# Patient Record
Sex: Female | Born: 1950 | Race: Black or African American | Hispanic: No | Smoking: Current every day smoker
Health system: Southern US, Community
[De-identification: ages and names within clinical notes are randomized; demographics above are authoritative.]

## PROBLEM LIST (undated history)

## (undated) DIAGNOSIS — Z86010 Personal history of colonic polyps: Secondary | ICD-10-CM

## (undated) DIAGNOSIS — R7881 Bacteremia: Secondary | ICD-10-CM

## (undated) DIAGNOSIS — IMO0002 Reserved for concepts with insufficient information to code with codable children: Secondary | ICD-10-CM

## (undated) DIAGNOSIS — C801 Malignant (primary) neoplasm, unspecified: Secondary | ICD-10-CM

## (undated) DIAGNOSIS — I10 Essential (primary) hypertension: Secondary | ICD-10-CM

## (undated) DIAGNOSIS — F1911 Other psychoactive substance abuse, in remission: Secondary | ICD-10-CM

## (undated) DIAGNOSIS — K922 Gastrointestinal hemorrhage, unspecified: Secondary | ICD-10-CM

## (undated) DIAGNOSIS — M549 Dorsalgia, unspecified: Secondary | ICD-10-CM

## (undated) DIAGNOSIS — M1990 Unspecified osteoarthritis, unspecified site: Secondary | ICD-10-CM

## (undated) DIAGNOSIS — D62 Acute posthemorrhagic anemia: Secondary | ICD-10-CM

## (undated) DIAGNOSIS — B961 Klebsiella pneumoniae [K. pneumoniae] as the cause of diseases classified elsewhere: Secondary | ICD-10-CM

## (undated) DIAGNOSIS — K859 Acute pancreatitis without necrosis or infection, unspecified: Secondary | ICD-10-CM

## (undated) DIAGNOSIS — I81 Portal vein thrombosis: Secondary | ICD-10-CM

## (undated) DIAGNOSIS — F329 Major depressive disorder, single episode, unspecified: Secondary | ICD-10-CM

## (undated) DIAGNOSIS — R578 Other shock: Secondary | ICD-10-CM

## (undated) DIAGNOSIS — D696 Thrombocytopenia, unspecified: Secondary | ICD-10-CM

## (undated) DIAGNOSIS — M810 Age-related osteoporosis without current pathological fracture: Secondary | ICD-10-CM

## (undated) DIAGNOSIS — F32A Depression, unspecified: Secondary | ICD-10-CM

## (undated) DIAGNOSIS — IMO0001 Reserved for inherently not codable concepts without codable children: Secondary | ICD-10-CM

## (undated) DIAGNOSIS — J9601 Acute respiratory failure with hypoxia: Secondary | ICD-10-CM

## (undated) HISTORY — DX: Other psychoactive substance abuse, in remission: F19.11

## (undated) HISTORY — DX: Dorsalgia, unspecified: M54.9

## (undated) HISTORY — PX: LUMBAR EPIDURAL INJECTION: SHX1980

## (undated) HISTORY — DX: Depression, unspecified: F32.A

## (undated) HISTORY — DX: Essential (primary) hypertension: I10

## (undated) HISTORY — DX: Reserved for concepts with insufficient information to code with codable children: IMO0002

## (undated) HISTORY — DX: Reserved for inherently not codable concepts without codable children: IMO0001

## (undated) HISTORY — DX: Unspecified osteoarthritis, unspecified site: M19.90

## (undated) HISTORY — DX: Age-related osteoporosis without current pathological fracture: M81.0

## (undated) HISTORY — DX: Major depressive disorder, single episode, unspecified: F32.9

## (undated) HISTORY — DX: Personal history of colonic polyps: Z86.010

## (undated) HISTORY — PX: COLONOSCOPY: SHX174

---

## 1998-07-25 ENCOUNTER — Encounter: Payer: Self-pay | Admitting: Emergency Medicine

## 1998-07-25 ENCOUNTER — Emergency Department (HOSPITAL_COMMUNITY): Admission: EM | Admit: 1998-07-25 | Discharge: 1998-07-25 | Payer: Self-pay | Admitting: Emergency Medicine

## 1999-08-08 ENCOUNTER — Emergency Department (HOSPITAL_COMMUNITY): Admission: EM | Admit: 1999-08-08 | Discharge: 1999-08-08 | Payer: Self-pay | Admitting: Emergency Medicine

## 1999-08-17 ENCOUNTER — Emergency Department (HOSPITAL_COMMUNITY): Admission: EM | Admit: 1999-08-17 | Discharge: 1999-08-17 | Payer: Self-pay | Admitting: Emergency Medicine

## 1999-08-17 ENCOUNTER — Encounter: Payer: Self-pay | Admitting: Emergency Medicine

## 2001-11-24 ENCOUNTER — Emergency Department (HOSPITAL_COMMUNITY): Admission: EM | Admit: 2001-11-24 | Discharge: 2001-11-24 | Payer: Self-pay | Admitting: Emergency Medicine

## 2005-10-27 ENCOUNTER — Emergency Department (HOSPITAL_COMMUNITY): Admission: EM | Admit: 2005-10-27 | Discharge: 2005-10-28 | Payer: Self-pay | Admitting: Emergency Medicine

## 2005-11-24 ENCOUNTER — Emergency Department (HOSPITAL_COMMUNITY): Admission: EM | Admit: 2005-11-24 | Discharge: 2005-11-24 | Payer: Self-pay | Admitting: Emergency Medicine

## 2007-05-03 ENCOUNTER — Ambulatory Visit: Payer: Self-pay | Admitting: Internal Medicine

## 2007-05-05 ENCOUNTER — Ambulatory Visit: Payer: Self-pay | Admitting: Internal Medicine

## 2007-06-09 ENCOUNTER — Encounter (INDEPENDENT_AMBULATORY_CARE_PROVIDER_SITE_OTHER): Payer: Self-pay | Admitting: Nurse Practitioner

## 2007-06-09 ENCOUNTER — Ambulatory Visit: Payer: Self-pay | Admitting: Internal Medicine

## 2007-06-09 LAB — CONVERTED CEMR LAB
ALT: 33 units/L (ref 0–35)
Albumin: 4.1 g/dL (ref 3.5–5.2)
Basophils Absolute: 0 10*3/uL (ref 0.0–0.1)
Chloride: 106 meq/L (ref 96–112)
Creatinine, Ser: 0.83 mg/dL (ref 0.40–1.20)
Eosinophils Absolute: 0.1 10*3/uL (ref 0.0–0.7)
Eosinophils Relative: 2 % (ref 0–5)
Glucose, Bld: 96 mg/dL (ref 70–99)
Hemoglobin: 13.2 g/dL (ref 12.0–15.0)
LDL Cholesterol: 69 mg/dL (ref 0–99)
Lymphocytes Relative: 50 % — ABNORMAL HIGH (ref 12–46)
Lymphs Abs: 2 10*3/uL (ref 0.7–3.3)
MCV: 96.7 fL (ref 78.0–100.0)
Monocytes Relative: 7 % (ref 3–11)
Neutro Abs: 1.7 10*3/uL (ref 1.7–7.7)
Platelets: 202 10*3/uL (ref 150–400)
Potassium: 4.4 meq/L (ref 3.5–5.3)
Total CHOL/HDL Ratio: 2.5
Triglycerides: 135 mg/dL (ref <150)
WBC: 4.1 10*3/uL (ref 4.0–10.5)

## 2007-07-19 ENCOUNTER — Emergency Department (HOSPITAL_COMMUNITY): Admission: EM | Admit: 2007-07-19 | Discharge: 2007-07-19 | Payer: Self-pay | Admitting: Emergency Medicine

## 2007-07-22 ENCOUNTER — Ambulatory Visit: Payer: Self-pay | Admitting: *Deleted

## 2007-09-09 ENCOUNTER — Ambulatory Visit: Payer: Self-pay | Admitting: Family Medicine

## 2007-09-13 ENCOUNTER — Ambulatory Visit (HOSPITAL_COMMUNITY): Admission: RE | Admit: 2007-09-13 | Discharge: 2007-09-13 | Payer: Self-pay | Admitting: Family Medicine

## 2008-04-23 ENCOUNTER — Ambulatory Visit: Payer: Self-pay | Admitting: Internal Medicine

## 2008-04-23 ENCOUNTER — Encounter (INDEPENDENT_AMBULATORY_CARE_PROVIDER_SITE_OTHER): Payer: Self-pay | Admitting: Family Medicine

## 2008-04-23 LAB — CONVERTED CEMR LAB
ALT: 32 units/L (ref 0–35)
AST: 31 units/L (ref 0–37)
BUN: 16 mg/dL (ref 6–23)
CO2: 24 meq/L (ref 19–32)
Creatinine, Ser: 0.75 mg/dL (ref 0.40–1.20)
Glucose, Bld: 90 mg/dL (ref 70–99)
Sodium: 140 meq/L (ref 135–145)
Total Protein: 7.9 g/dL (ref 6.0–8.3)

## 2008-06-28 ENCOUNTER — Ambulatory Visit: Payer: Self-pay | Admitting: Internal Medicine

## 2008-08-20 ENCOUNTER — Ambulatory Visit: Payer: Self-pay | Admitting: Internal Medicine

## 2008-08-27 ENCOUNTER — Ambulatory Visit: Payer: Self-pay | Admitting: Internal Medicine

## 2008-09-07 ENCOUNTER — Encounter (INDEPENDENT_AMBULATORY_CARE_PROVIDER_SITE_OTHER): Payer: Self-pay | Admitting: Internal Medicine

## 2008-09-07 ENCOUNTER — Ambulatory Visit: Payer: Self-pay | Admitting: Internal Medicine

## 2008-09-26 ENCOUNTER — Ambulatory Visit (HOSPITAL_COMMUNITY): Admission: RE | Admit: 2008-09-26 | Discharge: 2008-09-26 | Payer: Self-pay | Admitting: Internal Medicine

## 2008-12-29 ENCOUNTER — Emergency Department (HOSPITAL_COMMUNITY): Admission: EM | Admit: 2008-12-29 | Discharge: 2008-12-29 | Payer: Self-pay | Admitting: Emergency Medicine

## 2009-01-29 ENCOUNTER — Emergency Department (HOSPITAL_COMMUNITY): Admission: EM | Admit: 2009-01-29 | Discharge: 2009-01-29 | Payer: Self-pay | Admitting: Emergency Medicine

## 2009-02-20 ENCOUNTER — Ambulatory Visit: Payer: Self-pay | Admitting: Internal Medicine

## 2009-03-05 ENCOUNTER — Encounter: Admission: RE | Admit: 2009-03-05 | Discharge: 2009-04-03 | Payer: Self-pay | Admitting: Internal Medicine

## 2009-03-15 ENCOUNTER — Ambulatory Visit: Payer: Self-pay | Admitting: Internal Medicine

## 2009-05-29 ENCOUNTER — Ambulatory Visit: Payer: Self-pay | Admitting: Internal Medicine

## 2009-05-30 ENCOUNTER — Encounter (INDEPENDENT_AMBULATORY_CARE_PROVIDER_SITE_OTHER): Payer: Self-pay | Admitting: *Deleted

## 2009-05-30 LAB — CONVERTED CEMR LAB
ALT: 20 units/L (ref 0–35)
Basophils Absolute: 0 10*3/uL (ref 0.0–0.1)
CO2: 20 meq/L (ref 19–32)
Creatinine, Ser: 0.66 mg/dL (ref 0.40–1.20)
Eosinophils Absolute: 0.1 10*3/uL (ref 0.0–0.7)
Eosinophils Relative: 2 % (ref 0–5)
GC Probe Amp, Genital: NEGATIVE
Glucose, Bld: 84 mg/dL (ref 70–99)
HCT: 41.8 % (ref 36.0–46.0)
Hemoglobin: 13.1 g/dL (ref 12.0–15.0)
Lymphocytes Relative: 57 % — ABNORMAL HIGH (ref 12–46)
MCHC: 31.3 g/dL (ref 30.0–36.0)
Monocytes Absolute: 0.4 10*3/uL (ref 0.1–1.0)
Neutro Abs: 1.3 10*3/uL — ABNORMAL LOW (ref 1.7–7.7)
Neutrophils Relative %: 32 % — ABNORMAL LOW (ref 43–77)
RBC: 4.43 M/uL (ref 3.87–5.11)
WBC: 4.1 10*3/uL (ref 4.0–10.5)

## 2009-06-12 ENCOUNTER — Ambulatory Visit: Payer: Self-pay | Admitting: Internal Medicine

## 2009-06-12 LAB — CONVERTED CEMR LAB
HDL: 49 mg/dL (ref >39)
LDL Cholesterol: 153 mg/dL — ABNORMAL HIGH (ref 0–99)
Triglycerides: 115 mg/dL (ref <150)

## 2009-07-15 ENCOUNTER — Ambulatory Visit: Payer: Self-pay | Admitting: Internal Medicine

## 2009-09-24 ENCOUNTER — Encounter (INDEPENDENT_AMBULATORY_CARE_PROVIDER_SITE_OTHER): Payer: Self-pay | Admitting: Internal Medicine

## 2009-09-24 ENCOUNTER — Ambulatory Visit: Payer: Self-pay | Admitting: Family Medicine

## 2009-09-24 LAB — CONVERTED CEMR LAB
Aldosterone, Serum: 4
Basophils Relative: 1 % (ref 0–1)
Eosinophils Relative: 3 % (ref 0–5)
Hemoglobin: 13.5 g/dL (ref 12.0–15.0)
Lymphocytes Relative: 49 % — ABNORMAL HIGH (ref 12–46)
Lymphs Abs: 1.7 10*3/uL (ref 0.7–4.0)
MCV: 92.1 fL (ref 78.0–100.0)
Monocytes Absolute: 0.5 10*3/uL (ref 0.1–1.0)
Neutro Abs: 1.2 10*3/uL — ABNORMAL LOW (ref 1.7–7.7)
Neutrophils Relative %: 33 % — ABNORMAL LOW (ref 43–77)
RBC: 4.56 M/uL (ref 3.87–5.11)
WBC: 3.5 10*3/uL — ABNORMAL LOW (ref 4.0–10.5)

## 2009-11-25 ENCOUNTER — Ambulatory Visit: Payer: Self-pay | Admitting: Internal Medicine

## 2010-01-13 ENCOUNTER — Ambulatory Visit: Payer: Self-pay | Admitting: Internal Medicine

## 2010-02-05 ENCOUNTER — Ambulatory Visit: Payer: Self-pay | Admitting: Internal Medicine

## 2010-04-04 ENCOUNTER — Ambulatory Visit (HOSPITAL_COMMUNITY): Admission: RE | Admit: 2010-04-04 | Discharge: 2010-04-04 | Payer: Self-pay | Admitting: Internal Medicine

## 2010-05-07 ENCOUNTER — Ambulatory Visit: Payer: Self-pay | Admitting: Internal Medicine

## 2010-08-06 ENCOUNTER — Ambulatory Visit: Payer: Self-pay | Admitting: Internal Medicine

## 2010-08-18 ENCOUNTER — Ambulatory Visit (HOSPITAL_COMMUNITY): Admission: RE | Admit: 2010-08-18 | Payer: Self-pay | Admitting: Internal Medicine

## 2010-11-10 ENCOUNTER — Emergency Department (HOSPITAL_COMMUNITY)
Admission: EM | Admit: 2010-11-10 | Discharge: 2010-11-10 | Disposition: A | Discharge status: Home / Self Care | Payer: Medicaid Other | Attending: Emergency Medicine | Admitting: Emergency Medicine

## 2010-11-10 DIAGNOSIS — F329 Major depressive disorder, single episode, unspecified: Secondary | ICD-10-CM | POA: Insufficient documentation

## 2010-11-10 DIAGNOSIS — Z79899 Other long term (current) drug therapy: Secondary | ICD-10-CM | POA: Insufficient documentation

## 2010-11-10 DIAGNOSIS — R300 Dysuria: Secondary | ICD-10-CM | POA: Insufficient documentation

## 2010-11-10 DIAGNOSIS — M169 Osteoarthritis of hip, unspecified: Secondary | ICD-10-CM | POA: Insufficient documentation

## 2010-11-10 DIAGNOSIS — F3289 Other specified depressive episodes: Secondary | ICD-10-CM | POA: Insufficient documentation

## 2010-11-10 DIAGNOSIS — S335XXA Sprain of ligaments of lumbar spine, initial encounter: Secondary | ICD-10-CM | POA: Insufficient documentation

## 2010-11-10 DIAGNOSIS — X58XXXA Exposure to other specified factors, initial encounter: Secondary | ICD-10-CM | POA: Insufficient documentation

## 2010-11-10 DIAGNOSIS — M1610 Unilateral primary osteoarthritis, unspecified hip: Secondary | ICD-10-CM | POA: Insufficient documentation

## 2010-11-10 DIAGNOSIS — Y929 Unspecified place or not applicable: Secondary | ICD-10-CM | POA: Insufficient documentation

## 2010-11-10 DIAGNOSIS — M810 Age-related osteoporosis without current pathological fracture: Secondary | ICD-10-CM | POA: Insufficient documentation

## 2010-11-10 LAB — URINALYSIS, ROUTINE W REFLEX MICROSCOPIC
Bilirubin Urine: NEGATIVE
Hgb urine dipstick: NEGATIVE
Nitrite: NEGATIVE
Specific Gravity, Urine: 1.021 (ref 1.005–1.030)
Urine Glucose, Fasting: NEGATIVE mg/dL
Urobilinogen, UA: 1 mg/dL (ref 0.0–1.0)
pH: 6 (ref 5.0–8.0)

## 2011-04-20 ENCOUNTER — Other Ambulatory Visit (HOSPITAL_COMMUNITY): Payer: Self-pay | Admitting: Family Medicine

## 2011-04-20 DIAGNOSIS — Z1231 Encounter for screening mammogram for malignant neoplasm of breast: Secondary | ICD-10-CM

## 2011-05-01 ENCOUNTER — Ambulatory Visit (HOSPITAL_COMMUNITY): Payer: Self-pay

## 2011-05-01 ENCOUNTER — Ambulatory Visit (HOSPITAL_COMMUNITY): Admission: RE | Admit: 2011-05-01 | Payer: Self-pay | Source: Ambulatory Visit

## 2011-05-07 ENCOUNTER — Ambulatory Visit: Payer: Medicaid Other

## 2011-05-08 ENCOUNTER — Other Ambulatory Visit (HOSPITAL_COMMUNITY): Payer: Self-pay

## 2011-05-08 ENCOUNTER — Ambulatory Visit (HOSPITAL_COMMUNITY): Payer: Medicaid Other

## 2011-05-14 ENCOUNTER — Ambulatory Visit (HOSPITAL_COMMUNITY): Payer: Medicaid Other

## 2011-05-14 ENCOUNTER — Ambulatory Visit (HOSPITAL_COMMUNITY): Admission: RE | Admit: 2011-05-14 | Payer: Self-pay | Source: Ambulatory Visit

## 2011-05-15 ENCOUNTER — Ambulatory Visit: Payer: Medicaid Other

## 2011-05-21 ENCOUNTER — Ambulatory Visit (HOSPITAL_COMMUNITY): Payer: Medicaid Other

## 2011-05-21 ENCOUNTER — Inpatient Hospital Stay (HOSPITAL_COMMUNITY): Admission: RE | Admit: 2011-05-21 | Payer: Self-pay | Source: Ambulatory Visit

## 2011-10-16 ENCOUNTER — Other Ambulatory Visit: Payer: Self-pay | Admitting: Family Medicine

## 2011-10-16 ENCOUNTER — Other Ambulatory Visit (HOSPITAL_COMMUNITY)
Admission: RE | Admit: 2011-10-16 | Discharge: 2011-10-16 | Disposition: A | Discharge status: Home / Self Care | Payer: Medicaid Other | Source: Ambulatory Visit | Attending: Family Medicine | Admitting: Family Medicine

## 2011-10-16 DIAGNOSIS — Z01419 Encounter for gynecological examination (general) (routine) without abnormal findings: Secondary | ICD-10-CM | POA: Insufficient documentation

## 2012-05-17 ENCOUNTER — Emergency Department (HOSPITAL_COMMUNITY)
Admission: EM | Admit: 2012-05-17 | Discharge: 2012-05-17 | Disposition: A | Discharge status: Home / Self Care | Payer: Medicaid Other | Attending: Emergency Medicine | Admitting: Emergency Medicine

## 2012-05-17 ENCOUNTER — Encounter (HOSPITAL_COMMUNITY): Payer: Self-pay | Admitting: Emergency Medicine

## 2012-05-17 ENCOUNTER — Emergency Department (HOSPITAL_COMMUNITY): Payer: Medicaid Other

## 2012-05-17 DIAGNOSIS — M47812 Spondylosis without myelopathy or radiculopathy, cervical region: Secondary | ICD-10-CM | POA: Insufficient documentation

## 2012-05-17 DIAGNOSIS — F17200 Nicotine dependence, unspecified, uncomplicated: Secondary | ICD-10-CM | POA: Insufficient documentation

## 2012-05-17 DIAGNOSIS — M792 Neuralgia and neuritis, unspecified: Secondary | ICD-10-CM

## 2012-05-17 MED ORDER — HYDROCODONE-ACETAMINOPHEN 5-325 MG PO TABS: 1.0000 | ORAL_TABLET | Freq: Four times a day (QID) | ORAL | Status: AC | PRN

## 2012-05-17 MED ORDER — HYDROCODONE-ACETAMINOPHEN 5-325 MG PO TABS
1.0000 | ORAL_TABLET | Freq: Once | ORAL | Status: AC
  Administered 2012-05-17: 1 via ORAL
  Filled 2012-05-17: qty 1

## 2012-05-17 NOTE — ED Provider Notes (Signed)
History     CSN: [id number]  Arrival date & time 05/17/12  1407   First MD Initiated Contact with Patient 05/17/12 1503      Chief Complaint  Patient presents with  . Torticollis  . lump on back     (Consider location/radiation/quality/duration/timing/severity/associated sxs/prior treatment) HPI Comments: Patient with no significant past medical history presents emergency department with a chief complaint of right neck pain.  Onset of symptoms began in mid June and symptoms have been gradually worsening.  Patient states that she assumed it would get better on her own and when it didn't she decided to get evaluated.  Patient is currently being treated with naproxen and Flexeril with only temporary minimal relief.  Pain is described as starting in the right side of her neck and radiating down her inner arm to her fingertips.  Pain is described as a tingling pins and needles sensation with a severity of a 6/10.  Patient denies any weakness of the extremity, loss of sensation, or inability to perform range of motion, fever, night sweats, chills, or injury.  No other complaints at this time.  The history is provided by the patient.    History reviewed. No pertinent past medical history.  History reviewed. No pertinent past surgical history.  No family history on file.  History  Substance Use Topics  . Smoking status: Current Everyday Smoker -- 0.5 packs/day for 40 years    Types: Cigarettes  . Smokeless tobacco: Never Used  . Alcohol Use: Yes     occasionally    OB History    Grav Para Term Preterm Abortions TAB SAB Ect Mult Living                  Review of Systems  Constitutional: Negative for fever, diaphoresis and activity change.  HENT: Negative for congestion and neck pain.   Respiratory: Negative for cough.   Genitourinary: Negative for dysuria.  Musculoskeletal: Positive for myalgias.  Skin: Negative for color change and wound.  Neurological: Negative for  headaches.  All other systems reviewed and are negative.    Allergies  Review of patient's allergies indicates no known allergies.  Home Medications   Current Outpatient Rx  Name Route Sig Dispense Refill  . CALCIUM CARBONATE-VITAMIN D 500-200 MG-UNIT PO TABS Oral Take 1 tablet by mouth 2 (two) times daily.    . CYCLOBENZAPRINE HCL 10 MG PO TABS Oral Take 20 mg by mouth daily.    Marland Kitchen NAPROXEN 500 MG PO TABS Oral Take 500 mg by mouth 2 (two) times daily with a meal.    . NAPROXEN SODIUM 220 MG PO TABS Oral Take 440 mg by mouth 2 (two) times daily as needed. For pain.    Marland Kitchen QUETIAPINE FUMARATE 25 MG PO TABS Oral Take 25-50 mg by mouth at bedtime as needed. For sleep.    . SERTRALINE HCL 100 MG PO TABS Oral Take 100 mg by mouth daily.      BP 136/79  Pulse 84  Temp 97.8 F (36.6 C) (Oral)  Resp 18  SpO2 99%  Physical Exam  Nursing note and vitals reviewed. Constitutional: She appears well-developed and well-nourished. No distress.  HENT:  Head: Normocephalic and atraumatic.  Eyes: Conjunctivae and EOM are normal.  Neck: Normal range of motion. Neck supple.         No spinous process ttp or bony step offs. Mild pain with ROM. No nuchal rigidity or cervical lymphadenopathy.    Cardiovascular:  Intact distal pulses, capillary refill < 3 seconds  Musculoskeletal:       All extremities with normal ROM. ttp over lateral and medial epicondyle. Strength intact  Neurological:       No sensory deficit. Strength 5/5 bilaterally (biceps, triceps, wrist, MCP, digits)   Skin: She is not diaphoretic.       Skin intact, no tenting    ED Course  Procedures (including critical care time)  Labs Reviewed - No data to display Dg Cervical Spine With Flex & Extend  05/17/2012  *RADIOLOGY REPORT*  Clinical Data: The cervical radiculopathy.  Pain extending into the right upper extremity.  CERVICAL SPINE COMPLETE WITH FLEXION AND EXTENSION VIEWS  Comparison: MRI of the cervical spine  09/13/2007.  Findings: The cervical spine is visualized from skull base through the cervicothoracic junction.  There is significant loss of disc height at C5-6 and C6-7.  The prevertebral soft tissues are within normal limits.  Flexion and extension images demonstrate no abnormal motion.  Uncovertebral disease leads to osseous foraminal narrowing bilaterally at C5-6 and C6-7.  The lung apices are clear.  No acute fracture or traumatic subluxation is evident.  IMPRESSION:  1.  Moderate spondylosis of the cervical spine is again noted be most evident at C5-6 and C6-7. 2.  No abnormal motion on flexion or extension. 3.  No acute abnormality.   Original Report Authenticated By: Jamesetta Orleans. MATTERN, M.D.      No diagnosis found.     MDM  Cervical spondylosis  Pt presents to the ER with neck pain that is radiating down arm. There has been no recent trauma. Imaging reviewed w out evidence of fracture or dislocation. Pt has been previously evaluated by their PCP and is currently taking Naproxen & Flexeril.  No evidence of cervical nerve root compression on physical exam. DC w conservative home therapies (ie heat), advise to cont meds per PCP, & addition of short narcotic script. Advised f-u w orthopedics if s/s persist.          Jaci Carrel, PA-C 05/17/12 1706

## 2012-05-17 NOTE — ED Notes (Signed)
Pt presents with right neck pain that radiates down right arm, sts tingling in right fingers; pain is not associated with movement, pulse present/strong in extremity; pt denies any type of injury or chest pain.  Pt also c/o a lump in the middle of her back to the right of the spine, denies any pain associated with the lump.

## 2012-05-18 NOTE — ED Provider Notes (Signed)
Medical screening examination/treatment/procedure(s) were performed by non-physician practitioner and as supervising physician I was immediately available for consultation/collaboration.  Chayanne Speir R. Beyla Loney, MD 05/18/12 0007 

## 2012-06-28 ENCOUNTER — Other Ambulatory Visit (HOSPITAL_COMMUNITY): Payer: Self-pay | Admitting: Internal Medicine

## 2012-06-28 DIAGNOSIS — Z1231 Encounter for screening mammogram for malignant neoplasm of breast: Secondary | ICD-10-CM

## 2012-07-21 ENCOUNTER — Ambulatory Visit (HOSPITAL_COMMUNITY)
Admission: RE | Admit: 2012-07-21 | Discharge: 2012-07-21 | Disposition: A | Discharge status: Home / Self Care | Payer: Medicaid Other | Source: Ambulatory Visit | Attending: Internal Medicine | Admitting: Internal Medicine

## 2012-07-21 DIAGNOSIS — Z1231 Encounter for screening mammogram for malignant neoplasm of breast: Secondary | ICD-10-CM | POA: Insufficient documentation

## 2013-03-03 ENCOUNTER — Other Ambulatory Visit (HOSPITAL_COMMUNITY): Payer: Self-pay | Admitting: Internal Medicine

## 2013-03-03 DIAGNOSIS — Q782 Osteopetrosis: Secondary | ICD-10-CM

## 2013-03-03 DIAGNOSIS — Z1231 Encounter for screening mammogram for malignant neoplasm of breast: Secondary | ICD-10-CM

## 2013-03-10 ENCOUNTER — Ambulatory Visit (HOSPITAL_COMMUNITY)
Admission: RE | Admit: 2013-03-10 | Discharge: 2013-03-10 | Disposition: A | Discharge status: Home / Self Care | Payer: Medicaid Other | Source: Ambulatory Visit | Attending: Internal Medicine | Admitting: Internal Medicine

## 2013-03-20 ENCOUNTER — Ambulatory Visit (HOSPITAL_COMMUNITY): Admission: RE | Admit: 2013-03-20 | Payer: Medicaid Other | Source: Ambulatory Visit

## 2013-03-23 ENCOUNTER — Ambulatory Visit (HOSPITAL_COMMUNITY)
Admission: RE | Admit: 2013-03-23 | Discharge: 2013-03-23 | Disposition: A | Discharge status: Home / Self Care | Payer: Medicaid Other | Source: Ambulatory Visit | Attending: Internal Medicine | Admitting: Internal Medicine

## 2013-03-23 DIAGNOSIS — Q782 Osteopetrosis: Secondary | ICD-10-CM

## 2013-03-23 DIAGNOSIS — Z780 Asymptomatic menopausal state: Secondary | ICD-10-CM | POA: Insufficient documentation

## 2013-03-23 DIAGNOSIS — Z13820 Encounter for screening for osteoporosis: Secondary | ICD-10-CM | POA: Insufficient documentation

## 2013-07-26 ENCOUNTER — Ambulatory Visit (HOSPITAL_COMMUNITY): Payer: Medicaid Other | Attending: Internal Medicine

## 2013-08-21 ENCOUNTER — Encounter: Payer: Self-pay | Admitting: Internal Medicine

## 2013-10-12 ENCOUNTER — Ambulatory Visit (AMBULATORY_SURGERY_CENTER): Payer: Self-pay | Admitting: *Deleted

## 2013-10-12 VITALS — Ht 63.0 in | Wt 149.4 lb

## 2013-10-12 DIAGNOSIS — Z1211 Encounter for screening for malignant neoplasm of colon: Secondary | ICD-10-CM

## 2013-10-12 MED ORDER — NA SULFATE-K SULFATE-MG SULF 17.5-3.13-1.6 GM/177ML PO SOLN: 1.0000 | Freq: Once | ORAL | Status: DC

## 2013-10-12 NOTE — Progress Notes (Signed)
Denies allergies to eggs or soy products. Denies complications with sedation or anesthesia. 

## 2013-10-26 ENCOUNTER — Ambulatory Visit (AMBULATORY_SURGERY_CENTER): Payer: Medicaid Other | Admitting: Internal Medicine

## 2013-10-26 ENCOUNTER — Encounter: Payer: Self-pay | Admitting: Internal Medicine

## 2013-10-26 VITALS — BP 161/76 | HR 53 | Temp 97.5°F | Resp 14 | Ht 63.0 in | Wt 149.0 lb

## 2013-10-26 DIAGNOSIS — Z860100 Personal history of colon polyps, unspecified: Secondary | ICD-10-CM | POA: Insufficient documentation

## 2013-10-26 DIAGNOSIS — D126 Benign neoplasm of colon, unspecified: Secondary | ICD-10-CM

## 2013-10-26 DIAGNOSIS — K5730 Diverticulosis of large intestine without perforation or abscess without bleeding: Secondary | ICD-10-CM

## 2013-10-26 DIAGNOSIS — Z1211 Encounter for screening for malignant neoplasm of colon: Secondary | ICD-10-CM

## 2013-10-26 HISTORY — DX: Personal history of colon polyps, unspecified: Z86.0100

## 2013-10-26 HISTORY — DX: Personal history of colonic polyps: Z86.010

## 2013-10-26 MED ORDER — SODIUM CHLORIDE 0.9 % IV SOLN: 500.0000 mL | INTRAVENOUS | Status: DC

## 2013-10-26 NOTE — Patient Instructions (Addendum)
I found and removed one tiny polyp that looks benign (not cancer). You also have a condition called diverticulosis - common and not usually a problem. Please read the handout provided.  I will let you know pathology results and when to have another routine colonoscopy by mail.  I appreciate the opportunity to care for you. Gatha Mayer, MD, [facility]  Discharge instructions given with verbal understanding. Handouts on polyps and diverticulosis. Resume previous medications. YOU HAD AN ENDOSCOPIC PROCEDURE TODAY AT [facility]: Refer to the procedure report that was given to you for any specific questions about what was found during the examination.  If the procedure report does not answer your questions, please call your gastroenterologist to clarify.  If you requested that your care partner not be given the details of your procedure findings, then the procedure report has been included in a sealed envelope for you to review at your convenience later.  YOU SHOULD EXPECT: Some feelings of bloating in the abdomen. Passage of more gas than usual.  Walking can help get rid of the air that was put into your GI tract during the procedure and reduce the bloating. If you had a lower endoscopy (such as a colonoscopy or flexible sigmoidoscopy) you may notice spotting of blood in your stool or on the toilet paper. If you underwent a bowel prep for your procedure, then you may not have a normal bowel movement for a few days.  DIET: Your first meal following the procedure should be a light meal and then it is ok to progress to your normal diet.  A half-sandwich or bowl of soup is an example of a good first meal.  Heavy or fried foods are harder to digest and may make you feel nauseous or bloated.  Likewise meals heavy in dairy and vegetables can cause extra gas to form and this can also increase the bloating.  Drink plenty of fluids but you should avoid alcoholic beverages for 24 hours.  ACTIVITY:  Your care partner should take you home directly after the procedure.  You should plan to take it easy, moving slowly for the rest of the day.  You can resume normal activity the day after the procedure however you should NOT DRIVE or use heavy machinery for 24 hours (because of the sedation medicines used during the test).    SYMPTOMS TO REPORT IMMEDIATELY: A gastroenterologist can be reached at any hour.  During normal business hours, 8:30 AM to 5:00 PM Monday through Friday, call [phone].  After hours and on weekends, please call the GI answering service at [phone] who will take a message and have the physician on call contact you.   Following lower endoscopy (colonoscopy or flexible sigmoidoscopy):  Excessive amounts of blood in the stool  Significant tenderness or worsening of abdominal pains  Swelling of the abdomen that is new, acute  Fever of 100F or higher FOLLOW UP: If any biopsies were taken you will be contacted by phone or by letter within the next 1-3 weeks.  Call your gastroenterologist if you have not heard about the biopsies in 3 weeks.  Our staff will call the home number listed on your records the next business day following your procedure to check on you and address any questions or concerns that you may have at that time regarding the information given to you following your procedure. This is a courtesy call and so if there is no answer at the home number and  we have not heard from you through the emergency physician on call, we will assume that you have returned to your regular daily activities without incident.  SIGNATURES/CONFIDENTIALITY: You and/or your care partner have signed paperwork which will be entered into your electronic medical record.  These signatures attest to the fact that that the information above on your After Visit Summary has been reviewed and is understood.  Full responsibility of the confidentiality of this discharge information lies with  you and/or your care-partner.

## 2013-10-26 NOTE — Progress Notes (Signed)
Report to pacu rn, vss, bbs=clear 

## 2013-10-26 NOTE — Progress Notes (Signed)
Called to room to assist during endoscopic procedure.  Patient ID and intended procedure confirmed with present staff. Received instructions for my participation in the procedure from the performing physician.  

## 2013-10-26 NOTE — Op Note (Signed)
[place]  [address] [place], [address]   COLONOSCOPY PROCEDURE REPORT  PATIENT: Melinda Conrad, Melinda Conrad.  MR#: [id number] BIRTHDATE: 10/19/1950 , 62  yrs. old GENDER: Female ENDOSCOPIST: Gatha Mayer, MD, [facility] PROCEDURE DATE:  10/26/2013 PROCEDURE:   Colonoscopy with snare polypectomy First Screening Colonoscopy - Avg.  risk and is 50 yrs.  old or older Yes.  Prior Negative Screening - Now for repeat screening. N/A  History of Adenoma - Now for follow-up colonoscopy & has been > or = to 3 yrs.  N/A  Polyps Removed Today? Yes. ASA CLASS:   Class II INDICATIONS:average risk screening and first colonoscopy. MEDICATIONS: propofol (Diprivan) 300mg  IV, MAC sedation, administered by CRNA, and These medications were titrated to patient response per physician's verbal order  DESCRIPTION OF PROCEDURE:   After the risks benefits and alternatives of the procedure were thoroughly explained, informed consent was obtained.  A digital rectal exam revealed no abnormalities of the rectum.   The LB [id number]  endoscope was introduced through the anus and advanced to the cecum, which was identified by both the appendix and ileocecal valve. No adverse events experienced.   The quality of the prep was Suprep adequate The instrument was then slowly withdrawn as the colon was fully examined.   COLON FINDINGS: A diminutive sessile polyp was found in the sigmoid colon.  A polypectomy was performed with a cold snare.  The resection was complete and the polyp tissue was completely retrieved.   There was mild scattered diverticulosis noted throughout the entire examined colon.   The colon mucosa was otherwise normal.   A right colon retroflexion was performed. Retroflexed views revealed no abnormalities. The time to cecum=3 minutes 45 seconds.  Withdrawal time=11 minutes 03 seconds.  The scope was withdrawn and the procedure completed. COMPLICATIONS: There were no  complications.  ENDOSCOPIC IMPRESSION: 1.   Diminutive sessile polyp was found in the sigmoid colon; polypectomy was performed with a cold snare 2.   There was mild diverticulosis noted throughout the entire examined colon 3.   The colon mucosa was otherwise normal - adequate prep - first colonoscopy  RECOMMENDATIONS: Timing of repeat colonoscopy will be determined by pathology findings.   eSigned:  Gatha Mayer, MD, [facility] 10/26/2013 10:30 AM   cc: The Patient

## 2013-10-27 ENCOUNTER — Telehealth: Payer: Self-pay | Admitting: *Deleted

## 2013-10-27 NOTE — Telephone Encounter (Signed)
  Follow up Call-  Call back number 10/26/2013  Post procedure Call Back phone  # [phone]  Permission to leave phone message Yes     Patient questions:  Do you have a fever, pain , or abdominal swelling? no Pain Score  0 *  Have you tolerated food without any problems? yes  Have you been able to return to your normal activities? yes  Do you have any questions about your discharge instructions: Diet   no Medications  no Follow up visit  no  Do you have questions or concerns about your Care? no  Actions: * If pain score is 4 or above: No action needed, pain <4.

## 2013-11-01 ENCOUNTER — Encounter: Payer: Self-pay | Admitting: Internal Medicine

## 2013-11-01 NOTE — Progress Notes (Signed)
Quick Note:  Diminutive adenoma - adequate prep - repeat colon 2020 ______

## 2014-03-04 ENCOUNTER — Emergency Department (HOSPITAL_COMMUNITY): Payer: Medicaid Other

## 2014-03-04 ENCOUNTER — Encounter (HOSPITAL_COMMUNITY): Payer: Self-pay | Admitting: Emergency Medicine

## 2014-03-04 ENCOUNTER — Inpatient Hospital Stay (HOSPITAL_COMMUNITY)
Admission: EM | Admit: 2014-03-04 | Discharge: 2014-03-07 | DRG: 439 | Disposition: A | Discharge status: Home / Self Care | Payer: Medicaid Other | Attending: Internal Medicine | Admitting: Internal Medicine

## 2014-03-04 ENCOUNTER — Inpatient Hospital Stay (HOSPITAL_COMMUNITY): Payer: Medicaid Other

## 2014-03-04 DIAGNOSIS — K8689 Other specified diseases of pancreas: Secondary | ICD-10-CM | POA: Diagnosis present

## 2014-03-04 DIAGNOSIS — K838 Other specified diseases of biliary tract: Secondary | ICD-10-CM

## 2014-03-04 DIAGNOSIS — F17200 Nicotine dependence, unspecified, uncomplicated: Secondary | ICD-10-CM | POA: Diagnosis present

## 2014-03-04 DIAGNOSIS — F3289 Other specified depressive episodes: Secondary | ICD-10-CM | POA: Diagnosis present

## 2014-03-04 DIAGNOSIS — F1021 Alcohol dependence, in remission: Secondary | ICD-10-CM

## 2014-03-04 DIAGNOSIS — Z86010 Personal history of colonic polyps: Secondary | ICD-10-CM

## 2014-03-04 DIAGNOSIS — F32A Depression, unspecified: Secondary | ICD-10-CM | POA: Diagnosis present

## 2014-03-04 DIAGNOSIS — R1011 Right upper quadrant pain: Secondary | ICD-10-CM

## 2014-03-04 DIAGNOSIS — K831 Obstruction of bile duct: Secondary | ICD-10-CM

## 2014-03-04 DIAGNOSIS — R17 Unspecified jaundice: Secondary | ICD-10-CM | POA: Diagnosis present

## 2014-03-04 DIAGNOSIS — K5730 Diverticulosis of large intestine without perforation or abscess without bleeding: Secondary | ICD-10-CM | POA: Diagnosis present

## 2014-03-04 DIAGNOSIS — F329 Major depressive disorder, single episode, unspecified: Secondary | ICD-10-CM | POA: Diagnosis present

## 2014-03-04 DIAGNOSIS — B1920 Unspecified viral hepatitis C without hepatic coma: Secondary | ICD-10-CM | POA: Diagnosis present

## 2014-03-04 DIAGNOSIS — I498 Other specified cardiac arrhythmias: Secondary | ICD-10-CM | POA: Diagnosis present

## 2014-03-04 DIAGNOSIS — E876 Hypokalemia: Secondary | ICD-10-CM | POA: Diagnosis present

## 2014-03-04 DIAGNOSIS — K869 Disease of pancreas, unspecified: Principal | ICD-10-CM | POA: Diagnosis present

## 2014-03-04 DIAGNOSIS — R030 Elevated blood-pressure reading, without diagnosis of hypertension: Secondary | ICD-10-CM | POA: Diagnosis present

## 2014-03-04 DIAGNOSIS — C259 Malignant neoplasm of pancreas, unspecified: Secondary | ICD-10-CM | POA: Insufficient documentation

## 2014-03-04 LAB — URINALYSIS, ROUTINE W REFLEX MICROSCOPIC
Glucose, UA: NEGATIVE mg/dL
HGB URINE DIPSTICK: NEGATIVE
Ketones, ur: NEGATIVE mg/dL
LEUKOCYTES UA: NEGATIVE
NITRITE: NEGATIVE
PROTEIN: NEGATIVE mg/dL
SPECIFIC GRAVITY, URINE: 1.005 (ref 1.005–1.030)
Urobilinogen, UA: 0.2 mg/dL (ref 0.0–1.0)
pH: 5.5 (ref 5.0–8.0)

## 2014-03-04 LAB — COMPREHENSIVE METABOLIC PANEL
ALBUMIN: 3.4 g/dL — AB (ref 3.5–5.2)
ALT: 93 U/L — ABNORMAL HIGH (ref 0–35)
AST: 131 U/L — AB (ref 0–37)
Alkaline Phosphatase: 569 U/L — ABNORMAL HIGH (ref 39–117)
BILIRUBIN TOTAL: 18.3 mg/dL — AB (ref 0.3–1.2)
BUN: 9 mg/dL (ref 6–23)
CO2: 22 mEq/L (ref 19–32)
CREATININE: 0.49 mg/dL — AB (ref 0.50–1.10)
Calcium: 9.9 mg/dL (ref 8.4–10.5)
Chloride: 101 mEq/L (ref 96–112)
GFR calc Af Amer: >90 mL/min (ref >90)
GFR calc non Af Amer: >90 mL/min (ref >90)
Glucose, Bld: 82 mg/dL (ref 70–99)
Potassium: 3.1 mEq/L — ABNORMAL LOW (ref 3.7–5.3)
Sodium: 139 mEq/L (ref 137–147)
TOTAL PROTEIN: 9.6 g/dL — AB (ref 6.0–8.3)

## 2014-03-04 LAB — CBC WITH DIFFERENTIAL/PLATELET
BASOS ABS: 0 10*3/uL (ref 0.0–0.1)
BASOS PCT: 0 % (ref 0–1)
EOS ABS: 0.1 10*3/uL (ref 0.0–0.7)
Eosinophils Relative: 1 % (ref 0–5)
HCT: 37.2 % (ref 36.0–46.0)
Hemoglobin: 13 g/dL (ref 12.0–15.0)
Lymphocytes Relative: 32 % (ref 12–46)
Lymphs Abs: 1.9 10*3/uL (ref 0.7–4.0)
MCH: 31 pg (ref 26.0–34.0)
MCHC: 34.9 g/dL (ref 30.0–36.0)
MCV: 88.8 fL (ref 78.0–100.0)
MONO ABS: 0.3 10*3/uL (ref 0.1–1.0)
MONOS PCT: 6 % (ref 3–12)
NEUTROS PCT: 61 % (ref 43–77)
Neutro Abs: 3.6 10*3/uL (ref 1.7–7.7)
Platelets: 202 10*3/uL (ref 150–400)
RBC: 4.19 MIL/uL (ref 3.87–5.11)
RDW: 17.5 % — ABNORMAL HIGH (ref 11.5–15.5)
WBC: 5.8 10*3/uL (ref 4.0–10.5)

## 2014-03-04 LAB — PROTIME-INR
INR: 1.11 (ref 0.00–1.49)
Prothrombin Time: 14.1 seconds (ref 11.6–15.2)

## 2014-03-04 LAB — I-STAT TROPONIN, ED: TROPONIN I, POC: 0 ng/mL (ref 0.00–0.08)

## 2014-03-04 LAB — LIPASE, BLOOD: LIPASE: 32 U/L (ref 11–59)

## 2014-03-04 MED ORDER — ONDANSETRON HCL 4 MG/2ML IJ SOLN: 4.0000 mg | Freq: Four times a day (QID) | INTRAMUSCULAR | Status: DC | PRN

## 2014-03-04 MED ORDER — IOHEXOL 300 MG/ML  SOLN
100.0000 mL | Freq: Once | INTRAMUSCULAR | Status: AC | PRN
  Administered 2014-03-04: 100 mL via INTRAVENOUS

## 2014-03-04 MED ORDER — SODIUM CHLORIDE 0.9 % IV SOLN
INTRAVENOUS | Status: AC
  Administered 2014-03-04: 21:00:00 via INTRAVENOUS

## 2014-03-04 MED ORDER — MORPHINE SULFATE 2 MG/ML IJ SOLN
1.0000 mg | INTRAMUSCULAR | Status: DC | PRN
  Administered 2014-03-04 – 2014-03-06 (×7): 1 mg via INTRAVENOUS
  Filled 2014-03-04 (×8): qty 1

## 2014-03-04 MED ORDER — POTASSIUM CHLORIDE IN NACL 20-0.9 MEQ/L-% IV SOLN
INTRAVENOUS | Status: AC
  Administered 2014-03-04: 22:00:00 via INTRAVENOUS
  Filled 2014-03-04 (×2): qty 1000

## 2014-03-04 MED ORDER — THIAMINE HCL 100 MG/ML IJ SOLN
100.0000 mg | Freq: Every day | INTRAMUSCULAR | Status: DC
  Administered 2014-03-04: 100 mg via INTRAVENOUS
  Filled 2014-03-04: qty 1

## 2014-03-04 MED ORDER — SODIUM CHLORIDE 0.9 % IV BOLUS (SEPSIS)
1000.0000 mL | Freq: Once | INTRAVENOUS | Status: AC
  Administered 2014-03-04: 1000 mL via INTRAVENOUS

## 2014-03-04 MED ORDER — IOHEXOL 300 MG/ML  SOLN
25.0000 mL | Freq: Once | INTRAMUSCULAR | Status: AC | PRN
  Administered 2014-03-04: 25 mL via ORAL

## 2014-03-04 MED ORDER — MORPHINE SULFATE 4 MG/ML IJ SOLN
6.0000 mg | Freq: Once | INTRAMUSCULAR | Status: AC
  Administered 2014-03-04: 6 mg via INTRAVENOUS
  Filled 2014-03-04: qty 2

## 2014-03-04 MED ORDER — ONDANSETRON HCL 4 MG PO TABS: 4.0000 mg | ORAL_TABLET | Freq: Four times a day (QID) | ORAL | Status: DC | PRN

## 2014-03-04 MED ORDER — ACETAMINOPHEN 650 MG RE SUPP: 650.0000 mg | Freq: Four times a day (QID) | RECTAL | Status: DC | PRN

## 2014-03-04 MED ORDER — ACETAMINOPHEN 325 MG PO TABS: 650.0000 mg | ORAL_TABLET | Freq: Four times a day (QID) | ORAL | Status: DC | PRN

## 2014-03-04 MED ORDER — ONDANSETRON HCL 4 MG/2ML IJ SOLN: 4.0000 mg | Freq: Three times a day (TID) | INTRAMUSCULAR | Status: DC | PRN

## 2014-03-04 MED ORDER — HYDRALAZINE HCL 20 MG/ML IJ SOLN
10.0000 mg | INTRAMUSCULAR | Status: DC | PRN
  Administered 2014-03-04 – 2014-03-05 (×2): 10 mg via INTRAVENOUS
  Filled 2014-03-04 (×2): qty 1

## 2014-03-04 NOTE — ED Provider Notes (Signed)
CSN: [id number]     Arrival date & time 03/04/14  1356 History   First MD Initiated Contact with Patient 03/04/14 1626     Chief Complaint  Patient presents with  . Abdominal Pain     (Consider location/radiation/quality/duration/timing/severity/associated sxs/prior Treatment) Patient is a 63 y.o. female presenting with abdominal pain.  Abdominal Pain Pain location:  RUQ Pain quality: aching, fullness and sharp   Pain radiates to:  Back Pain severity:  Mild Onset quality:  Gradual Duration:  3 weeks Progression:  Worsening Chronicity:  New Context: not alcohol use, not retching and not trauma   Relieved by:  None tried Worsened by:  Nothing tried Ineffective treatments:  NSAIDs Associated symptoms: no belching, no chest pain, no cough, no dysuria, no fever, no melena, no nausea and no shortness of breath     Past Medical History  Diagnosis Date  . Arthritis   . Depression   . H/O: substance abuse   . Back pain   . Personal history of colonic polyp - adenoma 10/26/2013    10/26/2013 diminutive sigmoid polyp removed     Past Surgical History  Procedure Laterality Date  . Lumbar epidural injection     Family History  Problem Relation Age of Onset  . Colon cancer Neg Hx   . Esophageal cancer Neg Hx   . Rectal cancer Neg Hx   . Stomach cancer Neg Hx   . CAD Mother   . Cancer Father    History  Substance Use Topics  . Smoking status: Current Every Day Smoker -- 0.25 packs/day for 40 years    Types: Cigarettes  . Smokeless tobacco: Never Used  . Alcohol Use: Yes     Comment: occasionally   OB History   Grav Para Term Preterm Abortions TAB SAB Ect Mult Living                 Review of Systems  Constitutional: Negative for fever and activity change.  HENT: Negative for congestion and facial swelling.   Eyes: Negative for discharge and redness.       Scleral icterus  Respiratory: Negative for cough and shortness of breath.   Cardiovascular: Negative for chest  pain and palpitations.  Gastrointestinal: Positive for abdominal pain (RUQ). Negative for nausea, melena and abdominal distention.  Endocrine: Negative for polydipsia and polyuria.  Genitourinary: Negative for dysuria and menstrual problem.  Musculoskeletal: Negative for back pain and joint swelling.  Skin: Negative for color change and wound.  Neurological: Negative for dizziness, light-headedness and headaches.      Allergies  Review of patient's allergies indicates no known allergies.  Home Medications   Prior to Admission medications   Medication Sig Start Date End Date Taking? Authorizing Provider  ibuprofen (ADVIL,MOTRIN) 800 MG tablet Take 1,600 mg by mouth daily.   Yes Historical Provider, MD  omeprazole (PRILOSEC) 20 MG capsule Take 40 mg by mouth daily.   Yes Historical Provider, MD   BP 178/89  Pulse 59  Temp(Src) 98.2 F (36.8 C) (Oral)  Resp 17  Ht 5\' 3"  (1.6 m)  Wt 145 lb (65.772 kg)  BMI 25.69 kg/m2  SpO2 96% Physical Exam  Nursing note and vitals reviewed. Constitutional: She is oriented to person, place, and time. She appears well-developed and well-nourished.  HENT:  Head: Normocephalic and atraumatic.  Eyes: Conjunctivae and EOM are normal. Right eye exhibits no discharge. Left eye exhibits no discharge.  Cardiovascular: Normal rate and regular rhythm.   Pulmonary/Chest:  Effort normal and breath sounds normal. No respiratory distress.  Abdominal: Soft. She exhibits no distension. There is tenderness (RUQ w/ hepatomegaly). There is no rebound and no guarding.  Musculoskeletal: Normal range of motion. She exhibits no edema and no tenderness.  Neurological: She is alert and oriented to person, place, and time.  Skin: Skin is warm and dry.    ED Course  Procedures (including critical care time) Labs Review Labs Reviewed  CBC WITH DIFFERENTIAL - Abnormal; Notable for the following:    RDW 17.5 (*)    All other components within normal limits   COMPREHENSIVE METABOLIC PANEL - Abnormal; Notable for the following:    Potassium 3.1 (*)    Creatinine, Ser 0.49 (*)    Total Protein 9.6 (*)    Albumin 3.4 (*)    AST 131 (*)    ALT 93 (*)    Alkaline Phosphatase 569 (*)    Total Bilirubin 18.3 (*)    All other components within normal limits  URINALYSIS, ROUTINE W REFLEX MICROSCOPIC - Abnormal; Notable for the following:    Color, Urine AMBER (*)    Bilirubin Urine LARGE (*)    All other components within normal limits  GLUCOSE, CAPILLARY - Abnormal; Notable for the following:    Glucose-Capillary 115 (*)    All other components within normal limits  LIPASE, BLOOD  PROTIME-INR  HEPATITIS PANEL, ACUTE  HEPATIC FUNCTION PANEL  LIPASE, BLOOD  PROTIME-INR  BASIC METABOLIC PANEL  CBC  I-STAT TROPOININ, ED    Imaging Review Dg Chest 2 View  03/04/2014   CLINICAL DATA:  Infiltrates.  EXAM: CHEST  2 VIEW  COMPARISON:  None.  FINDINGS: Cardiopericardial silhouette within normal limits. Mediastinal contours normal. Trachea midline. No airspace disease or effusion.  IMPRESSION: No acute cardiopulmonary disease.   Electronically Signed   By: Dereck Ligas M.D.   On: 03/04/2014 23:25   Ct Abdomen Pelvis W Contrast  03/04/2014   CLINICAL DATA:  Abdominal pain, jaundice.  EXAM: CT ABDOMEN AND PELVIS WITH CONTRAST  TECHNIQUE: Multidetector CT imaging of the abdomen and pelvis was performed using the standard protocol following bolus administration of intravenous contrast.  CONTRAST:  117mL OMNIPAQUE IOHEXOL 300 MG/ML  SOLN  COMPARISON:  None.  FINDINGS: Dependent atelectasis in the lung bases. Heart is borderline in size. No effusions.  There is intrahepatic and extrahepatic biliary ductal dilatation. Pancreatic ductal dilatation also noted in the pancreatic head and body. Pancreatic duct measures up to 11 mm. Common bile duct measures up to 14 mm. Ill-defined low-density area noted in the region of the pancreatic head turning for pancreatic  head mass measuring approximately 2.4 cm on image 33 of series 201.  No focal hepatic abnormality. Spleen, adrenals, gallbladder and kidneys are unremarkable. Uterus, and adnexa and urinary bladder are unremarkable. Small amount of free fluid in the pelvis. Stomach, large and small bowel grossly unremarkable.  Aorta and iliac vessels are non aneurysmal. No free air or adenopathy.  No acute bony abnormality. Degenerative disc and facet disease in the lower lumbar spine.  IMPRESSION: Intrahepatic and extrahepatic biliary ductal dilatation. Dilated pancreatic duct. Suggestion of ill-defined mass in the region of the pancreatic head, approximately 2.4 cm. Findings concerning for pancreatic cancer. Recommend further evaluation with MRI of the abdomen with and without contrast.  Small amount of free fluid in the pelvis.  These results were called by telephone at the time of interpretation on 03/04/2014 at 7:30 PM to Dr. Merrily Pew ,  who verbally acknowledged these results.   Electronically Signed   By: Rolm Baptise M.D.   On: 03/04/2014 19:32     EKG Interpretation None      MDM   Final diagnoses:  Pancreatic cancer  Jaundice  Bilirubinemia  Right upper quadrant abdominal pain  Dilated cbd, acquired    63 yo F w/ h/o scleral icterus and liver dysfunction here with worsening RUQ abdomianl pain over the last few weeks. Seen previously for same and referred to [facility] where she has appt 6/22. Pain bad so came here. No n/v and fevers. Increased BM's, yellowness increased in urine. Uses crack and alcohol intermittently, NO IV drug use. No known hepatitis. On exam with significant scleral icterus, not able to appreciate jaundice. Hepatomegaly and RUQ ttp, non surgical abdomen otherwise.  Labs already performed, added on hepatitis panel and INR. Will give pain meds and fluids.  BUS with evidence of ductal dilation and sludge in her gall bladder. CT added on to eval for cause of obstruction and fouhd to have  pancreatic head mass. GI and Oncology consulted and Gi recommended ERCP in AM, admit to medicine so was admitted. Pain controlled, tolerating PO.     Merrily Pew, MD 03/05/14 [phone]

## 2014-03-04 NOTE — H&P (Addendum)
[facility] History and Physical  Melinda AZPEITIA ZOX:[id number] DOB: October 10, 1950 DOA: 03/04/2014  Referring physician:  ER physician. PCP: Pcp Not In System Dr. Marlou Sa.  Chief Complaint: Jaundice and right upper quadrant pain.  HPI: Melinda Conrad is a 63 y.o. female with history of depression started experiencing yellow discoloration of her sclera with right upper quadrant pain 3 weeks ago. Her PCP had referred her to gastroenterologist and has appointment on June 22. Since patient's pain worsened patient came to the ER. Labs reveal jaundice with elevated LFTs concerning for obstructive type. CT abdomen and pelvis shows intrahepatic and extrahepatic ductal dilation with possible pancreatic mass. On-call gastroenterologist was consulted by the physician. They'll be seeing patient in consult for possible ERCP. Patient has been admitted for further management. Patient denies any fever chills. Has some nausea. Denies any vomiting. Denies any diarrhea. Denies any chest pain or shortness of breath. Patient has been having poor appetite weight loss over the last one month. Patient states she used to drink a lot of alcohol before many years ago but now she only drinks on weekend and last drink was 3 days ago.  Review of Systems: As presented in the history of presenting illness, rest negative.  Past Medical History  Diagnosis Date  . Arthritis   . Depression   . H/O: substance abuse   . Back pain   . Personal history of colonic polyp - adenoma 10/26/2013    10/26/2013 diminutive sigmoid polyp removed     Past Surgical History  Procedure Laterality Date  . Lumbar epidural injection     Social History:  reports that she has been smoking Cigarettes.  She has a 10 pack-year smoking history. She has never used smokeless tobacco. She reports that she drinks alcohol. She reports that she does not use illicit drugs. Where does patient live home. Can patient participate in ADLs? Yes.  No Known  Allergies  Family History:  Family History  Problem Relation Age of Onset  . Colon cancer Neg Hx   . Esophageal cancer Neg Hx   . Rectal cancer Neg Hx   . Stomach cancer Neg Hx   . CAD Mother   . Cancer Father       Prior to Admission medications   Medication Sig Start Date End Date Taking? Authorizing Provider  ibuprofen (ADVIL,MOTRIN) 800 MG tablet Take 1,600 mg by mouth daily.   Yes Historical Provider, MD  omeprazole (PRILOSEC) 20 MG capsule Take 40 mg by mouth daily.   Yes Historical Provider, MD    Physical Exam: Filed Vitals:   03/04/14 1846 03/04/14 1931 03/04/14 2000 03/04/14 2045  BP: 161/77 189/80 175/71   Pulse: 50 49 47 54  Temp:      TempSrc:      Resp: 22 18 12 16   SpO2: 99%  98% 99%     General:  Well-developed and moderately nourished.  Eyes: icterus present no pallor.  ENT: No discharge from the ears eyes nose mouth.  Neck: No mass felt.  Cardiovascular: S1-S2 heard.  Respiratory: No rhonchi crepitations.  Abdomen: Tenderness in the right upper quadrant.  Skin: No rash.  Musculoskeletal: No edema.  Psychiatric: Appears normal.  Neurologic: A oriented to time place and person. Moves all extremities.  Labs on Admission:  Basic Metabolic Panel:  Recent Labs Lab 03/04/14 1510  NA 139  K 3.1*  CL 101  CO2 22  GLUCOSE 82  BUN 9  CREATININE 0.49*  CALCIUM 9.9  Liver Function Tests:  Recent Labs Lab 03/04/14 1510  AST 131*  ALT 93*  ALKPHOS 569*  BILITOT 18.3*  PROT 9.6*  ALBUMIN 3.4*    Recent Labs Lab 03/04/14 1510  LIPASE 32   No results found for this basename: AMMONIA,  in the last 168 hours CBC:  Recent Labs Lab 03/04/14 1510  WBC 5.8  NEUTROABS 3.6  HGB 13.0  HCT 37.2  MCV 88.8  PLT 202   Cardiac Enzymes: No results found for this basename: CKTOTAL, CKMB, CKMBINDEX, TROPONINI,  in the last 168 hours  BNP (last 3 results) No results found for this basename: PROBNP,  in the last 8760 hours CBG: No  results found for this basename: GLUCAP,  in the last 168 hours  Radiological Exams on Admission: Ct Abdomen Pelvis W Contrast  03/04/2014   CLINICAL DATA:  Abdominal pain, jaundice.  EXAM: CT ABDOMEN AND PELVIS WITH CONTRAST  TECHNIQUE: Multidetector CT imaging of the abdomen and pelvis was performed using the standard protocol following bolus administration of intravenous contrast.  CONTRAST:  179mL OMNIPAQUE IOHEXOL 300 MG/ML  SOLN  COMPARISON:  None.  FINDINGS: Dependent atelectasis in the lung bases. Heart is borderline in size. No effusions.  There is intrahepatic and extrahepatic biliary ductal dilatation. Pancreatic ductal dilatation also noted in the pancreatic head and body. Pancreatic duct measures up to 11 mm. Common bile duct measures up to 14 mm. Ill-defined low-density area noted in the region of the pancreatic head turning for pancreatic head mass measuring approximately 2.4 cm on image 33 of series 201.  No focal hepatic abnormality. Spleen, adrenals, gallbladder and kidneys are unremarkable. Uterus, and adnexa and urinary bladder are unremarkable. Small amount of free fluid in the pelvis. Stomach, large and small bowel grossly unremarkable.  Aorta and iliac vessels are non aneurysmal. No free air or adenopathy.  No acute bony abnormality. Degenerative disc and facet disease in the lower lumbar spine.  IMPRESSION: Intrahepatic and extrahepatic biliary ductal dilatation. Dilated pancreatic duct. Suggestion of ill-defined mass in the region of the pancreatic head, approximately 2.4 cm. Findings concerning for pancreatic cancer. Recommend further evaluation with MRI of the abdomen with and without contrast.  Small amount of free fluid in the pelvis.  These results were called by telephone at the time of interpretation on 03/04/2014 at 7:30 PM to Dr. Merrily Pew , who verbally acknowledged these results.   Electronically Signed   By: Rolm Baptise M.D.   On: 03/04/2014 19:32    EKG: Independently  reviewed. Sinus bradycardia. Presently monitor shows heart rate of 62 beats per minute.  Assessment/Plan Principal Problem:   Jaundice Active Problems:   Pancreatic mass   Depression   1. Jaundice with pancreatic mass - pancreatic mass is concerning for pancreatic cancer. On-call gastroenterologist was already consulted and at this time we will keep patient n.p.o. in anticipation of possible ERCP. Follow LFTs and INR. When necessary pain relief medication. Check hepatitis panel. 2. Elevated blood pressure - keep patient on when necessary IV hydralazine for systolic blood pressure more than 160. Follow blood pressure trends. 3. History of alcoholism - patient states that she used to drink a lot of alcohol previously but has decreased substantially many years ago. Now a days patient only drinks on weekends. 4. Tobacco abuse - advised to quit smoking. Chest x-ray is pending. 5. History of depression - presently n.p.o. Patient usually takes Seroquel. 6. Sinus bradycardia - presently rate is around 62 beats per minute.  Code Status: Full code.  Family Communication: None.  Disposition Plan: Admit to inpatient.    Prestyn Stanco N. [facility] Pager [phone].  If 7PM-7AM, please contact night-coverage [email or web address] Password [email or web address] 03/04/2014, 8:50 PM

## 2014-03-04 NOTE — ED Notes (Signed)
Pt is here with complaints of pain to right upper abdominal area.  Pt has appointment to see someone about her liver and has jaundiced eyes.  Pt reports that she used to drink etoh twice a week.  No vomiting.

## 2014-03-04 NOTE — ED Notes (Signed)
Transporting patient to new room assignment. 

## 2014-03-04 NOTE — ED Notes (Signed)
Pt finished with contrast. RN aware

## 2014-03-04 NOTE — ED Notes (Signed)
Pt aware that urine sample is needed.  

## 2014-03-04 NOTE — Progress Notes (Signed)
Report taken from Maudie Mercury, [place] in ED.

## 2014-03-04 NOTE — ED Notes (Signed)
CRITICAL VALUE ALERT  Critical value received:  Bilirubin 18.3  Date of notification:  03-04-2014  Time of notification:  1611  Critical value read back:yes  Nurse who received alert:  MG Braulio Conte, RN  MD notified (1st page): Dr. Tawnya Crook  Time of first page:  1611  MD notified (2nd page):  Time of second page:  Responding MD: Dr. Tawnya Crook  Time MD responded:  [phone]

## 2014-03-04 NOTE — ED Notes (Signed)
Pt brought back to room with family in tow; pt getting undressed and into a gown at this time; Darrell, EMT present in room

## 2014-03-05 ENCOUNTER — Inpatient Hospital Stay (HOSPITAL_COMMUNITY): Payer: Medicaid Other

## 2014-03-05 ENCOUNTER — Encounter (HOSPITAL_COMMUNITY): Payer: Self-pay | Admitting: Internal Medicine

## 2014-03-05 DIAGNOSIS — C259 Malignant neoplasm of pancreas, unspecified: Secondary | ICD-10-CM

## 2014-03-05 DIAGNOSIS — K838 Other specified diseases of biliary tract: Secondary | ICD-10-CM

## 2014-03-05 LAB — BASIC METABOLIC PANEL
BUN: 7 mg/dL (ref 6–23)
CHLORIDE: 104 meq/L (ref 96–112)
CO2: 21 mEq/L (ref 19–32)
CREATININE: 0.49 mg/dL — AB (ref 0.50–1.10)
Calcium: 9.2 mg/dL (ref 8.4–10.5)
GFR calc non Af Amer: >90 mL/min (ref >90)
GLUCOSE: 84 mg/dL (ref 70–99)
Potassium: 3.9 mEq/L (ref 3.7–5.3)
Sodium: 140 mEq/L (ref 137–147)

## 2014-03-05 LAB — HEPATIC FUNCTION PANEL
ALT: 70 U/L — ABNORMAL HIGH (ref 0–35)
AST: 102 U/L — ABNORMAL HIGH (ref 0–37)
Albumin: 2.6 g/dL — ABNORMAL LOW (ref 3.5–5.2)
Alkaline Phosphatase: 469 U/L — ABNORMAL HIGH (ref 39–117)
BILIRUBIN DIRECT: 11.8 mg/dL — AB (ref 0.0–0.3)
Indirect Bilirubin: 4.4 mg/dL — ABNORMAL HIGH (ref 0.3–0.9)
Total Bilirubin: 16.2 mg/dL — ABNORMAL HIGH (ref 0.3–1.2)
Total Protein: 8 g/dL (ref 6.0–8.3)

## 2014-03-05 LAB — PROTIME-INR
INR: 1.16 (ref 0.00–1.49)
PROTHROMBIN TIME: 14.6 s (ref 11.6–15.2)

## 2014-03-05 LAB — GLUCOSE, CAPILLARY
Glucose-Capillary: 115 mg/dL — ABNORMAL HIGH (ref 70–99)
Glucose-Capillary: 74 mg/dL (ref 70–99)
Glucose-Capillary: 80 mg/dL (ref 70–99)

## 2014-03-05 LAB — HEPATITIS PANEL, ACUTE
HCV AB: REACTIVE — AB
HEP B S AG: NEGATIVE
Hep A IgM: NONREACTIVE
Hep B C IgM: NONREACTIVE

## 2014-03-05 LAB — CBC
HEMATOCRIT: 33.3 % — AB (ref 36.0–46.0)
Hemoglobin: 11.3 g/dL — ABNORMAL LOW (ref 12.0–15.0)
MCH: 30.5 pg (ref 26.0–34.0)
MCHC: 33.9 g/dL (ref 30.0–36.0)
MCV: 89.8 fL (ref 78.0–100.0)
Platelets: 170 10*3/uL (ref 150–400)
RBC: 3.71 MIL/uL — ABNORMAL LOW (ref 3.87–5.11)
RDW: 17.5 % — AB (ref 11.5–15.5)
WBC: 5.1 10*3/uL (ref 4.0–10.5)

## 2014-03-05 LAB — LIPASE, BLOOD: Lipase: 22 U/L (ref 11–59)

## 2014-03-05 MED ORDER — LORAZEPAM 2 MG/ML IJ SOLN: 1.0000 mg | Freq: Four times a day (QID) | INTRAMUSCULAR | Status: DC | PRN

## 2014-03-05 MED ORDER — PNEUMOCOCCAL VAC POLYVALENT 25 MCG/0.5ML IJ INJ
0.5000 mL | INJECTION | INTRAMUSCULAR | Status: DC
  Filled 2014-03-05: qty 0.5

## 2014-03-05 MED ORDER — SODIUM CHLORIDE 0.9 % IV SOLN
INTRAVENOUS | Status: DC
  Administered 2014-03-05: 20 mL/h via INTRAVENOUS

## 2014-03-05 MED ORDER — THIAMINE HCL 100 MG/ML IJ SOLN
100.0000 mg | Freq: Every day | INTRAMUSCULAR | Status: DC
  Filled 2014-03-05: qty 1

## 2014-03-05 MED ORDER — ENSURE COMPLETE PO LIQD
237.0000 mL | Freq: Two times a day (BID) | ORAL | Status: DC
  Administered 2014-03-05 – 2014-03-07 (×3): 237 mL via ORAL

## 2014-03-05 MED ORDER — VITAMIN B-1 100 MG PO TABS
100.0000 mg | ORAL_TABLET | Freq: Every day | ORAL | Status: DC
  Administered 2014-03-05: 100 mg via ORAL
  Filled 2014-03-05: qty 1

## 2014-03-05 MED ORDER — FOLIC ACID 1 MG PO TABS
1.0000 mg | ORAL_TABLET | Freq: Every day | ORAL | Status: DC
  Administered 2014-03-05: 1 mg via ORAL
  Filled 2014-03-05: qty 1

## 2014-03-05 MED ORDER — SODIUM CHLORIDE 0.9 % IV SOLN
3.0000 g | Freq: Once | INTRAVENOUS | Status: AC
  Administered 2014-03-05: 3 g via INTRAVENOUS
  Filled 2014-03-05: qty 3

## 2014-03-05 MED ORDER — GADOBENATE DIMEGLUMINE 529 MG/ML IV SOLN
15.0000 mL | Freq: Once | INTRAVENOUS | Status: AC
  Administered 2014-03-05: 14 mL via INTRAVENOUS

## 2014-03-05 MED ORDER — LORAZEPAM 1 MG PO TABS: 1.0000 mg | ORAL_TABLET | Freq: Four times a day (QID) | ORAL | Status: DC | PRN

## 2014-03-05 MED ORDER — ADULT MULTIVITAMIN W/MINERALS CH
1.0000 | ORAL_TABLET | Freq: Every day | ORAL | Status: DC
  Administered 2014-03-05 – 2014-03-07 (×2): 1 via ORAL
  Filled 2014-03-05 (×3): qty 1

## 2014-03-05 NOTE — Progress Notes (Signed)
PATIENT DETAILS Name: Melinda Conrad Age: 63 y.o. Sex: female Date of Birth: Aug 09, 1951 Admit Date: 03/04/2014 Admitting Physician Rise Patience, MD PCP:Pcp Not In System  Subjective: No major complaints  Assessment/Plan: Principal Problem:  Obstructive Jaundice - Secondary to pancreatic mass - Seen by gastroenterology, ERCP tomorrow.  Active Problems:   Pancreatic mass - As above.    Depression - Currently stable.  Tobacco abuse - Counseled extensively  History of EtOH use - Signs of withdrawal, monitor for now. If any signs, start Ativan per CIWA protocol.  Disposition: Remain inpatient  DVT Prophylaxis: SCD's  Code Status: Full code  Family Communication None  Procedures:  None  CONSULTS:  GI  Time spent 40 minutes-which includes 50% of the time with face-to-face with patient/ family and coordinating care related to the above assessment and plan.    MEDICATIONS: Scheduled Meds: . ampicillin-sulbactam (UNASYN) IVPB 3 g  3 g Intravenous Once  . feeding supplement (ENSURE COMPLETE)  237 mL Oral BID  . multivitamin with minerals  1 tablet Oral Daily  . pneumococcal 23 valent vaccine  0.5 mL Intramuscular Tomorrow-1000   Continuous Infusions: . sodium chloride 20 mL/hr (03/05/14 1332)  . 0.9 % NaCl with KCl 20 mEq / L 75 mL/hr at 03/04/14 2141   PRN Meds:.acetaminophen, acetaminophen, hydrALAZINE, LORazepam, LORazepam, morphine injection, ondansetron (ZOFRAN) IV, ondansetron  Antibiotics: Anti-infectives   Start     Dose/Rate Route Frequency Ordered Stop   03/05/14 1230  Ampicillin-Sulbactam (UNASYN) 3 g in sodium chloride 0.9 % 100 mL IVPB     3 g 100 mL/hr over 60 Minutes Intravenous  Once 03/05/14 1150         PHYSICAL EXAM: Vital signs in last 24 hours: Filed Vitals:   03/04/14 2048 03/04/14 2148 03/05/14 0541 03/05/14 1355  BP: 188/72 178/89 160/65 170/79  Pulse: 54 59 50 55  Temp:  98.2 F (36.8 C) 97.5 F (36.4  C) 97.4 F (36.3 C)  TempSrc:  Oral Oral Oral  Resp: 17 17 18 18   Height:  5\' 3"  (1.6 m)    Weight:  65.772 kg (145 lb)    SpO2:  96% 98% 97%    Weight change:  Filed Weights   03/04/14 2148  Weight: 65.772 kg (145 lb)   Body mass index is 25.69 kg/(m^2).   Gen Exam: Awake and alert with clear speech.  +scleral icterus Neck: Supple, No JVD.   Chest: B/L Clear.   CVS: S1 S2 Regular, no murmurs.  Abdomen: soft, BS +, non tender, non distended.  Extremities: no edema, lower extremities warm to touch. Neurologic: Non Focal.   Skin: No Rash.   Wounds: N/A.    Intake/Output from previous day:  Intake/Output Summary (Last 24 hours) at 03/05/14 1424 Last data filed at 03/05/14 0544  Gross per 24 hour  Intake   1000 ml  Output    600 ml  Net    400 ml     LAB RESULTS: CBC  Recent Labs Lab 03/04/14 1510 03/05/14 0619  WBC 5.8 5.1  HGB 13.0 11.3*  HCT 37.2 33.3*  PLT 202 170  MCV 88.8 89.8  MCH 31.0 30.5  MCHC 34.9 33.9  RDW 17.5* 17.5*  LYMPHSABS 1.9  --   MONOABS 0.3  --   EOSABS 0.1  --   BASOSABS 0.0  --     Chemistries   Recent Labs Lab 03/04/14 1510 03/05/14 0619  NA  139 140  K 3.1* 3.9  CL 101 104  CO2 22 21  GLUCOSE 82 84  BUN 9 7  CREATININE 0.49* 0.49*  CALCIUM 9.9 9.2    CBG:  Recent Labs Lab 03/05/14 0008 03/05/14 0618 03/05/14 1214  GLUCAP 115* 74 80    GFR Estimated Creatinine Clearance: 68.2 ml/min (by C-G formula based on Cr of 0.49).  Coagulation profile  Recent Labs Lab 03/04/14 2135 03/05/14 0619  INR 1.11 1.16    Cardiac Enzymes No results found for this basename: CK, CKMB, TROPONINI, MYOGLOBIN,  in the last 168 hours  No components found with this basename: POCBNP,  No results found for this basename: DDIMER,  in the last 72 hours No results found for this basename: HGBA1C,  in the last 72 hours No results found for this basename: CHOL, HDL, LDLCALC, TRIG, CHOLHDL, LDLDIRECT,  in the last 72 hours No  results found for this basename: TSH, T4TOTAL, FREET3, T3FREE, THYROIDAB,  in the last 72 hours No results found for this basename: VITAMINB12, FOLATE, FERRITIN, TIBC, IRON, RETICCTPCT,  in the last 72 hours  Recent Labs  03/04/14 1510 03/05/14 0619  LIPASE 32 22    Urine Studies No results found for this basename: UACOL, UAPR, USPG, UPH, UTP, UGL, UKET, UBIL, UHGB, UNIT, UROB, ULEU, UEPI, UWBC, URBC, UBAC, CAST, CRYS, UCOM, BILUA,  in the last 72 hours  MICROBIOLOGY: No results found for this or any previous visit (from the past 240 hour(s)).  RADIOLOGY STUDIES/RESULTS: Dg Chest 2 View  03/04/2014   CLINICAL DATA:  Infiltrates.  EXAM: CHEST  2 VIEW  COMPARISON:  None.  FINDINGS: Cardiopericardial silhouette within normal limits. Mediastinal contours normal. Trachea midline. No airspace disease or effusion.  IMPRESSION: No acute cardiopulmonary disease.   Electronically Signed   By: Dereck Ligas M.D.   On: 03/04/2014 23:25   Ct Abdomen Pelvis W Contrast  03/04/2014   CLINICAL DATA:  Abdominal pain, jaundice.  EXAM: CT ABDOMEN AND PELVIS WITH CONTRAST  TECHNIQUE: Multidetector CT imaging of the abdomen and pelvis was performed using the standard protocol following bolus administration of intravenous contrast.  CONTRAST:  129mL OMNIPAQUE IOHEXOL 300 MG/ML  SOLN  COMPARISON:  None.  FINDINGS: Dependent atelectasis in the lung bases. Heart is borderline in size. No effusions.  There is intrahepatic and extrahepatic biliary ductal dilatation. Pancreatic ductal dilatation also noted in the pancreatic head and body. Pancreatic duct measures up to 11 mm. Common bile duct measures up to 14 mm. Ill-defined low-density area noted in the region of the pancreatic head turning for pancreatic head mass measuring approximately 2.4 cm on image 33 of series 201.  No focal hepatic abnormality. Spleen, adrenals, gallbladder and kidneys are unremarkable. Uterus, and adnexa and urinary bladder are unremarkable.  Small amount of free fluid in the pelvis. Stomach, large and small bowel grossly unremarkable.  Aorta and iliac vessels are non aneurysmal. No free air or adenopathy.  No acute bony abnormality. Degenerative disc and facet disease in the lower lumbar spine.  IMPRESSION: Intrahepatic and extrahepatic biliary ductal dilatation. Dilated pancreatic duct. Suggestion of ill-defined mass in the region of the pancreatic head, approximately 2.4 cm. Findings concerning for pancreatic cancer. Recommend further evaluation with MRI of the abdomen with and without contrast.  Small amount of free fluid in the pelvis.  These results were called by telephone at the time of interpretation on 03/04/2014 at 7:30 PM to Dr. Merrily Pew , who verbally acknowledged these results.  Electronically Signed   By: Rolm Baptise M.D.   On: 03/04/2014 19:32   Mr 3d Recon At Scanner  03/05/2014   CLINICAL DATA:  Obstructive jaundice. Concern for pancreatic mass on recent CT.  EXAM: MRI ABDOMEN WITHOUT AND WITH CONTRAST (INCLUDING MRCP)  TECHNIQUE: Multiplanar multisequence MR imaging of the abdomen was performed both before and after the administration of intravenous contrast. Heavily T2-weighted images of the biliary and pancreatic ducts were obtained, and three-dimensional MRCP images were rendered by post processing.  CONTRAST:  38mL MULTIHANCE GADOBENATE DIMEGLUMINE 529 MG/ML IV SOLN  COMPARISON:  CT 03/04/2014  FINDINGS: Within the head of the pancreas there is a 17 mm x 22 mm lesion which has low signal intensity on the T1 weighted imaging (image 62, series 1500) in relation to the normal high signal in the adjacent normal pancreatic parenchyma. There is dilatation of the common bile duct and pancreatic duct proximal to this obstructing lesion (double duct sign). The degree of common bile duct dilatation measures up to 12 mm diameter. There is mild intrahepatic duct dilatation. The pancreatic duct is dilated up to 8 mm.  The pancreatic  lesion demonstrates mild peripheral enhancement on the more delayed in subtracted contrast series (image 58 series 1150).  The celiac trunk and its branches are not involved with the pancreatic head mass lesion. Likewise the superior mesenteric artery is not involved with the pancreatic lesion. The main portal vein does approximate the superior margin of the a pancreatic lesion over an approximately 16 mm junction (image 55, series 16).  There are no focal hepatic lesions. There are several periportal lymph nodes which are normal in size. For example 7 mm short axis node on image 47, series 1501.  The pancreatic body and tail are atrophic. The spleen, adrenal glands, kidneys are normal. The gallbladder is distended to 45 mm. The stomach and limited view of the small bowel colon are unremarkable. Lung bases are clear. No aggressive osseous lesion  IMPRESSION: 1. Mass lesion in the head of the pancreas measuring up to 2.2 cm which obstructs the pancreatic duct and common bile duct is most concerning for pancreatic adenocarcinoma. 2. Pancreatic mass lesion does not involve the celiac trunk or branches or the superior mesenteric artery. 3. Pancreatic mass does abut the undersurface of the proximal main portal vein. 4. No evidence of hepatic metastasis. 5. Intrahepatic biliary duct dilatation of the gallbladder consistent with biliary obstruction.   Electronically Signed   By: Suzy Bouchard M.D.   On: 03/05/2014 11:50   Mr Abd W/wo Cm/mrcp  03/05/2014   CLINICAL DATA:  Obstructive jaundice. Concern for pancreatic mass on recent CT.  EXAM: MRI ABDOMEN WITHOUT AND WITH CONTRAST (INCLUDING MRCP)  TECHNIQUE: Multiplanar multisequence MR imaging of the abdomen was performed both before and after the administration of intravenous contrast. Heavily T2-weighted images of the biliary and pancreatic ducts were obtained, and three-dimensional MRCP images were rendered by post processing.  CONTRAST:  80mL MULTIHANCE GADOBENATE  DIMEGLUMINE 529 MG/ML IV SOLN  COMPARISON:  CT 03/04/2014  FINDINGS: Within the head of the pancreas there is a 17 mm x 22 mm lesion which has low signal intensity on the T1 weighted imaging (image 62, series 1500) in relation to the normal high signal in the adjacent normal pancreatic parenchyma. There is dilatation of the common bile duct and pancreatic duct proximal to this obstructing lesion (double duct sign). The degree of common bile duct dilatation measures up to 12 mm diameter. There  is mild intrahepatic duct dilatation. The pancreatic duct is dilated up to 8 mm.  The pancreatic lesion demonstrates mild peripheral enhancement on the more delayed in subtracted contrast series (image 58 series 1150).  The celiac trunk and its branches are not involved with the pancreatic head mass lesion. Likewise the superior mesenteric artery is not involved with the pancreatic lesion. The main portal vein does approximate the superior margin of the a pancreatic lesion over an approximately 16 mm junction (image 55, series 16).  There are no focal hepatic lesions. There are several periportal lymph nodes which are normal in size. For example 7 mm short axis node on image 47, series 1501.  The pancreatic body and tail are atrophic. The spleen, adrenal glands, kidneys are normal. The gallbladder is distended to 45 mm. The stomach and limited view of the small bowel colon are unremarkable. Lung bases are clear. No aggressive osseous lesion  IMPRESSION: 1. Mass lesion in the head of the pancreas measuring up to 2.2 cm which obstructs the pancreatic duct and common bile duct is most concerning for pancreatic adenocarcinoma. 2. Pancreatic mass lesion does not involve the celiac trunk or branches or the superior mesenteric artery. 3. Pancreatic mass does abut the undersurface of the proximal main portal vein. 4. No evidence of hepatic metastasis. 5. Intrahepatic biliary duct dilatation of the gallbladder consistent with biliary  obstruction.   Electronically Signed   By: Suzy Bouchard M.D.   On: 03/05/2014 11:50    Oren Binet, MD  [facility] Pager:[phone]  If 7PM-7AM, please contact night-coverage [email or web address] Password [email or web address] 03/05/2014, 2:24 PM   LOS: 1 day   **Disclaimer: This note may have been dictated with voice recognition software. Similar sounding words can inadvertently be transcribed and this note may contain transcription errors which may not have been corrected upon publication of note.**

## 2014-03-05 NOTE — Consult Note (Signed)
[facility] Consult: 11:06 AM 03/05/2014  LOS: 1 day    Referring Provider:  Dr Hal Hope.   Primary Care Physician:  Kevan Ny.  Primary Gastroenterologist:  Dr. Carlean Purl    Reason for Consultation:  Jaundice and pancreatic mass.    HPI: Melinda Conrad is a 63 y.o. female.  Adenomatous sigmoid colon polyp on initial screening colonoscopy in 10/2003.     About 4 to 5 weeks ago went to see Dr Marlou Sa for right upper quadrant pain, colicky in pattern.  Relieved with Ibuprofen but pain persisted, worsened and came to ED last night.  No nausea.  Urine dark for at least 2 weeks.  Scleral icterus for 2 to 3 weeks.  Pale, acholic stools for 2 weeks.  Anorexia x 4 weeks but just 5# weight loss.  Feels a swelling along with the pain in RUQ.  No fever no chills.  No limb swelling.    Had been set to see hepatology at local [facility] 6/22.   Labs show alk phos 560, t bili 18, transaminases 3 to 4 x normal.  Coags normal.  CT shows intra/extra biliary ductal dilatation (14 mm CBD), ~ 2.4 mass at head of pancreas.   MRCP completed, not yet read.   Previous heavy beer drinker, cut back 8 years ago.  Current consumption is 40 oz beer q 3 weeks.  Did snort cocaine in past but no hx injection drug use.  No hx of hepatitis or liver problems.     Past Medical History  Diagnosis Date  . Arthritis   . Depression   . H/O: substance abuse   . Back pain   . Personal history of colonic polyp - adenoma 10/26/2013    10/26/2013 diminutive sigmoid polyp removed      Past Surgical History  Procedure Laterality Date  . Lumbar epidural injection    . Colonoscopy      Prior to Admission medications   Medication Sig Start Date End Date Taking? Authorizing Provider  ibuprofen (ADVIL,MOTRIN) 800 MG tablet Take 1,600 mg by mouth  daily.   Yes Historical Provider, MD  omeprazole (PRILOSEC) 20 MG capsule Take 40 mg by mouth daily.   Yes Historical Provider, MD    Scheduled Meds: . folic acid  1 mg Oral Daily  . multivitamin with minerals  1 tablet Oral Daily  . pneumococcal 23 valent vaccine  0.5 mL Intramuscular Tomorrow-1000  . thiamine  100 mg Oral Daily   Or  . thiamine  100 mg Intravenous Daily   Infusions: . 0.9 % NaCl with KCl 20 mEq / L 75 mL/hr at 03/04/14 2141   PRN Meds: acetaminophen, acetaminophen, hydrALAZINE, LORazepam, LORazepam, morphine injection, ondansetron (ZOFRAN) IV, ondansetron   Allergies as of 03/04/2014  . (No Known Allergies)    Family History  Problem Relation Age of Onset  . Colon cancer Neg Hx   . Esophageal cancer Neg Hx   . Rectal cancer Neg Hx   . Stomach cancer Neg Hx   . CAD Mother   .  Cancer Father   . Cirrhosis Sister     she is a heavy drinker.        History   Social History  . Marital Status: Legally Separated    Spouse Name: N/A    Number of Children: N/A  . Years of Education: As of spring 2015, working on [employer].    Occupational History  . Unemployed as of 02/2014.    Social History Main Topics  . Smoking status: Current Every Day Smoker -- 0.25 packs/day for 40 years    Types: Cigarettes  . Smokeless tobacco: Never Used  . Alcohol Use: Yes     Comment: occasionally  . Drug Use: No  . Sexual Activity: Yes   Other Topics Concern  . Not on file   Social History Narrative  . Lives alone.      REVIEW OF SYSTEMS: Constitutional:  Per HPI ENT:  No nose bleeds Pulm:  No cough or dyspnea CV:  No palpitations, no LE edema.  GU:  No hematuria, no frequency GI:  Per HPI Heme:  No hx anemia   Transfusions:  None ever. Neuro:  No headaches, no peripheral tingling or numbness Derm:  No rash or sores. Itchy underneath eyes but not on trunk or limbs.  Endocrine:  No sweats or chills.  No polyuria or dysuria Immunization:  Pneumovax given 6/16.    Travel:  None beyond local counties in last few months.    PHYSICAL EXAM: Vital signs in last 24 hours: Filed Vitals:   03/05/14 0541  BP: 160/65  Pulse: 50  Temp: 97.5 F (36.4 C)  Resp: 18   Wt Readings from Last 3 Encounters:  03/04/14 65.772 kg (145 lb)  10/26/13 67.586 kg (149 lb)  10/12/13 67.767 kg (149 lb 6.4 oz)   General: pleasant, alert, comfortable AAF  Head:  No asymmetry or swelling  Eyes:  + icterus.  Ears:  Not HOH  Nose:  No congestion, no sneezing Mouth:  Clear, moist. Neck:  No mass, no bruit, no TMG Lungs:  Clear bil Heart: RRR Abdomen:  Firmness in RUQ, mild to moderately tender but no guard or rebound.  Hypoactive BS.  No distention or protuberance.  Scattering of tiny, flat, hyperpigmented spots in LLQ to mid abdomen (none seen elsewhere on trunk or limbs): pt says these are present since childhood.  Rectal: deferred   Musc/Skeltl: no joint contracture or swelling Extremities:  No CCE  Neurologic:  Pleasant, no tremor.  No limb weakness.  Oriented x 3. Good historian Skin:  No sores, no rash Tattoos:  None seen Nodes:  No cervical adenopathy   Psych:  Pleasant, in good spirits. Relaxed.   Intake/Output from previous day: 06/14 0701 - 06/15 0700 In: 1000 [I.V.:1000] Out: 600 [Urine:600] Intake/Output this shift:    LAB RESULTS:  Recent Labs  03/04/14 1510 03/05/14 0619  WBC 5.8 5.1  HGB 13.0 11.3*  HCT 37.2 33.3*  PLT 202 170   BMET Lab Results  Component Value Date   NA 140 03/05/2014   NA 139 03/04/2014   NA 140 09/24/2009   K 3.9 03/05/2014   K 3.1* 03/04/2014   K 4.0 09/24/2009   CL 104 03/05/2014   CL 101 03/04/2014   CL 105 09/24/2009   CO2 21 03/05/2014   CO2 22 03/04/2014   CO2 24 09/24/2009   GLUCOSE 84 03/05/2014   GLUCOSE 82 03/04/2014   GLUCOSE 98 09/24/2009   BUN 7 03/05/2014  BUN 9 03/04/2014   BUN 18 09/24/2009   CREATININE 0.49* 03/05/2014   CREATININE 0.49* 03/04/2014   CREATININE 0.99 09/24/2009   CALCIUM 9.2 03/05/2014    CALCIUM 9.9 03/04/2014   CALCIUM 9.4 09/24/2009   LFT  Recent Labs  03/04/14 1510 03/05/14 0619  PROT 9.6* 8.0  ALBUMIN 3.4* 2.6*  AST 131* 102*  ALT 93* 70*  ALKPHOS 569* 469*  BILITOT 18.3* 16.2*  BILIDIR  --  11.8*  IBILI  --  4.4*   PT/INR Lab Results  Component Value Date   INR 1.16 03/05/2014       PT                          14.6   Hepatitis Panel  Recent Labs  03/04/14 1650  HEPBSAG NEGATIVE  HCVAB Reactive*  HEPAIGM NON REACTIVE  HEPBIGM NON REACTIVE   Lipase     Component Value Date/Time   LIPASE 22 03/05/2014 0619     RADIOLOGY STUDIES: Dg Chest 2 View 03/04/2014   CLINICAL DATA:  Infiltrates.  EXAM: CHEST  2 VIEW  COMPARISON:  None.  FINDINGS: Cardiopericardial silhouette within normal limits. Mediastinal contours normal. Trachea midline. No airspace disease or effusion.  IMPRESSION: No acute cardiopulmonary disease.   Electronically Signed   By: Dereck Ligas M.D.   On: 03/04/2014 23:25   Ct Abdomen Pelvis W Contrast 03/04/2014     COMPARISON:  None.  FINDINGS: Dependent atelectasis in the lung bases. Heart is borderline in size. No effusions.  There is intrahepatic and extrahepatic biliary ductal dilatation. Pancreatic ductal dilatation also noted in the pancreatic head and body. Pancreatic duct measures up to 11 mm. Common bile duct measures up to 14 mm. Ill-defined low-density area noted in the region of the pancreatic head turning for pancreatic head mass measuring approximately 2.4 cm on image 33 of series 201.  No focal hepatic abnormality. Spleen, adrenals, gallbladder and kidneys are unremarkable. Uterus, and adnexa and urinary bladder are unremarkable. Small amount of free fluid in the pelvis. Stomach, large and small bowel grossly unremarkable.  Aorta and iliac vessels are non aneurysmal. No free air or adenopathy.  No acute bony abnormality. Degenerative disc and facet disease in the lower lumbar spine.  IMPRESSION: Intrahepatic and extrahepatic  biliary ductal dilatation. Dilated pancreatic duct. Suggestion of ill-defined mass in the region of the pancreatic head, approximately 2.4 cm. Findings concerning for pancreatic cancer. Recommend further evaluation with MRI of the abdomen with and without contrast.  Small amount of free fluid in the pelvis.  These results were called by telephone at the time of interpretation on 03/04/2014 at 7:30 PM to Dr. Merrily Pew , who verbally acknowledged these results.   Electronically Signed   By: Rolm Baptise M.D.   On: 03/04/2014 19:32    ENDOSCOPIC STUDIES: 10/26/2013  Colonoscopy  Dr Carlean Purl First Screening Colonoscopy - Avg. risk  ENDOSCOPIC IMPRESSION:  1. Diminutive sessile polyp was found in the sigmoid colon;  polypectomy was performed with a cold snare  2. There was mild diverticulosis noted throughout the entire  examined colon  3. The colon mucosa was otherwise normal - adequate prep - first  Colonoscopy Pathology: Tubular adenoma, no HGD  IMPRESSION:   *  Jaundice, RUQ pain and pancreatic head mass.   *  Hep C Ab reactive.  Needs RNA study. .   No evidence cirrhosis on CT or by labs.(normal coags)  *  Tubular adenomatous colon polyp 10/2013.   Rescope due 10/2018    PLAN:     *  ERCP 6/16 at 1400.  *  Await MRCP which is completed.  *  HCV RNA quant and CA 19-9 ordered.  *  Eat today.  Npo again post midnight.    Azucena Freed  03/05/2014, 11:06 AM Pager: [phone]     [facility] Attending  I have also seen and assessed the patient and agree with the above note. Images viewed also. She has obstructive jaundice from pancreatic mass based upon available evidence. HCV a secondary issue MRI shows  IMPRESSION:  1. Mass lesion in the head of the pancreas measuring up to 2.2 cm  which obstructs the pancreatic duct and common bile duct is most  concerning for pancreatic adenocarcinoma.  2. Pancreatic mass lesion does not involve the celiac trunk or  branches or the superior  mesenteric artery.  3. Pancreatic mass does abut the undersurface of the proximal main  portal vein.  4. No evidence of hepatic metastasis.  5. Intrahepatic biliary duct dilatation of the gallbladder  consistent with biliary obstruction.  Abutting portal vein could limit surgical options  Will most likely need an EUS after ERCP/stent  The risks and benefits as well as alternatives of endoscopic procedure(s) have been discussed and reviewed. All questions answered. The patient agrees to proceed.  Gatha Mayer, MD, [facility] [phone] (pager) 03/05/2014 3:20 PM

## 2014-03-05 NOTE — Progress Notes (Signed)
INITIAL NUTRITION ASSESSMENT  DOCUMENTATION CODES Per approved criteria  -Not Applicable   INTERVENTION: Provide Ensure Complete BID Encourage PO intake as tolerated  NUTRITION DIAGNOSIS: Inadequate oral intake related to abdominal pain as evidenced by reported 6% weight loss in less than 4 weeks.  Goal: Pt to meet >/= 90% of their estimated nutrition needs   Monitor:  PO intake, weight trend, labs  Reason for Assessment: Malnutrition Screening Tool, score of3  63 y.o. female  Admitting Dx: Jaundice  ASSESSMENT: 64 y.o. female with history of depression started experiencing yellow discoloration of her sclera with right upper quadrant pain 3 weeks ago. Her PCP had referred her to gastroenterologist and has appointment on June 22. Since patient's pain worsened patient came to the ER. Labs reveal jaundice with elevated LFTs concerning for obstructive type. CT abdomen and pelvis shows intrahepatic and extrahepatic ductal dilation with possible pancreatic mass.   Pt reports that 3-4 weeks ago she was weighing 155 to 160 lbs but, she has lost weight due to abdominal pain with PO intake. Pt states abdominal pain has been with solid food, not with liquids. Pt states that all she ate was a [place] sandwich and some applesauce which is about the amount she has been eating daily for the past 3 weeks. Pt reports having a great appetite today, eating lunch at time of visit.  Encouraged pt to avoid high fat/fried foods to help minimize pain. Pt willing to drink Ensure Complete until PO intake improves.   Labs: elevated alk phos, AST, ALT, and bilirubin  Height: Ht Readings from Last 1 Encounters:  03/04/14 '5\' 3"'  (1.6 m)    Weight: Wt Readings from Last 1 Encounters:  03/04/14 145 lb (65.772 kg)    Ideal Body Weight: 115 lbs  % Ideal Body Weight: 126%  Wt Readings from Last 10 Encounters:  03/04/14 145 lb (65.772 kg)  10/26/13 149 lb (67.586 kg)  10/12/13 149 lb 6.4 oz (67.767 kg)     Usual Body Weight: 155 lbs  % Usual Body Weight: 93.5%  BMI:  Body mass index is 25.69 kg/(m^2).  Estimated Nutritional Needs: Kcal: 1650-1850 Protein: 90-105 grams Fluid: 1.6-1.8 L/day  Skin: WDL  Diet Order: General  EDUCATION NEEDS: -No education needs identified at this time   Intake/Output Summary (Last 24 hours) at 03/05/14 1331 Last data filed at 03/05/14 0544  Gross per 24 hour  Intake   1000 ml  Output    600 ml  Net    400 ml    Last BM: 6/14  Labs:   Recent Labs Lab 03/04/14 1510 03/05/14 0619  NA 139 140  K 3.1* 3.9  CL 101 104  CO2 22 21  BUN 9 7  CREATININE 0.49* 0.49*  CALCIUM 9.9 9.2  GLUCOSE 82 84    CBG (last 3)   Recent Labs  03/05/14 0008 03/05/14 0618 03/05/14 1214  GLUCAP 115* 74 80    Scheduled Meds: . ampicillin-sulbactam (UNASYN) IVPB 3 g  3 g Intravenous Once  . multivitamin with minerals  1 tablet Oral Daily  . pneumococcal 23 valent vaccine  0.5 mL Intramuscular Tomorrow-1000    Continuous Infusions: . sodium chloride    . 0.9 % NaCl with KCl 20 mEq / L 75 mL/hr at 03/04/14 2141    Past Medical History  Diagnosis Date  . Arthritis   . Depression   . H/O: substance abuse   . Back pain   . Personal history of colonic polyp -  adenoma 10/26/2013    10/26/2013 diminutive sigmoid polyp removed      Past Surgical History  Procedure Laterality Date  . Lumbar epidural injection    . Colonoscopy      Pryor Ochoa RD, LDN Inpatient Clinical Dietitian Pager: [phone] After Hours Pager: [phone]

## 2014-03-06 ENCOUNTER — Inpatient Hospital Stay (HOSPITAL_COMMUNITY): Payer: Medicaid Other | Admitting: Certified Registered Nurse Anesthetist

## 2014-03-06 ENCOUNTER — Inpatient Hospital Stay (HOSPITAL_COMMUNITY): Payer: Medicaid Other

## 2014-03-06 ENCOUNTER — Encounter (HOSPITAL_COMMUNITY): Payer: Self-pay | Admitting: Gastroenterology

## 2014-03-06 ENCOUNTER — Encounter (HOSPITAL_COMMUNITY): Payer: Medicaid Other | Admitting: Certified Registered Nurse Anesthetist

## 2014-03-06 ENCOUNTER — Encounter (HOSPITAL_COMMUNITY)
Admission: EM | Disposition: A | Discharge status: Home / Self Care | Payer: Self-pay | Source: Home / Self Care | Attending: Internal Medicine

## 2014-03-06 DIAGNOSIS — K831 Obstruction of bile duct: Secondary | ICD-10-CM | POA: Diagnosis present

## 2014-03-06 DIAGNOSIS — F17200 Nicotine dependence, unspecified, uncomplicated: Secondary | ICD-10-CM

## 2014-03-06 HISTORY — PX: ERCP: SHX5425

## 2014-03-06 LAB — CBC
HCT: 32.5 % — ABNORMAL LOW (ref 36.0–46.0)
Hemoglobin: 11.1 g/dL — ABNORMAL LOW (ref 12.0–15.0)
MCH: 30.3 pg (ref 26.0–34.0)
MCHC: 34.2 g/dL (ref 30.0–36.0)
MCV: 88.8 fL (ref 78.0–100.0)
Platelets: 165 10*3/uL (ref 150–400)
RBC: 3.66 MIL/uL — ABNORMAL LOW (ref 3.87–5.11)
RDW: 17.4 % — AB (ref 11.5–15.5)
WBC: 5.3 10*3/uL (ref 4.0–10.5)

## 2014-03-06 LAB — BASIC METABOLIC PANEL
BUN: 10 mg/dL (ref 6–23)
CO2: 24 mEq/L (ref 19–32)
Calcium: 9.4 mg/dL (ref 8.4–10.5)
Chloride: 103 mEq/L (ref 96–112)
Creatinine, Ser: 0.55 mg/dL (ref 0.50–1.10)
Glucose, Bld: 127 mg/dL — ABNORMAL HIGH (ref 70–99)
Potassium: 3.5 mEq/L — ABNORMAL LOW (ref 3.7–5.3)
SODIUM: 140 meq/L (ref 137–147)

## 2014-03-06 LAB — HEPATIC FUNCTION PANEL
ALBUMIN: 2.6 g/dL — AB (ref 3.5–5.2)
ALT: 68 U/L — ABNORMAL HIGH (ref 0–35)
AST: 93 U/L — AB (ref 0–37)
Alkaline Phosphatase: 460 U/L — ABNORMAL HIGH (ref 39–117)
Bilirubin, Direct: 11.1 mg/dL — ABNORMAL HIGH (ref 0.0–0.3)
Indirect Bilirubin: 5 mg/dL — ABNORMAL HIGH (ref 0.3–0.9)
Total Bilirubin: 15.8 mg/dL — ABNORMAL HIGH (ref 0.3–1.2)
Total Protein: 7.8 g/dL (ref 6.0–8.3)

## 2014-03-06 LAB — CANCER ANTIGEN 19-9: CA 19 9: 38.3 U/mL — AB (ref <35.0)

## 2014-03-06 LAB — HCV RNA QUANT
HCV QUANT LOG: 6.05 {Log} — AB (ref <1.18)
HCV Quantitative: 1117158 IU/mL — ABNORMAL HIGH (ref <15)

## 2014-03-06 SURGERY — ERCP, WITH INTERVENTION IF INDICATED
Anesthesia: General

## 2014-03-06 MED ORDER — FENTANYL CITRATE 0.05 MG/ML IJ SOLN: 25.0000 ug | INTRAMUSCULAR | Status: DC | PRN

## 2014-03-06 MED ORDER — LACTATED RINGERS IV SOLN
INTRAVENOUS | Status: DC
  Administered 2014-03-06: 1000 mL via INTRAVENOUS

## 2014-03-06 MED ORDER — OXYCODONE HCL 5 MG PO TABS: 5.0000 mg | ORAL_TABLET | Freq: Once | ORAL | Status: AC | PRN

## 2014-03-06 MED ORDER — HYDROMORPHONE HCL PF 1 MG/ML IJ SOLN
2.0000 mg | INTRAMUSCULAR | Status: DC | PRN
  Administered 2014-03-06 – 2014-03-07 (×2): 2 mg via INTRAVENOUS
  Filled 2014-03-06 (×2): qty 2

## 2014-03-06 MED ORDER — MEPERIDINE HCL 25 MG/ML IJ SOLN: 6.2500 mg | INTRAMUSCULAR | Status: DC | PRN

## 2014-03-06 MED ORDER — OXYCODONE HCL 5 MG/5ML PO SOLN: 5.0000 mg | Freq: Once | ORAL | Status: AC | PRN

## 2014-03-06 MED ORDER — SODIUM CHLORIDE 0.9 % IV SOLN
3.0000 g | INTRAVENOUS | Status: AC
  Administered 2014-03-06: 3 g via INTRAVENOUS
  Filled 2014-03-06: qty 3

## 2014-03-06 MED ORDER — MIDAZOLAM HCL 2 MG/2ML IJ SOLN: 0.5000 mg | Freq: Once | INTRAMUSCULAR | Status: DC | PRN

## 2014-03-06 MED ORDER — LACTATED RINGERS IV SOLN
INTRAVENOUS | Status: DC | PRN
  Administered 2014-03-06: 15:00:00 via INTRAVENOUS

## 2014-03-06 MED ORDER — PROPOFOL 10 MG/ML IV BOLUS
INTRAVENOUS | Status: DC | PRN
  Administered 2014-03-06: 200 mg via INTRAVENOUS

## 2014-03-06 MED ORDER — BISACODYL 5 MG PO TBEC
5.0000 mg | DELAYED_RELEASE_TABLET | Freq: Every day | ORAL | Status: DC | PRN
  Filled 2014-03-06: qty 1

## 2014-03-06 MED ORDER — SUCCINYLCHOLINE CHLORIDE 20 MG/ML IJ SOLN
INTRAMUSCULAR | Status: DC | PRN
  Administered 2014-03-06: 100 mg via INTRAVENOUS

## 2014-03-06 MED ORDER — KETOROLAC TROMETHAMINE 30 MG/ML IJ SOLN
30.0000 mg | Freq: Four times a day (QID) | INTRAMUSCULAR | Status: DC | PRN
Start: 2014-03-06 — End: 2014-03-07
  Administered 2014-03-06: 30 mg via INTRAVENOUS
  Filled 2014-03-06: qty 1

## 2014-03-06 MED ORDER — INDOMETHACIN 50 MG RE SUPP
100.0000 mg | Freq: Once | RECTAL | Status: AC
  Administered 2014-03-06: 100 mg via RECTAL
  Filled 2014-03-06: qty 2

## 2014-03-06 MED ORDER — PROMETHAZINE HCL 25 MG/ML IJ SOLN: 6.2500 mg | INTRAMUSCULAR | Status: DC | PRN

## 2014-03-06 MED ORDER — LIDOCAINE HCL (CARDIAC) 20 MG/ML IV SOLN
INTRAVENOUS | Status: DC | PRN
  Administered 2014-03-06: 30 mg via INTRAVENOUS

## 2014-03-06 MED ORDER — SODIUM CHLORIDE 0.9 % IV SOLN
INTRAVENOUS | Status: DC | PRN
  Administered 2014-03-06: 17:00:00

## 2014-03-06 MED ORDER — FENTANYL CITRATE 0.05 MG/ML IJ SOLN
INTRAMUSCULAR | Status: DC | PRN
  Administered 2014-03-06 (×5): 50 ug via INTRAVENOUS

## 2014-03-06 MED ORDER — MORPHINE SULFATE 2 MG/ML IJ SOLN: 2.0000 mg | INTRAMUSCULAR | Status: DC | PRN

## 2014-03-06 NOTE — Transfer of Care (Signed)
Immediate Anesthesia Transfer of Care Note  Patient: Melinda Conrad  Procedure(s) Performed: Procedure(s): ENDOSCOPIC RETROGRADE CHOLANGIOPANCREATOGRAPHY (ERCP) (N/A)  Patient Location: PACU  Anesthesia Type:General  Level of Consciousness: awake, oriented, patient cooperative and responds to stimulation  Airway & Oxygen Therapy: Patient Spontanous Breathing and Patient connected to nasal cannula oxygen  Post-op Assessment: Report given to PACU RN, Post -op Vital signs reviewed and stable and Patient moving all extremities X 4  Post vital signs: Reviewed and stable  Complications: No apparent anesthesia complications

## 2014-03-06 NOTE — Progress Notes (Signed)
Agree with Ms. Gribbin's assessment and plan. Carl E. Gessner, MD, FACG  

## 2014-03-06 NOTE — Progress Notes (Signed)
PATIENT DETAILS Name: PRESLEE REGAS Age: 63 y.o. Sex: female Date of Birth: 1951/05/20 Admit Date: 03/04/2014 Admitting Physician Rise Patience, MD PCP:Pcp Not In System  Subjective: No major complaints-except for headache this am  Assessment/Plan: Principal Problem:  Obstructive Jaundice - Secondary to pancreatic mass - Seen by gastroenterology, ERCP with stent scheduled today  Active Problems:   Pancreatic mass - As above. -may need EUS at some point    Depression - Currently stable.  Hep C antibody positive -await Viral load  Tobacco abuse - Counseled extensively  History of EtOH use - Signs of withdrawal, monitor for now. If any signs, start Ativan per CIWA protocol.  Disposition: Remain inpatient  DVT Prophylaxis: SCD's  Code Status: Full code  Family Communication None  Procedures:  None  CONSULTS:  GI  Time spent 40 minutes-which includes 50% of the time with face-to-face with patient/ family and coordinating care related to the above assessment and plan.  MEDICATIONS: Scheduled Meds: . feeding supplement (ENSURE COMPLETE)  237 mL Oral BID  . multivitamin with minerals  1 tablet Oral Daily  . pneumococcal 23 valent vaccine  0.5 mL Intramuscular Tomorrow-1000   Continuous Infusions: . sodium chloride 20 mL/hr (03/05/14 1332)   PRN Meds:.acetaminophen, acetaminophen, hydrALAZINE, ketorolac, LORazepam, LORazepam, morphine injection, ondansetron (ZOFRAN) IV, ondansetron  Antibiotics: Anti-infectives   Start     Dose/Rate Route Frequency Ordered Stop   03/05/14 1230  Ampicillin-Sulbactam (UNASYN) 3 g in sodium chloride 0.9 % 100 mL IVPB     3 g 100 mL/hr over 60 Minutes Intravenous  Once 03/05/14 1150 03/05/14 1425       PHYSICAL EXAM: Vital signs in last 24 hours: Filed Vitals:   03/05/14 0541 03/05/14 1355 03/05/14 2107 03/06/14 0514  BP: 160/65 170/79 183/81 159/79  Pulse: 50 55 46 48  Temp: 97.5 F (36.4 C)  97.4 F (36.3 C) 98.1 F (36.7 C) 98.1 F (36.7 C)  TempSrc: Oral Oral Oral Oral  Resp: 18 18 18 18   Height:      Weight:      SpO2: 98% 97% 97% 100%    Weight change:  Filed Weights   03/04/14 2148  Weight: 65.772 kg (145 lb)   Body mass index is 25.69 kg/(m^2).   Gen Exam: Awake and alert with clear speech.  +scleral icterus Neck: Supple, No JVD.   Chest: B/L Clear.   CVS: S1 S2 Regular, no murmurs.  Abdomen: soft, BS +, non tender, non distended.  Extremities: no edema, lower extremities warm to touch. Neurologic: Non Focal.   Skin: No Rash.   Wounds: N/A.    Intake/Output from previous day:  Intake/Output Summary (Last 24 hours) at 03/06/14 0859 Last data filed at 03/05/14 1812  Gross per 24 hour  Intake  93.33 ml  Output      0 ml  Net  93.33 ml     LAB RESULTS: CBC  Recent Labs Lab 03/04/14 1510 03/05/14 0619 03/06/14 0637  WBC 5.8 5.1 5.3  HGB 13.0 11.3* 11.1*  HCT 37.2 33.3* 32.5*  PLT 202 170 165  MCV 88.8 89.8 88.8  MCH 31.0 30.5 30.3  MCHC 34.9 33.9 34.2  RDW 17.5* 17.5* 17.4*  LYMPHSABS 1.9  --   --   MONOABS 0.3  --   --   EOSABS 0.1  --   --   BASOSABS 0.0  --   --     Chemistries  Recent Labs Lab 03/04/14 1510 03/05/14 0619 03/06/14 0637  NA 139 140 140  K 3.1* 3.9 3.5*  CL 101 104 103  CO2 22 21 24   GLUCOSE 82 84 127*  BUN 9 7 10   CREATININE 0.49* 0.49* 0.55  CALCIUM 9.9 9.2 9.4    CBG:  Recent Labs Lab 03/05/14 0008 03/05/14 0618 03/05/14 1214  GLUCAP 115* 74 80    GFR Estimated Creatinine Clearance: 68.2 ml/min (by C-G formula based on Cr of 0.55).  Coagulation profile  Recent Labs Lab 03/04/14 2135 03/05/14 0619  INR 1.11 1.16    Cardiac Enzymes No results found for this basename: CK, CKMB, TROPONINI, MYOGLOBIN,  in the last 168 hours  No components found with this basename: POCBNP,  No results found for this basename: DDIMER,  in the last 72 hours No results found for this basename: HGBA1C,   in the last 72 hours No results found for this basename: CHOL, HDL, LDLCALC, TRIG, CHOLHDL, LDLDIRECT,  in the last 72 hours No results found for this basename: TSH, T4TOTAL, FREET3, T3FREE, THYROIDAB,  in the last 72 hours No results found for this basename: VITAMINB12, FOLATE, FERRITIN, TIBC, IRON, RETICCTPCT,  in the last 72 hours  Recent Labs  03/04/14 1510 03/05/14 0619  LIPASE 32 22    Urine Studies No results found for this basename: UACOL, UAPR, USPG, UPH, UTP, UGL, UKET, UBIL, UHGB, UNIT, UROB, ULEU, UEPI, UWBC, URBC, UBAC, CAST, CRYS, UCOM, BILUA,  in the last 72 hours  MICROBIOLOGY: No results found for this or any previous visit (from the past 240 hour(s)).  RADIOLOGY STUDIES/RESULTS: Dg Chest 2 View  03/04/2014   CLINICAL DATA:  Infiltrates.  EXAM: CHEST  2 VIEW  COMPARISON:  None.  FINDINGS: Cardiopericardial silhouette within normal limits. Mediastinal contours normal. Trachea midline. No airspace disease or effusion.  IMPRESSION: No acute cardiopulmonary disease.   Electronically Signed   By: Dereck Ligas M.D.   On: 03/04/2014 23:25   Ct Abdomen Pelvis W Contrast  03/04/2014   CLINICAL DATA:  Abdominal pain, jaundice.  EXAM: CT ABDOMEN AND PELVIS WITH CONTRAST  TECHNIQUE: Multidetector CT imaging of the abdomen and pelvis was performed using the standard protocol following bolus administration of intravenous contrast.  CONTRAST:  148mL OMNIPAQUE IOHEXOL 300 MG/ML  SOLN  COMPARISON:  None.  FINDINGS: Dependent atelectasis in the lung bases. Heart is borderline in size. No effusions.  There is intrahepatic and extrahepatic biliary ductal dilatation. Pancreatic ductal dilatation also noted in the pancreatic head and body. Pancreatic duct measures up to 11 mm. Common bile duct measures up to 14 mm. Ill-defined low-density area noted in the region of the pancreatic head turning for pancreatic head mass measuring approximately 2.4 cm on image 33 of series 201.  No focal hepatic  abnormality. Spleen, adrenals, gallbladder and kidneys are unremarkable. Uterus, and adnexa and urinary bladder are unremarkable. Small amount of free fluid in the pelvis. Stomach, large and small bowel grossly unremarkable.  Aorta and iliac vessels are non aneurysmal. No free air or adenopathy.  No acute bony abnormality. Degenerative disc and facet disease in the lower lumbar spine.  IMPRESSION: Intrahepatic and extrahepatic biliary ductal dilatation. Dilated pancreatic duct. Suggestion of ill-defined mass in the region of the pancreatic head, approximately 2.4 cm. Findings concerning for pancreatic cancer. Recommend further evaluation with MRI of the abdomen with and without contrast.  Small amount of free fluid in the pelvis.  These results were called by telephone at the  time of interpretation on 03/04/2014 at 7:30 PM to Dr. Merrily Pew , who verbally acknowledged these results.   Electronically Signed   By: Rolm Baptise M.D.   On: 03/04/2014 19:32   Mr 3d Recon At Scanner  03/05/2014   CLINICAL DATA:  Obstructive jaundice. Concern for pancreatic mass on recent CT.  EXAM: MRI ABDOMEN WITHOUT AND WITH CONTRAST (INCLUDING MRCP)  TECHNIQUE: Multiplanar multisequence MR imaging of the abdomen was performed both before and after the administration of intravenous contrast. Heavily T2-weighted images of the biliary and pancreatic ducts were obtained, and three-dimensional MRCP images were rendered by post processing.  CONTRAST:  65mL MULTIHANCE GADOBENATE DIMEGLUMINE 529 MG/ML IV SOLN  COMPARISON:  CT 03/04/2014  FINDINGS: Within the head of the pancreas there is a 17 mm x 22 mm lesion which has low signal intensity on the T1 weighted imaging (image 62, series 1500) in relation to the normal high signal in the adjacent normal pancreatic parenchyma. There is dilatation of the common bile duct and pancreatic duct proximal to this obstructing lesion (double duct sign). The degree of common bile duct dilatation  measures up to 12 mm diameter. There is mild intrahepatic duct dilatation. The pancreatic duct is dilated up to 8 mm.  The pancreatic lesion demonstrates mild peripheral enhancement on the more delayed in subtracted contrast series (image 58 series 1150).  The celiac trunk and its branches are not involved with the pancreatic head mass lesion. Likewise the superior mesenteric artery is not involved with the pancreatic lesion. The main portal vein does approximate the superior margin of the a pancreatic lesion over an approximately 16 mm junction (image 55, series 16).  There are no focal hepatic lesions. There are several periportal lymph nodes which are normal in size. For example 7 mm short axis node on image 47, series 1501.  The pancreatic body and tail are atrophic. The spleen, adrenal glands, kidneys are normal. The gallbladder is distended to 45 mm. The stomach and limited view of the small bowel colon are unremarkable. Lung bases are clear. No aggressive osseous lesion  IMPRESSION: 1. Mass lesion in the head of the pancreas measuring up to 2.2 cm which obstructs the pancreatic duct and common bile duct is most concerning for pancreatic adenocarcinoma. 2. Pancreatic mass lesion does not involve the celiac trunk or branches or the superior mesenteric artery. 3. Pancreatic mass does abut the undersurface of the proximal main portal vein. 4. No evidence of hepatic metastasis. 5. Intrahepatic biliary duct dilatation of the gallbladder consistent with biliary obstruction.   Electronically Signed   By: Suzy Bouchard M.D.   On: 03/05/2014 11:50   Mr Abd W/wo Cm/mrcp  03/05/2014   CLINICAL DATA:  Obstructive jaundice. Concern for pancreatic mass on recent CT.  EXAM: MRI ABDOMEN WITHOUT AND WITH CONTRAST (INCLUDING MRCP)  TECHNIQUE: Multiplanar multisequence MR imaging of the abdomen was performed both before and after the administration of intravenous contrast. Heavily T2-weighted images of the biliary and  pancreatic ducts were obtained, and three-dimensional MRCP images were rendered by post processing.  CONTRAST:  95mL MULTIHANCE GADOBENATE DIMEGLUMINE 529 MG/ML IV SOLN  COMPARISON:  CT 03/04/2014  FINDINGS: Within the head of the pancreas there is a 17 mm x 22 mm lesion which has low signal intensity on the T1 weighted imaging (image 62, series 1500) in relation to the normal high signal in the adjacent normal pancreatic parenchyma. There is dilatation of the common bile duct and pancreatic duct proximal to  this obstructing lesion (double duct sign). The degree of common bile duct dilatation measures up to 12 mm diameter. There is mild intrahepatic duct dilatation. The pancreatic duct is dilated up to 8 mm.  The pancreatic lesion demonstrates mild peripheral enhancement on the more delayed in subtracted contrast series (image 58 series 1150).  The celiac trunk and its branches are not involved with the pancreatic head mass lesion. Likewise the superior mesenteric artery is not involved with the pancreatic lesion. The main portal vein does approximate the superior margin of the a pancreatic lesion over an approximately 16 mm junction (image 55, series 16).  There are no focal hepatic lesions. There are several periportal lymph nodes which are normal in size. For example 7 mm short axis node on image 47, series 1501.  The pancreatic body and tail are atrophic. The spleen, adrenal glands, kidneys are normal. The gallbladder is distended to 45 mm. The stomach and limited view of the small bowel colon are unremarkable. Lung bases are clear. No aggressive osseous lesion  IMPRESSION: 1. Mass lesion in the head of the pancreas measuring up to 2.2 cm which obstructs the pancreatic duct and common bile duct is most concerning for pancreatic adenocarcinoma. 2. Pancreatic mass lesion does not involve the celiac trunk or branches or the superior mesenteric artery. 3. Pancreatic mass does abut the undersurface of the proximal  main portal vein. 4. No evidence of hepatic metastasis. 5. Intrahepatic biliary duct dilatation of the gallbladder consistent with biliary obstruction.   Electronically Signed   By: Suzy Bouchard M.D.   On: 03/05/2014 11:50    Oren Binet, MD  [facility] Pager:[phone]  If 7PM-7AM, please contact night-coverage [email or web address] Password [email or web address] 03/06/2014, 8:59 AM   LOS: 2 days   **Disclaimer: This note may have been dictated with voice recognition software. Similar sounding words can inadvertently be transcribed and this note may contain transcription errors which may not have been corrected upon publication of note.**

## 2014-03-06 NOTE — Progress Notes (Signed)
BP elevated - has been pre-op also but having pain - treating with fentanyl Will change to Dilaudid on floor Continue assessing

## 2014-03-06 NOTE — Anesthesia Preprocedure Evaluation (Addendum)
Anesthesia Evaluation  Patient identified by MRN, date of birth, ID band Patient awake    Reviewed: Allergy & Precautions, H&P , NPO status , Patient's Chart, lab work & pertinent test results  History of Anesthesia Complications Negative for: history of anesthetic complications  Airway Mallampati: I TM Distance: >3 FB Neck ROM: Full    Dental  (+) Chipped, Missing, Dental Advisory Given, Poor Dentition   Pulmonary Current Smoker,  breath sounds clear to auscultation        Cardiovascular - anginanegative cardio ROS  Rhythm:Regular Rate:Normal     Neuro/Psych negative neurological ROS     GI/Hepatic GERD-  Poorly Controlled,(+)     substance abuse  alcohol use, Elevated LFTs   Endo/Other  negative endocrine ROS  Renal/GU negative Renal ROS     Musculoskeletal   Abdominal   Peds  Hematology  (+) Blood dyscrasia (Hb 11.1), anemia ,   Anesthesia Other Findings   Reproductive/Obstetrics                         Anesthesia Physical Anesthesia Plan  ASA: III  Anesthesia Plan: General   Post-op Pain Management:    Induction: Intravenous and Rapid sequence  Airway Management Planned: Oral ETT  Additional Equipment:   Intra-op Plan:   Post-operative Plan: Extubation in OR  Informed Consent: I have reviewed the patients History and Physical, chart, labs and discussed the procedure including the risks, benefits and alternatives for the proposed anesthesia with the patient or authorized representative who has indicated his/her understanding and acceptance.   Dental advisory given  Plan Discussed with: CRNA and Surgeon  Anesthesia Plan Comments: (Plan routine monitors, GETA)        Anesthesia Quick Evaluation

## 2014-03-06 NOTE — Progress Notes (Signed)
Daily Rounding Note  03/06/2014, 8:45 AM  LOS: 2 days   SUBJECTIVE:       Pain in upper right abdomen still an issue.  Incomplete relief with 1 mg Morphine q 2 hours. paion preventing effective sleep.  No nausea, tolerated solids.  NPO for ERCP today.  No BM since arrival.   OBJECTIVE:         Vital signs in last 24 hours:    Temp:  [97.4 F (36.3 C)-98.1 F (36.7 C)] 98.1 F (36.7 C) (06/16 0514) Pulse Rate:  [46-55] 48 (06/16 0514) Resp:  [18] 18 (06/16 0514) BP: (159-183)/(79-81) 159/79 mmHg (06/16 0514) SpO2:  [97 %-100 %] 100 % (06/16 0514) Last BM Date: 03/04/14 General: pleasant, comfortable, looks well Heart: no exam Chest: unlabored quiet respirations Abdomen: not examined  Extremities: no edema.  Neuro/Psych:  Pleasant, relaxed.  Does not appear depressed  Intake/Output from previous day: 06/15 0701 - 06/16 0700 In: 93.3 [I.V.:93.3] Out: -   Intake/Output this shift:    Lab Results:  Recent Labs  03/04/14 1510 03/05/14 0619 03/06/14 0637  WBC 5.8 5.1 5.3  HGB 13.0 11.3* 11.1*  HCT 37.2 33.3* 32.5*  PLT 202 170 165   BMET  Recent Labs  03/04/14 1510 03/05/14 0619 03/06/14 0637  NA 139 140 140  K 3.1* 3.9 3.5*  CL 101 104 103  CO2 22 21 24   GLUCOSE 82 84 127*  BUN 9 7 10   CREATININE 0.49* 0.49* 0.55  CALCIUM 9.9 9.2 9.4   LFT  Recent Labs  03/04/14 1510 03/05/14 0619 03/06/14 0637  PROT 9.6* 8.0 7.8  ALBUMIN 3.4* 2.6* 2.6*  AST 131* 102* 93*  ALT 93* 70* 68*  ALKPHOS 569* 469* 460*  BILITOT 18.3* 16.2* 15.8*  BILIDIR  --  11.8* PENDING  IBILI  --  4.4* 5.0*   PT/INR  Recent Labs  03/04/14 2135 03/05/14 0619  LABPROT 14.1 14.6  INR 1.11 1.16   Hepatitis Panel  Recent Labs  03/04/14 1650  HEPBSAG NEGATIVE  HCVAB Reactive*  HEPAIGM NON REACTIVE  HEPBIGM NON REACTIVE    Studies/Results: Ct Abdomen Pelvis W Contrast 03/04/2014   FINDINGS: Dependent  atelectasis in the lung bases. Heart is borderline in size. No effusions.  There is intrahepatic and extrahepatic biliary ductal dilatation. Pancreatic ductal dilatation also noted in the pancreatic head and body. Pancreatic duct measures up to 11 mm. Common bile duct measures up to 14 mm. Ill-defined low-density area noted in the region of the pancreatic head turning for pancreatic head mass measuring approximately 2.4 cm on image 33 of series 201.  No focal hepatic abnormality. Spleen, adrenals, gallbladder and kidneys are unremarkable. Uterus, and adnexa and urinary bladder are unremarkable. Small amount of free fluid in the pelvis. Stomach, large and small bowel grossly unremarkable.  Aorta and iliac vessels are non aneurysmal. No free air or adenopathy.  No acute bony abnormality. Degenerative disc and facet disease in the lower lumbar spine.  IMPRESSION: Intrahepatic and extrahepatic biliary ductal dilatation. Dilated pancreatic duct. Suggestion of ill-defined mass in the region of the pancreatic head, approximately 2.4 cm. Findings concerning for pancreatic cancer. Recommend further evaluation with MRI of the abdomen with and without contrast.  Small amount of free fluid in the pelvis.  These results were called by telephone at the time of interpretation on 03/04/2014 at 7:30 PM to Dr. Merrily Pew , who verbally acknowledged these results.  Electronically Signed   By: Rolm Baptise M.D.   On: 03/04/2014 19:32   Mr 3d Recon At Scanner Mr Abd W/wo Cm/mrcp 03/05/2014 FINDINGS: Within the head of the pancreas there is a 17 mm x 22 mm lesion which has low signal intensity on the T1 weighted imaging (image 62, series 1500) in relation to the normal high signal in the adjacent normal pancreatic parenchyma. There is dilatation of the common bile duct and pancreatic duct proximal to this obstructing lesion (double duct sign). The degree of common bile duct dilatation measures up to 12 mm diameter. There is mild  intrahepatic duct dilatation. The pancreatic duct is dilated up to 8 mm.  The pancreatic lesion demonstrates mild peripheral enhancement on the more delayed in subtracted contrast series (image 58 series 1150).  The celiac trunk and its branches are not involved with the pancreatic head mass lesion. Likewise the superior mesenteric artery is not involved with the pancreatic lesion. The main portal vein does approximate the superior margin of the a pancreatic lesion over an approximately 16 mm junction (image 55, series 16).  There are no focal hepatic lesions. There are several periportal lymph nodes which are normal in size. For example 7 mm short axis node on image 47, series 1501.  The pancreatic body and tail are atrophic. The spleen, adrenal glands, kidneys are normal. The gallbladder is distended to 45 mm. The stomach and limited view of the small bowel colon are unremarkable. Lung bases are clear. No aggressive osseous lesion  IMPRESSION: 1. Mass lesion in the head of the pancreas measuring up to 2.2 cm which obstructs the pancreatic duct and common bile duct is most concerning for pancreatic adenocarcinoma. 2. Pancreatic mass lesion does not involve the celiac trunk or branches or the superior mesenteric artery. 3. Pancreatic mass does abut the undersurface of the proximal main portal vein. 4. No evidence of hepatic metastasis. 5. Intrahepatic biliary duct dilatation of the gallbladder consistent with biliary obstruction.   Electronically Signed   By: Suzy Bouchard M.D.   On: 03/05/2014 11:50    ASSESMENT:   * Jaundice, RUQ pain and pancreatic head mass concerning for adenoca.  CA19-9: 38.3.  Labs improved but remain significantly elevated.  * Hep C Ab reactive. Needs RNA study.  HCV RNA quant in process.  No evidence cirrhosis on CT or by labs, normal coags.   *  Hypokalemia, mild.  * Tubular adenomatous colon polyp 10/2013. Rescope due 10/2018    PLAN   *  ERCP today.  Likely stent  placement, brush the duct for cytology.  *  Po potassium per hospitalist.  *  Upped dose of Morphine, added prn po Dulcolax.  *  Reordered pre-op Unasyn as on meds review see that the "pre-op" Unasyn was given yest afternoon.     Azucena Freed  03/06/2014, 8:45 AM Pager: [phone]

## 2014-03-06 NOTE — Op Note (Signed)
[facility] [place], [id number]   ERCP PROCEDURE REPORT  PATIENT: Melinda Conrad, Melinda Conrad.  MR# :[id number] BIRTHDATE: [birth year]  GENDER: Female ENDOSCOPIST: Gatha Mayer, MD, [facility] PROCEDURE DATE:  03/06/2014 PROCEDURE:   ERCP with stent placement ASA CLASS:   Class III INDICATIONS:tumor of the head of pancreas.   bile duct stricture. MEDICATIONS: See Anesthesia Report.   IV Unasyn given TOPICAL ANESTHETIC: none  DESCRIPTION OF PROCEDURE:   After the risks benefits and alternatives of the procedure were thoroughly explained, informed consent was obtained.  The Pentax Ercp Scope [id number]  endoscope was introduced through the mouth  and advanced to the second portion of the duodenum .  1) Some food debris but otherwise normal stomach and duodenum. Esophagus not seen well. 2) Normal papilla 3) Deep cannulation of bile duct with wire and then contrast injection showed a few cm distal CBD stricture with proximal dilation.  Gallbladder did not filll. 4) Cytology brushings x 2 taken of distal CBD stricture 5) 10 Fr 7 cm plastic biliary stent placed with successful drainage of dark bile 6) No pancreatogram by intent The scope was then completely withdrawn from the patient and the procedure terminated.     COMPLICATIONS: .  There were no complications.  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

## 2014-03-06 NOTE — Anesthesia Postprocedure Evaluation (Signed)
  Anesthesia Post-op Note  Patient: Melinda Conrad  Procedure(s) Performed: Procedure(s): ENDOSCOPIC RETROGRADE CHOLANGIOPANCREATOGRAPHY (ERCP) (N/A)  Patient Location: PACU  Anesthesia Type:MAC  Level of Consciousness: awake  Airway and Oxygen Therapy: Patient Spontanous Breathing  Post-op Pain: mild  Post-op Assessment: Post-op Vital signs reviewed  Post-op Vital Signs: Reviewed  Last Vitals:  Filed Vitals:   03/06/14 1732  BP: 197/89  Pulse: 74  Temp:   Resp: 24    Complications: No apparent anesthesia complications

## 2014-03-07 ENCOUNTER — Encounter (HOSPITAL_COMMUNITY): Payer: Self-pay | Admitting: Internal Medicine

## 2014-03-07 LAB — COMPREHENSIVE METABOLIC PANEL
ALT: 77 U/L — AB (ref 0–35)
AST: 116 U/L — AB (ref 0–37)
Albumin: 2.7 g/dL — ABNORMAL LOW (ref 3.5–5.2)
Alkaline Phosphatase: 484 U/L — ABNORMAL HIGH (ref 39–117)
BUN: 11 mg/dL (ref 6–23)
CALCIUM: 9.3 mg/dL (ref 8.4–10.5)
CO2: 28 mEq/L (ref 19–32)
CREATININE: 0.54 mg/dL (ref 0.50–1.10)
Chloride: 102 mEq/L (ref 96–112)
GFR calc Af Amer: >90 mL/min (ref >90)
GFR calc non Af Amer: >90 mL/min (ref >90)
Glucose, Bld: 132 mg/dL — ABNORMAL HIGH (ref 70–99)
Potassium: 3.3 mEq/L — ABNORMAL LOW (ref 3.7–5.3)
SODIUM: 142 meq/L (ref 137–147)
TOTAL PROTEIN: 7.8 g/dL (ref 6.0–8.3)
Total Bilirubin: 11.7 mg/dL — ABNORMAL HIGH (ref 0.3–1.2)

## 2014-03-07 LAB — CBC
HCT: 35 % — ABNORMAL LOW (ref 36.0–46.0)
Hemoglobin: 11.6 g/dL — ABNORMAL LOW (ref 12.0–15.0)
MCH: 30.9 pg (ref 26.0–34.0)
MCHC: 33.1 g/dL (ref 30.0–36.0)
MCV: 93.3 fL (ref 78.0–100.0)
PLATELETS: 181 10*3/uL (ref 150–400)
RBC: 3.75 MIL/uL — ABNORMAL LOW (ref 3.87–5.11)
RDW: 17.2 % — ABNORMAL HIGH (ref 11.5–15.5)
WBC: 5 10*3/uL (ref 4.0–10.5)

## 2014-03-07 LAB — LIPASE, BLOOD: LIPASE: 984 U/L — AB (ref 11–59)

## 2014-03-07 LAB — AMYLASE: Amylase: 617 U/L — ABNORMAL HIGH (ref 0–105)

## 2014-03-07 MED ORDER — OXYCODONE HCL 5 MG PO TABS
5.0000 mg | ORAL_TABLET | ORAL | Status: DC | PRN
  Administered 2014-03-07: 5 mg via ORAL
  Filled 2014-03-07: qty 1

## 2014-03-07 MED ORDER — ENSURE COMPLETE PO LIQD: 237.0000 mL | Freq: Two times a day (BID) | ORAL | Status: DC

## 2014-03-07 MED ORDER — ONDANSETRON HCL 4 MG PO TABS: 4.0000 mg | ORAL_TABLET | Freq: Four times a day (QID) | ORAL | Status: DC | PRN

## 2014-03-07 MED ORDER — OXYCODONE HCL 5 MG PO TABS: 5.0000 mg | ORAL_TABLET | ORAL | Status: DC | PRN

## 2014-03-07 NOTE — Care Management Note (Signed)
    Page 1 of 1   03/07/2014     3:49:58 PM CARE MANAGEMENT NOTE 03/07/2014  Patient:  Melinda Conrad, Melinda Conrad   Account Number:  [id number]  Date Initiated:  03/07/2014  Documentation initiated by:  Tomi Bamberger  Subjective/Objective Assessment:   dx obsturctive jaundice, ? pancreatic ca  admit- lives with boyfriend.     Action/Plan:   will have outpt procedure next week   Anticipated DC Date:  03/07/2014   Anticipated DC Plan:  [place]  CM consult      Choice offered to / List presented to:             Status of service:  Completed, signed off Medicare Important Message given?   (If response is "NO", the following Medicare IM given date fields will be blank) Date Medicare IM given:   Date Additional Medicare IM given:    Discharge Disposition:  HOME/SELF CARE  Per UR Regulation:  Reviewed for med. necessity/level of care/duration of stay  If discussed at [place] of Stay Meetings, dates discussed:    Comments:

## 2014-03-07 NOTE — Progress Notes (Signed)
Daily Rounding Note  03/07/2014, 9:21 AM  LOS: 3 days   SUBJECTIVE:       Some pain in upper abdomen post ERCP, this is better today and better overall.  No nausea.  Still just on clears. Tells me her 61 year old mom is on 3rd floor of [facility] with heart problems, Janett would like to be able to go visit her.   OBJECTIVE:         Vital signs in last 24 hours:    Temp:  [97.6 F (36.4 C)-98.8 F (37.1 C)] 97.6 F (36.4 C) (06/17 0430) Pulse Rate:  [47-78] 60 (06/17 0430) Resp:  [12-25] 20 (06/17 0430) BP: (119-244)/(63-101) 154/76 mmHg (06/17 0430) SpO2:  [98 %-100 %] 99 % (06/17 0430) Last BM Date: 03/04/14 General: pleasant, + scleral icterus   Heart: RRR.  No MRG Chest: clear bil   No labored breathing Abdomen: soft, mild RUQ/epigastric tenderness  Extremities: no CCE Neuro/Psych:  Pleasant, alert, no gross deficits.   Lab Results:  Recent Labs  03/05/14 0619 03/06/14 0637 03/07/14 0730  WBC 5.1 5.3 5.0  HGB 11.3* 11.1* 11.6*  HCT 33.3* 32.5* 35.0*  PLT 170 165 181   LFT  Recent Labs  03/04/14 1510 03/05/14 0619 03/06/14 0637 03/07/14 0730  PROT 9.6* 8.0 7.8 7.8  ALBUMIN 3.4* 2.6* 2.6* 2.7*  AST 131* 102* 93* 116*  ALT 93* 70* 68* 77*  ALKPHOS 569* 469* 460* 484*  BILITOT 18.3* 16.2* 15.8* 11.7*  BILIDIR  --  11.8* 11.1*  --   IBILI  --  4.4* 5.0*  --      Ref. Range 03/05/2014 10:59  HCV Quantitative Latest Range: <15 IU/mL [id number] (H)  HCV Quantitative Log Latest Range: <1.18 log 10 6.05 (H)    Studies/Results: Dg Ercp 03/06/2014  FINDINGS: Three spot intraoperative fluoroscopic images of the right upper quadrant during ERCP are provided for review.  Initial image demonstrates an ERCP probe overlying the right upper abdominal quadrant.  Subsequent image demonstrates selective cannulation opacification of the common bile duct which appears markedly dilated centrally. There is no definitive  opacification of the distal aspect of the common bile duct or the pancreatic duct.  There is minimal opacification of the central aspect of the intrahepatic biliary system which appears mildly dilated.  Subsequent image demonstrates placement of a plastic internal biliary stent overlying the common bile duct with and overlying the descending duodenum and subsequent contrast decompression of the biliary system.  IMPRESSION: ERCP with biliary stent placement as above.  These images were submitted for radiologic interpretation only. Please see the procedural report for the amount of contrast and the fluoroscopy time utilized.   Electronically Signed   By: Sandi Mariscal M.D.   On: 03/06/2014 16:45     ASSESMENT:   *  Pancreatic mass with associated obstructive jaundice.  Elevated CA 19-9 - mild not diagnostic ERCP with cytology brushings (pending), plastic stent placement to distal CBD stricture.   T bili, alk phos improved.   *  Elevated lipase/amylase  post ERCP, new post ERCP.   *  New dx of HCV.  Significantly elevated viral count.  Given likely pancreatic malignancy, the appt to establish with hepatology clinic was cancelled.    PLAN   *  EUS will be arranged for next week at [facility] with Dr Ardis Hughs.  Pt aware that we will be contacting her with arrangements and advised she will need  a driver and to be NPO post midnite for this.   contact phone for pt is home phone [phone].   *  Home today once she is able to tolerate regular diet and pain is manageable with oral narcotics. Oxycodone just ordered.  Would give her adequate supply of this at discharge.    Azucena Freed  03/07/2014, 9:21 AM Pager: [phone]  [facility] Attending  I have also seen and assessed the patient and agree with the above note.  She is non-tender on my exam so do not think elevated enzymes indicate pancreatitis - expect rise in enzymes with ERCOP and stent  Gatha Mayer, MD, [facility] [phone] (pager) 03/07/2014 11:14 AM

## 2014-03-07 NOTE — Progress Notes (Signed)
NURSING PROGRESS NOTE  Melinda Conrad [id number] Discharge Data: 03/07/2014 3:23 PM Attending Provider: No att. providers found PCP:Pcp Not In [place] to be D/C'd Home per MD order.  Discussed with the patient the After Visit Summary and all questions fully answered. All IV's discontinued with no bleeding noted. All belongings returned to patient for patient to take home.   Last Vital Signs:  Blood pressure 154/76, pulse 60, temperature 97.6 F (36.4 C), temperature source Oral, resp. rate 20, height 5\' 3"  (1.6 m), weight 65.772 kg (145 lb), SpO2 99.00%.  Discharge Medication List   Medication List    STOP taking these medications       ibuprofen 800 MG tablet  Commonly known as:  ADVIL,MOTRIN      TAKE these medications       feeding supplement (ENSURE COMPLETE) Liqd  Take 237 mLs by mouth 2 (two) times daily.     omeprazole 20 MG capsule  Commonly known as:  PRILOSEC  Take 40 mg by mouth daily.     ondansetron 4 MG tablet  Commonly known as:  ZOFRAN  Take 1 tablet (4 mg total) by mouth every 6 (six) hours as needed for nausea.     oxyCODONE 5 MG immediate release tablet  Commonly known as:  Oxy IR/ROXICODONE  Take 1 tablet (5 mg total) by mouth every 4 (four) hours as needed for severe pain.         Wallie Renshaw, RN

## 2014-03-07 NOTE — Discharge Instructions (Signed)
Please continue taking Ensure Complete each day, as well as Oxycodone as needed for pain and Zofran as needed for nausea.  [facility] will arranged an Endoscopic Ultrasound for next week at [facility] with Dr Ardis Hughs. They will be contacting you with arrangements. Please be advised that you will need a driver and to not have anything to eat or drink past midnite for this procedure.

## 2014-03-07 NOTE — Discharge Summary (Signed)
Physician Discharge Summary  JALENA VANDERLINDEN GBT:[id number] DOB: 12/30/1950 DOA: 03/04/2014  PCP: Pcp Not In System  Admit date: 03/04/2014 Discharge date: 03/07/2014  Time spent: 60 minutes  Recommendations for Outpatient Follow-up:  1. Continue taking Ensure Complete, Zofran as needed for nausea, and Oxycodone as needed for pain.  2. Review cytology brushings from ERCP when results available. 3. EUS to be arranged by [facility] for next week at [facility] with Dr. Ardis Hughs.   Discharge Diagnoses:  Principal Problem:   Jaundice Active Problems:   Pancreatic mass   Depression   Common bile duct stricture   Discharge Condition: Stable  Diet recommendation: General, heart healthy  Filed Weights   03/04/14 2148  Weight: 65.772 kg (145 lb)    History of present illness:  Melinda Conrad is a 63 y.o. female with history of depression started experiencing jaundiced sclera and RUQ pain 3 weeks ago. Her PCP had referred her to GI and had appointment on June 22. Pt's pain continued to worsen so she presented to the ER. Labs revealed jaundice and elevated LFTs concerning for obstructive type. CT abd  & pelvis show intrahepatic and extrahepatic ductal dilation with possible pancreatic mass. On-call gastroenterologist was consulted by the physician and pt was admitted for further management and ERCP. Associated symptoms included nausea, decreased appetite, and weight loss over the last month. Patient denied fever, chills, CP, SOB, vomiting, and diarrhea.   Patient also stated she used to drink a lot of alcohol before many years ago but now she only drinks on weekend and last drink was 3 days ago.  Hospital Course:   Principal Problem:  Obstructive Jaundice  - Secondary to pancreatic mass  - Seen by gastroenterology, ERCP with stent placed on 6/16.Further work up including EUS will be arranged by GI as outpatient  Active Problems:  Pancreatic mass  - As above.  -GI arranging EUS  for next week, pt will be contacted by [facility] for further instructions  ?Acute pancreatitis -Likely due to ERCP yesterday, lipase elevated at 984. Minimal pain, tolerating diet so far -Given oxycodone prn pain  Depression  - Currently stable.   Hep C antibody positive  -Viral load significantly elevated - new diagnosis of Hepatitis C -Given likely pancreatic malignancy, will defer further work up and treatment to the outpatient setting. Viral load elevated at 117158  Tobacco abuse  - Counseled extensively   History of EtOH use  - Signs of withdrawal, monitored during stay  Consultations:  GI  Discharge Exam: Filed Vitals:   03/07/14 0430  BP: 154/76  Pulse: 60  Temp: 97.6 F (36.4 C)  Resp: 20   General: Well developed, well nourished, NAD, appears stated age  HEENT: PERR, EOMI, Anicteic Sclera, MMM  Neck: Supple, no JVD, no masses  Cardiovascular: RRR, S1 S2, no m/g/r  Respiratory: CTAB with equal chest rise  Abdomen: Soft, nontender, nondistended, +BS  Extremities: warm dry, without edema  Neuro: AAOx3  Skin: Without rashes or exudates  Psych: Normal affect and demeanor with intact judgement and insight    Discharge Instructions   Call MD for:  persistant nausea and vomiting    Complete by:  As directed      Call MD for:  severe uncontrolled pain    Complete by:  As directed      Call MD for:  temperature >100.4    Complete by:  As directed      Diet - low sodium heart healthy  Complete by:  As directed      Increase activity slowly    Complete by:  As directed             Medication List    STOP taking these medications       ibuprofen 800 MG tablet  Commonly known as:  ADVIL,MOTRIN      TAKE these medications       feeding supplement (ENSURE COMPLETE) Liqd  Take 237 mLs by mouth 2 (two) times daily.     omeprazole 20 MG capsule  Commonly known as:  PRILOSEC  Take 40 mg by mouth daily.     ondansetron 4 MG tablet   Commonly known as:  ZOFRAN  Take 1 tablet (4 mg total) by mouth every 6 (six) hours as needed for nausea.     oxyCODONE 5 MG immediate release tablet  Commonly known as:  Oxy IR/ROXICODONE  Take 1 tablet (5 mg total) by mouth every 4 (four) hours as needed for severe pain.       No Known Allergies    The results of significant diagnostics from this hospitalization (including imaging, microbiology, ancillary and laboratory) are listed below for reference.    Significant Diagnostic Studies: Dg Chest 2 View  03/04/2014   CLINICAL DATA:  Infiltrates.  EXAM: CHEST  2 VIEW  COMPARISON:  None.  FINDINGS: Cardiopericardial silhouette within normal limits. Mediastinal contours normal. Trachea midline. No airspace disease or effusion.  IMPRESSION: No acute cardiopulmonary disease.   Electronically Signed   By: Dereck Ligas M.D.   On: 03/04/2014 23:25   Ct Abdomen Pelvis W Contrast  03/04/2014   CLINICAL DATA:  Abdominal pain, jaundice.  EXAM: CT ABDOMEN AND PELVIS WITH CONTRAST  TECHNIQUE: Multidetector CT imaging of the abdomen and pelvis was performed using the standard protocol following bolus administration of intravenous contrast.  CONTRAST:  189mL OMNIPAQUE IOHEXOL 300 MG/ML  SOLN  COMPARISON:  None.  FINDINGS: Dependent atelectasis in the lung bases. Heart is borderline in size. No effusions.  There is intrahepatic and extrahepatic biliary ductal dilatation. Pancreatic ductal dilatation also noted in the pancreatic head and body. Pancreatic duct measures up to 11 mm. Common bile duct measures up to 14 mm. Ill-defined low-density area noted in the region of the pancreatic head turning for pancreatic head mass measuring approximately 2.4 cm on image 33 of series 201.  No focal hepatic abnormality. Spleen, adrenals, gallbladder and kidneys are unremarkable. Uterus, and adnexa and urinary bladder are unremarkable. Small amount of free fluid in the pelvis. Stomach, large and small bowel grossly  unremarkable.  Aorta and iliac vessels are non aneurysmal. No free air or adenopathy.  No acute bony abnormality. Degenerative disc and facet disease in the lower lumbar spine.  IMPRESSION: Intrahepatic and extrahepatic biliary ductal dilatation. Dilated pancreatic duct. Suggestion of ill-defined mass in the region of the pancreatic head, approximately 2.4 cm. Findings concerning for pancreatic cancer. Recommend further evaluation with MRI of the abdomen with and without contrast.  Small amount of free fluid in the pelvis.  These results were called by telephone at the time of interpretation on 03/04/2014 at 7:30 PM to Dr. Merrily Pew , who verbally acknowledged these results.   Electronically Signed   By: Rolm Baptise M.D.   On: 03/04/2014 19:32   Mr 3d Recon At Scanner  03/05/2014   CLINICAL DATA:  Obstructive jaundice. Concern for pancreatic mass on recent CT.  EXAM: MRI ABDOMEN WITHOUT AND WITH CONTRAST (INCLUDING  MRCP)  TECHNIQUE: Multiplanar multisequence MR imaging of the abdomen was performed both before and after the administration of intravenous contrast. Heavily T2-weighted images of the biliary and pancreatic ducts were obtained, and three-dimensional MRCP images were rendered by post processing.  CONTRAST:  53mL MULTIHANCE GADOBENATE DIMEGLUMINE 529 MG/ML IV SOLN  COMPARISON:  CT 03/04/2014  FINDINGS: Within the head of the pancreas there is a 17 mm x 22 mm lesion which has low signal intensity on the T1 weighted imaging (image 62, series 1500) in relation to the normal high signal in the adjacent normal pancreatic parenchyma. There is dilatation of the common bile duct and pancreatic duct proximal to this obstructing lesion (double duct sign). The degree of common bile duct dilatation measures up to 12 mm diameter. There is mild intrahepatic duct dilatation. The pancreatic duct is dilated up to 8 mm.  The pancreatic lesion demonstrates mild peripheral enhancement on the more delayed in subtracted  contrast series (image 58 series 1150).  The celiac trunk and its branches are not involved with the pancreatic head mass lesion. Likewise the superior mesenteric artery is not involved with the pancreatic lesion. The main portal vein does approximate the superior margin of the a pancreatic lesion over an approximately 16 mm junction (image 55, series 16).  There are no focal hepatic lesions. There are several periportal lymph nodes which are normal in size. For example 7 mm short axis node on image 47, series 1501.  The pancreatic body and tail are atrophic. The spleen, adrenal glands, kidneys are normal. The gallbladder is distended to 45 mm. The stomach and limited view of the small bowel colon are unremarkable. Lung bases are clear. No aggressive osseous lesion  IMPRESSION: 1. Mass lesion in the head of the pancreas measuring up to 2.2 cm which obstructs the pancreatic duct and common bile duct is most concerning for pancreatic adenocarcinoma. 2. Pancreatic mass lesion does not involve the celiac trunk or branches or the superior mesenteric artery. 3. Pancreatic mass does abut the undersurface of the proximal main portal vein. 4. No evidence of hepatic metastasis. 5. Intrahepatic biliary duct dilatation of the gallbladder consistent with biliary obstruction.   Electronically Signed   By: Suzy Bouchard M.D.   On: 03/05/2014 11:50   Dg Ercp  03/06/2014   CLINICAL DATA:  Jaundice, pancreatitis mass, dilated bile duct  EXAM: ERCP  TECHNIQUE: Multiple spot images obtained with the fluoroscopic device and submitted for interpretation post-procedure.  COMPARISON:  MRCP - 03/05/2014; CT the abdomen pelvis - 03/04/2014  FINDINGS: Three spot intraoperative fluoroscopic images of the right upper quadrant during ERCP are provided for review.  Initial image demonstrates an ERCP probe overlying the right upper abdominal quadrant.  Subsequent image demonstrates selective cannulation opacification of the common bile duct  which appears markedly dilated centrally. There is no definitive opacification of the distal aspect of the common bile duct or the pancreatic duct.  There is minimal opacification of the central aspect of the intrahepatic biliary system which appears mildly dilated.  Subsequent image demonstrates placement of a plastic internal biliary stent overlying the common bile duct with and overlying the descending duodenum and subsequent contrast decompression of the biliary system.  IMPRESSION: ERCP with biliary stent placement as above.  These images were submitted for radiologic interpretation only. Please see the procedural report for the amount of contrast and the fluoroscopy time utilized.   Electronically Signed   By: Sandi Mariscal M.D.   On: 03/06/2014 16:45  Mr Abd W/wo Cm/mrcp  03/05/2014   CLINICAL DATA:  Obstructive jaundice. Concern for pancreatic mass on recent CT.  EXAM: MRI ABDOMEN WITHOUT AND WITH CONTRAST (INCLUDING MRCP)  TECHNIQUE: Multiplanar multisequence MR imaging of the abdomen was performed both before and after the administration of intravenous contrast. Heavily T2-weighted images of the biliary and pancreatic ducts were obtained, and three-dimensional MRCP images were rendered by post processing.  CONTRAST:  32mL MULTIHANCE GADOBENATE DIMEGLUMINE 529 MG/ML IV SOLN  COMPARISON:  CT 03/04/2014  FINDINGS: Within the head of the pancreas there is a 17 mm x 22 mm lesion which has low signal intensity on the T1 weighted imaging (image 62, series 1500) in relation to the normal high signal in the adjacent normal pancreatic parenchyma. There is dilatation of the common bile duct and pancreatic duct proximal to this obstructing lesion (double duct sign). The degree of common bile duct dilatation measures up to 12 mm diameter. There is mild intrahepatic duct dilatation. The pancreatic duct is dilated up to 8 mm.  The pancreatic lesion demonstrates mild peripheral enhancement on the more delayed in  subtracted contrast series (image 58 series 1150).  The celiac trunk and its branches are not involved with the pancreatic head mass lesion. Likewise the superior mesenteric artery is not involved with the pancreatic lesion. The main portal vein does approximate the superior margin of the a pancreatic lesion over an approximately 16 mm junction (image 55, series 16).  There are no focal hepatic lesions. There are several periportal lymph nodes which are normal in size. For example 7 mm short axis node on image 47, series 1501.  The pancreatic body and tail are atrophic. The spleen, adrenal glands, kidneys are normal. The gallbladder is distended to 45 mm. The stomach and limited view of the small bowel colon are unremarkable. Lung bases are clear. No aggressive osseous lesion  IMPRESSION: 1. Mass lesion in the head of the pancreas measuring up to 2.2 cm which obstructs the pancreatic duct and common bile duct is most concerning for pancreatic adenocarcinoma. 2. Pancreatic mass lesion does not involve the celiac trunk or branches or the superior mesenteric artery. 3. Pancreatic mass does abut the undersurface of the proximal main portal vein. 4. No evidence of hepatic metastasis. 5. Intrahepatic biliary duct dilatation of the gallbladder consistent with biliary obstruction.   Electronically Signed   By: Suzy Bouchard M.D.   On: 03/05/2014 11:50    Labs: Basic Metabolic Panel:  Recent Labs Lab 03/04/14 1510 03/05/14 0619 03/06/14 0637 03/07/14 0730  NA 139 140 140 142  K 3.1* 3.9 3.5* 3.3*  CL 101 104 103 102  CO2 22 21 24 28   GLUCOSE 82 84 127* 132*  BUN 9 7 10 11   CREATININE 0.49* 0.49* 0.55 0.54  CALCIUM 9.9 9.2 9.4 9.3   Liver Function Tests:  Recent Labs Lab 03/04/14 1510 03/05/14 0619 03/06/14 0637 03/07/14 0730  AST 131* 102* 93* 116*  ALT 93* 70* 68* 77*  ALKPHOS 569* 469* 460* 484*  BILITOT 18.3* 16.2* 15.8* 11.7*  PROT 9.6* 8.0 7.8 7.8  ALBUMIN 3.4* 2.6* 2.6* 2.7*     Recent Labs Lab 03/04/14 1510 03/05/14 0619 03/07/14 0730  LIPASE 32 22 984*  AMYLASE  --   --  617*   CBC:  Recent Labs Lab 03/04/14 1510 03/05/14 0619 03/06/14 0637 03/07/14 0730  WBC 5.8 5.1 5.3 5.0  NEUTROABS 3.6  --   --   --   HGB 13.0  11.3* 11.1* 11.6*  HCT 37.2 33.3* 32.5* 35.0*  MCV 88.8 89.8 88.8 93.3  PLT 202 170 165 181   CBG:  Recent Labs Lab 03/05/14 0008 03/05/14 0618 03/05/14 1214  GLUCAP 115* 74 80     Signed:  Tammi Klippel, PA-S  [facility] 03/07/2014, 10:56 AM

## 2014-03-08 NOTE — ED Provider Notes (Signed)
Medical screening examination/treatment/procedure(s) were conducted as a shared visit with non-physician practitioner(s) or resident and myself. I personally evaluated the patient during the encounter and agree with the findings and plan unless otherwise indicated.  I have personally reviewed any xrays and/ or EKG's with the provider and I agree with interpretation.  EMERGENCY DEPARTMENT BILIARY ULTRASOUND INTERPRETATION  "Study: Limited Abdominal Ultrasound of the gallbladder and common bile duct."  INDICATIONS: Abdominal pain, Nausea and Back pain  Indication: Multiple views of the gallbladder and common bile duct were obtained in real-time with a Multi-frequency probe."  PERFORMED BY: Myself  IMAGES ARCHIVED?: Yes  FINDINGS: Gallbladder wall normal in thickness, Sonographic Murphy's sign absent and Common bile duct enlarged  LIMITATIONS: Bowel Gas  INTERPRETATION: sludge, CBD dilation    patient with worsening right upper quadrant abdominal pain worsening jaundice. Patient has history of alcohol abuse however has never had jaundice that she has a past 2-3 weeks. Bedside ultrasound showed sludge and dilated common bile duct. No fevers however worsening pain gradually. On exam patient has right upper quadrant abdominal pain, jaundice, mild general weakness, dry mucous membranes, scleral icterus. Patient admitted for further workup of pancreatic mass.  CT performed to look for reason for obstruction presentation.  Mariea Clonts, MD 03/08/14 [phone]

## 2014-03-09 ENCOUNTER — Telehealth: Payer: Self-pay | Admitting: Internal Medicine

## 2014-03-09 DIAGNOSIS — R933 Abnormal findings on diagnostic imaging of other parts of digestive tract: Secondary | ICD-10-CM

## 2014-03-09 NOTE — Telephone Encounter (Signed)
Yes - we cancelled the hepatitis clinic Dr. Ardis Hughs anticipates scheduling an EUS - likely this Thursday at [facility] He is off today and I expect he will handle this on Monday  Please let her know that the cytology unfortunately confirms cancer as I told her i suspected - does not mean we cannot treat her and hopefully cure it but still gathering information - which EUS is part of process.  Let me know if she needs me to call her and I will try to do that by [facility]

## 2014-03-09 NOTE — Telephone Encounter (Signed)
Spoke with Melinda Conrad and she is aware. Melinda Conrad did state she would like for Dr. Carlean Purl to call her Monday. Dr. Carlean Purl notified.

## 2014-03-09 NOTE — Progress Notes (Signed)
Quick Note:  Cytology + Dr. Ardis Hughs indicated he would do an EUS of panccreatic mass - does not need FNA needs to stage - ______

## 2014-03-09 NOTE — Telephone Encounter (Signed)
Pt states she is confused as to what appts she is supposed to have. Thinks she may have missed due to her mother passing away. She thinks she does not have to be seen by the hepatology clinic and it looks like from ERCP reports she is to have an EUS. Is this correct? Please advise.

## 2014-03-12 NOTE — Telephone Encounter (Signed)
I have called and left messages and cel and home # that I would call again

## 2014-03-12 NOTE — Telephone Encounter (Signed)
We spoke Her mother died and will be buried this 25-Dec-2022 so cannot do EUS Thurs  Patient constipated - rec regular MiraLax or Prune Juice and prn bisacodyl (dulcolax)

## 2014-03-12 NOTE — Progress Notes (Signed)
Quick Note:  See phone note Mother died so will not be able to do EUS Thurs this week - funeral that day  ______

## 2014-03-12 NOTE — Progress Notes (Signed)
Quick Note:  Called cell and home and left message that I would call back ______

## 2014-03-13 ENCOUNTER — Other Ambulatory Visit: Payer: Self-pay

## 2014-03-13 DIAGNOSIS — C259 Malignant neoplasm of pancreas, unspecified: Secondary | ICD-10-CM

## 2014-03-13 NOTE — Telephone Encounter (Signed)
That's great, thanks  Previous Messages     ----- Message -----  From: Barron Alvine, CMA  Sent: 03/13/2014 10:05 AM  To: [name], MD     ----- Message -----  From: Carola Frost, RN  Sent: 03/13/2014 9:48 AM  To: Barron Alvine, CMA   I can get her in to see Sherrill on 03/26/14. Is that okay?  Thanks,  Barnett Applebaum

## 2014-03-13 NOTE — Telephone Encounter (Signed)
Glendell Docker, Thanks for the message.  Melinda Conrad, See above, she cannot make appt this Thursday.  Can we get her on for next Thursday (july 2) instead for MAC, radial EUS, dx: pancreatic cancer staging. In meantime, lets refer her to medical oncology Julieanne Manson), Surgical oncology Stark Klein).  For newly diagnosed pancreatic adenocarcinoma.  Barnett Applebaum, Can you put her on for GI cancer conference, next week?

## 2014-03-13 NOTE — Telephone Encounter (Signed)
Pt needs instructions for EUS 03/15/14

## 2014-03-13 NOTE — Telephone Encounter (Signed)
Agree 

## 2014-03-13 NOTE — Telephone Encounter (Signed)
Melinda Conrad, She needs upper EUS, radial only for newly diagnosed pancreatic adenocarcinoma. This coming Thursday, MAC+. Thanks

## 2014-03-13 NOTE — Telephone Encounter (Signed)
EUS scheduled, pt instructed and medications reviewed.  Patient instructions mailed to home.  Patient to call with any questions or concerns.  

## 2014-03-13 NOTE — Telephone Encounter (Signed)
Left message on machine to call back  

## 2014-03-14 ENCOUNTER — Telehealth: Payer: Self-pay | Admitting: *Deleted

## 2014-03-14 NOTE — Telephone Encounter (Signed)
Spoke with patient by phone and conformed appointment with Dr. Benay Spice for 03/26/14.  Contact names, numbers, and directions were provided.

## 2014-03-14 NOTE — Telephone Encounter (Signed)
Attempted to reach patient without success.  Will continue to contact patient with scheduled appointments.

## 2014-03-18 ENCOUNTER — Emergency Department (HOSPITAL_COMMUNITY): Payer: Medicaid Other

## 2014-03-18 ENCOUNTER — Encounter (HOSPITAL_COMMUNITY): Payer: Self-pay | Admitting: Emergency Medicine

## 2014-03-18 ENCOUNTER — Emergency Department (HOSPITAL_COMMUNITY)
Admission: EM | Admit: 2014-03-18 | Discharge: 2014-03-18 | Disposition: A | Discharge status: Home / Self Care | Payer: Medicaid Other | Attending: Emergency Medicine | Admitting: Emergency Medicine

## 2014-03-18 DIAGNOSIS — Z8739 Personal history of other diseases of the musculoskeletal system and connective tissue: Secondary | ICD-10-CM | POA: Insufficient documentation

## 2014-03-18 DIAGNOSIS — R1013 Epigastric pain: Secondary | ICD-10-CM

## 2014-03-18 DIAGNOSIS — Z8659 Personal history of other mental and behavioral disorders: Secondary | ICD-10-CM | POA: Insufficient documentation

## 2014-03-18 DIAGNOSIS — Z9889 Other specified postprocedural states: Secondary | ICD-10-CM | POA: Insufficient documentation

## 2014-03-18 DIAGNOSIS — Z8509 Personal history of malignant neoplasm of other digestive organs: Secondary | ICD-10-CM | POA: Insufficient documentation

## 2014-03-18 DIAGNOSIS — F17200 Nicotine dependence, unspecified, uncomplicated: Secondary | ICD-10-CM | POA: Insufficient documentation

## 2014-03-18 DIAGNOSIS — Z860100 Personal history of colon polyps, unspecified: Secondary | ICD-10-CM | POA: Insufficient documentation

## 2014-03-18 DIAGNOSIS — Z79899 Other long term (current) drug therapy: Secondary | ICD-10-CM | POA: Insufficient documentation

## 2014-03-18 DIAGNOSIS — E876 Hypokalemia: Secondary | ICD-10-CM | POA: Insufficient documentation

## 2014-03-18 DIAGNOSIS — K8689 Other specified diseases of pancreas: Secondary | ICD-10-CM | POA: Insufficient documentation

## 2014-03-18 DIAGNOSIS — R17 Unspecified jaundice: Secondary | ICD-10-CM | POA: Insufficient documentation

## 2014-03-18 DIAGNOSIS — R112 Nausea with vomiting, unspecified: Secondary | ICD-10-CM

## 2014-03-18 HISTORY — DX: Acute pancreatitis without necrosis or infection, unspecified: K85.90

## 2014-03-18 LAB — CBC WITH DIFFERENTIAL/PLATELET
BASOS ABS: 0 10*3/uL (ref 0.0–0.1)
Basophils Relative: 1 % (ref 0–1)
EOS PCT: 3 % (ref 0–5)
Eosinophils Absolute: 0.2 10*3/uL (ref 0.0–0.7)
HCT: 38.1 % (ref 36.0–46.0)
Hemoglobin: 12.8 g/dL (ref 12.0–15.0)
LYMPHS PCT: 54 % — AB (ref 12–46)
Lymphs Abs: 3.4 10*3/uL (ref 0.7–4.0)
MCH: 31 pg (ref 26.0–34.0)
MCHC: 33.6 g/dL (ref 30.0–36.0)
MCV: 92.3 fL (ref 78.0–100.0)
Monocytes Absolute: 0.4 10*3/uL (ref 0.1–1.0)
Monocytes Relative: 7 % (ref 3–12)
Neutro Abs: 2.1 10*3/uL (ref 1.7–7.7)
Neutrophils Relative %: 35 % — ABNORMAL LOW (ref 43–77)
PLATELETS: 282 10*3/uL (ref 150–400)
RBC: 4.13 MIL/uL (ref 3.87–5.11)
RDW: 14.8 % (ref 11.5–15.5)
WBC: 6.2 10*3/uL (ref 4.0–10.5)

## 2014-03-18 LAB — URINALYSIS, ROUTINE W REFLEX MICROSCOPIC
BILIRUBIN URINE: NEGATIVE
GLUCOSE, UA: NEGATIVE mg/dL
HGB URINE DIPSTICK: NEGATIVE
Ketones, ur: NEGATIVE mg/dL
Leukocytes, UA: NEGATIVE
Nitrite: NEGATIVE
PROTEIN: NEGATIVE mg/dL
Specific Gravity, Urine: 1.012 (ref 1.005–1.030)
Urobilinogen, UA: 2 mg/dL — ABNORMAL HIGH (ref 0.0–1.0)
pH: 6 (ref 5.0–8.0)

## 2014-03-18 LAB — COMPREHENSIVE METABOLIC PANEL
ALT: 55 U/L — AB (ref 0–35)
AST: 86 U/L — AB (ref 0–37)
Albumin: 3.1 g/dL — ABNORMAL LOW (ref 3.5–5.2)
Alkaline Phosphatase: 408 U/L — ABNORMAL HIGH (ref 39–117)
BUN: 12 mg/dL (ref 6–23)
CALCIUM: 9.4 mg/dL (ref 8.4–10.5)
CO2: 24 meq/L (ref 19–32)
Chloride: 101 mEq/L (ref 96–112)
Creatinine, Ser: 0.54 mg/dL (ref 0.50–1.10)
GLUCOSE: 101 mg/dL — AB (ref 70–99)
POTASSIUM: 3.2 meq/L — AB (ref 3.7–5.3)
SODIUM: 141 meq/L (ref 137–147)
Total Bilirubin: 4.5 mg/dL — ABNORMAL HIGH (ref 0.3–1.2)
Total Protein: 9.7 g/dL — ABNORMAL HIGH (ref 6.0–8.3)

## 2014-03-18 LAB — LIPASE, BLOOD: Lipase: 33 U/L (ref 11–59)

## 2014-03-18 MED ORDER — ONDANSETRON 4 MG PO TBDP
4.0000 mg | ORAL_TABLET | Freq: Once | ORAL | Status: AC
  Administered 2014-03-18: 4 mg via ORAL
  Filled 2014-03-18: qty 1

## 2014-03-18 MED ORDER — HYDROCODONE-ACETAMINOPHEN 5-325 MG PO TABS: 2.0000 | ORAL_TABLET | ORAL | Status: DC | PRN

## 2014-03-18 MED ORDER — ONDANSETRON HCL 4 MG/2ML IJ SOLN
4.0000 mg | INTRAMUSCULAR | Status: AC
  Administered 2014-03-18: 4 mg via INTRAVENOUS
  Filled 2014-03-18: qty 2

## 2014-03-18 MED ORDER — HYDROMORPHONE HCL PF 1 MG/ML IJ SOLN
1.0000 mg | Freq: Once | INTRAMUSCULAR | Status: AC
  Administered 2014-03-18: 1 mg via INTRAVENOUS
  Filled 2014-03-18: qty 1

## 2014-03-18 MED ORDER — METOCLOPRAMIDE HCL 10 MG PO TABS: 10.0000 mg | ORAL_TABLET | Freq: Four times a day (QID) | ORAL | Status: DC

## 2014-03-18 MED ORDER — SODIUM CHLORIDE 0.9 % IV BOLUS (SEPSIS)
1000.0000 mL | Freq: Once | INTRAVENOUS | Status: AC
  Administered 2014-03-18: 1000 mL via INTRAVENOUS

## 2014-03-18 MED ORDER — MORPHINE SULFATE 4 MG/ML IJ SOLN
4.0000 mg | Freq: Once | INTRAMUSCULAR | Status: AC
  Administered 2014-03-18: 4 mg via INTRAVENOUS
  Filled 2014-03-18: qty 1

## 2014-03-18 MED ORDER — HYDROCODONE-ACETAMINOPHEN 5-325 MG PO TABS
2.0000 | ORAL_TABLET | Freq: Once | ORAL | Status: AC
  Administered 2014-03-18: 2 via ORAL
  Filled 2014-03-18: qty 2

## 2014-03-18 MED ORDER — POTASSIUM CHLORIDE CRYS ER 20 MEQ PO TBCR
40.0000 meq | EXTENDED_RELEASE_TABLET | Freq: Once | ORAL | Status: AC
  Administered 2014-03-18: 40 meq via ORAL
  Filled 2014-03-18: qty 2

## 2014-03-18 NOTE — Discharge Instructions (Signed)
If you were given medicines take as directed.  If you are on coumadin or contraceptives realize their levels and effectiveness is altered by many different medicines.  If you have any reaction (rash, tongues swelling, other) to the medicines stop taking and see a physician.   Please follow up as directed and return to the ER or see a physician for new or worsening symptoms.  Thank you. Filed Vitals:   03/18/14 0108 03/18/14 0227 03/18/14 0404 03/18/14 0511  BP: 185/110 190/89 179/102 162/71  Pulse: 67 75 67 63  Temp: 98.2 F (36.8 C)  97.5 F (36.4 C) 98.1 F (36.7 C)  TempSrc: Oral  Oral Oral  Resp: 16 19 15 15   Height: 5\' 3"  (1.6 m)     SpO2: 99% 97% 95% 95%

## 2014-03-18 NOTE — ED Notes (Signed)
Bed: WA04 Expected date:  Expected time:  Means of arrival:  Comments: EMS/62 yo with N/V after drinking

## 2014-03-18 NOTE — ED Provider Notes (Signed)
CSN: [id number]     Arrival date & time 03/18/14  0057 History   First MD Initiated Contact with Patient 03/18/14 0129     Chief Complaint  Patient presents with  . Nausea    Patient has pancreatitis. Patient has had stress with mom dying. Patient has had alcohol and only ate a bologna sandwich. She has severe nausea and vomiting. She is complaining of abdominal paine  . Emesis  . Abdominal Pain     (Consider location/radiation/quality/duration/timing/severity/associated sxs/prior Treatment) HPI Comments: 63 year old female with recent diagnosis of pancreatic cancer, alcohol abuse history, smoker presents with recurrent central epigastric discomfort since this evening after drinking alcohol. Patient has been stressed after morning her mother's death. Patient was recently admitted and had stent placed to relieve obstruction from pancreatic mass. Patient has GI followup outpatient. Patient denies fevers or chills however has had nausea and vomiting. No bleeding in the stools. Abdominal pain radiates to the back and is similar to previous presentation.  Patient is a 63 y.o. female presenting with vomiting and abdominal pain. The history is provided by the patient.  Emesis Associated symptoms: abdominal pain   Associated symptoms: no chills and no headaches   Abdominal Pain Associated symptoms: nausea and vomiting   Associated symptoms: no chest pain, no chills, no dysuria, no fever and no shortness of breath     Past Medical History  Diagnosis Date  . Arthritis   . Depression   . H/O: substance abuse   . Back pain   . Personal history of colonic polyp - adenoma 10/26/2013    10/26/2013 diminutive sigmoid polyp removed    . Pancreatitis, acute    Past Surgical History  Procedure Laterality Date  . Lumbar epidural injection    . Colonoscopy    . Ercp N/A 03/06/2014    Procedure: ENDOSCOPIC RETROGRADE CHOLANGIOPANCREATOGRAPHY (ERCP);  Surgeon: Gatha Mayer, MD;  Location: [facility] ENDOSCOPY;   Service: Endoscopy;  Laterality: N/A;   Family History  Problem Relation Age of Onset  . Colon cancer Neg Hx   . Esophageal cancer Neg Hx   . Rectal cancer Neg Hx   . Stomach cancer Neg Hx   . CAD Mother   . Cancer Father   . Cirrhosis Sister     she is a heavy drinker.    History  Substance Use Topics  . Smoking status: Current Every Day Smoker -- 0.25 packs/day for 40 years    Types: Cigarettes  . Smokeless tobacco: Current User  . Alcohol Use: 2.4 oz/week    2 Cans of beer, 2 Shots of liquor per week     Comment: occasionally   OB History   Grav Para Term Preterm Abortions TAB SAB Ect Mult Living                 Review of Systems  Constitutional: Negative for fever and chills.  HENT: Negative for congestion.   Eyes: Negative for visual disturbance.  Respiratory: Negative for shortness of breath.   Cardiovascular: Negative for chest pain.  Gastrointestinal: Positive for nausea, vomiting and abdominal pain.  Genitourinary: Negative for dysuria and flank pain.  Musculoskeletal: Negative for back pain, neck pain and neck stiffness.  Skin: Negative for rash.  Neurological: Negative for light-headedness and headaches.      Allergies  Review of patient's allergies indicates no known allergies.  Home Medications   Prior to Admission medications   Medication Sig Start Date End Date Taking? Authorizing Provider  feeding  supplement, ENSURE COMPLETE, (ENSURE COMPLETE) LIQD Take 237 mLs by mouth 2 (two) times daily. 03/07/14   Shanker Kristeen Mans, MD  omeprazole (PRILOSEC) 20 MG capsule Take 40 mg by mouth daily.    Historical Provider, MD  ondansetron (ZOFRAN) 4 MG tablet Take 1 tablet (4 mg total) by mouth every 6 (six) hours as needed for nausea. 03/07/14   Shanker Kristeen Mans, MD  oxyCODONE (OXY IR/ROXICODONE) 5 MG immediate release tablet Take 1 tablet (5 mg total) by mouth every 4 (four) hours as needed for severe pain. 03/07/14   Shanker Kristeen Mans, MD   BP 190/89  Pulse  75  Temp(Src) 98.2 F (36.8 C) (Oral)  Resp 19  Ht 5\' 3"  (1.6 m)  SpO2 97% Physical Exam  Nursing note and vitals reviewed. Constitutional: She is oriented to person, place, and time. She appears well-developed and well-nourished.  HENT:  Head: Normocephalic and atraumatic.  Dry mm  Eyes: Conjunctivae are normal. Right eye exhibits no discharge. Left eye exhibits no discharge. Scleral icterus is present.  Neck: Normal range of motion. Neck supple. No tracheal deviation present.  Cardiovascular: Normal rate and regular rhythm.   Pulmonary/Chest: Effort normal and breath sounds normal.  Abdominal: Soft. She exhibits no distension. There is tenderness (central and epig). There is no guarding.  Musculoskeletal: She exhibits no edema.  Neurological: She is alert and oriented to person, place, and time.  Skin: Skin is warm. No rash noted.  Psychiatric: She has a normal mood and affect.    ED Course  Procedures (including critical care time) Labs Review Labs Reviewed  CBC WITH DIFFERENTIAL - Abnormal; Notable for the following:    Neutrophils Relative % 35 (*)    Lymphocytes Relative 54 (*)    All other components within normal limits  COMPREHENSIVE METABOLIC PANEL - Abnormal; Notable for the following:    Potassium 3.2 (*)    Glucose, Bld 101 (*)    Total Protein 9.7 (*)    Albumin 3.1 (*)    AST 86 (*)    ALT 55 (*)    Alkaline Phosphatase 408 (*)    Total Bilirubin 4.5 (*)    All other components within normal limits  URINALYSIS, ROUTINE W REFLEX MICROSCOPIC - Abnormal; Notable for the following:    Urobilinogen, UA 2.0 (*)    All other components within normal limits  LIPASE, BLOOD    Imaging Review US Abdomen Complete  03/18/2014   CLINICAL DATA:  Nausea and vomiting. Pancreatitis. Upper abdominal pain.  EXAM: ULTRASOUND ABDOMEN COMPLETE  COMPARISON:  MRI 03/05/2014  FINDINGS: Gallbladder:  Minimal sludge layering within the gallbladder. No cholelithiasis. No wall  thickening or focal tenderness.  Common bile duct:  Diameter: 10 mm maximal diameter. A plastic stent is seen within the lumen of the common bile duct the level of the porta hepatis. The stent is not seen below the pancreatic mass, but does appear in good position on preceding radiography. There is intrahepatic biliary ductal dilatation which is stable or mildly decreased from prior.  Liver:  No focal lesion identified. Within normal limits in parenchymal echogenicity.  IVC:  No abnormality visualized.  Pancreas:  Pancreatic head mass as recently characterize by MRI, measuring nearly 2 cm. There is chronic ductal obstruction.  Spleen:  Size and appearance within normal limits.  Right Kidney:  Length: 11 cm. Echogenicity within normal limits. No mass or hydronephrosis visualized.  Left Kidney:  Length: 11 cm. Echogenicity within normal limits. No mass  or hydronephrosis visualized.  Abdominal aorta:  No aneurysm visualized.  Other findings:  None.  IMPRESSION: 1. No acute sonographic abnormality. 2. Stable pancreatic and ductal dilation secondary to a pancreatic head mass. Status post biliary stenting.   Electronically Signed   By: Jorje Guild M.D.   On: 03/18/2014 04:35   Dg Abd 2 Views  03/18/2014   CLINICAL DATA:  Nausea and vomiting after drinking, history of pancreatitis and biliary stent.  EXAM: ABDOMEN - 2 VIEW  COMPARISON:  CT of the abdomen and pelvis March 04, 2014  FINDINGS: The bowel gas pattern is normal. There is no evidence of free air. No radio-opaque calculi or other significant radiographic abnormality is seen.  IMPRESSION: Moderate amount of retained large bowel stool without obstruction.   Electronically Signed   By: Elon Alas   On: 03/18/2014 03:28     EKG Interpretation None      MDM   Final diagnoses:  Pancreatic mass  Epigastric pain  Nausea and vomiting, vomiting of unspecified type  HypoKalemia   Patient with recurrent similar symptoms to previous admission for  which I also saw the patient. Labwork abnormal however has improved since including bilirubin and LFTs. Potassium mild low plan for oral potassium. Pain medicines, IV fluids and nausea medicines given. Patient is not abdominal but a few days however his on narcotics and is passing gas. X-ray to be done to look for signs of acute obstruction in x-ray reviewed showing moderate amount of stool without instruction.  [place] abdo results stable.  Patient's pain and vomiting improved in ER and oral medicines given. Patient has close followup with gastroenterology. Symptoms likely combination of constipation and pancreatic mass. Reasons to return given.  Miralax for home prn. Results and differential diagnosis were discussed with the patient/parent/guardian. Close follow up outpatient was discussed, comfortable with the plan.   Medications  HYDROcodone-acetaminophen (NORCO/VICODIN) 5-325 MG per tablet 2 tablet (not administered)  ondansetron (ZOFRAN) injection 4 mg (4 mg Intravenous Given 03/18/14 0129)  sodium chloride 0.9 % bolus 1,000 mL (0 mLs Intravenous Stopped 03/18/14 0206)  morphine 4 MG/ML injection 4 mg (4 mg Intravenous Given 03/18/14 0129)  sodium chloride 0.9 % bolus 1,000 mL (0 mLs Intravenous Stopped 03/18/14 0306)  HYDROmorphone (DILAUDID) injection 1 mg (1 mg Intravenous Given 03/18/14 0227)  potassium chloride SA (K-DUR,KLOR-CON) CR tablet 40 mEq (40 mEq Oral Given 03/18/14 0403)  ondansetron (ZOFRAN-ODT) disintegrating tablet 4 mg (4 mg Oral Given 03/18/14 0522)    Filed Vitals:   03/18/14 0108 03/18/14 0227 03/18/14 0404 03/18/14 0511  BP: 185/110 190/89 179/102 162/71  Pulse: 67 75 67 63  Temp: 98.2 F (36.8 C)  97.5 F (36.4 C) 98.1 F (36.7 C)  TempSrc: Oral  Oral Oral  Resp: 16 19 15 15   Height: 5\' 3"  (1.6 m)     SpO2: 99% 97% 95% 95%        Mariea Clonts, MD 03/18/14 [phone]

## 2014-03-18 NOTE — ED Notes (Signed)
Patient has pancreatitis. Patient has had stress with mom dying. Patient has had alcohol and only ate a bologna sandwich. She has severe nausea and vomiting. She is complaining of pain. bp- 170/84, Pulse-72 and regular, RR-18.

## 2014-03-18 NOTE — ED Notes (Signed)
Dr. Michela Pitcher patient can have a few ice chips. Patient was given a few ice chips and was put on a bed pan.

## 2014-03-19 ENCOUNTER — Telehealth: Payer: Self-pay | Admitting: *Deleted

## 2014-03-19 NOTE — Telephone Encounter (Signed)
Left VM that she has misplaced her appointment paperwork. When is she supposed to have her ultrasound?  According to EPIC EUS on 03/22/14 at 0830. Sees Dr. Benay Spice on 03/26/14 at 1:30/2:00 Attempted to call her back with our appointment and phone # to contact Dr. Carlean Purl ([phone])-no answer or machine.

## 2014-03-21 ENCOUNTER — Encounter (HOSPITAL_COMMUNITY): Payer: Self-pay | Admitting: *Deleted

## 2014-03-21 ENCOUNTER — Encounter (HOSPITAL_COMMUNITY): Payer: Self-pay | Admitting: Pharmacy Technician

## 2014-03-22 ENCOUNTER — Ambulatory Visit (HOSPITAL_COMMUNITY): Admission: RE | Admit: 2014-03-22 | Payer: Medicaid Other | Source: Ambulatory Visit | Admitting: Gastroenterology

## 2014-03-22 ENCOUNTER — Other Ambulatory Visit: Payer: Self-pay

## 2014-03-22 ENCOUNTER — Telehealth: Payer: Self-pay | Admitting: Gastroenterology

## 2014-03-22 DIAGNOSIS — C259 Malignant neoplasm of pancreas, unspecified: Secondary | ICD-10-CM

## 2014-03-22 SURGERY — UPPER ENDOSCOPIC ULTRASOUND (EUS) LINEAR
Anesthesia: Monitor Anesthesia Care

## 2014-03-22 MED ORDER — PROPOFOL 10 MG/ML IV BOLUS
INTRAVENOUS | Status: AC
  Filled 2014-03-22: qty 20

## 2014-03-22 NOTE — Telephone Encounter (Signed)
The pt has been notified and instructions mailed to her home she will call with any further questions or concerns

## 2014-03-22 NOTE — Telephone Encounter (Signed)
I will call her - ? Related to grief over mother's death?

## 2014-03-22 NOTE — Telephone Encounter (Signed)
She did not show for her appt this morning.  I reached her on her cell after endo tried calling her cell and home without answer.  She said she has had terrible diarrhea yesterday, overnight and this morning after drinking a lot of prune juice "all day yesterday" for her constipation.  I let her know it was still safe to proceed, that diarrhea is expected after so much prune juice but she does not plan on coming for the appt, wants to reschedule.   This is the second time we'll be rescheduling the appt.  Patty, Can you get in touch, reschedule for July 16th EUS, radial only.  ++MAC, 60 min.   Ed Blalock

## 2014-03-22 NOTE — Telephone Encounter (Signed)
Rescheduled for 04/05/14 930 am Left message on machine to call back

## 2014-03-26 ENCOUNTER — Ambulatory Visit: Payer: Medicaid Other | Admitting: Oncology

## 2014-03-26 ENCOUNTER — Ambulatory Visit: Payer: Medicaid Other

## 2014-03-26 ENCOUNTER — Telehealth: Payer: Self-pay | Admitting: *Deleted

## 2014-03-26 NOTE — Telephone Encounter (Signed)
Spoke with patient by phone after she was a "no show".  She said she missed her appointment because she took the wrong bus to get [place] [facility].  She requested and was re-scheduled to 04/12/14. She confirmed appointment.  This RN confirmed that she has contact names, numbers, and directions to [facility].  Patient also said she is aware of EUS scheduled with Dr. Ardis Hughs on 04/05/14.  Referral made to SW to follow.

## 2014-03-27 ENCOUNTER — Telehealth: Payer: Self-pay | Admitting: Gastroenterology

## 2014-03-27 ENCOUNTER — Encounter: Payer: Self-pay | Admitting: Internal Medicine

## 2014-03-27 ENCOUNTER — Encounter (HOSPITAL_COMMUNITY): Payer: Self-pay | Admitting: *Deleted

## 2014-03-27 ENCOUNTER — Encounter: Payer: Self-pay | Admitting: *Deleted

## 2014-03-27 NOTE — Telephone Encounter (Signed)
The pt is a pt of Dr Carlean Purl, Dr Ardis Hughs only preformed the EUS this message will be sent to Barb Merino RN

## 2014-03-27 NOTE — Progress Notes (Signed)
[place] Work  Clinical Social Work was referred by patient navigator for assessment of psychosocial needs due to possible grief and loss issues, as well as, not coming to appointments.  Clinical Social Worker attempted to contact patient at home to offer support and assess for needs.  CSW had to leave a message for pt, but is also reaching out to her via mail with contact info, resources for grief and loss, etc. Based on address pt appears to live at [facility], which is a senior living building through the [employer]. They do have some limited social work services on site, are on the bus line and have [place] that visit.   CSW plans to meet with pt at her July 23 appointment and will await return contact.  Loren Racer, LCSW Clinical Social Worker [facility] Wednesday, Thursday and Friday Phone: [phone] Fax: [phone]

## 2014-03-27 NOTE — Telephone Encounter (Signed)
Error

## 2014-03-27 NOTE — Telephone Encounter (Signed)
Dr. Carlean Purl are you willing to refill oxycodone?

## 2014-03-28 ENCOUNTER — Telehealth (INDEPENDENT_AMBULATORY_CARE_PROVIDER_SITE_OTHER): Payer: Self-pay

## 2014-03-28 MED ORDER — OXYCODONE HCL 5 MG PO TABS: 5.0000 mg | ORAL_TABLET | ORAL | Status: DC | PRN

## 2014-03-28 NOTE — Telephone Encounter (Signed)
LMOV pt's appt w/ Dr. Barry Dienes moved up from 04/23/14 to 04/13/14 at 11:30 a.m.

## 2014-03-28 NOTE — Telephone Encounter (Signed)
Patient notified to come pick up rx for pain medications.  She is reminded to keep appt for EUS

## 2014-03-28 NOTE — Telephone Encounter (Signed)
Refill # 60 no refills same sig  Please remind her it is extremely important to make the 7/16 EUS appt and all other appts

## 2014-03-29 ENCOUNTER — Telehealth: Payer: Self-pay

## 2014-03-29 NOTE — Telephone Encounter (Signed)
Patient having hard time reaching Dr.Jacobs. Called and left message for patient with Dr.jacobs phone number @ [phone] per Merceda Elks, RN

## 2014-04-03 ENCOUNTER — Telehealth: Payer: Self-pay | Admitting: *Deleted

## 2014-04-03 ENCOUNTER — Telehealth: Payer: Self-pay | Admitting: Internal Medicine

## 2014-04-03 NOTE — Telephone Encounter (Signed)
All questions answered about procedure on Thursday at [facility].  She will call back for additional questions or concerns

## 2014-04-03 NOTE — Telephone Encounter (Signed)
Message from pt requesting return call. Has questions about medications and appts. Left message for her to call back.

## 2014-04-03 NOTE — Telephone Encounter (Signed)
Pt returned call, 7/23 appt confirmed. Pt asking which medications can she take prior to visit? Informed her she does not need to hold meds for [place] appts. She may have this confused with Dr. Eugenia Pancoast office. Pt stated she will call to clarify instructions.

## 2014-04-05 ENCOUNTER — Ambulatory Visit (HOSPITAL_COMMUNITY)
Admission: RE | Admit: 2014-04-05 | Discharge: 2014-04-05 | Disposition: A | Discharge status: Home / Self Care | Payer: Medicaid Other | Source: Ambulatory Visit | Attending: Gastroenterology | Admitting: Gastroenterology

## 2014-04-05 ENCOUNTER — Ambulatory Visit (HOSPITAL_COMMUNITY): Payer: Medicaid Other | Admitting: Anesthesiology

## 2014-04-05 ENCOUNTER — Encounter (HOSPITAL_COMMUNITY): Payer: Self-pay | Admitting: *Deleted

## 2014-04-05 ENCOUNTER — Encounter (HOSPITAL_COMMUNITY): Payer: Medicaid Other | Admitting: Anesthesiology

## 2014-04-05 ENCOUNTER — Encounter (HOSPITAL_COMMUNITY)
Admission: RE | Disposition: A | Discharge status: Home / Self Care | Payer: Medicaid Other | Source: Ambulatory Visit | Attending: Gastroenterology

## 2014-04-05 DIAGNOSIS — C259 Malignant neoplasm of pancreas, unspecified: Secondary | ICD-10-CM

## 2014-04-05 DIAGNOSIS — F17200 Nicotine dependence, unspecified, uncomplicated: Secondary | ICD-10-CM | POA: Diagnosis not present

## 2014-04-05 DIAGNOSIS — C250 Malignant neoplasm of head of pancreas: Secondary | ICD-10-CM | POA: Diagnosis not present

## 2014-04-05 DIAGNOSIS — K831 Obstruction of bile duct: Secondary | ICD-10-CM | POA: Diagnosis not present

## 2014-04-05 HISTORY — PX: EUS: SHX5427

## 2014-04-05 SURGERY — UPPER ENDOSCOPIC ULTRASOUND (EUS) RADIAL
Anesthesia: Monitor Anesthesia Care

## 2014-04-05 MED ORDER — PROPOFOL INFUSION 10 MG/ML OPTIME
INTRAVENOUS | Status: DC | PRN
  Administered 2014-04-05: 120 ug/kg/min via INTRAVENOUS

## 2014-04-05 MED ORDER — HYDRALAZINE HCL 20 MG/ML IJ SOLN
INTRAMUSCULAR | Status: AC
  Filled 2014-04-05: qty 1

## 2014-04-05 MED ORDER — ESMOLOL HCL 10 MG/ML IV SOLN
INTRAVENOUS | Status: DC | PRN
  Administered 2014-04-05: 20 mg via INTRAVENOUS
  Administered 2014-04-05: 10 mg via INTRAVENOUS

## 2014-04-05 MED ORDER — MIDAZOLAM HCL 5 MG/5ML IJ SOLN
INTRAMUSCULAR | Status: DC | PRN
  Administered 2014-04-05 (×2): 1 mg via INTRAVENOUS

## 2014-04-05 MED ORDER — SODIUM CHLORIDE 0.9 % IV SOLN: INTRAVENOUS | Status: DC

## 2014-04-05 MED ORDER — KETAMINE HCL 10 MG/ML IJ SOLN
INTRAMUSCULAR | Status: DC | PRN
  Administered 2014-04-05 (×2): 10 mg via INTRAVENOUS

## 2014-04-05 MED ORDER — LIDOCAINE HCL (CARDIAC) 20 MG/ML IV SOLN
INTRAVENOUS | Status: DC | PRN
  Administered 2014-04-05: 50 mg via INTRAVENOUS

## 2014-04-05 MED ORDER — LACTATED RINGERS IV SOLN
INTRAVENOUS | Status: DC
  Administered 2014-04-05: 1000 mL via INTRAVENOUS

## 2014-04-05 MED ORDER — ESMOLOL HCL 10 MG/ML IV SOLN
INTRAVENOUS | Status: AC
  Filled 2014-04-05: qty 10

## 2014-04-05 MED ORDER — LIDOCAINE HCL (CARDIAC) 20 MG/ML IV SOLN
INTRAVENOUS | Status: AC
  Filled 2014-04-05: qty 5

## 2014-04-05 MED ORDER — MIDAZOLAM HCL 2 MG/2ML IJ SOLN
INTRAMUSCULAR | Status: AC
  Filled 2014-04-05: qty 2

## 2014-04-05 MED ORDER — HYDRALAZINE HCL 20 MG/ML IJ SOLN
5.0000 mg | Freq: Once | INTRAMUSCULAR | Status: AC
  Administered 2014-04-05: 5 mg via INTRAVENOUS

## 2014-04-05 MED ORDER — BUTAMBEN-TETRACAINE-BENZOCAINE 2-2-14 % EX AERO
INHALATION_SPRAY | CUTANEOUS | Status: DC | PRN
  Administered 2014-04-05: 1 via TOPICAL

## 2014-04-05 MED ORDER — HYDRALAZINE HCL 20 MG/ML IJ SOLN
INTRAMUSCULAR | Status: DC | PRN
  Administered 2014-04-05: 5 mg via INTRAVENOUS

## 2014-04-05 MED ORDER — PROPOFOL 10 MG/ML IV BOLUS
INTRAVENOUS | Status: AC
  Filled 2014-04-05: qty 20

## 2014-04-05 NOTE — Transfer of Care (Signed)
Immediate Anesthesia Transfer of Care Note  Patient: Melinda Conrad  Procedure(s) Performed: Procedure(s): UPPER ENDOSCOPIC ULTRASOUND (EUS) RADIAL (N/A)  Patient Location: PACU and Endoscopy Unit  Anesthesia Type:MAC  Level of Consciousness: oriented and patient cooperative  Airway & Oxygen Therapy: Patient Spontanous Breathing and Patient connected to nasal cannula oxygen  Post-op Assessment: Report given to PACU RN, Post -op Vital signs reviewed and stable and Patient moving all extremities  Post vital signs: Reviewed and stable  Complications: No apparent anesthesia complications

## 2014-04-05 NOTE — Anesthesia Postprocedure Evaluation (Signed)
  Anesthesia Post-op Note  Patient: Melinda Conrad  Procedure(s) Performed: Procedure(s) (LRB): UPPER ENDOSCOPIC ULTRASOUND (EUS) RADIAL (N/A)  Patient Location: PACU  Anesthesia Type: MAC  Level of Consciousness: awake and alert   Airway and Oxygen Therapy: Patient Spontanous Breathing  Post-op Pain: mild  Post-op Assessment: Post-op Vital signs reviewed, Patient's Cardiovascular Status Stable, Respiratory Function Stable, Patent Airway and No signs of Nausea or vomiting  Last Vitals:  Filed Vitals:   04/05/14 1027  BP: 173/83  Pulse: 59  Temp: 36.7 C  Resp: 19    Post-op Vital Signs: stable   Complications: No apparent anesthesia complications

## 2014-04-05 NOTE — Op Note (Signed)
[facility] [place], [id number]   ENDOSCOPIC ULTRASOUND PROCEDURE REPORT  PATIENT: Melinda Conrad, Melinda Conrad.  MR#: [id number] BIRTHDATE: 09-Mar-1951  GENDER: Female ENDOSCOPIST: [name], MD REFERRED BY:  Gatha Mayer, M.D, [facility] PROCEDURE DATE:  04/05/2014 PROCEDURE:   Upper EUS ASA CLASS:      Class III INDICATIONS:   pancreatic adenocarcinoma: s/p ERCP/brushing Dr. Carlean Purl 3 weeks ago; brushing + for adenocarcinoma; CT ? portal vein involvement. MEDICATIONS: MAC sedation, administered by CRNA  DESCRIPTION OF PROCEDURE:   After the risks benefits and alternatives of the procedure were  explained, informed consent was obtained. The patient was then placed in the left, lateral, decubitus postion and IV sedation was administered. Throughout the procedure, the patients blood pressure, pulse and oxygen saturations were monitored continuously.  Under direct visualization, the Pentax EUS Radial [id number]  endoscope was introduced through the mouth  and advanced to the second portion of the duodenum .  Water was used as necessary to provide an acoustic interface.  Upon completion of the imaging, water was removed and the patient was sent to the recovery room in satisfactory condition.   Endoscopic findings: 1. Normal UGI tract.  Ampullary region not visualized.  EUS findings: 1. 2.5cm mass in head of pancreas causing pancreatic and biliary duct obstruction.  The previously placed biliary stent is in good position. There was clear loss of normal tissue interface between the mass and the portal vein (near the confluence with SMV) along 1.5cm, very suggestive of invasion.  The mass does not involve celiac trunk or SMA. 2. No peripancreatic adenopathy. 3. Main pancreatic duct is obstructed, dilated in body and tail to 91mm. 4. Limited views of liver, spleen were all normal.  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  She is already set to meet Drs. Barry Dienes and Sherrill next week. She will likely benefit from neoadjuvant chemo/XRT approach.   _______________________________ eSigned:  [name], MD 04/05/2014 10:26 AM

## 2014-04-05 NOTE — Anesthesia Preprocedure Evaluation (Signed)
Anesthesia Evaluation  Patient identified by MRN, date of birth, ID band Patient awake    Reviewed: Allergy & Precautions, H&P , NPO status , Patient's Chart, lab work & pertinent test results  Airway Mallampati: II TM Distance: >3 FB Neck ROM: Full    Dental  (+) Missing, Poor Dentition, Chipped, Dental Advisory Given   Pulmonary Current Smoker,  breath sounds clear to auscultation  Pulmonary exam normal       Cardiovascular negative cardio ROS  Rhythm:Regular Rate:Normal     Neuro/Psych negative neurological ROS  negative psych ROS   GI/Hepatic negative GI ROS, (+)     substance abuse  alcohol use,   Endo/Other  negative endocrine ROS  Renal/GU negative Renal ROS  negative genitourinary   Musculoskeletal negative musculoskeletal ROS (+)   Abdominal   Peds negative pediatric ROS (+)  Hematology negative hematology ROS (+)   Anesthesia Other Findings   Reproductive/Obstetrics negative OB ROS                           Anesthesia Physical Anesthesia Plan  ASA: III  Anesthesia Plan: MAC   Post-op Pain Management:    Induction: Intravenous  Airway Management Planned: Nasal Cannula  Additional Equipment:   Intra-op Plan:   Post-operative Plan:   Informed Consent: I have reviewed the patients History and Physical, chart, labs and discussed the procedure including the risks, benefits and alternatives for the proposed anesthesia with the patient or authorized representative who has indicated his/her understanding and acceptance.   Dental advisory given  Plan Discussed with: CRNA and Surgeon  Anesthesia Plan Comments:         Anesthesia Quick Evaluation

## 2014-04-05 NOTE — Interval H&P Note (Signed)
History and Physical Interval Note:  04/05/2014 9:41 AM  Melinda Conrad  has presented today for surgery, with the diagnosis of Malignant neoplasm of pancreas, part unspecified [157.9]  The various methods of treatment have been discussed with the patient and family. After consideration of risks, benefits and other options for treatment, the patient has consented to  Procedure(s): UPPER ENDOSCOPIC ULTRASOUND (EUS) RADIAL (N/A) as a surgical intervention .  The patient's history has been reviewed, patient examined, no change in status, stable for surgery.  I have reviewed the patient's chart and labs.  Questions were answered to the patient's satisfaction.     [name]

## 2014-04-05 NOTE — Discharge Instructions (Signed)

## 2014-04-05 NOTE — H&P (View-Only) (Signed)
Daily Rounding Note  03/07/2014, 9:21 AM  LOS: 3 days   SUBJECTIVE:       Some pain in upper abdomen post ERCP, this is better today and better overall.  No nausea.  Still just on clears. Tells me her 61 year old mom is on 3rd floor of [facility] with heart problems, Kambrie would like to be able to go visit her.   OBJECTIVE:         Vital signs in last 24 hours:    Temp:  [97.6 F (36.4 C)-98.8 F (37.1 C)] 97.6 F (36.4 C) (06/17 0430) Pulse Rate:  [47-78] 60 (06/17 0430) Resp:  [12-25] 20 (06/17 0430) BP: (119-244)/(63-101) 154/76 mmHg (06/17 0430) SpO2:  [98 %-100 %] 99 % (06/17 0430) Last BM Date: 03/04/14 General: pleasant, + scleral icterus   Heart: RRR.  No MRG Chest: clear bil   No labored breathing Abdomen: soft, mild RUQ/epigastric tenderness  Extremities: no CCE Neuro/Psych:  Pleasant, alert, no gross deficits.   Lab Results:  Recent Labs  03/05/14 0619 03/06/14 0637 03/07/14 0730  WBC 5.1 5.3 5.0  HGB 11.3* 11.1* 11.6*  HCT 33.3* 32.5* 35.0*  PLT 170 165 181   LFT  Recent Labs  03/04/14 1510 03/05/14 0619 03/06/14 0637 03/07/14 0730  PROT 9.6* 8.0 7.8 7.8  ALBUMIN 3.4* 2.6* 2.6* 2.7*  AST 131* 102* 93* 116*  ALT 93* 70* 68* 77*  ALKPHOS 569* 469* 460* 484*  BILITOT 18.3* 16.2* 15.8* 11.7*  BILIDIR  --  11.8* 11.1*  --   IBILI  --  4.4* 5.0*  --      Ref. Range 03/05/2014 10:59  HCV Quantitative Latest Range: <15 IU/mL [id number] (H)  HCV Quantitative Log Latest Range: <1.18 log 10 6.05 (H)    Studies/Results: Dg Ercp 03/06/2014  FINDINGS: Three spot intraoperative fluoroscopic images of the right upper quadrant during ERCP are provided for review.  Initial image demonstrates an ERCP probe overlying the right upper abdominal quadrant.  Subsequent image demonstrates selective cannulation opacification of the common bile duct which appears markedly dilated centrally. There is no definitive  opacification of the distal aspect of the common bile duct or the pancreatic duct.  There is minimal opacification of the central aspect of the intrahepatic biliary system which appears mildly dilated.  Subsequent image demonstrates placement of a plastic internal biliary stent overlying the common bile duct with and overlying the descending duodenum and subsequent contrast decompression of the biliary system.  IMPRESSION: ERCP with biliary stent placement as above.  These images were submitted for radiologic interpretation only. Please see the procedural report for the amount of contrast and the fluoroscopy time utilized.   Electronically Signed   By: Sandi Mariscal M.D.   On: 03/06/2014 16:45     ASSESMENT:   *  Pancreatic mass with associated obstructive jaundice.  Elevated CA 19-9 - mild not diagnostic ERCP with cytology brushings (pending), plastic stent placement to distal CBD stricture.   T bili, alk phos improved.   *  Elevated lipase/amylase  post ERCP, new post ERCP.   *  New dx of HCV.  Significantly elevated viral count.  Given likely pancreatic malignancy, the appt to establish with hepatology clinic was cancelled.    PLAN   *  EUS will be arranged for next week at [facility] with Dr Ardis Hughs.  Pt aware that we will be contacting her with arrangements and advised she will need  a driver and to be NPO post midnite for this.   contact phone for pt is home phone [phone].   *  Home today once she is able to tolerate regular diet and pain is manageable with oral narcotics. Oxycodone just ordered.  Would give her adequate supply of this at discharge.    Azucena Freed  03/07/2014, 9:21 AM Pager: [phone]  [facility] Attending  I have also seen and assessed the patient and agree with the above note.  She is non-tender on my exam so do not think elevated enzymes indicate pancreatitis - expect rise in enzymes with ERCOP and stent  Gatha Mayer, MD, [facility] [phone] (pager) 03/07/2014 11:14 AM

## 2014-04-06 ENCOUNTER — Telehealth: Payer: Self-pay | Admitting: Gastroenterology

## 2014-04-06 ENCOUNTER — Encounter (HOSPITAL_COMMUNITY): Payer: Self-pay | Admitting: Gastroenterology

## 2014-04-06 MED ORDER — ONDANSETRON HCL 4 MG PO TABS: 4.0000 mg | ORAL_TABLET | Freq: Four times a day (QID) | ORAL | Status: DC | PRN

## 2014-04-06 NOTE — Telephone Encounter (Signed)
Patient only needs a refill on her zofran, just got a refill on roxicodone last week.  I will send in the rx for zofran.  She reports she does not need refill on metoclopramide or hydrocodone- she was reading from her discharge summary list.

## 2014-04-09 ENCOUNTER — Telehealth: Payer: Self-pay | Admitting: Internal Medicine

## 2014-04-09 MED ORDER — ONDANSETRON HCL 4 MG PO TABS: 4.0000 mg | ORAL_TABLET | Freq: Four times a day (QID) | ORAL | Status: DC | PRN

## 2014-04-09 NOTE — Telephone Encounter (Signed)
I have explained to the patient she will need to discuss that with Dr. Benay Spice at her appt with him on Thursday.  Her financial counselor appt is to discuss insurance/payment for her possible chemo and radiation.  She will discuss all this with Dr. Benay Spice on Thursday.

## 2014-04-12 ENCOUNTER — Ambulatory Visit: Payer: Medicaid Other

## 2014-04-12 ENCOUNTER — Encounter: Payer: Self-pay | Admitting: Oncology

## 2014-04-12 ENCOUNTER — Ambulatory Visit (HOSPITAL_BASED_OUTPATIENT_CLINIC_OR_DEPARTMENT_OTHER): Payer: Medicaid Other | Admitting: Oncology

## 2014-04-12 ENCOUNTER — Other Ambulatory Visit: Payer: Self-pay | Admitting: *Deleted

## 2014-04-12 ENCOUNTER — Telehealth: Payer: Self-pay | Admitting: Oncology

## 2014-04-12 VITALS — BP 158/72 | HR 51 | Temp 98.2°F | Resp 18 | Ht 63.0 in | Wt 135.0 lb

## 2014-04-12 DIAGNOSIS — G893 Neoplasm related pain (acute) (chronic): Secondary | ICD-10-CM

## 2014-04-12 DIAGNOSIS — F329 Major depressive disorder, single episode, unspecified: Secondary | ICD-10-CM

## 2014-04-12 DIAGNOSIS — K5900 Constipation, unspecified: Secondary | ICD-10-CM

## 2014-04-12 DIAGNOSIS — F3289 Other specified depressive episodes: Secondary | ICD-10-CM

## 2014-04-12 DIAGNOSIS — C250 Malignant neoplasm of head of pancreas: Secondary | ICD-10-CM

## 2014-04-12 DIAGNOSIS — R634 Abnormal weight loss: Secondary | ICD-10-CM

## 2014-04-12 DIAGNOSIS — F17200 Nicotine dependence, unspecified, uncomplicated: Secondary | ICD-10-CM

## 2014-04-12 DIAGNOSIS — F1010 Alcohol abuse, uncomplicated: Secondary | ICD-10-CM

## 2014-04-12 MED ORDER — OXYCODONE HCL 5 MG PO TABS: 5.0000 mg | ORAL_TABLET | ORAL | Status: DC | PRN

## 2014-04-12 NOTE — Progress Notes (Signed)
[facility] Patient Consult   Referring MD: [name], Md [address], [place] [address]   Melinda Conrad 63 y.o.  10/24/1950    Reason for Referral: Pancreas cancer   HPI: She reports a 2 to three-week history of abdominal pain prior to developing jaundice. [facility] and was noted to have a markedly elevated bilirubin. A CT of the abdomen and pelvis on 03/04/2014 revealed intrahepatic and extrahepatic biliary ductal dilatation. Pancreatic ductal dilatation was noted in the pancreatic head and body. An ill-defined low-density area was noted in the region of the pancreatic head measuring 2.4 cm, no focal hepatic abnormality.  Dr. Carlean Purl was consult at and she was taken to an ERCP procedure on 03/06/2014 a distal common bile duct stricture was noted. Cytology brushings were taken from the common bile duct stricture and a plastic stent was placed. The cytology from 2  bile duct brushing specimens confirmed malignant cells consistent with adenocarcinoma.  An MRI of the abdomen 03/05/2014 confirmed a pancreas head mass the celiac trunk and branches are not involved with the mass. The superior mesenteric artery is uninvolved. The main portal vein approximates the superior margin of the mass. No focal hepatic lesions. Normal size. Portal lymph nodes. The gallbladder was distended.  She was referred to Dr. Ardis Hughs and an endoscopic ultrasound on 04/05/2014 confirmed a 2.5 cm mass in the head of the pancreas with a biliary stent in good position. Clear loss of normal tissue interface between the mass and a portal vein along 1.5 cm suggestive of invasion. The mass does not involve the celiac trunk or superior mesenteric artery. No peripancreatic adenopathy. The mass was staged as a uT3N0 lesion.  Melinda Conrad reports resolution of jaundice. She continues to have abdominal pain, relieved with oxycodone.  Past Medical History    Diagnosis Date  . Arthritis   . Depression   . H/O: substance abuse   . Back pain-chronic, she reports receiving "injections "   . Personal history of colonic polyp - adenoma 10/26/2013    10/26/2013 diminutive sigmoid polyp removed    . Pancreatitis, acute     .   G3 P3  Past Surgical History  Procedure Laterality Date  . Lumbar epidural injection    . Colonoscopy    . Ercp N/A 03/06/2014    Procedure: ENDOSCOPIC RETROGRADE CHOLANGIOPANCREATOGRAPHY (ERCP);  Surgeon: Gatha Mayer, MD;  Location: [facility] ENDOSCOPY;  Service: Endoscopy;  Laterality: N/A;  . Eus N/A 04/05/2014    Procedure: UPPER ENDOSCOPIC ULTRASOUND (EUS) RADIAL;  Surgeon: [name], MD;  Location: [facility] ENDOSCOPY;  Service: Endoscopy;  Laterality: N/A;    Medications: Reviewed  Allergies: No Known Allergies  Family history: One sister and 2 brothers. Her father had "cancer ". A maternal aunt had lung cancer. No other family history of cancer.  Social History:   She lives alone in [place]. She is currently working on her GED at [employer]. He is in a vocational rehabilitation program. She currently smokes 4-60 respiratory. She drinks 2  40 ounce beers on the weekend.  History  Alcohol Use  . 2.4 oz/week  . 2 Cans of beer, 2 Shots of liquor per week    Comment: occasionally. Quit  , none in 3weeks"only drank before  to help with pain before ERD visit"    History  Smoking status  . Current Every Day Smoker -- 0.25 packs/day for 40 years  .  Types: Cigarettes  Smokeless tobacco  . Current User      ROS:   Positives include: Anorexia, temp and weight loss, yellow colored stool prior to placement of a biliary stent, headache with an elevated blood pressure following the EUS procedure, abdominal pain, back pain, constipation  A complete ROS was otherwise negative.  Physical Exam:  Blood pressure 158/72, pulse 51, temperature 98.2 F (36.8 C), temperature source Oral, resp. rate 18, height 5\' 3"  (1.6 m),  weight 135 lb (61.236 kg), SpO2 100.00%.  HEENT: Multiple missing teeth, oropharynx without visible mass, neck without mass Lungs: Clear bilaterally Cardiac: Regular rate and rhythm Abdomen: No hepatosplenomegaly, no apparent ascites, no mass, tender in the right upper  Vascular: No leg edema Lymph nodes: No cervical, supraclavicular, or inguinal nodes. "Shotty "bilateral axillary nodes Neurologic: Alert and oriented, the motor exam appears intact in the upper and lower extremities Skin: No rash Musculoskeletal: Tender at the mid spine and left mid posterior chest   LAB:  CBC  Lab Results  Component Value Date   WBC 6.2 03/18/2014   HGB 12.8 03/18/2014   HCT 38.1 03/18/2014   MCV 92.3 03/18/2014   PLT 282 03/18/2014   NEUTROABS 2.1 03/18/2014     CMP      Component Value Date/Time   NA 141 03/18/2014 0124   K 3.2* 03/18/2014 0124   CL 101 03/18/2014 0124   CO2 24 03/18/2014 0124   GLUCOSE 101* 03/18/2014 0124   BUN 12 03/18/2014 0124   CREATININE 0.54 03/18/2014 0124   CALCIUM 9.4 03/18/2014 0124   PROT 9.7* 03/18/2014 0124   ALBUMIN 3.1* 03/18/2014 0124   AST 86* 03/18/2014 0124   ALT 55* 03/18/2014 0124   ALKPHOS 408* 03/18/2014 0124   BILITOT 4.5* 03/18/2014 0124   GFRNONAA >90 03/18/2014 0124   GFRAA >90 03/18/2014 0124   CA 19-9 on 03/05/2014-38.3  Imaging:  As per history of present illness, I reviewed the abdomen CT from 03/04/2014 with Melinda Conrad and her daughter   Assessment/Plan:   1. 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 Pain secondary to #1  3.  constipation-likely secondary to narcotic analgesic  4.   anorexia/weight loss  5.  tobacco and alcohol use  6.  history of a colon polyp in February 2015  7.  depression   Disposition:   Melinda Conrad has been diagnosed with adenocarcinoma the pancreas. I discussed the prognosis and treatment options with Melinda Conrad and her daughter. She appears to have "borderline "resectable disease based on the staging evaluation to date. She is scheduled to see Dr. Barry Dienes 04/13/2014. Her case will be presented at the GI tumor conference 04/18/2014. We will plan neoadjuvant therapy if Dr. Barry Dienes is in agreement.  I think it would be difficult for Melinda Conrad to tolerate FOLFIRINOX. I will recommend treatment with Xeloda and radiation. We made a radiation oncology referral today.  We reviewed the potential toxicities associated with Xeloda including a chance for nausea mucositis, diarrhea, and hematologic toxicity. We discussed the rash, hyperpigmentation, and hand/foot syndrome associated with Xeloda. She will attend a chemotherapy teaching class. She agrees to proceed.  I anticipate beginning concurrent  Xeloda and radiation during the week of 04/30/2014 if Dr. Sondra Come is in agreement. Melinda Conrad will return for an office visit 05/11/2014.  We gave her a prescription for oxycodone. She will continue Docusate and begin MiraLAX for constipation.  Approximately 50 minutes were spent with patient today. The majority of time was used for counseling and coordination of care.  [place], [place] 04/12/2014, 5:05 PM

## 2014-04-12 NOTE — Progress Notes (Signed)
Checked in new patient with no financial issues prior to seeing the dr. She has appt card,

## 2014-04-12 NOTE — Telephone Encounter (Signed)
[facility] PT APPT SCHEDULE FOR JULY/AUG. THEN ADDED NUTR APPT FOR SAME DAY AS LB/BS. S/W PT SHE IS AWARE. S/w Suanne Marker re appt w/Dr. Lisbeth Renshaw - per Suanne Marker Dr. Lisbeth Renshaw not available - [facility] also called and they are working on getting the patient in w/Dr. Sondra Come. Per Suanne Marker radonc will call pt w/appt - pt aware.

## 2014-04-12 NOTE — Progress Notes (Signed)
Met with Melinda Conrad and family. Explained role of nurse navigator. Educational information provided on pancreas cancer  Referral made to dietician for diet education. [place] resources provided to patient, including SW service information and she was seen today discuss transportation.  Contact names and phone numbers were provided for entire [facility] team.  Teach back method was used.    Will continue to follow as needed.

## 2014-04-13 ENCOUNTER — Encounter: Payer: Self-pay | Admitting: *Deleted

## 2014-04-13 ENCOUNTER — Encounter (INDEPENDENT_AMBULATORY_CARE_PROVIDER_SITE_OTHER): Payer: Self-pay | Admitting: General Surgery

## 2014-04-13 ENCOUNTER — Ambulatory Visit (INDEPENDENT_AMBULATORY_CARE_PROVIDER_SITE_OTHER): Payer: Medicaid Other | Admitting: General Surgery

## 2014-04-13 VITALS — BP 182/80 | HR 60 | Resp 16 | Ht 63.0 in | Wt 133.8 lb

## 2014-04-13 DIAGNOSIS — C250 Malignant neoplasm of head of pancreas: Secondary | ICD-10-CM

## 2014-04-13 NOTE — Patient Instructions (Signed)
WHIPPLE (PANCREATICODUODENECTOMY) THE OPERATION: The goal of the operation is to remove masses in the head of the pancreas, the distal bile duct, or the duodenum.  The structures that are removed include the head of the pancreas, the duodenum, the gallbladder, and a small portion of your stomach.  The jejunum, or middle part of the small intestine, is then used to reconnect the bile duct, the pancreas, and the stomach to the intestinal tract.  The reason so much needs to be removed is that the blood supply and lymphatic channels and nodes are closely interconnected.  The operation takes between 4 and 8 hours depending on the amount of scar tissue you have present from prior surgeries, or from the mass.    WHAT HAPPENS IN THE OPERATING ROOM: I will start the operation with a diagnostic laparoscopy.  This means we will make small incisions to see if there is visible cancer outside of the pancreas.  If there is visible spread of cancer, I will stop the operation because research has shown that survival for pancreatic cancer is worse if you undergo a big operation without being able to remove all the cancer.  If no other cancer is seen, I will make a bigger incision on your abdomen and begin mobilizing the stomach, duodenum and gallbladder to see if the tumor is able to be removed.  Sometimes the tumor is too adherent to the arteries and veins supplying the liver and small intestine that it is unsafe or not possible to remove the tumor.  If this is the case, the procedure would be stopped and the tumor would be left in place.  Whatever the operative findings, I will discuss the case with your family after we are done in the operating room.  I will talk to you in the next few days when you are more awake.  I will see you in the hospital every weekday that I am not out of town.  My partners help see patients on the weekends and if I am out of town.    WHAT HAPPENS AFTER SURGERY: After surgery, you will go  first to the recovery room, then to the ICU for careful monitoring.  It is important that I am able to know your vital signs and urine output to see how you are doing.  Depending on many factors, you may be in the ICU overnight, or for several days.  You will have many tubes and lines in place which is standard for this operation.  You will have several IVs, including possibly a central line in your chest or neck.  You will likely have an arterial line in your wrist.  You will have a tube in your nose for 4-5 days in order to suction out the stomach and liver secretions to help the surgical connections heal.  YOU WILL NOT BE ABLE TO EAT FOR AT LEAST 4-5 DAYS AFTER SURGERY.  You will have a catheter in your bladder.  On your abdomen, you will have several surgical drains and possibly a pain pump with numbing medicine.  You will have compression stockings on your legs to decrease the risk of blood clots.    Sometimes anesthesia makes a decision that you may need to be left on the breathing machine for a period after surgery.  This may be based on your overall health status, or events during surgery.    We will address your pain in several ways.  We will use an IV pain pump  called a "PCA," or Patient Controlled Analgesia.  This allows you to press a button and immediately receive a dose of pain medication without waiting for a nurse.  We also use IV Tylenol and sometimes IV Toradol which is similar to ibuprofen.  You may also have a pump with numbing medicine delivered directly to your incision.  I use doses and medications that work for the majority of people, but you may need an adjustment to the dose or type of medicine if your pain is not adequately controlled.  Your throat may be sore, in which case you may need a throat spray or lozenges.    We will ask you to get out of bed the day after surgery in order to maximize your chances of not having complications.  Your risk of pneumonia and blood clots is lower  with walking and sitting in the chair.  We will also ask you to perform breathing exercises.  We will also ask to you walk in your room and in the halls for the above reasons, but also in order for you to keep up your strength.    EATING: We will usually start you on clear liquids in around 5 days if your bowel function seems to have returned.  We advance your diet slowly to make sure you are tolerating each step.  All patients do not have a normal appetite when they go home and usually have to take 2-4 cans of nutritional supplement per day while this is improving.  Most patients also find that their taste buds do not seem the same right after surgery, and this can continue into the time of possible post operative chemotherapy and radiation.  Some patients develop diabetes and will need assistance from a primary care doctor for medication.    OTHER TREATMENT? I will present your case when the pathology is available at our GI Multidisciplinary conference which occurs every 1-2 weeks.  Medical and Radiation Oncologists are present, as well as the pathologist and radiologist in addition to myself.  If you have a larger tumor or if you have positive lymph nodes, we may have the oncologist stop by to see you while you are in the hospital.  Most firm post op treatment plans occur, however, once you are an outpatient because we will need to see how you are recovering from surgery.  GOING HOME! Usually you are able to go home in 8-14 days, depending on whether or not complications happen and what is going on with your overall health status.   If you have more health problems or if you have limited help at home, the therapists and nurses may recommend a temporary rehab or nursing facility to help you get back on your feet before you go home.  These decisions would be made while you are in the hospital with the assistance of a social worker or case manager.    Please bring all insurance/disability forms to our  office for the staff to fill out   POSSIBLE COMPLICATIONS This is a very extensive operation and includes complications listed below: Bleeding Infection and possible wound complications such as hernia Damage to adjacent structures Leak of surgical connections, the worst of which is the connection between the intestine and the pancreas (20%) Possible need for other procedures, such as abscess drains in radiology or endoscopy.   Possible prolonged hospital stay Possible development of diabetes or worsening of current diabetes. (5-20%) Possible diarrhea from lack of pancreatic enzymes.   Prolonged  fatigue/weakness/appetite MOST PATIENTS' ENERGY LEVEL IS NOT BACK TO NORMAL FOR AT LEAST 4-6 MONTHS.  OLDER PATIENTS MAY FEEL WEAK FOR LONGER PERIODS OF TIME.   Difficulty with eating or post operative nausea (around 30%) Possible early recurrence of cancer Possible complications of your medical problems such as heart disease or arrhythmias. Death (less than 2%)  All possible complications are not listed, just the most common.    FURTHER INFORMATION? Please ask questions if you find something that we did not discuss in the office and would like more information.  If you would like another appointment if you have many questions or if your family members would like to come as well, please contact the office.     IF YOU ARE TAKING ASPIRIN, PLAVIX, COUMADIN, OR OTHER BLOOD THINNERS, LET us KNOW IMMEDIATELY SO WE CAN CONTACT YOUR PRESCRIBING HEALTH CARE PROVIDER TO HOLD THE MEDICATION FOR 5-7 DAYS BEFORE SURGERY

## 2014-04-13 NOTE — Progress Notes (Signed)
GI Location of Tumor / Histology: pancreatic adenocarcinoma  Melinda Conrad presented per Dr. Gearldine Shown note "2 to three-week history of abdominal pain prior to developing jaundice. [facility] and was noted to have a markedly elevated bilirubin. A CT of the abdomen and pelvis on 03/04/2014 revealed intrahepatic and extrahepatic biliary ductal dilatation. Pancreatic ductal dilatation was noted in the pancreatic head and body. An ill-defined low-density area was noted in the region of the pancreatic head measuring 2.4 cm, no focal hepatic abnormality."  Biopsies revealed:  03/06/14 Diagnosis BILE DUCT BRUSHING (SPECIMEN 1 OF 2, COLLECTED ON 03/06/14): MALIGNANT CELLS PRESENT, CONSISTENT WITH ADENOCARCINOMA. SEE COMMENT.  03/06/14 Diagnosis BILE DUCT BRUSHING #2 (SPECIMEN 2 OF 2, COLLECTED ON 03/06/14): MALIGNANT CELLS PRESENT, CONSISTENT WITH ADENOCARCINOMA.  Past/Anticipated interventions by surgeon, if any: 03/06/14 - Procedure: ENDOSCOPIC RETROGRADE CHOLANGIOPANCREATOGRAPHY (ERCP);  Surgeon: Gatha Mayer, MD;  Location: [facility] ENDOSCOPY;  Service: Endoscopy;  Laterality: N/A; 04/05/14 - Procedure: UPPER ENDOSCOPIC ULTRASOUND (EUS) RADIAL;  Surgeon: [name], MD;  Location: [facility] ENDOSCOPY;  Service: Endoscopy;  Laterality: N/A;  Past/Anticipated interventions by medical oncology, if any: treatment with Xeloda and radiation.  Weight changes, if any: has lost 12 lbs since June 2015  Bowel/Bladder complaints, if any: constipation - takes miralax - has to strain, last bm was today and was small.  Nausea / Vomiting, if any: no  Pain issues, if any:  Yes - has had pain in her upper abdomen and mid back - rating at a 7/10.  She is taking oxycodone 5 mg q 4 hours and motrin PRN  SAFETY ISSUES:  Prior radiation? no  Pacemaker/ICD? no  Possible current pregnancy? no  Is the patient on methotrexate? no  Current Complaints / other details:  Patient saw Dr.  Barry Dienes on 7/24 and the plan is for a CT and potential surgery after chemoradiation is completed.  Patient is here with her daughter. She is a current smoker (.25 pack per day) and quit drinking when she had jaundice in June.

## 2014-04-13 NOTE — Assessment & Plan Note (Signed)
Pt to start neoadjuvant chemoradiation at the end of July and continue for 5 weeks.  I will see her shortly after radiation completion and we will set up CT and potential surgery.  I discussed the surgery with the patient including diagrams of anatomy.  I discussed the potential for diagnostic laparoscopy.  In the case of pancreatic cancer, if spread of the disease is found, we will abort the procedure and not proceed with resection.  The rationale for this was discussed with the patient.  There has not been data to support resection of Stage IV disease in terms of survival benefit.    We discussed possible complications including: Potential of aborting procedure if tumor is invading the superior mesenteric or hepatic arteries Bleeding Infection and possible wound complications such as hernia Damage to adjacent structures Leak of anastamoses, primarily pancreatic Possible need for other procedures Possible prolonged hospital stay Possible development of diabetes or worsening of current diabetes.  Possible pancreatic exocrine insufficiency Prolonged fatigue/weakness/appetite Difficulty with eating or post operative nausea Possible early recurrence of cancer   The patient understands and wishes to proceed.  The patient has been advised to turn in disability paperwork to our office.

## 2014-04-13 NOTE — Progress Notes (Signed)
[place] Work  Clinical Social Work is following for transportation concerns and support due to new cancer diagnosis.  Clinical Social Worker met with patient and her daughter at [facility] prior to appointment on 04/12/14 to offer support and assess for needs.  Pt does lives at [facility] on [address]. She lives alone, but has support from her daughter who brought her here today. Pt often uses the bus and medicaid transportation to get to medical appointments. We briefly discussed [facility] services and pt is open to applying for this resource at a later appointment with CSW.  Pt did receive information that CSW had mailed re. services here and resources to assist.   Pt's mother passed away about one month ago and this is still a concern and a source of stress. Pt's mother lived in the same apartment building with her, down the hall from her, so this is a constant reminder of her passing. Pt is aware of free grief counseling through hospice. Pt open to meeting with CSW for additional supportive counseling as well. CSW phoned pt today to set up additional meeting at future appointment. CSW awaits return call and will also attempt to connect at first chemo if pt does not respond prior.   Clinical Social Work interventions: Archivist to pt and daughter  Loren Racer, [place] Social Worker [facility] for [place] Wednesday, Thursday and Friday Phone: [phone] Fax: [phone]

## 2014-04-13 NOTE — Progress Notes (Signed)
Chief Complaint  Patient presents with  . Other    Eval pancreatic adenocarcinoma    HISTORY: Pt is a 63 yo F who is referred by Dr. Ardis Hughs for consultation for new diagnosis of pancreatic cancer.  She presented with abdominal pain and jaundice in June of 2015.  She had CT with possible mass and double duct sign.  She has lost around 10 pounds.  Her mother also died this summer and she has had problems with alcohol abuse.  MRI/EUS confirmed mass and demonstrated invasion of portal vein.  She is feeling quite a bit better since she got stented.  She has seen Drs. Sherrill and Kinard and is starting chemoradiation in around 1 week.    Past Medical History  Diagnosis Date  . Arthritis   . Depression   . H/O: substance abuse   . Back pain   . Personal history of colonic polyp - adenoma 10/26/2013    10/26/2013 diminutive sigmoid polyp removed    . Pancreatitis, acute   . Hypertension   . Osteoporosis     Past Surgical History  Procedure Laterality Date  . Lumbar epidural injection    . Colonoscopy    . Ercp N/A 03/06/2014    Procedure: ENDOSCOPIC RETROGRADE CHOLANGIOPANCREATOGRAPHY (ERCP);  Surgeon: Gatha Mayer, MD;  Location[facility] ENDOSCOPY;  Service: Endoscopy;  Laterality: N/A;  . Eus N/A 04/05/2014    Procedure: UPPER ENDOSCOPIC ULTRASOUND (EUS) RADIAL;  Surgeon: [name], MD;  Location: [facility] ENDOSCOPY;  Service: Endoscopy;  Laterality: N/A;    Current Outpatient Prescriptions  Medication Sig Dispense Refill  . ibuprofen (ADVIL,MOTRIN) 800 MG tablet Take 800 mg by mouth 2 (two) times daily as needed for mild pain or moderate pain.      Marland Kitchen ondansetron (ZOFRAN) 4 MG tablet Take 1 tablet (4 mg total) by mouth every 6 (six) hours as needed for nausea.  30 tablet  0  . oxyCODONE (OXY IR/ROXICODONE) 5 MG immediate release tablet Take 1 tablet (5 mg total) by mouth every 4 (four) hours as needed for severe pain.  60 tablet  0  . polyethylene glycol (MIRALAX / GLYCOLAX) packet Take 17 g  by mouth daily.      . sertraline (ZOLOFT) 100 MG tablet Take 100 mg by mouth every morning.       . metoCLOPramide (REGLAN) 10 MG tablet Take 1 tablet (10 mg total) by mouth every 6 (six) hours.  10 tablet  0  . omeprazole (PRILOSEC) 20 MG capsule Take 40 mg by mouth daily.       No current facility-administered medications for this visit.     No Known Allergies   Family History  Problem Relation Age of Onset  . Colon cancer Neg Hx   . Esophageal cancer Neg Hx   . Rectal cancer Neg Hx   . Stomach cancer Neg Hx   . CAD Mother   . Cancer Father   . Cirrhosis Sister     she is a heavy drinker.      History   Social History  . Marital Status: Legally Separated    Spouse Name: N/A    Number of Children: N/A  . Years of Education: N/A   Social History Main Topics  . Smoking status: Current Every Day Smoker -- 0.25 packs/day for 40 years    Types: Cigarettes  . Smokeless tobacco: Current User  . Alcohol Use: No     Comment: occasionally. Quit  , none  in 3weeks"only drank before  to help with pain before ERD visit"  . Drug Use: No  . Sexual Activity: Yes   Other Topics Concern  . None   Social History Narrative  . None     REVIEW OF SYSTEMS - PERTINENT POSITIVES ONLY: 12 point review of systems negative other than HPI and PMH except for constipation, headaches, joint pain.    EXAM: Filed Vitals:   04/13/14 1142  BP: 182/80  Pulse: 60  Resp: 16    Wt Readings from Last 3 Encounters:  04/13/14 133 lb 12.8 oz (60.691 kg)  04/12/14 135 lb (61.236 kg)  04/05/14 145 lb (65.772 kg)     Gen:  No acute distress.  Well nourished and well groomed.  Several teeth missing in front.   Neurological: Alert and oriented to person, place, and time. Coordination normal.  Head: Normocephalic and atraumatic.  Eyes: Conjunctivae are normal. Pupils are equal, round, and reactive to light. No scleral icterus.  Neck: Normal range of motion. Neck supple. No tracheal deviation  or thyromegaly present.  Cardiovascular: Normal rate, regular rhythm, normal heart sounds and intact distal pulses.  Exam reveals no gallop and no friction rub.  No murmur heard. Respiratory: Effort normal.  No respiratory distress. No chest wall tenderness. Breath sounds normal.  No wheezes, rales or rhonchi.  GI: Soft. Bowel sounds are normal. The abdomen is soft and nontender.  There is no rebound and no guarding. no abdominal scars. Musculoskeletal: Normal range of motion. Extremities are nontender.  Lymphadenopathy: No cervical, preauricular, postauricular or axillary adenopathy is present Skin: Skin is warm and dry. No rash noted. No diaphoresis. No erythema. No pallor. No clubbing, cyanosis, or edema.   Psychiatric: Normal mood and affect. Behavior is normal. Judgment and thought content normal.    LABORATORY RESULTS: Available labs are reviewed   Recent Results (from the past 2160 hour(s))  CBC WITH DIFFERENTIAL     Status: Abnormal   Collection Time    03/04/14  3:10 PM      Result Value Ref Range   WBC 5.8  4.0 - 10.5 K/uL   RBC 4.19  3.87 - 5.11 MIL/uL   Hemoglobin 13.0  12.0 - 15.0 g/dL   HCT 37.2  36.0 - 46.0 %   MCV 88.8  78.0 - 100.0 fL   MCH 31.0  26.0 - 34.0 pg   MCHC 34.9  30.0 - 36.0 g/dL   RDW 17.5 (*) 11.5 - 15.5 %   Platelets 202  150 - 400 K/uL   Neutrophils Relative % 61  43 - 77 %   Neutro Abs 3.6  1.7 - 7.7 K/uL   Lymphocytes Relative 32  12 - 46 %   Lymphs Abs 1.9  0.7 - 4.0 K/uL   Monocytes Relative 6  3 - 12 %   Monocytes Absolute 0.3  0.1 - 1.0 K/uL   Eosinophils Relative 1  0 - 5 %   Eosinophils Absolute 0.1  0.0 - 0.7 K/uL   Basophils Relative 0  0 - 1 %   Basophils Absolute 0.0  0.0 - 0.1 K/uL  COMPREHENSIVE METABOLIC PANEL     Status: Abnormal   Collection Time    03/04/14  3:10 PM      Result Value Ref Range   Sodium 139  137 - 147 mEq/L   Potassium 3.1 (*) 3.7 - 5.3 mEq/L   Chloride 101  96 - 112 mEq/L   CO2 22  19 - 32 mEq/L   Glucose,  Bld 82  70 - 99 mg/dL   BUN 9  6 - 23 mg/dL   Creatinine, Ser 0.49 (*) 0.50 - 1.10 mg/dL   Comment: ICTERUS AT THIS LEVEL MAY AFFECT RESULT   Calcium 9.9  8.4 - 10.5 mg/dL   Total Protein 9.6 (*) 6.0 - 8.3 g/dL   Comment: ICTERUS AT THIS LEVEL MAY AFFECT RESULT   Albumin 3.4 (*) 3.5 - 5.2 g/dL   AST 131 (*) 0 - 37 U/L   ALT 93 (*) 0 - 35 U/L   Alkaline Phosphatase 569 (*) 39 - 117 U/L   Total Bilirubin 18.3 (*) 0.3 - 1.2 mg/dL   Comment: CRITICAL RESULT CALLED TO, READ BACK BY AND VERIFIED WITH:     MARCELLA HAIRSTON,RN AT 7628 03/04/14 BY ZBEECH.   GFR calc non Af Amer >90  >90 mL/min   GFR calc Af Amer >90  >90 mL/min   Comment: (NOTE)     The eGFR has been calculated using the CKD EPI equation.     This calculation has not been validated in all clinical situations.     eGFR's persistently <90 mL/min signify possible Chronic Kidney     Disease.  LIPASE, BLOOD     Status: None   Collection Time    03/04/14  3:10 PM      Result Value Ref Range   Lipase 32  11 - 59 U/L  I-STAT TROPOININ, ED     Status: None   Collection Time    03/04/14  3:20 PM      Result Value Ref Range   Troponin i, poc 0.00  0.00 - 0.08 ng/mL   Comment 3            Comment: Due to the release kinetics of cTnI,     a negative result within the first hours     of the onset of symptoms does not rule out     myocardial infarction with certainty.     If myocardial infarction is still suspected,     repeat the test at appropriate intervals.  HEPATITIS PANEL, ACUTE     Status: Abnormal   Collection Time    03/04/14  4:50 PM      Result Value Ref Range   Hepatitis B Surface Ag NEGATIVE  NEGATIVE   HCV Ab Reactive (*) NEGATIVE   Comment: (NOTE)                                                                               This test is for screening purposes only.  Reactive results should be     confirmed by an alternative method.  Suggest HCV Qualitative, PCR,     test code 83130.  Specimens will be stable  for reflex testing up to 3     days after collection.   Hep A IgM NON REACTIVE  NON REACTIVE   Hep B C IgM NON REACTIVE  NON REACTIVE   Comment: (NOTE)     High levels of Hepatitis B Core IgM antibody are detectable     during the acute stage of Hepatitis B. This antibody  is used     to differentiate current from past HBV infection.     Performed at [facility]     Status: None   Collection Time    03/04/14  9:35 PM      Result Value Ref Range   Prothrombin Time 14.1  11.6 - 15.2 seconds   INR 1.11  0.00 - 1.49  URINALYSIS, ROUTINE W REFLEX MICROSCOPIC     Status: Abnormal   Collection Time    03/04/14  9:55 PM      Result Value Ref Range   Color, Urine AMBER (*) YELLOW   Comment: BIOCHEMICALS MAY BE AFFECTED BY COLOR   APPearance CLEAR  CLEAR   Specific Gravity, Urine 1.005  1.005 - 1.030   pH 5.5  5.0 - 8.0   Glucose, UA NEGATIVE  NEGATIVE mg/dL   Hgb urine dipstick NEGATIVE  NEGATIVE   Bilirubin Urine LARGE (*) NEGATIVE   Ketones, ur NEGATIVE  NEGATIVE mg/dL   Protein, ur NEGATIVE  NEGATIVE mg/dL   Urobilinogen, UA 0.2  0.0 - 1.0 mg/dL   Nitrite NEGATIVE  NEGATIVE   Leukocytes, UA NEGATIVE  NEGATIVE   Comment: MICROSCOPIC NOT DONE ON URINES WITH NEGATIVE PROTEIN, BLOOD, LEUKOCYTES, NITRITE, OR GLUCOSE <1000 mg/dL.  GLUCOSE, CAPILLARY     Status: Abnormal   Collection Time    03/05/14 12:08 AM      Result Value Ref Range   Glucose-Capillary 115 (*) 70 - 99 mg/dL   Comment 1 Notify RN     Comment 2 Documented in Chart    GLUCOSE, CAPILLARY     Status: None   Collection Time    03/05/14  6:18 AM      Result Value Ref Range   Glucose-Capillary 74  70 - 99 mg/dL  HEPATIC FUNCTION PANEL     Status: Abnormal   Collection Time    03/05/14  6:19 AM      Result Value Ref Range   Total Protein 8.0  6.0 - 8.3 g/dL   Albumin 2.6 (*) 3.5 - 5.2 g/dL   AST 102 (*) 0 - 37 U/L   ALT 70 (*) 0 - 35 U/L   Alkaline Phosphatase 469 (*) 39 - 117 U/L   Total  Bilirubin 16.2 (*) 0.3 - 1.2 mg/dL   Comment: ICTERIC SPECIMEN   Bilirubin, Direct 11.8 (*) 0.0 - 0.3 mg/dL   Comment: RESULTS CONFIRMED BY MANUAL DILUTION   Indirect Bilirubin 4.4 (*) 0.3 - 0.9 mg/dL  LIPASE, BLOOD     Status: None   Collection Time    03/05/14  6:19 AM      Result Value Ref Range   Lipase 22  11 - 59 U/L  PROTIME-INR     Status: None   Collection Time    03/05/14  6:19 AM      Result Value Ref Range   Prothrombin Time 14.6  11.6 - 15.2 seconds   INR 1.16  0.00 - 4.70  BASIC METABOLIC PANEL     Status: Abnormal   Collection Time    03/05/14  6:19 AM      Result Value Ref Range   Sodium 140  137 - 147 mEq/L   Potassium 3.9  3.7 - 5.3 mEq/L   Chloride 104  96 - 112 mEq/L   CO2 21  19 - 32 mEq/L   Glucose, Bld 84  70 - 99 mg/dL   BUN 7  6 -  23 mg/dL   Creatinine, Ser 0.49 (*) 0.50 - 1.10 mg/dL   Comment: ICTERUS AT THIS LEVEL MAY AFFECT RESULT   Calcium 9.2  8.4 - 10.5 mg/dL   GFR calc non Af Amer >90  >90 mL/min   GFR calc Af Amer >90  >90 mL/min   Comment: (NOTE)     The eGFR has been calculated using the CKD EPI equation.     This calculation has not been validated in all clinical situations.     eGFR's persistently <90 mL/min signify possible Chronic Kidney     Disease.  CBC     Status: Abnormal   Collection Time    03/05/14  6:19 AM      Result Value Ref Range   WBC 5.1  4.0 - 10.5 K/uL   RBC 3.71 (*) 3.87 - 5.11 MIL/uL   Hemoglobin 11.3 (*) 12.0 - 15.0 g/dL   HCT 33.3 (*) 36.0 - 46.0 %   MCV 89.8  78.0 - 100.0 fL   MCH 30.5  26.0 - 34.0 pg   MCHC 33.9  30.0 - 36.0 g/dL   RDW 17.5 (*) 11.5 - 15.5 %   Platelets 170  150 - 400 K/uL  HCV RNA QUANT     Status: Abnormal   Collection Time    03/05/14 10:59 AM      Result Value Ref Range   HCV Quantitative [id number] (*) <15 IU/mL   HCV Quantitative Log 6.05 (*) <1.18 log 10   Comment: (NOTE)     This test utilizes the [place] FDA approved Roche HCV Test Kit by RT-PCR.     Performed at [facility]     Status: Abnormal   Collection Time    03/05/14 10:59 AM      Result Value Ref Range   CA 19-9 38.3 (*) <35.0 U/mL   Comment: Performed at [facility], [facility]     Status: None   Collection Time    03/05/14 12:14 PM      Result Value Ref Range   Glucose-Capillary 80  70 - 99 mg/dL  CBC     Status: Abnormal   Collection Time    03/06/14  6:37 AM      Result Value Ref Range   WBC 5.3  4.0 - 10.5 K/uL   RBC 3.66 (*) 3.87 - 5.11 MIL/uL   Hemoglobin 11.1 (*) 12.0 - 15.0 g/dL   HCT 32.5 (*) 36.0 - 46.0 %   MCV 88.8  78.0 - 100.0 fL   MCH 30.3  26.0 - 34.0 pg   MCHC 34.2  30.0 - 36.0 g/dL   RDW 17.4 (*) 11.5 - 15.5 %   Platelets 165  150 - 400 K/uL  BASIC METABOLIC PANEL     Status: Abnormal   Collection Time    03/06/14  6:37 AM      Result Value Ref Range   Sodium 140  137 - 147 mEq/L   Potassium 3.5 (*) 3.7 - 5.3 mEq/L   Chloride 103  96 - 112 mEq/L   CO2 24  19 - 32 mEq/L   Glucose, Bld 127 (*) 70 - 99 mg/dL   BUN 10  6 - 23 mg/dL   Creatinine, Ser 0.55  0.50 - 1.10 mg/dL   Comment: ICTERUS AT THIS LEVEL MAY AFFECT RESULT   Calcium 9.4  8.4 - 10.5 mg/dL   GFR calc non Af Amer >  90  >90 mL/min   GFR calc Af Amer >90  >90 mL/min   Comment: (NOTE)     The eGFR has been calculated using the CKD EPI equation.     This calculation has not been validated in all clinical situations.     eGFR's persistently <90 mL/min signify possible Chronic Kidney     Disease.  HEPATIC FUNCTION PANEL     Status: Abnormal   Collection Time    03/06/14  6:37 AM      Result Value Ref Range   Total Protein 7.8  6.0 - 8.3 g/dL   Albumin 2.6 (*) 3.5 - 5.2 g/dL   AST 93 (*) 0 - 37 U/L   ALT 68 (*) 0 - 35 U/L   Alkaline Phosphatase 460 (*) 39 - 117 U/L   Total Bilirubin 15.8 (*) 0.3 - 1.2 mg/dL   Bilirubin, Direct 11.1 (*) 0.0 - 0.3 mg/dL   Comment: RESULTS CONFIRMED BY MANUAL DILUTION   Indirect Bilirubin 5.0 (*) 0.3 - 0.9 mg/dL  CBC      Status: Abnormal   Collection Time    03/07/14  7:30 AM      Result Value Ref Range   WBC 5.0  4.0 - 10.5 K/uL   RBC 3.75 (*) 3.87 - 5.11 MIL/uL   Hemoglobin 11.6 (*) 12.0 - 15.0 g/dL   HCT 35.0 (*) 36.0 - 46.0 %   MCV 93.3  78.0 - 100.0 fL   MCH 30.9  26.0 - 34.0 pg   MCHC 33.1  30.0 - 36.0 g/dL   RDW 17.2 (*) 11.5 - 15.5 %   Platelets 181  150 - 400 K/uL  COMPREHENSIVE METABOLIC PANEL     Status: Abnormal   Collection Time    03/07/14  7:30 AM      Result Value Ref Range   Sodium 142  137 - 147 mEq/L   Potassium 3.3 (*) 3.7 - 5.3 mEq/L   Chloride 102  96 - 112 mEq/L   CO2 28  19 - 32 mEq/L   Glucose, Bld 132 (*) 70 - 99 mg/dL   BUN 11  6 - 23 mg/dL   Creatinine, Ser 0.54  0.50 - 1.10 mg/dL   Calcium 9.3  8.4 - 10.5 mg/dL   Total Protein 7.8  6.0 - 8.3 g/dL   Albumin 2.7 (*) 3.5 - 5.2 g/dL   AST 116 (*) 0 - 37 U/L   ALT 77 (*) 0 - 35 U/L   Alkaline Phosphatase 484 (*) 39 - 117 U/L   Total Bilirubin 11.7 (*) 0.3 - 1.2 mg/dL   Comment: REPEATED TO VERIFY   GFR calc non Af Amer >90  >90 mL/min   GFR calc Af Amer >90  >90 mL/min   Comment: (NOTE)     The eGFR has been calculated using the CKD EPI equation.     This calculation has not been validated in all clinical situations.     eGFR's persistently <90 mL/min signify possible Chronic Kidney     Disease.  AMYLASE     Status: Abnormal   Collection Time    03/07/14  7:30 AM      Result Value Ref Range   Amylase 617 (*) 0 - 105 U/L  LIPASE, BLOOD     Status: Abnormal   Collection Time    03/07/14  7:30 AM      Result Value Ref Range   Lipase 984 (*) 11 - 59 U/L  CBC WITH DIFFERENTIAL     Status: Abnormal   Collection Time    03/18/14  1:24 AM      Result Value Ref Range   WBC 6.2  4.0 - 10.5 K/uL   RBC 4.13  3.87 - 5.11 MIL/uL   Hemoglobin 12.8  12.0 - 15.0 g/dL   HCT 38.1  36.0 - 46.0 %   MCV 92.3  78.0 - 100.0 fL   MCH 31.0  26.0 - 34.0 pg   MCHC 33.6  30.0 - 36.0 g/dL   RDW 14.8  11.5 - 15.5 %   Platelets  282  150 - 400 K/uL   Neutrophils Relative % 35 (*) 43 - 77 %   Neutro Abs 2.1  1.7 - 7.7 K/uL   Lymphocytes Relative 54 (*) 12 - 46 %   Lymphs Abs 3.4  0.7 - 4.0 K/uL   Monocytes Relative 7  3 - 12 %   Monocytes Absolute 0.4  0.1 - 1.0 K/uL   Eosinophils Relative 3  0 - 5 %   Eosinophils Absolute 0.2  0.0 - 0.7 K/uL   Basophils Relative 1  0 - 1 %   Basophils Absolute 0.0  0.0 - 0.1 K/uL  COMPREHENSIVE METABOLIC PANEL     Status: Abnormal   Collection Time    03/18/14  1:24 AM      Result Value Ref Range   Sodium 141  137 - 147 mEq/L   Potassium 3.2 (*) 3.7 - 5.3 mEq/L   Chloride 101  96 - 112 mEq/L   CO2 24  19 - 32 mEq/L   Glucose, Bld 101 (*) 70 - 99 mg/dL   BUN 12  6 - 23 mg/dL   Creatinine, Ser 0.54  0.50 - 1.10 mg/dL   Calcium 9.4  8.4 - 10.5 mg/dL   Total Protein 9.7 (*) 6.0 - 8.3 g/dL   Albumin 3.1 (*) 3.5 - 5.2 g/dL   AST 86 (*) 0 - 37 U/L   ALT 55 (*) 0 - 35 U/L   Alkaline Phosphatase 408 (*) 39 - 117 U/L   Total Bilirubin 4.5 (*) 0.3 - 1.2 mg/dL   GFR calc non Af Amer >90  >90 mL/min   GFR calc Af Amer >90  >90 mL/min   Comment: (NOTE)     The eGFR has been calculated using the CKD EPI equation.     This calculation has not been validated in all clinical situations.     eGFR's persistently <90 mL/min signify possible Chronic Kidney     Disease.  LIPASE, BLOOD     Status: None   Collection Time    03/18/14  1:24 AM      Result Value Ref Range   Lipase 33  11 - 59 U/L  URINALYSIS, ROUTINE W REFLEX MICROSCOPIC     Status: Abnormal   Collection Time    03/18/14  4:15 AM      Result Value Ref Range   Color, Urine YELLOW  YELLOW   APPearance CLEAR  CLEAR   Specific Gravity, Urine 1.012  1.005 - 1.030   pH 6.0  5.0 - 8.0   Glucose, UA NEGATIVE  NEGATIVE mg/dL   Hgb urine dipstick NEGATIVE  NEGATIVE   Bilirubin Urine NEGATIVE  NEGATIVE   Ketones, ur NEGATIVE  NEGATIVE mg/dL   Protein, ur NEGATIVE  NEGATIVE mg/dL   Urobilinogen, UA 2.0 (*) 0.0 - 1.0 mg/dL    Nitrite NEGATIVE  NEGATIVE  Leukocytes, UA NEGATIVE  NEGATIVE   Comment: MICROSCOPIC NOT DONE ON URINES WITH NEGATIVE PROTEIN, BLOOD, LEUKOCYTES, NITRITE, OR GLUCOSE <1000 mg/dL.     RADIOLOGY RESULTS: See E-Chart or I-Site for most recent results.  Images and reports are reviewed.  US Abdomen Complete  03/18/2014   CLINICAL DATA:  Nausea and vomiting. Pancreatitis. Upper abdominal pain.  EXAM: ULTRASOUND ABDOMEN COMPLETE  COMPARISON:  MRI 03/05/2014  FINDINGS: Gallbladder:  Minimal sludge layering within the gallbladder. No cholelithiasis. No wall thickening or focal tenderness.  Common bile duct:  Diameter: 10 mm maximal diameter. A plastic stent is seen within the lumen of the common bile duct the level of the porta hepatis. The stent is not seen below the pancreatic mass, but does appear in good position on preceding radiography. There is intrahepatic biliary ductal dilatation which is stable or mildly decreased from prior.  Liver:  No focal lesion identified. Within normal limits in parenchymal echogenicity.  IVC:  No abnormality visualized.  Pancreas:  Pancreatic head mass as recently characterize by MRI, measuring nearly 2 cm. There is chronic ductal obstruction.  Spleen:  Size and appearance within normal limits.  Right Kidney:  Length: 11 cm. Echogenicity within normal limits. No mass or hydronephrosis visualized.  Left Kidney:  Length: 11 cm. Echogenicity within normal limits. No mass or hydronephrosis visualized.  Abdominal aorta:  No aneurysm visualized.  Other findings:  None.  IMPRESSION: 1. No acute sonographic abnormality. 2. Stable pancreatic and ductal dilation secondary to a pancreatic head mass. Status post biliary stenting.   Electronically Signed   By: Jorje Guild M.D.   On: 03/18/2014 04:35   Dg Abd 2 Views  03/18/2014   CLINICAL DATA:  Nausea and vomiting after drinking, history of pancreatitis and biliary stent.  EXAM: ABDOMEN - 2 VIEW  COMPARISON:  CT of the abdomen and  pelvis March 04, 2014  FINDINGS: The bowel gas pattern is normal. There is no evidence of free air. No radio-opaque calculi or other significant radiographic abnormality is seen.  IMPRESSION: Moderate amount of retained large bowel stool without obstruction.   Electronically Signed   By: Elon Alas   On: 03/18/2014 03:28      ASSESSMENT AND PLAN: Pancreatic cancer Pt to start neoadjuvant chemoradiation at the end of July and continue for 5 weeks.  I will see her shortly after radiation completion and we will set up CT and potential surgery.  I discussed the surgery with the patient including diagrams of anatomy.  I discussed the potential for diagnostic laparoscopy.  In the case of pancreatic cancer, if spread of the disease is found, we will abort the procedure and not proceed with resection.  The rationale for this was discussed with the patient.  There has not been data to support resection of Stage IV disease in terms of survival benefit.    We discussed possible complications including: Potential of aborting procedure if tumor is invading the superior mesenteric or hepatic arteries Bleeding Infection and possible wound complications such as hernia Damage to adjacent structures Leak of anastamoses, primarily pancreatic Possible need for other procedures Possible prolonged hospital stay Possible development of diabetes or worsening of current diabetes.  Possible pancreatic exocrine insufficiency Prolonged fatigue/weakness/appetite Difficulty with eating or post operative nausea Possible early recurrence of cancer   The patient understands and wishes to proceed.  The patient has been advised to turn in disability paperwork to our office.         Ebelin Dillehay L  Barry Dienes MD Surgical Oncology, [facility] and [facility]      Visit Diagnoses: 1. Malignant neoplasm of head of pancreas     Primary Care Physician: Kevan Ny, MD [place] [place] [facility]

## 2014-04-16 ENCOUNTER — Ambulatory Visit
Admission: RE | Admit: 2014-04-16 | Discharge: 2014-04-16 | Disposition: A | Discharge status: Home / Self Care | Payer: Medicaid Other | Source: Ambulatory Visit | Attending: Radiation Oncology | Admitting: Radiation Oncology

## 2014-04-16 ENCOUNTER — Encounter: Payer: Self-pay | Admitting: Radiation Oncology

## 2014-04-16 VITALS — BP 144/91 | HR 67 | Temp 98.4°F | Ht 63.0 in | Wt 133.0 lb

## 2014-04-16 DIAGNOSIS — Z860100 Personal history of colon polyps, unspecified: Secondary | ICD-10-CM | POA: Insufficient documentation

## 2014-04-16 DIAGNOSIS — Z510 Encounter for antineoplastic radiation therapy: Secondary | ICD-10-CM | POA: Insufficient documentation

## 2014-04-16 DIAGNOSIS — M549 Dorsalgia, unspecified: Secondary | ICD-10-CM | POA: Insufficient documentation

## 2014-04-16 DIAGNOSIS — C250 Malignant neoplasm of head of pancreas: Secondary | ICD-10-CM | POA: Insufficient documentation

## 2014-04-16 DIAGNOSIS — R109 Unspecified abdominal pain: Secondary | ICD-10-CM | POA: Insufficient documentation

## 2014-04-16 DIAGNOSIS — I10 Essential (primary) hypertension: Secondary | ICD-10-CM | POA: Diagnosis not present

## 2014-04-16 DIAGNOSIS — Z79899 Other long term (current) drug therapy: Secondary | ICD-10-CM | POA: Diagnosis not present

## 2014-04-16 NOTE — Progress Notes (Signed)
Please see the Nurse Progress Note in the MD Initial Consult Encounter for this patient. 

## 2014-04-16 NOTE — Progress Notes (Signed)
Radiation Oncology         [phone] ________________________________  Initial outpatient Consultation  Name: Melinda Conrad MRN: [id number]  Date: 04/16/2014  DOB: Aug 20, 1951  NG:EXBM, ERIC, MD  Rogers Blocker, MD   REFERRING PHYSICIAN: Rogers Blocker, MD  DIAGNOSIS: Pancreatic cancer   Primary site: Pancreas   Staging method: AJCC 7th Edition   Clinical: Stage IIA (T3, N0, M0)    Summary: Stage IIA (T3, N0, M0)  HISTORY OF PRESENT ILLNESS::Melinda Conrad is a 63 y.o. female who is seen out of the courtesy of Dr. Julieanne Manson for an opinion concerning radiation therapy as part of management of patient's recently diagnosed pancreatic cancer. Patient presented recently with abdominal pain and subsequent jaundice. She was found to have an elevated bilirubin. A CT scan of the abdomen and pelvis in mid June revealed intrahepatic and extrahepatic biliary ductal dilation. A low density lesion was noted in the region of the pancreatic head measuring approximately 2.4 cm. Patient underwent ERCP and the distal common bile duct stricture was noted. Brushings from this area confirmed malignant cells consistent with adenocarcinoma. A plastic stent was placed. The MRI of the abdomen confirmed a mass within the pancreatic head measuring 1.7 x 2.2 cm.. The mass did a but the undersurface of the portal vein but did not obviously involve the celiac trunk or branches or superior mesenteric artery. She was referred to Dr. Ardis Hughs and an endoscopic ultrasound on 04/05/2014 confirmed a 2.5 cm mass in the head of the pancreas with a biliary stent in good position. Clear loss of normal tissue interface between the mass and a portal vein along 1.5 cm suggestive of invasion. The mass does not involve the celiac trunk or superior mesenteric artery. No peripancreatic adenopathy. The mass was staged as a uT3N0 lesion.  The patient was seen by Gen. surgery and felt to  be a good candidate for neoadjuvant chemoradiation  with consideration for potential surgery.   PREVIOUS RADIATION THERAPY: No  PAST MEDICAL HISTORY:  has a past medical history of Arthritis; Depression; H/O: substance abuse; Back pain; Personal history of colonic polyp - adenoma (10/26/2013); Pancreatitis, acute; Hypertension; and Osteoporosis.    PAST SURGICAL HISTORY: Past Surgical History  Procedure Laterality Date  . Lumbar epidural injection    . Colonoscopy    . Ercp N/A 03/06/2014    Procedure: ENDOSCOPIC RETROGRADE CHOLANGIOPANCREATOGRAPHY (ERCP);  Surgeon: Gatha Mayer, MD;  Location: [facility] ENDOSCOPY;  Service: Endoscopy;  Laterality: N/A;  . Eus N/A 04/05/2014    Procedure: UPPER ENDOSCOPIC ULTRASOUND (EUS) RADIAL;  Surgeon: [name], MD;  Location: [facility] ENDOSCOPY;  Service: Endoscopy;  Laterality: N/A;    FAMILY HISTORY: family history includes CAD in her mother; Cancer in her father; Cirrhosis in her sister. There is no history of Colon cancer, Esophageal cancer, Rectal cancer, or Stomach cancer.  SOCIAL HISTORY:  reports that she has been smoking Cigarettes.  She has a 10 pack-year smoking history. She uses smokeless tobacco. She reports that she does not drink alcohol or use illicit drugs.  ALLERGIES: Review of patient's allergies indicates no known allergies.  MEDICATIONS:  Current Outpatient Prescriptions  Medication Sig Dispense Refill  . amLODipine (NORVASC) 5 MG tablet Take 5 mg by mouth daily.      Marland Kitchen CALCIUM-VITAMIN D PO Take 1 capsule by mouth daily.      Marland Kitchen ibuprofen (ADVIL,MOTRIN) 800 MG tablet Take 800 mg by mouth 2 (two) times daily as needed for mild  pain or moderate pain.      Marland Kitchen oxyCODONE (OXY IR/ROXICODONE) 5 MG immediate release tablet Take 1 tablet (5 mg total) by mouth every 4 (four) hours as needed for severe pain.  60 tablet  0  . polyethylene glycol (MIRALAX / GLYCOLAX) packet Take 17 g by mouth daily.      . metoCLOPramide (REGLAN) 10 MG tablet Take 1 tablet (10 mg total) by mouth every 6 (six) hours.   10 tablet  0  . omeprazole (PRILOSEC) 20 MG capsule Take 40 mg by mouth daily.      . ondansetron (ZOFRAN) 4 MG tablet Take 1 tablet (4 mg total) by mouth every 6 (six) hours as needed for nausea.  30 tablet  0  . sertraline (ZOLOFT) 100 MG tablet Take 100 mg by mouth every morning.        No current facility-administered medications for this encounter.    REVIEW OF SYSTEMS:  A 15 point review of systems is documented in the electronic medical record. This was obtained by the nursing staff. However, I reviewed this with the patient to discuss relevant findings and make appropriate changes.  She has had resolution of her jaundice since stent placement but now has abdominal pain and mid back pain which began over the past weekend.  The patient has had difficulty sleeping in light of this issue.  She does have Percocet for pain control. Her appetite has been off. She denies any headaches or visual problems.   PHYSICAL EXAM:  height is 5\' 3"  (1.6 m) and weight is 133 lb (60.328 kg). Her oral temperature is 98.4 F (36.9 C). Her blood pressure is 144/91 and her pulse is 67.   BP 144/91  Pulse 67  Temp(Src) 98.4 F (36.9 C) (Oral)  Ht 5\' 3"  (1.6 m)  Wt 133 lb (60.328 kg)  BMI 23.57 kg/m2  General Appearance:    Alert, cooperative, no distress, appears stated age, accompanied by her daughter on evaluation today   Head:    Normocephalic, without obvious abnormality, atraumatic  Eyes:    PERRL, conjunctiva/corneas clear, EOM's intact,     Ears:    Normal TM's and external ear canals, both ears  Nose:   Nares normal, septum midline, mucosa normal, no drainage    or sinus tenderness  Throat:   Lips, mucosa, and tongue normal; teeth- poor dentition with several teeth missing,  gums normal  Neck:   Supple, symmetrical, trachea midline, no adenopathy;    thyroid:  no enlargement/tenderness/nodules; no carotid   bruit or JVD  Back:     Symmetric, no curvature, ROM normal, no CVA tenderness  Lungs:      Clear to auscultation bilaterally, respirations unlabored  Chest Wall:    No tenderness or deformity   Heart:    Regular rate and rhythm, S1 and S2 normal, no murmur, rub   or gallop     Abdomen:     Soft, non-tender, bowel sounds active all four quadrants,    no masses, no organomegaly, mild epigastric pain. No palpable mass no rebound or guarding         Extremities:   Extremities normal, atraumatic, no cyanosis or edema  Pulses:   2+ and symmetric all extremities  Skin:   Skin color, texture, turgor normal, no rashes or lesions  Lymph nodes:   Cervical, supraclavicular, and axillary nodes normal  Neurologic:    normal strength, sensation and reflexes    throughout  ECOG = 1   1 - Symptomatic but completely ambulatory (Restricted in physically strenuous activity but ambulatory and able to carry out work of a light or sedentary nature. For example, light housework, office work)   LABORATORY DATA:  Lab Results  Component Value Date   WBC 6.2 03/18/2014   HGB 12.8 03/18/2014   HCT 38.1 03/18/2014   MCV 92.3 03/18/2014   PLT 282 03/18/2014   NEUTROABS 2.1 03/18/2014   Lab Results  Component Value Date   NA 141 03/18/2014   K 3.2* 03/18/2014   CL 101 03/18/2014   CO2 24 03/18/2014   GLUCOSE 101* 03/18/2014   CREATININE 0.54 03/18/2014   CALCIUM 9.4 03/18/2014      RADIOGRAPHY: US Abdomen Complete  03/18/2014   CLINICAL DATA:  Nausea and vomiting. Pancreatitis. Upper abdominal pain.  EXAM: ULTRASOUND ABDOMEN COMPLETE  COMPARISON:  MRI 03/05/2014  FINDINGS: Gallbladder:  Minimal sludge layering within the gallbladder. No cholelithiasis. No wall thickening or focal tenderness.  Common bile duct:  Diameter: 10 mm maximal diameter. A plastic stent is seen within the lumen of the common bile duct the level of the porta hepatis. The stent is not seen below the pancreatic mass, but does appear in good position on preceding radiography. There is intrahepatic biliary ductal dilatation which  is stable or mildly decreased from prior.  Liver:  No focal lesion identified. Within normal limits in parenchymal echogenicity.  IVC:  No abnormality visualized.  Pancreas:  Pancreatic head mass as recently characterize by MRI, measuring nearly 2 cm. There is chronic ductal obstruction.  Spleen:  Size and appearance within normal limits.  Right Kidney:  Length: 11 cm. Echogenicity within normal limits. No mass or hydronephrosis visualized.  Left Kidney:  Length: 11 cm. Echogenicity within normal limits. No mass or hydronephrosis visualized.  Abdominal aorta:  No aneurysm visualized.  Other findings:  None.  IMPRESSION: 1. No acute sonographic abnormality. 2. Stable pancreatic and ductal dilation secondary to a pancreatic head mass. Status post biliary stenting.   Electronically Signed   By: Jorje Guild M.D.   On: 03/18/2014 04:35   Dg Abd 2 Views  03/18/2014   CLINICAL DATA:  Nausea and vomiting after drinking, history of pancreatitis and biliary stent.  EXAM: ABDOMEN - 2 VIEW  COMPARISON:  CT of the abdomen and pelvis March 04, 2014  FINDINGS: The bowel gas pattern is normal. There is no evidence of free air. No radio-opaque calculi or other significant radiographic abnormality is seen.  IMPRESSION: Moderate amount of retained large bowel stool without obstruction.   Electronically Signed   By: Elon Alas   On: 03/18/2014 03:28      IMPRESSION: Pancreatic cancer -adenocarcinoma   Primary site: Pancreas   Staging method: AJCC 7th Edition   Clinical: Stage IIA (T3, N0, M0)    Summary: Stage IIA (T3, N0, M0)  On ultrasound the patient may have portal vein invasion. Patient would be a good candidate for neoadjuvant treatment consisting of radiosensitizing chemotherapy, (Xeloda) and radiation therapy directed at the upper abdominal area. I discussed the treatment course side effects and potential toxicities of radiation therapy in this situation with the patient and her daughter. She appears to  understand and wishes to proceed with planned course of treatment.   PLAN: Simulation and planning later this week with treatments to begin the week of August 3. Anticipate a preoperative dose of 50.4 gray  I spent 60 minutes minutes face to face with the  patient and more than 50% of that time was spent in counseling and/or coordination of care.   ------------------------------------------------  Blair Promise, PhD, MD

## 2014-04-17 ENCOUNTER — Telehealth: Payer: Self-pay | Admitting: *Deleted

## 2014-04-17 NOTE — Telephone Encounter (Signed)
CALLED PATIENT TO ANSWER QUESTION, SPOKE WITH PATIENT.

## 2014-04-18 ENCOUNTER — Ambulatory Visit: Admission: RE | Admit: 2014-04-18 | Payer: Medicaid Other | Source: Ambulatory Visit

## 2014-04-18 ENCOUNTER — Ambulatory Visit: Payer: Medicaid Other | Admitting: Radiation Oncology

## 2014-04-18 ENCOUNTER — Encounter: Payer: Self-pay | Admitting: Oncology

## 2014-04-18 ENCOUNTER — Encounter: Payer: Self-pay | Admitting: *Deleted

## 2014-04-18 ENCOUNTER — Other Ambulatory Visit: Payer: Self-pay

## 2014-04-18 MED ORDER — CAPECITABINE 500 MG PO TABS: ORAL_TABLET | ORAL | Status: DC

## 2014-04-18 NOTE — Progress Notes (Signed)
Sent xeloda prescription to [facility]

## 2014-04-18 NOTE — Progress Notes (Signed)
[place] Psychosocial Distress Screening Clinical Social Work  Clinical Social Work was referred by distress screening protocol.  The patient scored a 7 on the Psychosocial Distress Thermometer which indicates moderate distress. Clinical Social Worker contacted patient at home to assess for distress and other psychosocial needs.  Patient stated she was doing "ok", and her level of distress had decreased after getting more information.  CSW and pt also discussed transportation.  Patient has contact Medicaid and they are mailing her "paperwork" regarding Medicaid transportation.  CSW and pt also discussed SCAT and the application process.  CSW Polo Riley will follow up with patient at her appointment on 8/28 to complete application.  Patient stated her daughter would be providing transportation to her appointment tomorrow.  CSW encouraged pt to call with any questions or concerns.          Clinical Social Worker follow up needed: Yes.    If yes, follow up plan: CSW will meet with patient and discuss SCAT application at next appointment        [place] 04/16/2014  Screening Type Initial Screening  Mark the number that describes how much distress you have been experiencing in the past week 7  Practical problem type Work/school;Transportation  Emotional problem type Adjusting to illness;Boredom;Adjusting to appearance changes  Spiritual/Religous concerns type Relating to God  Information Concerns Type Lack of info about diagnosis;Lack of info about treatment;Lack of info about complementary therapy choices;Lack of info about maintaining fitness  Physical Problem type Pain;Sleep/insomnia;Loss of appetitie;Constipation/diarrhea  Physician notified of physical symptoms Yes    Johnnye Lana, MSW, [place] [phone]

## 2014-04-19 ENCOUNTER — Other Ambulatory Visit (HOSPITAL_BASED_OUTPATIENT_CLINIC_OR_DEPARTMENT_OTHER): Payer: Medicaid Other

## 2014-04-19 ENCOUNTER — Encounter: Payer: Self-pay | Admitting: *Deleted

## 2014-04-19 ENCOUNTER — Ambulatory Visit
Admission: RE | Admit: 2014-04-19 | Discharge: 2014-04-19 | Disposition: A | Discharge status: Home / Self Care | Payer: Medicaid Other | Source: Ambulatory Visit | Attending: Radiation Oncology | Admitting: Radiation Oncology

## 2014-04-19 ENCOUNTER — Other Ambulatory Visit: Payer: Medicaid Other

## 2014-04-19 VITALS — BP 180/83 | HR 51 | Temp 97.6°F | Ht 63.0 in | Wt 130.6 lb

## 2014-04-19 DIAGNOSIS — C250 Malignant neoplasm of head of pancreas: Secondary | ICD-10-CM

## 2014-04-19 DIAGNOSIS — Z510 Encounter for antineoplastic radiation therapy: Secondary | ICD-10-CM | POA: Diagnosis not present

## 2014-04-19 LAB — COMPREHENSIVE METABOLIC PANEL (CC13)
ALK PHOS: 143 U/L (ref 40–150)
ALT: 26 U/L (ref 0–55)
AST: 30 U/L (ref 5–34)
Albumin: 3.6 g/dL (ref 3.5–5.0)
Anion Gap: 6 mEq/L (ref 3–11)
BILIRUBIN TOTAL: 1.04 mg/dL (ref 0.20–1.20)
BUN: 20.2 mg/dL (ref 7.0–26.0)
CO2: 28 meq/L (ref 22–29)
Calcium: 9.7 mg/dL (ref 8.4–10.4)
Chloride: 105 mEq/L (ref 98–109)
Creatinine: 0.8 mg/dL (ref 0.6–1.1)
GLUCOSE: 175 mg/dL — AB (ref 70–140)
Potassium: 3.7 mEq/L (ref 3.5–5.1)
SODIUM: 140 meq/L (ref 136–145)
Total Protein: 9.4 g/dL — ABNORMAL HIGH (ref 6.4–8.3)

## 2014-04-19 LAB — CBC WITH DIFFERENTIAL/PLATELET
BASO%: 0.6 % (ref 0.0–2.0)
Basophils Absolute: 0 10*3/uL (ref 0.0–0.1)
EOS%: 3.9 % (ref 0.0–7.0)
Eosinophils Absolute: 0.2 10*3/uL (ref 0.0–0.5)
HEMATOCRIT: 44.4 % (ref 34.8–46.6)
HGB: 14.2 g/dL (ref 11.6–15.9)
LYMPH%: 50.2 % — ABNORMAL HIGH (ref 14.0–49.7)
MCH: 30.5 pg (ref 25.1–34.0)
MCHC: 32 g/dL (ref 31.5–36.0)
MCV: 95.1 fL (ref 79.5–101.0)
MONO#: 0.4 10*3/uL (ref 0.1–0.9)
MONO%: 7.8 % (ref 0.0–14.0)
NEUT#: 1.8 10*3/uL (ref 1.5–6.5)
NEUT%: 37.5 % — AB (ref 38.4–76.8)
PLATELETS: 201 10*3/uL (ref 145–400)
RBC: 4.67 10*6/uL (ref 3.70–5.45)
RDW: 13.1 % (ref 11.2–14.5)
WBC: 4.7 10*3/uL (ref 3.9–10.3)
lymph#: 2.4 10*3/uL (ref 0.9–3.3)

## 2014-04-19 MED ORDER — SODIUM CHLORIDE 0.9 % IJ SOLN
10.0000 mL | Freq: Once | INTRAMUSCULAR | Status: AC
  Administered 2014-04-19: 10 mL via INTRAVENOUS

## 2014-04-19 NOTE — Progress Notes (Signed)
IV removed intact from patient's right forearm by Aldona Bar, RN.

## 2014-04-19 NOTE — Progress Notes (Signed)
Melinda Conrad here for IV start for CT SIM.  She denies being diabetic and does not take metformin.  She reports that she does not have any allergies.  She does not remember having IV contrast dye before.  Attempted IV in patient's left forearm.  Removed IV intact.  Pressure and bandaid applied.  #22 gauge IV inserted in patient's right forearm by Aldona Bar, RN.  Blood return noted.  Patient escorted to CT SIM by Anderson Malta, RT.

## 2014-04-20 ENCOUNTER — Encounter: Payer: Self-pay | Admitting: Specialist

## 2014-04-20 NOTE — Progress Notes (Signed)
Contacted patient per referral from social work. Patient told me her primary need right now is for transportation. She also wishes to do her Advanced Directive paperwork when she comes back in on August 10th. Will refer to social work for transportation. Offered support. Will continue to follow.  Epifania Gore, PhD, [place]

## 2014-04-20 NOTE — Progress Notes (Signed)
  Radiation Oncology         [phone] ________________________________  Name: Melinda Conrad MRN: [id number]  Date: 04/19/2014  DOB: [birth year]  SIMULATION AND TREATMENT PLANNING NOTE  DIAGNOSIS:  Pancreatic cancer   Primary site: Pancreas   Staging method: AJCC 7th Edition   Clinical: Stage IIA (T3, N0, M0) signed by Ladell Pier, MD on 04/12/2014  5:49 PM   Summary: Stage IIA (T3, N0, M0)   NARRATIVE:  The patient was brought to the [facility].  Identity was confirmed.  All relevant records and images related to the planned course of therapy were reviewed.  The patient freely provided informed written consent to proceed with treatment after reviewing the details related to the planned course of therapy. The consent form was witnessed and verified by the simulation staff.  Then, the patient was set-up in a stable reproducible  supine position for radiation therapy.  CT images were obtained.  Surface markings were placed.  The CT images were loaded into the planning software.  Then the target and avoidance structures were contoured.  Treatment planning then occurred.  The radiation prescription was entered and confirmed.  Then, I designed and supervised the construction of a total of 1 medically necessary complex treatment devices.  I have requested : Intensity Modulated Radiotherapy (IMRT) is medically necessary for this case for the following reason:  Small bowel sparing, liver and kidney sparing.  I have ordered:Nutrition Consult  PLAN:  The patient will receive 45 Gy in 25 fractions with a simultaneous integrated boost to the pancreatic head of 50 gray.  ________________________________   Special treatment procedure note  The patient will be receiving radiosensitizing chemotherapy throughout her upper abdominal radiation treatment. Given the increased potential for toxicities as well as the necessity for close monitoring of the patient and bloodwork, this  constitutes a special treatment procedure -----------------------------------  Blair Promise, PhD, MD

## 2014-04-21 DIAGNOSIS — I10 Essential (primary) hypertension: Secondary | ICD-10-CM | POA: Diagnosis not present

## 2014-04-21 DIAGNOSIS — Z79899 Other long term (current) drug therapy: Secondary | ICD-10-CM | POA: Diagnosis not present

## 2014-04-21 DIAGNOSIS — Z510 Encounter for antineoplastic radiation therapy: Secondary | ICD-10-CM | POA: Diagnosis present

## 2014-04-21 DIAGNOSIS — Z86010 Personal history of colonic polyps: Secondary | ICD-10-CM | POA: Diagnosis not present

## 2014-04-21 DIAGNOSIS — R109 Unspecified abdominal pain: Secondary | ICD-10-CM | POA: Diagnosis not present

## 2014-04-21 DIAGNOSIS — M549 Dorsalgia, unspecified: Secondary | ICD-10-CM | POA: Diagnosis not present

## 2014-04-21 DIAGNOSIS — C250 Malignant neoplasm of head of pancreas: Secondary | ICD-10-CM | POA: Diagnosis not present

## 2014-04-23 ENCOUNTER — Ambulatory Visit (INDEPENDENT_AMBULATORY_CARE_PROVIDER_SITE_OTHER): Payer: Medicaid Other | Admitting: General Surgery

## 2014-04-25 DIAGNOSIS — Z510 Encounter for antineoplastic radiation therapy: Secondary | ICD-10-CM | POA: Diagnosis not present

## 2014-04-27 ENCOUNTER — Ambulatory Visit: Payer: Medicaid Other | Admitting: Radiation Oncology

## 2014-04-27 DIAGNOSIS — Z510 Encounter for antineoplastic radiation therapy: Secondary | ICD-10-CM | POA: Diagnosis not present

## 2014-04-30 ENCOUNTER — Ambulatory Visit
Admission: RE | Admit: 2014-04-30 | Discharge: 2014-04-30 | Disposition: A | Discharge status: Home / Self Care | Payer: Medicaid Other | Source: Ambulatory Visit | Attending: Radiation Oncology | Admitting: Radiation Oncology

## 2014-04-30 DIAGNOSIS — Z510 Encounter for antineoplastic radiation therapy: Secondary | ICD-10-CM | POA: Diagnosis not present

## 2014-05-01 ENCOUNTER — Encounter: Payer: Self-pay | Admitting: Radiation Oncology

## 2014-05-01 ENCOUNTER — Ambulatory Visit
Admission: RE | Admit: 2014-05-01 | Discharge: 2014-05-01 | Disposition: A | Discharge status: Home / Self Care | Payer: Medicaid Other | Source: Ambulatory Visit | Attending: Radiation Oncology | Admitting: Radiation Oncology

## 2014-05-01 VITALS — BP 135/82 | HR 69 | Temp 98.8°F | Resp 16 | Wt 131.2 lb

## 2014-05-01 DIAGNOSIS — Z510 Encounter for antineoplastic radiation therapy: Secondary | ICD-10-CM | POA: Diagnosis not present

## 2014-05-01 DIAGNOSIS — C250 Malignant neoplasm of head of pancreas: Secondary | ICD-10-CM

## 2014-05-01 MED ORDER — OXYCODONE HCL 5 MG PO TABS: 5.0000 mg | ORAL_TABLET | ORAL | Status: DC | PRN

## 2014-05-01 MED ORDER — RADIAPLEXRX EX GEL
Freq: Once | CUTANEOUS | Status: AC
  Administered 2014-05-01: 10:00:00 via TOPICAL

## 2014-05-01 NOTE — Progress Notes (Addendum)
Weekly rad txs, 2/25 pancreas, patient education done, radiation therapy and you book given, Santiago Glad Hess,RN business card and radiaplex gel , with instructions of using cream daily after rad txs, pat stated smild discomfort in abdomen, had a lot of pain last night, needs refill on OXY IR 5mg  stated, no c/o nausea, also discusses  Nausea,diarrhea, fatigue,skin irritation, increase protein in diet, drinks ensure daily also between meals, stay hydrated with fluids, takes mira lax prn for constipation, sees MD and staff RN weekly and prn, mild fatigue, takes Xeloda BID 10:02 AM

## 2014-05-01 NOTE — Progress Notes (Signed)
  Radiation Oncology         [phone] ________________________________  Name: Melinda Conrad MRN: [id number]  Date: 05/01/2014  DOB: 1951/02/03  Weekly Radiation Therapy Management  Pancreatic cancer   Primary site: Pancreas   Staging method: AJCC 7th Edition   Clinical: Stage IIA (T3, N0, M0)    Summary: Stage IIA (T3, N0, M0)   Current Dose: 4 Gy     Planned Dose:  50 Gy  Narrative . . . . . . . . The patient presents for routine under treatment assessment.                                   The patient is without complaint except for pain in the upper abdominal area. I did refill the patient's oxycodone today. The patient is taking Xeloda as a radiation sensitizer                                 Set-up films were reviewed.                                 The chart was checked. Physical Findings. . .  weight is 131 lb 3.2 oz (59.512 kg). Her oral temperature is 98.8 F (37.1 C). Her blood pressure is 135/82 and her pulse is 69. Her respiration is 16. . The lungs are clear. The heart has a regular rhythm and rate. The abdomen is soft and nontender with normal bowel sounds. Impression . . . . . . . The patient is tolerating radiation. Plan . . . . . . . . . . . . Continue treatment as planned.  ________________________________   Blair Promise, PhD, MD

## 2014-05-02 ENCOUNTER — Encounter: Payer: Self-pay | Admitting: *Deleted

## 2014-05-02 ENCOUNTER — Ambulatory Visit
Admission: RE | Admit: 2014-05-02 | Discharge: 2014-05-02 | Disposition: A | Discharge status: Home / Self Care | Payer: Medicaid Other | Source: Ambulatory Visit | Attending: Radiation Oncology | Admitting: Radiation Oncology

## 2014-05-02 DIAGNOSIS — Z510 Encounter for antineoplastic radiation therapy: Secondary | ICD-10-CM | POA: Diagnosis not present

## 2014-05-02 NOTE — Progress Notes (Signed)
[place] Work  Holiday representative met with patient and patients daughter in office at [facility] to discuss transportation needs.  CSW and patient reviewed and completed SCAT application.  CSW faxed completed application.  SCAT will contact patient if approved.    Johnnye Lana, MSW, LCSW, OSW-C Clinical Social Worker [facility] [phone]

## 2014-05-03 ENCOUNTER — Ambulatory Visit: Payer: Medicaid Other

## 2014-05-03 ENCOUNTER — Encounter (HOSPITAL_COMMUNITY): Payer: Self-pay | Admitting: Emergency Medicine

## 2014-05-03 ENCOUNTER — Telehealth: Payer: Self-pay | Admitting: *Deleted

## 2014-05-03 ENCOUNTER — Emergency Department (HOSPITAL_COMMUNITY)
Admission: EM | Admit: 2014-05-03 | Discharge: 2014-05-03 | Disposition: A | Discharge status: Home / Self Care | Payer: Medicaid Other | Attending: Emergency Medicine | Admitting: Emergency Medicine

## 2014-05-03 DIAGNOSIS — Z8739 Personal history of other diseases of the musculoskeletal system and connective tissue: Secondary | ICD-10-CM | POA: Diagnosis not present

## 2014-05-03 DIAGNOSIS — Z791 Long term (current) use of non-steroidal anti-inflammatories (NSAID): Secondary | ICD-10-CM | POA: Diagnosis not present

## 2014-05-03 DIAGNOSIS — F17200 Nicotine dependence, unspecified, uncomplicated: Secondary | ICD-10-CM | POA: Diagnosis not present

## 2014-05-03 DIAGNOSIS — Z8507 Personal history of malignant neoplasm of pancreas: Secondary | ICD-10-CM

## 2014-05-03 DIAGNOSIS — Z9889 Other specified postprocedural states: Secondary | ICD-10-CM | POA: Insufficient documentation

## 2014-05-03 DIAGNOSIS — Z79899 Other long term (current) drug therapy: Secondary | ICD-10-CM | POA: Diagnosis not present

## 2014-05-03 DIAGNOSIS — K5900 Constipation, unspecified: Secondary | ICD-10-CM | POA: Diagnosis not present

## 2014-05-03 DIAGNOSIS — R1013 Epigastric pain: Secondary | ICD-10-CM | POA: Insufficient documentation

## 2014-05-03 DIAGNOSIS — Z860100 Personal history of colon polyps, unspecified: Secondary | ICD-10-CM | POA: Insufficient documentation

## 2014-05-03 DIAGNOSIS — F3289 Other specified depressive episodes: Secondary | ICD-10-CM | POA: Insufficient documentation

## 2014-05-03 DIAGNOSIS — I10 Essential (primary) hypertension: Secondary | ICD-10-CM | POA: Insufficient documentation

## 2014-05-03 DIAGNOSIS — F329 Major depressive disorder, single episode, unspecified: Secondary | ICD-10-CM | POA: Diagnosis not present

## 2014-05-03 DIAGNOSIS — M129 Arthropathy, unspecified: Secondary | ICD-10-CM | POA: Diagnosis not present

## 2014-05-03 LAB — CBC WITH DIFFERENTIAL/PLATELET
BASOS ABS: 0 10*3/uL (ref 0.0–0.1)
Basophils Relative: 0 % (ref 0–1)
Eosinophils Absolute: 0.1 10*3/uL (ref 0.0–0.7)
Eosinophils Relative: 2 % (ref 0–5)
HCT: 39.3 % (ref 36.0–46.0)
Hemoglobin: 13.1 g/dL (ref 12.0–15.0)
Lymphocytes Relative: 31 % (ref 12–46)
Lymphs Abs: 1.2 10*3/uL (ref 0.7–4.0)
MCH: 30.3 pg (ref 26.0–34.0)
MCHC: 33.3 g/dL (ref 30.0–36.0)
MCV: 90.8 fL (ref 78.0–100.0)
Monocytes Absolute: 0.4 10*3/uL (ref 0.1–1.0)
Monocytes Relative: 9 % (ref 3–12)
Neutro Abs: 2.3 10*3/uL (ref 1.7–7.7)
Neutrophils Relative %: 58 % (ref 43–77)
PLATELETS: 161 10*3/uL (ref 150–400)
RBC: 4.33 MIL/uL (ref 3.87–5.11)
RDW: 12.3 % (ref 11.5–15.5)
WBC: 3.9 10*3/uL — AB (ref 4.0–10.5)

## 2014-05-03 LAB — COMPREHENSIVE METABOLIC PANEL
ALT: 28 U/L (ref 0–35)
AST: 34 U/L (ref 0–37)
Albumin: 3 g/dL — ABNORMAL LOW (ref 3.5–5.2)
Alkaline Phosphatase: 90 U/L (ref 39–117)
Anion gap: 13 (ref 5–15)
BILIRUBIN TOTAL: 0.5 mg/dL (ref 0.3–1.2)
BUN: 16 mg/dL (ref 6–23)
CHLORIDE: 100 meq/L (ref 96–112)
CO2: 20 mEq/L (ref 19–32)
CREATININE: 0.63 mg/dL (ref 0.50–1.10)
Calcium: 8.6 mg/dL (ref 8.4–10.5)
GFR calc Af Amer: >90 mL/min (ref >90)
GFR calc non Af Amer: >90 mL/min (ref >90)
Glucose, Bld: 120 mg/dL — ABNORMAL HIGH (ref 70–99)
Potassium: 3.8 mEq/L (ref 3.7–5.3)
SODIUM: 133 meq/L — AB (ref 137–147)
Total Protein: 8.1 g/dL (ref 6.0–8.3)

## 2014-05-03 LAB — I-STAT CG4 LACTIC ACID, ED: Lactic Acid, Venous: 1.83 mmol/L (ref 0.5–2.2)

## 2014-05-03 MED ORDER — SODIUM CHLORIDE 0.9 % IV BOLUS (SEPSIS)
1000.0000 mL | Freq: Once | INTRAVENOUS | Status: AC
  Administered 2014-05-03: 1000 mL via INTRAVENOUS

## 2014-05-03 MED ORDER — OXYCODONE HCL ER 10 MG PO T12A
10.0000 mg | EXTENDED_RELEASE_TABLET | Freq: Two times a day (BID) | ORAL | Status: DC
  Filled 2014-05-03: qty 1

## 2014-05-03 NOTE — Telephone Encounter (Signed)
Patient called  Spoke with Virgel Manifold said she took some medication not feeling good,  but it made her worse feeling, she is  cancelling todays treatment she is in the ED now, will inform Dr.Kinard's nurse, Dr.Kinard and Linac 1 8:07 AM

## 2014-05-03 NOTE — ED Notes (Signed)
Pt states at discharge had brown bag of medications with her upon arrival to ED. Brought by EMS. Several employees [employer] NT, Charge Tammy C Scientist, research (life sciences) for medications without success. There was no documentation for medications received or given to Pharmacy.  Pt indicated she would talk to her doctor about medications and EMS. Per Tammy's conversation pt was happy with our efforts and that we would continue to look for medications. Pt did have blue duffle bag with her. This Probation officer did request pt search this duffle bag for medications. Pt states "she just knew they were not in there". Pt did not search bag. Pt also asked for pain medication before discharge. This Probation officer did attempt to give medication however was unable to give due to schedule medication. It would have been given to early. This was communicated to the pt by this Probation officer and  Charge Tammy C. RN. Pt verbally understood and agreed. Pt discharged without complaint.

## 2014-05-03 NOTE — ED Notes (Signed)
PER CHARGE TAMMY CAMPBELL RN- BROWN BAG OF MEDICATION FOUND. PT CONTACTED. ARRANGEMENTS MADE FOR PICK UP.

## 2014-05-03 NOTE — ED Notes (Signed)
Pt states does not see Dr. Marlou Sa however see [facility]. [facility]

## 2014-05-03 NOTE — ED Provider Notes (Signed)
CSN: [id number]     Arrival date & time 05/03/14  0223 History   First MD Initiated Contact with Patient 05/03/14 0239     Chief Complaint  Patient presents with  . Abdominal Pain  . Hypotension     (Consider location/radiation/quality/duration/timing/severity/associated sxs/prior Treatment) HPI Comments: Pt with hx of pancreatic CA, comes in with cc of epigastric pain. Pt reports that she was sleeping, but suddenly had severe epigastric abd pain. The pain is sharp, and non radiating. There was no associated nausea, emesis. PT reports being constipated. No BM x 1 week - however, she is passing flatus.  EMS reported that patient's BP was 70/50 when they checked her BP. Pt was aox3, and saline bolus was started. Pt arrives with a BP of 127 SBP.  Patient is a 63 y.o. female presenting with abdominal pain. The history is provided by the patient.  Abdominal Pain Associated symptoms: constipation   Associated symptoms: no chest pain, no dysuria, no nausea, no shortness of breath and no vomiting     Past Medical History  Diagnosis Date  . Arthritis   . Depression   . H/O: substance abuse   . Back pain   . Personal history of colonic polyp - adenoma 10/26/2013    10/26/2013 diminutive sigmoid polyp removed    . Pancreatitis, acute   . Hypertension   . Osteoporosis    Past Surgical History  Procedure Laterality Date  . Lumbar epidural injection    . Colonoscopy    . Ercp N/A 03/06/2014    Procedure: ENDOSCOPIC RETROGRADE CHOLANGIOPANCREATOGRAPHY (ERCP);  Surgeon: Gatha Mayer, MD;  Location: [facility] ENDOSCOPY;  Service: Endoscopy;  Laterality: N/A;  . Eus N/A 04/05/2014    Procedure: UPPER ENDOSCOPIC ULTRASOUND (EUS) RADIAL;  Surgeon: [name], MD;  Location: [facility] ENDOSCOPY;  Service: Endoscopy;  Laterality: N/A;   Family History  Problem Relation Age of Onset  . Colon cancer Neg Hx   . Esophageal cancer Neg Hx   . Rectal cancer Neg Hx   . Stomach cancer Neg Hx   . CAD Mother   .  Cancer Father   . Cirrhosis Sister     she is a heavy drinker.    History  Substance Use Topics  . Smoking status: Current Every Day Smoker -- 0.25 packs/day for 40 years    Types: Cigarettes  . Smokeless tobacco: Current User  . Alcohol Use: No     Comment: occasionally. Quit  , none in [place] drank before  to help with pain before ERD visit"   OB History   Grav Para Term Preterm Abortions TAB SAB Ect Mult Living                 Review of Systems  Constitutional: Positive for activity change.  Respiratory: Negative for shortness of breath.   Cardiovascular: Negative for chest pain.  Gastrointestinal: Positive for abdominal pain and constipation. Negative for nausea and vomiting.  Genitourinary: Negative for dysuria.  Musculoskeletal: Negative for neck pain.  Neurological: Negative for headaches.      Allergies  Review of patient's allergies indicates no known allergies.  Home Medications   Prior to Admission medications   Medication Sig Start Date End Date Taking? Authorizing Provider  amLODipine (NORVASC) 5 MG tablet Take 5 mg by mouth daily.   Yes Historical Provider, MD  CALCIUM-VITAMIN D PO Take 1 capsule by mouth daily.   Yes Historical Provider, MD  capecitabine (XELODA) 500 MG tablet Hand  written rx, sent to managed care  Take 3 tablets (1500 mg) in the AM, Take 2 tablets (1000 mg) in the PM, Total dose 2500 mg daily. Take on Radiation days only 04/18/14  Yes Ladell Pier, MD  hyaluronate sodium (RADIAPLEXRX) GEL Apply 1 application topically 2 (two) times daily. 05/01/14  Yes Blair Promise, MD  ibuprofen (ADVIL,MOTRIN) 800 MG tablet Take 800 mg by mouth 2 (two) times daily as needed for mild pain or moderate pain.   Yes Historical Provider, MD  metoCLOPramide (REGLAN) 10 MG tablet Take 1 tablet (10 mg total) by mouth every 6 (six) hours. 03/18/14  Yes Mariea Clonts, MD  omeprazole (PRILOSEC) 20 MG capsule Take 40 mg by mouth daily.   Yes Historical  Provider, MD  ondansetron (ZOFRAN) 4 MG tablet Take 1 tablet (4 mg total) by mouth every 6 (six) hours as needed for nausea. 04/09/14  Yes Gatha Mayer, MD  oxyCODONE (OXY IR/ROXICODONE) 5 MG immediate release tablet Take 1 tablet (5 mg total) by mouth every 4 (four) hours as needed for severe pain. 05/01/14  Yes Blair Promise, MD  polyethylene glycol [facility] / GLYCOLAX) packet Take 17 g by mouth daily.   Yes Historical Provider, MD  sertraline (ZOLOFT) 100 MG tablet Take 100 mg by mouth every morning.    Yes Historical Provider, MD   BP 148/86  Pulse 58  Temp(Src) 97.9 F (36.6 C) (Oral)  Resp 19  SpO2 99% Physical Exam  Nursing note and vitals reviewed. Constitutional: She is oriented to person, place, and time. She appears well-developed and well-nourished.  HENT:  Head: Normocephalic and atraumatic.  Eyes: EOM are normal. Pupils are equal, round, and reactive to light.  Neck: Neck supple.  Cardiovascular: Normal rate, regular rhythm and normal heart sounds.   No murmur heard. Pulmonary/Chest: Effort normal. No respiratory distress.  Abdominal: Soft. She exhibits mass. She exhibits no distension. There is tenderness. There is no rebound and no guarding.  Pt has palpable epigastric mass, with tenderness in the epigastrium only. The abd is soft, hypoactive Bowel sounds.  Neurological: She is alert and oriented to person, place, and time.  Skin: Skin is warm and dry.    ED Course  Procedures (including critical care time) Labs Review Labs Reviewed  CBC WITH DIFFERENTIAL - Abnormal; Notable for the following:    WBC 3.9 (*)    All other components within normal limits  COMPREHENSIVE METABOLIC PANEL - Abnormal; Notable for the following:    Sodium 133 (*)    Glucose, Bld 120 (*)    Albumin 3.0 (*)    All other components within normal limits  I-STAT CG4 LACTIC ACID, ED    Imaging Review No results found.   EKG Interpretation None      MDM   Final diagnoses:   Epigastric pain  H/O pancreatic cancer    DDx includes: Pancreatitis Hepatobiliary pathology including cholecystitis Gastritis/PUD SBO ACS syndrome  Pt comes in with cc of abd pain. Hx of pancreatic CA, not on any chemo/radiation currently. Pt is a lot comfortable during my evaluation. States pain is now moderate. Pt was hypotensive per EMS, but is normotensive currently. Pt has no peritoneal signs on my exam. She is passing flatus - it appears that she has constipation due to her opioid use, and i have asked her to use stool softener.  Pt observed overnight in the ER. Her labs are normal, including her lactic acid, pain remained mild-moderate, and  she never needed any serious pain meds, and BP remained normal as well.  Pt has an appt with oncology team now, will d.c.   Varney Biles, MD 05/03/14 [phone]

## 2014-05-03 NOTE — Discharge Instructions (Signed)
We saw you in the ER for the low BP and abdominal pain. All the results in the ER are normal. We are not sure what is causing your symptoms. The workup in the ER is not complete, and is limited to screening for life threatening and emergent conditions only, so please see a primary care doctor for further evaluation.  Please return to the ER if your symptoms worsen; you have increased pain, fevers, chills, inability to keep any medications down, confusion. Otherwise see the outpatient doctor as requested.   Abdominal Pain, Women Abdominal (stomach, pelvic, or belly) pain can be caused by many things. It is important to tell your doctor:  The location of the pain.  Does it come and go or is it present all the time?  Are there things that start the pain (eating certain foods, exercise)?  Are there other symptoms associated with the pain (fever, nausea, vomiting, diarrhea)? All of this is helpful to know when trying to find the cause of the pain. CAUSES   Stomach: virus or bacteria infection, or ulcer.  Intestine: appendicitis (inflamed appendix), regional ileitis (Crohn's disease), ulcerative colitis (inflamed colon), irritable bowel syndrome, diverticulitis (inflamed diverticulum of the colon), or cancer of the stomach or intestine.  Gallbladder disease or stones in the gallbladder.  Kidney disease, kidney stones, or infection.  Pancreas infection or cancer.  Fibromyalgia (pain disorder).  Diseases of the female organs:  Uterus: fibroid (non-cancerous) tumors or infection.  Fallopian tubes: infection or tubal pregnancy.  Ovary: cysts or tumors.  Pelvic adhesions (scar tissue).  Endometriosis (uterus lining tissue growing in the pelvis and on the pelvic organs).  Pelvic congestion syndrome (female organs filling up with blood just before the menstrual period).  Pain with the menstrual period.  Pain with ovulation (producing an egg).  Pain with an IUD (intrauterine  device, birth control) in the uterus.  Cancer of the female organs.  Functional pain (pain not caused by a disease, may improve without treatment).  Psychological pain.  Depression. DIAGNOSIS  Your doctor will decide the seriousness of your pain by doing an examination.  Blood tests.  X-rays.  Ultrasound.  CT scan (computed tomography, special type of X-ray).  MRI (magnetic resonance imaging).  Cultures, for infection.  Barium enema (dye inserted in the large intestine, to better view it with X-rays).  Colonoscopy (looking in intestine with a lighted tube).  Laparoscopy (minor surgery, looking in abdomen with a lighted tube).  Major abdominal exploratory surgery (looking in abdomen with a large incision). TREATMENT  The treatment will depend on the cause of the pain.   Many cases can be observed and treated at home.  Over-the-counter medicines recommended by your caregiver.  Prescription medicine.  Antibiotics, for infection.  Birth control pills, for painful periods or for ovulation pain.  Hormone treatment, for endometriosis.  Nerve blocking injections.  Physical therapy.  Antidepressants.  Counseling with a psychologist or psychiatrist.  Minor or major surgery. HOME CARE INSTRUCTIONS   Do not take laxatives, unless directed by your caregiver.  Take over-the-counter pain medicine only if ordered by your caregiver. Do not take aspirin because it can cause an upset stomach or bleeding.  Try a clear liquid diet (broth or water) as ordered by your caregiver. Slowly move to a bland diet, as tolerated, if the pain is related to the stomach or intestine.  Have a thermometer and take your temperature several times a day, and record it.  Bed rest and sleep, if it helps  the pain.  Avoid sexual intercourse, if it causes pain.  Avoid stressful situations.  Keep your follow-up appointments and tests, as your caregiver orders.  If the pain does not go away  with medicine or surgery, you may try:  Acupuncture.  Relaxation exercises (yoga, meditation).  Group therapy.  Counseling. SEEK MEDICAL CARE IF:   You notice certain foods cause stomach pain.  Your home care treatment is not helping your pain.  You need stronger pain medicine.  You want your IUD removed.  You feel faint or lightheaded.  You develop nausea and vomiting.  You develop a rash.  You are having side effects or an allergy to your medicine. SEEK IMMEDIATE MEDICAL CARE IF:   Your pain does not go away or gets worse.  You have a fever.  Your pain is felt only in portions of the abdomen. The right side could possibly be appendicitis. The left lower portion of the abdomen could be colitis or diverticulitis.  You are passing blood in your stools (bright red or black tarry stools, with or without vomiting).  You have blood in your urine.  You develop chills, with or without a fever.  You pass out. MAKE SURE YOU:   Understand these instructions.  Will watch your condition.  Will get help right away if you are not doing well or get worse. Document Released: 07/05/2007 Document Revised: 01/22/2014 Document Reviewed: 07/25/2009 [facility] Patient Information 2015 [place], [place]. This information is not intended to replace advice given to you by your health care provider. Make sure you discuss any questions you have with your health care provider.

## 2014-05-03 NOTE — ED Notes (Signed)
Brought in by EMS from home with c/o abdominal pain.  Per PTAR, pt reported that she woke up with severe epigastric pain without nausea or vomiting or diarrhea.  Pt has "pancreatic cancer", pt reported that she took oxycodone prior to going to sleep but did not take another one with this sudden pain.  Pt also c/o "constipation"--- reports that she has had her last BM a week ago.  Pt's BP was 70/50 by EMS--- pt was given NS 500 ml en route to ED.

## 2014-05-03 NOTE — Telephone Encounter (Signed)
Notified Lin[place] 1

## 2014-05-04 ENCOUNTER — Ambulatory Visit
Admission: RE | Admit: 2014-05-04 | Discharge: 2014-05-04 | Disposition: A | Discharge status: Home / Self Care | Payer: Medicaid Other | Source: Ambulatory Visit | Attending: Radiation Oncology | Admitting: Radiation Oncology

## 2014-05-04 ENCOUNTER — Encounter: Payer: Self-pay | Admitting: *Deleted

## 2014-05-04 DIAGNOSIS — Z510 Encounter for antineoplastic radiation therapy: Secondary | ICD-10-CM | POA: Diagnosis not present

## 2014-05-04 NOTE — Progress Notes (Signed)
[facility] Social Work  Clinical Social Work was referred by pt and daughter  to review and complete healthcare advance directives.  Clinical Social Worker met with patient and daughter, Joseph Art in [place] office.  The patient designated her daughter, Loran Senters as their primary healthcare agent and Leighton Parody as their secondary agent.  Patient also completed healthcare living will.    Clinical Social Worker notarized documents and made copies for patient/family. Clinical Social Worker will send documents to medical records to be scanned into patient's chart. Clinical Social Worker encouraged patient/family to contact with any additional questions or concerns.  Loren Racer, LCSW Clinical Social Worker [facility] [place] for [place] Wednesday, Thursday and Friday Phone: [phone] Fax: [phone]

## 2014-05-07 ENCOUNTER — Ambulatory Visit
Admission: RE | Admit: 2014-05-07 | Discharge: 2014-05-07 | Disposition: A | Discharge status: Home / Self Care | Payer: Medicaid Other | Source: Ambulatory Visit | Attending: Radiation Oncology | Admitting: Radiation Oncology

## 2014-05-07 DIAGNOSIS — Z510 Encounter for antineoplastic radiation therapy: Secondary | ICD-10-CM | POA: Diagnosis not present

## 2014-05-08 ENCOUNTER — Ambulatory Visit
Admission: RE | Admit: 2014-05-08 | Discharge: 2014-05-08 | Disposition: A | Discharge status: Home / Self Care | Payer: Medicaid Other | Source: Ambulatory Visit | Attending: Radiation Oncology | Admitting: Radiation Oncology

## 2014-05-08 ENCOUNTER — Encounter: Payer: Self-pay | Admitting: Radiation Oncology

## 2014-05-08 VITALS — BP 185/83 | HR 54 | Temp 98.6°F | Resp 16 | Ht 63.0 in | Wt 133.4 lb

## 2014-05-08 DIAGNOSIS — Z510 Encounter for antineoplastic radiation therapy: Secondary | ICD-10-CM | POA: Diagnosis not present

## 2014-05-08 DIAGNOSIS — C250 Malignant neoplasm of head of pancreas: Secondary | ICD-10-CM

## 2014-05-08 NOTE — Progress Notes (Addendum)
Melinda Conrad has compelted 6 fractions to her pancreas.  She denies pain now.  She is taking oxycodone 2 tablets three times a day.  She reports constipation and is taking miralax without results.  Her last bowel movement was two days ago.  She denies nausea.  She is taking xeloda.  Her weight is up 2 lbs this week.  Her skin is intact and she is using radiaplex gel on her abdomen.  Her bp is elevated at 185/83.

## 2014-05-08 NOTE — Progress Notes (Signed)
  Radiation Oncology         [phone] ________________________________  Name: Melinda Conrad MRN: [id number]  Date: 05/08/2014  DOB: [birth year]  Weekly Radiation Therapy Management  Current Dose: 12 Gy     Planned Dose:  50 Gy  Narrative . . . . . . . . The patient presents for routine under treatment assessment.                                   The patient is without complaint except for problems with constipation. Patient will try taking MiraLax twice daily to alleviate this issue.  She denies any nausea. She continues to take radiosensitizing chemotherapy.                                 Set-up films were reviewed.                                 The chart was checked. Physical Findings. . .  height is 5\' 3"  (1.6 m) and weight is 133 lb 6.4 oz (60.51 kg). Her oral temperature is 98.6 F (37 C). Her blood pressure is 185/83 and her pulse is 54. Her respiration is 16. . The lungs are clear. The heart has a regular rhythm and rate. The abdomen is soft and nontender with normal bowel sounds. Impression . . . . . . . The patient is tolerating radiation. Plan . . . . . . . . . . . . Continue treatment as planned.  ________________________________   Blair Promise, PhD, MD

## 2014-05-09 ENCOUNTER — Ambulatory Visit
Admission: RE | Admit: 2014-05-09 | Discharge: 2014-05-09 | Disposition: A | Discharge status: Home / Self Care | Payer: Medicaid Other | Source: Ambulatory Visit | Attending: Radiation Oncology | Admitting: Radiation Oncology

## 2014-05-09 DIAGNOSIS — Z510 Encounter for antineoplastic radiation therapy: Secondary | ICD-10-CM | POA: Diagnosis not present

## 2014-05-10 ENCOUNTER — Ambulatory Visit
Admission: RE | Admit: 2014-05-10 | Discharge: 2014-05-10 | Disposition: A | Discharge status: Home / Self Care | Payer: Medicaid Other | Source: Ambulatory Visit | Attending: Radiation Oncology | Admitting: Radiation Oncology

## 2014-05-10 DIAGNOSIS — Z510 Encounter for antineoplastic radiation therapy: Secondary | ICD-10-CM | POA: Diagnosis not present

## 2014-05-11 ENCOUNTER — Telehealth: Payer: Self-pay | Admitting: Oncology

## 2014-05-11 ENCOUNTER — Encounter: Payer: Self-pay | Admitting: Nutrition

## 2014-05-11 ENCOUNTER — Other Ambulatory Visit (HOSPITAL_BASED_OUTPATIENT_CLINIC_OR_DEPARTMENT_OTHER): Payer: Medicaid Other

## 2014-05-11 ENCOUNTER — Other Ambulatory Visit: Payer: Self-pay | Admitting: *Deleted

## 2014-05-11 ENCOUNTER — Ambulatory Visit
Admission: RE | Admit: 2014-05-11 | Discharge: 2014-05-11 | Disposition: A | Discharge status: Home / Self Care | Payer: Medicaid Other | Source: Ambulatory Visit | Attending: Radiation Oncology | Admitting: Radiation Oncology

## 2014-05-11 ENCOUNTER — Ambulatory Visit (HOSPITAL_BASED_OUTPATIENT_CLINIC_OR_DEPARTMENT_OTHER): Payer: Medicaid Other | Admitting: Oncology

## 2014-05-11 ENCOUNTER — Ambulatory Visit: Payer: Medicaid Other | Admitting: Nutrition

## 2014-05-11 VITALS — BP 166/72 | HR 53 | Temp 98.2°F | Resp 17 | Ht 63.0 in | Wt 134.9 lb

## 2014-05-11 DIAGNOSIS — C259 Malignant neoplasm of pancreas, unspecified: Secondary | ICD-10-CM

## 2014-05-11 DIAGNOSIS — C250 Malignant neoplasm of head of pancreas: Secondary | ICD-10-CM

## 2014-05-11 DIAGNOSIS — G893 Neoplasm related pain (acute) (chronic): Secondary | ICD-10-CM

## 2014-05-11 DIAGNOSIS — Z87891 Personal history of nicotine dependence: Secondary | ICD-10-CM

## 2014-05-11 DIAGNOSIS — Z510 Encounter for antineoplastic radiation therapy: Secondary | ICD-10-CM | POA: Diagnosis not present

## 2014-05-11 LAB — CBC WITH DIFFERENTIAL/PLATELET
BASO%: 0.4 % (ref 0.0–2.0)
BASOS ABS: 0 10*3/uL (ref 0.0–0.1)
EOS%: 4.3 % (ref 0.0–7.0)
Eosinophils Absolute: 0.1 10*3/uL (ref 0.0–0.5)
HEMATOCRIT: 41.1 % (ref 34.8–46.6)
HEMOGLOBIN: 13.7 g/dL (ref 11.6–15.9)
LYMPH#: 0.8 10*3/uL — AB (ref 0.9–3.3)
LYMPH%: 27.4 % (ref 14.0–49.7)
MCH: 30.9 pg (ref 25.1–34.0)
MCHC: 33.3 g/dL (ref 31.5–36.0)
MCV: 92.6 fL (ref 79.5–101.0)
MONO#: 0.4 10*3/uL (ref 0.1–0.9)
MONO%: 13.9 % (ref 0.0–14.0)
NEUT#: 1.5 10*3/uL (ref 1.5–6.5)
NEUT%: 54 % (ref 38.4–76.8)
Platelets: 179 10*3/uL (ref 145–400)
RBC: 4.44 10*6/uL (ref 3.70–5.45)
RDW: 12.7 % (ref 11.2–14.5)
WBC: 2.8 10*3/uL — AB (ref 3.9–10.3)

## 2014-05-11 NOTE — Progress Notes (Signed)
Patient did not show up for nutrition appointment. 

## 2014-05-11 NOTE — Progress Notes (Signed)
63 year old female diagnosed with cancer of the pancreas.  She is a patient of Dr. Benay Spice.  Past medical history includes arthritis, depression, substance abuse, acute pancreatitis, hypertension, and osteoporosis.  Medications include Reglan, Prilosec, Zofran, MiraLax, and Zoloft.  Labs were reviewed.  Height: 63 inches. Weight: 134.9 pounds  On August 21. Usual body weight: 149 pounds January 2015. BMI: 23.9.  Patient arrived late to nutrition appointment.  Patient's only complaint today is constipation.  She has been taking MiraLax, however, it has not been effective.  M.D. has added sorbitol.  She reports improved appetite and consuming ensure once or twice a day.  Weight has increased slightly however, the patient is still below Usual body weight.  Nutrition diagnosis: Unintended weight loss related to diagnosis of pancreas cancer and associated treatments as evidenced by 9% weight loss over 8 months.  Intervention: Educated patient on the importance of small, frequent meals with high-calorie, high-protein foods.  Provided fact sheets for patient. Recommended patient continue oral nutrition supplements twice a day. Provided samples, and coupons. Educated patient on strategies for improving constipation, and provided fact sheets. Teach back method used.  Questions were answered.  Contact information given.  Monitoring, evaluation, goals: Patient will tolerate adequate calories and protein to minimize further weight loss.  Next visit: To be scheduled.    **Disclaimer: This note was dictated with voice recognition software. Similar sounding words can inadvertently be transcribed and this note may contain transcription errors which may not have been corrected upon publication of note.**

## 2014-05-11 NOTE — Progress Notes (Signed)
  [facility] OFFICE PROGRESS NOTE   Diagnosis: Pancreas cancer  INTERVAL HISTORY:   She began concurrent radiation and capecitabine on 04/30/2014. She denies mouth sores, hand/foot pain, and diarrhea. She continues to have abdominal and back pain relieved with oxycodone. Her chief complaint is constipation. She has a bowel movement approximately once weekly despite taking MiraLAX twice daily and a stool softener.  Objective:  Vital signs in last 24 hours:  Blood pressure 166/72, pulse 53, temperature 98.2 F (36.8 C), temperature source Oral, resp. rate 17, height 5\' 3"  (1.6 m), weight 134 lb 14.4 oz (61.19 kg), SpO2 100.00%.    HEENT: No thrush or ulcer Resp: Lungs clear bilaterally Cardio: Regular rate and rhythm GI: Tender in the mid and right upper abdomen. Fullness in the mid upper abdomen. Vascular: No leg edema  Skin: Mild hyperpigmentation of the hands and feet. No erythema or skin breakdown.    Lab Results:  Lab Results  Component Value Date   WBC 2.8* 05/11/2014   HGB 13.7 05/11/2014   HCT 41.1 05/11/2014   MCV 92.6 05/11/2014   PLT 179 05/11/2014   NEUTROABS 1.5 05/11/2014     Medications: I have reviewed the patient's current medications.  Assessment/Plan: 1. 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 Pain secondary to #1 3. constipation-likely secondary to narcotic analgesics 4. anorexia/weight loss  5. tobacco and alcohol use  6. history of a colon polyp in February 2015  7. depression     Disposition:  She appears to be  tolerating the chemotherapy and radiation well. The constipation is likely secondary to narcotics. She will continue MiraLAX and we will add sorbitol. We will check a CBC when she returns next week. Ms. Laughery will return for an office and lab visit 05/24/2014.  Betsy Coder, MD  05/11/2014  9:59 AM

## 2014-05-11 NOTE — Telephone Encounter (Signed)
no vm on either phone #.....mailed pt appt sched and avs for pt for Aug adn Sept

## 2014-05-14 ENCOUNTER — Ambulatory Visit
Admission: RE | Admit: 2014-05-14 | Discharge: 2014-05-14 | Disposition: A | Discharge status: Home / Self Care | Payer: Medicaid Other | Source: Ambulatory Visit | Attending: Radiation Oncology | Admitting: Radiation Oncology

## 2014-05-14 ENCOUNTER — Telehealth: Payer: Self-pay | Admitting: Oncology

## 2014-05-14 DIAGNOSIS — Z510 Encounter for antineoplastic radiation therapy: Secondary | ICD-10-CM | POA: Diagnosis not present

## 2014-05-14 NOTE — Telephone Encounter (Signed)
Melinda Conrad left a message stating that she has papers that need to be filled out.  Called Raela back and advised her to bring them along tomorrow and that she can drop them off at the Radiation Oncology waiting room desk or bring them to her under treat visit with Dr. Sondra Come.  Tamelia verbalized agreement.

## 2014-05-15 ENCOUNTER — Ambulatory Visit
Admission: RE | Admit: 2014-05-15 | Discharge: 2014-05-15 | Disposition: A | Discharge status: Home / Self Care | Payer: Medicaid Other | Source: Ambulatory Visit | Attending: Radiation Oncology | Admitting: Radiation Oncology

## 2014-05-15 ENCOUNTER — Encounter: Payer: Self-pay | Admitting: Radiation Oncology

## 2014-05-15 VITALS — BP 131/84 | HR 66 | Temp 97.8°F | Ht 63.0 in | Wt 133.1 lb

## 2014-05-15 DIAGNOSIS — Z510 Encounter for antineoplastic radiation therapy: Secondary | ICD-10-CM | POA: Diagnosis not present

## 2014-05-15 DIAGNOSIS — C259 Malignant neoplasm of pancreas, unspecified: Secondary | ICD-10-CM

## 2014-05-15 NOTE — Progress Notes (Signed)
  Radiation Oncology         [phone] ________________________________  Name: Melinda Conrad MRN: [id number]  Date: 05/15/2014  DOB: 04/11/1951  Weekly Radiation Therapy Management  Pancreatic cancer   Primary site: Pancreas   Staging method: AJCC 7th Edition   Clinical: Stage IIA (T3, N0, M0)   Summary: Stage IIA (T3, N0, M0)   Current Dose: 22 Gy     Planned Dose:  50 Gy  Narrative . . . . . . . . The patient presents for routine under treatment assessment.                                   The patient is without complaint. She is feeling much better this week. She denies any problems with constipation. She is taking in 3 cans of Ensure daily she continues to take Xeloda.                                 Set-up films were reviewed.                                 The chart was checked. Physical Findings. . .  height is 5\' 3"  (1.6 m) and weight is 133 lb 1.6 oz (60.374 kg). Her oral temperature is 97.8 F (36.6 C). Her blood pressure is 131/84 and her pulse is 66. . The lungs are clear. The heart has a regular rhythm and rate. The abdomen is soft and nontender with normal bowel sounds. Impression . . . . . . . The patient is tolerating radiation. Plan . . . . . . . . . . . . Continue treatment as planned.  ________________________________   Blair Promise, PhD, MD

## 2014-05-15 NOTE — Progress Notes (Signed)
Melinda Conrad has completed 11 fractions to her pancreas.  She denies pain.  She has been taking oxycodone twice a day for left sided abdominal pain.  She denies nausea.  She reports her constipation is better.  She reports being able to eat solid foods.  She has lost 1 lb from last week.  She is taking Xeloda.  She denies fatigue.  Her skin is intact on her abdomen.

## 2014-05-16 ENCOUNTER — Ambulatory Visit
Admission: RE | Admit: 2014-05-16 | Discharge: 2014-05-16 | Disposition: A | Discharge status: Home / Self Care | Payer: Medicaid Other | Source: Ambulatory Visit | Attending: Radiation Oncology | Admitting: Radiation Oncology

## 2014-05-16 DIAGNOSIS — Z510 Encounter for antineoplastic radiation therapy: Secondary | ICD-10-CM | POA: Diagnosis not present

## 2014-05-17 ENCOUNTER — Ambulatory Visit: Admission: RE | Admit: 2014-05-17 | Payer: Medicaid Other | Source: Ambulatory Visit

## 2014-05-17 ENCOUNTER — Telehealth: Payer: Self-pay | Admitting: Oncology

## 2014-05-17 ENCOUNTER — Other Ambulatory Visit: Payer: Self-pay | Admitting: Radiation Oncology

## 2014-05-17 MED ORDER — OXYCODONE HCL 5 MG PO TABS: 5.0000 mg | ORAL_TABLET | ORAL | Status: DC | PRN

## 2014-05-17 NOTE — Telephone Encounter (Signed)
Melinda Conrad called and said she was not coming to radiation today because she is waiting for a medication delivery for her oxycodone.  She said she has taken her Xeloda this morning.  She is also feeling sick to her stomach.  She said she was going to take her nausea medication and would come to treatment tomorrow.

## 2014-05-18 ENCOUNTER — Ambulatory Visit
Admission: RE | Admit: 2014-05-18 | Discharge: 2014-05-18 | Disposition: A | Discharge status: Home / Self Care | Payer: Medicaid Other | Source: Ambulatory Visit | Attending: Radiation Oncology | Admitting: Radiation Oncology

## 2014-05-18 ENCOUNTER — Other Ambulatory Visit: Payer: Medicaid Other

## 2014-05-18 DIAGNOSIS — Z510 Encounter for antineoplastic radiation therapy: Secondary | ICD-10-CM | POA: Diagnosis not present

## 2014-05-21 ENCOUNTER — Ambulatory Visit
Admission: RE | Admit: 2014-05-21 | Discharge: 2014-05-21 | Disposition: A | Discharge status: Home / Self Care | Payer: Medicaid Other | Source: Ambulatory Visit | Attending: Radiation Oncology | Admitting: Radiation Oncology

## 2014-05-21 DIAGNOSIS — Z510 Encounter for antineoplastic radiation therapy: Secondary | ICD-10-CM | POA: Diagnosis not present

## 2014-05-22 ENCOUNTER — Telehealth: Payer: Self-pay | Admitting: Nutrition

## 2014-05-22 ENCOUNTER — Ambulatory Visit
Admission: RE | Admit: 2014-05-22 | Discharge: 2014-05-22 | Disposition: A | Discharge status: Home / Self Care | Payer: Medicaid Other | Source: Ambulatory Visit | Attending: Radiation Oncology | Admitting: Radiation Oncology

## 2014-05-22 ENCOUNTER — Telehealth: Payer: Self-pay | Admitting: Oncology

## 2014-05-22 ENCOUNTER — Encounter: Payer: Self-pay | Admitting: Radiation Oncology

## 2014-05-22 VITALS — BP 162/88 | HR 68 | Temp 98.2°F | Resp 16 | Ht 63.0 in | Wt 131.5 lb

## 2014-05-22 DIAGNOSIS — Z510 Encounter for antineoplastic radiation therapy: Secondary | ICD-10-CM | POA: Diagnosis not present

## 2014-05-22 DIAGNOSIS — C250 Malignant neoplasm of head of pancreas: Secondary | ICD-10-CM

## 2014-05-22 NOTE — Progress Notes (Signed)
  Radiation Oncology         [phone] ________________________________  Name: Melinda Conrad MRN: [id number]  Date: 05/22/2014  DOB: 1951/05/22  Weekly Radiation Therapy Management  Pancreatic cancer   Primary site: Pancreas   Staging method: AJCC 7th Edition   Clinical: Stage IIA (T3, N0, M0)    Summary: Stage IIA (T3, N0, M0)   Current Dose: 30 Gy     Planned Dose:  50 Gy  Narrative . . . . . . . . The patient presents for routine under treatment assessment.                                   The patient is without complaint  continued problems with constipation. She will be starting the medication for this issue. She denies any nausea. She has some intermittent abdominal pain likely related to her problems with constipation                                 Set-up films were reviewed.                                 The chart was checked. Physical Findings. . .  height is 5\' 3"  (1.6 m) and weight is 131 lb 8 oz (59.648 kg). Her oral temperature is 98.2 F (36.8 C). Her blood pressure is 162/88 and her pulse is 68. Her respiration is 16. . The lungs are clear. The heart has regular rhythm and rate. The abdomen is soft and nontender with normal bowel sounds. Impression . . . . . . . The patient is tolerating radiation. Plan . . . . . . . . . . . . Continue treatment as planned.  ________________________________   Blair Promise, PhD, MD

## 2014-05-22 NOTE — Telephone Encounter (Signed)
Received message from nursing.  Patient requesting samples of ensure.  Attempted to call patient to set up followup appointment and make arrangements for samples of oral nutrition supplements.  However, patient did not answer phone, and phone didn't roll into a voicemail.  Will ask nursing to have patient contact me.

## 2014-05-22 NOTE — Progress Notes (Signed)
Melinda Conrad has completed 15 fractions to her pancreas.  She reports pain at a 4/10 in her left lower abdomen.  She is taking 2 tablets of oxycodone twice a day.  She reports that her appetite is good.  She has lost 2 lbs since last week.  She reports constipation and had blood in her stool yesterday.  She is taking miralax and will start taking sorbitol and psyllium today.  She is taking Xeloda.  She denies nausea and fatigue.  Her skin is intact on her abdomen.

## 2014-05-23 ENCOUNTER — Telehealth: Payer: Self-pay | Admitting: Oncology

## 2014-05-23 ENCOUNTER — Other Ambulatory Visit: Payer: Self-pay | Admitting: *Deleted

## 2014-05-23 ENCOUNTER — Ambulatory Visit
Admission: RE | Admit: 2014-05-23 | Discharge: 2014-05-23 | Disposition: A | Discharge status: Home / Self Care | Payer: Medicaid Other | Source: Ambulatory Visit | Attending: Radiation Oncology | Admitting: Radiation Oncology

## 2014-05-23 ENCOUNTER — Encounter: Payer: Self-pay | Admitting: Nutrition

## 2014-05-23 DIAGNOSIS — C259 Malignant neoplasm of pancreas, unspecified: Secondary | ICD-10-CM

## 2014-05-23 DIAGNOSIS — Z510 Encounter for antineoplastic radiation therapy: Secondary | ICD-10-CM | POA: Diagnosis not present

## 2014-05-23 DIAGNOSIS — Z86010 Personal history of colonic polyps: Secondary | ICD-10-CM

## 2014-05-23 MED ORDER — SORBITOL 70 % PO SOLN: 15.0000 mL | Freq: Two times a day (BID) | ORAL | Status: DC

## 2014-05-23 MED ORDER — PSYLLIUM 0.52 G PO CAPS: 0.5200 g | ORAL_CAPSULE | Freq: Every day | ORAL | Status: DC

## 2014-05-23 NOTE — Progress Notes (Signed)
Provided one complimentary case of Ensure Plus 

## 2014-05-23 NOTE — Telephone Encounter (Signed)
Called Myndi to have her call Ernestene Kiel, Dietician.  Chaye said she had just talked to her.  She also stated that she needed a prescription for sorbitol.  Per epic, Dr. Benay Spice had written the prescription.  Transferred her to Dr. Gearldine Shown nurse's voice mail.

## 2014-05-24 ENCOUNTER — Telehealth: Payer: Self-pay | Admitting: Nurse Practitioner

## 2014-05-24 ENCOUNTER — Ambulatory Visit
Admission: RE | Admit: 2014-05-24 | Discharge: 2014-05-24 | Disposition: A | Discharge status: Home / Self Care | Payer: Medicaid Other | Source: Ambulatory Visit | Attending: Radiation Oncology | Admitting: Radiation Oncology

## 2014-05-24 ENCOUNTER — Other Ambulatory Visit (HOSPITAL_BASED_OUTPATIENT_CLINIC_OR_DEPARTMENT_OTHER): Payer: Medicaid Other

## 2014-05-24 ENCOUNTER — Ambulatory Visit (HOSPITAL_BASED_OUTPATIENT_CLINIC_OR_DEPARTMENT_OTHER): Payer: Medicaid Other | Admitting: Nurse Practitioner

## 2014-05-24 VITALS — BP 163/77 | HR 56 | Temp 98.2°F | Resp 18 | Ht 63.0 in | Wt 130.1 lb

## 2014-05-24 DIAGNOSIS — C250 Malignant neoplasm of head of pancreas: Secondary | ICD-10-CM

## 2014-05-24 DIAGNOSIS — Z510 Encounter for antineoplastic radiation therapy: Secondary | ICD-10-CM | POA: Diagnosis not present

## 2014-05-24 DIAGNOSIS — F329 Major depressive disorder, single episode, unspecified: Secondary | ICD-10-CM

## 2014-05-24 DIAGNOSIS — G893 Neoplasm related pain (acute) (chronic): Secondary | ICD-10-CM

## 2014-05-24 DIAGNOSIS — C259 Malignant neoplasm of pancreas, unspecified: Secondary | ICD-10-CM

## 2014-05-24 DIAGNOSIS — K5900 Constipation, unspecified: Secondary | ICD-10-CM

## 2014-05-24 DIAGNOSIS — F3289 Other specified depressive episodes: Secondary | ICD-10-CM

## 2014-05-24 LAB — CBC WITH DIFFERENTIAL/PLATELET
BASO%: 0.5 % (ref 0.0–2.0)
Basophils Absolute: 0 10*3/uL (ref 0.0–0.1)
EOS%: 2.9 % (ref 0.0–7.0)
Eosinophils Absolute: 0.1 10*3/uL (ref 0.0–0.5)
HEMATOCRIT: 42.1 % (ref 34.8–46.6)
HEMOGLOBIN: 13.7 g/dL (ref 11.6–15.9)
LYMPH%: 14.8 % (ref 14.0–49.7)
MCH: 30.9 pg (ref 25.1–34.0)
MCHC: 32.6 g/dL (ref 31.5–36.0)
MCV: 94.7 fL (ref 79.5–101.0)
MONO#: 0.5 10*3/uL (ref 0.1–0.9)
MONO%: 15.6 % — ABNORMAL HIGH (ref 0.0–14.0)
NEUT#: 2.2 10*3/uL (ref 1.5–6.5)
NEUT%: 66.2 % (ref 38.4–76.8)
Platelets: 169 10*3/uL (ref 145–400)
RBC: 4.45 10*6/uL (ref 3.70–5.45)
RDW: 12.9 % (ref 11.2–14.5)
WBC: 3.4 10*3/uL — ABNORMAL LOW (ref 3.9–10.3)
lymph#: 0.5 10*3/uL — ABNORMAL LOW (ref 0.9–3.3)

## 2014-05-24 LAB — COMPREHENSIVE METABOLIC PANEL (CC13)
ALT: 26 U/L (ref 0–55)
AST: 28 U/L (ref 5–34)
Albumin: 3.7 g/dL (ref 3.5–5.0)
Alkaline Phosphatase: 93 U/L (ref 40–150)
Anion Gap: 8 mEq/L (ref 3–11)
BUN: 14.3 mg/dL (ref 7.0–26.0)
CO2: 24 mEq/L (ref 22–29)
CREATININE: 0.9 mg/dL (ref 0.6–1.1)
Calcium: 9.4 mg/dL (ref 8.4–10.4)
Chloride: 108 mEq/L (ref 98–109)
Glucose: 80 mg/dl (ref 70–140)
Potassium: 3.6 mEq/L (ref 3.5–5.1)
Sodium: 140 mEq/L (ref 136–145)
Total Bilirubin: 0.35 mg/dL (ref 0.20–1.20)
Total Protein: 9.1 g/dL — ABNORMAL HIGH (ref 6.4–8.3)

## 2014-05-24 NOTE — Progress Notes (Signed)
  [facility] OFFICE PROGRESS NOTE   Diagnosis:  Pancreas cancer.  INTERVAL HISTORY:   Melinda Conrad returns as scheduled. She continues radiation and Xeloda. She denies nausea/vomiting. No mouth sores. No diarrhea. No hand or foot pain or redness. Abdominal pain is better. She is taking oxycodone twice a day with good relief. She continues to have problems with constipation. She is taking a stool softener and MiraLAX. She has not started sorbitol.  Objective:  Vital signs in last 24 hours:  Blood pressure 163/77, pulse 56, temperature 98.2 F (36.8 C), temperature source Oral, resp. rate 18, height 5\' 3"  (1.6 m), weight 130 lb 1.6 oz (59.013 kg), SpO2 100.00%.    HEENT: No thrush or ulcers. Resp: Lungs clear bilaterally. Cardio: Regular rate and rhythm. GI: Abdomen soft and nontender. No hepatomegaly. Vascular: No leg edema.  Skin: Palms without erythema.    Lab Results:  Lab Results  Component Value Date   WBC 3.4* 05/24/2014   HGB 13.7 05/24/2014   HCT 42.1 05/24/2014   MCV 94.7 05/24/2014   PLT 169 05/24/2014   NEUTROABS 2.2 05/24/2014    Imaging:  No results found.  Medications: I have reviewed the patient's current medications.  Assessment/Plan: 1. Adenocarcinoma of the head of the pancreas, clinical stage IIa(T3 N0), status post an ERCP brush of the common bile duct stricture 03/06/2014 confirming adenocarcinoma EUS 04/05/2012 consistent with a uT3N0 lesion, ultrasound evidence for invasion of the portal vein  Staging MRI of the abdomen 03/05/2014 confirmed a pancreas mass abutting the undersurface of the proximal main portal vein  Initiation of concurrent capecitabine and radiation on 04/30/2014 2. Pain secondary to #1 3. constipation-likely secondary to narcotic analgesics  4. anorexia/weight loss  5. tobacco and alcohol use  6. history of a colon polyp in February 2015  7. depression     Disposition: [name] appears stable. She continues  radiation and Xeloda. She will begin sorbitol for the constipation. If not effective she will try magnesium citrate.  We scheduled a return visit in approximately 2 weeks. She will contact the office in the interim with any problems.    Ned Card ANP/GNP-BC   05/24/2014  11:51 AM

## 2014-05-24 NOTE — Telephone Encounter (Signed)
, °

## 2014-05-25 ENCOUNTER — Ambulatory Visit: Payer: Medicaid Other

## 2014-05-29 ENCOUNTER — Encounter (INDEPENDENT_AMBULATORY_CARE_PROVIDER_SITE_OTHER): Payer: Medicaid Other | Admitting: General Surgery

## 2014-05-29 ENCOUNTER — Ambulatory Visit
Admission: RE | Admit: 2014-05-29 | Discharge: 2014-05-29 | Disposition: A | Discharge status: Home / Self Care | Payer: Medicaid Other | Source: Ambulatory Visit | Attending: Radiation Oncology | Admitting: Radiation Oncology

## 2014-05-29 DIAGNOSIS — Z510 Encounter for antineoplastic radiation therapy: Secondary | ICD-10-CM | POA: Diagnosis not present

## 2014-05-30 ENCOUNTER — Ambulatory Visit: Payer: Medicaid Other | Admitting: Radiation Oncology

## 2014-05-30 ENCOUNTER — Telehealth: Payer: Self-pay | Admitting: Oncology

## 2014-05-30 ENCOUNTER — Ambulatory Visit: Payer: Medicaid Other

## 2014-05-30 NOTE — Telephone Encounter (Signed)
Melinda Conrad left a message saying that she would not be coming in for treatment today due to not feeling well.  Called her back and she said she was hurting and did not have any energy to come in today.  She said she had just taken her pain medication and it had not had enough time to work yet.  She said she is almost out of her pain medication and would like a refill.  She has 3 tablets left.  Advised her that she will need to pick up the script and she said she would be OK picking it up tomorrow when she is here for treatment.

## 2014-05-31 ENCOUNTER — Ambulatory Visit
Admission: RE | Admit: 2014-05-31 | Discharge: 2014-05-31 | Disposition: A | Discharge status: Home / Self Care | Payer: Medicaid Other | Source: Ambulatory Visit | Attending: Radiation Oncology | Admitting: Radiation Oncology

## 2014-05-31 ENCOUNTER — Encounter: Payer: Self-pay | Admitting: Radiation Oncology

## 2014-05-31 VITALS — BP 113/65 | HR 67 | Temp 98.4°F | Resp 16 | Ht 63.0 in | Wt 128.0 lb

## 2014-05-31 DIAGNOSIS — C259 Malignant neoplasm of pancreas, unspecified: Secondary | ICD-10-CM

## 2014-05-31 DIAGNOSIS — Z510 Encounter for antineoplastic radiation therapy: Secondary | ICD-10-CM | POA: Diagnosis not present

## 2014-05-31 MED ORDER — OXYCODONE HCL 5 MG PO TABS: 5.0000 mg | ORAL_TABLET | ORAL | Status: DC | PRN

## 2014-05-31 NOTE — Progress Notes (Signed)
  Radiation Oncology         [phone] ________________________________  Name: Melinda Conrad MRN: [id number]  Date: 05/31/2014  DOB: 1951-06-21  Weekly Radiation Therapy Management  Pancreatic cancer   Primary site: Pancreas   Staging method: AJCC 7th Edition   Clinical: Stage IIA (T3, N0, M0)    Summary: Stage IIA (T3, N0, M0)   Current Dose: 38 Gy     Planned Dose:  50 Gy  Narrative . . . . . . . . The patient presents for routine under treatment assessment.                                   The patient is without complaint, except for some low back pain. She has run out of her pain medication and I refilled this today. She did have one episode of nausea and emesis yesterday.                                 Set-up films were reviewed.                                 The chart was checked. Physical Findings. . .  height is 5\' 3"  (1.6 m) and weight is 128 lb (58.06 kg). Her oral temperature is 98.4 F (36.9 C). Her blood pressure is 113/65 and her pulse is 67. Her respiration is 16. . The lungs are clear. The heart has a regular rhythm and rate. The abdomen is soft and nontender with normal bowel sounds. Impression . . . . . . . The patient is tolerating radiation. Plan . . . . . . . . . . . . Continue treatment as planned. She continues on Xeloda as a radiation sensitizer.  ________________________________   Blair Promise, PhD, MD

## 2014-05-31 NOTE — Progress Notes (Signed)
Melinda Conrad has completed 19 fractions to her pancreas.  She reports pain 6/10 today in her lower back.  She reports yesterday the pain was radiating from her lower back around to her abdomen.  She also reports she gets injections in her back with her last one in November/December of last year.  She is taking 2 ibuprofen tablets and 2 oxycodone tablets per day.  She needs a refill on oxycodone.  She reports that she vomiting yesterday and took her nausea medication.  She is taking Xeloda.  She has lost 2 lbs since last week.  She is drinking 3 ensures per day.  She reports fatigue.  The skin on her abdomen is intact.

## 2014-06-01 ENCOUNTER — Ambulatory Visit
Admission: RE | Admit: 2014-06-01 | Discharge: 2014-06-01 | Disposition: A | Discharge status: Home / Self Care | Payer: Medicaid Other | Source: Ambulatory Visit | Attending: Radiation Oncology | Admitting: Radiation Oncology

## 2014-06-01 DIAGNOSIS — Z510 Encounter for antineoplastic radiation therapy: Secondary | ICD-10-CM | POA: Diagnosis not present

## 2014-06-04 ENCOUNTER — Ambulatory Visit: Payer: Medicaid Other

## 2014-06-04 ENCOUNTER — Ambulatory Visit
Admission: RE | Admit: 2014-06-04 | Discharge: 2014-06-04 | Disposition: A | Discharge status: Home / Self Care | Payer: Medicaid Other | Source: Ambulatory Visit | Attending: Radiation Oncology | Admitting: Radiation Oncology

## 2014-06-04 DIAGNOSIS — Z510 Encounter for antineoplastic radiation therapy: Secondary | ICD-10-CM | POA: Diagnosis not present

## 2014-06-05 ENCOUNTER — Encounter: Payer: Self-pay | Admitting: Radiation Oncology

## 2014-06-05 ENCOUNTER — Ambulatory Visit
Admission: RE | Admit: 2014-06-05 | Discharge: 2014-06-05 | Disposition: A | Discharge status: Home / Self Care | Payer: Medicaid Other | Source: Ambulatory Visit | Attending: Radiation Oncology | Admitting: Radiation Oncology

## 2014-06-05 ENCOUNTER — Ambulatory Visit: Payer: Medicaid Other

## 2014-06-05 VITALS — BP 193/81 | Temp 98.6°F | Ht 63.0 in | Wt 130.1 lb

## 2014-06-05 DIAGNOSIS — Z510 Encounter for antineoplastic radiation therapy: Secondary | ICD-10-CM | POA: Diagnosis not present

## 2014-06-05 DIAGNOSIS — C259 Malignant neoplasm of pancreas, unspecified: Secondary | ICD-10-CM

## 2014-06-05 NOTE — Progress Notes (Signed)
Melinda Conrad has received 22 fractions to her abdomen for pancreatic cancer.  She c/o level 3-4 pain presently, but she reports relief when she stakes her pain oxycodone.  Denies any nausea and vomiting and also denies constipation which has been a chronic issue in the past.  Has not experienced any diarrhea while taking Xeloda.  BP elevated today since she did not take her BP medication.

## 2014-06-05 NOTE — Progress Notes (Signed)
  Radiation Oncology         [phone] ________________________________  Name: Melinda Conrad MRN: [id number]  Date: 06/05/2014  DOB: Feb 17, 1951  Weekly Radiation Therapy Management   Current Dose: 44 Gy     Planned Dose:  50 Gy  Narrative . . . . . . . . The patient presents for routine under treatment assessment.                                   The patient is without complaint except for continued pain in the upper abdominal area. This is controlled well with oxycodone. Patient denies any nausea or bowel problems at this time.                                 Set-up films were reviewed.                                 The chart was checked. Physical Findings. . .  height is 5\' 3"  (1.6 m) and weight is 130 lb 1.6 oz (59.013 kg). Her temperature is 98.6 F (37 C). Her blood pressure is 193/81. . The lungs are clear. The heart has regular rhythm and rate. The abdomen is soft and nontender with normal bowel sounds. Impression . . . . . . . The patient is tolerating radiation. Plan . . . . . . . . . . . . Continue treatment as planned.  ________________________________   Blair Promise, PhD, MD

## 2014-06-06 ENCOUNTER — Ambulatory Visit: Payer: Medicaid Other

## 2014-06-06 ENCOUNTER — Ambulatory Visit
Admission: RE | Admit: 2014-06-06 | Discharge: 2014-06-06 | Disposition: A | Discharge status: Home / Self Care | Payer: Medicaid Other | Source: Ambulatory Visit | Attending: Radiation Oncology | Admitting: Radiation Oncology

## 2014-06-06 DIAGNOSIS — Z510 Encounter for antineoplastic radiation therapy: Secondary | ICD-10-CM | POA: Diagnosis not present

## 2014-06-07 ENCOUNTER — Ambulatory Visit: Payer: Medicaid Other

## 2014-06-07 ENCOUNTER — Encounter (INDEPENDENT_AMBULATORY_CARE_PROVIDER_SITE_OTHER): Payer: Self-pay | Admitting: General Surgery

## 2014-06-07 ENCOUNTER — Ambulatory Visit
Admission: RE | Admit: 2014-06-07 | Discharge: 2014-06-07 | Disposition: A | Discharge status: Home / Self Care | Payer: Medicaid Other | Source: Ambulatory Visit | Attending: Radiation Oncology | Admitting: Radiation Oncology

## 2014-06-07 DIAGNOSIS — Z510 Encounter for antineoplastic radiation therapy: Secondary | ICD-10-CM | POA: Diagnosis not present

## 2014-06-08 ENCOUNTER — Other Ambulatory Visit (HOSPITAL_BASED_OUTPATIENT_CLINIC_OR_DEPARTMENT_OTHER): Payer: Medicaid Other

## 2014-06-08 ENCOUNTER — Ambulatory Visit: Payer: Medicaid Other

## 2014-06-08 ENCOUNTER — Telehealth: Payer: Self-pay | Admitting: Oncology

## 2014-06-08 ENCOUNTER — Ambulatory Visit (HOSPITAL_BASED_OUTPATIENT_CLINIC_OR_DEPARTMENT_OTHER): Payer: Medicaid Other | Admitting: Nurse Practitioner

## 2014-06-08 ENCOUNTER — Ambulatory Visit
Admission: RE | Admit: 2014-06-08 | Discharge: 2014-06-08 | Disposition: A | Discharge status: Home / Self Care | Payer: Medicaid Other | Source: Ambulatory Visit | Attending: Radiation Oncology | Admitting: Radiation Oncology

## 2014-06-08 VITALS — BP 169/78 | HR 52 | Temp 98.4°F | Resp 19 | Ht 63.0 in | Wt 126.7 lb

## 2014-06-08 DIAGNOSIS — C250 Malignant neoplasm of head of pancreas: Secondary | ICD-10-CM

## 2014-06-08 DIAGNOSIS — F1010 Alcohol abuse, uncomplicated: Secondary | ICD-10-CM

## 2014-06-08 DIAGNOSIS — F3289 Other specified depressive episodes: Secondary | ICD-10-CM

## 2014-06-08 DIAGNOSIS — G893 Neoplasm related pain (acute) (chronic): Secondary | ICD-10-CM

## 2014-06-08 DIAGNOSIS — E876 Hypokalemia: Secondary | ICD-10-CM

## 2014-06-08 DIAGNOSIS — Z510 Encounter for antineoplastic radiation therapy: Secondary | ICD-10-CM | POA: Diagnosis not present

## 2014-06-08 DIAGNOSIS — C259 Malignant neoplasm of pancreas, unspecified: Secondary | ICD-10-CM

## 2014-06-08 DIAGNOSIS — K5900 Constipation, unspecified: Secondary | ICD-10-CM

## 2014-06-08 DIAGNOSIS — F329 Major depressive disorder, single episode, unspecified: Secondary | ICD-10-CM

## 2014-06-08 DIAGNOSIS — R634 Abnormal weight loss: Secondary | ICD-10-CM

## 2014-06-08 DIAGNOSIS — F17200 Nicotine dependence, unspecified, uncomplicated: Secondary | ICD-10-CM

## 2014-06-08 LAB — CBC WITH DIFFERENTIAL/PLATELET
BASO%: 0.5 % (ref 0.0–2.0)
BASOS ABS: 0 10*3/uL (ref 0.0–0.1)
EOS ABS: 0.1 10*3/uL (ref 0.0–0.5)
EOS%: 2.3 % (ref 0.0–7.0)
HCT: 41.4 % (ref 34.8–46.6)
HEMOGLOBIN: 13.6 g/dL (ref 11.6–15.9)
LYMPH%: 10.9 % — ABNORMAL LOW (ref 14.0–49.7)
MCH: 30.8 pg (ref 25.1–34.0)
MCHC: 32.8 g/dL (ref 31.5–36.0)
MCV: 94 fL (ref 79.5–101.0)
MONO#: 0.5 10*3/uL (ref 0.1–0.9)
MONO%: 14.5 % — AB (ref 0.0–14.0)
NEUT%: 71.8 % (ref 38.4–76.8)
NEUTROS ABS: 2.4 10*3/uL (ref 1.5–6.5)
Platelets: 171 10*3/uL (ref 145–400)
RBC: 4.41 10*6/uL (ref 3.70–5.45)
RDW: 14.7 % — AB (ref 11.2–14.5)
WBC: 3.3 10*3/uL — ABNORMAL LOW (ref 3.9–10.3)
lymph#: 0.4 10*3/uL — ABNORMAL LOW (ref 0.9–3.3)

## 2014-06-08 LAB — COMPREHENSIVE METABOLIC PANEL (CC13)
ALBUMIN: 3.4 g/dL — AB (ref 3.5–5.0)
ALT: 15 U/L (ref 0–55)
ANION GAP: 11 meq/L (ref 3–11)
AST: 24 U/L (ref 5–34)
Alkaline Phosphatase: 89 U/L (ref 40–150)
BUN: 14.1 mg/dL (ref 7.0–26.0)
CALCIUM: 9.3 mg/dL (ref 8.4–10.4)
CO2: 22 meq/L (ref 22–29)
Chloride: 106 mEq/L (ref 98–109)
Creatinine: 0.8 mg/dL (ref 0.6–1.1)
GLUCOSE: 175 mg/dL — AB (ref 70–140)
Sodium: 138 mEq/L (ref 136–145)
Total Bilirubin: 0.47 mg/dL (ref 0.20–1.20)
Total Protein: 8.7 g/dL — ABNORMAL HIGH (ref 6.4–8.3)

## 2014-06-08 MED ORDER — POTASSIUM CHLORIDE CRYS ER 20 MEQ PO TBCR: 20.0000 meq | EXTENDED_RELEASE_TABLET | Freq: Two times a day (BID) | ORAL | Status: DC

## 2014-06-08 MED ORDER — OXYCODONE HCL ER 10 MG PO T12A: 10.0000 mg | EXTENDED_RELEASE_TABLET | Freq: Two times a day (BID) | ORAL | Status: DC

## 2014-06-08 NOTE — Progress Notes (Signed)
  [facility] OFFICE PROGRESS NOTE   Diagnosis:  Pancreas cancer  INTERVAL HISTORY:   Melinda Conrad returns as scheduled. She completed her final radiation treatment today. She has one dose of Xeloda remaining. She denies nausea/vomiting. No mouth sores. No diarrhea. No hand or foot pain or redness. She notes no significant change in the abdominal pain. She continues oxycodone twice a day but notes that the effect of the pain medication only lasts about 3 hours.  Objective:  Vital signs in last 24 hours:  Blood pressure 169/78, pulse 52, temperature 98.4 F (36.9 C), temperature source Oral, resp. rate 19, height 5\' 3"  (1.6 m), weight 126 lb 11.2 oz (57.471 kg).    HEENT: No thrush or ulcers. Resp: Lungs clear bilaterally. Cardio: Regular rate and rhythm. GI: Abdomen is soft with mild tenderness at the right upper quadrant medially with associated fullness. No discrete mass. No hepatomegaly. Vascular: No leg edema.  Skin: Palms with hyperpigmentation. No erythema or skin breakdown.    Lab Results:  Lab Results  Component Value Date   WBC 3.3* 06/08/2014   HGB 13.6 06/08/2014   HCT 41.4 06/08/2014   MCV 94.0 06/08/2014   PLT 171 06/08/2014   NEUTROABS 2.4 06/08/2014    Imaging:  No results found.  Medications: I have reviewed the patient's current medications.  Assessment/Plan: 1. Adenocarcinoma of the head of the pancreas, clinical stage IIa(T3 N0), status post an ERCP brush of the common bile duct stricture 03/06/2014 confirming adenocarcinoma EUS 04/05/2012 consistent with a uT3N0 lesion, ultrasound evidence for invasion of the portal vein  Staging MRI of the abdomen 03/05/2014 confirmed a pancreas mass abutting the undersurface of the proximal main portal vein  Initiation of concurrent capecitabine and radiation on 04/30/2014; completed 06/08/2014. 2. Pain secondary to #1 3. constipation-likely secondary to narcotic analgesics  4. anorexia/weight loss   5. tobacco and alcohol use  6. history of a colon polyp in February 2015  7. depression    Disposition: Melinda Conrad appears stable. She has completed the course of radiation and will take the final dose of Xeloda this afternoon. We made a referral to Dr. Barry Dienes for reevaluation for possible surgery following completion of neoadjuvant therapy.  She continues to have abdominal pain. She will begin OxyContin 10 mg every 12 hours and continue oxycodone as needed.  She has hypokalemia on labs today. She will take K-dur 20 mEq twice daily for 3 days and then once daily. She will return for a followup basic metabolic panel in one week.  We will see her back in 2 weeks to reevaluate pain control. She will contact the office in the interim with any problems.  Plan reviewed with Dr. Benay Spice.   Ned Card ANP/GNP-BC   06/08/2014  11:41 AM

## 2014-06-08 NOTE — Telephone Encounter (Signed)
gv and printed appt sched and avs for pt for Sept adn OCT...Marland KitchenMarland KitchenSent msg to TM and [facility] to fax record to [phone]...Marland KitchenMarland Kitchentheir office will contact me and pt with appt once records recieved.Marland KitchenMarland KitchenMarland Kitchen

## 2014-06-11 ENCOUNTER — Telehealth: Payer: Self-pay | Admitting: Oncology

## 2014-06-11 NOTE — Telephone Encounter (Signed)
Faxed pt medical records to Dr. Byerly °

## 2014-06-12 ENCOUNTER — Telehealth: Payer: Self-pay | Admitting: *Deleted

## 2014-06-12 ENCOUNTER — Telehealth: Payer: Self-pay | Admitting: Oncology

## 2014-06-12 ENCOUNTER — Other Ambulatory Visit: Payer: Self-pay | Admitting: *Deleted

## 2014-06-12 DIAGNOSIS — C259 Malignant neoplasm of pancreas, unspecified: Secondary | ICD-10-CM

## 2014-06-12 MED ORDER — POTASSIUM CHLORIDE CRYS ER 20 MEQ PO TBCR: EXTENDED_RELEASE_TABLET | ORAL | Status: DC

## 2014-06-12 MED ORDER — OXYCODONE HCL ER 10 MG PO T12A: 10.0000 mg | EXTENDED_RELEASE_TABLET | Freq: Two times a day (BID) | ORAL | Status: DC

## 2014-06-12 NOTE — Telephone Encounter (Signed)
Ronny Bacon called for clarification of potassium chloride order.  Noted 06-08-2014  Office note which reads due to hypokalemia patient is to take potassium 20 meq bid x 3 days and then take once daily.

## 2014-06-12 NOTE — Telephone Encounter (Signed)
Danyle called requesting a refill on oxyCODONE (OXY IR/ROXICODONE) 5 MG immediate release tablet.  She last had them filled on 05/31/14 and has 4-5 tablets left.  Ned Card, NP gave her a prescription for OxyCODONE (OXYCONTIN) 10 mg T12A 12 hr tablet that she has not filled yet.  Everest said she was told she could continue taking the 5 mg tablets in between for breakthrough pain. She would like to pick the script up on Friday.

## 2014-06-12 NOTE — Telephone Encounter (Signed)
Notified pt that pain re-fill request is ready for pick-up at her convenience.  Pt verbalized understanding and expressed appreciation for call back.

## 2014-06-13 ENCOUNTER — Other Ambulatory Visit: Payer: Self-pay | Admitting: *Deleted

## 2014-06-13 ENCOUNTER — Other Ambulatory Visit: Payer: Self-pay | Admitting: Radiation Oncology

## 2014-06-13 ENCOUNTER — Telehealth: Payer: Self-pay | Admitting: Oncology

## 2014-06-13 MED ORDER — OXYCODONE HCL 5 MG PO TABS: 5.0000 mg | ORAL_TABLET | ORAL | Status: DC | PRN

## 2014-06-13 NOTE — Telephone Encounter (Signed)
Called Ha[place] and let her know the prescription for oxycodone is ready in the radiation nursing area.  She verbalized agreement.

## 2014-06-14 ENCOUNTER — Encounter: Payer: Self-pay | Admitting: *Deleted

## 2014-06-14 ENCOUNTER — Encounter: Payer: Self-pay | Admitting: Oncology

## 2014-06-14 ENCOUNTER — Telehealth: Payer: Self-pay | Admitting: *Deleted

## 2014-06-14 NOTE — Progress Notes (Signed)
RECEIVED A FAX FROM [facility] CONCERNING A PRIOR AUTHORIZATION FOR OXYCONTIN. THIS REQUEST WAS PLACED IN THE MANAGED CARE BIN. 

## 2014-06-14 NOTE — Progress Notes (Signed)
Faxed oxycontin pa form to [place] [employer] °

## 2014-06-14 NOTE — Telephone Encounter (Signed)
Patient left VM that MD needs to talk to her insurance company about pain medication. Called back and made her aware that the PA form has been completed and sent back to [place] their approval. She can follow up with the pharmacy.

## 2014-06-15 ENCOUNTER — Telehealth: Payer: Self-pay | Admitting: Oncology

## 2014-06-15 ENCOUNTER — Other Ambulatory Visit: Payer: Medicaid Other

## 2014-06-15 ENCOUNTER — Ambulatory Visit: Payer: Medicaid Other

## 2014-06-15 NOTE — Telephone Encounter (Signed)
RECEIVED NOTE FROM KIM IN LAB RE PT CX LAB TODAY AND WANTING TO R/S TO 10/2 W/FU. RETURNED CALL AND S/W PT RE R/S 9/25 LAB TO 10/2 W/FU. PT HAS NEW D/T FOR LB/BS 10/2 @ 8:30AM.

## 2014-06-22 ENCOUNTER — Telehealth: Payer: Self-pay | Admitting: Oncology

## 2014-06-22 ENCOUNTER — Other Ambulatory Visit: Payer: Self-pay | Admitting: *Deleted

## 2014-06-22 ENCOUNTER — Ambulatory Visit (HOSPITAL_BASED_OUTPATIENT_CLINIC_OR_DEPARTMENT_OTHER): Payer: Medicaid Other | Admitting: Oncology

## 2014-06-22 ENCOUNTER — Other Ambulatory Visit (HOSPITAL_BASED_OUTPATIENT_CLINIC_OR_DEPARTMENT_OTHER): Payer: Medicaid Other

## 2014-06-22 VITALS — BP 114/89 | HR 84 | Temp 98.5°F | Resp 18 | Ht 63.0 in | Wt 125.3 lb

## 2014-06-22 DIAGNOSIS — C259 Malignant neoplasm of pancreas, unspecified: Secondary | ICD-10-CM

## 2014-06-22 DIAGNOSIS — G893 Neoplasm related pain (acute) (chronic): Secondary | ICD-10-CM

## 2014-06-22 DIAGNOSIS — C250 Malignant neoplasm of head of pancreas: Secondary | ICD-10-CM

## 2014-06-22 DIAGNOSIS — F1099 Alcohol use, unspecified with unspecified alcohol-induced disorder: Secondary | ICD-10-CM

## 2014-06-22 DIAGNOSIS — Z720 Tobacco use: Secondary | ICD-10-CM

## 2014-06-22 DIAGNOSIS — F329 Major depressive disorder, single episode, unspecified: Secondary | ICD-10-CM

## 2014-06-22 DIAGNOSIS — K5900 Constipation, unspecified: Secondary | ICD-10-CM

## 2014-06-22 LAB — CBC WITH DIFFERENTIAL/PLATELET
BASO%: 0.7 % (ref 0.0–2.0)
Basophils Absolute: 0 10*3/uL (ref 0.0–0.1)
EOS%: 2.9 % (ref 0.0–7.0)
Eosinophils Absolute: 0.1 10*3/uL (ref 0.0–0.5)
HCT: 40.1 % (ref 34.8–46.6)
HEMOGLOBIN: 13.7 g/dL (ref 11.6–15.9)
LYMPH%: 11 % — ABNORMAL LOW (ref 14.0–49.7)
MCH: 31.4 pg (ref 25.1–34.0)
MCHC: 34.2 g/dL (ref 31.5–36.0)
MCV: 91.8 fL (ref 79.5–101.0)
MONO#: 0.4 10*3/uL (ref 0.1–0.9)
MONO%: 9.8 % (ref 0.0–14.0)
NEUT#: 3.2 10*3/uL (ref 1.5–6.5)
NEUT%: 75.6 % (ref 38.4–76.8)
Platelets: 168 10*3/uL (ref 145–400)
RBC: 4.37 10*6/uL (ref 3.70–5.45)
RDW: 15.2 % — ABNORMAL HIGH (ref 11.2–14.5)
WBC: 4.2 10*3/uL (ref 3.9–10.3)
lymph#: 0.5 10*3/uL — ABNORMAL LOW (ref 0.9–3.3)
nRBC: 0 % (ref 0–0)

## 2014-06-22 LAB — BASIC METABOLIC PANEL (CC13)
Anion Gap: 9 mEq/L (ref 3–11)
BUN: 10.7 mg/dL (ref 7.0–26.0)
CO2: 22 mEq/L (ref 22–29)
Calcium: 9.6 mg/dL (ref 8.4–10.4)
Chloride: 110 mEq/L — ABNORMAL HIGH (ref 98–109)
Creatinine: 0.9 mg/dL (ref 0.6–1.1)
Glucose: 79 mg/dl (ref 70–140)
POTASSIUM: 4.1 meq/L (ref 3.5–5.1)
Sodium: 141 mEq/L (ref 136–145)

## 2014-06-22 MED ORDER — OXYCODONE HCL 5 MG PO TABS: 5.0000 mg | ORAL_TABLET | ORAL | Status: DC | PRN

## 2014-06-22 NOTE — Progress Notes (Signed)
  [facility] OFFICE PROGRESS NOTE   Diagnosis: Pancreas  INTERVAL HISTORY:   Melinda Conrad returns as scheduled. She continues to have abdomen and back pain. She is taking approximately 4 oxycodone tablets per day for relief of pain. She has not filled the OxyContin prescription. She has not seen Dr. Barry Dienes. She reports the constipation has improved with MiraLAX and sorbitol.  Objective:  Vital signs in last 24 hours:  Blood pressure 114/89, pulse 84, temperature 98.5 F (36.9 C), temperature source Oral, resp. rate 18, height 5\' 3"  (1.6 m), weight 125 lb 4.8 oz (56.836 kg), SpO2 99.00%.    HEENT: Neck without mass Lymphatics:  No cervical or supraclavicular nodes Resp: Lungs clear bilaterally Cardio: Regular rate and rhythm GI: No hepatosplenomegaly, no mass. Vascular: No leg edema   Lab Results:  Lab Results  Component Value Date   WBC 4.2 06/22/2014   HGB 13.7 06/22/2014   HCT 40.1 06/22/2014   MCV 91.8 06/22/2014   PLT 168 06/22/2014   NEUTROABS 3.2 06/22/2014   Potassium 4.1, creatinine 0.9    Medications: I have reviewed the patient's current medications.  Assessment/Plan: 1. Adenocarcinoma of the head of the pancreas, clinical stage IIa(T3 N0), status post an ERCP brush of the common bile duct stricture 03/06/2014 confirming adenocarcinoma EUS 04/05/2012 consistent with a uT3N0 lesion, ultrasound evidence for invasion of the portal vein  Staging MRI of the abdomen 03/05/2014 confirmed a pancreas mass abutting the undersurface of the proximal main portal vein  Initiation of concurrent capecitabine and radiation on 04/30/2014; completed 06/08/2014. 2. Pain secondary to #1 3. constipation-likely secondary to narcotic analgesics, improved  4. anorexia/weight loss  5. tobacco and alcohol use  6. history of a colon polyp in February 2015  7. depression   Disposition:  She appears stable. She will begin OxyContin today. She will continue oxycodone as  needed for breakthrough pain.  We will make another referral to Dr. Barry Dienes for discussion of surgery.  Melinda Conrad will return for an office visit 07/26/2014.  Betsy Coder, MD  06/22/2014  10:21 AM

## 2014-06-22 NOTE — Telephone Encounter (Signed)
gv adn printed appt sched and avs fo rpt for OCT and NOV...gv and printed appt sched and avs for pt for NOV and OCT...Marland KitchenMarland KitchenPt sched to see Dr. Barry Dienes on 10.12 @ 2:45pm

## 2014-06-22 NOTE — Telephone Encounter (Signed)
gv and printed appt sched and avs for pt for NOV and OCT...Marland KitchenMarland KitchenPt sched to see Dr. Barry Dienes on 10.12 @ 2:45pm

## 2014-06-30 ENCOUNTER — Encounter: Payer: Self-pay | Admitting: Radiation Oncology

## 2014-06-30 NOTE — Progress Notes (Signed)
  Radiation Oncology         [phone] ________________________________  Name: [name] MRN: [id number]  Date: 06/30/2014  DOB: [birth year]  End of Treatment Note  Diagnosis:   Pancreatic cancer   Primary site: Pancreas   Staging method: AJCC 7th Edition   Clinical: Stage IIA (T3, N0, M0)    Summary: Stage IIA (T3, N0, M0)   Indication for treatment:  Definitive treatment, possibly pre-op       Radiation treatment dates:   August 10 through September 18  Site/dose:   Pancreas-50 gray in 25 fractions; local regional area 45 gray in 25 fractions (simultaneous integrated boost to the pancreatic head)  Beams/energy:   Intensity modulated patient therapy, volumetric ARC treatments, 6 MV photons  Narrative: The patient tolerated radiation treatment relatively well.   She did have some nausea with her treatments. She continues to have pain in the upper, area throughout much of her treatment, but manageable with pain medication  Plan: The patient has completed radiation treatment. The patient will return to radiation oncology clinic for routine followup in one month. I advised them to call or return sooner if they have any questions or concerns related to their recovery or treatment.  -----------------------------------  Blair Promise, PhD, MD

## 2014-07-02 ENCOUNTER — Other Ambulatory Visit (INDEPENDENT_AMBULATORY_CARE_PROVIDER_SITE_OTHER): Payer: Self-pay | Admitting: General Surgery

## 2014-07-02 ENCOUNTER — Encounter: Payer: Medicaid Other | Admitting: Nutrition

## 2014-07-02 ENCOUNTER — Other Ambulatory Visit (INDEPENDENT_AMBULATORY_CARE_PROVIDER_SITE_OTHER): Payer: Self-pay

## 2014-07-02 DIAGNOSIS — C250 Malignant neoplasm of head of pancreas: Secondary | ICD-10-CM

## 2014-07-03 ENCOUNTER — Telehealth: Payer: Self-pay | Admitting: *Deleted

## 2014-07-03 ENCOUNTER — Telehealth: Payer: Self-pay | Admitting: Oncology

## 2014-07-03 ENCOUNTER — Other Ambulatory Visit: Payer: Self-pay | Admitting: *Deleted

## 2014-07-03 DIAGNOSIS — C259 Malignant neoplasm of pancreas, unspecified: Secondary | ICD-10-CM

## 2014-07-03 MED ORDER — OXYCODONE HCL 5 MG PO TABS: 5.0000 mg | ORAL_TABLET | ORAL | Status: DC | PRN

## 2014-07-03 MED ORDER — OXYCODONE HCL ER 10 MG PO T12A: 10.0000 mg | EXTENDED_RELEASE_TABLET | Freq: Two times a day (BID) | ORAL | Status: DC

## 2014-07-03 NOTE — Telephone Encounter (Signed)
Melinda Conrad called back to check on her pain medications.  Advised her that I have not heard back yet.  Melinda Conrad said she has called the cab company and they do not have her medications.

## 2014-07-03 NOTE — Telephone Encounter (Signed)
Call received from [place] with radiation.  Patient called them.  "I need refill on oxycontin and oxycodone.  Lost both bottles in cab yesterday on my way to a doctor's appointment."  This nurse instructed Santiago Glad to have patient contact cab company and this nurse will notify providers.

## 2014-07-03 NOTE — Telephone Encounter (Signed)
Melinda Conrad called and said she left all of her medications in a cab yesterday when she went to se Dr. Barry Dienes.  She is requesting new prescriptions for her pain medications, oxy IR 5 mg and oxycontin 10 mg.   She said all of her other medications are being mailed to her.  Advised her that Dr. Benay Spice had written the last prescriptions for her pain medications so I will contact his nurse and get back to her.  Laquandra verbalized agreement.

## 2014-07-03 NOTE — Telephone Encounter (Signed)
Spoke with pt and she reports: "I left my meds in the cab on my way to Dr. Marlowe Aschoff office yesterday; can I get a re-fill of my pain meds?"  Per Dr. Benay Spice; notified pt that pain script re-fill will be ready for pick-up tomorrow.   Pt verbalized understanding and expressed appreciation for call back.

## 2014-07-06 ENCOUNTER — Other Ambulatory Visit: Payer: Medicaid Other

## 2014-07-11 ENCOUNTER — Other Ambulatory Visit (INDEPENDENT_AMBULATORY_CARE_PROVIDER_SITE_OTHER): Payer: Self-pay | Admitting: *Deleted

## 2014-07-11 DIAGNOSIS — C250 Malignant neoplasm of head of pancreas: Secondary | ICD-10-CM

## 2014-07-12 ENCOUNTER — Other Ambulatory Visit: Payer: Medicaid Other

## 2014-07-12 ENCOUNTER — Inpatient Hospital Stay: Admission: RE | Admit: 2014-07-12 | Payer: Medicaid Other | Source: Ambulatory Visit

## 2014-07-13 ENCOUNTER — Emergency Department (HOSPITAL_COMMUNITY): Payer: Medicaid Other

## 2014-07-13 ENCOUNTER — Encounter (HOSPITAL_COMMUNITY): Payer: Self-pay | Admitting: Emergency Medicine

## 2014-07-13 ENCOUNTER — Other Ambulatory Visit (INDEPENDENT_AMBULATORY_CARE_PROVIDER_SITE_OTHER): Payer: Self-pay | Admitting: General Surgery

## 2014-07-13 ENCOUNTER — Telehealth: Payer: Self-pay | Admitting: Nutrition

## 2014-07-13 ENCOUNTER — Emergency Department (HOSPITAL_COMMUNITY)
Admission: EM | Admit: 2014-07-13 | Discharge: 2014-07-13 | Disposition: A | Discharge status: Home / Self Care | Payer: Medicaid Other | Attending: Emergency Medicine | Admitting: Emergency Medicine

## 2014-07-13 DIAGNOSIS — K5900 Constipation, unspecified: Secondary | ICD-10-CM | POA: Diagnosis not present

## 2014-07-13 DIAGNOSIS — Z720 Tobacco use: Secondary | ICD-10-CM | POA: Insufficient documentation

## 2014-07-13 DIAGNOSIS — M810 Age-related osteoporosis without current pathological fracture: Secondary | ICD-10-CM | POA: Diagnosis not present

## 2014-07-13 DIAGNOSIS — F329 Major depressive disorder, single episode, unspecified: Secondary | ICD-10-CM | POA: Diagnosis not present

## 2014-07-13 DIAGNOSIS — C259 Malignant neoplasm of pancreas, unspecified: Secondary | ICD-10-CM | POA: Insufficient documentation

## 2014-07-13 DIAGNOSIS — R112 Nausea with vomiting, unspecified: Secondary | ICD-10-CM | POA: Diagnosis not present

## 2014-07-13 DIAGNOSIS — M1990 Unspecified osteoarthritis, unspecified site: Secondary | ICD-10-CM | POA: Insufficient documentation

## 2014-07-13 DIAGNOSIS — C250 Malignant neoplasm of head of pancreas: Secondary | ICD-10-CM

## 2014-07-13 DIAGNOSIS — Z86010 Personal history of colonic polyps: Secondary | ICD-10-CM | POA: Diagnosis not present

## 2014-07-13 DIAGNOSIS — R1084 Generalized abdominal pain: Secondary | ICD-10-CM | POA: Insufficient documentation

## 2014-07-13 DIAGNOSIS — R109 Unspecified abdominal pain: Secondary | ICD-10-CM

## 2014-07-13 DIAGNOSIS — Z79899 Other long term (current) drug therapy: Secondary | ICD-10-CM | POA: Diagnosis not present

## 2014-07-13 DIAGNOSIS — I10 Essential (primary) hypertension: Secondary | ICD-10-CM | POA: Insufficient documentation

## 2014-07-13 DIAGNOSIS — E860 Dehydration: Secondary | ICD-10-CM | POA: Diagnosis not present

## 2014-07-13 LAB — CBC WITH DIFFERENTIAL/PLATELET
BASOS PCT: 1 % (ref 0–1)
Basophils Absolute: 0 10*3/uL (ref 0.0–0.1)
EOS ABS: 0 10*3/uL (ref 0.0–0.7)
Eosinophils Relative: 1 % (ref 0–5)
HCT: 43.1 % (ref 36.0–46.0)
HEMOGLOBIN: 14.6 g/dL (ref 12.0–15.0)
LYMPHS ABS: 0.7 10*3/uL (ref 0.7–4.0)
Lymphocytes Relative: 19 % (ref 12–46)
MCH: 31.7 pg (ref 26.0–34.0)
MCHC: 33.9 g/dL (ref 30.0–36.0)
MCV: 93.5 fL (ref 78.0–100.0)
MONO ABS: 0.4 10*3/uL (ref 0.1–1.0)
Monocytes Relative: 11 % (ref 3–12)
Neutro Abs: 2.7 10*3/uL (ref 1.7–7.7)
Neutrophils Relative %: 68 % (ref 43–77)
Platelets: 218 10*3/uL (ref 150–400)
RBC: 4.61 MIL/uL (ref 3.87–5.11)
RDW: 14.3 % (ref 11.5–15.5)
WBC: 3.9 10*3/uL — ABNORMAL LOW (ref 4.0–10.5)

## 2014-07-13 LAB — COMPREHENSIVE METABOLIC PANEL
ALBUMIN: 3.6 g/dL (ref 3.5–5.2)
ALT: 23 U/L (ref 0–35)
ANION GAP: 13 (ref 5–15)
AST: 33 U/L (ref 0–37)
Alkaline Phosphatase: 130 U/L — ABNORMAL HIGH (ref 39–117)
BILIRUBIN TOTAL: 0.5 mg/dL (ref 0.3–1.2)
BUN: 19 mg/dL (ref 6–23)
CO2: 25 mEq/L (ref 19–32)
CREATININE: 0.68 mg/dL (ref 0.50–1.10)
Calcium: 10 mg/dL (ref 8.4–10.5)
Chloride: 97 mEq/L (ref 96–112)
GFR calc non Af Amer: >90 mL/min (ref >90)
GLUCOSE: 132 mg/dL — AB (ref 70–99)
Potassium: 3.5 mEq/L — ABNORMAL LOW (ref 3.7–5.3)
Sodium: 135 mEq/L — ABNORMAL LOW (ref 137–147)
Total Protein: 9.7 g/dL — ABNORMAL HIGH (ref 6.0–8.3)

## 2014-07-13 LAB — LIPASE, BLOOD: LIPASE: 7 U/L — AB (ref 11–59)

## 2014-07-13 MED ORDER — MORPHINE SULFATE 4 MG/ML IJ SOLN
4.0000 mg | Freq: Once | INTRAMUSCULAR | Status: AC
  Administered 2014-07-13: 4 mg via INTRAVENOUS
  Filled 2014-07-13: qty 1

## 2014-07-13 MED ORDER — SODIUM CHLORIDE 0.9 % IV SOLN
1000.0000 mL | INTRAVENOUS | Status: DC
  Administered 2014-07-13: 1000 mL via INTRAVENOUS

## 2014-07-13 MED ORDER — MORPHINE SULFATE 4 MG/ML IJ SOLN: 4.0000 mg | Freq: Once | INTRAMUSCULAR | Status: DC

## 2014-07-13 MED ORDER — IOHEXOL 300 MG/ML  SOLN
100.0000 mL | Freq: Once | INTRAMUSCULAR | Status: AC | PRN
  Administered 2014-07-13: 100 mL via INTRAVENOUS

## 2014-07-13 MED ORDER — OXYCODONE HCL 5 MG PO TABS: ORAL_TABLET | ORAL | Status: DC

## 2014-07-13 MED ORDER — SODIUM CHLORIDE 0.9 % IV SOLN
1000.0000 mL | Freq: Once | INTRAVENOUS | Status: AC
Start: 2014-07-13 — End: 2014-07-13
  Administered 2014-07-13: 1000 mL via INTRAVENOUS

## 2014-07-13 MED ORDER — ONDANSETRON HCL 4 MG/2ML IJ SOLN
4.0000 mg | Freq: Once | INTRAMUSCULAR | Status: AC
  Administered 2014-07-13: 4 mg via INTRAVENOUS
  Filled 2014-07-13: qty 2

## 2014-07-13 NOTE — Discharge Instructions (Signed)
Drink plenty of fluids. You can increase your breakthrough pain meds from 5 mg to 10 mg. Use your zofran ODT for nausea or vomiting. Call Dr Marlowe Aschoff and Dr Gearldine Shown office on Monday to let them know about your ED visit. You can return to the ED if you feel worse again.  Take your miralax every day to prevent constipation.

## 2014-07-13 NOTE — ED Notes (Addendum)
Pt reports generalized abdominal pain for the past several days. Hx of pancreatic cancer, last chemo pill a month ago. Emesis started this am, has thrown up 3x today. No diarrhea or dysuria. Pt feels weak

## 2014-07-13 NOTE — ED Notes (Signed)
MD at bedside. 

## 2014-07-13 NOTE — ED Provider Notes (Signed)
CSN: [id number]     Arrival date & time 07/13/14  1227 History   First MD Initiated Contact with Patient 07/13/14 1457     Chief Complaint  Patient presents with  . Abdominal Pain  . Emesis     (Consider location/radiation/quality/duration/timing/severity/associated sxs/prior Treatment) HPI Patient reports she was diagnosed with pancreatic cancer in June. She has finished her radiation and oral chemotherapy treatments. She is scheduled to have surgery on November 17. She states all week she has been having worsening diffuse abdominal pain that radiates into her back. She states this is the pain she's had before but it is worse now. She states the pain radiates into her back. She has had nausea with one to 2 episodes of vomiting per day however she's vomited 3 times today. She denies diarrhea. She states she does have constipation in her bowel movements are hard and like balls. Her last BM was yesterday. She states she feels like her abdomen is bloated. She states her pain comes and goes and can last for many hours. The pain is described as sharp. She states eating makes the pain worse. She states laying on her side may help the pain a little bit. She did not take any of her pain medications yet.   PCP Dr Mont Dutton Surgeon Dr Barry Dienes Oncology Dr Benay Spice Radiation Oncologist Dr Sondra Come  Past Medical History  Diagnosis Date  . Arthritis   . Depression   . H/O: substance abuse   . Back pain   . Personal history of colonic polyp - adenoma 10/26/2013    10/26/2013 diminutive sigmoid polyp removed    . Pancreatitis, acute   . Hypertension   . Osteoporosis    Past Surgical History  Procedure Laterality Date  . Lumbar epidural injection    . Colonoscopy    . Ercp N/A 03/06/2014    Procedure: ENDOSCOPIC RETROGRADE CHOLANGIOPANCREATOGRAPHY (ERCP);  Surgeon: Gatha Mayer, MD;  Location: [facility] ENDOSCOPY;  Service: Endoscopy;  Laterality: N/A;  . Eus N/A 04/05/2014    Procedure: UPPER ENDOSCOPIC  ULTRASOUND (EUS) RADIAL;  Surgeon: [name], MD;  Location: [facility] ENDOSCOPY;  Service: Endoscopy;  Laterality: N/A;   Family History  Problem Relation Age of Onset  . Colon cancer Neg Hx   . Esophageal cancer Neg Hx   . Rectal cancer Neg Hx   . Stomach cancer Neg Hx   . CAD Mother   . Cancer Father   . Cirrhosis Sister     she is a heavy drinker.    History  Substance Use Topics  . Smoking status: Current Every Day Smoker -- 0.25 packs/day for 40 years    Types: Cigarettes  . Smokeless tobacco: Current User  . Alcohol Use: No     Comment: occasionally. 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     Allergies  Review of patient's allergies indicates no known allergies.  Home Medications   Prior to Admission medications   Medication Sig Start  Date End Date Taking? Authorizing Provider  amLODipine (NORVASC) 5 MG tablet Take 5 mg by mouth daily.   Yes Historical Provider, MD  CALCIUM-VITAMIN D PO Take 1 capsule by mouth daily.   Yes Historical Provider, MD  hyaluronate sodium (RADIAPLEXRX) GEL Apply 1 application topically 2 (two) times daily. 05/01/14  Yes Blair Promise, MD  ibuprofen (ADVIL,MOTRIN) 800 MG tablet Take 800 mg by mouth 2 (two) times daily as needed for mild pain or moderate pain.   Yes Historical Provider, MD  metoCLOPramide (REGLAN) 10 MG tablet Take 1 tablet (10 mg total) by mouth every 6 (six) hours. 03/18/14  Yes Mariea Clonts, MD  omeprazole (PRILOSEC) 20 MG capsule Take 40 mg by mouth daily.   Yes Historical Provider, MD  ondansetron (ZOFRAN-ODT) 4 MG disintegrating tablet Take 4 mg by mouth every  8 (eight) hours as needed for nausea or vomiting.   Yes Historical Provider, MD  oxyCODONE (OXY IR/ROXICODONE) 5 MG immediate release tablet Take 1 tablet (5 mg total) by mouth every 4 (four) hours as needed for severe pain. 07/03/14  Yes Ladell Pier, MD  OxyCODONE (OXYCONTIN) 10 mg T12A 12 hr tablet Take 1 tablet (10 mg total) by mouth every 12 (twelve) hours. 07/03/14  Yes Ladell Pier, MD  polyethylene glycol [facility] / GLYCOLAX) packet Take 17 g by mouth daily.   Yes Historical Provider, MD  psyllium (REGULOID) 0.52 G capsule Take 1 capsule (0.52 g total) by mouth daily. 05/23/14  Yes Ladell Pier, MD  sertraline (ZOLOFT) 100 MG tablet Take 100 mg by mouth every morning.    Yes Historical Provider, MD  sorbitol 70 % solution Take 15 mLs by mouth 2 (two) times daily. Increase to 30 ml twice a day for constipation 05/23/14   Ladell Pier, MD   BP 129/77  Pulse 64  Temp(Src) 98.1 F (36.7 C) (Oral)  Resp 20  SpO2 98%  Vital signs normal   Physical Exam  Nursing note and vitals reviewed. Constitutional: She is oriented to person, place, and time. She appears well-developed and well-nourished.  Non-toxic appearance. She does not appear ill. No distress.  HENT:  Head: Normocephalic and atraumatic.  Right Ear: External ear normal.  Left Ear: External ear normal.  Nose: Nose normal. No mucosal edema or rhinorrhea.  Mouth/Throat: Oropharynx is clear and moist and mucous membranes are normal. No dental abscesses or uvula swelling.  Eyes: Conjunctivae and EOM are normal. Pupils are equal, round, and reactive to light.  Neck: Normal range of motion and full passive range of motion without pain. Neck supple.  Cardiovascular: Normal rate, regular rhythm and normal heart sounds.  Exam reveals no gallop and no friction rub.   No murmur heard. Pulmonary/Chest: Effort normal and breath sounds normal. No respiratory distress. She has no wheezes. She has no rhonchi. She has no rales. She  exhibits no tenderness and no crepitus.  Abdominal: Soft. Normal appearance and bowel sounds are normal. She exhibits no distension. There is generalized tenderness. There is no rigidity, no rebound and no guarding.  Musculoskeletal: Normal range of motion. She exhibits no edema and no tenderness.  Moves all extremities well.   Neurological: She is alert and oriented to person, place, and time. She has normal strength. No cranial nerve deficit.  Skin: Skin is warm, dry and intact. No rash noted. No erythema. No pallor.  Psychiatric: She has a normal mood and affect. Her speech is normal and behavior is normal.  Her mood appears not anxious.    ED Course  Procedures (including critical care time)  Medications  0.9 %  sodium chloride infusion (0 mLs Intravenous Stopped 07/13/14 1954)    Followed by  0.9 %  sodium chloride infusion (1,000 mLs Intravenous New Bag/Given 07/13/14 1705)  morphine 4 MG/ML injection 4 mg (not administered)  morphine 4 MG/ML injection 4 mg (not administered)  morphine 4 MG/ML injection 4 mg (4 mg Intravenous Given 07/13/14 1609)  ondansetron (ZOFRAN) injection 4 mg (4 mg Intravenous Given 07/13/14 1609)  iohexol (OMNIPAQUE) 300 MG/ML solution 100 mL (100 mLs Intravenous Contrast Given 07/13/14 1922)  morphine 4 MG/ML injection 4 mg (4 mg Intravenous Given 07/13/14 1954)    20:16 Dr Marlou Starks, states patient may not be a surgical candidate now with the changes on her CT scan, suggests having Dr Barry Dienes review the scan next week. Also suggests talking to oncology.   20:28 Dr Alen Blew, states there wouldn't be anything done over the weekend specifically related to her cancer, however if she feels better with IV fluids and pain meds she could be discharged and if not can be  admitted for intractable pain.   20:55 Pt is smiling, feeling better. Given her test results. Pt states she feels like she can go home, we discussed she can increase her breakthrough pain meds from 5 mg to  10 mg.  She is going to f/u with Dr Barry Dienes and Dr Benay Spice this week.   Labs Review Results for orders placed during the hospital encounter of 07/13/14  CBC WITH DIFFERENTIAL      Result Value Ref Range   WBC 3.9 (*) 4.0 - 10.5 K/uL   RBC 4.61  3.87 - 5.11 MIL/uL   Hemoglobin 14.6  12.0 - 15.0 g/dL   HCT 43.1  36.0 - 46.0 %   MCV 93.5  78.0 - 100.0 fL   MCH 31.7  26.0 - 34.0 pg   MCHC 33.9  30.0 - 36.0 g/dL   RDW 14.3  11.5 - 15.5 %   Platelets 218  150 - 400 K/uL   Neutrophils Relative % 68  43 - 77 %   Neutro Abs 2.7  1.7 - 7.7 K/uL   Lymphocytes Relative 19  12 - 46 %   Lymphs Abs 0.7  0.7 - 4.0 K/uL   Monocytes Relative 11  3 - 12 %   Monocytes Absolute 0.4  0.1 - 1.0 K/uL   Eosinophils Relative 1  0 - 5 %   Eosinophils Absolute 0.0  0.0 - 0.7 K/uL   Basophils Relative 1  0 - 1 %   Basophils Absolute 0.0  0.0 - 0.1 K/uL  COMPREHENSIVE METABOLIC PANEL      Result Value Ref Range   Sodium 135 (*) 137 - 147 mEq/L   Potassium 3.5 (*) 3.7 - 5.3 mEq/L   Chloride 97  96 - 112 mEq/L   CO2 25  19 - 32 mEq/L   Glucose, Bld 132 (*) 70 - 99 mg/dL   BUN 19  6 - 23 mg/dL   Creatinine, Ser 0.68  0.50 - 1.10 mg/dL   Calcium 10.0  8.4 - 10.5 mg/dL   Total Protein 9.7 (*) 6.0 - 8.3 g/dL   Albumin 3.6  3.5 - 5.2 g/dL   AST 33  0 - 37 U/L   ALT 23  0 - 35 U/L   Alkaline Phosphatase 130 (*) 39 - 117 U/L   Total Bilirubin 0.5  0.3 - 1.2 mg/dL   GFR calc non Af Amer >90  >90 mL/min   GFR calc Af Amer >90  >90 mL/min   Anion gap 13  5 - 15  LIPASE, BLOOD      Result Value Ref Range   Lipase 7 (*) 11 - 59 U/L   Laboratory interpretation all normal except mild hyponatremia, hypokalemia    Imaging Review Ct Abdomen Pelvis W Contrast  07/13/2014   CLINICAL DATA:  Right upper quadrant pain x1 week, nausea/ vomiting/constipation, history of pancreatic cancer with chemotherapy complete in September 2015  EXAM: CT ABDOMEN AND PELVIS WITH CONTRAST  TECHNIQUE: Multidetector CT imaging of the  abdomen and pelvis was performed using the standard protocol following bolus administration of intravenous contrast.  CONTRAST:  172mL OMNIPAQUE IOHEXOL 300 MG/ML  SOLN  COMPARISON:  MRI abdomen dated 03/05/2014. CT abdomen pelvis dated 03/04/2014.  FINDINGS: Lower chest: 5 mm left lower lobe pulmonary nodule (series 6/image 1).  Hepatobiliary: Liver is within normal limits. No suspicious/enhancing hepatic lesions.  Gallbladder is notable for mild pericholecystic fluid the otherwise within normal limits. No intrahepatic ductal dilatation. Mild pneumobilia with indwelling biliary stent.  Pancreas: Pancreas is notable for an ill-defined 2.2 x 3.3 cm mass in the pancreatic head/uncinate process (series 2/ image 23). Associated dilatation of the main pancreatic duct.  Surrounding ill-defined soft tissue may reflect nodal spread in the porta hepatis (series 2/ image 24), although some of this appearance may reflect radiation changes. Additional 7 mm short axis gastrohepatic ligament node (series 2/image 14).  Soft tissue infiltration extends to the celiac axis (series 2/image 19) and tumor likely abuts/involves the SMV and undersurface of the portal vein (series 2/image 22).  Spleen: Within normal limits.  Adrenals/Urinary Tract: Adrenal glands are unremarkable.  Kidneys are within normal limits.  No hydronephrosis.  Bladder is within normal limits.  Stomach/Bowel: Stomach is unremarkable.  No evidence of bowel obstruction.  Normal appendix.  Moderate colonic stool burden.  Vascular/Lymphatic: No evidence of abdominal aortic aneurysm.  Nodes in the porta hepatic/gastrohepatic ligament, as above. No suspicious retroperitoneal/ pelvic lymphadenopathy.  Reproductive: Retroverted uterus.  No adnexal masses.  Other: No abdominopelvic ascites.  No gross peritoneal disease.  Musculoskeletal: Mild degenerative changes of the lower lumbar spine, most prominent at L5-S1.  IMPRESSION: Ill-defined 3.3 cm mass in the pancreatic  head/uncinate process, corresponding to known pancreatic adenocarcinoma.  Suspected vascular involvement and adjacent nodal disease, as above. Indwelling biliary stent.  5 mm left lower lobe pulmonary nodule, metastasis not excluded.   Electronically Signed   By: Julian Hy M.D.   On: 07/13/2014 19:58   Dg Abd Acute W/chest  07/13/2014   CLINICAL DATA:  Abdominal pain and history of pancreatic carcinoma.  EXAM: ACUTE ABDOMEN SERIES (ABDOMEN 2 VIEW & CHEST 1 VIEW)  COMPARISON:  Abdominal films on 03/18/2014.  FINDINGS: There is no evidence of pulmonary edema, consolidation, pneumothorax, nodule or pleural fluid. The heart size is normal.  Common bile duct endoscopic stent may have migrated slightly distally. Positioning is difficult to accurately determine by x-ray. There is no evidence of bowel obstruction. Stable moderate fecal material throughout the colon without evidence of fecal impaction. No free air identified. No small bowel dilatation. Stable degenerative spondylosis in the lower lumbar spine.  IMPRESSION: There may be some migration of a common bile duct stent.   Electronically Signed   By: Aletta Edouard M.D.   On: 07/13/2014 16:48     EKG Interpretation None  MDM   Final diagnoses:  Abdominal pain  Pancreatic cancer  Constipation  Nausea and vomiting, vomiting of unspecified type  Dehydration     New Prescriptions   OXYCODONE (ROXICODONE) 5 MG IMMEDIATE RELEASE TABLET    Take 1 or 2 po Q 6hrs for pain    Plan discharge  Rolland Porter, MD, Alanson Aly, MD 07/13/14 2103

## 2014-07-13 NOTE — Telephone Encounter (Signed)
Contacted patient to schedule nutrition followup. Patient reports she doesn't feel well and is on her way to the hospital. Patient reports her pancreas surgery is scheduled for November 17. Told patient I would contact her after she was feeling better to schedule nutrition followup and education regarding Impact [employer] nutrition study.

## 2014-07-13 NOTE — ED Notes (Signed)
Patient takes zofran at home and has not had any emesis since then.

## 2014-07-13 NOTE — ED Notes (Signed)
Patient transported to X-ray 

## 2014-07-16 ENCOUNTER — Telehealth: Payer: Self-pay | Admitting: Oncology

## 2014-07-16 ENCOUNTER — Telehealth: Payer: Self-pay | Admitting: Nurse Practitioner

## 2014-07-16 ENCOUNTER — Telehealth: Payer: Self-pay | Admitting: *Deleted

## 2014-07-16 NOTE — Telephone Encounter (Signed)
Per staff msg Nut/BN scheduled on 11/05 after MD visit..... Melinda Conrad

## 2014-07-16 NOTE — Telephone Encounter (Signed)
Carrera left a message stating that she went to the ER on Friday.  She wanted to let Dr. Benay Spice know that her mass had grown 1 cm on the scan she had done.  She asked for a call back at her daughter's house and left a number: [phone].  Left a message for Dr. Gearldine Shown nurse.

## 2014-07-16 NOTE — Telephone Encounter (Signed)
Wanted MD aware that she was in the ER over weekend and had CT scan done-was told her cancer is getting larger. Nausea is under control and her pain is controlled with Oxycontin 10 mg bid and taking oxyir 5 mg: 5-6 tablets/day.  Asking for MD to review her scan. Does she need to be seen sooner than 07/26/14?

## 2014-07-17 ENCOUNTER — Telehealth: Payer: Self-pay | Admitting: *Deleted

## 2014-07-17 NOTE — Telephone Encounter (Signed)
Called patient to let her know that Dr. Barry Dienes needs her to go forward with scans tomorrow as scheduled.

## 2014-07-17 NOTE — Telephone Encounter (Signed)
Message copied by Tania Ade on Tue Jul 17, 2014 12:22 PM ------      Message from: Stark Klein      Created: Mon Jul 16, 2014  2:46 PM      Regarding: RE: CT scan       I would like her to get the chest with iv contrast, and the ct abdomen w and without contrast, pancreatic protocol.      tx      FB      ----- Message -----         From: Tania Ade, RN         Sent: 07/16/2014   2:43 PM           To: Stark Klein, MD      Subject: CT scan                                                  She is scheduled for CT C/A/P on 10/28.      Was in ED on 10/23 pm with CT A/P.      Do you want her to go ahead with the chest?            Thanks,      Merceda Elks, RN [place]       ------

## 2014-07-18 ENCOUNTER — Ambulatory Visit
Admission: RE | Admit: 2014-07-18 | Discharge: 2014-07-18 | Disposition: A | Discharge status: Home / Self Care | Payer: Medicaid Other | Source: Ambulatory Visit | Attending: General Surgery | Admitting: General Surgery

## 2014-07-18 DIAGNOSIS — C250 Malignant neoplasm of head of pancreas: Secondary | ICD-10-CM

## 2014-07-18 MED ORDER — IOHEXOL 300 MG/ML  SOLN
100.0000 mL | Freq: Once | INTRAMUSCULAR | Status: AC | PRN
  Administered 2014-07-18: 100 mL via INTRAVENOUS

## 2014-07-25 ENCOUNTER — Encounter: Payer: Self-pay | Admitting: Oncology

## 2014-07-26 ENCOUNTER — Encounter: Payer: Self-pay | Admitting: Radiation Oncology

## 2014-07-26 ENCOUNTER — Ambulatory Visit: Payer: Medicaid Other | Admitting: Nurse Practitioner

## 2014-07-26 ENCOUNTER — Ambulatory Visit
Admission: RE | Admit: 2014-07-26 | Discharge: 2014-07-26 | Disposition: A | Discharge status: Home / Self Care | Payer: Medicaid Other | Source: Ambulatory Visit | Attending: Radiation Oncology | Admitting: Radiation Oncology

## 2014-07-26 ENCOUNTER — Ambulatory Visit: Payer: Medicaid Other | Admitting: Nutrition

## 2014-07-26 ENCOUNTER — Other Ambulatory Visit: Payer: Self-pay

## 2014-07-26 ENCOUNTER — Telehealth: Payer: Self-pay | Admitting: Nurse Practitioner

## 2014-07-26 ENCOUNTER — Encounter: Payer: Medicaid Other | Admitting: Nutrition

## 2014-07-26 VITALS — BP 183/91 | HR 65 | Temp 97.8°F | Resp 20 | Ht 63.0 in | Wt 122.1 lb

## 2014-07-26 DIAGNOSIS — C259 Malignant neoplasm of pancreas, unspecified: Secondary | ICD-10-CM

## 2014-07-26 MED ORDER — OXYCODONE HCL ER 10 MG PO T12A: 10.0000 mg | EXTENDED_RELEASE_TABLET | Freq: Two times a day (BID) | ORAL | Status: DC

## 2014-07-26 MED ORDER — OXYCODONE HCL 5 MG PO TABS: ORAL_TABLET | ORAL | Status: DC

## 2014-07-26 NOTE — Telephone Encounter (Signed)
Home phone no vm, lft msg on cell phone, ML/Nut r/s from 11/05 to 11/12 and that I would mail out an updated copy to pt..... KJ

## 2014-07-26 NOTE — Progress Notes (Addendum)
Melinda Conrad here for follow up after treatment for pancreatic cancer.  She reports pain in her abdomen and middle back at a 7/10.  She is taking OxyContin 10 mg twice a day and oxycodone 5 mg, 2 tablets q 6 hours as needed in between.  She needs refills on both pain medications.  She denies nausea.  She has lost 3 lbs since 06/22/14.  She reports that her constipation has resolved and had 3 bowel movements yesterday.  She reports fatigue.  Her skin is intact in the treatment area.  She is scheduled for surgery on 11/17.

## 2014-07-26 NOTE — Progress Notes (Signed)
Nutrition followup completed with patient and daughter.  Patient complains of being in increased pain.  She has pain medication.   She reports it is difficult to eat.  Weight has decreased to 122 pounds.  Patient is scheduled for Whipple surgery on November 17.  Patient is interested and agreeable to consuming Impact [employer] for Nutrition trial.    Nutrition diagnosis: Unintended weight loss continues.  Intervention: Patient educated to continue high-calorie, high-protein foods to minimize further weight loss.   Patient educated on impact AR nutrition supplements. Patient was educated on supplement usage. Patient was given one complementary case of impact AR.   She was also given brochure with calendar.   Educated to consume one sample 3 times a day 5 days prior to surgery.  Consent form signed. All questions were answered.  Teach back method used.  Patient has my contact information.  Monitoring, evaluation, goals: Patient will tolerate oral nutrition supplements prior to surgery.  Next visit: Thursday, November 12.  **Disclaimer: This note was dictated with voice recognition software. Similar sounding words can inadvertently be transcribed and this note may contain transcription errors which may not have been corrected upon publication of note.**

## 2014-07-26 NOTE — Progress Notes (Signed)
Radiation Oncology         [phone] ________________________________  Name: Melinda Conrad MRN: [id number]  Date: 07/26/2014  DOB: 08-24-1951  Follow-Up Visit Note  CC: DEAN, ERIC, MD  Ladell Pier, MD    ICD-9-CM ICD-10-CM   1. Malignant neoplasm of pancreas, unspecified location of malignancy 157.9 C25.9 OxyCODONE (OXYCONTIN) 10 mg T12A 12 hr tablet    Diagnosis:   Pancreatic cancer   Staging form: Pancreas, AJCC 7th Edition     Clinical: Stage IIA (T3, N0, M0) - Signed by Ladell Pier, MD on 04/12/2014   Interval Since Last Radiation:  7  weeks  Narrative:  The patient returns today for routine follow-up.  She continues to have pain in the upper abdomen and back area. Patient's problems with constipation have resolved as of yesterday. Her appetite is improving.  Energy level is improving                            ALLERGIES:  has No Known Allergies.  Meds: Current Outpatient Prescriptions  Medication Sig Dispense Refill  . amLODipine (NORVASC) 5 MG tablet Take 5 mg by mouth daily.    Marland Kitchen CALCIUM-VITAMIN D PO Take 1 capsule by mouth daily.    . hyaluronate sodium (RADIAPLEXRX) GEL Apply 1 application topically 2 (two) times daily.    Marland Kitchen ibuprofen (ADVIL,MOTRIN) 800 MG tablet Take 800 mg by mouth 2 (two) times daily as needed for mild pain or moderate pain.    Marland Kitchen metoCLOPramide (REGLAN) 10 MG tablet Take 1 tablet (10 mg total) by mouth every 6 (six) hours. 10 tablet 0  . omeprazole (PRILOSEC) 20 MG capsule Take 40 mg by mouth daily.    . ondansetron (ZOFRAN-ODT) 4 MG disintegrating tablet Take 4 mg by mouth every 8 (eight) hours as needed for nausea or vomiting.    Marland Kitchen oxyCODONE (OXY IR/ROXICODONE) 5 MG immediate release tablet Take 1 tablet (5 mg total) by mouth every 4 (four) hours as needed for severe pain. 60 tablet 0  . OxyCODONE (OXYCONTIN) 10 mg T12A 12 hr tablet Take 1 tablet (10 mg total) by mouth every 12 (twelve) hours. 42 tablet 0  . polyethylene glycol  (MIRALAX / GLYCOLAX) packet Take 17 g by mouth daily.    . psyllium (REGULOID) 0.52 G capsule Take 1 capsule (0.52 g total) by mouth daily. 90 capsule 3  . sertraline (ZOLOFT) 100 MG tablet Take 100 mg by mouth every morning.     . sorbitol 70 % solution Take 15 mLs by mouth 2 (two) times daily. Increase to 30 ml twice a day for constipation 473 mL 1  . oxyCODONE (ROXICODONE) 5 MG immediate release tablet Take 1 or 2 po Q 6hrs for pain 30 tablet 0   No current facility-administered medications for this encounter.    Physical Findings: The patient is in no acute distress. Patient is alert and oriented.  height is 5\' 3"  (1.6 m) and weight is 122 lb 1.6 oz (55.384 kg). Her oral temperature is 97.8 F (36.6 C). Her blood pressure is 183/91 and her pulse is 65. Her respiration is 20. .  The lungs are clear. The heart has a regular rhythm and rate. The abdomen is soft and nontender with normal bowel sounds.  Lab Findings: Lab Results  Component Value Date   WBC 3.9* 07/13/2014   HGB 14.6 07/13/2014   HCT 43.1 07/13/2014   MCV 93.5 07/13/2014  PLT 218 07/13/2014    Radiographic Findings: Ct Abdomen Pelvis W Wo Contrast  07/18/2014   CLINICAL DATA:  Status post chemotherapy and radiation therapy for pancreatic carcinoma. Occasional cough. Smoker. Mid abdominal and back pain. Constipation and 50 lb weight loss.  EXAM: CT CHEST WITH CONTRAST  CT ABDOMEN AND PELVIS WITH AND WITHOUT CONTRAST  TECHNIQUE: Multidetector CT imaging of the chest was performed during intravenous contrast administration. Multidetector CT imaging of the abdomen and pelvis was performed following the standard protocol before and during bolus administration of intravenous contrast.  CONTRAST:  157mL OMNIPAQUE IOHEXOL 300 MG/ML  SOLN  COMPARISON:  07/13/2014 abdominal pelvic CT. Chest radiograph 07/13/2014. No prior chest CT.  FINDINGS: CT CHEST FINDINGS  Lungs/Pleura: Mild centrilobular and moderate paraseptal emphysema.   Similar 5 mm left lower lobe lung nodule on image 45 of series 12. No pleural fluid.  Heart/Mediastinum: No supraclavicular adenopathy. Aortic and branch vessel atherosclerosis. Heart size upper normal. LAD coronary artery atherosclerosis on image 36 series 10. No central pulmonary embolism, on this non-dedicated study. No mediastinal or hilar adenopathy.  CT ABDOMEN AND PELVIS FINDINGS  Hepatobiliary: No evidence of liver metastasis. Patent hepatic and intrahepatic portal veins. Mild intrahepatic biliary ductal dilatation. Gallbladder hype enhancement, similar nonspecific. Common duct stent originates in the porta hepatis (below the mild intrahepatic ductal dilatation) and terminates in the third portion of the duodenum.  Pancreatic atrophy and upstream ductal dilatation again identified. Pancreatic head/ uncinate process mass is again identified. Somewhat in ill-defined. Measures on the order of 2.7 x 2.1 cm on image 133 of series 8. Likely similar to on the recent prior exam, but better visualized today.  Soft tissue density extends adjacent to the right side of the celiac origin on image 48 of series 5. This could represent direct extension from the adjacent lesion.  Soft tissue density in the porta hepatis adjacent the right side of the celiac origin is favored to be radiation induced. Tumor is identified posterior to the superior mesenteric vein at approximately the 7 o'clock position on image 133 of series 8. No vascular narrowing or well-defined encasement. Splenic vein is normal. Portal vein traverses the porta hepatis within the area of soft tissue infiltration detailed below. No well-defined mass or adenopathy in this area.  Pancreas: Normal, without mass or pancreatic ductal dilatation.  Spleen: Normal  Urinary tract: Normal adrenal glands. Normal kidneys, without hydronephrosis. Normal urinary bladder.  Stomach/Bowel: Normal stomach and proximal duodenum. Colonic stool burden suggests constipation.  Scattered colonic diverticula. Normal caliber of small bowel loops.  Vascular/Lymphatic: Please see discussion above of celiac and superior mesenteric artery. Soft tissue density in the porta hepatis including on image 123 of series 8. Favored to be radiation induced. No well-defined peripancreatic adenopathy. No pelvic adenopathy.  Reproductive: Atrophic uterus.  No adnexal mass.  Other: No significant free fluid. No evidence of omental or peritoneal disease.  Musculoskeletal: Extensive lumbar facet arthropathy. Degenerate disc disease at the lumbosacral junction. Grade 1 L4-5 anterolisthesis with a disc bulge at this level.  IMPRESSION: CT CHEST IMPRESSION  1. Redemonstration of a left lower lobe pulmonary nodule, nonspecific. 2. Otherwise, no evidence of metastatic disease within the chest. 3. Centrilobular and paraseptal emphysema.  CT ABDOMEN AND PELVIS IMPRESSION  1. Ill-defined mass within the pancreatic head and uncinate process. Felt to be similar in size to on the prior exam and back to 03/04/2014. 2. Tumor positioned immediately posterior to the superior mesenteric vein, without well-defined encasement. Similarly, minimal soft  tissue density is seen adjacent to the superior mesenteric artery. No definitive surgical exclusion criteria identified. 3. Common duct stent in place, originating in the porta hepatis. Mild intrahepatic ductal dilatation more cephalad. 4. Amorphous soft tissue density in the porta hepatis which is favored to be radiation induced. No well-defined adenopathy. 5.  Possible constipation.   Electronically Signed   By: Abigail Miyamoto M.D.   On: 07/18/2014 18:57   Ct Chest W Contrast  07/18/2014   CLINICAL DATA:  Status post chemotherapy and radiation therapy for pancreatic carcinoma. Occasional cough. Smoker. Mid abdominal and back pain. Constipation and 50 lb weight loss.  EXAM: CT CHEST WITH CONTRAST  CT ABDOMEN AND PELVIS WITH AND WITHOUT CONTRAST  TECHNIQUE: Multidetector CT  imaging of the chest was performed during intravenous contrast administration. Multidetector CT imaging of the abdomen and pelvis was performed following the standard protocol before and during bolus administration of intravenous contrast.  CONTRAST:  117mL OMNIPAQUE IOHEXOL 300 MG/ML  SOLN  COMPARISON:  07/13/2014 abdominal pelvic CT. Chest radiograph 07/13/2014. No prior chest CT.  FINDINGS: CT CHEST FINDINGS  Lungs/Pleura: Mild centrilobular and moderate paraseptal emphysema.  Similar 5 mm left lower lobe lung nodule on image 45 of series 12. No pleural fluid.  Heart/Mediastinum: No supraclavicular adenopathy. Aortic and branch vessel atherosclerosis. Heart size upper normal. LAD coronary artery atherosclerosis on image 36 series 10. No central pulmonary embolism, on this non-dedicated study. No mediastinal or hilar adenopathy.  CT ABDOMEN AND PELVIS FINDINGS  Hepatobiliary: No evidence of liver metastasis. Patent hepatic and intrahepatic portal veins. Mild intrahepatic biliary ductal dilatation. Gallbladder hype enhancement, similar nonspecific. Common duct stent originates in the porta hepatis (below the mild intrahepatic ductal dilatation) and terminates in the third portion of the duodenum.  Pancreatic atrophy and upstream ductal dilatation again identified. Pancreatic head/ uncinate process mass is again identified. Somewhat in ill-defined. Measures on the order of 2.7 x 2.1 cm on image 133 of series 8. Likely similar to on the recent prior exam, but better visualized today.  Soft tissue density extends adjacent to the right side of the celiac origin on image 48 of series 5. This could represent direct extension from the adjacent lesion.  Soft tissue density in the porta hepatis adjacent the right side of the celiac origin is favored to be radiation induced. Tumor is identified posterior to the superior mesenteric vein at approximately the 7 o'clock position on image 133 of series 8. No vascular narrowing or  well-defined encasement. Splenic vein is normal. Portal vein traverses the porta hepatis within the area of soft tissue infiltration detailed below. No well-defined mass or adenopathy in this area.  Pancreas: Normal, without mass or pancreatic ductal dilatation.  Spleen: Normal  Urinary tract: Normal adrenal glands. Normal kidneys, without hydronephrosis. Normal urinary bladder.  Stomach/Bowel: Normal stomach and proximal duodenum. Colonic stool burden suggests constipation. Scattered colonic diverticula. Normal caliber of small bowel loops.  Vascular/Lymphatic: Please see discussion above of celiac and superior mesenteric artery. Soft tissue density in the porta hepatis including on image 123 of series 8. Favored to be radiation induced. No well-defined peripancreatic adenopathy. No pelvic adenopathy.  Reproductive: Atrophic uterus.  No adnexal mass.  Other: No significant free fluid. No evidence of omental or peritoneal disease.  Musculoskeletal: Extensive lumbar facet arthropathy. Degenerate disc disease at the lumbosacral junction. Grade 1 L4-5 anterolisthesis with a disc bulge at this level.  IMPRESSION: CT CHEST IMPRESSION  1. Redemonstration of a left lower lobe pulmonary nodule,  nonspecific. 2. Otherwise, no evidence of metastatic disease within the chest. 3. Centrilobular and paraseptal emphysema.  CT ABDOMEN AND PELVIS IMPRESSION  1. Ill-defined mass within the pancreatic head and uncinate process. Felt to be similar in size to on the prior exam and back to 03/04/2014. 2. Tumor positioned immediately posterior to the superior mesenteric vein, without well-defined encasement. Similarly, minimal soft tissue density is seen adjacent to the superior mesenteric artery. No definitive surgical exclusion criteria identified. 3. Common duct stent in place, originating in the porta hepatis. Mild intrahepatic ductal dilatation more cephalad. 4. Amorphous soft tissue density in the porta hepatis which is favored to be  radiation induced. No well-defined adenopathy. 5.  Possible constipation.   Electronically Signed   By: Abigail Miyamoto M.D.   On: 07/18/2014 18:57   Ct Abdomen Pelvis W Contrast  07/13/2014   CLINICAL DATA:  Right upper quadrant pain x1 week, nausea/ vomiting/constipation, history of pancreatic cancer with chemotherapy complete in September 2015  EXAM: CT ABDOMEN AND PELVIS WITH CONTRAST  TECHNIQUE: Multidetector CT imaging of the abdomen and pelvis was performed using the standard protocol following bolus administration of intravenous contrast.  CONTRAST:  181mL OMNIPAQUE IOHEXOL 300 MG/ML  SOLN  COMPARISON:  MRI abdomen dated 03/05/2014. CT abdomen pelvis dated 03/04/2014.  FINDINGS: Lower chest: 5 mm left lower lobe pulmonary nodule (series 6/image 1).  Hepatobiliary: Liver is within normal limits. No suspicious/enhancing hepatic lesions.  Gallbladder is notable for mild pericholecystic fluid the otherwise within normal limits. No intrahepatic ductal dilatation. Mild pneumobilia with indwelling biliary stent.  Pancreas: Pancreas is notable for an ill-defined 2.2 x 3.3 cm mass in the pancreatic head/uncinate process (series 2/ image 23). Associated dilatation of the main pancreatic duct.  Surrounding ill-defined soft tissue may reflect nodal spread in the porta hepatis (series 2/ image 24), although some of this appearance may reflect radiation changes. Additional 7 mm short axis gastrohepatic ligament node (series 2/image 14).  Soft tissue infiltration extends to the celiac axis (series 2/image 19) and tumor likely abuts/involves the SMV and undersurface of the portal vein (series 2/image 22).  Spleen: Within normal limits.  Adrenals/Urinary Tract: Adrenal glands are unremarkable.  Kidneys are within normal limits.  No hydronephrosis.  Bladder is within normal limits.  Stomach/Bowel: Stomach is unremarkable.  No evidence of bowel obstruction.  Normal appendix.  Moderate colonic stool burden.   Vascular/Lymphatic: No evidence of abdominal aortic aneurysm.  Nodes in the porta hepatic/gastrohepatic ligament, as above. No suspicious retroperitoneal/ pelvic lymphadenopathy.  Reproductive: Retroverted uterus.  No adnexal masses.  Other: No abdominopelvic ascites.  No gross peritoneal disease.  Musculoskeletal: Mild degenerative changes of the lower lumbar spine, most prominent at L5-S1.  IMPRESSION: Ill-defined 3.3 cm mass in the pancreatic head/uncinate process, corresponding to known pancreatic adenocarcinoma.  Suspected vascular involvement and adjacent nodal disease, as above. Indwelling biliary stent.  5 mm left lower lobe pulmonary nodule, metastasis not excluded.   Electronically Signed   By: Julian Hy M.D.   On: 07/13/2014 19:58   Dg Abd Acute W/chest  07/13/2014   CLINICAL DATA:  Abdominal pain and history of pancreatic carcinoma.  EXAM: ACUTE ABDOMEN SERIES (ABDOMEN 2 VIEW & CHEST 1 VIEW)  COMPARISON:  Abdominal films on 03/18/2014.  FINDINGS: There is no evidence of pulmonary edema, consolidation, pneumothorax, nodule or pleural fluid. The heart size is normal.  Common bile duct endoscopic stent may have migrated slightly distally. Positioning is difficult to accurately determine by x-ray. There is no  evidence of bowel obstruction. Stable moderate fecal material throughout the colon without evidence of fecal impaction. No free air identified. No small bowel dilatation. Stable degenerative spondylosis in the lower lumbar spine.  IMPRESSION: There may be some migration of a common bile duct stent.   Electronically Signed   By: Aletta Edouard M.D.   On: 07/13/2014 16:48    Impression:  The patient is recovering from the effects of radiation.    Plan:  The patient is scheduled for surgery on November 17. Today I refilled the patient's oxycodone and OxyContin prescriptions.   ____________________________________ Blair Promise, MD

## 2014-07-29 ENCOUNTER — Other Ambulatory Visit: Payer: Self-pay | Admitting: Oncology

## 2014-07-30 ENCOUNTER — Encounter (HOSPITAL_COMMUNITY): Payer: Self-pay | Admitting: Emergency Medicine

## 2014-07-30 ENCOUNTER — Emergency Department (HOSPITAL_COMMUNITY)
Admission: EM | Admit: 2014-07-30 | Discharge: 2014-07-31 | Disposition: A | Discharge status: Home / Self Care | Payer: Medicaid Other | Attending: Emergency Medicine | Admitting: Emergency Medicine

## 2014-07-30 DIAGNOSIS — Z86010 Personal history of colonic polyps: Secondary | ICD-10-CM | POA: Diagnosis not present

## 2014-07-30 DIAGNOSIS — R112 Nausea with vomiting, unspecified: Secondary | ICD-10-CM | POA: Insufficient documentation

## 2014-07-30 DIAGNOSIS — Z8719 Personal history of other diseases of the digestive system: Secondary | ICD-10-CM | POA: Diagnosis not present

## 2014-07-30 DIAGNOSIS — R140 Abdominal distension (gaseous): Secondary | ICD-10-CM | POA: Diagnosis not present

## 2014-07-30 DIAGNOSIS — M1990 Unspecified osteoarthritis, unspecified site: Secondary | ICD-10-CM | POA: Insufficient documentation

## 2014-07-30 DIAGNOSIS — R1012 Left upper quadrant pain: Secondary | ICD-10-CM | POA: Insufficient documentation

## 2014-07-30 DIAGNOSIS — I10 Essential (primary) hypertension: Secondary | ICD-10-CM | POA: Insufficient documentation

## 2014-07-30 DIAGNOSIS — Z79899 Other long term (current) drug therapy: Secondary | ICD-10-CM | POA: Insufficient documentation

## 2014-07-30 DIAGNOSIS — C259 Malignant neoplasm of pancreas, unspecified: Secondary | ICD-10-CM | POA: Diagnosis not present

## 2014-07-30 DIAGNOSIS — R1013 Epigastric pain: Secondary | ICD-10-CM | POA: Diagnosis not present

## 2014-07-30 DIAGNOSIS — R52 Pain, unspecified: Secondary | ICD-10-CM

## 2014-07-30 DIAGNOSIS — F329 Major depressive disorder, single episode, unspecified: Secondary | ICD-10-CM | POA: Insufficient documentation

## 2014-07-30 DIAGNOSIS — Z720 Tobacco use: Secondary | ICD-10-CM | POA: Diagnosis not present

## 2014-07-30 DIAGNOSIS — M810 Age-related osteoporosis without current pathological fracture: Secondary | ICD-10-CM | POA: Insufficient documentation

## 2014-07-30 DIAGNOSIS — R1011 Right upper quadrant pain: Secondary | ICD-10-CM | POA: Diagnosis not present

## 2014-07-30 LAB — CBC WITH DIFFERENTIAL/PLATELET
BASOS PCT: 0 % (ref 0–1)
Basophils Absolute: 0 10*3/uL (ref 0.0–0.1)
Eosinophils Absolute: 0.1 10*3/uL (ref 0.0–0.7)
Eosinophils Relative: 3 % (ref 0–5)
HEMATOCRIT: 37.1 % (ref 36.0–46.0)
Hemoglobin: 12.5 g/dL (ref 12.0–15.0)
Lymphocytes Relative: 26 % (ref 12–46)
Lymphs Abs: 0.9 10*3/uL (ref 0.7–4.0)
MCH: 30.7 pg (ref 26.0–34.0)
MCHC: 33.7 g/dL (ref 30.0–36.0)
MCV: 91.2 fL (ref 78.0–100.0)
MONO ABS: 0.3 10*3/uL (ref 0.1–1.0)
MONOS PCT: 10 % (ref 3–12)
NEUTROS ABS: 2 10*3/uL (ref 1.7–7.7)
Neutrophils Relative %: 61 % (ref 43–77)
Platelets: 238 10*3/uL (ref 150–400)
RBC: 4.07 MIL/uL (ref 3.87–5.11)
RDW: 13.8 % (ref 11.5–15.5)
WBC: 3.4 10*3/uL — ABNORMAL LOW (ref 4.0–10.5)

## 2014-07-30 LAB — COMPREHENSIVE METABOLIC PANEL
ALK PHOS: 110 U/L (ref 39–117)
ALT: 15 U/L (ref 0–35)
ANION GAP: 14 (ref 5–15)
AST: 25 U/L (ref 0–37)
Albumin: 3.3 g/dL — ABNORMAL LOW (ref 3.5–5.2)
BUN: 18 mg/dL (ref 6–23)
CO2: 26 meq/L (ref 19–32)
CREATININE: 0.66 mg/dL (ref 0.50–1.10)
Calcium: 9.5 mg/dL (ref 8.4–10.5)
Chloride: 100 mEq/L (ref 96–112)
Glucose, Bld: 123 mg/dL — ABNORMAL HIGH (ref 70–99)
Potassium: 3.3 mEq/L — ABNORMAL LOW (ref 3.7–5.3)
Sodium: 140 mEq/L (ref 137–147)
Total Bilirubin: 0.3 mg/dL (ref 0.3–1.2)
Total Protein: 8.9 g/dL — ABNORMAL HIGH (ref 6.0–8.3)

## 2014-07-30 LAB — URINALYSIS, ROUTINE W REFLEX MICROSCOPIC
BILIRUBIN URINE: NEGATIVE
Glucose, UA: NEGATIVE mg/dL
Hgb urine dipstick: NEGATIVE
Ketones, ur: NEGATIVE mg/dL
Leukocytes, UA: NEGATIVE
NITRITE: NEGATIVE
PROTEIN: NEGATIVE mg/dL
SPECIFIC GRAVITY, URINE: 1.022 (ref 1.005–1.030)
Urobilinogen, UA: 1 mg/dL (ref 0.0–1.0)
pH: 6 (ref 5.0–8.0)

## 2014-07-30 LAB — LIPASE, BLOOD: LIPASE: 8 U/L — AB (ref 11–59)

## 2014-07-30 MED ORDER — ONDANSETRON HCL 4 MG/2ML IJ SOLN
4.0000 mg | Freq: Once | INTRAMUSCULAR | Status: AC
  Administered 2014-07-30: 4 mg via INTRAVENOUS
  Filled 2014-07-30: qty 2

## 2014-07-30 MED ORDER — HYDROMORPHONE HCL 1 MG/ML IJ SOLN
1.0000 mg | Freq: Once | INTRAMUSCULAR | Status: AC
  Administered 2014-07-30: 1 mg via INTRAVENOUS
  Filled 2014-07-30: qty 1

## 2014-07-30 MED ORDER — SODIUM CHLORIDE 0.9 % IV SOLN
Freq: Once | INTRAVENOUS | Status: AC
  Administered 2014-07-30: via INTRAVENOUS

## 2014-07-30 NOTE — ED Notes (Signed)
Pt with hx pancreatic cancer and has been having pain since yesterday with nvd. Pain is to R side of abdomen and into back.

## 2014-07-30 NOTE — ED Provider Notes (Signed)
CSN: [id number]     Arrival date & time 07/30/14  2241 History   First MD Initiated Contact with Patient 07/30/14 2313     Chief Complaint  Patient presents with  . Abdominal Pain     (Consider location/radiation/quality/duration/timing/severity/associated sxs/prior Treatment) HPI Comments: Patient with HX pancreatitic cancer due for Whipple procedure on Nov 17 has ben taking OxyContin and Oxycodone for breakthrough pain with no relief.  Today has had several episodes of vomiting.   Patient is a 63 y.o. female presenting with abdominal pain. The history is provided by the patient.  Abdominal Pain Pain location:  RUQ, LUQ and epigastric Pain quality: aching and fullness   Pain radiates to:  Back Pain severity:  Severe Onset quality:  Gradual Timing:  Constant Progression:  Worsening Chronicity:  Chronic Context: retching   Context: not medication withdrawal   Relieved by:  Nothing Worsened by:  Palpation and deep breathing Ineffective treatments:  Not moving (narcotic) Associated symptoms: nausea   Associated symptoms: no anorexia, no chest pain, no constipation, no dysuria and no shortness of breath   Nausea:    Severity:  Moderate   Onset quality:  Gradual   Timing:  Constant   Progression:  Worsening   Past Medical History  Diagnosis Date  . Arthritis   . Depression   . H/O: substance abuse   . Back pain   . Personal history of colonic polyp - adenoma 10/26/2013    10/26/2013 diminutive sigmoid polyp removed    . Pancreatitis, acute   . Hypertension   . Osteoporosis   . Radiation 04/30/14-06/08/14    pancreas 50 gray   Past Surgical History  Procedure Laterality Date  . Lumbar epidural injection    . Colonoscopy    . Ercp N/A 03/06/2014    Procedure: ENDOSCOPIC RETROGRADE CHOLANGIOPANCREATOGRAPHY (ERCP);  Surgeon: Gatha Mayer, MD;  Location: [facility] ENDOSCOPY;  Service: Endoscopy;  Laterality: N/A;  . Eus N/A 04/05/2014    Procedure: UPPER ENDOSCOPIC ULTRASOUND (EUS)  RADIAL;  Surgeon: [name], MD;  Location: [facility] ENDOSCOPY;  Service: Endoscopy;  Laterality: N/A;   Family History  Problem Relation Age of Onset  . Colon cancer Neg Hx   . Esophageal cancer Neg Hx   . Rectal cancer Neg Hx   . Stomach cancer Neg Hx   . CAD Mother   . Cancer Father   . Cirrhosis Sister     she is a heavy drinker.    History  Substance Use Topics  . Smoking status: Current Every Day Smoker -- 0.25 packs/day for 40 years    Types: Cigarettes  . Smokeless tobacco: Current User  . Alcohol Use: No     Comment: occasionally. Quit  , none in 3weeks"only drank before  to help with pain before ERD visit"   OB History    No data available     Review of Systems  Respiratory: Negative for shortness of breath.   Cardiovascular: Negative for chest pain.  Gastrointestinal: Positive for nausea, abdominal pain and abdominal distention. Negative for constipation and anorexia.  Genitourinary: Negative for dysuria.  Neurological: Negative for dizziness.      Allergies  Review of patient's allergies indicates no known allergies.  Home Medications   Prior to Admission medications   Medication Sig Start Date End Date Taking? Authorizing Provider  amLODipine (NORVASC) 5 MG tablet Take 5 mg by mouth daily.   Yes Historical Provider, MD  CALCIUM-VITAMIN D PO Take 1 capsule by  mouth daily.   Yes Historical Provider, MD  hyaluronate sodium (RADIAPLEXRX) GEL Apply 1 application topically 2 (two) times daily. 05/01/14  Yes Blair Promise, MD  ibuprofen (ADVIL,MOTRIN) 800 MG tablet Take 800 mg by mouth 2 (two) times daily as needed for mild pain or moderate pain.   Yes Historical Provider, MD  metoCLOPramide (REGLAN) 10 MG tablet Take 1 tablet (10 mg total) by mouth every 6 (six) hours. 03/18/14  Yes Mariea Clonts, MD  omeprazole (PRILOSEC) 20 MG capsule Take 40 mg by mouth daily.   Yes Historical Provider, MD  ondansetron (ZOFRAN-ODT) 4 MG disintegrating tablet Take 4 mg by  mouth every 8 (eight) hours as needed for nausea or vomiting.   Yes Historical Provider, MD  OxyCODONE (OXYCONTIN) 10 mg T12A 12 hr tablet Take 1 tablet (10 mg total) by mouth every 12 (twelve) hours. 07/26/14  Yes Blair Promise, MD  polyethylene glycol [facility] / GLYCOLAX) packet Take 17 g by mouth daily.   Yes Historical Provider, MD  psyllium (REGULOID) 0.52 G capsule Take 1 capsule (0.52 g total) by mouth daily. 05/23/14  Yes Ladell Pier, MD  sertraline (ZOLOFT) 100 MG tablet Take 100 mg by mouth every morning.    Yes Historical Provider, MD  oxyCODONE (OXY IR/ROXICODONE) 5 MG immediate release tablet Take 1 tablet (5 mg total) by mouth every 4 (four) hours as needed for severe pain. 07/03/14   Ladell Pier, MD  oxyCODONE (ROXICODONE) 5 MG immediate release tablet Take 1 or 2 po Q 4  for pain 07/31/14   Garald Balding, NP  sorbitol 70 % solution Take 15 mLs by mouth 2 (two) times daily. Increase to 30 ml twice a day for constipation 05/23/14   Ladell Pier, MD   BP 133/74 mmHg  Pulse 51  Temp(Src) 98.2 F (36.8 C) (Oral)  Resp 13  Ht 5\' 3"  (1.6 m)  Wt 121 lb (54.885 kg)  BMI 21.44 kg/m2  SpO2 100% Physical Exam  Constitutional: She appears well-developed and well-nourished.  HENT:  Head: Normocephalic.  Eyes: Pupils are equal, round, and reactive to light.  Neck: Normal range of motion.  Cardiovascular: Normal rate.   Pulmonary/Chest: Effort normal.  Abdominal: She exhibits distension. There is tenderness in the right upper quadrant, epigastric area and left upper quadrant. There is no rebound and no guarding.  Musculoskeletal: Normal range of motion.  Neurological: She is alert.  Skin: Skin is warm. No rash noted. No pallor.  Psychiatric: She has a normal mood and affect.  Nursing note and vitals reviewed.   ED Course  Procedures (including critical care time) Labs Review Labs Reviewed  CBC WITH DIFFERENTIAL - Abnormal; Notable for the following:    WBC 3.4 (*)     All other components within normal limits  COMPREHENSIVE METABOLIC PANEL - Abnormal; Notable for the following:    Potassium 3.3 (*)    Glucose, Bld 123 (*)    Total Protein 8.9 (*)    Albumin 3.3 (*)    All other components within normal limits  LIPASE, BLOOD - Abnormal; Notable for the following:    Lipase 8 (*)    All other components within normal limits  URINALYSIS, ROUTINE W REFLEX MICROSCOPIC - Abnormal; Notable for the following:    APPearance HAZY (*)    All other components within normal limits    Imaging Review Dg Abd Acute W/chest  07/31/2014   CLINICAL DATA:  Abdominal pain.  History  of pancreatic cancer.  EXAM: ACUTE ABDOMEN SERIES (ABDOMEN 2 VIEW & CHEST 1 VIEW)  COMPARISON:  CT chest, abdomen and pelvis 07/18/2014. Chest in two views abdomen 07/13/2014.  FINDINGS: Single view of the chest demonstrates clear lungs and normal heart size. No pneumothorax or pleural effusion.  Two views of the abdomen show no free intraperitoneal air. The bowel gas pattern is nonobstructive. No abnormal abdominal calcification is seen. Common bile duct stent is noted.  IMPRESSION: No acute finding chest or abdomen.   Electronically Signed   By: Inge Rise M.D.   On: 07/31/2014 01:52     EKG Interpretation None     I recommend taking Oxycodone on regular basis to not allow the pain to get away from her   Discussed lab finding and xray  With patient and daughter MDM   Final diagnoses:  Pain       Garald Balding, NP 07/31/14 [id number]  Veryl Speak, MD 07/31/14 [phone]

## 2014-07-31 ENCOUNTER — Emergency Department (HOSPITAL_COMMUNITY): Payer: Medicaid Other

## 2014-07-31 MED ORDER — OXYCODONE HCL 5 MG PO TABS: ORAL_TABLET | ORAL | Status: DC

## 2014-07-31 MED ORDER — HYDROMORPHONE HCL 1 MG/ML IJ SOLN
1.0000 mg | INTRAMUSCULAR | Status: AC
  Administered 2014-07-31 (×2): 1 mg via INTRAVENOUS
  Filled 2014-07-31 (×2): qty 1

## 2014-07-31 NOTE — Discharge Instructions (Signed)
Abdominal Pain Many things can cause belly (abdominal) pain. Most times, the belly pain is not dangerous. Many cases of belly pain can be watched and treated at home. HOME CARE   Do not take medicines that help you go poop (laxatives) unless told to by your doctor.  Only take medicine as told by your doctor.  Eat or drink as told by your doctor. Your doctor will tell you if you should be on a special diet. GET HELP IF:  You do not know what is causing your belly pain.  You have belly pain while you are sick to your stomach (nauseous) or have runny poop (diarrhea).  You have pain while you pee or poop.  Your belly pain wakes you up at night.  You have belly pain that gets worse or better when you eat.  You have belly pain that gets worse when you eat fatty foods.  You have a fever. GET HELP RIGHT AWAY IF:   The pain does not go away within 2 hours.  You keep throwing up (vomiting).  The pain changes and is only in the right or left part of the belly.  You have bloody or tarry looking poop. MAKE SURE YOU:   Understand these instructions.  Will watch your condition.  Will get help right away if you are not doing well or get worse. Document Released: 02/24/2008 Document Revised: 09/12/2013 Document Reviewed: 05/17/2013 [facility] Patient Information 2015 [place], [place]. This information is not intended to replace advice given to you by your health care provider. Make sure you discuss any questions you have with your health care provider. Please make sure to take her OxyContin on a regular basis.  I would recommend that you take your oxycodone 5 mg tablets more frequently on a regular basis rather than try to wait for 6 hours and get the pain gets away from you.  I would call your primary care physician and oncologist today reporting today's visit.

## 2014-08-01 ENCOUNTER — Telehealth (INDEPENDENT_AMBULATORY_CARE_PROVIDER_SITE_OTHER): Payer: Self-pay

## 2014-08-01 NOTE — Telephone Encounter (Signed)
[facility] called and stated that they have been trying to get in contact with pt for sx and they have not contacted them at this time. Pt is scheduled for sx on 11/17 Melinda Conrad wanted to make Dr Barry Dienes aware. Informed her that I would send Dr Barry Dienes a message. She verbalized understanding

## 2014-08-02 ENCOUNTER — Ambulatory Visit: Payer: Medicaid Other

## 2014-08-02 ENCOUNTER — Telehealth: Payer: Self-pay | Admitting: Nurse Practitioner

## 2014-08-02 ENCOUNTER — Ambulatory Visit (HOSPITAL_BASED_OUTPATIENT_CLINIC_OR_DEPARTMENT_OTHER): Payer: Medicaid Other | Admitting: Nurse Practitioner

## 2014-08-02 ENCOUNTER — Ambulatory Visit: Payer: Medicaid Other | Admitting: Nutrition

## 2014-08-02 VITALS — BP 170/87 | HR 85 | Temp 99.5°F | Resp 18 | Ht 63.0 in | Wt 121.7 lb

## 2014-08-02 DIAGNOSIS — C259 Malignant neoplasm of pancreas, unspecified: Secondary | ICD-10-CM

## 2014-08-02 DIAGNOSIS — C250 Malignant neoplasm of head of pancreas: Secondary | ICD-10-CM

## 2014-08-02 DIAGNOSIS — G893 Neoplasm related pain (acute) (chronic): Secondary | ICD-10-CM

## 2014-08-02 NOTE — Progress Notes (Signed)
  [facility] OFFICE PROGRESS NOTE   Diagnosis:  Pancreas cancer  INTERVAL HISTORY:   Ms. Ciano returns as scheduled. She continues to have abdominal and back pain. She takes OxyContin 10 mg every 12 hours with oxycodone as needed for breakthrough pain. She estimates taking 6 oxycodone tablets a day. She has intermittent nausea. Bowels are moving.  Objective:  Vital signs in last 24 hours:  Blood pressure 170/87, pulse 85, temperature 99.5 F (37.5 C), temperature source Oral, resp. rate 18, height 5\' 3"  (1.6 m), weight 121 lb 11.2 oz (55.203 kg), SpO2 99 %.    HEENT: no thrush or ulcers. Resp: lungs clear bilaterally. Cardio: regular rate and rhythm. GI: no organomegaly. No mass. Vascular: no leg edema.    Lab Results:  Lab Results  Component Value Date   WBC 3.4* 07/30/2014   HGB 12.5 07/30/2014   HCT 37.1 07/30/2014   MCV 91.2 07/30/2014   PLT 238 07/30/2014   NEUTROABS 2.0 07/30/2014    Imaging:  No results found.  Medications: I have reviewed the patient's current medications.  Assessment/Plan: 1. 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 2. Pain secondary to #1 3. constipation-likely secondary to narcotic analgesics, improved  4. anorexia/weight loss  5. tobacco and alcohol use  6. history of a colon polyp in February 2015  7. depression   Disposition: Ms. Gilles appears stable. We will obtain a followup CA 19-9 today. She is scheduled for surgery 08/07/2014. She will return for a followup  visit in approximately 4 weeks.  Plan reviewed with Dr. Benay Spice.    Ned Card ANP/GNP-BC   08/02/2014  11:15 AM

## 2014-08-02 NOTE — Telephone Encounter (Signed)
Gave avs & cal for Dec. °

## 2014-08-02 NOTE — Progress Notes (Signed)
Brief followup completed with patient.  Patient has begun to consume Impacted AR prior to Whipple surgery on November 17.   Patient reports it tastes good.    She is drinking them without mixing into a recipe.   Patient has good understanding of schedule for consumption.  **Disclaimer: This note was dictated with voice recognition software. Similar sounding words can inadvertently be transcribed and this note may contain transcription errors which may not have been corrected upon publication of note.**

## 2014-08-03 LAB — CANCER ANTIGEN 19-9: CA 19-9: 23.6 U/mL (ref <35.0)

## 2014-08-03 NOTE — Progress Notes (Signed)
PST NOTE-- I have made multiple attempts to contact patient to set up pre op-mailbox is full, or I left messages x 4 for return call from 07/25/14-08/03/14.  Daughters number also cannot leave voicemail message

## 2014-08-06 ENCOUNTER — Inpatient Hospital Stay (HOSPITAL_COMMUNITY)
Admission: EM | Admit: 2014-08-06 | Discharge: 2014-08-21 | DRG: 405 | Disposition: A | Discharge status: Home / Self Care | Payer: Medicaid Other | Attending: General Surgery | Admitting: General Surgery

## 2014-08-06 ENCOUNTER — Encounter (HOSPITAL_COMMUNITY): Payer: Self-pay | Admitting: Emergency Medicine

## 2014-08-06 ENCOUNTER — Encounter (HOSPITAL_COMMUNITY): Payer: Self-pay | Admitting: Anesthesiology

## 2014-08-06 DIAGNOSIS — E43 Unspecified severe protein-calorie malnutrition: Secondary | ICD-10-CM | POA: Insufficient documentation

## 2014-08-06 DIAGNOSIS — F329 Major depressive disorder, single episode, unspecified: Secondary | ICD-10-CM | POA: Diagnosis present

## 2014-08-06 DIAGNOSIS — Z6820 Body mass index (BMI) 20.0-20.9, adult: Secondary | ICD-10-CM

## 2014-08-06 DIAGNOSIS — D62 Acute posthemorrhagic anemia: Secondary | ICD-10-CM | POA: Diagnosis present

## 2014-08-06 DIAGNOSIS — E861 Hypovolemia: Secondary | ICD-10-CM | POA: Diagnosis present

## 2014-08-06 DIAGNOSIS — D649 Anemia, unspecified: Secondary | ICD-10-CM | POA: Diagnosis present

## 2014-08-06 DIAGNOSIS — I10 Essential (primary) hypertension: Secondary | ICD-10-CM | POA: Diagnosis present

## 2014-08-06 DIAGNOSIS — M1990 Unspecified osteoarthritis, unspecified site: Secondary | ICD-10-CM | POA: Diagnosis present

## 2014-08-06 DIAGNOSIS — E46 Unspecified protein-calorie malnutrition: Secondary | ICD-10-CM | POA: Diagnosis present

## 2014-08-06 DIAGNOSIS — D709 Neutropenia, unspecified: Secondary | ICD-10-CM | POA: Diagnosis present

## 2014-08-06 DIAGNOSIS — E119 Type 2 diabetes mellitus without complications: Secondary | ICD-10-CM | POA: Diagnosis present

## 2014-08-06 DIAGNOSIS — R509 Fever, unspecified: Secondary | ICD-10-CM

## 2014-08-06 DIAGNOSIS — E876 Hypokalemia: Secondary | ICD-10-CM | POA: Diagnosis not present

## 2014-08-06 DIAGNOSIS — C259 Malignant neoplasm of pancreas, unspecified: Secondary | ICD-10-CM

## 2014-08-06 DIAGNOSIS — C250 Malignant neoplasm of head of pancreas: Principal | ICD-10-CM | POA: Diagnosis present

## 2014-08-06 DIAGNOSIS — M549 Dorsalgia, unspecified: Secondary | ICD-10-CM | POA: Diagnosis present

## 2014-08-06 DIAGNOSIS — F17210 Nicotine dependence, cigarettes, uncomplicated: Secondary | ICD-10-CM | POA: Diagnosis present

## 2014-08-06 DIAGNOSIS — J189 Pneumonia, unspecified organism: Secondary | ICD-10-CM | POA: Diagnosis present

## 2014-08-06 DIAGNOSIS — K660 Peritoneal adhesions (postprocedural) (postinfection): Secondary | ICD-10-CM | POA: Diagnosis present

## 2014-08-06 DIAGNOSIS — M810 Age-related osteoporosis without current pathological fracture: Secondary | ICD-10-CM | POA: Diagnosis present

## 2014-08-06 DIAGNOSIS — Z86010 Personal history of colonic polyps: Secondary | ICD-10-CM

## 2014-08-06 DIAGNOSIS — K5900 Constipation, unspecified: Secondary | ICD-10-CM | POA: Diagnosis present

## 2014-08-06 HISTORY — DX: Malignant (primary) neoplasm, unspecified: C80.1

## 2014-08-06 LAB — PROTIME-INR
INR: 0.96 (ref 0.00–1.49)
Prothrombin Time: 12.9 seconds (ref 11.6–15.2)

## 2014-08-06 LAB — COMPREHENSIVE METABOLIC PANEL
ALK PHOS: 91 U/L (ref 39–117)
ALT: 15 U/L (ref 0–35)
AST: 24 U/L (ref 0–37)
Albumin: 2.9 g/dL — ABNORMAL LOW (ref 3.5–5.2)
Anion gap: 12 (ref 5–15)
BUN: 10 mg/dL (ref 6–23)
CALCIUM: 8.7 mg/dL (ref 8.4–10.5)
CO2: 23 mEq/L (ref 19–32)
Chloride: 106 mEq/L (ref 96–112)
Creatinine, Ser: 0.48 mg/dL — ABNORMAL LOW (ref 0.50–1.10)
GFR calc Af Amer: >90 mL/min (ref >90)
GFR calc non Af Amer: >90 mL/min (ref >90)
Glucose, Bld: 113 mg/dL — ABNORMAL HIGH (ref 70–99)
POTASSIUM: 3.1 meq/L — AB (ref 3.7–5.3)
SODIUM: 141 meq/L (ref 137–147)
TOTAL PROTEIN: 8.2 g/dL (ref 6.0–8.3)
Total Bilirubin: 0.3 mg/dL (ref 0.3–1.2)

## 2014-08-06 LAB — CBC WITH DIFFERENTIAL/PLATELET
Basophils Absolute: 0 10*3/uL (ref 0.0–0.1)
Basophils Relative: 0 % (ref 0–1)
Eosinophils Absolute: 0.1 10*3/uL (ref 0.0–0.7)
Eosinophils Relative: 2 % (ref 0–5)
HCT: 34.6 % — ABNORMAL LOW (ref 36.0–46.0)
Hemoglobin: 11.7 g/dL — ABNORMAL LOW (ref 12.0–15.0)
Lymphocytes Relative: 18 % (ref 12–46)
Lymphs Abs: 0.7 10*3/uL (ref 0.7–4.0)
MCH: 31.3 pg (ref 26.0–34.0)
MCHC: 33.8 g/dL (ref 30.0–36.0)
MCV: 92.5 fL (ref 78.0–100.0)
Monocytes Absolute: 0.5 10*3/uL (ref 0.1–1.0)
Monocytes Relative: 13 % — ABNORMAL HIGH (ref 3–12)
Neutro Abs: 2.3 10*3/uL (ref 1.7–7.7)
Neutrophils Relative %: 67 % (ref 43–77)
Platelets: 211 10*3/uL (ref 150–400)
RBC: 3.74 MIL/uL — ABNORMAL LOW (ref 3.87–5.11)
RDW: 13.4 % (ref 11.5–15.5)
WBC: 3.5 10*3/uL — ABNORMAL LOW (ref 4.0–10.5)

## 2014-08-06 LAB — URINALYSIS, ROUTINE W REFLEX MICROSCOPIC
Bilirubin Urine: NEGATIVE
Glucose, UA: NEGATIVE mg/dL
Hgb urine dipstick: NEGATIVE
Ketones, ur: NEGATIVE mg/dL
Leukocytes, UA: NEGATIVE
Nitrite: NEGATIVE
Protein, ur: NEGATIVE mg/dL
Specific Gravity, Urine: 1.012 (ref 1.005–1.030)
Urobilinogen, UA: 1 mg/dL (ref 0.0–1.0)
pH: 7 (ref 5.0–8.0)

## 2014-08-06 LAB — ABO/RH: ABO/RH(D): O POS

## 2014-08-06 LAB — PREPARE RBC (CROSSMATCH)

## 2014-08-06 LAB — LIPASE, BLOOD: Lipase: 7 U/L — ABNORMAL LOW (ref 11–59)

## 2014-08-06 MED ORDER — FLEET ENEMA 7-19 GM/118ML RE ENEM
1.0000 | ENEMA | Freq: Once | RECTAL | Status: AC
  Administered 2014-08-06: 1 via RECTAL
  Filled 2014-08-06: qty 1

## 2014-08-06 MED ORDER — ONDANSETRON HCL 4 MG/2ML IJ SOLN: 4.0000 mg | Freq: Four times a day (QID) | INTRAMUSCULAR | Status: DC | PRN

## 2014-08-06 MED ORDER — CETYLPYRIDINIUM CHLORIDE 0.05 % MT LIQD
7.0000 mL | Freq: Two times a day (BID) | OROMUCOSAL | Status: DC
  Administered 2014-08-07 – 2014-08-14 (×11): 7 mL via OROMUCOSAL

## 2014-08-06 MED ORDER — HYDROMORPHONE HCL 1 MG/ML IJ SOLN
1.0000 mg | INTRAMUSCULAR | Status: DC | PRN
  Administered 2014-08-06: 1 mg via INTRAVENOUS
  Administered 2014-08-07 (×2): 2 mg via INTRAVENOUS
  Administered 2014-08-08: 1 mg via INTRAVENOUS
  Administered 2014-08-08: 2 mg via INTRAVENOUS
  Administered 2014-08-08 – 2014-08-09 (×2): 1 mg via INTRAVENOUS
  Administered 2014-08-09 (×2): 2 mg via INTRAVENOUS
  Administered 2014-08-09: 3 mg via INTRAVENOUS
  Administered 2014-08-10 – 2014-08-16 (×3): 1 mg via INTRAVENOUS
  Administered 2014-08-17: 2 mg via INTRAVENOUS
  Administered 2014-08-17 – 2014-08-18 (×2): 1 mg via INTRAVENOUS
  Filled 2014-08-06: qty 1
  Filled 2014-08-06 (×2): qty 2
  Filled 2014-08-06: qty 1
  Filled 2014-08-06: qty 2
  Filled 2014-08-06: qty 3
  Filled 2014-08-06: qty 1
  Filled 2014-08-06 (×2): qty 2
  Filled 2014-08-06 (×3): qty 1
  Filled 2014-08-06 (×2): qty 2
  Filled 2014-08-06: qty 1
  Filled 2014-08-06: qty 2

## 2014-08-06 MED ORDER — DIPHENHYDRAMINE HCL 12.5 MG/5ML PO ELIX: 12.5000 mg | ORAL_SOLUTION | Freq: Four times a day (QID) | ORAL | Status: DC | PRN

## 2014-08-06 MED ORDER — CEFOXITIN SODIUM 2 G IV SOLR
2.0000 g | INTRAVENOUS | Status: AC
  Filled 2014-08-06 (×2): qty 2

## 2014-08-06 MED ORDER — CHLORHEXIDINE GLUCONATE 0.12 % MT SOLN
15.0000 mL | Freq: Two times a day (BID) | OROMUCOSAL | Status: DC
  Administered 2014-08-06: 15 mL via OROMUCOSAL
  Filled 2014-08-06 (×4): qty 15

## 2014-08-06 MED ORDER — PANTOPRAZOLE SODIUM 40 MG IV SOLR
40.0000 mg | Freq: Every day | INTRAVENOUS | Status: DC
  Administered 2014-08-06 – 2014-08-20 (×15): 40 mg via INTRAVENOUS
  Filled 2014-08-06 (×17): qty 40

## 2014-08-06 MED ORDER — KCL IN DEXTROSE-NACL 20-5-0.45 MEQ/L-%-% IV SOLN
INTRAVENOUS | Status: DC
  Administered 2014-08-06 – 2014-08-08 (×3): via INTRAVENOUS
  Filled 2014-08-06 (×4): qty 1000

## 2014-08-06 MED ORDER — DIPHENHYDRAMINE HCL 50 MG/ML IJ SOLN: 12.5000 mg | Freq: Four times a day (QID) | INTRAMUSCULAR | Status: DC | PRN

## 2014-08-06 MED ORDER — SODIUM CHLORIDE 0.9 % IV BOLUS (SEPSIS)
1000.0000 mL | Freq: Once | INTRAVENOUS | Status: AC
  Administered 2014-08-06: 1000 mL via INTRAVENOUS

## 2014-08-06 MED ORDER — HYDROMORPHONE HCL 1 MG/ML IJ SOLN
1.0000 mg | Freq: Once | INTRAMUSCULAR | Status: AC
  Administered 2014-08-06: 1 mg via INTRAVENOUS
  Filled 2014-08-06: qty 1

## 2014-08-06 MED ORDER — POTASSIUM CHLORIDE 10 MEQ/100ML IV SOLN
10.0000 meq | INTRAVENOUS | Status: AC
  Administered 2014-08-06 – 2014-08-07 (×2): 10 meq via INTRAVENOUS
  Filled 2014-08-06 (×2): qty 100

## 2014-08-06 NOTE — Progress Notes (Signed)
  CARE MANAGEMENT ED NOTE 08/06/2014  Patient:  Melinda Conrad, Melinda Conrad   Account Number:  [id number]  Date Initiated:  08/06/2014  Documentation initiated by:  Livia Snellen  Subjective/Objective Assessment:   Patient presents to Ed with wosrening abdominal and back pain, N/V     Subjective/Objective Assessment Detail:   Patient with pancreatic cancer     Action/Plan:   Action/Plan Detail:   Anticipated DC Date:       Status Recommendation to Physician:   Result of Recommendation:    Other ED Services  Consult Working [place]  Other  Medication Assistance  CM consult    Choice offered to / List presented to:            Status of service:  Completed, signed off  ED Comments:   ED Comments Detail:  [facility] consulted for medication needs.  Patient listed as having Mediciad insurnace.  Patient reports her medications are three dollars.  "I can afford it, I just don't know when I'm going to have the money or not.  I get my check at the begining of the month."  Patient reports she receives a disability check on the first of the month.  Patient confrims her pcp is Dr. Kevan Ny.  Patient has not seen Dr. Marlou Sa in approximately five months.  Patient also is seen at [facility] by Dr.Sherrill per patient. Patient reports she lives alone in her apartment but has been satying with her daughter recently.  No further EDCM needs at this time.

## 2014-08-06 NOTE — Anesthesia Preprocedure Evaluation (Signed)
Anesthesia Evaluation  Patient identified by MRN, date of birth, ID band Patient awake    Reviewed: Allergy & Precautions, H&P , NPO status , Patient's Chart, lab work & pertinent test results  Airway Mallampati: II  TM Distance: >3 FB Neck ROM: Full    Dental no notable dental hx.    Pulmonary neg pulmonary ROS, Current Smoker,  breath sounds clear to auscultation  Pulmonary exam normal       Cardiovascular hypertension, Pt. on medications Rhythm:Regular Rate:Normal     Neuro/Psych PSYCHIATRIC DISORDERS Depression negative neurological ROS     GI/Hepatic negative GI ROS, Neg liver ROS,   Endo/Other  negative endocrine ROS  Renal/GU negative Renal ROS  negative genitourinary   Musculoskeletal  (+) Arthritis -,   Abdominal   Peds negative pediatric ROS (+)  Hematology negative hematology ROS (+)   Anesthesia Other Findings   Reproductive/Obstetrics negative OB ROS                             Anesthesia Physical Anesthesia Plan  ASA: II  Anesthesia Plan: General   Post-op Pain Management:    Induction: Intravenous  Airway Management Planned: Oral ETT  Additional Equipment: Arterial line  Intra-op Plan:   Post-operative Plan: Extubation in OR  Informed Consent: I have reviewed the patients History and Physical, chart, labs and discussed the procedure including the risks, benefits and alternatives for the proposed anesthesia with the patient or authorized representative who has indicated his/her understanding and acceptance.   Dental advisory given  Plan Discussed with: CRNA  Anesthesia Plan Comments:         Anesthesia Quick Evaluation

## 2014-08-06 NOTE — ED Notes (Signed)
Pt states that she has pancreatic cancer and is due for a whipple procedure tomorrow but is having abdominal and back pain that has worsened today. Alert and oriented.

## 2014-08-06 NOTE — Progress Notes (Signed)
Have made two more attempts to contact Melinda Conrad- one 11/13, i today as well.  Daughters phone number was tried also.  Notified Melinda Conrad at Dr Chuck Hint of no response to notify surgeon

## 2014-08-06 NOTE — H&P (Signed)
Melinda Conrad is an 63 y.o. female.   Chief Complaint: belly pain  HPI: 63 yo AAF with pancreatic cancer scheduled for Whipple procedure in the am by Dr Barry Dienes comes in to ED c/o worsening abd and back pain and nausea/vomiting. Pain has gotten worse and started vomiting earlier today. Has chronic nausea. Pt unaware that she had preop appts - forgot. Been staying with her daughter.   History:  The patient is a 63 year old female who presents with pancreatic cancer. Previous history: [The patient is being seen for follow up for ( T3 and N0 ) ductal adenocarcinoma of the head of the pancreas. Disease involvement includes adjacent structures (portal vein). Associated conditions include diabetes mellitus. Initial presentation was 4 month(s) ago for weight loss, fatigue, jaundice, abdominal pain and back pain. Treatment has included neoadjuvant chemotherapy and radiation therapy. Symptoms include abdominal pain and back pain, while symptoms do not include diarrhea or bloating. She has completed neoadjuvant chemoradiation. She needs restaging studies. I am concerned about her level of pain. She clinically has made a dramatic improvement since I saw her in June. She is well groomed. She is more energetic. Her pain is better controlled. ] The patient went to the ED for pain on october 23. her CT at that time showed potential celiac and SMA involvement. However, she subsequently underwent pancreatic protocol CT which was much more promising. She is doing fairly well again.   Past Medical History  Diagnosis Date  . Arthritis   . Depression   . H/O: substance abuse   . Back pain   . Personal history of colonic polyp - adenoma 10/26/2013    10/26/2013 diminutive sigmoid polyp removed    . Pancreatitis, acute   . Hypertension   . Osteoporosis   . Radiation 04/30/14-06/08/14    pancreas 50 gray  . Cancer     pancreatic cancer    Past Surgical History  Procedure Laterality Date  . Lumbar epidural  injection    . Colonoscopy    . Ercp N/A 03/06/2014    Procedure: ENDOSCOPIC RETROGRADE CHOLANGIOPANCREATOGRAPHY (ERCP);  Surgeon: Gatha Mayer, MD;  Location: [facility] ENDOSCOPY;  Service: Endoscopy;  Laterality: N/A;  . Eus N/A 04/05/2014    Procedure: UPPER ENDOSCOPIC ULTRASOUND (EUS) RADIAL;  Surgeon: [name], MD;  Location: [facility] ENDOSCOPY;  Service: Endoscopy;  Laterality: N/A;    Family History  Problem Relation Age of Onset  . Colon cancer Neg Hx   . Esophageal cancer Neg Hx   . Rectal cancer Neg Hx   . Stomach cancer Neg Hx   . CAD Mother   . Cancer Father   . Cirrhosis Sister     she is a heavy drinker.    Social History:  reports that she has been smoking Cigarettes.  She has a 10 pack-year smoking history. She uses smokeless tobacco. She reports that she does not drink alcohol or use illicit drugs.  Allergies: No Known Allergies   (Not in a hospital admission)  Results for orders placed or performed during the hospital encounter of 08/06/14 (from the past 48 hour(s))  Urinalysis, Routine w reflex microscopic     Status: None   Collection Time: 08/06/14  6:45 PM  Result Value Ref Range   Color, Urine YELLOW YELLOW   APPearance CLEAR CLEAR   Specific Gravity, Urine 1.012 1.005 - 1.030   pH 7.0 5.0 - 8.0   Glucose, UA NEGATIVE NEGATIVE mg/dL   Hgb urine dipstick  NEGATIVE NEGATIVE   Bilirubin Urine NEGATIVE NEGATIVE   Ketones, ur NEGATIVE NEGATIVE mg/dL   Protein, ur NEGATIVE NEGATIVE mg/dL   Urobilinogen, UA 1.0 0.0 - 1.0 mg/dL   Nitrite NEGATIVE NEGATIVE   Leukocytes, UA NEGATIVE NEGATIVE    Comment: MICROSCOPIC NOT DONE ON URINES WITH NEGATIVE PROTEIN, BLOOD, LEUKOCYTES, NITRITE, OR GLUCOSE <1000 mg/dL.  Comprehensive metabolic panel     Status: Abnormal   Collection Time: 08/06/14  7:29 PM  Result Value Ref Range   Sodium 141 137 - 147 mEq/L   Potassium 3.1 (L) 3.7 - 5.3 mEq/L   Chloride 106 96 - 112 mEq/L   CO2 23 19 - 32 mEq/L   Glucose, Bld 113 (H) 70 -  99 mg/dL   BUN 10 6 - 23 mg/dL   Creatinine, Ser 7.99 (L) 0.50 - 1.10 mg/dL   Calcium 8.7 8.4 - 87.2 mg/dL   Total Protein 8.2 6.0 - 8.3 g/dL   Albumin 2.9 (L) 3.5 - 5.2 g/dL   AST 24 0 - 37 U/L   ALT 15 0 - 35 U/L   Alkaline Phosphatase 91 39 - 117 U/L   Total Bilirubin 0.3 0.3 - 1.2 mg/dL   GFR calc non Af Amer >90 >90 mL/min   GFR calc Af Amer >90 >90 mL/min    Comment: (NOTE) The eGFR has been calculated using the CKD EPI equation. This calculation has not been validated in all clinical situations. eGFR's persistently <90 mL/min signify possible Chronic Kidney Disease.    Anion gap 12 5 - 15  Lipase, blood     Status: Abnormal   Collection Time: 08/06/14  7:29 PM  Result Value Ref Range   Lipase 7 (L) 11 - 59 U/L  CBC with Differential     Status: Abnormal   Collection Time: 08/06/14  7:29 PM  Result Value Ref Range   WBC 3.5 (L) 4.0 - 10.5 K/uL   RBC 3.74 (L) 3.87 - 5.11 MIL/uL   Hemoglobin 11.7 (L) 12.0 - 15.0 g/dL   HCT 15.8 (L) 72.7 - 61.8 %   MCV 92.5 78.0 - 100.0 fL   MCH 31.3 26.0 - 34.0 pg   MCHC 33.8 30.0 - 36.0 g/dL   RDW 48.5 92.7 - 63.9 %   Platelets 211 150 - 400 K/uL   Neutrophils Relative % 67 43 - 77 %   Neutro Abs 2.3 1.7 - 7.7 K/uL   Lymphocytes Relative 18 12 - 46 %   Lymphs Abs 0.7 0.7 - 4.0 K/uL   Monocytes Relative 13 (H) 3 - 12 %   Monocytes Absolute 0.5 0.1 - 1.0 K/uL   Eosinophils Relative 2 0 - 5 %   Eosinophils Absolute 0.1 0.0 - 0.7 K/uL   Basophils Relative 0 0 - 1 %   Basophils Absolute 0.0 0.0 - 0.1 K/uL   No results found.  Review of Systems  Constitutional: Negative for weight loss.  HENT: Negative for nosebleeds.   Eyes: Negative for blurred vision.  Respiratory: Negative for shortness of breath.   Cardiovascular: Negative for chest pain, palpitations, orthopnea and PND.       Denies DOE  Gastrointestinal: Positive for nausea, vomiting and abdominal pain.  Genitourinary: Negative for dysuria and hematuria.   Musculoskeletal: Positive for back pain.  Skin: Negative for itching and rash.  Neurological: Negative for dizziness, focal weakness, seizures, loss of consciousness and headaches.       Denies TIAs, amaurosis fugax  Endo/Heme/Allergies: Does not  bruise/bleed easily.  Psychiatric/Behavioral: The patient is not nervous/anxious.     Blood pressure 168/86, pulse 63, temperature 98.1 F (36.7 C), temperature source Oral, resp. rate 14, SpO2 100 %. Physical Exam  Vitals reviewed. Constitutional: She is oriented to person, place, and time. She appears well-developed and well-nourished. No distress.  Older than stated age  HENT:  Head: Normocephalic and atraumatic.  Right Ear: External ear normal.  Left Ear: External ear normal.  Eyes: Conjunctivae are normal. No scleral icterus.  Neck: Normal range of motion. Neck supple. No tracheal deviation present. No thyromegaly present.  Cardiovascular: Normal rate and normal heart sounds.   Respiratory: Effort normal and breath sounds normal. No stridor. No respiratory distress. She has no wheezes.  GI: Soft. She exhibits no distension. Tenderness: very mild TTP. There is no rebound and no guarding.  Musculoskeletal: She exhibits no edema or tenderness.  Lymphadenopathy:    She has no cervical adenopathy.  Neurological: She is alert and oriented to person, place, and time. She exhibits normal muscle tone.  Skin: Skin is warm and dry. No rash noted. She is not diaphoretic. No erythema. No pallor.  Psychiatric: She has a normal mood and affect. Her behavior is normal. Judgment and thought content normal.     Assessment/Plan Pancreatic cancer Abdominal/back pain secondary to above HTN Hypokalemia Protein calorie malnutrition Neutropenia - stable Mild anemia   Will finish preop labs in ED - blood screen, coags Admit for pain control, potassium replacement, IVF Plan for OR in AM with Dr Michaelene Song. Redmond Pulling, MD, FACS General, Bariatric, &  Minimally Invasive Surgery [facility], [place]   [facility] M 08/06/2014, 9:14 PM

## 2014-08-06 NOTE — ED Provider Notes (Signed)
CSN: [id number]     Arrival date & time 08/06/14  1735 History   First MD Initiated Contact with Patient 08/06/14 1820     Chief Complaint  Patient presents with  . Abdominal Pain  . Pancreatic Cancer     (Consider location/radiation/quality/duration/timing/severity/associated sxs/prior Treatment) Patient is a 63 y.o. female presenting with abdominal pain.  Abdominal Pain Pain location:  Epigastric Pain quality: sharp   Pain radiates to:  Does not radiate Pain severity:  Moderate Onset quality:  Gradual Duration:  3 days Timing:  Constant Progression:  Worsening Chronicity:  Recurrent Context comment:  Pancreatic cancer scheduled for surgery in am Relieved by:  Nothing Worsened by:  Movement, vomiting and eating Associated symptoms: nausea and vomiting   Associated symptoms: no anorexia, no chest pain, no chills, no constipation, no cough, no diarrhea, no dysuria, no fever, no shortness of breath, no sore throat, no vaginal bleeding and no vaginal discharge     Past Medical History  Diagnosis Date  . Arthritis   . Depression   . H/O: substance abuse   . Back pain   . Personal history of colonic polyp - adenoma 10/26/2013    10/26/2013 diminutive sigmoid polyp removed    . Pancreatitis, acute   . Hypertension   . Osteoporosis   . Radiation 04/30/14-06/08/14    pancreas 50 gray  . Cancer     pancreatic cancer   Past Surgical History  Procedure Laterality Date  . Lumbar epidural injection    . Colonoscopy    . Ercp N/A 03/06/2014    Procedure: ENDOSCOPIC RETROGRADE CHOLANGIOPANCREATOGRAPHY (ERCP);  Surgeon: Gatha Mayer, MD;  Location: [facility] ENDOSCOPY;  Service: Endoscopy;  Laterality: N/A;  . Eus N/A 04/05/2014    Procedure: UPPER ENDOSCOPIC ULTRASOUND (EUS) RADIAL;  Surgeon: [name], MD;  Location: [facility] ENDOSCOPY;  Service: Endoscopy;  Laterality: N/A;   Family History  Problem Relation Age of Onset  . Colon cancer Neg Hx   . Esophageal cancer Neg Hx   . Rectal  cancer Neg Hx   . Stomach cancer Neg Hx   . CAD Mother   . Cancer Father   . Cirrhosis Sister     she is a heavy drinker.    History  Substance Use Topics  . Smoking status: Current Every Day Smoker -- 0.25 packs/day for 40 years    Types: Cigarettes  . Smokeless tobacco: Current User  . Alcohol Use: No     Comment: occasionally. Quit  , none in [place] drank before  to help with pain before ERD visit"   OB History    No data available     Review of Systems  Constitutional: Negative for fever and chills.  HENT: Negative for congestion, rhinorrhea and sore throat.   Eyes: Negative for photophobia and visual disturbance.  Respiratory: Negative for cough and shortness of breath.   Cardiovascular: Negative for chest pain and leg swelling.  Gastrointestinal: Positive for nausea, vomiting and abdominal pain. Negative for diarrhea, constipation and anorexia.  Endocrine: Negative for polyphagia and polyuria.  Genitourinary: Negative for dysuria, flank pain, vaginal bleeding, vaginal discharge and enuresis.  Musculoskeletal: Negative for back pain and gait problem.  Skin: Negative for color change and rash.  Neurological: Negative for dizziness, syncope, light-headedness and numbness.  Hematological: Negative for adenopathy. Does not bruise/bleed easily.  All other systems reviewed and are negative.     Allergies  Review of patient's allergies indicates no known allergies.  Home Medications  Prior to Admission medications   Medication Sig Start Date End Date Taking? Authorizing Provider  amLODipine (NORVASC) 5 MG tablet Take 5 mg by mouth daily.   Yes Historical Provider, MD  CALCIUM-VITAMIN D PO Take 1 capsule by mouth daily.   Yes Historical Provider, MD  ENSURE (ENSURE) Take 237 mLs by mouth 3 (three) times daily between meals.   Yes Historical Provider, MD  ibuprofen (ADVIL,MOTRIN) 800 MG tablet Take 800 mg by mouth 2 (two) times daily as needed for mild pain or  moderate pain (pain).    Yes Historical Provider, MD  metoCLOPramide (REGLAN) 10 MG tablet Take 1 tablet (10 mg total) by mouth every 6 (six) hours. 03/18/14  Yes Mariea Clonts, MD  omeprazole (PRILOSEC) 20 MG capsule Take 40 mg by mouth daily.   Yes Historical Provider, MD  ondansetron (ZOFRAN-ODT) 4 MG disintegrating tablet Take 4 mg by mouth every 8 (eight) hours as needed for nausea or vomiting.   Yes Historical Provider, MD  oxyCODONE (OXY IR/ROXICODONE) 5 MG immediate release tablet Take 1 tablet (5 mg total) by mouth every 4 (four) hours as needed for severe pain. 07/03/14  Yes Ladell Pier, MD  OxyCODONE (OXYCONTIN) 10 mg T12A 12 hr tablet Take 1 tablet (10 mg total) by mouth every 12 (twelve) hours. 07/26/14  Yes Blair Promise, MD  polyethylene glycol [facility] / GLYCOLAX) packet Take 17 g by mouth daily.   Yes Historical Provider, MD  psyllium (REGULOID) 0.52 G capsule Take 1 capsule (0.52 g total) by mouth daily. 05/23/14  Yes Ladell Pier, MD  sertraline (ZOLOFT) 100 MG tablet Take 100 mg by mouth every morning.    Yes Historical Provider, MD  hyaluronate sodium (RADIAPLEXRX) GEL Apply 1 application topically 2 (two) times daily. 05/01/14   Blair Promise, MD  oxyCODONE (ROXICODONE) 5 MG immediate release tablet Take 1 or 2 po Q 4  for pain Patient not taking: Reported on 08/06/2014 07/31/14   Garald Balding, NP  sorbitol 70 % solution Take 15 mLs by mouth 2 (two) times daily. Increase to 30 ml twice a day for constipation 05/23/14   Ladell Pier, MD   BP 169/79 mmHg  Pulse 71  Temp(Src) 98.4 F (36.9 C) (Oral)  Resp 18  Ht 5\' 3"  (1.6 m)  Wt 118 lb 6.4 oz (53.706 kg)  BMI 20.98 kg/m2  SpO2 100% Physical Exam  Constitutional: She is oriented to person, place, and time. She appears well-developed and well-nourished.  HENT:  Head: Normocephalic and atraumatic.  Right Ear: External ear normal.  Left Ear: External ear normal.  Eyes: Conjunctivae and EOM are normal. Pupils are  equal, round, and reactive to light.  Neck: Normal range of motion. Neck supple.  Cardiovascular: Normal rate, regular rhythm, normal heart sounds and intact distal pulses.   Pulmonary/Chest: Effort normal and breath sounds normal.  Abdominal: Soft. Bowel sounds are normal. There is tenderness in the epigastric area.  Musculoskeletal: Normal range of motion.  Neurological: She is alert and oriented to person, place, and time.  Skin: Skin is warm and dry.  Vitals reviewed.   ED Course  Procedures (including critical care time) Labs Review Labs Reviewed  COMPREHENSIVE METABOLIC PANEL - Abnormal; Notable for the following:    Potassium 3.1 (*)    Glucose, Bld 113 (*)    Creatinine, Ser 0.48 (*)    Albumin 2.9 (*)    All other components within normal limits  LIPASE, BLOOD -  Abnormal; Notable for the following:    Lipase 7 (*)    All other components within normal limits  CBC WITH DIFFERENTIAL - Abnormal; Notable for the following:    WBC 3.5 (*)    RBC 3.74 (*)    Hemoglobin 11.7 (*)    HCT 34.6 (*)    Monocytes Relative 13 (*)    All other components within normal limits  CBC WITH DIFFERENTIAL - Abnormal; Notable for the following:    WBC 3.8 (*)    All other components within normal limits  COMPREHENSIVE METABOLIC PANEL - Abnormal; Notable for the following:    Creatinine, Ser 0.47 (*)    Total Protein 8.9 (*)    Albumin 3.2 (*)    All other components within normal limits  SURGICAL PCR SCREEN  URINALYSIS, ROUTINE W REFLEX MICROSCOPIC  PROTIME-INR  PREPARE RBC (CROSSMATCH)  TYPE AND SCREEN  ABO/RH  SURGICAL PATHOLOGY    Imaging Review No results found.   EKG Interpretation None      MDM   Final diagnoses:  Malignant neoplasm of pancreas, unspecified location of malignancy    63 y.o. 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  Pt is scheduled for pancreatectomy  tomorrow, but states she cannot make it.  On arrival vitals and physical exam as above.    Labs obtained and unremarkable.  Consulted surgery for pain control and pt admitted.   1. Malignant neoplasm of pancreas, unspecified location of malignancy         Debby Freiberg, MD 08/07/14 1218

## 2014-08-07 ENCOUNTER — Other Ambulatory Visit: Payer: Self-pay

## 2014-08-07 ENCOUNTER — Inpatient Hospital Stay (HOSPITAL_COMMUNITY): Admission: RE | Admit: 2014-08-07 | Payer: Medicaid Other | Source: Ambulatory Visit | Admitting: General Surgery

## 2014-08-07 ENCOUNTER — Observation Stay (HOSPITAL_COMMUNITY): Payer: Medicaid Other | Admitting: Anesthesiology

## 2014-08-07 ENCOUNTER — Encounter (HOSPITAL_COMMUNITY): Payer: Self-pay | Admitting: Certified Registered Nurse Anesthetist

## 2014-08-07 ENCOUNTER — Encounter (HOSPITAL_COMMUNITY)
Admission: EM | Disposition: A | Discharge status: Home / Self Care | Payer: Self-pay | Source: Home / Self Care | Attending: General Surgery

## 2014-08-07 DIAGNOSIS — F329 Major depressive disorder, single episode, unspecified: Secondary | ICD-10-CM | POA: Diagnosis present

## 2014-08-07 DIAGNOSIS — D709 Neutropenia, unspecified: Secondary | ICD-10-CM | POA: Diagnosis present

## 2014-08-07 DIAGNOSIS — K5900 Constipation, unspecified: Secondary | ICD-10-CM | POA: Diagnosis present

## 2014-08-07 DIAGNOSIS — E46 Unspecified protein-calorie malnutrition: Secondary | ICD-10-CM | POA: Diagnosis present

## 2014-08-07 DIAGNOSIS — D62 Acute posthemorrhagic anemia: Secondary | ICD-10-CM | POA: Diagnosis present

## 2014-08-07 DIAGNOSIS — Z86010 Personal history of colonic polyps: Secondary | ICD-10-CM | POA: Diagnosis not present

## 2014-08-07 DIAGNOSIS — M810 Age-related osteoporosis without current pathological fracture: Secondary | ICD-10-CM | POA: Diagnosis present

## 2014-08-07 DIAGNOSIS — M1990 Unspecified osteoarthritis, unspecified site: Secondary | ICD-10-CM | POA: Diagnosis present

## 2014-08-07 DIAGNOSIS — K660 Peritoneal adhesions (postprocedural) (postinfection): Secondary | ICD-10-CM | POA: Diagnosis present

## 2014-08-07 DIAGNOSIS — M549 Dorsalgia, unspecified: Secondary | ICD-10-CM | POA: Diagnosis present

## 2014-08-07 DIAGNOSIS — D649 Anemia, unspecified: Secondary | ICD-10-CM | POA: Diagnosis present

## 2014-08-07 DIAGNOSIS — E876 Hypokalemia: Secondary | ICD-10-CM | POA: Diagnosis not present

## 2014-08-07 DIAGNOSIS — E119 Type 2 diabetes mellitus without complications: Secondary | ICD-10-CM | POA: Diagnosis present

## 2014-08-07 DIAGNOSIS — C250 Malignant neoplasm of head of pancreas: Secondary | ICD-10-CM | POA: Diagnosis present

## 2014-08-07 DIAGNOSIS — J189 Pneumonia, unspecified organism: Secondary | ICD-10-CM | POA: Diagnosis present

## 2014-08-07 DIAGNOSIS — Z6820 Body mass index (BMI) 20.0-20.9, adult: Secondary | ICD-10-CM | POA: Diagnosis not present

## 2014-08-07 DIAGNOSIS — E43 Unspecified severe protein-calorie malnutrition: Secondary | ICD-10-CM | POA: Diagnosis present

## 2014-08-07 DIAGNOSIS — E861 Hypovolemia: Secondary | ICD-10-CM | POA: Diagnosis present

## 2014-08-07 DIAGNOSIS — I10 Essential (primary) hypertension: Secondary | ICD-10-CM | POA: Diagnosis present

## 2014-08-07 DIAGNOSIS — F17210 Nicotine dependence, cigarettes, uncomplicated: Secondary | ICD-10-CM | POA: Diagnosis present

## 2014-08-07 HISTORY — PX: WHIPPLE PROCEDURE: SHX2667

## 2014-08-07 LAB — COMPREHENSIVE METABOLIC PANEL
ALT: 18 U/L (ref 0–35)
AST: 29 U/L (ref 0–37)
Albumin: 3.2 g/dL — ABNORMAL LOW (ref 3.5–5.2)
Alkaline Phosphatase: 102 U/L (ref 39–117)
Anion gap: 10 (ref 5–15)
BUN: 14 mg/dL (ref 6–23)
CALCIUM: 9.6 mg/dL (ref 8.4–10.5)
CO2: 28 meq/L (ref 19–32)
CREATININE: 0.47 mg/dL — AB (ref 0.50–1.10)
Chloride: 103 mEq/L (ref 96–112)
Glucose, Bld: 90 mg/dL (ref 70–99)
Potassium: 3.8 mEq/L (ref 3.7–5.3)
SODIUM: 141 meq/L (ref 137–147)
TOTAL PROTEIN: 8.9 g/dL — AB (ref 6.0–8.3)
Total Bilirubin: 0.4 mg/dL (ref 0.3–1.2)

## 2014-08-07 LAB — CBC WITH DIFFERENTIAL/PLATELET
BASOS ABS: 0 10*3/uL (ref 0.0–0.1)
Basophils Relative: 1 % (ref 0–1)
EOS ABS: 0.1 10*3/uL (ref 0.0–0.7)
Eosinophils Relative: 3 % (ref 0–5)
HEMATOCRIT: 37.8 % (ref 36.0–46.0)
Hemoglobin: 12.4 g/dL (ref 12.0–15.0)
Lymphocytes Relative: 22 % (ref 12–46)
Lymphs Abs: 0.8 10*3/uL (ref 0.7–4.0)
MCH: 31.3 pg (ref 26.0–34.0)
MCHC: 32.8 g/dL (ref 30.0–36.0)
MCV: 95.5 fL (ref 78.0–100.0)
MONO ABS: 0.4 10*3/uL (ref 0.1–1.0)
Monocytes Relative: 11 % (ref 3–12)
Neutro Abs: 2.5 10*3/uL (ref 1.7–7.7)
Neutrophils Relative %: 63 % (ref 43–77)
PLATELETS: 245 10*3/uL (ref 150–400)
RBC: 3.96 MIL/uL (ref 3.87–5.11)
RDW: 13.7 % (ref 11.5–15.5)
WBC: 3.8 10*3/uL — ABNORMAL LOW (ref 4.0–10.5)

## 2014-08-07 LAB — GLUCOSE, CAPILLARY
GLUCOSE-CAPILLARY: 121 mg/dL — AB (ref 70–99)
GLUCOSE-CAPILLARY: 156 mg/dL — AB (ref 70–99)
Glucose-Capillary: 181 mg/dL — ABNORMAL HIGH (ref 70–99)
Glucose-Capillary: 141 mg/dL — ABNORMAL HIGH (ref 70–99)

## 2014-08-07 LAB — HEMOGLOBIN A1C
Hgb A1c MFr Bld: 6.1 % — ABNORMAL HIGH (ref <5.7)
MEAN PLASMA GLUCOSE: 128 mg/dL — AB (ref <117)

## 2014-08-07 LAB — SURGICAL PCR SCREEN
MRSA, PCR: NEGATIVE
Staphylococcus aureus: NEGATIVE

## 2014-08-07 SURGERY — WHIPPLE PROCEDURE
Anesthesia: General | Site: Abdomen

## 2014-08-07 MED ORDER — LIDOCAINE HCL (PF) 1 % IJ SOLN
INTRAMUSCULAR | Status: DC | PRN
  Administered 2014-08-07: 3 mL via SUBCUTANEOUS

## 2014-08-07 MED ORDER — ENOXAPARIN SODIUM 40 MG/0.4ML ~~LOC~~ SOLN
40.0000 mg | Freq: Every day | SUBCUTANEOUS | Status: DC
  Administered 2014-08-08 – 2014-08-18 (×11): 40 mg via SUBCUTANEOUS
  Filled 2014-08-07 (×12): qty 0.4

## 2014-08-07 MED ORDER — ACETAMINOPHEN 10 MG/ML IV SOLN
1000.0000 mg | Freq: Four times a day (QID) | INTRAVENOUS | Status: AC
  Administered 2014-08-07 – 2014-08-08 (×3): 1000 mg via INTRAVENOUS
  Filled 2014-08-07 (×7): qty 100

## 2014-08-07 MED ORDER — CETYLPYRIDINIUM CHLORIDE 0.05 % MT LIQD: 7.0000 mL | Freq: Two times a day (BID) | OROMUCOSAL | Status: DC

## 2014-08-07 MED ORDER — HYDROMORPHONE HCL 1 MG/ML IJ SOLN
INTRAMUSCULAR | Status: DC | PRN
  Administered 2014-08-07 (×2): 0.5 mg via INTRAVENOUS
  Administered 2014-08-07 (×2): 1 mg via INTRAVENOUS
  Administered 2014-08-07 (×2): 0.5 mg via INTRAVENOUS
  Administered 2014-08-07 (×2): 1 mg via INTRAVENOUS

## 2014-08-07 MED ORDER — SUCCINYLCHOLINE CHLORIDE 20 MG/ML IJ SOLN
INTRAMUSCULAR | Status: DC | PRN
  Administered 2014-08-07: 100 mg via INTRAVENOUS

## 2014-08-07 MED ORDER — BUPIVACAINE ON-Q PAIN PUMP (FOR ORDER SET NO CHG)
INJECTION | Status: AC
Start: 2014-08-07 — End: 2014-08-10
  Filled 2014-08-07: qty 1

## 2014-08-07 MED ORDER — PROMETHAZINE HCL 25 MG/ML IJ SOLN
6.2500 mg | INTRAMUSCULAR | Status: DC | PRN
  Administered 2014-08-20: 12.5 mg via INTRAVENOUS
  Filled 2014-08-07: qty 1

## 2014-08-07 MED ORDER — ONDANSETRON HCL 4 MG/2ML IJ SOLN: 4.0000 mg | Freq: Four times a day (QID) | INTRAMUSCULAR | Status: DC | PRN

## 2014-08-07 MED ORDER — LIDOCAINE HCL (CARDIAC) 20 MG/ML IV SOLN
INTRAVENOUS | Status: DC | PRN
  Administered 2014-08-07: 100 mg via INTRAVENOUS

## 2014-08-07 MED ORDER — HYDROMORPHONE HCL 1 MG/ML IJ SOLN
INTRAMUSCULAR | Status: AC
  Administered 2014-08-07: 0.5 mg via INTRAVENOUS
  Filled 2014-08-07: qty 1

## 2014-08-07 MED ORDER — NEOSTIGMINE METHYLSULFATE 10 MG/10ML IV SOLN
INTRAVENOUS | Status: DC | PRN
  Administered 2014-08-07: 5 mg via INTRAVENOUS

## 2014-08-07 MED ORDER — THROMBIN 5000 UNITS EX SOLR
Freq: Once | CUTANEOUS | Status: DC
  Filled 2014-08-07: qty 5000

## 2014-08-07 MED ORDER — LACTATED RINGERS IV SOLN
INTRAVENOUS | Status: DC
  Administered 2014-08-07: 1000 mL via INTRAVENOUS

## 2014-08-07 MED ORDER — KCL IN DEXTROSE-NACL 20-5-0.45 MEQ/L-%-% IV SOLN
INTRAVENOUS | Status: AC
  Filled 2014-08-07: qty 1000

## 2014-08-07 MED ORDER — TISSEEL VH 10 ML EX KIT
PACK | CUTANEOUS | Status: DC | PRN
  Administered 2014-08-07: 10 mL

## 2014-08-07 MED ORDER — LACTATED RINGERS IV SOLN
INTRAVENOUS | Status: DC | PRN
  Administered 2014-08-07 (×3): via INTRAVENOUS

## 2014-08-07 MED ORDER — CHLORHEXIDINE GLUCONATE 4 % EX LIQD
Freq: Once | CUTANEOUS | Status: AC
  Administered 2014-08-07: 02:00:00 via TOPICAL
  Filled 2014-08-07: qty 15

## 2014-08-07 MED ORDER — CEFAZOLIN SODIUM-DEXTROSE 2-3 GM-% IV SOLR
INTRAVENOUS | Status: DC | PRN
  Administered 2014-08-07 (×2): 2 g via INTRAVENOUS

## 2014-08-07 MED ORDER — INSULIN ASPART 100 UNIT/ML ~~LOC~~ SOLN
0.0000 [IU] | SUBCUTANEOUS | Status: DC
  Administered 2014-08-07: 2 [IU] via SUBCUTANEOUS
  Administered 2014-08-07: 1 [IU] via SUBCUTANEOUS
  Administered 2014-08-08: 4 [IU] via SUBCUTANEOUS
  Administered 2014-08-08 (×2): 1 [IU] via SUBCUTANEOUS

## 2014-08-07 MED ORDER — HYDROMORPHONE 0.3 MG/ML IV SOLN
INTRAVENOUS | Status: DC
  Administered 2014-08-07: 0.3 mg via INTRAVENOUS
  Administered 2014-08-07: 0.9 mg via INTRAVENOUS
  Administered 2014-08-08: 0.6 mg via INTRAVENOUS
  Administered 2014-08-08: 2.7 mg via INTRAVENOUS
  Administered 2014-08-08: 0.9 mg via INTRAVENOUS
  Administered 2014-08-08: 0.3 mg via INTRAVENOUS

## 2014-08-07 MED ORDER — ONDANSETRON HCL 4 MG PO TABS
4.0000 mg | ORAL_TABLET | Freq: Four times a day (QID) | ORAL | Status: DC | PRN
  Administered 2014-08-21: 4 mg via ORAL
  Filled 2014-08-07: qty 1

## 2014-08-07 MED ORDER — SODIUM CHLORIDE 0.9 % IJ SOLN: 9.0000 mL | INTRAMUSCULAR | Status: DC | PRN

## 2014-08-07 MED ORDER — PHENYLEPHRINE HCL 10 MG/ML IJ SOLN
10.0000 mg | INTRAVENOUS | Status: DC | PRN
  Administered 2014-08-07: 50 ug/min via INTRAVENOUS

## 2014-08-07 MED ORDER — MIDAZOLAM HCL 5 MG/5ML IJ SOLN
INTRAMUSCULAR | Status: DC | PRN
  Administered 2014-08-07: 2 mg via INTRAVENOUS

## 2014-08-07 MED ORDER — HYDROMORPHONE HCL 1 MG/ML IJ SOLN
INTRAMUSCULAR | Status: AC
  Filled 2014-08-07: qty 1

## 2014-08-07 MED ORDER — ROCURONIUM BROMIDE 100 MG/10ML IV SOLN
INTRAVENOUS | Status: DC | PRN
  Administered 2014-08-07 (×3): 10 mg via INTRAVENOUS
  Administered 2014-08-07: 40 mg via INTRAVENOUS
  Administered 2014-08-07: 20 mg via INTRAVENOUS
  Administered 2014-08-07 (×2): 10 mg via INTRAVENOUS

## 2014-08-07 MED ORDER — CEFAZOLIN SODIUM 1-5 GM-% IV SOLN
1.0000 g | Freq: Four times a day (QID) | INTRAVENOUS | Status: AC
  Administered 2014-08-07 – 2014-08-08 (×2): 1 g via INTRAVENOUS
  Filled 2014-08-07 (×3): qty 50

## 2014-08-07 MED ORDER — ONDANSETRON HCL 4 MG/2ML IJ SOLN
INTRAMUSCULAR | Status: DC | PRN
  Administered 2014-08-07: 4 mg via INTRAVENOUS

## 2014-08-07 MED ORDER — SODIUM CHLORIDE 0.9 % IV SOLN: INTRAVENOUS | Status: DC | PRN

## 2014-08-07 MED ORDER — BUPIVACAINE 0.25 % ON-Q PUMP DUAL CATH 300 ML
300.0000 mL | INJECTION | Status: DC
  Administered 2014-08-07: 300 mL
  Filled 2014-08-07: qty 300

## 2014-08-07 MED ORDER — DEXAMETHASONE SODIUM PHOSPHATE 10 MG/ML IJ SOLN
INTRAMUSCULAR | Status: DC | PRN
  Administered 2014-08-07: 10 mg via INTRAVENOUS

## 2014-08-07 MED ORDER — BUPIVACAINE-EPINEPHRINE 0.25% -1:200000 IJ SOLN
INTRAMUSCULAR | Status: DC | PRN
  Administered 2014-08-07: 3 mL

## 2014-08-07 MED ORDER — FENTANYL CITRATE 0.05 MG/ML IJ SOLN
INTRAMUSCULAR | Status: DC | PRN
  Administered 2014-08-07 (×5): 50 ug via INTRAVENOUS

## 2014-08-07 MED ORDER — DEXTROSE 5 % IV SOLN: 2.0000 g | INTRAVENOUS | Status: DC

## 2014-08-07 MED ORDER — DEXTROSE 5 % IV SOLN
INTRAVENOUS | Status: AC
  Filled 2014-08-07: qty 2

## 2014-08-07 MED ORDER — GLYCOPYRROLATE 0.2 MG/ML IJ SOLN
INTRAMUSCULAR | Status: DC | PRN
  Administered 2014-08-07: 0.6 mg via INTRAVENOUS

## 2014-08-07 MED ORDER — HYDROMORPHONE HCL 1 MG/ML IJ SOLN
0.2500 mg | INTRAMUSCULAR | Status: DC | PRN
  Administered 2014-08-07 (×4): 0.5 mg via INTRAVENOUS

## 2014-08-07 MED ORDER — LACTATED RINGERS IV SOLN
INTRAVENOUS | Status: DC | PRN
Start: 2014-08-07 — End: 2014-08-07
  Administered 2014-08-07 (×3): via INTRAVENOUS

## 2014-08-07 MED ORDER — DIPHENHYDRAMINE HCL 12.5 MG/5ML PO ELIX: 12.5000 mg | ORAL_SOLUTION | Freq: Four times a day (QID) | ORAL | Status: DC | PRN

## 2014-08-07 MED ORDER — HYDROMORPHONE 0.3 MG/ML IV SOLN
INTRAVENOUS | Status: AC
  Administered 2014-08-07: 0.3 mg via INTRAVENOUS
  Filled 2014-08-07: qty 25

## 2014-08-07 MED ORDER — PROPOFOL 10 MG/ML IV BOLUS
INTRAVENOUS | Status: DC | PRN
  Administered 2014-08-07: 110 mg via INTRAVENOUS

## 2014-08-07 MED ORDER — NALOXONE HCL 0.4 MG/ML IJ SOLN: 0.4000 mg | INTRAMUSCULAR | Status: DC | PRN

## 2014-08-07 MED ORDER — DIPHENHYDRAMINE HCL 50 MG/ML IJ SOLN: 12.5000 mg | Freq: Four times a day (QID) | INTRAMUSCULAR | Status: DC | PRN

## 2014-08-07 SURGICAL SUPPLY — 107 items
BLADE EXTENDED COATED 6.5IN (ELECTRODE) ×3 IMPLANT
BLADE HEX COATED 2.75 (ELECTRODE) ×3 IMPLANT
BLADE SURG SZ10 CARB STEEL (BLADE) ×3 IMPLANT
BOOT SUTURE VASCULAR YLW (MISCELLANEOUS) ×2
CABLE HIGH FREQUENCY MONO STRZ (ELECTRODE) IMPLANT
CANISTER SUCTION 2500CC (MISCELLANEOUS) ×3 IMPLANT
CATH FOLEY 2WAY SLVR  5CC 16FR (CATHETERS) ×2
CATH FOLEY 2WAY SLVR 5CC 16FR (CATHETERS) ×1 IMPLANT
CATH KIT ON Q 7.5IN SLV (PAIN MANAGEMENT) ×6 IMPLANT
CATH ROBINSON RED A/P 12FR (CATHETERS) IMPLANT
CATH ROBINSON RED A/P 14FR (CATHETERS) IMPLANT
CATH ROBINSON RED A/P 16FR (CATHETERS) IMPLANT
CATH ROBINSON RED A/P 18FR (CATHETERS) IMPLANT
CATH ROBINSON RED A/P 20FR (CATHETERS) IMPLANT
CHLORAPREP W/TINT 26ML (MISCELLANEOUS) ×3 IMPLANT
CLAMP SUTURE YELLOW 5 PAIRS (MISCELLANEOUS) ×1 IMPLANT
CLIP LIGATING HEM O LOK PURPLE (MISCELLANEOUS) ×9 IMPLANT
CLIP LIGATING HEMO O LOK GREEN (MISCELLANEOUS) ×6 IMPLANT
CLIP LIGATING HEMOLOK MED (MISCELLANEOUS) ×3 IMPLANT
CLIP TI LARGE 6 (CLIP) ×3 IMPLANT
CLIP TI MEDIUM 6 (CLIP) ×6 IMPLANT
CUTTER FLEX LINEAR 45M (STAPLE) IMPLANT
DECANTER SPIKE VIAL GLASS SM (MISCELLANEOUS) ×3 IMPLANT
DISSECTOR ROUND CHERRY 3/8 STR (MISCELLANEOUS) IMPLANT
DRAIN CHANNEL 19F RND (DRAIN) ×6 IMPLANT
DRAPE C-ARM 42X120 X-RAY (DRAPES) IMPLANT
DRAPE CAMERA CLOSED 9X96 (DRAPES) IMPLANT
DRAPE UNIVERSAL PACK (DRAPES) ×3 IMPLANT
DRAPE UTILITY XL STRL (DRAPES) IMPLANT
DRAPE WARM FLUID 44X44 (DRAPE) ×9 IMPLANT
DRESSING TELFA ISLAND 4X8 (GAUZE/BANDAGES/DRESSINGS) ×3 IMPLANT
DRSG PAD ABDOMINAL 8X10 ST (GAUZE/BANDAGES/DRESSINGS) IMPLANT
DRSG TELFA 4X10 ISLAND STR (GAUZE/BANDAGES/DRESSINGS) IMPLANT
DRSG TELFA PLUS 4X6 ADH ISLAND (GAUZE/BANDAGES/DRESSINGS) IMPLANT
ENDOLOOP SUT PDS II  0 18 (SUTURE)
ENDOLOOP SUT PDS II 0 18 (SUTURE) IMPLANT
EVACUATOR SILICONE 100CC (DRAIN) ×6 IMPLANT
GAUZE SPONGE 4X4 12PLY STRL (GAUZE/BANDAGES/DRESSINGS) IMPLANT
GLOVE BIO SURGEON STRL SZ 6 (GLOVE) ×12 IMPLANT
GLOVE INDICATOR 6.5 STRL GRN (GLOVE) ×12 IMPLANT
GOWN STRL REUS W/TWL 2XL LVL3 (GOWN DISPOSABLE) ×9 IMPLANT
GOWN STRL REUS W/TWL XL LVL3 (GOWN DISPOSABLE) ×18 IMPLANT
HEMOSTAT SURGICEL 4X8 (HEMOSTASIS) IMPLANT
KIT BASIN OR (CUSTOM PROCEDURE TRAY) ×6 IMPLANT
KIT MARKER MARGIN INK (KITS) ×3 IMPLANT
LOOP MINI RED (MISCELLANEOUS) IMPLANT
LOOP VESSEL MAXI BLUE (MISCELLANEOUS) IMPLANT
NEEDLE BIOPSY 14GX4.5 SOFT TIS (NEEDLE) IMPLANT
NEEDLE HYPO 22GX1.5 SAFETY (NEEDLE) ×6 IMPLANT
PACK GENERAL/GYN (CUSTOM PROCEDURE TRAY) ×3 IMPLANT
PLUG CATH AND CAP STER (CATHETERS) IMPLANT
RELOAD PROXIMATE 75MM BLUE (ENDOMECHANICALS) ×9 IMPLANT
SEPRAFILM PROCEDURAL PACK 3X5 (MISCELLANEOUS) IMPLANT
SHEARS FOC LG CVD HARMONIC 17C (MISCELLANEOUS) ×6 IMPLANT
SLEEVE SURGEON STRL (DRAPES) ×3 IMPLANT
SLEEVE XCEL OPT CAN 5 100 (ENDOMECHANICALS) ×3 IMPLANT
SPONGE DRAIN TRACH 4X4 STRL 2S (GAUZE/BANDAGES/DRESSINGS) IMPLANT
SPONGE LAP 18X18 X RAY DECT (DISPOSABLE) ×12 IMPLANT
STAPLER PROXIMATE 75MM BLUE (STAPLE) ×3 IMPLANT
STAPLER VISISTAT 35W (STAPLE) IMPLANT
SUCTION POOLE TIP (SUCTIONS) ×3 IMPLANT
SUT 5.0 PDS RB-1 (SUTURE) ×6
SUT CHROMIC 3 0 SH 27 (SUTURE) IMPLANT
SUT CHROMIC 4 0 RB 1X27 (SUTURE) IMPLANT
SUT ETHILON 1 TP 1 60 (SUTURE) IMPLANT
SUT ETHILON 2 0 PS N (SUTURE) ×6 IMPLANT
SUT MNCRL AB 4-0 PS2 18 (SUTURE) ×6 IMPLANT
SUT PDS AB 1 TP1 54 (SUTURE) ×6 IMPLANT
SUT PDS AB 1 TP1 96 (SUTURE) ×6 IMPLANT
SUT PDS AB 3-0 SH 27 (SUTURE) ×12 IMPLANT
SUT PDS AB 4-0 RB1 27 (SUTURE) ×39 IMPLANT
SUT PDS PLUS AB 5-0 RB-1 (SUTURE) ×3 IMPLANT
SUT PROLENE 3 0 SH 48 (SUTURE) IMPLANT
SUT PROLENE 3 0 SH1 36 (SUTURE) ×6 IMPLANT
SUT PROLENE 4 0 RB 1 (SUTURE) ×8
SUT PROLENE 4-0 RB1 .5 CRCL 36 (SUTURE) ×4 IMPLANT
SUT PROLENE 5 0 CC 1 (SUTURE) IMPLANT
SUT SILK 0 FSL (SUTURE) ×3 IMPLANT
SUT SILK 2 0 (SUTURE) ×2
SUT SILK 2 0 SH CR/8 (SUTURE) ×3 IMPLANT
SUT SILK 2-0 18XBRD TIE 12 (SUTURE) ×1 IMPLANT
SUT SILK 3 0 (SUTURE)
SUT SILK 3 0 SH CR/8 (SUTURE) IMPLANT
SUT SILK 3-0 18XBRD TIE 12 (SUTURE) IMPLANT
SUT VIC AB 2-0 SH 27 (SUTURE) ×2
SUT VIC AB 2-0 SH 27X BRD (SUTURE) ×1 IMPLANT
SUT VIC AB 3-0 SH 18 (SUTURE) IMPLANT
SUT VIC AB 4-0 SH 18 (SUTURE) IMPLANT
SUT VICRYL 2 0 18  UND BR (SUTURE) ×2
SUT VICRYL 2 0 18 UND BR (SUTURE) ×1 IMPLANT
SUT VICRYL 3 0 BR 18  UND (SUTURE) ×2
SUT VICRYL 3 0 BR 18 UND (SUTURE) ×1 IMPLANT
SYR 20CC LL (SYRINGE) IMPLANT
SYR CONTROL 10ML LL (SYRINGE) IMPLANT
SYS LAPSCP GELPORT 120MM (MISCELLANEOUS)
SYSTEM LAPSCP GELPORT 120MM (MISCELLANEOUS) IMPLANT
TAG SUTURE CLAMP YLW 5PR (MISCELLANEOUS) ×1
TAPE UMBILICAL COTTON 1/8X30 (MISCELLANEOUS) ×3 IMPLANT
TOWEL OR 17X26 10 PK STRL BLUE (TOWEL DISPOSABLE) ×6 IMPLANT
TOWEL OR NON WOVEN STRL DISP B (DISPOSABLE) ×6 IMPLANT
TRAY FOLEY CATH 14FRSI W/METER (CATHETERS) ×3 IMPLANT
TROCAR XCEL NON-BLD 5MMX100MML (ENDOMECHANICALS) ×3 IMPLANT
TUBE FEEDING 5FR 36IN KANGAROO (TUBING) IMPLANT
TUBE FEEDING 8FR 16IN STR KANG (MISCELLANEOUS) IMPLANT
TUBING INSUFFLATION 10FT LAP (TUBING) IMPLANT
TUNNELER SHEATH ON-Q 16GX12 DP (PAIN MANAGEMENT) ×3 IMPLANT
YANKAUER SUCT BULB TIP NO VENT (SUCTIONS) ×3 IMPLANT

## 2014-08-07 NOTE — Plan of Care (Signed)
Problem: Consults Goal: General Surgical Patient Education (See Patient Education module for education specifics) Outcome: Progressing  Problem: Phase I Progression Outcomes Goal: OOB as tolerated unless otherwise ordered Outcome: Completed/Met Date Met:  08/07/14

## 2014-08-07 NOTE — Plan of Care (Signed)
Problem: Phase I Progression Outcomes Goal: Tubes/drains patent Outcome: Progressing Goal: Vital signs/hemodynamically stable Outcome: Progressing

## 2014-08-07 NOTE — Progress Notes (Signed)
Patient still in [place] given to Carroll Sage RN.- Sandie Ano RN

## 2014-08-07 NOTE — Op Note (Signed)
PREOPERATIVE DIAGNOSIS: Stage IIa (T3N0) adenocarcinoma of the pancreatic head, s/p neoadjuvant chemoradiation  POSTOPERATIVE DIAGNOSIS: Same.   PROCEDURES PERFORMED:  Diagnostic laparoscopy  Classic pancreaticoduodenectomy   Placement of pancreatic stent    SURGEON: Stark Klein, MD   ASSISTANT: Jackolyn Confer, MD   ANESTHESIA: General and epidural   FINDINGS: 3 cm pancreatic head mass. Firm pancreatic tissue. 12 mm common bile duct. 3 mm pancreatic duct  SPECIMENS:  1. Pancreaticoduodenectomy with gallbladder:  2. Hepatic artery nodes   ESTIMATED BLOOD LOSS: 350 mL.   COMPLICATIONS: None known.   PROCEDURE:   Pt was identified in the holding area and taken to  the operating room, and placed supine on the operating room  table. General anesthesia was induced. The patient's abdomen was  prepped and draped in a sterile fashion, after a Foley catheter was  placed. A time-out was performed according to the surgical safety check  list. When all was correct we continued.   The patient was placed in reverse trendelenburg position and rotated to the right.  The left subcostal margin was anesthetized with local anesthesia.  A 5 mm optiview trocar was placed under direct visualization.  The abdomen was insufflated with carbon dioxide.  The abdomen was examined.  A second port was placed in the upper midline to be able to better visualize the right liver.  No evidence of metastatic disease was seen.    A midline incision was made from the xiphoid to just below the umbilicus. The subcutaneous tissues were divided with the Bovie cautery. The peritoneum was entered in the center of the abdomen. Digital retraction was then used to elevate the preperitoneal fat, and this was taken with the cautery as well. Care was taken to protect the underlying viscera. Adhesions were taken down from the abdominal wall.     The Bookwalter self-retaining retractor was placed  for visualization. The mass  was mobile.   The porta was identified. The  duodenum was kocherized extensively with blunt dissection and with cautery. The gallbladder was taken off the liver with a combination of blunt dissection and cautery. The cystic duct was clipped with the Hemaclips. The cystic duct was divided, suture ligated, and the gallbladder was passed off.   The porta had significant inflammatory change around it.  The common bile duct was skeletonized near the duodenum. The retraction pulled the common duct at the duodenum near the tumor and a small leak of bile was seen in the area to be removed.   A vessel loop was passed around the duct. The gastroduodenal artery, as well as the common hepatic artery were skeletonized. The proper hepatic artery was traced out to make sure that flow was going to both sides of the liver when the GDA was clamped. The GDA was test clamped with the bulldog, with good flow to the liver and  no signs of ischemia. This was divided with 2-0 silk ties and then clipped. The proper hepatic artery was reflected upward, and the anterior portal vein was exposed.  A Kelly clamp was passed underneath the pancreas at the superior mesenteric vein, and this passed  easily with no signs of tumor involvement.   Attention was then directed to the stomach, and the omentum was taken  off of the stomach at the border of the antrum and the body. The  gastrohepatic ligament was taken down with the harmonic, and care was  taken to make sure there was not a replaced left hepatic artery in  this  location. The stomach was divided with the GIA-75 stapler. The border  of the stomach was oversewn with a 3-0 running PDS suture.   Attention was then directed to the small bowel. Around 10 cm past the  ligament of Treitz it was located, and this was divided with the 75-GIA.  The distal portion of the jejunum was also oversewn with a 3-0 PDS  suture. The fourth portion of the duodenum was skeletonized with the   harmonic scalpel, taking down all of the mesenteric vessels. The  ligament of Treitz was taken down. The IMV was preserved.  The duodenum was then passed underneath the portal vein.   At this point the Claiborne Billings was replaced and the pancreas was divided with the cautery.  The Bovie was used to coagulate the small bleeders at the border of the pancreas.  The Overholt in combination with the harmonic, metal clips and locking Weck clips  were then used to take the uncinate process off of the portal vein and  the superior mesenteric artery. The tumor was stuck to the portal vein, but this was inflammatory, as careful and slow dissection was able to separate it off.  Care was taken not to incorporate the  superior mesenteric artery in the dissection. The specimen was then marked and passed off the table for frozen section margin.   The jejunum was then passed underneath  the SMV, in order to get appropriate lie for the pancreatic and biliary  anastomoses. The more distal portion of the jejunum was pulled up over  the colon, and two 3-0 silks were placed through the posterior border  of the stomach for the gastrojejunostomy. The stomach and the small  bowel were opened, and a GIA-75 was used to create an end-to-end  anastomosis. The open areas of the staple line were examined to ensure  that there was hemostasis. The defect was then closed with a single  layer of running Connell suture of 3-0 PDS. Prior to a complete  closure, the NG tube was passed toward the afferent limb.   The appropriate location for the choledochojejunostomy was identified, and  the small bowel was opened approximately 12 mm. The anastamosis was created with approximately thirteen 4-0 interrupted PDS sutures.   The 2 corner sutures were placed first  and then the posterior layer was done in an interrupted fashion tying on  the inside. The superior layer was then closed with interrupted sutures as  well.   At this point the  frozens returned back as all negative. The pancreatic  anastomosis was then created by opening the muscular layer of the jejunum the length  of the pancreatic parenchyma. The pancreas was firm, and the duct was 3 mm. A pediatric feeding tube was used as a pancreatic stent. The posterior layer was formed first with 2-0 silk sutures in interrupted fashion.  Five 5-0 PDS sutures were used to create the pancreatic duct to mucosa anastamosis.  The anterior layer was then oversewn with 2-0 silks to dunk the pancreatic parenchyma.   The areas were then irrigated and then those anastomoses were covered  with Tisseal. This was allowed to dry. The omentum was placed in the RUQ and wrapped around the anastamosis The abdomen was then irrigated  again and all the laparotomy sponges were removed. A lap count was  performed, which was correct. Two 19-Blake drains were placed, with the  medial-most drain placed behind the choledochojejunostomy. The lateral Blake drain was placed just anterior  and slightly superior to the  pancreaticojejunostomy. The OnQ tunnelers were placed along either side of the fascial incision in the preperitoneal space.  The fascia was then closed with #1 looped running PDS sutures. The skin was irrigated and then closed with  staples. The wounds were cleaned, dried and dressed with a sterile  dressing.   The patient tolerated the procedure well and was extubated and taken to  PACU in stable condition. Needle and sponge counts were correct x2.

## 2014-08-07 NOTE — Anesthesia Postprocedure Evaluation (Signed)
  Anesthesia Post-op Note  Patient: Melinda Conrad  Procedure(s) Performed: Procedure(s) (LRB):  DIAGNOSTIC LAPAROSCOPY, WHIPPLE PROCEDURE (N/A)  Patient Location: PACU  Anesthesia Type: General  Level of Consciousness: awake and alert   Airway and Oxygen Therapy: Patient Spontanous Breathing  Post-op Pain: mild  Post-op Assessment: Post-op Vital signs reviewed, Patient's Cardiovascular Status Stable, Respiratory Function Stable, Patent Airway and No signs of Nausea or vomiting  Last Vitals:  Filed Vitals:   08/07/14 1600  BP:   Pulse:   Temp: 36.8 C  Resp:     Post-op Vital Signs: stable   Complications: No apparent anesthesia complications

## 2014-08-07 NOTE — Progress Notes (Signed)
eLink Physician-Brief Progress Note Patient Name: [name] DOB: 1950-12-08 MRN: [id number]   Date of Service  08/07/2014  HPI/Events of Note  HPI: 63 yo AAF with pancreatic cancer scheduled for Whipple procedure in the am by Dr Barry Dienes comes in to ED c/o worsening abd and back pain and nausea/vomiting. Pain has gotten worse and started vomiting earlier today. Has chronic nausea. Pt unaware that she had preop appts - forgot. Been staying with her daughter. Now s\p Whipple procedure  PROCEDURES PERFORMED:   Diagnostic laparoscopy  Classic pancreaticoduodenectomy    Placement of pancreatic stent    SURGEON: Stark Klein, MD   ASSISTANT: Jackolyn Confer, MD   ANESTHESIA: General and epidural   FINDINGS: 3 cm pancreatic head mass. Firm pancreatic tissue. 12 mm common bile duct. 3 mm pancreatic duct  SPECIMENS:   1. Pancreaticoduodenectomy with gallbladder:   2. Hepatic artery nodes  History from chart review.   eICU Interventions  63 yo with PMHx of pancreatic cancer, now s\p whipple  1. Pancreatic ca - s\p whipple - monitor I\Os - monitor tube output - follow up labs - pain control per surgery     Intervention Category Evaluation Type: New Patient Evaluation  Cozetta Seif 08/07/2014, 3:56 PM

## 2014-08-07 NOTE — Interval H&P Note (Signed)
History and Physical Interval Note:  08/07/2014 8:00 AM  Melinda Conrad  has presented today for surgery, with the diagnosis of pancreatic cancer  The various methods of treatment have been discussed with the patient and family. After consideration of risks, benefits and other options for treatment, the patient has consented to  Procedure(s):  DIAGNOSTIC LAPAROSCOPY, POSIBLE WHIPPLE PROCEDURE (N/A) as a surgical intervention .  The patient's history has been reviewed, patient examined, no change in status, stable for surgery.  I have reviewed the patient's chart and labs.  Questions were answered to the patient's satisfaction.     Melinda Conrad

## 2014-08-07 NOTE — Transfer of Care (Signed)
Immediate Anesthesia Transfer of Care Note  Patient: Melinda Conrad  Procedure(s) Performed: Procedure(s) (LRB):  DIAGNOSTIC LAPAROSCOPY, WHIPPLE PROCEDURE (N/A)  Patient Location: PACU  Anesthesia Type: General  Level of Consciousness: sedated, patient cooperative and responds to stimulation  Airway & Oxygen Therapy: Patient Spontanous Breathing and Patient connected to face mask oxgen  Post-op Assessment: Report given to PACU RN and Post -op Vital signs reviewed and stable  Post vital signs: Reviewed and stable  Complications: No apparent anesthesia complications

## 2014-08-08 DIAGNOSIS — E43 Unspecified severe protein-calorie malnutrition: Secondary | ICD-10-CM | POA: Insufficient documentation

## 2014-08-08 LAB — COMPREHENSIVE METABOLIC PANEL
ALBUMIN: 1.8 g/dL — AB (ref 3.5–5.2)
ALK PHOS: 64 U/L (ref 39–117)
ALT: 105 U/L — ABNORMAL HIGH (ref 0–35)
AST: 260 U/L — ABNORMAL HIGH (ref 0–37)
Anion gap: 11 (ref 5–15)
BILIRUBIN TOTAL: 0.5 mg/dL (ref 0.3–1.2)
BUN: 27 mg/dL — AB (ref 6–23)
CO2: 21 mEq/L (ref 19–32)
CREATININE: 0.91 mg/dL (ref 0.50–1.10)
Calcium: 8.3 mg/dL — ABNORMAL LOW (ref 8.4–10.5)
Chloride: 104 mEq/L (ref 96–112)
GFR calc non Af Amer: 67 mL/min — ABNORMAL LOW (ref >90)
GFR, EST AFRICAN AMERICAN: 77 mL/min — AB (ref >90)
GLUCOSE: 124 mg/dL — AB (ref 70–99)
POTASSIUM: 4.7 meq/L (ref 3.7–5.3)
Sodium: 136 mEq/L — ABNORMAL LOW (ref 137–147)
TOTAL PROTEIN: 5.6 g/dL — AB (ref 6.0–8.3)

## 2014-08-08 LAB — PROTIME-INR
INR: 1.13 (ref 0.00–1.49)
PROTHROMBIN TIME: 14.7 s (ref 11.6–15.2)

## 2014-08-08 LAB — CBC
HCT: 28 % — ABNORMAL LOW (ref 36.0–46.0)
HEMOGLOBIN: 9.5 g/dL — AB (ref 12.0–15.0)
MCH: 31.7 pg (ref 26.0–34.0)
MCHC: 33.9 g/dL (ref 30.0–36.0)
MCV: 93.3 fL (ref 78.0–100.0)
Platelets: 203 10*3/uL (ref 150–400)
RBC: 3 MIL/uL — ABNORMAL LOW (ref 3.87–5.11)
RDW: 13.7 % (ref 11.5–15.5)
WBC: 9.4 10*3/uL (ref 4.0–10.5)

## 2014-08-08 LAB — GLUCOSE, CAPILLARY
GLUCOSE-CAPILLARY: 108 mg/dL — AB (ref 70–99)
Glucose-Capillary: 133 mg/dL — ABNORMAL HIGH (ref 70–99)
Glucose-Capillary: 127 mg/dL — ABNORMAL HIGH (ref 70–99)
Glucose-Capillary: 109 mg/dL — ABNORMAL HIGH (ref 70–99)
Glucose-Capillary: 92 mg/dL (ref 70–99)

## 2014-08-08 LAB — MAGNESIUM: Magnesium: 1.4 mg/dL — ABNORMAL LOW (ref 1.5–2.5)

## 2014-08-08 LAB — PHOSPHORUS: Phosphorus: 4.4 mg/dL (ref 2.3–4.6)

## 2014-08-08 MED ORDER — NALOXONE HCL 0.4 MG/ML IJ SOLN: 0.4000 mg | INTRAMUSCULAR | Status: DC | PRN

## 2014-08-08 MED ORDER — DIPHENHYDRAMINE HCL 12.5 MG/5ML PO ELIX: 12.5000 mg | ORAL_SOLUTION | Freq: Four times a day (QID) | ORAL | Status: DC | PRN

## 2014-08-08 MED ORDER — ONDANSETRON HCL 4 MG/2ML IJ SOLN: 4.0000 mg | Freq: Four times a day (QID) | INTRAMUSCULAR | Status: DC | PRN

## 2014-08-08 MED ORDER — SODIUM CHLORIDE 0.9 % IJ SOLN: 9.0000 mL | INTRAMUSCULAR | Status: DC | PRN

## 2014-08-08 MED ORDER — ALBUMIN HUMAN 5 % IV SOLN
25.0000 g | Freq: Once | INTRAVENOUS | Status: AC
  Administered 2014-08-08: 25 g via INTRAVENOUS
  Filled 2014-08-08: qty 500

## 2014-08-08 MED ORDER — MAGNESIUM SULFATE 4 GM/100ML IV SOLN
4.0000 g | Freq: Once | INTRAVENOUS | Status: AC
Start: 2014-08-08 — End: 2014-08-08
  Administered 2014-08-08: 4 g via INTRAVENOUS
  Filled 2014-08-08: qty 100

## 2014-08-08 MED ORDER — HYDROMORPHONE 0.3 MG/ML IV SOLN
INTRAVENOUS | Status: DC
  Administered 2014-08-08: 0.5 mg via INTRAVENOUS
  Administered 2014-08-08 – 2014-08-09 (×2): 1 mg via INTRAVENOUS
  Administered 2014-08-09 (×2): 0.5 mg via INTRAVENOUS
  Administered 2014-08-09: 0.499 mg via INTRAVENOUS
  Administered 2014-08-09: 22:00:00 via INTRAVENOUS
  Administered 2014-08-09: 1.99 mg via INTRAVENOUS
  Administered 2014-08-10: 1.49 mg via INTRAVENOUS
  Administered 2014-08-10: 3.49 mg via INTRAVENOUS
  Administered 2014-08-10: 1.5 mg via INTRAVENOUS
  Administered 2014-08-10: 13:00:00 via INTRAVENOUS
  Administered 2014-08-10: 2.99 mg via INTRAVENOUS
  Administered 2014-08-11: 1.69 mg via INTRAVENOUS
  Administered 2014-08-11: 1.5 mg via INTRAVENOUS
  Administered 2014-08-11: 22:00:00 via INTRAVENOUS
  Administered 2014-08-11: 2 mg via INTRAVENOUS
  Administered 2014-08-11: 0.499 mg via INTRAVENOUS
  Administered 2014-08-11: 06:00:00 via INTRAVENOUS
  Administered 2014-08-11: 1.99 mg via INTRAVENOUS
  Administered 2014-08-12: 1.5 mg via INTRAVENOUS
  Administered 2014-08-12: 1.8 mg via INTRAVENOUS
  Administered 2014-08-12: 2.49 mg via INTRAVENOUS
  Administered 2014-08-12: 2.56 mg via INTRAVENOUS
  Administered 2014-08-13: 2.99 mg via INTRAVENOUS
  Administered 2014-08-13: 18:00:00 via INTRAVENOUS
  Administered 2014-08-13: 2.99 mg via INTRAVENOUS
  Administered 2014-08-13: 1.7 mL via INTRAVENOUS
  Administered 2014-08-13: 2.99 mg via INTRAVENOUS
  Administered 2014-08-13: 2.49 mg via INTRAVENOUS
  Administered 2014-08-14: 2.99 mg via INTRAVENOUS
  Administered 2014-08-14: 06:00:00 via INTRAVENOUS
  Administered 2014-08-14: 4.99 mg via INTRAVENOUS
  Filled 2014-08-08 (×9): qty 25

## 2014-08-08 MED ORDER — KCL IN DEXTROSE-NACL 10-5-0.45 MEQ/L-%-% IV SOLN
INTRAVENOUS | Status: DC
  Administered 2014-08-08 – 2014-08-09 (×4): via INTRAVENOUS
  Administered 2014-08-10: 100 mL via INTRAVENOUS
  Administered 2014-08-10 – 2014-08-11 (×3): via INTRAVENOUS
  Filled 2014-08-08 (×13): qty 1000

## 2014-08-08 MED ORDER — DIPHENHYDRAMINE HCL 50 MG/ML IJ SOLN: 12.5000 mg | Freq: Four times a day (QID) | INTRAMUSCULAR | Status: DC | PRN

## 2014-08-08 NOTE — Progress Notes (Addendum)
INITIAL NUTRITION ASSESSMENT  DOCUMENTATION CODES Per approved criteria  -Severe malnutrition in the context of chronic illness  Pt meets criteria for severe MALNUTRITION in the context of chronic illness as evidenced by 20% body weight loss in < one year, PO intake < 75% for > one month, severe muscle wasting and subcutaneous fat loss.   INTERVENTION: -Per IMPACT Nutritin Study Guidelines  -Recommend IMPACT SUPPLEMENT po TID for five days post-op as tolerated, each supplement provides 340 kcal and 9 grams of protein  -May utilize IMPACT supplement for enteral nutrition as warranted -RD to educate on post-op diet when diet advanced, will provide nutrition education handouts and review recommendations -RD to continue to monitor   NUTRITION DIAGNOSIS: Inadequate oral intake related to nausea/vomiting/abd pain as evidenced by PO intake < 75%, 30 lb weight loss in < one year.   Goal: Pt to meet >/= 90% of their estimated nutrition needs    Monitor:  Diet order, total protein/energy intake, labs, weights, GI profile  Reason for Assessment: MST  63 y.o. female  Admitting Dx: <principal problem not specified>  ASSESSMENT: 63 yo AAF with pancreatic cancer scheduled for Whipple procedure in the am by Dr Barry Dienes comes in to ED c/o worsening abd and back pain and nausea/vomiting  -Pt being followed by outpatient oncology RD since 04/2014. Was started on Impact IR nutrition study, whereas she was instructed to consume Impact nutritional supplement TID for 5 days prior to Whipple procedure. Pt reported being able to consume 9 out of 15 supplements, equally approximately 2 supplements daily. Diet recall indicated pt continued with decreased appetite, but was able to tolerate baked chicken, fish, and turnip greens. Tried to increase caloric intake by consuming higher fat foods (mac and cheese); however this resulted in nausea and increased abd pain -Endorsed ongoing weight loss. Usual body weight  around 149 lb in 09/2013 (20% body weight loss, severe for time frame). Oncology RD provided pt with complimentary case of Ensure Plus, as well as educated on high kcal/protein foods/snacks and ways to incorporate into diet -Pt currently NPO s/p Whipple. Has NGT placed with 75 ml output. Per Impact nutrition study guidelines, pt will require Impact supplement TID for 5 days post op as tolerated. Will contact pharmacy to special order supplement. Discussed with RN -Pt would also benefit from diet education regarding appropriate post op Whipple recommendations -Pt with severe wasting in temporal, clavicle, occipital regions -   Height: Ht Readings from Last 1 Encounters:  08/07/14 5\' 3"  (1.6 m)    Weight: Wt Readings from Last 1 Encounters:  08/07/14 118 lb 6.2 oz (53.7 kg)    Ideal Body Weight: 115 lb  % Ideal Body Weight: 103%  Wt Readings from Last 10 Encounters:  08/07/14 118 lb 6.2 oz (53.7 kg)  08/02/14 121 lb 11.2 oz (55.203 kg)  07/30/14 121 lb (54.885 kg)  07/26/14 122 lb 1.6 oz (55.384 kg)  06/22/14 125 lb 4.8 oz (56.836 kg)  06/08/14 126 lb 11.2 oz (57.471 kg)  06/05/14 130 lb 1.6 oz (59.013 kg)  05/31/14 128 lb (58.06 kg)  05/24/14 130 lb 1.6 oz (59.013 kg)  05/22/14 131 lb 8 oz (59.648 kg)    Usual Body Weight: 149 lb  % Usual Body Weight: 79%  BMI:  Body mass index is 20.98 kg/(m^2).  Estimated Nutritional Needs: Kcal: 1650-1850 Protein: 85-100 gram Fluid: >/= 1700 ml daily  Skin: surgical incisions on abd  Diet Order: Diet NPO time specified  EDUCATION NEEDS: -  Education not appropriate at this time   Intake/Output Summary (Last 24 hours) at 08/08/14 1046 Last data filed at 08/08/14 0900  Gross per 24 hour  Intake   5335 ml  Output   1215 ml  Net   4120 ml    Last BM: 11/17   Labs:   Recent Labs Lab 08/06/14 1929 08/07/14 0427 08/08/14 0840  NA 141 141 136*  K 3.1* 3.8 4.7  CL 106 103 104  CO2 23 28 21   BUN 10 14 27*  CREATININE  0.48* 0.47* 0.91  CALCIUM 8.7 9.6 8.3*  MG  --   --  1.4*  PHOS  --   --  4.4  GLUCOSE 113* 90 124*    CBG (last 3)   Recent Labs  08/07/14 2332 08/08/14 0339 08/08/14 0802  GLUCAP 121* 133* 127*    Scheduled Meds: . acetaminophen  1,000 mg Intravenous 4 times per day  . antiseptic oral rinse  7 mL Mouth Rinse q12n4p  .  ceFAZolin (ANCEF) IV  1 g Intravenous Q6H  . enoxaparin (LOVENOX) injection  40 mg Subcutaneous Daily  . HYDROmorphone PCA 0.3 mg/mL   Intravenous 6 times per day  . insulin aspart  0-9 Units Subcutaneous 6 times per day  . pantoprazole (PROTONIX) IV  40 mg Intravenous QHS    Continuous Infusions: . bupivacaine ON-Q pain pump    . dextrose 5 % and 0.45 % NaCl with KCl 20 mEq/L 100 mL/hr at 08/08/14 7209    Past Medical History  Diagnosis Date  . Arthritis   . Depression   . H/O: substance abuse   . Back pain   . Personal history of colonic polyp - adenoma 10/26/2013    10/26/2013 diminutive sigmoid polyp removed    . Pancreatitis, acute   . Hypertension   . Osteoporosis   . Radiation 04/30/14-06/08/14    pancreas 50 gray  . Cancer     pancreatic cancer    Past Surgical History  Procedure Laterality Date  . Lumbar epidural injection    . Colonoscopy    . Ercp N/A 03/06/2014    Procedure: ENDOSCOPIC RETROGRADE CHOLANGIOPANCREATOGRAPHY (ERCP);  Surgeon: Gatha Mayer, MD;  Location[facility] ENDOSCOPY;  Service: Endoscopy;  Laterality: N/A;  . Eus N/A 04/05/2014    Procedure: UPPER ENDOSCOPIC ULTRASOUND (EUS) RADIAL;  Surgeon: [name], MD;  Location: [facility] ENDOSCOPY;  Service: Endoscopy;  Laterality: N/A;    Atlee Abide MS RD LDN Clinical Dietitian [phone]

## 2014-08-08 NOTE — Progress Notes (Signed)
1 Day Post-Op  Subjective: Required multiple doses of breakthrough dilaudid last night.  Adequate but marginal urine output overnight.    Objective: Vital signs in last 24 hours: Temp:  [98.2 F (36.8 C)-99.1 F (37.3 C)] 98.8 F (37.1 C) (11/18 0800) Pulse Rate:  [87-111] 100 (11/18 1100) Resp:  [8-26] 20 (11/18 1100) BP: (87-125)/(54-84) 121/68 mmHg (11/18 1100) SpO2:  [97 %-100 %] 99 % (11/18 1100) Arterial Line BP: (69-134)/(56-81) 117/69 mmHg (11/18 1100) Weight:  [118 lb 6.2 oz (53.7 kg)] 118 lb 6.2 oz (53.7 kg) (11/17 2000) Last BM Date: 08/07/14  Intake/Output from previous day: 11/17 0701 - 11/18 0700 In: 8035 [I.V.:7735; IV Piggyback:300] Out: 1590 [Urine:730; Emesis/NG output:75; Drains:335; Blood:450] Intake/Output this shift: Total I/O In: 500 [I.V.:500] Out: -   General appearance: cooperative and mild distress Resp: breathing comfortably Cardio: regular rate and rhythm GI: soft, approp tender, mildly distended, drains serosanguinous Extremities: extremities normal, atraumatic, no cyanosis or edema  Lab Results:   Recent Labs  08/07/14 0427 08/08/14 0840  WBC 3.8* 9.4  HGB 12.4 9.5*  HCT 37.8 28.0*  PLT 245 203   BMET  Recent Labs  08/07/14 0427 08/08/14 0840  NA 141 136*  K 3.8 4.7  CL 103 104  CO2 28 21  GLUCOSE 90 124*  BUN 14 27*  CREATININE 0.47* 0.91  CALCIUM 9.6 8.3*   PT/INR  Recent Labs  08/08/14 0620 08/08/14 0840  LABPROT QUESTIONABLE RESULTS, RECOMMEND RECOLLECT TO VERIFY 14.7  INR QUESTIONABLE RESULTS, RECOMMEND RECOLLECT TO VERIFY 1.13   ABG No results for input(s): PHART, HCO3 in the last 72 hours.  Invalid input(s): PCO2, PO2  Studies/Results: No results found.  Anti-infectives: Anti-infectives    Start     Dose/Rate Route Frequency Ordered Stop   08/07/14 1800  ceFAZolin (ANCEF) IVPB 1 g/50 mL premix     1 g100 mL/hr over 30 Minutes Intravenous Every 6 hours 08/07/14 1649 08/08/14 1159   08/07/14 0700   cefOXitin (MEFOXIN) 2 g in dextrose 5 % 50 mL IVPB     2 g100 mL/hr over 30 Minutes Intravenous On call 08/06/14 2233 08/08/14 0700   08/07/14 0106  cefOXitin (MEFOXIN) 2 g in dextrose 5 % 50 mL IVPB  Status:  Discontinued     2 g100 mL/hr over 30 Minutes Intravenous On call to O.R. 08/07/14 0107 08/07/14 0115      Assessment/Plan: s/p Procedure(s):  DIAGNOSTIC LAPAROSCOPY, WHIPPLE PROCEDURE (N/A) Continue foley due to strict I&O, patient critically ill, patient in ICU and urinary output monitoring  Increase PCA Replete magnesium for hypomagnesemia Albumin for hypovolemia Acute blood loss anemia - recheck in AM Await pathology. Change to stepdown status.    LOS: 2 days    [facility] 08/08/2014

## 2014-08-09 ENCOUNTER — Encounter (HOSPITAL_COMMUNITY): Payer: Self-pay | Admitting: General Surgery

## 2014-08-09 LAB — GLUCOSE, CAPILLARY
GLUCOSE-CAPILLARY: 105 mg/dL — AB (ref 70–99)
GLUCOSE-CAPILLARY: 119 mg/dL — AB (ref 70–99)
Glucose-Capillary: 98 mg/dL (ref 70–99)
Glucose-Capillary: 101 mg/dL — ABNORMAL HIGH (ref 70–99)
Glucose-Capillary: 98 mg/dL (ref 70–99)
Glucose-Capillary: 92 mg/dL (ref 70–99)
Glucose-Capillary: 105 mg/dL — ABNORMAL HIGH (ref 70–99)

## 2014-08-09 LAB — COMPREHENSIVE METABOLIC PANEL
ALBUMIN: 2.1 g/dL — AB (ref 3.5–5.2)
ALT: 174 U/L — ABNORMAL HIGH (ref 0–35)
ANION GAP: 8 (ref 5–15)
AST: 340 U/L — ABNORMAL HIGH (ref 0–37)
Alkaline Phosphatase: 56 U/L (ref 39–117)
BILIRUBIN TOTAL: 0.5 mg/dL (ref 0.3–1.2)
BUN: 15 mg/dL (ref 6–23)
CALCIUM: 8.2 mg/dL — AB (ref 8.4–10.5)
CO2: 23 mEq/L (ref 19–32)
CREATININE: 0.51 mg/dL (ref 0.50–1.10)
Chloride: 102 mEq/L (ref 96–112)
GFR calc Af Amer: >90 mL/min (ref >90)
Glucose, Bld: 102 mg/dL — ABNORMAL HIGH (ref 70–99)
Potassium: 4 mEq/L (ref 3.7–5.3)
Sodium: 133 mEq/L — ABNORMAL LOW (ref 137–147)
Total Protein: 5.7 g/dL — ABNORMAL LOW (ref 6.0–8.3)

## 2014-08-09 LAB — CBC
HEMATOCRIT: 23.7 % — AB (ref 36.0–46.0)
Hemoglobin: 8 g/dL — ABNORMAL LOW (ref 12.0–15.0)
MCH: 31.5 pg (ref 26.0–34.0)
MCHC: 33.8 g/dL (ref 30.0–36.0)
MCV: 93.3 fL (ref 78.0–100.0)
Platelets: 165 10*3/uL (ref 150–400)
RBC: 2.54 MIL/uL — ABNORMAL LOW (ref 3.87–5.11)
RDW: 13.7 % (ref 11.5–15.5)
WBC: 8.2 10*3/uL (ref 4.0–10.5)

## 2014-08-09 MED ORDER — VITAMINS A & D EX OINT
TOPICAL_OINTMENT | CUTANEOUS | Status: AC
  Administered 2014-08-09: 5
  Filled 2014-08-09: qty 5

## 2014-08-09 MED ORDER — IMPACT PO LIQD
237.0000 mL | Freq: Three times a day (TID) | ORAL | Status: DC
  Administered 2014-08-10 – 2014-08-13 (×10): 237 mL via ORAL
  Filled 2014-08-09 (×22): qty 237

## 2014-08-09 NOTE — Progress Notes (Signed)
NG tube d/c per MD orders    Patient tolerated it well

## 2014-08-09 NOTE — Plan of Care (Signed)
Problem: Consults Goal: General Surgical Patient Education (See Patient Education module for education specifics)  Outcome: Progressing     

## 2014-08-09 NOTE — Progress Notes (Signed)
Patient ID: Melinda Conrad, female   DOB: 1951-02-14, 63 y.o.   MRN: [id number] 2 Days Post-Op   Subjective: Pain improved.  No n/v.    Objective: Vital signs in last 24 hours: Temp:  [98.2 F (36.8 C)-99.7 F (37.6 C)] 99.3 F (37.4 C) (11/19 1028) Pulse Rate:  [98-112] 102 (11/19 1307) Resp:  [15-35] 22 (11/19 1307) BP: (116-166)/(57-83) 127/75 mmHg (11/19 1307) SpO2:  [96 %-100 %] 98 % (11/19 1307) Arterial Line BP: (120-165)/(66-88) 147/87 mmHg (11/19 0800) Last BM Date: 08/06/14  Intake/Output from previous day: 11/18 0701 - 11/19 0700 In: 3210 [I.V.:2510; IV Piggyback:700] Out: 1810 [Urine:1570; Drains:240] Intake/Output this shift: Total I/O In: 120 [P.O.:120] Out: 290 [Urine:200; Drains:90]  General appearance: cooperative and mild distress Resp: breathing comfortably Cardio: regular rate and rhythm GI: soft, approp tender, mildly distended, drains serosanguinous Extremities: extremities normal, atraumatic, no cyanosis or edema  Lab Results:   Recent Labs  08/08/14 0840 08/09/14 0425  WBC 9.4 8.2  HGB 9.5* 8.0*  HCT 28.0* 23.7*  PLT 203 165   BMET  Recent Labs  08/08/14 0840 08/09/14 0425  NA 136* 133*  K 4.7 4.0  CL 104 102  CO2 21 23  GLUCOSE 124* 102*  BUN 27* 15  CREATININE 0.91 0.51  CALCIUM 8.3* 8.2*   PT/INR  Recent Labs  08/08/14 0620 08/08/14 0840  LABPROT QUESTIONABLE RESULTS, RECOMMEND RECOLLECT TO VERIFY 14.7  INR QUESTIONABLE RESULTS, RECOMMEND RECOLLECT TO VERIFY 1.13   ABG No results for input(s): PHART, HCO3 in the last 72 hours.  Invalid input(s): PCO2, PO2  Studies/Results: No results found.  Anti-infectives: Anti-infectives    Start     Dose/Rate Route Frequency Ordered Stop   08/07/14 1800  ceFAZolin (ANCEF) IVPB 1 g/50 mL premix     1 g100 mL/hr over 30 Minutes Intravenous Every 6 hours 08/07/14 1649 08/08/14 1159   08/07/14 0700  cefOXitin (MEFOXIN) 2 g in dextrose 5 % 50 mL IVPB     2 g100 mL/hr over 30  Minutes Intravenous On call 08/06/14 2233 08/08/14 0700   08/07/14 0106  cefOXitin (MEFOXIN) 2 g in dextrose 5 % 50 mL IVPB  Status:  Discontinued     2 g100 mL/hr over 30 Minutes Intravenous On call to O.R. 08/07/14 0107 08/07/14 0115      Assessment/Plan: s/p Procedure(s):  DIAGNOSTIC LAPAROSCOPY, WHIPPLE PROCEDURE (N/A) D/c foley PCA Transfer to floor Recheck magnesium tomorrow.   Acute blood loss anemia - recheck in AM Await pathology. Add diltiazem for HTN. Add zoloft for depression.       LOS: 3 days    [facility] 08/09/2014

## 2014-08-10 ENCOUNTER — Inpatient Hospital Stay (HOSPITAL_COMMUNITY): Payer: Medicaid Other

## 2014-08-10 LAB — TYPE AND SCREEN
ABO/RH(D): O POS
ANTIBODY SCREEN: NEGATIVE
Unit division: 0
Unit division: 0
Unit division: 0
Unit division: 0

## 2014-08-10 LAB — URINALYSIS, ROUTINE W REFLEX MICROSCOPIC
BILIRUBIN URINE: NEGATIVE
Glucose, UA: NEGATIVE mg/dL
HGB URINE DIPSTICK: NEGATIVE
KETONES UR: NEGATIVE mg/dL
Leukocytes, UA: NEGATIVE
Nitrite: NEGATIVE
Protein, ur: NEGATIVE mg/dL
Specific Gravity, Urine: 1.013 (ref 1.005–1.030)
UROBILINOGEN UA: 4 mg/dL — AB (ref 0.0–1.0)
pH: 7 (ref 5.0–8.0)

## 2014-08-10 LAB — COMPREHENSIVE METABOLIC PANEL
ALK PHOS: 73 U/L (ref 39–117)
ALT: 113 U/L — ABNORMAL HIGH (ref 0–35)
AST: 145 U/L — AB (ref 0–37)
Albumin: 2.1 g/dL — ABNORMAL LOW (ref 3.5–5.2)
Anion gap: 14 (ref 5–15)
BUN: 9 mg/dL (ref 6–23)
CO2: 20 meq/L (ref 19–32)
Calcium: 8.6 mg/dL (ref 8.4–10.5)
Chloride: 99 mEq/L (ref 96–112)
Creatinine, Ser: 0.54 mg/dL (ref 0.50–1.10)
GLUCOSE: 88 mg/dL (ref 70–99)
Potassium: 4.7 mEq/L (ref 3.7–5.3)
Sodium: 133 mEq/L — ABNORMAL LOW (ref 137–147)
Total Bilirubin: 0.6 mg/dL (ref 0.3–1.2)
Total Protein: 6.6 g/dL (ref 6.0–8.3)

## 2014-08-10 LAB — GLUCOSE, CAPILLARY
GLUCOSE-CAPILLARY: 86 mg/dL (ref 70–99)
GLUCOSE-CAPILLARY: 109 mg/dL — AB (ref 70–99)
GLUCOSE-CAPILLARY: 93 mg/dL (ref 70–99)
GLUCOSE-CAPILLARY: 165 mg/dL — AB (ref 70–99)
GLUCOSE-CAPILLARY: 200 mg/dL — AB (ref 70–99)
Glucose-Capillary: 101 mg/dL — ABNORMAL HIGH (ref 70–99)

## 2014-08-10 LAB — CBC
HCT: 25.9 % — ABNORMAL LOW (ref 36.0–46.0)
HEMOGLOBIN: 8.6 g/dL — AB (ref 12.0–15.0)
MCH: 32.1 pg (ref 26.0–34.0)
MCHC: 33.2 g/dL (ref 30.0–36.0)
MCV: 96.6 fL (ref 78.0–100.0)
Platelets: 160 10*3/uL (ref 150–400)
RBC: 2.68 MIL/uL — AB (ref 3.87–5.11)
RDW: 13.5 % (ref 11.5–15.5)
WBC: 7.2 10*3/uL (ref 4.0–10.5)

## 2014-08-10 MED ORDER — INSULIN ASPART 100 UNIT/ML ~~LOC~~ SOLN
0.0000 [IU] | Freq: Three times a day (TID) | SUBCUTANEOUS | Status: DC
  Administered 2014-08-10 – 2014-08-11 (×2): 2 [IU] via SUBCUTANEOUS
  Administered 2014-08-12 – 2014-08-19 (×4): 1 [IU] via SUBCUTANEOUS

## 2014-08-10 MED ORDER — PIPERACILLIN-TAZOBACTAM 3.375 G IVPB
3.3750 g | Freq: Three times a day (TID) | INTRAVENOUS | Status: DC
  Administered 2014-08-10 – 2014-08-15 (×14): 3.375 g via INTRAVENOUS
  Filled 2014-08-10 (×14): qty 50

## 2014-08-10 MED ORDER — ACETAMINOPHEN 160 MG/5ML PO SOLN
650.0000 mg | Freq: Four times a day (QID) | ORAL | Status: DC | PRN
  Administered 2014-08-10 – 2014-08-16 (×8): 650 mg via ORAL
  Filled 2014-08-10 (×8): qty 20.3

## 2014-08-10 MED ORDER — BUPIVACAINE 0.25 % ON-Q PUMP DUAL CATH 300 ML
300.0000 mL | INJECTION | Status: DC
Start: 2014-08-10 — End: 2014-08-21
  Filled 2014-08-10: qty 300

## 2014-08-10 NOTE — Progress Notes (Signed)
General Surgery [facility]  Discussed with nurse and reviewed instructions from Dr. Barry Dienes.  CXR suggests pneumonia post op.  Will start on Zosyn IV.  Earnstine Regal, MD, [facility] Office: [phone]

## 2014-08-10 NOTE — Progress Notes (Signed)
NUTRITION FOLLOW UP  Intervention:   -Recommend IMPACT ADVANCED RECOVERY TID FOR 5 DAYS POST OP, each supplement provides 340 kcal, 9 gram protein -Diet advancement per MD -RD to continue to monitor supplement tolerance and education needs  Nutrition Dx:   Inadequate oral intake related to nausea/vomiting/abd pain as evidenced by PO intake < 75%, 30 lb weight loss in < one year; ongoing  Goal:   Pt to meet >/= 90% of their estimated nutrition needs; progressing  Monitor:   Diet order, supplement tolerance, total protein/energy intake, GI profile, labs, weights  Assessment:   11/18: -Pt being followed by outpatient oncology RD since 04/2014. Was started on Impact IR nutrition study, whereas she was instructed to consume Impact nutritional supplement TID for 5 days prior to Whipple procedure. Pt reported being able to consume 9 out of 15 supplements, equally approximately 2 supplements daily. Diet recall indicated pt continued with decreased appetite, but was able to tolerate baked chicken, fish, and turnip greens. Tried to increase caloric intake by consuming higher fat foods (mac and cheese); however this resulted in nausea and increased abd pain -Endorsed ongoing weight loss. Usual body weight around 149 lb in 09/2013 (20% body weight loss, severe for time frame). Oncology RD provided pt with complimentary case of Ensure Plus, as well as educated on high kcal/protein foods/snacks and ways to incorporate into diet -Pt currently NPO s/p Whipple. Has NGT placed with 75 ml output. Per Impact nutrition study guidelines, pt will require Impact supplement TID for 5 days post op as tolerated. Will contact pharmacy to special order supplement. Discussed with RN -Pt would also benefit from diet education regarding appropriate post op Whipple recommendations -Pt with severe wasting in temporal, clavicle, occipital regions  11/20: -NGT removed on 11/19 -Diet advanced to clear liquid on 11/19, and full  liquids on 11/20 -[place] for Impact supplement, two cases were ordered to be sufficient for study's post op supplement needs.  -Pt transferred to [facility]. Discussed study with pt's new RN and NT, and informed them of pt's supplement needs. Pt had not yet received supplement during time of RD follow up, but RN verbalized understanding and was in the process of providing pt with 8AM scheduled Impact shake -Discussed diet order with pt. Consuming <50% of meals, and endorsed mild nausea. Tolerating chicken broth, juice, jello, and tea -Pt aware of Impact supplement regimen, and preferred supplement regimen timing (8am, 12pm, 4pm) relayed to pharmacy and staff.  Height: Ht Readings from Last 1 Encounters:  08/07/14 5\' 3"  (1.6 m)    Weight Status:   Wt Readings from Last 1 Encounters:  08/07/14 118 lb 6.2 oz (53.7 kg)    Re-estimated needs:  Kcal: 1650-1850 Protein: 85-100 gram Fluid: >/= 1700 ml daily  Skin: surgical incisions on abd  Diet Order: Diet full liquid   Intake/Output Summary (Last 24 hours) at 08/10/14 1313 Last data filed at 08/10/14 1000  Gross per 24 hour  Intake   2900 ml  Output   2705 ml  Net    195 ml    Last BM: 11/16   Labs:   Recent Labs Lab 08/08/14 0840 08/09/14 0425 08/10/14 0515  NA 136* 133* 133*  K 4.7 4.0 4.7  CL 104 102 99  CO2 21 23 20   BUN 27* 15 9  CREATININE 0.91 0.51 0.54  CALCIUM 8.3* 8.2* 8.6  MG 1.4*  --   --   PHOS 4.4  --   --  GLUCOSE 124* 102* 88    CBG (last 3)   Recent Labs  08/09/14 2328 08/10/14 0342 08/10/14 0742  GLUCAP 105* 101* 86    Scheduled Meds: . *STUDY* feeding supplement (IMPACT AR)  237 mL Oral TID BM  . antiseptic oral rinse  7 mL Mouth Rinse q12n4p  . enoxaparin (LOVENOX) injection  40 mg Subcutaneous Daily  . HYDROmorphone PCA 0.3 mg/mL   Intravenous 6 times per day  . insulin aspart  0-9 Units Subcutaneous TID AC & HS  . pantoprazole (PROTONIX) IV  40 mg Intravenous QHS     Continuous Infusions: . bupivacaine 0.25 % ON-Q pump DUAL CATH 300 mL    . bupivacaine ON-Q pain pump    . dextrose 5 % and 0.45 % NaCl with KCl 10 mEq/L 100 mL/hr at 08/10/14 [place] LDN Clinical Dietitian [phone]

## 2014-08-10 NOTE — Progress Notes (Signed)
Dr. Harlow Asa aware via phone pt spiked temp to 103. Tachy in 120's. No distress or increased complaints of pain. Recent UA results WNL. See new orders received.

## 2014-08-10 NOTE — Progress Notes (Signed)
Patient ID: Melinda Conrad, female   DOB: August 03, 1951, 63 y.o.   MRN: [id number] 3 Days Post-Op   Subjective: Doing OK.  Had a temp last night.     Objective: Vital signs in last 24 hours: Temp:  [98 F (36.7 C)-101.5 F (38.6 C)] 98 F (36.7 C) (11/20 0513) Pulse Rate:  [97-113] 97 (11/20 0513) Resp:  [15-32] 24 (11/20 0746) BP: (115-143)/(60-83) 115/60 mmHg (11/20 0513) SpO2:  [95 %-100 %] 100 % (11/20 0746) Last BM Date: 08/06/14  Intake/Output from previous day: 11/19 0701 - 11/20 0700 In: 1220 [P.O.:120; I.V.:1100] Out: 2500 [Urine:2275; Drains:225] Intake/Output this shift:    General appearance: cooperative and mild distress Resp: breathing comfortably Cardio: regular rate and rhythm GI: soft, approp tender, mildly distended, drains serosanguinous Extremities: extremities normal, atraumatic, no cyanosis or edema  Lab Results:   Recent Labs  08/09/14 0425 08/10/14 0515  WBC 8.2 7.2  HGB 8.0* 8.6*  HCT 23.7* 25.9*  PLT 165 160   BMET  Recent Labs  08/09/14 0425 08/10/14 0515  NA 133* 133*  K 4.0 4.7  CL 102 99  CO2 23 20  GLUCOSE 102* 88  BUN 15 9  CREATININE 0.51 0.54  CALCIUM 8.2* 8.6   PT/INR  Recent Labs  08/08/14 0620 08/08/14 0840  LABPROT QUESTIONABLE RESULTS, RECOMMEND RECOLLECT TO VERIFY 14.7  INR QUESTIONABLE RESULTS, RECOMMEND RECOLLECT TO VERIFY 1.13   ABG No results for input(s): PHART, HCO3 in the last 72 hours.  Invalid input(s): PCO2, PO2  Studies/Results: No results found.  Anti-infectives: Anti-infectives    Start     Dose/Rate Route Frequency Ordered Stop   08/07/14 1800  ceFAZolin (ANCEF) IVPB 1 g/50 mL premix     1 g100 mL/hr over 30 Minutes Intravenous Every 6 hours 08/07/14 1649 08/08/14 1159   08/07/14 0700  cefOXitin (MEFOXIN) 2 g in dextrose 5 % 50 mL IVPB     2 g100 mL/hr over 30 Minutes Intravenous On call 08/06/14 2233 08/08/14 0700   08/07/14 0106  cefOXitin (MEFOXIN) 2 g in dextrose 5 % 50 mL IVPB   Status:  Discontinued     2 g100 mL/hr over 30 Minutes Intravenous On call to O.R. 08/07/14 0107 08/07/14 0115      Assessment/Plan: s/p Procedure(s):  DIAGNOSTIC LAPAROSCOPY, WHIPPLE PROCEDURE (N/A) PCA Refill OnQ Recheck magnesium tomorrow.   Acute blood loss anemia -stable Await pathology. Advance diet.   Diltiazem for HTN. Zoloft for depression.       LOS: 4 days    [facility] 08/10/2014

## 2014-08-10 NOTE — Plan of Care (Signed)
Problem: Phase I Progression Outcomes Goal: Pain controlled with appropriate interventions Outcome: Completed/Met Date Met:  08/10/14 Goal: Incision/dressings dry and intact Outcome: Completed/Met Date Met:  08/10/14 Goal: Sutures/staples intact Outcome: Completed/Met Date Met:  08/10/14 Goal: Tubes/drains patent Outcome: Completed/Met Date Met:  08/10/14 Goal: Vital signs/hemodynamically stable Outcome: Completed/Met Date Met:  08/10/14  Problem: Phase II Progression Outcomes Goal: Progressing with IS, TCDB Outcome: Completed/Met Date Met:  08/10/14

## 2014-08-11 LAB — GLUCOSE, CAPILLARY
GLUCOSE-CAPILLARY: 119 mg/dL — AB (ref 70–99)
Glucose-Capillary: 98 mg/dL (ref 70–99)
Glucose-Capillary: 153 mg/dL — ABNORMAL HIGH (ref 70–99)
Glucose-Capillary: 86 mg/dL (ref 70–99)

## 2014-08-11 LAB — COMPREHENSIVE METABOLIC PANEL
ALT: 62 U/L — ABNORMAL HIGH (ref 0–35)
AST: 53 U/L — AB (ref 0–37)
Albumin: 1.8 g/dL — ABNORMAL LOW (ref 3.5–5.2)
Alkaline Phosphatase: 63 U/L (ref 39–117)
Anion gap: 10 (ref 5–15)
BUN: 11 mg/dL (ref 6–23)
CALCIUM: 8.4 mg/dL (ref 8.4–10.5)
CO2: 24 mEq/L (ref 19–32)
CREATININE: 0.5 mg/dL (ref 0.50–1.10)
Chloride: 104 mEq/L (ref 96–112)
GFR calc non Af Amer: >90 mL/min (ref >90)
Glucose, Bld: 80 mg/dL (ref 70–99)
Potassium: 3.7 mEq/L (ref 3.7–5.3)
Sodium: 138 mEq/L (ref 137–147)
Total Bilirubin: 0.5 mg/dL (ref 0.3–1.2)
Total Protein: 6 g/dL (ref 6.0–8.3)

## 2014-08-11 LAB — CBC
HCT: 22.9 % — ABNORMAL LOW (ref 36.0–46.0)
Hemoglobin: 7.6 g/dL — ABNORMAL LOW (ref 12.0–15.0)
MCH: 31.7 pg (ref 26.0–34.0)
MCHC: 33.2 g/dL (ref 30.0–36.0)
MCV: 95.4 fL (ref 78.0–100.0)
PLATELETS: 169 10*3/uL (ref 150–400)
RBC: 2.4 MIL/uL — ABNORMAL LOW (ref 3.87–5.11)
RDW: 13.1 % (ref 11.5–15.5)
WBC: 6.9 10*3/uL (ref 4.0–10.5)

## 2014-08-11 LAB — MAGNESIUM: MAGNESIUM: 1.9 mg/dL (ref 1.5–2.5)

## 2014-08-11 NOTE — Plan of Care (Signed)
Problem: Phase II Progression Outcomes Goal: Vital signs stable Outcome: Completed/Met Date Met:  08/11/14 Goal: Dressings dry/intact Outcome: Completed/Met Date Met:  08/11/14

## 2014-08-11 NOTE — Progress Notes (Signed)
Patient ID: Melinda Conrad, female   DOB: 1950-10-08, 63 y.o.   MRN: [id number]  General Surgery - [facility] - Progress Note  POD# 5  Subjective: Patient more comfortable, fever resolved.  Taking limited po diet.  Denies nausea.  Productive cough.  Objective: Vital signs in last 24 hours: Temp:  [98 F (36.7 C)-103 F (39.4 C)] 98.7 F (37.1 C) (11/21 0552) Pulse Rate:  [82-126] 83 (11/21 0552) Resp:  [16-29] 16 (11/21 0841) BP: (117-146)/(65-77) 122/68 mmHg (11/21 0552) SpO2:  [95 %-100 %] 100 % (11/21 0841) Last BM Date: 08/06/14  Intake/Output from previous day: 11/20 0701 - 11/21 0700 In: 4040 [P.O.:440; I.V.:3600] Out: 2380 [Urine:2200; Drains:180]  Exam: HEENT - clear, not icteric Neck - soft Chest - rhonchi bilaterally Cor - RRR, no murmur Abd - moderate distension; rare BS present; drains with serosanguinous; dressing removed - midline wound intact with small drainage Ext - no significant edema Neuro - grossly intact, no focal deficits  Lab Results:   Recent Labs  08/10/14 0515 08/11/14 0555  WBC 7.2 6.9  HGB 8.6* 7.6*  HCT 25.9* 22.9*  PLT 160 169     Recent Labs  08/10/14 0515 08/11/14 0555  NA 133* 138  K 4.7 3.7  CL 99 104  CO2 20 24  GLUCOSE 88 80  BUN 9 11  CREATININE 0.54 0.50  CALCIUM 8.6 8.4    Studies/Results: Dg Chest Port 1 View  08/10/2014   CLINICAL DATA:  Fever and tachycardia. History of pancreatic cancer.  EXAM: PORTABLE CHEST - 1 VIEW  COMPARISON:  07/31/2014 and 03/04/2014  FINDINGS: Lungs are somewhat hypoinflated with new moderate airspace opacification over the right base and minimally within the left base. There is blunting of the right costophrenic angle likely an associated small to moderate right effusion. Cardiomediastinal silhouette and remainder of the exam is unchanged.  IMPRESSION: Evidence of a new smaller moderate right-sided pleural effusion. Airspace process over the right mid to lower  lung and medial left base likely infection.   Electronically Signed   By: Marin Olp M.D.   On: 08/10/2014 19:37    Assessment / Plan: 1.  Status post Whipple procedure  Continue present diet - encouraged patient to go slowly with oral intake  OOB, ambulate  PCA for pain control, On-Q pumps in place 2.  RLL pneumonia  Zosyn started last night  Blood cultures sent with fever [place], MD, [facility] Office: [phone]  08/11/2014

## 2014-08-12 LAB — CBC
HCT: 22 % — ABNORMAL LOW (ref 36.0–46.0)
Hemoglobin: 7.4 g/dL — ABNORMAL LOW (ref 12.0–15.0)
MCH: 31.2 pg (ref 26.0–34.0)
MCHC: 33.6 g/dL (ref 30.0–36.0)
MCV: 92.8 fL (ref 78.0–100.0)
Platelets: 207 10*3/uL (ref 150–400)
RBC: 2.37 MIL/uL — ABNORMAL LOW (ref 3.87–5.11)
RDW: 13 % (ref 11.5–15.5)
WBC: 7.9 10*3/uL (ref 4.0–10.5)

## 2014-08-12 LAB — COMPREHENSIVE METABOLIC PANEL
ALBUMIN: 1.6 g/dL — AB (ref 3.5–5.2)
ALT: 41 U/L — AB (ref 0–35)
AST: 29 U/L (ref 0–37)
Alkaline Phosphatase: 75 U/L (ref 39–117)
Anion gap: 12 (ref 5–15)
BUN: 9 mg/dL (ref 6–23)
CALCIUM: 8.2 mg/dL — AB (ref 8.4–10.5)
CO2: 22 meq/L (ref 19–32)
CREATININE: 0.5 mg/dL (ref 0.50–1.10)
Chloride: 104 mEq/L (ref 96–112)
GFR calc Af Amer: >90 mL/min (ref >90)
GFR calc non Af Amer: >90 mL/min (ref >90)
Glucose, Bld: 102 mg/dL — ABNORMAL HIGH (ref 70–99)
Potassium: 3 mEq/L — ABNORMAL LOW (ref 3.7–5.3)
SODIUM: 138 meq/L (ref 137–147)
Total Bilirubin: 0.4 mg/dL (ref 0.3–1.2)
Total Protein: 5.8 g/dL — ABNORMAL LOW (ref 6.0–8.3)

## 2014-08-12 LAB — GLUCOSE, CAPILLARY
GLUCOSE-CAPILLARY: 105 mg/dL — AB (ref 70–99)
Glucose-Capillary: 122 mg/dL — ABNORMAL HIGH (ref 70–99)
Glucose-Capillary: 122 mg/dL — ABNORMAL HIGH (ref 70–99)
Glucose-Capillary: 95 mg/dL (ref 70–99)

## 2014-08-12 MED ORDER — POTASSIUM CHLORIDE 10 MEQ/100ML IV SOLN
10.0000 meq | INTRAVENOUS | Status: AC
  Administered 2014-08-12 (×3): 10 meq via INTRAVENOUS
  Filled 2014-08-12 (×3): qty 100

## 2014-08-12 MED ORDER — KCL IN DEXTROSE-NACL 40-5-0.45 MEQ/L-%-% IV SOLN
INTRAVENOUS | Status: DC
  Administered 2014-08-12 – 2014-08-15 (×5): via INTRAVENOUS
  Administered 2014-08-17: 30 mL/h via INTRAVENOUS
  Filled 2014-08-12 (×8): qty 1000

## 2014-08-12 NOTE — Progress Notes (Signed)
Patient ID: TAUNI SANKS, female   DOB: 06/17/1951, 63 y.o.   MRN: [id number]  General Surgery - [facility] - Progress Note  POD# 6  Subjective: Patient in bed this AM. No complaints.  Family at bedside.  No BM's yet, passing flatus.  Denies nausea.  Ambulated twice yesterday.  Objective: Vital signs in last 24 hours: Temp:  [98.1 F (36.7 C)-102.7 F (39.3 C)] 99.2 F (37.3 C) (11/22 0559) Pulse Rate:  [92-107] 98 (11/22 0559) Resp:  [14-24] 24 (11/22 0735) BP: (147-159)/(72-82) 152/78 mmHg (11/22 0559) SpO2:  [97 %-100 %] 100 % (11/22 0735) Last BM Date: 08/06/14  Intake/Output from previous day: 11/21 0701 - 11/22 0700 In: 3240 [P.O.:840; I.V.:2400] Out: 2120 [Urine:1775; Drains:345]  Exam: HEENT - clear, not icteric Neck - soft Chest - coarse rhonchi bilaterally Cor - RRR, no murmur Abd - mild distension; BS present; JP drains with large thin clear serous fluid; midline wound with small tan drainage from mid-portion; On-Q in place Ext - no significant edema Neuro - grossly intact, no focal deficits  Lab Results:   Recent Labs  08/11/14 0555 08/12/14 0526  WBC 6.9 7.9  HGB 7.6* 7.4*  HCT 22.9* 22.0*  PLT 169 207     Recent Labs  08/11/14 0555 08/12/14 0526  NA 138 138  K 3.7 3.0*  CL 104 104  CO2 24 22  GLUCOSE 80 102*  BUN 11 9  CREATININE 0.50 0.50  CALCIUM 8.4 8.2*    Studies/Results: Dg Chest Port 1 View  08/10/2014   CLINICAL DATA:  Fever and tachycardia. History of pancreatic cancer.  EXAM: PORTABLE CHEST - 1 VIEW  COMPARISON:  07/31/2014 and 03/04/2014  FINDINGS: Lungs are somewhat hypoinflated with new moderate airspace opacification over the right base and minimally within the left base. There is blunting of the right costophrenic angle likely an associated small to moderate right effusion. Cardiomediastinal silhouette and remainder of the exam is unchanged.  IMPRESSION: Evidence of a new smaller moderate right-sided  pleural effusion. Airspace process over the right mid to lower lung and medial left base likely infection.   Electronically Signed   By: Marin Olp M.D.   On: 08/10/2014 19:37    Assessment / Plan: 1.  Status post Whipple procedure  Continue present diet - limited intake  Monitor drains and midline abdominal wound  Check labs in AM 11/23 2.  Pneumonia  RLL pneumonia on CXR  IV Zosyn  IS use, ambulation 3.  Hypokalemia  Replete with runs of KCL today  Check level in AM 11/23  Earnstine Regal, MD, [facility] Office: [phone]  08/12/2014

## 2014-08-12 NOTE — Plan of Care (Signed)
Problem: Phase II Progression Outcomes Goal: Pain controlled Outcome: Completed/Met Date Met:  08/12/14 Goal: Progress activity as tolerated unless otherwise ordered Outcome: Completed/Met Date Met:  08/12/14 Goal: Surgical site without signs of infection Outcome: Completed/Met Date Met:  08/12/14 Goal: Return of bowel function (flatus, BM) IF ABDOMINAL SURGERY:  Outcome: Completed/Met Date Met:  08/12/14

## 2014-08-13 LAB — COMPREHENSIVE METABOLIC PANEL
ALBUMIN: 1.6 g/dL — AB (ref 3.5–5.2)
ALT: 31 U/L (ref 0–35)
AST: 25 U/L (ref 0–37)
Alkaline Phosphatase: 77 U/L (ref 39–117)
Anion gap: 12 (ref 5–15)
BUN: 9 mg/dL (ref 6–23)
CALCIUM: 8.1 mg/dL — AB (ref 8.4–10.5)
CO2: 20 mEq/L (ref 19–32)
Chloride: 100 mEq/L (ref 96–112)
Creatinine, Ser: 0.51 mg/dL (ref 0.50–1.10)
GFR calc Af Amer: >90 mL/min (ref >90)
Glucose, Bld: 90 mg/dL (ref 70–99)
Potassium: 4 mEq/L (ref 3.7–5.3)
SODIUM: 132 meq/L — AB (ref 137–147)
Total Bilirubin: 0.4 mg/dL (ref 0.3–1.2)
Total Protein: 5.7 g/dL — ABNORMAL LOW (ref 6.0–8.3)

## 2014-08-13 LAB — CBC
HCT: 20.9 % — ABNORMAL LOW (ref 36.0–46.0)
Hemoglobin: 7.1 g/dL — ABNORMAL LOW (ref 12.0–15.0)
MCH: 31.6 pg (ref 26.0–34.0)
MCHC: 34 g/dL (ref 30.0–36.0)
MCV: 92.9 fL (ref 78.0–100.0)
PLATELETS: 234 10*3/uL (ref 150–400)
RBC: 2.25 MIL/uL — ABNORMAL LOW (ref 3.87–5.11)
RDW: 13 % (ref 11.5–15.5)
WBC: 12.4 10*3/uL — ABNORMAL HIGH (ref 4.0–10.5)

## 2014-08-13 LAB — GLUCOSE, CAPILLARY
GLUCOSE-CAPILLARY: 123 mg/dL — AB (ref 70–99)
GLUCOSE-CAPILLARY: 89 mg/dL (ref 70–99)
GLUCOSE-CAPILLARY: 80 mg/dL (ref 70–99)
Glucose-Capillary: 99 mg/dL (ref 70–99)

## 2014-08-13 MED ORDER — IMPACT PO LIQD
237.0000 mL | Freq: Three times a day (TID) | ORAL | Status: AC
  Administered 2014-08-13 – 2014-08-14 (×3): 237 mL via ORAL
  Filled 2014-08-13 (×5): qty 237

## 2014-08-13 NOTE — Progress Notes (Signed)
NUTRITION FOLLOW UP  Intervention:   -continue IMPACT ADVANCED RECOVERY TID FOR 5 DAYS POST OP, each supplement provides 340 kcal, 9 gram protein -encourage Po intake -RD to continue to monitor supplement tolerance and education needs  Nutrition Dx:   Inadequate oral intake related to nausea/vomiting/abd pain as evidenced by PO intake < 75%, 30 lb weight loss in < one year; ongoing  Goal:   Pt to meet >/= 90% of their estimated nutrition needs; progressing  Monitor:   PO intake, supplement tolerance, total protein/energy intake, GI profile, labs, weights  Assessment:   11/18: -Pt being followed by outpatient oncology RD since 04/2014. Was started on Impact IR nutrition study, whereas she was instructed to consume Impact nutritional supplement TID for 5 days prior to Whipple procedure. Pt reported being able to consume 9 out of 15 supplements, equally approximately 2 supplements daily. Diet recall indicated pt continued with decreased appetite, but was able to tolerate baked chicken, fish, and turnip greens. Tried to increase caloric intake by consuming higher fat foods (mac and cheese); however this resulted in nausea and increased abd pain -Endorsed ongoing weight loss. Usual body weight around 149 lb in 09/2013 (20% body weight loss, severe for time frame). Oncology RD provided pt with complimentary case of Ensure Plus, as well as educated on high kcal/protein foods/snacks and ways to incorporate into diet -Pt currently NPO s/p Whipple. Has NGT placed with 75 ml output. Per Impact nutrition study guidelines, pt will require Impact supplement TID for 5 days post op as tolerated. Will contact pharmacy to special order supplement. Discussed with RN -Pt would also benefit from diet education regarding appropriate post op Whipple recommendations -Pt with severe wasting in temporal, clavicle, occipital regions  11/20: -NGT removed on 11/19 -Diet advanced to clear liquid on 11/19, and full liquids  on 11/20 -[place] for Impact supplement, two cases were ordered to be sufficient for study's post op supplement needs.  -Pt transferred to [facility]. Discussed study with pt's new RN and NT, and informed them of pt's supplement needs. Pt had not yet received supplement during time of RD follow up, but RN verbalized understanding and was in the process of providing pt with 8AM scheduled Impact shake -Discussed diet order with pt. Consuming <50% of meals, and endorsed mild nausea. Tolerating chicken broth, juice, jello, and tea -Pt aware of Impact supplement regimen, and preferred supplement regimen timing (8am, 12pm, 4pm) relayed to pharmacy and staff.  11/23 -Diet advanced to heart healthy diet today. -PO intake variable on 11/22, 25-100% -Pt has been receiving Impact AR supplements TID at 0800, 1200, and 1400. Pt has only been able to drink 2/day. [place] notified. -Per pt and RN, it takes the pt all morning to drink the first supplement. Pt is too full to eat her meal trays. -RN requests if pt can receive 3rd supplement at night. -RD to adjust scheduling of supplement distribution to 0800, 1400, 2000. -Pt declined diet education today, would like the person who cooks for her to participate in the education, possible tomorrow.   Height: Ht Readings from Last 1 Encounters:  08/07/14 5\' 3"  (1.6 m)    Weight Status:   Wt Readings from Last 1 Encounters:  08/07/14 118 lb 6.2 oz (53.7 kg)    Re-estimated needs:  Kcal: 1650-1850 Protein: 85-100 gram Fluid: >/= 1700 ml daily  Skin: surgical incisions on abd  Diet Order: Diet Heart   Intake/Output Summary (Last 24 hours) at 08/13/14 1256  Last data filed at 08/13/14 0956  Gross per 24 hour  Intake   2015 ml  Output   2585 ml  Net   -570 ml    Last BM: 11/16   Labs:   Recent Labs Lab 08/08/14 0840  08/11/14 0555 08/12/14 0526 08/13/14 0415  NA 136*  < > 138 138 132*  K 4.7  < > 3.7 3.0* 4.0  CL 104  <  > 104 104 100  CO2 21  < > 24 22 20   BUN 27*  < > 11 9 9   CREATININE 0.91  < > 0.50 0.50 0.51  CALCIUM 8.3*  < > 8.4 8.2* 8.1*  MG 1.4*  --  1.9  --   --   PHOS 4.4  --   --   --   --   GLUCOSE 124*  < > 80 102* 90  < > = values in this interval not displayed.  CBG (last 3)   Recent Labs  08/12/14 2137 08/13/14 0844 08/13/14 1231  GLUCAP 95 99 123*    Scheduled Meds: . *STUDY* feeding supplement (IMPACT AR)  237 mL Oral TID BM  . antiseptic oral rinse  7 mL Mouth Rinse q12n4p  . enoxaparin (LOVENOX) injection  40 mg Subcutaneous Daily  . HYDROmorphone PCA 0.3 mg/mL   Intravenous 6 times per day  . insulin aspart  0-9 Units Subcutaneous TID AC & HS  . pantoprazole (PROTONIX) IV  40 mg Intravenous QHS  . piperacillin-tazobactam (ZOSYN)  IV  3.375 g Intravenous 3 times per day    Continuous Infusions: . bupivacaine 0.25 % ON-Q pump DUAL CATH 300 mL    . dextrose 5 % and 0.45 % NaCl with KCl 40 mEq/L 75 mL/hr at 08/13/14 [place], MS, RD, LDN Pager: [phone] After Hours Pager: [phone]

## 2014-08-13 NOTE — Care Management Note (Signed)
    Page 1 of 2   08/21/2014     11:41:04 AM CARE MANAGEMENT NOTE 08/21/2014  Patient:  Melinda Conrad, Melinda Conrad   Account Number:  [id number]  Date Initiated:  08/13/2014  Documentation initiated by:  Sunday Spillers  Subjective/Objective Assessment:   63 yo female admitted s/p whipple. PTA lived at home alone. Plans to d/c home with daughter.     Action/Plan:   Home when stable   Anticipated DC Date:  08/16/2014   Anticipated DC Plan:  [place]  CM consult      [facility] Choice  HOME HEALTH   Choice offered to / List presented to:  C-1 Patient        [place] arranged  HH-1 RN  [place].   Status of service:  Completed, signed off Medicare Important Message given?   (If response is "NO", the following Medicare IM given date fields will be blank) Date Medicare IM given:   Medicare IM given by:   Date Additional Medicare IM given:   Additional Medicare IM given by:    Discharge Disposition:  [place]  Per UR Regulation:  Reviewed for med. necessity/level of care/duration of stay  If discussed at [place] of Stay Meetings, dates discussed:    Comments:  08-15-14 Sunday Spillers RN CM 1500 recieved a call back from [place]', she chose [facility] for [facility] services. Contacted [facility] to follow for d/c needs. Awaiting final orders.  1300 Spoke with patient again, she does not have a choice for HH yet. Asked if I would contact her daughter Loran Senters at [phone] or Ms. Edison Pace at [phone] who is a caregiver. Called [facility]enee' x2 and left VM, called Ms. Edison Pace but # not working. Will continue to await orders and return call from daughter.  08-13-14 [place] [phone] Spoke with patient at bedside regarding d/c plans. Patient states she plans to d/c to her daughters home. States daughter will assist with her care. Unsure about [facility] agency for wound care, provided list for choice. She  wants to discuss with daughter prior to making a decision. Will f/u in am.

## 2014-08-13 NOTE — Progress Notes (Signed)
Patient ID: Melinda Conrad, female   DOB: [birth year], 63 y.o.   MRN: [id number] Patient ID: Melinda Conrad, female   DOB: [birth year], 63 y.o.   MRN: [id number]  General Surgery - [facility] - Progress Note  POD# 6  Subjective: Patient continues to have flatus.  No bm yet.  No nausea/vomiting.  Tolerating liquids.  Dx pneumonia over the weekend.    Objective: Vital signs in last 24 hours: Temp:  [98.8 F (37.1 C)-100.2 F (37.9 C)] 98.8 F (37.1 C) (11/23 0610) Pulse Rate:  [94-103] 103 (11/23 0610) Resp:  [22-31] 29 (11/23 0610) BP: (130-146)/(67-76) 130/67 mmHg (11/23 0610) SpO2:  [98 %-100 %] 100 % (11/23 0610) Last BM Date: 08/06/14  Intake/Output from previous day: 11/22 0701 - 11/23 0700 In: 1972.5 [P.O.:360; I.V.:1512.5; IV Piggyback:100] Out: 3055 [Urine:2600; Drains:455]  Exam: HEENT - clear, not icteric Neck - soft Chest - breathing comfortably Abd - mild distension; BS present; JP drains with large thin clear serous fluid; midline wound with murky drainage.  Central portion opened and packed.  OnQ removed.    Ext - no significant edema Neuro - grossly intact, no focal deficits  Lab Results:   Recent Labs  08/12/14 0526 08/13/14 0415  WBC 7.9 12.4*  HGB 7.4* 7.1*  HCT 22.0* 20.9*  PLT 207 234     Recent Labs  08/12/14 0526 08/13/14 0415  NA 138 132*  K 3.0* 4.0  CL 104 100  CO2 22 20  GLUCOSE 102* 90  BUN 9 9  CREATININE 0.50 0.51  CALCIUM 8.2* 8.1*    Studies/Results: No results found.  Assessment / Plan: 1.  Status post Whipple procedure  Keep drains.  Opened wound.  Wound care.  Advance diet.    2.  Pneumonia  RLL pneumonia on CXR  IV Zosyn  IS use, ambulation 3.  Hypokalemia    Improved.    [facility] Office: [phone]  08/13/2014

## 2014-08-13 NOTE — Plan of Care (Signed)
Problem: Phase I Progression Outcomes Goal: Voiding-avoid urinary catheter unless indicated Outcome: Progressing     

## 2014-08-14 LAB — GLUCOSE, CAPILLARY
Glucose-Capillary: 100 mg/dL — ABNORMAL HIGH (ref 70–99)
Glucose-Capillary: 105 mg/dL — ABNORMAL HIGH (ref 70–99)
Glucose-Capillary: 99 mg/dL (ref 70–99)
Glucose-Capillary: 92 mg/dL (ref 70–99)

## 2014-08-14 LAB — CREATININE, SERUM
Creatinine, Ser: 0.5 mg/dL (ref 0.50–1.10)
GFR calc Af Amer: >90 mL/min (ref >90)
GFR calc non Af Amer: >90 mL/min (ref >90)

## 2014-08-14 MED ORDER — BISACODYL 10 MG RE SUPP
10.0000 mg | Freq: Every day | RECTAL | Status: DC
  Administered 2014-08-14 – 2014-08-20 (×6): 10 mg via RECTAL
  Filled 2014-08-14 (×6): qty 1

## 2014-08-14 MED ORDER — OXYCODONE HCL 5 MG PO TABS
5.0000 mg | ORAL_TABLET | ORAL | Status: DC | PRN
  Administered 2014-08-14 – 2014-08-16 (×7): 10 mg via ORAL
  Administered 2014-08-16: 5 mg via ORAL
  Administered 2014-08-16 – 2014-08-17 (×4): 10 mg via ORAL
  Filled 2014-08-14 (×2): qty 2
  Filled 2014-08-14: qty 1
  Filled 2014-08-14 (×11): qty 2
  Filled 2014-08-14: qty 1

## 2014-08-14 MED ORDER — OXYCODONE HCL ER 15 MG PO T12A
15.0000 mg | EXTENDED_RELEASE_TABLET | Freq: Two times a day (BID) | ORAL | Status: DC
  Administered 2014-08-14 – 2014-08-16 (×6): 15 mg via ORAL
  Filled 2014-08-14 (×6): qty 1

## 2014-08-14 NOTE — Plan of Care (Signed)
Problem: Phase II Progression Outcomes Goal: Tolerating diet Outcome: Completed/Met Date Met:  08/14/14

## 2014-08-14 NOTE — Plan of Care (Signed)
Problem: Phase II Progression Outcomes Goal: Foley discontinued Outcome: Completed/Met Date Met:  08/14/14

## 2014-08-14 NOTE — Progress Notes (Signed)
Patient ID: Melinda Conrad, female   DOB: 1951-01-03, 63 y.o.   MRN: [id number]  General Surgery - [facility] - Progress Note   Subjective: Patient doing well other than pain.  She has had a small BM and is having flatus.    Objective: Vital signs in last 24 hours: Temp:  [98.2 F (36.8 C)-99.9 F (37.7 C)] 99.4 F (37.4 C) (11/24 0950) Pulse Rate:  [78-92] 78 (11/24 0950) Resp:  [14-22] 19 (11/24 0950) BP: (122-138)/(64-77) 138/77 mmHg (11/24 0950) SpO2:  [95 %-100 %] 96 % (11/24 0950) FiO2 (%):  [28 %] 28 % (11/24 0435) Last BM Date: 08/06/14  Intake/Output from previous day: 11/23 0701 - 11/24 0700 In: 2520 [P.O.:720; I.V.:1800] Out: 3230 [Urine:2900; Drains:330]  Exam: HEENT - clear, not icteric Chest - breathing comfortably Abd - mild distension; BS present; JP drains with large thin clear serous fluid  Ext - no significant edema Neuro - grossly intact, no focal deficits  Lab Results:   Recent Labs  08/12/14 0526 08/13/14 0415  WBC 7.9 12.4*  HGB 7.4* 7.1*  HCT 22.0* 20.9*  PLT 207 234     Recent Labs  08/12/14 0526 08/13/14 0415 08/14/14 0436  NA 138 132*  --   K 3.0* 4.0  --   CL 104 100  --   CO2 22 20  --   GLUCOSE 102* 90  --   BUN 9 9  --   CREATININE 0.50 0.51 0.50  CALCIUM 8.2* 8.1*  --     Studies/Results: No results found.  Assessment / Plan: 1.  Status post Whipple procedure  Advance diet.  Suppository for constipation.   Go back on oxycontin and po oxycodone for pain.  D/c PCA.    2.  Pneumonia  RLL pneumonia on CXR  IV Zosyn  IS use, ambulation  3.  Hypokalemia    Improved.    [facility] Office: [phone]  08/14/2014

## 2014-08-15 LAB — BASIC METABOLIC PANEL
Anion gap: 11 (ref 5–15)
BUN: 9 mg/dL (ref 6–23)
CO2: 22 mEq/L (ref 19–32)
Calcium: 8.5 mg/dL (ref 8.4–10.5)
Chloride: 102 mEq/L (ref 96–112)
Creatinine, Ser: 0.54 mg/dL (ref 0.50–1.10)
Glucose, Bld: 69 mg/dL — ABNORMAL LOW (ref 70–99)
Potassium: 3.9 mEq/L (ref 3.7–5.3)
Sodium: 135 mEq/L — ABNORMAL LOW (ref 137–147)

## 2014-08-15 LAB — CBC
HEMATOCRIT: 21.3 % — AB (ref 36.0–46.0)
Hemoglobin: 7.2 g/dL — ABNORMAL LOW (ref 12.0–15.0)
MCH: 31.2 pg (ref 26.0–34.0)
MCHC: 33.8 g/dL (ref 30.0–36.0)
MCV: 92.2 fL (ref 78.0–100.0)
Platelets: 294 10*3/uL (ref 150–400)
RBC: 2.31 MIL/uL — ABNORMAL LOW (ref 3.87–5.11)
RDW: 13.2 % (ref 11.5–15.5)
WBC: 9.2 10*3/uL (ref 4.0–10.5)

## 2014-08-15 LAB — GLUCOSE, CAPILLARY
GLUCOSE-CAPILLARY: 95 mg/dL (ref 70–99)
Glucose-Capillary: 72 mg/dL (ref 70–99)
Glucose-Capillary: 75 mg/dL (ref 70–99)
Glucose-Capillary: 91 mg/dL (ref 70–99)

## 2014-08-15 MED ORDER — IMPACT PO LIQD
237.0000 mL | Freq: Three times a day (TID) | ORAL | Status: AC
  Administered 2014-08-15 (×2): 237 mL via ORAL
  Filled 2014-08-15 (×6): qty 237

## 2014-08-15 MED ORDER — LEVOFLOXACIN 750 MG PO TABS
750.0000 mg | ORAL_TABLET | Freq: Every day | ORAL | Status: DC
  Administered 2014-08-15 – 2014-08-18 (×4): 750 mg via ORAL
  Filled 2014-08-15 (×5): qty 1

## 2014-08-15 NOTE — Progress Notes (Signed)
Patient ID: Melinda Conrad, female   DOB: Mar 30, 1951, 63 y.o.   MRN: [id number]  General Surgery - [facility] - Progress Note   Subjective: Pain improved with oxycontin and prn oxycodone.  Complains of having chills.  Tolerating diet.   Objective: Vital signs in last 24 hours: Temp:  [98.2 F (36.8 C)-99.4 F (37.4 C)] 98.7 F (37.1 C) (11/25 0505) Pulse Rate:  [67-80] 79 (11/25 0505) Resp:  [17-20] 17 (11/25 0505) BP: (115-138)/(44-77) 115/44 mmHg (11/25 0505) SpO2:  [95 %-100 %] 95 % (11/25 0505) Last BM Date: 08/14/14  Intake/Output from previous day: 11/24 0701 - 11/25 0700 In: 1622.5 [P.O.:840; I.V.:682.5; IV Piggyback:100] Out: 1786 [Urine:1600; Drains:185; Stool:1]  Exam: HEENT - clear, not icteric Chest - breathing comfortably Abd - mild distension; BS present; JP drains with thin clear serous fluid  Ext - no significant edema Neuro - grossly intact, no focal deficits  Lab Results:   Recent Labs  08/13/14 0415 08/15/14 0420  WBC 12.4* 9.2  HGB 7.1* 7.2*  HCT 20.9* 21.3*  PLT 234 294     Recent Labs  08/13/14 0415 08/14/14 0436 08/15/14 0420  NA 132*  --  135*  K 4.0  --  3.9  CL 100  --  102  CO2 20  --  22  GLUCOSE 90  --  69*  BUN 9  --  9  CREATININE 0.51 0.50 0.54  CALCIUM 8.1*  --  8.5    Studies/Results: No results found.  Assessment / Plan: 1.  Status post Whipple procedure  Diet as tolerated.   oxycontin/oxycodone   2.  Pneumonia  RLL pneumonia on CXR  Transition to oral meds.    IS use, ambulation  3.  Hypokalemia  Improved.   4.  Severe protein calorie malnutrition.   Impact study supplements. Check prealbumin. This would be primary determining factor for discharge.    [facility] Office: [phone]  08/15/2014

## 2014-08-15 NOTE — Progress Notes (Signed)
NUTRITION FOLLOW UP  Intervention:   -continue IMPACT ADVANCED RECOVERY TID until case is empty/discharge, each supplement provides 340 kcal, 9 gram protein -encourage PO intake -Provided "Whipple surgery nutrition therapy" handout to patient. -RD to continue to monitor supplement tolerance and education needs  Nutrition Dx:   Inadequate oral intake related to nausea/vomiting/abd pain as evidenced by PO intake < 75%, 30 lb weight loss in < one year; ongoing  Goal:   Pt to meet >/= 90% of their estimated nutrition needs; progressing  Monitor:   PO intake, supplement tolerance, total protein/energy intake, GI profile, labs, weights  Assessment:   11/18: -Pt being followed by outpatient oncology RD since 04/2014. Was started on Impact IR nutrition study, whereas she was instructed to consume Impact nutritional supplement TID for 5 days prior to Whipple procedure. Pt reported being able to consume 9 out of 15 supplements, equally approximately 2 supplements daily. Diet recall indicated pt continued with decreased appetite, but was able to tolerate baked chicken, fish, and turnip greens. Tried to increase caloric intake by consuming higher fat foods (mac and cheese); however this resulted in nausea and increased abd pain -Endorsed ongoing weight loss. Usual body weight around 149 lb in 09/2013 (20% body weight loss, severe for time frame). Oncology RD provided pt with complimentary case of Ensure Plus, as well as educated on high kcal/protein foods/snacks and ways to incorporate into diet -Pt currently NPO s/p Whipple. Has NGT placed with 75 ml output. Per Impact nutrition study guidelines, pt will require Impact supplement TID for 5 days post op as tolerated. Will contact pharmacy to special order supplement. Discussed with RN -Pt would also benefit from diet education regarding appropriate post op Whipple recommendations -Pt with severe wasting in temporal, clavicle, occipital  regions  11/20: -NGT removed on 11/19 -Diet advanced to clear liquid on 11/19, and full liquids on 11/20 -[place] for Impact supplement, two cases were ordered to be sufficient for study's post op supplement needs.  -Pt transferred to [facility]. Discussed study with pt's new RN and NT, and informed them of pt's supplement needs. Pt had not yet received supplement during time of RD follow up, but RN verbalized understanding and was in the process of providing pt with 8AM scheduled Impact shake -Discussed diet order with pt. Consuming <50% of meals, and endorsed mild nausea. Tolerating chicken broth, juice, jello, and tea -Pt aware of Impact supplement regimen, and preferred supplement regimen timing (8am, 12pm, 4pm) relayed to pharmacy and staff.  11/23 -Diet advanced to heart healthy diet today. -PO intake variable on 11/22, 25-100% -Pt has been receiving Impact AR supplements TID at 0800, 1200, and 1400. Pt has only been able to drink 2/day. [place] notified. -Per pt and RN, it takes the pt all morning to drink the first supplement. Pt is too full to eat her meal trays. -RN requests if pt can receive 3rd supplement at night. -RD to adjust scheduling of supplement distribution to 0800, 1400, 2000. -Pt declined diet education today, would like the person who cooks for her to participate in the education, possible tomorrow.  11/25  -Pt was visited by [place], Recommended Impact AR order be reinitiated until she consumes her case of the supplement and/or pt is discharged. -PO intake: 30-50% Per RN, pt did not have dinner last night. Pt is eating very little. Unsure if pt has had any breakfast. -Calorie Count has been ordered by RN.  -Discharge is to be based on  prealbumin level per MD note -Pt reports low appetite -Pt was very lethargic during visit. Pt requested RD leave diet education materials for her to read regarding Whipple surgery nutrition.  Height: Ht  Readings from Last 1 Encounters:  08/07/14 5\' 3"  (1.6 m)    Weight Status:   Wt Readings from Last 1 Encounters:  08/07/14 118 lb 6.2 oz (53.7 kg)    Re-estimated needs:  Kcal: 1650-1850 Protein: 85-100 gram Fluid: >/= 1700 ml daily  Skin: surgical incisions on abd  Diet Order: Diet Heart   Intake/Output Summary (Last 24 hours) at 08/15/14 1100 Last data filed at 08/15/14 0925  Gross per 24 hour  Intake   1250 ml  Output   1510 ml  Net   -260 ml    Last BM: 11/24   Labs:   Recent Labs Lab 08/11/14 0555 08/12/14 0526 08/13/14 0415 08/14/14 0436 08/15/14 0420  NA 138 138 132*  --  135*  K 3.7 3.0* 4.0  --  3.9  CL 104 104 100  --  102  CO2 24 22 20   --  22  BUN 11 9 9   --  9  CREATININE 0.50 0.50 0.51 0.50 0.54  CALCIUM 8.4 8.2* 8.1*  --  8.5  MG 1.9  --   --   --   --   GLUCOSE 80 102* 90  --  69*    CBG (last 3)   Recent Labs  08/14/14 1741 08/14/14 2132 08/15/14 0741  GLUCAP 99 92 72    Scheduled Meds: . bisacodyl  10 mg Rectal Daily  . enoxaparin (LOVENOX) injection  40 mg Subcutaneous Daily  . insulin aspart  0-9 Units Subcutaneous TID AC & HS  . levofloxacin  750 mg Oral Daily  . OxyCODONE  15 mg Oral Q12H  . pantoprazole (PROTONIX) IV  40 mg Intravenous QHS    Continuous Infusions: . bupivacaine 0.25 % ON-Q pump DUAL CATH 300 mL    . dextrose 5 % and 0.45 % NaCl with KCl 40 mEq/L 30 mL/hr at 08/14/14 [place], MS, RD, LDN Pager: [phone] After Hours Pager: [phone]

## 2014-08-15 NOTE — Progress Notes (Signed)
Nutrition Note  48 hour Calorie Count ordered for pt on 11/25, RN notified, started at lunch time. At this time, insufficient data collected d/t start time of calorie count.  RD to complete Calorie Count on 11/27, if pt is not discharged. Per case management, pt is expected to be discharged tomorrow 11/26.   If nutrition issues arise, please re-consult RD.   Clayton Bibles, MS, RD, LDN Pager: [phone] After Hours Pager: [phone]

## 2014-08-15 NOTE — Progress Notes (Signed)
Pt JP drains removed this morning without complications. Gauze covering sites with tape. Pt is slight pain. Pain medication given. Report given to [facility].

## 2014-08-16 LAB — GLUCOSE, CAPILLARY
GLUCOSE-CAPILLARY: 88 mg/dL (ref 70–99)
Glucose-Capillary: 88 mg/dL (ref 70–99)
Glucose-Capillary: 98 mg/dL (ref 70–99)
Glucose-Capillary: 71 mg/dL (ref 70–99)

## 2014-08-16 LAB — COMPREHENSIVE METABOLIC PANEL
ALT: 17 U/L (ref 0–35)
ANION GAP: 10 (ref 5–15)
AST: 28 U/L (ref 0–37)
Albumin: 1.5 g/dL — ABNORMAL LOW (ref 3.5–5.2)
Alkaline Phosphatase: 104 U/L (ref 39–117)
BUN: 8 mg/dL (ref 6–23)
CALCIUM: 8.3 mg/dL — AB (ref 8.4–10.5)
CHLORIDE: 103 meq/L (ref 96–112)
CO2: 21 meq/L (ref 19–32)
CREATININE: 0.51 mg/dL (ref 0.50–1.10)
GFR calc non Af Amer: >90 mL/min (ref >90)
GLUCOSE: 83 mg/dL (ref 70–99)
Potassium: 3.9 mEq/L (ref 3.7–5.3)
Sodium: 134 mEq/L — ABNORMAL LOW (ref 137–147)
Total Bilirubin: 0.3 mg/dL (ref 0.3–1.2)
Total Protein: 6 g/dL (ref 6.0–8.3)

## 2014-08-16 LAB — CBC
HEMATOCRIT: 21.1 % — AB (ref 36.0–46.0)
HEMOGLOBIN: 7 g/dL — AB (ref 12.0–15.0)
MCH: 31.1 pg (ref 26.0–34.0)
MCHC: 33.2 g/dL (ref 30.0–36.0)
MCV: 93.8 fL (ref 78.0–100.0)
Platelets: 310 10*3/uL (ref 150–400)
RBC: 2.25 MIL/uL — AB (ref 3.87–5.11)
RDW: 13.3 % (ref 11.5–15.5)
WBC: 10.5 10*3/uL (ref 4.0–10.5)

## 2014-08-16 LAB — PREALBUMIN: PREALBUMIN: 3.8 mg/dL — AB (ref 17.0–34.0)

## 2014-08-16 NOTE — Progress Notes (Signed)
Patient ID: Melinda Conrad, female   DOB: 1951-01-26, 63 y.o.   MRN: [id number]  General Surgery - [facility] - Progress Note   Subjective: Pain tolerable with oxycontin and prn oxycodone.Tolerating diet in small amounts.  Drinking supplements between meals.  Urinating well per pt.     Objective: Vital signs in last 24 hours: Temp:  [98.4 F (36.9 C)-98.7 F (37.1 C)] 98.7 F (37.1 C) (11/26 0559) Pulse Rate:  [78-93] 93 (11/26 0559) Resp:  [16-18] 16 (11/26 0559) BP: (97-115)/(58-73) 115/73 mmHg (11/26 0559) SpO2:  [97 %-100 %] 100 % (11/26 0559) Last BM Date: 08/14/14  Intake/Output from previous day: 11/25 0701 - 11/26 0700 In: 1060 [P.O.:340; I.V.:720] Out: 600 [Urine:550; Drains:50]  Exam: HEENT - clear, not icteric Chest - breathing comfortably Abd - mild distension Ext - no significant edema Neuro - grossly intact, no focal deficits  Lab Results:   Recent Labs  08/15/14 0420 08/16/14 0459  WBC 9.2 10.5  HGB 7.2* 7.0*  HCT 21.3* 21.1*  PLT 294 310     Recent Labs  08/15/14 0420 08/16/14 0459  NA 135* 134*  K 3.9 3.9  CL 102 103  CO2 22 21  GLUCOSE 69* 83  BUN 9 8  CREATININE 0.54 0.51  CALCIUM 8.5 8.3*    Studies/Results: No results found.  Assessment / Plan: 1.  Status post Whipple procedure  Diet as tolerated.   oxycontin/oxycodone   2.  Pneumonia  RLL pneumonia on CXR  Cont Levaquin until Sat for 7 day course    IS use, ambulation  3.  Hypokalemia  Improved.  Stable  4.  Severe protein calorie malnutrition.   Impact study supplements.  Diet as tolerated  5.  Cont PT and ambulation.  Hopefully d/c this weekend.     [facility] Office: [phone]  08/16/2014

## 2014-08-16 NOTE — Progress Notes (Signed)
On call MD text paged patient with positive blood cultures from 11-20.  Patient currently on Levaquin and afebrile.

## 2014-08-16 NOTE — Plan of Care (Signed)
Problem: Discharge Progression Outcomes Goal: Pain controlled with appropriate interventions Outcome: Completed/Met Date Met:  08/16/14

## 2014-08-16 NOTE — Evaluation (Signed)
Physical Therapy Evaluation Patient Details Name: KESTREL MIS MRN: [id number] DOB: 12-Jun-1951 Today's Date: 08/16/2014   History of Present Illness  63 yo female s/p whipple.   Clinical Impression  On eval, pt was supervision level for mobility-able to ambulate ~500 feet using RW. Tolerated activity well. Do not anticipate any follow up PT needs at discharge. Encouraged pt to ambulate again later with nursing/family supervision.     Follow Up Recommendations No PT follow up;Supervision - Intermittent    Equipment Recommendations  Rolling walker with 5" wheels (possibly-will continue to assess)    Recommendations for Other Services       Precautions / Restrictions Precautions Precautions: None Precaution Comments: abdominal surgery Restrictions Weight Bearing Restrictions: No      Mobility  Bed Mobility Overal bed mobility: Needs Assistance Bed Mobility: Rolling;Sidelying to Sit;Sit to Supine Rolling: Supervision Sidelying to sit: HOB elevated;Supervision   Sit to supine: Supervision      Transfers Overall transfer level: Needs assistance Equipment used: Rolling walker (2 wheeled) Transfers: Sit to/from Stand Sit to Stand: Supervision            Ambulation/Gait Ambulation/Gait assistance: Supervision Ambulation Distance (Feet): 500 Feet Assistive device: Rolling walker (2 wheeled) Gait Pattern/deviations: Step-through pattern        Stairs            Wheelchair Mobility    Modified Rankin (Stroke Patients Only)       Balance                                             Pertinent Vitals/Pain Pain Assessment: No/denies pain    Home Living Family/patient expects to be discharged to:: Private residence Living Arrangements: Children   Type of Home: Apartment (on 12th floor) Home Access: Elevator     Home Layout: One level Home Equipment: None      Prior Function Level of Independence: Independent                Hand Dominance        Extremity/Trunk Assessment   Upper Extremity Assessment: Overall WFL for tasks assessed           Lower Extremity Assessment: Generalized weakness      Cervical / Trunk Assessment: Normal  Communication   Communication: No difficulties  Cognition Arousal/Alertness: Awake/alert Behavior During Therapy: WFL for tasks assessed/performed Overall Cognitive Status: Within Functional Limits for tasks assessed                      General Comments      Exercises        Assessment/Plan    PT Assessment Patient needs continued PT services  PT Diagnosis Difficulty walking;Generalized weakness;Acute pain   PT Problem List Decreased strength;Decreased activity tolerance;Decreased balance;Decreased mobility;Decreased knowledge of use of DME;Pain  PT Treatment Interventions DME instruction;Gait training;Functional mobility training;Therapeutic activities;Patient/family education;Balance training;Therapeutic exercise   PT Goals (Current goals can be found in the Care Plan section) Acute Rehab PT Goals Time For Goal Achievement: 08/30/14 Potential to Achieve Goals: Good    Frequency Min 3X/week   Barriers to discharge        Co-evaluation               End of Session   Activity Tolerance: Patient tolerated treatment well Patient left: in bed;with call bell/phone within  reach           Time: 1009-1019 PT Time Calculation (min) (ACUTE ONLY): 10 min   Charges:   PT Evaluation $Initial PT Evaluation Tier I: 1 Procedure PT Treatments $Gait Training: 8-22 mins   PT G Codes:          Weston Anna, MPT Pager: [phone]

## 2014-08-16 NOTE — Plan of Care (Signed)
Problem: Discharge Progression Outcomes Goal: Discharge plan in place and appropriate Outcome: Completed/Met Date Met:  08/16/14     

## 2014-08-16 NOTE — Plan of Care (Signed)
Problem: Phase I Progression Outcomes Goal: Initial discharge plan identified Outcome: Completed/Met Date Met:  08/16/14 Goal: Voiding-avoid urinary catheter unless indicated Outcome: Completed/Met Date Met:  08/16/14 Goal: Other Phase I Outcomes/Goals Outcome: Completed/Met Date Met:  08/16/14  Problem: Phase II Progression Outcomes Goal: Sutures/staples intact Outcome: Completed/Met Date Met:  08/16/14 Removed staples placed steri strips Goal: Discharge plan established Outcome: Completed/Met Date Met:  08/16/14 Goal: Other Phase II Outcomes/Goals Outcome: Completed/Met Date Met:  08/16/14

## 2014-08-16 NOTE — Plan of Care (Signed)
Problem: Discharge Progression Outcomes Goal: Tolerating diet Outcome: Completed/Met Date Met:  08/16/14

## 2014-08-17 LAB — GLUCOSE, CAPILLARY
GLUCOSE-CAPILLARY: 89 mg/dL (ref 70–99)
GLUCOSE-CAPILLARY: 83 mg/dL (ref 70–99)
GLUCOSE-CAPILLARY: 112 mg/dL — AB (ref 70–99)
Glucose-Capillary: 99 mg/dL (ref 70–99)
Glucose-Capillary: 70 mg/dL (ref 70–99)

## 2014-08-17 MED ORDER — POLYETHYLENE GLYCOL 3350 17 G PO PACK
17.0000 g | PACK | Freq: Two times a day (BID) | ORAL | Status: DC
  Administered 2014-08-17 – 2014-08-20 (×5): 17 g via ORAL
  Filled 2014-08-17 (×10): qty 1

## 2014-08-17 MED ORDER — ENSURE COMPLETE PO LIQD
237.0000 mL | Freq: Two times a day (BID) | ORAL | Status: DC
  Administered 2014-08-18 – 2014-08-21 (×5): 237 mL via ORAL

## 2014-08-17 MED ORDER — OXYCODONE HCL ER 20 MG PO T12A
20.0000 mg | EXTENDED_RELEASE_TABLET | Freq: Two times a day (BID) | ORAL | Status: DC
  Administered 2014-08-17 – 2014-08-21 (×9): 20 mg via ORAL
  Filled 2014-08-17 (×9): qty 1

## 2014-08-17 MED ORDER — IMPACT PO LIQD
237.0000 mL | ORAL | Status: AC
Start: 2014-08-17 — End: 2014-08-17
  Administered 2014-08-17: 237 mL via ORAL
  Filled 2014-08-17: qty 237

## 2014-08-17 NOTE — Plan of Care (Signed)
Problem: Phase III Progression Outcomes Goal: Demonstrates TCDB, IS independently Outcome: Progressing

## 2014-08-17 NOTE — Plan of Care (Signed)
Problem: Phase III Progression Outcomes Goal: Voiding independently Outcome: Completed/Met Date Met:  08/17/14 Goal: Nasogastric tube discontinued Outcome: Not Applicable Date Met:  43/53/91 Goal: Demonstrates TCDB, IS independently Outcome: Completed/Met Date Met:  08/17/14

## 2014-08-17 NOTE — Progress Notes (Signed)
Patient ID: Melinda Conrad, female   DOB: 1950/09/26, 63 y.o.   MRN: [id number]  General Surgery - [facility] - Progress Note   Subjective: Pt still requiring breakthrough dilaudid for pain.Tolerating diet in small amounts.  Seems to be eating better.  Drinking supplements between meals.    Objective: Vital signs in last 24 hours: Temp:  [98.5 F (36.9 C)-98.8 F (37.1 C)] 98.8 F (37.1 C) (11/27 0524) Pulse Rate:  [72-79] 72 (11/27 0524) Resp:  [16-18] 16 (11/27 0524) BP: (107-136)/(59-66) 136/59 mmHg (11/27 0524) SpO2:  [99 %-100 %] 99 % (11/27 0524) Last BM Date: 08/16/14  Intake/Output from previous day: 11/26 0701 - 11/27 0700 In: 1400 [P.O.:680; I.V.:720] Out: 700 [Urine:700]  Exam: HEENT - clear, not icteric Chest - breathing comfortably Abd - mild distension Ext - no significant edema Neuro - grossly intact, no focal deficits  Lab Results:   Recent Labs  08/15/14 0420 08/16/14 0459  WBC 9.2 10.5  HGB 7.2* 7.0*  HCT 21.3* 21.1*  PLT 294 310     Recent Labs  08/15/14 0420 08/16/14 0459  NA 135* 134*  K 3.9 3.9  CL 102 103  CO2 22 21  GLUCOSE 69* 83  BUN 9 8  CREATININE 0.54 0.51  CALCIUM 8.5 8.3*    Studies/Results: No results found.  Assessment / Plan: 1.  Status post Whipple procedure  Diet as tolerated.   Oxycontin/oxycodone: will increase oxycontin to 20mg  bid to help with her pain control  2.  Pneumonia  RLL pneumonia on CXR  Cont Levaquin until Sat for 7 day course    IS use, ambulation  3.  Hypokalemia  Improved.  Stable  4.  Severe protein calorie malnutrition.   Impact study supplements.  Diet as tolerated  5. Acute blood loss anemia: Hgb stable, no tachycardia or other signs of symptomatic anemia  5.  Cont PT and ambulation.  Hopefully d/c this weekend.     [facility] Office: [phone]  08/17/2014

## 2014-08-17 NOTE — Progress Notes (Addendum)
NUTRITION FOLLOW UP  Intervention:   -continue IMPACT ADVANCED RECOVERY TID until case is empty/discharge, each supplement provides 340 kcal, 9 gram protein. Patient to receive last supplement today. -Ensure Plus BID -encourage PO intake -No calorie count information collected by nursing staff. Discontinue calorie count.   Nutrition Dx:   Inadequate oral intake related to nausea/vomiting/abd pain as evidenced by PO intake < 75%, 30 lb weight loss in < one year; ongoing  Goal:   Pt to meet >/= 90% of their estimated nutrition needs; progressing  Monitor:   PO intake, supplement tolerance, total protein/energy intake, GI profile, labs, weights  Assessment:   11/18: -Pt being followed by outpatient oncology RD since 04/2014. Was started on Impact IR nutrition study, whereas she was instructed to consume Impact nutritional supplement TID for 5 days prior to Whipple procedure. Pt reported being able to consume 9 out of 15 supplements, equally approximately 2 supplements daily. Diet recall indicated pt continued with decreased appetite, but was able to tolerate baked chicken, fish, and turnip greens. Tried to increase caloric intake by consuming higher fat foods (mac and cheese); however this resulted in nausea and increased abd pain -Endorsed ongoing weight loss. Usual body weight around 149 lb in 09/2013 (20% body weight loss, severe for time frame). Oncology RD provided pt with complimentary case of Ensure Plus, as well as educated on high kcal/protein foods/snacks and ways to incorporate into diet -Pt currently NPO s/p Whipple. Has NGT placed with 75 ml output. Per Impact nutrition study guidelines, pt will require Impact supplement TID for 5 days post op as tolerated. Will contact pharmacy to special order supplement. Discussed with RN -Pt would also benefit from diet education regarding appropriate post op Whipple recommendations -Pt with severe wasting in temporal, clavicle, occipital  regions  11/20: -NGT removed on 11/19 -Diet advanced to clear liquid on 11/19, and full liquids on 11/20 -[place] for Impact supplement, two cases were ordered to be sufficient for study's post op supplement needs.  -Pt transferred to [facility]. Discussed study with pt's new RN and NT, and informed them of pt's supplement needs. Pt had not yet received supplement during time of RD follow up, but RN verbalized understanding and was in the process of providing pt with 8AM scheduled Impact shake -Discussed diet order with pt. Consuming <50% of meals, and endorsed mild nausea. Tolerating chicken broth, juice, jello, and tea -Pt aware of Impact supplement regimen, and preferred supplement regimen timing (8am, 12pm, 4pm) relayed to pharmacy and staff.  11/23 -Diet advanced to heart healthy diet today. -PO intake variable on 11/22, 25-100% -Pt has been receiving Impact AR supplements TID at 0800, 1200, and 1400. Pt has only been able to drink 2/day. [place] notified. -Per pt and RN, it takes the pt all morning to drink the first supplement. Pt is too full to eat her meal trays. -RN requests if pt can receive 3rd supplement at night. -RD to adjust scheduling of supplement distribution to 0800, 1400, 2000. -Pt declined diet education today, would like the person who cooks for her to participate in the education, possible tomorrow.  11/25  -Pt was visited by [employer], Recommended Impact AR order be reinitiated until she consumes her case of the supplement and/or pt is discharged. -PO intake: 30-50% Per RN, pt did not have dinner last night. Pt is eating very little. Unsure if pt has had any breakfast. -Calorie Count has been ordered by RN.  -Discharge is to  be based on prealbumin level per MD note -Pt reports low appetite -Pt was very lethargic during visit. Pt requested RD leave diet education materials for her to read regarding Whipple surgery nutrition.  11/27 -Pt states  she has not had her Impact in 2 days. -Pt was delivered breakfast at time of visit; states "this will be my breakfast and lunch."   -Pt feels like her intake is improving but acknowledges she is having trouble making significant gains because "it's been so long and my stomach is so small."  Also states her pain control has been difficult and preventing her from eating.  -Pt requests Impact.  RD to discuss with RN. Order in computer has expired.  Will verify Impact case is still available in Pharmacy.  -Note plans for possible d/c this weekend.  Patient states she has some Impact at home for discharge.  -RD to discuss care plan with PharmD as well as [place] RD  Height: Ht Readings from Last 1 Encounters:  08/07/14 5\' 3"  (1.6 m)    Weight Status:   Wt Readings from Last 1 Encounters:  08/07/14 118 lb 6.2 oz (53.7 kg)    Re-estimated needs:  Kcal: 1650-1850 Protein: 85-100 gram Fluid: >/= 1700 ml daily  Skin: surgical incisions on abd  Diet Order: Diet Heart   Intake/Output Summary (Last 24 hours) at 08/17/14 1012 Last data filed at 08/17/14 0600  Gross per 24 hour  Intake   1160 ml  Output    300 ml  Net    860 ml    Last BM: 11/26   Labs:   Recent Labs Lab 08/11/14 0555  08/13/14 0415 08/14/14 0436 08/15/14 0420 08/16/14 0459  NA 138  < > 132*  --  135* 134*  K 3.7  < > 4.0  --  3.9 3.9  CL 104  < > 100  --  102 103  CO2 24  < > 20  --  22 21  BUN 11  < > 9  --  9 8  CREATININE 0.50  < > 0.51 0.50 0.54 0.51  CALCIUM 8.4  < > 8.1*  --  8.5 8.3*  MG 1.9  --   --   --   --   --   GLUCOSE 80  < > 90  --  69* 83  < > = values in this interval not displayed.  CBG (last 3)   Recent Labs  08/16/14 1647 08/16/14 2113 08/17/14 0748  GLUCAP 98 71 99    Scheduled Meds: . bisacodyl  10 mg Rectal Daily  . enoxaparin (LOVENOX) injection  40 mg Subcutaneous Daily  . insulin aspart  0-9 Units Subcutaneous TID AC & HS  . levofloxacin  750 mg Oral Daily  .  OxyCODONE  20 mg Oral Q12H  . pantoprazole (PROTONIX) IV  40 mg Intravenous QHS  . polyethylene glycol  17 g Oral BID    Continuous Infusions: . bupivacaine 0.25 % ON-Q pump DUAL CATH 300 mL    . dextrose 5 % and 0.45 % NaCl with KCl 40 mEq/L 30 mL/hr (08/17/14 0730)   Brynda Greathouse, MS RD LDN Clinical Inpatient Dietitian Weekend/After hours pager: [phone]

## 2014-08-17 NOTE — Plan of Care (Signed)
Problem: Phase III Progression Outcomes Goal: Pain controlled on oral analgesia Outcome: Progressing     

## 2014-08-18 LAB — GLUCOSE, CAPILLARY
GLUCOSE-CAPILLARY: 83 mg/dL (ref 70–99)
GLUCOSE-CAPILLARY: 69 mg/dL — AB (ref 70–99)
GLUCOSE-CAPILLARY: 93 mg/dL (ref 70–99)
Glucose-Capillary: 80 mg/dL (ref 70–99)
Glucose-Capillary: 78 mg/dL (ref 70–99)

## 2014-08-18 LAB — CULTURE, BLOOD (SINGLE)

## 2014-08-18 MED ORDER — OXYCODONE HCL 5 MG PO TABS
10.0000 mg | ORAL_TABLET | ORAL | Status: DC | PRN
  Administered 2014-08-18: 10 mg via ORAL
  Administered 2014-08-19 – 2014-08-20 (×4): 15 mg via ORAL
  Filled 2014-08-18: qty 3
  Filled 2014-08-18: qty 2
  Filled 2014-08-18 (×3): qty 3

## 2014-08-18 NOTE — Progress Notes (Signed)
Patient ID: Melinda Conrad, female   DOB: [birth year], 63 y.o.   MRN: [id number]  General Surgery - [facility] - Progress Note   Subjective: Pt still requiring some breakthrough dilaudid for pain but states pain control better.Tolerating diet.  Seems to be eating better.  Drinking supplements between meals.    Objective: Vital signs in last 24 hours: Temp:  [98.7 F (37.1 C)-99.1 F (37.3 C)] 98.7 F (37.1 C) (11/28 0531) Pulse Rate:  [69-79] 69 (11/28 0531) Resp:  [16] 16 (11/28 0531) BP: (111-112)/(56-65) 112/57 mmHg (11/28 0531) SpO2:  [97 %-100 %] 99 % (11/28 0531) Last BM Date: 08/17/14  Intake/Output from previous day: 11/27 0701 - 11/28 0700 In: 1440 [P.O.:840; I.V.:600] Out: 2075 [Urine:2075]  Exam: HEENT - clear, not icteric Chest - breathing comfortably Abd - mild distension Ext - no significant edema Neuro - grossly intact, no focal deficits  Lab Results:   Recent Labs  08/16/14 0459  WBC 10.5  HGB 7.0*  HCT 21.1*  PLT 310     Recent Labs  08/16/14 0459  NA 134*  K 3.9  CL 103  CO2 21  GLUCOSE 83  BUN 8  CREATININE 0.51  CALCIUM 8.3*    Studies/Results: No results found.  Assessment / Plan: 1.  Status post Whipple procedure  Diet as tolerated.   Oxycontin/oxycodone: will increase oxycodone to 10-15mg  q4h to help with her pain control and d/c IV dilaudid.  If she tolerates this today, I believe she will be ready for d/c tom  2.  Pneumonia  RLL pneumonia on CXR  Pt completes course today    IS use, ambulation  3.  Hypokalemia  Improved.  Stable  4.  Severe protein calorie malnutrition.   Impact study supplements.  Diet as tolerated  5. Acute blood loss anemia: Hgb stable, no tachycardia or other signs of symptomatic anemia  5.  Cont PT and ambulation.  Hopefully d/c tom.     [facility] Office: [phone]  08/18/2014

## 2014-08-18 NOTE — Plan of Care (Signed)
Problem: Phase III Progression Outcomes Goal: Activity at appropriate level-compared to baseline (UP IN CHAIR FOR HEMODIALYSIS)  Outcome: Progressing     

## 2014-08-18 NOTE — Plan of Care (Signed)
Problem: Phase III Progression Outcomes Goal: IV changed to normal saline lock Outcome: Completed/Met Date Met:  08/18/14

## 2014-08-19 LAB — GLUCOSE, CAPILLARY
GLUCOSE-CAPILLARY: 89 mg/dL (ref 70–99)
Glucose-Capillary: 90 mg/dL (ref 70–99)
Glucose-Capillary: 78 mg/dL (ref 70–99)
Glucose-Capillary: 121 mg/dL — ABNORMAL HIGH (ref 70–99)
Glucose-Capillary: 60 mg/dL — ABNORMAL LOW (ref 70–99)
Glucose-Capillary: 78 mg/dL (ref 70–99)

## 2014-08-19 MED ORDER — ENOXAPARIN SODIUM 40 MG/0.4ML ~~LOC~~ SOLN
40.0000 mg | SUBCUTANEOUS | Status: DC
Start: 2014-08-19 — End: 2014-08-21
  Administered 2014-08-19: 40 mg via SUBCUTANEOUS
  Filled 2014-08-19 (×3): qty 0.4

## 2014-08-19 MED ORDER — BISACODYL 10 MG RE SUPP
10.0000 mg | Freq: Once | RECTAL | Status: AC
  Administered 2014-08-19: 10 mg via RECTAL
  Filled 2014-08-19: qty 1

## 2014-08-19 MED ORDER — AMLODIPINE BESYLATE 5 MG PO TABS
5.0000 mg | ORAL_TABLET | Freq: Every day | ORAL | Status: DC
  Administered 2014-08-21: 5 mg via ORAL
  Filled 2014-08-19 (×3): qty 1

## 2014-08-19 MED ORDER — SERTRALINE HCL 100 MG PO TABS
100.0000 mg | ORAL_TABLET | Freq: Every morning | ORAL | Status: DC
  Administered 2014-08-20 – 2014-08-21 (×2): 100 mg via ORAL
  Filled 2014-08-19 (×3): qty 1

## 2014-08-19 MED ORDER — IBUPROFEN 400 MG PO TABS
400.0000 mg | ORAL_TABLET | Freq: Three times a day (TID) | ORAL | Status: DC
  Administered 2014-08-19 – 2014-08-21 (×7): 400 mg via ORAL
  Filled 2014-08-19 (×9): qty 1

## 2014-08-19 MED ORDER — ACETAMINOPHEN 160 MG/5ML PO SOLN
650.0000 mg | Freq: Four times a day (QID) | ORAL | Status: DC
  Administered 2014-08-19 – 2014-08-21 (×8): 650 mg via ORAL
  Filled 2014-08-19 (×9): qty 20.3

## 2014-08-19 NOTE — Plan of Care (Signed)
Problem: Phase III Progression Outcomes Goal: Pain controlled on oral analgesia Outcome: Completed/Met Date Met:  08/19/14 Goal: Activity at appropriate level-compared to baseline (UP IN CHAIR FOR HEMODIALYSIS)  Outcome: Completed/Met Date Met:  08/19/14 Goal: Discharge plan remains appropriate-arrangements made Outcome: Adequate for Discharge Goal: Other Phase III Outcomes/Goals Outcome: Completed/Met Date Met:  08/19/14  Problem: Discharge Progression Outcomes Goal: Hemodynamically stable Outcome: Completed/Met Date Met:  08/19/14

## 2014-08-19 NOTE — Plan of Care (Signed)
Problem: Phase III Progression Outcomes Goal: Discharge plan remains appropriate-arrangements made Outcome: Completed/Met Date Met:  08/19/14  Problem: Discharge Progression Outcomes Goal: Tubes and drains discontinued if indicated Outcome: Completed/Met Date Met:  08/19/14

## 2014-08-19 NOTE — Progress Notes (Signed)
Patient ID: Melinda Conrad, female   DOB: May 13, 1951, 63 y.o.   MRN: [id number]  General Surgery - [facility] - Progress Note   Subjective: Pt tolerated pain on all PO meds yesterday but does no feel well.  She does not want to go home.  She states that she is in too much pain.  She is tolerating diet.  Seems to be eating ok.  Drinking supplements between meals.  Hasn't had a BM in a couple days.   Objective: Vital signs in last 24 hours: Temp:  [98.4 F (36.9 C)-99.4 F (37.4 C)] 98.9 F (37.2 C) (11/29 0615) Pulse Rate:  [74-85] 78 (11/29 0615) Resp:  [16-20] 20 (11/29 0615) BP: (106-119)/(62-66) 108/66 mmHg (11/29 0615) SpO2:  [100 %] 100 % (11/29 0615) Last BM Date: 08/17/14  Intake/Output from previous day: 11/28 0701 - 11/29 0700 In: 1200 [P.O.:1200] Out: 1350 [Urine:1350]  Exam: HEENT - clear, not icteric Chest - breathing comfortably Abd - no distension, soft Wound packed, looks good Ext - no significant edema Neuro - grossly intact, no focal deficits  Lab Results:  No results for input(s): WBC, HGB, HCT, PLT in the last 72 hours.  No results for input(s): NA, K, CL, CO2, GLUCOSE, BUN, CREATININE, CALCIUM in the last 72 hours.  Studies/Results: No results found.  Assessment / Plan: 1.  Status post Whipple procedure  Diet as tolerated.   Oxycontin/oxycodone: will cont oxycodone 10-15mg  q4h to help with her pain control and add some scheduled tylenol and ibuprofen.  Hopefully she will be ready for d/c tom  2.  Pneumonia  RLL pneumonia on CXR  Pt completed course of antibiotics  IS use, ambulation  3.  Hypokalemia  Improved.  Stable  4.  Severe protein calorie malnutrition.   Impact study supplements.  Diet as tolerated  5. Acute blood loss anemia: Hgb stable, no tachycardia or other signs of symptomatic anemia  5.  Cont PT and ambulation.  Hopefully d/c tom.     [facility] Office:  [phone]  08/19/2014

## 2014-08-20 LAB — BASIC METABOLIC PANEL
Anion gap: 13 (ref 5–15)
BUN: 12 mg/dL (ref 6–23)
CHLORIDE: 96 meq/L (ref 96–112)
CO2: 22 meq/L (ref 19–32)
Calcium: 8.9 mg/dL (ref 8.4–10.5)
Creatinine, Ser: 0.65 mg/dL (ref 0.50–1.10)
GFR calc Af Amer: >90 mL/min (ref >90)
GLUCOSE: 94 mg/dL (ref 70–99)
POTASSIUM: 3.6 meq/L — AB (ref 3.7–5.3)
SODIUM: 131 meq/L — AB (ref 137–147)

## 2014-08-20 LAB — GLUCOSE, CAPILLARY
GLUCOSE-CAPILLARY: 78 mg/dL (ref 70–99)
GLUCOSE-CAPILLARY: 93 mg/dL (ref 70–99)
Glucose-Capillary: 84 mg/dL (ref 70–99)

## 2014-08-20 LAB — CBC
HEMATOCRIT: 24.1 % — AB (ref 36.0–46.0)
HEMOGLOBIN: 7.8 g/dL — AB (ref 12.0–15.0)
MCH: 30.2 pg (ref 26.0–34.0)
MCHC: 32.4 g/dL (ref 30.0–36.0)
MCV: 93.4 fL (ref 78.0–100.0)
Platelets: 503 10*3/uL — ABNORMAL HIGH (ref 150–400)
RBC: 2.58 MIL/uL — AB (ref 3.87–5.11)
RDW: 13.7 % (ref 11.5–15.5)
WBC: 7 10*3/uL (ref 4.0–10.5)

## 2014-08-20 MED ORDER — OXYCODONE HCL 5 MG PO TABS
15.0000 mg | ORAL_TABLET | ORAL | Status: DC | PRN
  Administered 2014-08-20: 15 mg via ORAL
  Administered 2014-08-20 – 2014-08-21 (×2): 20 mg via ORAL
  Filled 2014-08-20: qty 3
  Filled 2014-08-20: qty 4
  Filled 2014-08-20: qty 3

## 2014-08-20 MED ORDER — ALUM & MAG HYDROXIDE-SIMETH 200-200-20 MG/5ML PO SUSP
30.0000 mL | Freq: Four times a day (QID) | ORAL | Status: DC | PRN
  Administered 2014-08-20: 30 mL via ORAL
  Filled 2014-08-20: qty 30

## 2014-08-20 MED ORDER — LACTATED RINGERS IV BOLUS (SEPSIS)
1000.0000 mL | Freq: Once | INTRAVENOUS | Status: AC
  Administered 2014-08-20: 1000 mL via INTRAVENOUS

## 2014-08-20 NOTE — Progress Notes (Signed)
Patient ID: Melinda Conrad, female   DOB: [birth year], 63 y.o.   MRN: [id number]  General Surgery - [facility] - Progress Note   Subjective: Pt is still having a lot of pain.  She denies nausea, but still is taking pain meds all the times that she can.  She also had an episode of hypotension this am.    Objective: Vital signs in last 24 hours: Temp:  [97.7 F (36.5 C)-98.2 F (36.8 C)] 98.2 F (36.8 C) (11/30 1400) Pulse Rate:  [58-70] 70 (11/30 1400) Resp:  [16-18] 18 (11/30 1400) BP: (84-90)/(50-55) 90/55 mmHg (11/30 1400) SpO2:  [97 %-100 %] 97 % (11/30 1400) Last BM Date: 08/17/14  Intake/Output from previous day: 11/29 0701 - 11/30 0700 In: 720 [P.O.:720] Out: 250 [Urine:250]  Exam: HEENT - clear, not icteric Chest - breathing comfortably Abd - no distension, soft Wound packed, looks good Ext - no significant edema Neuro - grossly intact, no focal deficits  Lab Results:   Recent Labs  08/20/14 0515  WBC 7.0  HGB 7.8*  HCT 24.1*  PLT 503*     Recent Labs  08/20/14 0515  NA 131*  K 3.6*  CL 96  CO2 22  GLUCOSE 94  BUN 12  CREATININE 0.65  CALCIUM 8.9    Studies/Results: No results found.  Assessment / Plan: 1.  Status post Whipple procedure  Diet as tolerated.   Oxycontin/oxycodone: will cont oxycodone.  Increased IR to 15-20 q4 hours on top of the oxycontin.   2.  Pneumonia  RLL pneumonia on CXR  Pt completed course of antibiotics  IS use, ambulation  3.  Hypokalemia  Improved.  Stable  4.  Severe protein calorie malnutrition.   Impact study supplements.  Diet as tolerated  5. Acute blood loss anemia: Hgb stable, no tachycardia or other signs of symptomatic anemia  5.  Cont PT and ambulation.  Hopefully d/c tom.     [facility] Office: [phone]  08/20/2014

## 2014-08-20 NOTE — Progress Notes (Signed)
Physical Therapy Treatment Patient Details Name: Melinda Conrad MRN: [id number] DOB: Jun 05, 1951 Today's Date: 08/20/2014    History of Present Illness 63 yo female s/p whipple.     PT Comments    Pt was able to complete stair training this tx and tolerate 600 feet of gait training; 400 feet without an AD.  Pt was supervision to min guard A with all mobility.    Follow Up Recommendations  No PT follow up  going to daughters home   Equipment Recommendations  None recommended by PT    Recommendations for Other Services       Precautions / Restrictions Precautions Precautions: None Restrictions Weight Bearing Restrictions: No    Mobility  Bed Mobility Overal bed mobility: Needs Assistance Bed Mobility: Supine to Sit     Supine to sit: Supervision     General bed mobility comments: VCs for forward scoot  Transfers Overall transfer level: Modified independent Equipment used: Rolling walker (2 wheeled) Transfers: Sit to/from Stand Sit to Stand: Modified independent (Device/Increase time)         General transfer comment: pt slightly impulsive; stands without cueing; BP 95/50 in supine, 84/56 at EOB; 94/54 standing, pt asymptomatic  Ambulation/Gait Ambulation/Gait assistance: Min guard Ambulation Distance (Feet): 600 Feet Assistive device: Rolling walker (2 wheeled);None Gait Pattern/deviations: Step-through pattern     General Gait Details: 200 feet with RW, then 400 feet without AD; no complaints of dizziness or nausea   Stairs Stairs: Yes Stairs assistance: Min guard Stair Management: One rail Left;Alternating pattern;Step to pattern;Forwards Number of Stairs: 8 General stair comments: step through for ascent, step to for descent; VCs for use of handrail for safety  Wheelchair Mobility    Modified Rankin (Stroke Patients Only)       Balance                                    Cognition Arousal/Alertness: Awake/alert Behavior  During Therapy: WFL for tasks assessed/performed Overall Cognitive Status: Within Functional Limits for tasks assessed                      Exercises      General Comments        Pertinent Vitals/Pain Pain Assessment: 0-10 Pain Score: 6  Pain Location: abdomen Pain Intervention(s): Monitored during session;Repositioned;Premedicated before session    Home Living                      Prior Function            PT Goals (current goals can now be found in the care plan section) Progress towards PT goals: Progressing toward goals    Frequency  Min 3X/week    PT Plan Current plan remains appropriate    Co-evaluation             End of Session Equipment Utilized During Treatment: Gait belt Activity Tolerance: Patient tolerated treatment well Patient left: in chair;with call bell/phone within reach     Time: 1022-1047 PT Time Calculation (min) (ACUTE ONLY): 25 min  Charges:                       G Codes:      Miller,Derrick, SPTA 08/20/2014, 12:54 PM   Reviewed above  Rica Koyanagi  PTA [facility]  Acute  Rehab Pager  [phone]    

## 2014-08-21 LAB — GLUCOSE, CAPILLARY
GLUCOSE-CAPILLARY: 72 mg/dL (ref 70–99)
Glucose-Capillary: 100 mg/dL — ABNORMAL HIGH (ref 70–99)
Glucose-Capillary: 59 mg/dL — ABNORMAL LOW (ref 70–99)
Glucose-Capillary: 62 mg/dL — ABNORMAL LOW (ref 70–99)
Glucose-Capillary: 81 mg/dL (ref 70–99)

## 2014-08-21 MED ORDER — OXYCODONE HCL ER 20 MG PO T12A: 20.0000 mg | EXTENDED_RELEASE_TABLET | Freq: Two times a day (BID) | ORAL | Status: DC

## 2014-08-21 MED ORDER — ALUM & MAG HYDROXIDE-SIMETH 200-200-20 MG/5ML PO SUSP: 30.0000 mL | Freq: Four times a day (QID) | ORAL | Status: DC | PRN

## 2014-08-21 MED ORDER — OXYCODONE HCL 5 MG PO TABS: 15.0000 mg | ORAL_TABLET | ORAL | Status: DC | PRN

## 2014-08-21 MED ORDER — BISACODYL 10 MG RE SUPP: 10.0000 mg | RECTAL | Status: DC | PRN

## 2014-08-21 NOTE — Discharge Summary (Signed)
Reviewed discharge instructions with patient and daughter.  Demonstrated and instructed daughter/pt in dressing change and sent supplies home with them.  Emphasized to pt & daughter importance of adequate nutrition and risks of hypoglycemia.  Advised daughter of pt's poor po intake and low blood sugar.  Daughter said she is diabetic and monitors her own blood sugar.  Michela Pitcher pt will be staying with her for next 2 weeks and she will monitor and encourage pt to [place].  Answered all questions and pt/daughter verbalized understanding.  Pt d/c into care of daughter.

## 2014-08-21 NOTE — Discharge Summary (Signed)
Physician Discharge Summary  Patient ID: Melinda Conrad MRN: [id number] DOB/AGE: 61-Sep-1952 61 y.o.  Admit date: 08/06/2014 Discharge date: 08/21/2014  Admission Diagnoses: Adenocarcinoma of head of pancreas Severe protein calorie malnutrition. Pain  Discharge Diagnoses:  Principal Problem:   Adenocarcinoma of head of pancreas Active Problems:   Pancreas cancer   Protein-calorie malnutrition, severe Pneumonia, resolved.  Hypokalemia, resolved ABL anemia, stable  Discharged Condition: stable  Hospital Course:  Pt was admitted to the ICU following a whipple.  She had significant pre op pain and we had to do custom dose PCA dilaudid to control pain.  She was transferred to the floor on POD 2, her NGT was removed, and she was started on clears.  She was slow to advance her diet.  She also developed fevers and was found to have a pneumonia.  She slowly improved on antibiotics.  She took Impact supplements while in house as well.  She had both her drains removed.  She did develop a small portion of her wound that needed to be opened at the lower portion.  She received packing in this location.  She was ambulatory with assistance.  She required oxycontin and oxycodone for pain control (this was true pre op as well).  She continued to slowly improve.  She is discharged in stable condition.    Consults: None  Significant Diagnostic Studies: labs: see epic for full description.  Normal WBCs prior to d/c.  HCT 24 prior to d/c.    Treatments: surgery: see above.    Discharge Exam: Blood pressure 127/73, pulse 77, temperature 98.4 F (36.9 C), temperature source Oral, resp. rate 16, height 5\' 3"  (1.6 m), weight 118 lb 6.2 oz (53.7 kg), SpO2 99 %. General appearance: alert, cooperative and no distress Resp: breathing comfortabl7y GI: soft, wound base clean  Disposition: 01-Home or Self Care  Discharge Instructions    Call MD for:  difficulty breathing, headache or visual disturbances     Complete by:  As directed      Call MD for:  persistant nausea and vomiting    Complete by:  As directed      Call MD for:  redness, tenderness, or signs of infection (pain, swelling, redness, odor or green/yellow discharge around incision site)    Complete by:  As directed      Call MD for:  severe uncontrolled pain    Complete by:  As directed      Call MD for:  temperature >100.4    Complete by:  As directed      Change dressing (specify)    Complete by:  As directed   Dressing change: one time per day using wet to dry NS for abdominal wound.     Diet - low sodium heart healthy    Complete by:  As directed      Increase activity slowly    Complete by:  As directed             Medication List    STOP taking these medications        ondansetron 4 MG disintegrating tablet  Commonly known as:  ZOFRAN-ODT      TAKE these medications        alum & mag hydroxide-simeth 200-200-20 MG/5ML suspension  Commonly known as:  MAALOX/MYLANTA  Take 30 mLs by mouth every 6 (six) hours as needed for indigestion or heartburn.     amLODipine 5 MG tablet  Commonly known as:  NORVASC  Take 5 mg by mouth daily.     bisacodyl 10 MG suppository  Commonly known as:  DULCOLAX  Place 1 suppository (10 mg total) rectally every three (3) days as needed (no BM).     CALCIUM-VITAMIN D PO  Take 1 capsule by mouth daily.     ENSURE  Take 237 mLs by mouth 3 (three) times daily between meals.     hyaluronate sodium Gel  Apply 1 application topically 2 (two) times daily.     ibuprofen 800 MG tablet  Commonly known as:  ADVIL,MOTRIN  Take 800 mg by mouth 2 (two) times daily as needed for mild pain or moderate pain (pain).     metoCLOPramide 10 MG tablet  Commonly known as:  REGLAN  Take 1 tablet (10 mg total) by mouth every 6 (six) hours.     omeprazole 20 MG capsule  Commonly known as:  PRILOSEC  Take 40 mg by mouth daily.     oxyCODONE 5 MG immediate release tablet  Commonly known as:   ROXICODONE  Take 1 or 2 po Q 4  for pain     oxyCODONE 5 MG immediate release tablet  Commonly known as:  Oxy IR/ROXICODONE  Take 3-4 tablets (15-20 mg total) by mouth every 4 (four) hours as needed for severe pain.     OxyCODONE 20 mg T12a 12 hr tablet  Commonly known as:  OXYCONTIN  Take 1 tablet (20 mg total) by mouth every 12 (twelve) hours.     polyethylene glycol packet  Commonly known as:  MIRALAX / GLYCOLAX  Take 17 g by mouth daily.     psyllium 0.52 G capsule  Commonly known as:  REGULOID  Take 1 capsule (0.52 g total) by mouth daily.     sertraline 100 MG tablet  Commonly known as:  ZOLOFT  Take 100 mg by mouth every morning.     sorbitol 70 % solution  Take 15 mLs by mouth 2 (two) times daily. Increase to 30 ml twice a day for constipation         Signed: Yuta Cipollone 08/21/2014, 7:56 AM

## 2014-08-21 NOTE — Plan of Care (Signed)
Problem: Consults Goal: General Surgical Patient Education (See Patient Education module for education specifics)  Outcome: Completed/Met Date Met:  08/21/14  Problem: Discharge Progression Outcomes Goal: Barriers To Progression Addressed/Resolved Outcome: Completed/Met Date Met:  79/98/72 Goal: Complications resolved/controlled Outcome: Completed/Met Date Met:  08/21/14 Goal: Activity appropriate for discharge plan Outcome: Completed/Met Date Met:  08/21/14 Goal: Staples/sutures removed Outcome: Completed/Met Date Met:  08/21/14 Goal: Steri-Strips applied Outcome: Completed/Met Date Met:  08/21/14 Goal: Other Discharge Outcomes/Goals Outcome: Completed/Met Date Met:  08/21/14

## 2014-08-21 NOTE — Discharge Instructions (Signed)
[facility]      [facility] °[phone] ° °ABDOMINAL SURGERY: POST OP INSTRUCTIONS ° °Always review your discharge instruction sheet given to you by the facility where your surgery was performed. ° °IF YOU HAVE DISABILITY OR FAMILY LEAVE FORMS, YOU MUST BRING THEM TO THE OFFICE FOR PROCESSING.  PLEASE DO NOT GIVE THEM TO YOUR DOCTOR. ° °1. A prescription for pain medication may be given to you upon discharge.  Take your pain medication as prescribed, if needed.  If narcotic pain medicine is not needed, then you may take acetaminophen (Tylenol) or ibuprofen (Advil) as needed. °2. Take your usually prescribed medications unless otherwise directed. °3. If you need a refill on your pain medication, please contact your pharmacy. They will contact our office to request authorization.  Prescriptions will not be filled after 5pm or on week-ends. °4. You should follow a light diet the first few days after arrival home, such as soup and crackers, pudding, etc.unless your doctor has advised otherwise. A high-fiber, low fat diet can be resumed as tolerated.   Be sure to include lots of fluids daily. Most patients will experience some swelling and bruising on the chest and neck area.  Ice packs will help.  Swelling and bruising can take several days to resolve °5. Most patients will experience some swelling and bruising in the area of the incision. Ice pack will help. Swelling and bruising can take several days to resolve..  °6. It is common to experience some constipation if taking pain medication after surgery.  Increasing fluid intake and taking a stool softener will usually help or prevent this problem from occurring.  A mild laxative (Milk of Magnesia or Miralax) should be taken according to package directions if there are no bowel movements after 48 hours. °7.  You may have steri-strips (small skin tapes) in place directly over the incision.  These strips should be left on the skin for 10-14 days.  If your  surgeon used skin glue on the incision, you may shower in 48 hours.  The glue will flake off over the next 2-3 weeks.  Any sutures or staples will be removed at the office during your follow-up visit. You may find that a light gauze bandage over your incision may keep your staples from being rubbed or pulled. You may shower and replace the bandage daily. °8. ACTIVITIES:  You may resume regular (light) daily activities beginning the next day--such as daily self-care, walking, climbing stairs--gradually increasing activities as tolerated.  You may have sexual intercourse when it is comfortable.  Refrain from any heavy lifting or straining until approved by your doctor. °a. You may drive when you no longer are taking prescription pain medication, you can comfortably wear a seatbelt, and you can safely maneuver your car and apply brakes °b. Return to Work: __________8 weeks if applicable_________________________ °9. You should see your doctor in the office for a follow-up appointment approximately two weeks after your surgery.  Make sure that you call for this appointment within a day or two after you arrive home to insure a convenient appointment time. °OTHER INSTRUCTIONS:  °_____________________________________________________________ °_____________________________________________________________ ° °WHEN TO CALL YOUR DOCTOR: °1. Fever over 101.0 °2. Inability to urinate °3. Nausea and/or vomiting °4. Extreme swelling or bruising °5. Continued bleeding from incision. °6. Increased pain, redness, or drainage from the incision. °7. Difficulty swallowing or breathing °8. Muscle cramping or spasms. °9. Numbness or tingling in hands or feet or around lips. ° °The clinic staff is   available to answer your questions during regular business hours.  Please don’t hesitate to call and ask to speak to one of the nurses if you have concerns. ° °For further questions, please visit [email or web address] ° ° ° °

## 2014-08-22 LAB — GLUCOSE, CAPILLARY: Glucose-Capillary: 89 mg/dL (ref 70–99)

## 2014-09-10 ENCOUNTER — Telehealth: Payer: Self-pay | Admitting: *Deleted

## 2014-09-10 ENCOUNTER — Telehealth: Payer: Self-pay | Admitting: Oncology

## 2014-09-10 ENCOUNTER — Ambulatory Visit (HOSPITAL_BASED_OUTPATIENT_CLINIC_OR_DEPARTMENT_OTHER): Payer: Medicaid Other | Admitting: Oncology

## 2014-09-10 ENCOUNTER — Other Ambulatory Visit: Payer: Self-pay | Admitting: *Deleted

## 2014-09-10 ENCOUNTER — Ambulatory Visit: Payer: Medicaid Other | Admitting: Nutrition

## 2014-09-10 VITALS — BP 97/56 | HR 77 | Temp 99.3°F | Resp 18 | Ht 63.0 in | Wt 109.6 lb

## 2014-09-10 DIAGNOSIS — C259 Malignant neoplasm of pancreas, unspecified: Secondary | ICD-10-CM

## 2014-09-10 MED ORDER — OXYCODONE HCL 5 MG PO TABS: ORAL_TABLET | ORAL | Status: DC

## 2014-09-10 NOTE — Progress Notes (Signed)
Follow-up completed with patient and daughter. Patient status post Whipple surgery on November 17. Weight documented as 118.3 pounds on November 17.  Patient has experienced weight loss with weight documented as 109.6 pounds December 21. Patient is trying to increase oral intake.  Denies nutrition impact symptoms.  Will drink Ensure Plus.  Nutrition diagnosis: Unintended weight loss continues.  Intervention: Patient educated to continue high-calorie high-protein foods. Educated patient to increase Ensure Plus 3 times a day between meals. Provided one complementary case of Ensure Plus. Questions were answered.  Teach back method used.  Monitoring, evaluation, goals: Patient will work to increase oral intake to promote weight gain and improve healing.  Next visit: Thursday, January 7, during chemotherapy.  **Disclaimer: This note was dictated with voice recognition software. Similar sounding words can inadvertently be transcribed and this note may contain transcription errors which may not have been corrected upon publication of note.**

## 2014-09-10 NOTE — Progress Notes (Signed)
  [facility] OFFICE PROGRESS NOTE   Diagnosis: Pancreas cancer  INTERVAL HISTORY:   Ms. Dula underwent a pancreaticoduodenectomy procedure 08/07/2014.she was discharged 08/21/2014. The abdominal wound is healing. She reports improvement in abdominal pain. She is no longer taking OxyContin. The pathology ([id number])confirmed multiple foci of tumor in the head of the pancreas with the largest measuring 1 cm. This was a grade 3 invasive ductal carcinoma. Tumor invaded into the peripancreatic soft tissue. The resection margins were negative.the superior mesenteric vein margin was 0.1 cm. A moderate treatment response was noted. Lymphovascular invasion was not identified. There was perineural invasion. One lymph node was involved with metastatic carcinoma by direct extension.  She reports a good appetite.    Objective:  Vital signs in last 24 hours:  Blood pressure 97/56, pulse 77, temperature 99.3 F (37.4 C), temperature source Oral, resp. rate 18, height 5\' 3"  (1.6 m), weight 109 lb 9.6 oz (49.714 kg), SpO2 100 %.    HEENT: neck without mass Lymphatics: no cervical or supraclavicular nodes Resp: lungs clear bilaterally Cardio: regular rate and rhythm GI: no hepatomegaly, nontender, no mass. Healing midline incision with a superficial opening. Vascular: no leg edema   Lab Results:  Lab Results  Component Value Date   WBC 7.0 08/20/2014   HGB 7.8* 08/20/2014   HCT 24.1* 08/20/2014   MCV 93.4 08/20/2014   PLT 503* 08/20/2014   NEUTROABS 2.5 08/07/2014    Medications: I have reviewed the patient's current medications.  Assessment/Plan: 1. Adenocarcinoma of the head of the pancreas, clinical stage IIa(T3 N0), status post an ERCP brush of the common bile duct stricture 03/06/2014 confirming adenocarcinoma  EUS 04/05/2012 consistent with a uT3N0 lesion, ultrasound evidence for invasion of the portal vein   Staging MRI of the abdomen 03/05/2014 confirmed a  pancreas mass abutting the undersurface of the proximal main portal vein   Initiation of concurrent capecitabine and radiation on 04/30/2014; completed 06/08/2014. 2. Pancreaticoduodenectomy 11/17/2015confirmed a pathologic ypT3,ypN1tumor with negative surgical margins 3. constipation-likely secondary to narcotic analgesics, improved  4. anorexia/weight loss  5. tobacco and alcohol use  6. history of a colon polyp in February 2015  7. depression 8. Pain secondary to pancreas cancer-improved   Disposition:  Ms. Calligan is recovering from the Whipple procedure. I discussed the surgical pathology with Ms. Beauchamp and her daughter.she has a significant chance of developing recurrent pancreas cancer. I recommend adjuvant gemcitabine chemotherapy. We reviewed the potential toxicities associated with gemcitabine including the chance for hematologic toxicity, an allergic reaction, rash, and pneumonitis. She agrees to proceed. We will refer her to Dr. Barry Dienes for placement of Port-A-Cath. A first treatment with gemcitabine will be scheduled for 09/27/2013.  She has discontinued OxyContin. We refilled a prescription for oxycodone.    Betsy Coder, MD  09/10/2014  12:52 PM

## 2014-09-10 NOTE — Telephone Encounter (Signed)
S/w Pamala Hurry w/[facility] she is going to have [facility] call me back to sch pt w/Dr. Barry Dienes to have port added. Pt was scheduled 12/18 w/Dr. Barry Dienes but missed her apt. S/w Scheduler while pt was there advised waiting to hear from [facility] to set up apt and that her schedule has been made for 01/07 and msg was sent to add chemo.... [facility] will mail out sch once completed.Marland KitchenMarland KitchenMarland Kitchen

## 2014-09-10 NOTE — Telephone Encounter (Signed)
Per staff message and POF I have scheduled appts. Advised scheduler of appts. JMW  

## 2014-09-11 NOTE — Telephone Encounter (Signed)
S/w Bernie Dr. Marlowe Aschoff nurse, MD is out of office and she will staff msg her concerning the referral and will contact me once she hears something back. Pt was a no show with their office 12/18 sent msg to Dr. Benay Spice concerning the matter.

## 2014-09-14 ENCOUNTER — Inpatient Hospital Stay (HOSPITAL_COMMUNITY)
Admission: EM | Admit: 2014-09-14 | Discharge: 2014-10-09 | DRG: 871 | Disposition: A | Discharge status: Home Health | Payer: Medicaid Other | Attending: Internal Medicine | Admitting: Internal Medicine

## 2014-09-14 ENCOUNTER — Emergency Department (HOSPITAL_COMMUNITY): Payer: Medicaid Other

## 2014-09-14 ENCOUNTER — Encounter (HOSPITAL_COMMUNITY): Payer: Self-pay | Admitting: *Deleted

## 2014-09-14 ENCOUNTER — Inpatient Hospital Stay (HOSPITAL_COMMUNITY): Payer: Medicaid Other

## 2014-09-14 DIAGNOSIS — E43 Unspecified severe protein-calorie malnutrition: Secondary | ICD-10-CM | POA: Diagnosis present

## 2014-09-14 DIAGNOSIS — J90 Pleural effusion, not elsewhere classified: Secondary | ICD-10-CM | POA: Diagnosis not present

## 2014-09-14 DIAGNOSIS — K550 Acute vascular disorders of intestine: Secondary | ICD-10-CM | POA: Diagnosis not present

## 2014-09-14 DIAGNOSIS — I472 Ventricular tachycardia: Secondary | ICD-10-CM | POA: Diagnosis not present

## 2014-09-14 DIAGNOSIS — F1099 Alcohol use, unspecified with unspecified alcohol-induced disorder: Secondary | ICD-10-CM | POA: Diagnosis present

## 2014-09-14 DIAGNOSIS — R109 Unspecified abdominal pain: Secondary | ICD-10-CM

## 2014-09-14 DIAGNOSIS — R578 Other shock: Secondary | ICD-10-CM

## 2014-09-14 DIAGNOSIS — Z86010 Personal history of colonic polyps: Secondary | ICD-10-CM

## 2014-09-14 DIAGNOSIS — N179 Acute kidney failure, unspecified: Secondary | ICD-10-CM | POA: Diagnosis present

## 2014-09-14 DIAGNOSIS — E872 Acidosis: Secondary | ICD-10-CM | POA: Diagnosis not present

## 2014-09-14 DIAGNOSIS — J9584 Transfusion-related acute lung injury (TRALI): Secondary | ICD-10-CM | POA: Diagnosis not present

## 2014-09-14 DIAGNOSIS — C250 Malignant neoplasm of head of pancreas: Secondary | ICD-10-CM | POA: Diagnosis present

## 2014-09-14 DIAGNOSIS — R6521 Severe sepsis with septic shock: Secondary | ICD-10-CM | POA: Diagnosis present

## 2014-09-14 DIAGNOSIS — B961 Klebsiella pneumoniae [K. pneumoniae] as the cause of diseases classified elsewhere: Secondary | ICD-10-CM | POA: Diagnosis present

## 2014-09-14 DIAGNOSIS — Z9221 Personal history of antineoplastic chemotherapy: Secondary | ICD-10-CM

## 2014-09-14 DIAGNOSIS — B1920 Unspecified viral hepatitis C without hepatic coma: Secondary | ICD-10-CM | POA: Diagnosis present

## 2014-09-14 DIAGNOSIS — J9601 Acute respiratory failure with hypoxia: Secondary | ICD-10-CM

## 2014-09-14 DIAGNOSIS — Z923 Personal history of irradiation: Secondary | ICD-10-CM | POA: Diagnosis not present

## 2014-09-14 DIAGNOSIS — C259 Malignant neoplasm of pancreas, unspecified: Secondary | ICD-10-CM

## 2014-09-14 DIAGNOSIS — A419 Sepsis, unspecified organism: Secondary | ICD-10-CM

## 2014-09-14 DIAGNOSIS — Z791 Long term (current) use of non-steroidal anti-inflammatories (NSAID): Secondary | ICD-10-CM

## 2014-09-14 DIAGNOSIS — Z6822 Body mass index (BMI) 22.0-22.9, adult: Secondary | ICD-10-CM | POA: Diagnosis not present

## 2014-09-14 DIAGNOSIS — E162 Hypoglycemia, unspecified: Secondary | ICD-10-CM | POA: Diagnosis not present

## 2014-09-14 DIAGNOSIS — R0602 Shortness of breath: Secondary | ICD-10-CM

## 2014-09-14 DIAGNOSIS — K5909 Other constipation: Secondary | ICD-10-CM | POA: Diagnosis not present

## 2014-09-14 DIAGNOSIS — A4159 Other Gram-negative sepsis: Secondary | ICD-10-CM | POA: Diagnosis present

## 2014-09-14 DIAGNOSIS — I959 Hypotension, unspecified: Secondary | ICD-10-CM

## 2014-09-14 DIAGNOSIS — D638 Anemia in other chronic diseases classified elsewhere: Secondary | ICD-10-CM | POA: Diagnosis present

## 2014-09-14 DIAGNOSIS — Z720 Tobacco use: Secondary | ICD-10-CM

## 2014-09-14 DIAGNOSIS — Z90411 Acquired partial absence of pancreas: Secondary | ICD-10-CM | POA: Diagnosis present

## 2014-09-14 DIAGNOSIS — I81 Portal vein thrombosis: Secondary | ICD-10-CM | POA: Diagnosis present

## 2014-09-14 DIAGNOSIS — D696 Thrombocytopenia, unspecified: Secondary | ICD-10-CM | POA: Diagnosis present

## 2014-09-14 DIAGNOSIS — K920 Hematemesis: Secondary | ICD-10-CM | POA: Diagnosis present

## 2014-09-14 DIAGNOSIS — K922 Gastrointestinal hemorrhage, unspecified: Secondary | ICD-10-CM | POA: Diagnosis present

## 2014-09-14 DIAGNOSIS — R64 Cachexia: Secondary | ICD-10-CM

## 2014-09-14 DIAGNOSIS — E876 Hypokalemia: Secondary | ICD-10-CM | POA: Diagnosis present

## 2014-09-14 DIAGNOSIS — F329 Major depressive disorder, single episode, unspecified: Secondary | ICD-10-CM | POA: Diagnosis present

## 2014-09-14 DIAGNOSIS — T39395A Adverse effect of other nonsteroidal anti-inflammatory drugs [NSAID], initial encounter: Secondary | ICD-10-CM | POA: Diagnosis present

## 2014-09-14 DIAGNOSIS — Y832 Surgical operation with anastomosis, bypass or graft as the cause of abnormal reaction of the patient, or of later complication, without mention of misadventure at the time of the procedure: Secondary | ICD-10-CM | POA: Diagnosis present

## 2014-09-14 DIAGNOSIS — R0902 Hypoxemia: Secondary | ICD-10-CM

## 2014-09-14 DIAGNOSIS — T40605A Adverse effect of unspecified narcotics, initial encounter: Secondary | ICD-10-CM | POA: Diagnosis present

## 2014-09-14 DIAGNOSIS — G893 Neoplasm related pain (acute) (chronic): Secondary | ICD-10-CM | POA: Diagnosis present

## 2014-09-14 DIAGNOSIS — R0600 Dyspnea, unspecified: Secondary | ICD-10-CM

## 2014-09-14 DIAGNOSIS — E8770 Fluid overload, unspecified: Secondary | ICD-10-CM | POA: Diagnosis not present

## 2014-09-14 DIAGNOSIS — E89810 Postprocedural hemorrhage and hematoma of an endocrine system organ or structure following an endocrine system procedure: Secondary | ICD-10-CM | POA: Diagnosis present

## 2014-09-14 DIAGNOSIS — R7881 Bacteremia: Secondary | ICD-10-CM | POA: Diagnosis present

## 2014-09-14 DIAGNOSIS — Z95828 Presence of other vascular implants and grafts: Secondary | ICD-10-CM

## 2014-09-14 DIAGNOSIS — R10A0 Flank pain, unspecified side: Secondary | ICD-10-CM

## 2014-09-14 DIAGNOSIS — F32A Depression, unspecified: Secondary | ICD-10-CM | POA: Diagnosis present

## 2014-09-14 DIAGNOSIS — D62 Acute posthemorrhagic anemia: Secondary | ICD-10-CM | POA: Diagnosis present

## 2014-09-14 DIAGNOSIS — Z789 Other specified health status: Secondary | ICD-10-CM

## 2014-09-14 HISTORY — DX: Gastrointestinal hemorrhage, unspecified: K92.2

## 2014-09-14 HISTORY — DX: Other shock: R57.8

## 2014-09-14 LAB — CBC WITH DIFFERENTIAL/PLATELET
Basophils Absolute: 0 10*3/uL (ref 0.0–0.1)
Basophils Relative: 0 % (ref 0–1)
EOS PCT: 0 % (ref 0–5)
Eosinophils Absolute: 0 10*3/uL (ref 0.0–0.7)
HCT: 17.7 % — ABNORMAL LOW (ref 36.0–46.0)
Hemoglobin: 5.8 g/dL — CL (ref 12.0–15.0)
LYMPHS PCT: 4 % — AB (ref 12–46)
Lymphs Abs: 0.8 10*3/uL (ref 0.7–4.0)
MCH: 29.3 pg (ref 26.0–34.0)
MCHC: 32.8 g/dL (ref 30.0–36.0)
MCV: 89.4 fL (ref 78.0–100.0)
MONO ABS: 0.8 10*3/uL (ref 0.1–1.0)
Monocytes Relative: 4 % (ref 3–12)
NEUTROS PCT: 92 % — AB (ref 43–77)
Neutro Abs: 19.2 10*3/uL — ABNORMAL HIGH (ref 1.7–7.7)
PLATELETS: 76 10*3/uL — AB (ref 150–400)
RBC: 1.98 MIL/uL — AB (ref 3.87–5.11)
RDW: 16.3 % — ABNORMAL HIGH (ref 11.5–15.5)
WBC: 20.8 10*3/uL — ABNORMAL HIGH (ref 4.0–10.5)

## 2014-09-14 LAB — COMPREHENSIVE METABOLIC PANEL
ALBUMIN: 1.8 g/dL — AB (ref 3.5–5.2)
ALT: <5 U/L (ref 0–35)
AST: 89 U/L — ABNORMAL HIGH (ref 0–37)
Alkaline Phosphatase: 401 U/L — ABNORMAL HIGH (ref 39–117)
Anion gap: 19 — ABNORMAL HIGH (ref 5–15)
BUN: 28 mg/dL — ABNORMAL HIGH (ref 6–23)
CHLORIDE: 107 meq/L (ref 96–112)
CO2: 13 mmol/L — ABNORMAL LOW (ref 19–32)
CREATININE: 1.18 mg/dL — AB (ref 0.50–1.10)
Calcium: 7.6 mg/dL — ABNORMAL LOW (ref 8.4–10.5)
GFR calc Af Amer: 56 mL/min — ABNORMAL LOW (ref >90)
GFR calc non Af Amer: 49 mL/min — ABNORMAL LOW (ref >90)
Glucose, Bld: 69 mg/dL — ABNORMAL LOW (ref 70–99)
Potassium: 2.6 mmol/L — CL (ref 3.5–5.1)
SODIUM: 139 mmol/L (ref 135–145)
Total Bilirubin: 1.5 mg/dL — ABNORMAL HIGH (ref 0.3–1.2)
Total Protein: 6.3 g/dL (ref 6.0–8.3)

## 2014-09-14 LAB — PREPARE RBC (CROSSMATCH)

## 2014-09-14 LAB — I-STAT CG4 LACTIC ACID, ED: LACTIC ACID, VENOUS: 11.22 mmol/L — AB (ref 0.5–2.2)

## 2014-09-14 LAB — POC OCCULT BLOOD, ED: Fecal Occult Bld: POSITIVE — AB

## 2014-09-14 LAB — LIPASE, BLOOD

## 2014-09-14 LAB — LACTIC ACID, PLASMA: Lactic Acid, Venous: 3.7 mmol/L — ABNORMAL HIGH (ref 0.5–2.2)

## 2014-09-14 MED ORDER — DEXTROSE 5 % IV SOLN
1.0000 g | INTRAVENOUS | Status: DC
  Administered 2014-09-15: 1 g via INTRAVENOUS
  Filled 2014-09-14: qty 10

## 2014-09-14 MED ORDER — KCL IN DEXTROSE-NACL 40-5-0.45 MEQ/L-%-% IV SOLN
INTRAVENOUS | Status: DC
  Administered 2014-09-14: via INTRAVENOUS
  Filled 2014-09-14: qty 1000

## 2014-09-14 MED ORDER — FENTANYL CITRATE 0.05 MG/ML IJ SOLN
12.5000 ug | INTRAMUSCULAR | Status: DC | PRN
Start: 2014-09-14 — End: 2014-09-15
  Administered 2014-09-15 (×6): 25 ug via INTRAVENOUS
  Filled 2014-09-14 (×7): qty 2

## 2014-09-14 MED ORDER — SODIUM CHLORIDE 0.9 % IV BOLUS (SEPSIS)
1000.0000 mL | Freq: Once | INTRAVENOUS | Status: AC
  Administered 2014-09-14: 1000 mL via INTRAVENOUS

## 2014-09-14 MED ORDER — FENTANYL CITRATE 0.05 MG/ML IJ SOLN
25.0000 ug | Freq: Once | INTRAMUSCULAR | Status: AC
Start: 2014-09-14 — End: 2014-09-14
  Administered 2014-09-14: 25 ug via INTRAVENOUS
  Filled 2014-09-14: qty 2

## 2014-09-14 MED ORDER — POTASSIUM CHLORIDE 10 MEQ/100ML IV SOLN
10.0000 meq | INTRAVENOUS | Status: AC
  Administered 2014-09-14 – 2014-09-15 (×6): 10 meq via INTRAVENOUS
  Filled 2014-09-14 (×6): qty 100

## 2014-09-14 MED ORDER — PANTOPRAZOLE SODIUM 40 MG IV SOLR
40.0000 mg | Freq: Two times a day (BID) | INTRAVENOUS | Status: DC
  Administered 2014-09-15 – 2014-09-24 (×20): 40 mg via INTRAVENOUS
  Filled 2014-09-14 (×21): qty 40

## 2014-09-14 MED ORDER — SODIUM CHLORIDE 0.9 % IV SOLN
250.0000 mL | INTRAVENOUS | Status: DC | PRN
  Administered 2014-09-15: 250 mL via INTRAVENOUS

## 2014-09-14 MED ORDER — VITAMINS A & D EX OINT
TOPICAL_OINTMENT | CUTANEOUS | Status: AC
  Administered 2014-09-15
  Filled 2014-09-14: qty 5

## 2014-09-14 MED ORDER — SODIUM CHLORIDE 0.9 % IV SOLN
10.0000 mL/h | Freq: Once | INTRAVENOUS | Status: AC
  Administered 2014-09-14: 10 mL/h via INTRAVENOUS

## 2014-09-14 NOTE — ED Notes (Signed)
Bed: WA06 Expected date:  Expected time:  Means of arrival:  Comments: EMS-Hemoptesis

## 2014-09-14 NOTE — ED Notes (Signed)
Pt is more responsive and reports feeling better.  Pt reports feeling less weak.

## 2014-09-14 NOTE — ED Notes (Signed)
Per EMS report: pt from home: pt is a pancreatic CA pt and has had the head of the pancreas removed on 11/17. Pt is going to start chemo soon. Today pt called EMS for throwing up blood that began approximately 2 hours ago. Pt's emesis was bright red and EMS estimates about 126ml. Pt's daughter reports that pt has dark tarry stools for "several days." Pt a/o 4. Skin warm and dry.

## 2014-09-14 NOTE — ED Notes (Signed)
No reaction to blood products suspected.

## 2014-09-14 NOTE — ED Notes (Addendum)
Pt put on 3L [place] and pulse ox changed to finger with less finger nail polish.  Dr. Vanita Panda informed of pt's drop in O2 sat.  Pt's O2 sat back up to 94%.

## 2014-09-14 NOTE — Procedures (Signed)
PROCEDURE NOTE: CVL PLACEMENT  INDICATION:    Monitoring of central venous pressures and/or administration of medications optimally administered in central vein  CONSENT:   Risks of procedure as well as the alternatives were explained to the patient. Consent for procedure obtained. A time out was performed.   PROCEDURE  Sterile technique was used including antiseptics, cap, gloves, gown, hand hygiene, mask and full body sheet.  Skin prep: Chlorhexidine; local anesthetic administered  A triple lumen catheter was placed in the R IJ vein using the Seldinger technique.  Ultrasound was used for vessel identification and guidance.   EVALUATION:  Blood flow good  Complications: No apparent complications  Patient tolerated the procedure well.  Chest X-ray ordered to verify placement and is pending   Merton Border, MD [facility] service Mobile [phone]

## 2014-09-14 NOTE — ED Notes (Signed)
Dr. Rosita Fire at bedside inserting a central line.

## 2014-09-14 NOTE — ED Provider Notes (Signed)
CSN: [id number]     Arrival date & time 09/14/14  1645 History   First MD Initiated Contact with Patient 09/14/14 1655     Chief Complaint  Patient presents with  . Hemoptysis     (Consider location/radiation/quality/duration/timing/severity/associated sxs/prior Treatment) HPI patient presents with generalized weakness, and new hemoptysis. Weakness has been present for at least one month, hemoptysis is new over the past 3 days. Concurrently, the patient has had progression of her generalized weakness, but no new syncope confusion, disorientation. Patient has a very notable history of metastatic pancreatic cancer, with Whipple procedure one month ago. Last chemotherapy was 2 months ago.   Past Medical History  Diagnosis Date  . Arthritis   . Depression   . H/O: substance abuse   . Back pain   . Personal history of colonic polyp - adenoma 10/26/2013    10/26/2013 diminutive sigmoid polyp removed    . Pancreatitis, acute   . Hypertension   . Osteoporosis   . Radiation 04/30/14-06/08/14    pancreas 50 gray  . Cancer     pancreatic cancer   Past Surgical History  Procedure Laterality Date  . Lumbar epidural injection    . Colonoscopy    . Ercp N/A 03/06/2014    Procedure: ENDOSCOPIC RETROGRADE CHOLANGIOPANCREATOGRAPHY (ERCP);  Surgeon: Gatha Mayer, MD;  Location: [facility] ENDOSCOPY;  Service: Endoscopy;  Laterality: N/A;  . Eus N/A 04/05/2014    Procedure: UPPER ENDOSCOPIC ULTRASOUND (EUS) RADIAL;  Surgeon: [name], MD;  Location: [facility] ENDOSCOPY;  Service: Endoscopy;  Laterality: N/A;  . Whipple procedure N/A 08/07/2014    Procedure:  DIAGNOSTIC LAPAROSCOPY, WHIPPLE PROCEDURE;  Surgeon: Stark Klein, MD;  Location: [facility] ORS;  Service: General;  Laterality: N/A;   Family History  Problem Relation Age of Onset  . Colon cancer Neg Hx   . Esophageal cancer Neg Hx   . Rectal cancer Neg Hx   . Stomach cancer Neg Hx   . CAD Mother   . Cancer Father   . Cirrhosis Sister     she is a  heavy drinker.    History  Substance Use Topics  . Smoking status: Current Every Day Smoker -- 0.25 packs/day for 40 years    Types: Cigarettes  . Smokeless tobacco: Current User  . Alcohol Use: No     Comment: occasionally. Quit  , none in [place] drank before  to help with pain before ERD visit"   OB History    No data available     Review of Systems  Constitutional:       Per HPI, otherwise negative  HENT:       Per HPI, otherwise negative  Respiratory:       Per HPI, otherwise negative  Cardiovascular:       Per HPI, otherwise negative  Gastrointestinal: Positive for nausea, vomiting, abdominal pain and blood in stool.  Endocrine:       Negative aside from HPI  Genitourinary:       Neg aside from HPI   Musculoskeletal:       Per HPI, otherwise negative  Skin: Positive for pallor.  Neurological: Positive for weakness. Negative for seizures, syncope, light-headedness, numbness and headaches.      Allergies  Review of patient's allergies indicates no known allergies.  Home Medications   Prior to Admission medications   Medication Sig Start Date End Date Taking? Authorizing Provider  alum & mag hydroxide-simeth (MAALOX/MYLANTA) 200-200-20 MG/5ML suspension Take 30 mLs by  mouth every 6 (six) hours as needed for indigestion or heartburn. 08/21/14   Stark Klein, MD  amLODipine (NORVASC) 5 MG tablet Take 5 mg by mouth daily.    Historical Provider, MD  bisacodyl (DULCOLAX) 10 MG suppository Place 1 suppository (10 mg total) rectally every three (3) days as needed (no BM). 08/21/14   Stark Klein, MD  CALCIUM-VITAMIN D PO Take 1 capsule by mouth daily.    Historical Provider, MD  ENSURE (ENSURE) Take 237 mLs by mouth 3 (three) times daily between meals.    Historical Provider, MD  hyaluronate sodium (RADIAPLEXRX) GEL Apply 1 application topically 2 (two) times daily. 05/01/14   Blair Promise, MD  ibuprofen (ADVIL,MOTRIN) 800 MG tablet Take 800 mg by mouth 2 (two) times  daily as needed for mild pain or moderate pain (pain).     Historical Provider, MD  metoCLOPramide (REGLAN) 10 MG tablet Take 1 tablet (10 mg total) by mouth every 6 (six) hours. 03/18/14   Mariea Clonts, MD  omeprazole (PRILOSEC) 20 MG capsule Take 40 mg by mouth daily.    Historical Provider, MD  oxyCODONE (ROXICODONE) 5 MG immediate release tablet Take 1-2 tablets by mouth every 6 hours as needed for pain 09/10/14   Ladell Pier, MD  polyethylene glycol [facility] / GLYCOLAX) packet Take 17 g by mouth daily.    Historical Provider, MD  psyllium (REGULOID) 0.52 G capsule Take 1 capsule (0.52 g total) by mouth daily. 05/23/14   Ladell Pier, MD  sertraline (ZOLOFT) 100 MG tablet Take 100 mg by mouth every morning.     Historical Provider, MD  sorbitol 70 % solution Take 15 mLs by mouth 2 (two) times daily. Increase to 30 ml twice a day for constipation 05/23/14   Ladell Pier, MD   BP 66/42 mmHg  Pulse 95  Temp(Src) 98.9 F (37.2 C) (Rectal)  Resp 20  SpO2 99% Physical Exam  Constitutional: She is oriented to person, place, and time. She appears cachectic. She has a sickly appearance.  Ill-appearing female with blood visible on her upper teeth  HENT:  Head: Normocephalic and atraumatic.  Eyes: Conjunctivae and EOM are normal.  Cardiovascular: Regular rhythm.  Tachycardia present.   Pulmonary/Chest: Effort normal and breath sounds normal. No stridor. No respiratory distress.  Abdominal: She exhibits no distension.    Musculoskeletal: She exhibits no edema.  Neurological: She is alert and oriented to person, place, and time. She displays atrophy. She displays no tremor. No cranial nerve deficit. She exhibits abnormal muscle tone. She displays no seizure activity. Coordination normal.  Skin: Skin is warm and dry.  Psychiatric: She has a normal mood and affect.  Nursing note and vitals reviewed.   ED Course  Procedures (including critical care time) Labs Review Labs Reviewed    CBC WITH DIFFERENTIAL - Abnormal; Notable for the following:    WBC 20.8 (*)    RBC 1.98 (*)    Hemoglobin 5.8 (*)    HCT 17.7 (*)    RDW 16.3 (*)    Platelets 76 (*)    Neutrophils Relative % 92 (*)    Lymphocytes Relative 4 (*)    Neutro Abs 19.2 (*)    All other components within normal limits  COMPREHENSIVE METABOLIC PANEL - Abnormal; Notable for the following:    Potassium 2.6 (*)    CO2 13 (*)    Glucose, Bld 69 (*)    BUN 28 (*)    Creatinine,  Ser 1.18 (*)    Calcium 7.6 (*)    Albumin 1.8 (*)    AST 89 (*)    Alkaline Phosphatase 401 (*)    Total Bilirubin 1.5 (*)    GFR calc non Af Amer 49 (*)    GFR calc Af Amer 56 (*)    Anion gap 19 (*)    All other components within normal limits  LIPASE, BLOOD - Abnormal; Notable for the following:    Lipase <10 (*)    All other components within normal limits  I-STAT CG4 LACTIC ACID, ED - Abnormal; Notable for the following:    Lactic Acid, Venous 11.22 (*)    All other components within normal limits  POC OCCULT BLOOD, ED - Abnormal; Notable for the following:    Fecal Occult Bld POSITIVE (*)    All other components within normal limits  URINALYSIS, ROUTINE W REFLEX MICROSCOPIC  PROTIME-INR  APTT  LACTIC ACID, PLASMA  TYPE AND SCREEN  PREPARE RBC (CROSSMATCH)   Initial labs notable for Hemoccult positive stool, lactic acid of 11. Patient started to receive fluids empirically after my initial evaluation, and IV team has been called to obtain a second point of access.  Update: Hemoglobin less than 6. Transfusion pending.  6:00 PM I discussed the patient's case w family   Imaging Review Ct Abdomen Pelvis Wo Contrast  09/14/2014   CLINICAL DATA:  Recent Whipple procedure for pancreatic carcinoma in November 2017. New onset hematemesis.  EXAM: CT ABDOMEN AND PELVIS WITHOUT CONTRAST  TECHNIQUE: Multidetector CT imaging of the abdomen and pelvis was performed following the standard protocol without IV contrast.   COMPARISON:  CTA 03/04/2014  FINDINGS: Examination abdomen is limited by lack of IV oral contrast. Recommend difficult n postsurgical patient.  Lower chest: There is focus of bandlike atelectasis at right lung base. No pleural fluid. No pericardial fluid.  Hepatobiliary: No focal hepatic lesion on theis non contrast exam. Liver poorly evaluated. No gross evidence duct dilatation. Gallbladder surgically absent.  Pancreas: There is a pancreatic stent extending from the remaining pancreas into the jejunum through the pancreatic jejunostomy. There is a ovoid focus of high density material measuring 2.5 by 1.3 cm adjacent to the distal aspect of the stent within the small bowel (image 28, series 2). Second high-density collection measuring 3.0 x 1.8 cm on image 32, series 2 several cm distal to the first collection. These could represent focus as of hematoma or hemorrhage. There is no gross evidence of obstruction. There is no intraperitoneal free air.  Spleen: Normal.  Adrenals/urinary tract: Adrenal glands kidneys are normal. Ureters and bladder appear normal.  Stomach/Bowel: Stomach demonstrates postsurgical change consistent with Whipple procedure. There is some high-density material in the gastric fundus measuring 2.2 by 2.0 cm (image 23 from series 2). This could also represent small focus clot. The distal small bowel, appendix, and colon are unremarkable. Small mild fluid along the pericolic gutters.  Vascular/Lymphatic: Abdominal or is normal caliber. No retroperitoneal periportal lymphadenopathy.  Reproductive: Uterus and ovaries are normal.  Musculoskeletal: No aggressive osseous lesion. Degenerate change the spine.  Other: [place] intracranial free air.  Small amount fluid.  IMPRESSION: 1. Two ovoid high-density lesions adjacent to the pancreatic stent within the jejunum could represent hemorrhage or hematoma. 2. No additional gross evidence of surgical complication on this non IV and oral contrast exam. 3.  Potential small additional clot within the stomach is indeterminate. Findings conveyed toROBERT Sarthak Rubenstein on 09/14/2014  at19:26.  Electronically Signed   By: Suzy Bouchard M.D.   On: 09/14/2014 19:27  I discussed the findings with out radiologist   EKG Interpretation   Date/Time:  Friday September 14 2014 16:49:17 EST Ventricular Rate:  95 PR Interval:  134 QRS Duration: 90 QT Interval:  395 QTC Calculation: 497 R Axis:   84 Text Interpretation:  Sinus rhythm Borderline right axis deviation  Consider left ventricular hypertrophy Borderline prolonged QT interval  Sinus rhythm Left ventricular hypertrophy Abnormal ekg Confirmed by  Carmin Muskrat  MD [phone]) on 09/14/2014 5:09:49 PM     I reviewed the patient's records, including her recent surgical procedure notes, as well as her oncology notes.  Following 2 L of fluid patient made hypotensive. Labs also suggest hepatorenal syndrome. I discussed patient's case with our critical care team. Advised to continue fluid and blood resuscitation.   8:37 PM BP slightly improved, and patient appears better.  9:23 PM I discussed patient's case with our gastroenterology colleagues.  They will follow as a consulting service. On repeat exam the patient is receiving her blood transfusion, blood pressure is slightly better, 82/66, but mentation has improved notably.    MDM   Final diagnoses:  Acute GI bleeding   acute kidney injury Lactic acidosis Psychosis Hypotension   Patient presents with hematemesis, melena, is found to be hypotensive, listless. Patient's labs are notable for lactic acidosis, leukocytosis, anemia, and evidence for both renal and hepatic dysfunction. Patient was critically ill, clearly from her initial presentation. Resuscitation included multiple liters of IV fluids, blood transfusion supplement oxygen. Patient's blood pressure did improve marginally, but cognition improved notably. I discussed patient's  case with our critical care team, as well as gastroenterology. Patient required admission to the ICU for further evaluation, management of her acute GI bleed.   CRITICAL CARE Performed by: Carmin Muskrat Total critical care time: 50 Critical care time was exclusive of separately billable procedures and treating other patients. Critical care was necessary to treat or prevent imminent or life-threatening deterioration. Critical care was time spent personally by me on the following activities: development of treatment plan with patient and/or surrogate as well as nursing, discussions with consultants, evaluation of patient's response to treatment, examination of patient, obtaining history from patient or surrogate, ordering and performing treatments and interventions, ordering and review of laboratory studies, ordering and review of radiographic studies, pulse oximetry and re-evaluation of patient's condition.    Carmin Muskrat, MD 09/15/14 [phone]

## 2014-09-14 NOTE — H&P (Signed)
PULMONARY / CRITICAL CARE MEDICINE   Name: Melinda Conrad MRN: [id number] DOB: [birth year]    ADMISSION DATE:  09/14/2014  INITIAL PRESENTATION:  Admitted to PCCM service via ED with melena X several days, isolated fever on evening prior to admission (to 104.0 and trated with ibuprofen), episode of hematemesis on evening of presentation to ED for which EMS was dispatched. In [facility] ED, pt hypotensive and lethargic - both improved with IVFs and PRBCs. Recent diagnosis of pancreatic cancer, s/p Whpple procedure 08/07/14. Pt indicates that she uses ibuprofen fairly regularly.   SIGNIFICANT EVENTS/STUDIES:  12/25 CT abd/pelvis: Two ovoid high-density lesions adjacent to the pancreatic stent within the jejunum could represent hemorrhage or hematoma. No additional gross evidence of surgical complication on this non IV and oral contrast exam. Potential small additional clot within the stomach is indeterminate 12/25 Admitted with UGIB to ICU. 2 units PRBCs ordered   INDWELLING DEVICES:: R IJ CVL 12/25 >>   MICRO DATA: Blood 12/25 >>   ANTIMICROBIALS:  Ceftriaxone 12/25 >>    HISTORY OF PRESENT ILLNESS:   As above  PAST MEDICAL HISTORY :   has a past medical history of Arthritis; Depression; H/O: substance abuse; Back pain; Personal history of colonic polyp - adenoma (10/26/2013); Pancreatitis, acute; Hypertension; Osteoporosis; Radiation (04/30/14-06/08/14); and Cancer.  has past surgical history that includes Lumbar epidural injection; Colonoscopy; ERCP (N/A, 03/06/2014); EUS (N/A, 04/05/2014); and Whipple procedure (N/A, 08/07/2014). Prior to Admission medications   Medication Sig Start Date End Date Taking? Authorizing Provider  amLODipine (NORVASC) 5 MG tablet Take 5 mg by mouth daily.   Yes Historical Provider, MD  bisacodyl (DULCOLAX) 10 MG suppository Place 1 suppository (10 mg total) rectally every three (3) days as needed (no BM). 08/21/14  Yes Stark Klein, MD  CALCIUM-VITAMIN D PO Take 1  capsule by mouth daily.   Yes Historical Provider, MD  ENSURE (ENSURE) Take 237 mLs by mouth 3 (three) times daily between meals.   Yes Historical Provider, MD  ibuprofen (ADVIL,MOTRIN) 800 MG tablet Take 800 mg by mouth 2 (two) times daily as needed for mild pain or moderate pain (pain).    Yes Historical Provider, MD  omeprazole (PRILOSEC) 20 MG capsule Take 40 mg by mouth daily as needed (heartburn).    Yes Historical Provider, MD  oxyCODONE (ROXICODONE) 5 MG immediate release tablet Take 1-2 tablets by mouth every 6 hours as needed for pain 09/10/14  Yes Ladell Pier, MD  sertraline (ZOLOFT) 100 MG tablet Take 100 mg by mouth every morning.    Yes Historical Provider, MD  alum & mag hydroxide-simeth (MAALOX/MYLANTA) 200-200-20 MG/5ML suspension Take 30 mLs by mouth every 6 (six) hours as needed for indigestion or heartburn. Patient not taking: Reported on 09/14/2014 08/21/14   Stark Klein, MD  metoCLOPramide (REGLAN) 10 MG tablet Take 1 tablet (10 mg total) by mouth every 6 (six) hours. Patient not taking: Reported on 09/14/2014 03/18/14   Mariea Clonts, MD  psyllium (REGULOID) 0.52 G capsule Take 1 capsule (0.52 g total) by mouth daily. Patient not taking: Reported on 09/14/2014 05/23/14   Ladell Pier, MD  sorbitol 70 % solution Take 15 mLs by mouth 2 (two) times daily. Increase to 30 ml twice a day for constipation Patient not taking: Reported on 09/14/2014 05/23/14   Ladell Pier, MD   No Known Allergies  FAMILY HISTORY:  indicated that her mother is deceased. She indicated that her father is deceased.  SOCIAL HISTORY:  reports that she has been smoking Cigarettes.  She has a 10 pack-year smoking history. She uses smokeless tobacco. She reports that she does not drink alcohol or use illicit drugs.  REVIEW OF SYSTEMS:  50 lb wt los over 7-8 months  SUBJECTIVE:   VITAL SIGNS: Temp:  [97.6 F (36.4 C)-98.9 F (37.2 C)] 97.6 F (36.4 C) (12/25 2139) Pulse Rate:  [72-98] 95  (12/25 2215) Resp:  [20-32] 29 (12/25 2215) BP: (63-91)/(41-66) 91/58 mmHg (12/25 2215) SpO2:  [78 %-100 %] 100 % (12/25 2215) HEMODYNAMICS:   VENTILATOR SETTINGS:   INTAKE / OUTPUT:  Intake/Output Summary (Last 24 hours) at 09/14/14 2309 Last data filed at 09/14/14 2019  Gross per 24 hour  Intake   1335 ml  Output      0 ml  Net   1335 ml    PHYSICAL EXAMINATION: General:  Cachectic, frail pleasant, no respiratory distress Neuro: RASS 0, + F/C, cognition intact, MAEs HEENT: temporal wasting, otherwise WNL Cardiovascular: mildly tachy, few extrasystoles, no M Lungs: no adventitious sounds Abdomen: scaphoid, soft, NT, +BS EXT: muscle wasting, cool, no edema, 1+ pulses  LABS:  CBC  Recent Labs Lab 09/14/14 1721  WBC 20.8*  HGB 5.8*  HCT 17.7*  PLT 76*   Coag's No results for input(s): APTT, INR in the last 168 hours. BMET  Recent Labs Lab 09/14/14 1721  NA 139  K 2.6*  CL 107  CO2 13*  BUN 28*  CREATININE 1.18*  GLUCOSE 69*   Electrolytes  Recent Labs Lab 09/14/14 1721  CALCIUM 7.6*   Sepsis Markers  Recent Labs Lab 09/14/14 1731  LATICACIDVEN 11.22*   ABG No results for input(s): PHART, PCO2ART, PO2ART in the last 168 hours. Liver Enzymes  Recent Labs Lab 09/14/14 1721  AST 89*  ALT <5  ALKPHOS 401*  BILITOT 1.5*  ALBUMIN 1.8*   Cardiac Enzymes No results for input(s): TROPONINI, PROBNP in the last 168 hours. Glucose No results for input(s): GLUCAP in the last 168 hours.  CXR:   ASSESSMENT / PLAN:  PULMONARY A: No issues P:   Monitor  Supp O2 as needed  CARDIOVASCULAR A:  Hemorrhagic shock P:  Volume and blood products to maintain MAP > 60 mmHg  RENAL A:   AKI - likely shock +/- NSAIDS P:   Monitor BMET intermittently Monitor I/Os Correct electrolytes as indicated Very poor candidate for HD  GASTROINTESTINAL A:   UGIB - likely gastric or duodenal ulcer. R/O varices Pancreatic cancer - recent  Whipple Elevated LFTs - favor obstructive pattern Severe protein-calorie malnutrition P:   Double dose PPI GI consult requested by EDP NPO for now Will notify Oncology Benay Spice) as courtesy 12/28  It seems that she has poor insight regarding prognosis  HEMATOLOGIC A:   Acute blood loss anemia Anemia of chronic illness Thrombocytopenia  R/O hepatic coagulopathy P:  DVT px: SCDs Monitor CBC intermittently Transfuse for hemorrhagic shock Follow up coags - consider FFP ad/or vitamin K if indicated   INFECTIOUS A:   Isolated fever P:   Micro and abx as above Ceftriaxone is for SBP prophylaxis until certain not a variceal bleed  ENDOCRINE A:   No issues P:   Monitor glu on chem panels  NEUROLOGIC A:   Post surgical and cancer related pain Very poor functional status P:   RASS goal: 0 PRN fentanyl Palliative Care consultation requested 12/25 for goals of care   40 mins CCM time   Merton Border, MD ;  PCCM service Mobile [phone].  After 5:30 PM or weekends, call [phone] Pulmonary and [place] Pager: [phone]  09/14/2014, 11:09 PM

## 2014-09-15 ENCOUNTER — Encounter (HOSPITAL_COMMUNITY)
Admission: EM | Disposition: A | Discharge status: Home Health | Payer: Self-pay | Source: Home / Self Care | Attending: Family Medicine

## 2014-09-15 DIAGNOSIS — R579 Shock, unspecified: Secondary | ICD-10-CM

## 2014-09-15 DIAGNOSIS — K922 Gastrointestinal hemorrhage, unspecified: Secondary | ICD-10-CM

## 2014-09-15 LAB — BASIC METABOLIC PANEL
Anion gap: 7 (ref 5–15)
BUN: 28 mg/dL — ABNORMAL HIGH (ref 6–23)
CO2: 15 mmol/L — ABNORMAL LOW (ref 19–32)
Calcium: 6.5 mg/dL — ABNORMAL LOW (ref 8.4–10.5)
Chloride: 112 mEq/L (ref 96–112)
Creatinine, Ser: 0.73 mg/dL (ref 0.50–1.10)
Glucose, Bld: 162 mg/dL — ABNORMAL HIGH (ref 70–99)
POTASSIUM: 3.6 mmol/L (ref 3.5–5.1)
Sodium: 134 mmol/L — ABNORMAL LOW (ref 135–145)

## 2014-09-15 LAB — CBC
HCT: 25.1 % — ABNORMAL LOW (ref 36.0–46.0)
HCT: 23.5 % — ABNORMAL LOW (ref 36.0–46.0)
Hemoglobin: 8.3 g/dL — ABNORMAL LOW (ref 12.0–15.0)
Hemoglobin: 8 g/dL — ABNORMAL LOW (ref 12.0–15.0)
MCH: 28.9 pg (ref 26.0–34.0)
MCH: 29.1 pg (ref 26.0–34.0)
MCHC: 33.1 g/dL (ref 30.0–36.0)
MCHC: 34 g/dL (ref 30.0–36.0)
MCV: 87.5 fL (ref 78.0–100.0)
MCV: 85.5 fL (ref 78.0–100.0)
PLATELETS: 62 10*3/uL — AB (ref 150–400)
Platelets: 47 10*3/uL — ABNORMAL LOW (ref 150–400)
RBC: 2.87 MIL/uL — ABNORMAL LOW (ref 3.87–5.11)
RBC: 2.75 MIL/uL — AB (ref 3.87–5.11)
RDW: 14.5 % (ref 11.5–15.5)
RDW: 14.7 % (ref 11.5–15.5)
WBC: 14.9 10*3/uL — ABNORMAL HIGH (ref 4.0–10.5)
WBC: 17.4 10*3/uL — ABNORMAL HIGH (ref 4.0–10.5)

## 2014-09-15 LAB — MRSA PCR SCREENING: MRSA BY PCR: NEGATIVE

## 2014-09-15 LAB — APTT: APTT: 36 s (ref 24–37)

## 2014-09-15 LAB — GLUCOSE, CAPILLARY: Glucose-Capillary: 127 mg/dL — ABNORMAL HIGH (ref 70–99)

## 2014-09-15 LAB — PROTIME-INR
INR: 1.52 — ABNORMAL HIGH (ref 0.00–1.49)
Prothrombin Time: 18.5 seconds — ABNORMAL HIGH (ref 11.6–15.2)

## 2014-09-15 LAB — PREPARE RBC (CROSSMATCH)

## 2014-09-15 SURGERY — EGD (ESOPHAGOGASTRODUODENOSCOPY)
Anesthesia: Moderate Sedation

## 2014-09-15 MED ORDER — SODIUM CHLORIDE 0.9 % IV SOLN
Freq: Once | INTRAVENOUS | Status: AC
  Administered 2014-09-15: 500 mL via INTRAVENOUS

## 2014-09-15 MED ORDER — FENTANYL CITRATE 0.05 MG/ML IJ SOLN
12.5000 ug | INTRAMUSCULAR | Status: DC | PRN
  Administered 2014-09-16 – 2014-09-19 (×33): 75 ug via INTRAVENOUS
  Filled 2014-09-15 (×33): qty 2

## 2014-09-15 MED ORDER — SODIUM CHLORIDE 0.9 % IV BOLUS (SEPSIS)
500.0000 mL | Freq: Once | INTRAVENOUS | Status: AC
Start: 2014-09-15 — End: 2014-09-15
  Administered 2014-09-15: 500 mL via INTRAVENOUS

## 2014-09-15 MED ORDER — ACETAMINOPHEN 10 MG/ML IV SOLN
1000.0000 mg | Freq: Once | INTRAVENOUS | Status: AC
  Administered 2014-09-15: 1000 mg via INTRAVENOUS
  Filled 2014-09-15: qty 100

## 2014-09-15 MED ORDER — PIPERACILLIN-TAZOBACTAM 3.375 G IVPB
3.3750 g | Freq: Three times a day (TID) | INTRAVENOUS | Status: DC
  Administered 2014-09-15 – 2014-09-17 (×5): 3.375 g via INTRAVENOUS
  Filled 2014-09-15 (×5): qty 50

## 2014-09-15 MED ORDER — CETYLPYRIDINIUM CHLORIDE 0.05 % MT LIQD
7.0000 mL | Freq: Two times a day (BID) | OROMUCOSAL | Status: DC
  Administered 2014-09-16 – 2014-09-23 (×12): 7 mL via OROMUCOSAL

## 2014-09-15 MED ORDER — DIPHENHYDRAMINE HCL 50 MG/ML IJ SOLN
INTRAMUSCULAR | Status: AC
  Filled 2014-09-15: qty 1

## 2014-09-15 MED ORDER — NOREPINEPHRINE BITARTRATE 1 MG/ML IV SOLN
2.0000 ug/min | INTRAVENOUS | Status: DC
  Administered 2014-09-15: 5 ug/min via INTRAVENOUS
  Administered 2014-09-15: 10 ug/min via INTRAVENOUS
  Administered 2014-09-16: 6 ug/min via INTRAVENOUS
  Filled 2014-09-15 (×3): qty 4

## 2014-09-15 MED ORDER — SODIUM CHLORIDE 0.9 % IV SOLN
INTRAVENOUS | Status: AC
  Administered 2014-09-16: 07:00:00 via INTRAVENOUS

## 2014-09-15 MED ORDER — CHLORHEXIDINE GLUCONATE 0.12 % MT SOLN
15.0000 mL | Freq: Two times a day (BID) | OROMUCOSAL | Status: DC
  Administered 2014-09-15 – 2014-09-21 (×13): 15 mL via OROMUCOSAL
  Filled 2014-09-15 (×12): qty 15

## 2014-09-15 MED ORDER — DEXTROSE-NACL 5-0.9 % IV SOLN
INTRAVENOUS | Status: DC
  Administered 2014-09-15: 1000 mL via INTRAVENOUS
  Administered 2014-09-16: 04:00:00 via INTRAVENOUS

## 2014-09-15 MED ORDER — MEPERIDINE HCL 25 MG/ML IJ SOLN
12.5000 mg | Freq: Once | INTRAMUSCULAR | Status: AC
  Administered 2014-09-15: 12.5 mg via INTRAVENOUS
  Filled 2014-09-15: qty 1

## 2014-09-15 MED ORDER — FENTANYL CITRATE 0.05 MG/ML IJ SOLN
12.5000 ug | INTRAMUSCULAR | Status: DC | PRN
  Administered 2014-09-15 (×2): 25 ug via INTRAVENOUS
  Filled 2014-09-15: qty 2

## 2014-09-15 MED ORDER — SODIUM CHLORIDE 0.9 % IV BOLUS (SEPSIS)
500.0000 mL | Freq: Once | INTRAVENOUS | Status: AC
  Administered 2014-09-15: 500 mL via INTRAVENOUS

## 2014-09-15 MED ORDER — FENTANYL CITRATE 0.05 MG/ML IJ SOLN
INTRAMUSCULAR | Status: AC
  Filled 2014-09-15: qty 2

## 2014-09-15 MED ORDER — METOPROLOL TARTRATE 1 MG/ML IV SOLN
2.5000 mg | INTRAVENOUS | Status: DC | PRN
  Filled 2014-09-15: qty 5

## 2014-09-15 MED ORDER — SODIUM CHLORIDE 0.9 % IV SOLN: INTRAVENOUS | Status: DC

## 2014-09-15 MED ORDER — MIDAZOLAM HCL 10 MG/2ML IJ SOLN
INTRAMUSCULAR | Status: AC
  Filled 2014-09-15: qty 2

## 2014-09-15 NOTE — Progress Notes (Signed)
Name: Melinda Conrad MRN: [id number] DOB: 10-Sep-1951  ELECTRONIC ICU PHYSICIAN NOTE  Problem:  Anemia/ shock is levophed dep   Intake/Output Summary (Last 24 hours) at 09/15/14 1543 Last data filed at 09/15/14 1500  Gross per 24 hour  Intake 4823.01 ml  Output    790 ml  Net 4033.01 ml     Lab Results  Component Value Date   HGB 8.0* 09/15/2014   HGB 8.3* 09/15/2014   HGB 5.8* 09/14/2014    Lab Results  Component Value Date   PLT 47* 09/15/2014   PLT 62* 09/15/2014   PLT 76* 09/14/2014   PLT 168 06/22/2014   PLT 171 06/08/2014   PLT 169 05/24/2014    Lab Results  Component Value Date   INR 1.52* 09/15/2014   INR 1.13 08/08/2014   INR  08/08/2014    QUESTIONABLE RESULTS, RECOMMEND RECOLLECT TO VERIFY    Active stooling very dark but not frank melena  Intervention:  Transfuse for acute blood loss anemia and then recheck cbc, inr  Christinia Gully 09/15/2014, 3:42 PM

## 2014-09-15 NOTE — Progress Notes (Signed)
PULMONARY / CRITICAL CARE MEDICINE   Name: Melinda Conrad MRN: [id number] DOB: Jun 08, 1951    ADMISSION DATE:  09/14/2014  INITIAL PRESENTATION:  Admitted to PCCM service via ED with melena X several days, isolated fever on evening prior to admission (to 104.0 and trated with ibuprofen), episode of hematemesis on evening of presentation to ED for which EMS was dispatched. In [facility] ED, pt hypotensive and lethargic - both improved with IVFs and PRBCs. Recent diagnosis of pancreatic cancer, s/p Whpple procedure 08/07/14. Pt indicates that she uses ibuprofen fairly regularly.   SIGNIFICANT EVENTS/STUDIES:  12/25 CT abd/pelvis: Two ovoid high-density lesions adjacent to the pancreatic stent within the jejunum could represent hemorrhage or hematoma. No additional gross evidence of surgical complication on this non IV and oral contrast exam. Potential small additional clot within the stomach is indeterminate 12/25 Admitted with UGIB to ICU. 2 units PRBCs ordered   INDWELLING DEVICES:: R IJ CVL 12/25 >>   MICRO DATA: Blood 12/25 >>   ANTIMICROBIALS:  Ceftriaxone 12/25 >>    SUBJECTIVE: c/o abd pain Febrile Soft bP this am  VITAL SIGNS: Temp:  [97.3 F (36.3 C)-101.6 F (38.7 C)] 98.1 F (36.7 C) (12/26 0800) Pulse Rate:  [72-133] 111 (12/26 0900) Resp:  [19-45] 32 (12/26 0900) BP: (63-119)/(23-89) 87/53 mmHg (12/26 0900) SpO2:  [78 %-100 %] 100 % (12/26 0900) Weight:  [50.7 kg (111 lb 12.4 oz)] 50.7 kg (111 lb 12.4 oz) (12/26 0329) HEMODYNAMICS:   VENTILATOR SETTINGS:   INTAKE / OUTPUT:  Intake/Output Summary (Last 24 hours) at 09/15/14 1007 Last data filed at 09/15/14 0800  Gross per 24 hour  Intake 3074.42 ml  Output    650 ml  Net 2424.42 ml    PHYSICAL EXAMINATION: General:  Cachectic, frail pleasant, no respiratory distress Neuro: RASS 0, + F/C, cognition intact, MAEs HEENT: temporal wasting, otherwise WNL Cardiovascular: mildly tachy, few extrasystoles, no  M Lungs: no adventitious sounds Abdomen: scaphoid, soft,mild tenderness peri-umbilical, +BS EXT: muscle wasting, cool, no edema, 1+ pulses  LABS:  CBC  Recent Labs Lab 09/14/14 1721 09/15/14 0322  WBC 20.8* 14.9*  HGB 5.8* 8.3*  HCT 17.7* 25.1*  PLT 76* 62*   Coag's  Recent Labs Lab 09/15/14 0038  APTT 36  INR 1.52*   BMET  Recent Labs Lab 09/14/14 1721 09/15/14 0538  NA 139 134*  K 2.6* 3.6  CL 107 112  CO2 13* 15*  BUN 28* 28*  CREATININE 1.18* 0.73  GLUCOSE 69* 162*   Electrolytes  Recent Labs Lab 09/14/14 1721 09/15/14 0538  CALCIUM 7.6* 6.5*   Sepsis Markers  Recent Labs Lab 09/14/14 1731 09/14/14 2241  LATICACIDVEN 11.22* 3.7*   ABG No results for input(s): PHART, PCO2ART, PO2ART in the last 168 hours. Liver Enzymes  Recent Labs Lab 09/14/14 1721  AST 89*  ALT <5  ALKPHOS 401*  BILITOT 1.5*  ALBUMIN 1.8*   Cardiac Enzymes No results for input(s): TROPONINI, PROBNP in the last 168 hours. Glucose  Recent Labs Lab 09/15/14 0208  GLUCAP 127*    CXR:   ASSESSMENT / PLAN:  PULMONARY A: No issues P:   Monitor  Supp O2 as needed  CARDIOVASCULAR A:  Hemorrhagic shock -lactate cleared P:  Volume and blood products to maintain MAP > 60 mmHg Chk CVP Add levophed gtt- expect Bp to drop post sedation for EGD  RENAL A:   AKI - likely shock +/- NSAIDS -resolved P:   Monitor BMET intermittently Monitor I/Os  Correct electrolytes as indicated   GASTROINTESTINAL A:   UGIB - likely gastric or duodenal ulcer. R/O varices Pancreatic cancer - recent Whipple 08/07/14 Elevated LFTs - favor obstructive pattern Severe protein-calorie malnutrition P:   Double dose PPI GI consult requested by EDP NPO for now Will notify Oncology Benay Spice) as courtesy 12/28  It seems that she has poor insight regarding prognosis  HEMATOLOGIC A:   Acute blood loss anemia Anemia of chronic illness Thrombocytopenia   P:  DVT px:  SCDs Monitor CBC intermittently Transfuse for hemorrhagic shock Transfuse plts if <50   INFECTIOUS A:   Isolated fever P:   Micro and abx as above Ceftriaxone is for SBP prophylaxis until certain not a variceal bleed  ENDOCRINE A:   No issues P:   Monitor glu on chem panels  NEUROLOGIC A:   Post surgical and cancer related pain Very poor functional status P:   RASS goal: 0 PRN fentanyl Palliative Care consultation requested 12/25 for goals of care   The patient is critically ill with multiple organ systems failure and requires high complexity decision making for assessment and support, frequent evaluation and titration of therapies, application of advanced monitoring technologies and extensive interpretation of multiple databases. Critical Care Time devoted to patient care services described in this note independent of APP time is 35 minutes.    Kara Mead MD. Shade Flood. [facility] Pager [phone] If no response call [phone]    09/15/2014, 10:07 AM

## 2014-09-15 NOTE — Progress Notes (Signed)
EGD deferred because of hypotension.  Will schedule for am. If pt continues to c/o abdominal pain recommend KUB.

## 2014-09-15 NOTE — Progress Notes (Signed)
Dr. Melvyn Novas in [place] updated on pt's VS; Hgb 8, and low urine output. Obelia Bonello, Beverly Gust, RN

## 2014-09-15 NOTE — Consult Note (Signed)
GI Consultation  Referring Provider: No ref. provider found*[facility]** Primary Care Physician:  Kevan Ny, MD Primary Gastroenterologist:  Dr. Carlean Purl  Reason for Consultation:  *GI bleed**  HPI: Melinda Conrad is a 63 y.o. female *status post pupil for pancreatic adeno CA approximate one month ago admitted with several days of melena.  Prior to Whipple she underwent preoperative radiation and chemotherapy.  Surgical pathology demonstrated a metastatic lymph node and perineural invasion.  She had an episode of hematemesis on admission.*She had been taking ibuprofen regularly for postoperative abdominal pain.  She currently complains of lower abdominal pain.  On admission*hemoglobin was 5.8 arch Whipple procedure she received   Past Medical History  Diagnosis Date  . Arthritis   . Depression   . H/O: substance abuse   . Back pain   . Personal history of colonic polyp - adenoma 10/26/2013    10/26/2013 diminutive sigmoid polyp removed    . Pancreatitis, acute   . Hypertension   . Osteoporosis   . Radiation 04/30/14-06/08/14    pancreas 50 gray  . Cancer     pancreatic cancer    Past Surgical History  Procedure Laterality Date  . Lumbar epidural injection    . Colonoscopy    . Ercp N/A 03/06/2014    Procedure: ENDOSCOPIC RETROGRADE CHOLANGIOPANCREATOGRAPHY (ERCP);  Surgeon: Gatha Mayer, MD;  Location: [facility];  Service: Endoscopy;  Laterality: N/A;  . Eus N/A 04/05/2014    Procedure: UPPER ENDOSCOPIC ULTRASOUND (EUS) RADIAL;  Surgeon: [name], MD;  Location: [facility] ENDOSCOPY;  Service: Endoscopy;  Laterality: N/A;  . Whipple procedure N/A 08/07/2014    Procedure:  DIAGNOSTIC LAPAROSCOPY, WHIPPLE PROCEDURE;  Surgeon: Stark Klein, MD;  Location: [facility] ORS;  Service: General;  Laterality: N/A;    Prior to Admission medications   Medication Sig Start Date End Date Taking? Authorizing Provider  amLODipine (NORVASC) 5 MG tablet Take 5 mg by mouth daily.   Yes  Historical Provider, MD  bisacodyl (DULCOLAX) 10 MG suppository Place 1 suppository (10 mg total) rectally every three (3) days as needed (no BM). 08/21/14  Yes Stark Klein, MD  CALCIUM-VITAMIN D PO Take 1 capsule by mouth daily.   Yes Historical Provider, MD  ENSURE (ENSURE) Take 237 mLs by mouth 3 (three) times daily between meals.   Yes Historical Provider, MD  ibuprofen (ADVIL,MOTRIN) 800 MG tablet Take 800 mg by mouth 2 (two) times daily as needed for mild pain or moderate pain (pain).    Yes Historical Provider, MD  omeprazole (PRILOSEC) 20 MG capsule Take 40 mg by mouth daily as needed (heartburn).    Yes Historical Provider, MD  oxyCODONE (ROXICODONE) 5 MG immediate release tablet Take 1-2 tablets by mouth every 6 hours as needed for pain 09/10/14  Yes Ladell Pier, MD  sertraline (ZOLOFT) 100 MG tablet Take 100 mg by mouth every morning.    Yes Historical Provider, MD  alum & mag hydroxide-simeth (MAALOX/MYLANTA) 200-200-20 MG/5ML suspension Take 30 mLs by mouth every 6 (six) hours as needed for indigestion or heartburn. Patient not taking: Reported on 09/14/2014 08/21/14   Stark Klein, MD  metoCLOPramide (REGLAN) 10 MG tablet Take 1 tablet (10 mg total) by mouth every 6 (six) hours. Patient not taking: Reported on 09/14/2014 03/18/14   Mariea Clonts, MD  psyllium (REGULOID) 0.52 G capsule Take 1 capsule (0.52 g total) by mouth daily. Patient not taking: Reported on 09/14/2014 05/23/14  Ladell Pier, MD  sorbitol 70 % solution Take 15 mLs by mouth 2 (two) times daily. Increase to 30 ml twice a day for constipation Patient not taking: Reported on 09/14/2014 05/23/14   Ladell Pier, MD    Current Facility-Administered Medications  Medication Dose Route Frequency Provider Last Rate Last Dose  . 0.9 %  sodium chloride infusion   Intravenous STAT Carmin Muskrat, MD      . 0.9 %  sodium chloride infusion  250 mL Intravenous PRN Wilhelmina Mcardle, MD 10 mL/hr at 09/15/14 0600 250 mL at  09/15/14 0600  . cefTRIAXone (ROCEPHIN) 1 g in dextrose 5 % 50 mL IVPB  1 g Intravenous Q24H Wilhelmina Mcardle, MD   1 g at 09/15/14 0103  . dextrose 5 % and 0.45 % NaCl with KCl 40 mEq/L infusion   Intravenous Continuous Wilhelmina Mcardle, MD 75 mL/hr at 09/15/14 0600    . fentaNYL (SUBLIMAZE) injection 12.5-25 mcg  12.5-25 mcg Intravenous Q2H PRN Wilhelmina Mcardle, MD   25 mcg at 09/15/14 0601  . metoprolol (LOPRESSOR) injection 2.5-5 mg  2.5-5 mg Intravenous Q3H PRN Colbert Coyer, MD      . pantoprazole (PROTONIX) injection 40 mg  40 mg Intravenous Q12H Wilhelmina Mcardle, MD   40 mg at 09/15/14 0000    Allergies as of 09/14/2014  . (No Known Allergies)    Family History  Problem Relation Age of Onset  . Colon cancer Neg Hx   . Esophageal cancer Neg Hx   . Rectal cancer Neg Hx   . Stomach cancer Neg Hx   . CAD Mother   . Cancer Father   . Cirrhosis Sister     she is a heavy drinker.     History   Social History  . Marital Status: Legally Separated    Spouse Name: N/A    Number of Children: 3  . Years of Education: N/A   Occupational History  . vocational rehabilitation    Social History Main Topics  . Smoking status: Current Every Day Smoker -- 0.25 packs/day for 40 years    Types: Cigarettes  . Smokeless tobacco: Current User  . Alcohol Use: No     Comment: occasionally. Quit  , none in 3weeks"only drank before  to help with pain before ERD visit"  . Drug Use: No  . Sexual Activity: Yes   Other Topics Concern  . Not on file   Social History Narrative    Review of Systems: Pertinent positive and negative review of systems were noted in the above history of present illness section. All other review of systems were otherwise negative   Physical Exam: Vital signs in last 24 hours: Temp:  [97.3 F (36.3 C)-101.6 F (38.7 C)] 98.1 F (36.7 C) (12/26 0800) Pulse Rate:  [72-133] 109 (12/26 0800) Resp:  [19-45] 26 (12/26 0800) BP: (63-119)/(23-89) 86/54 mmHg  (12/26 0800) SpO2:  [78 %-100 %] 100 % (12/26 0800) Weight:  [111 lb 12.4 oz (50.7 kg)] 111 lb 12.4 oz (50.7 kg) (12/26 0329)  General:   Alert, thin black female in mild distress secondary to lower abdominal discomfort Head:  Normocephalic and atraumatic. Eyes:  Sclera clear, no icterus.   Conjunctiva pink. Ears:  Normal auditory acuity. Nose:  No deformity, discharge,  or lesions. Mouth:  No deformity or lesions.  Oropharynx pink & moist. Neck:  Supple; no masses or thyromegaly. Lungs:  Clear throughout to auscultation.  No wheezes, crackles, or rhonchi. No acute distress. Heart:  Regular rate and rhythm; no murmurs, clicks, rubs,  or gallops. Abdomen:  Soft, l mildly diffusely tender.  Surgical bandage over her mid abdomen.   Rectal:  Deferred until time of colonoscopy.   Msk:  Symmetrical without gross deformities. Normal posture. Pulses:  Normal pulses noted. Extremities:  Without clubbing or edema. Neurologic:  Alert and  oriented x4;  grossly normal neurologically. Skin:  Intact without significant lesions or rashes. Cervical Nodes:  No significant cervical adenopathy. Psych:  Alert and cooperative. Normal mood and affect.  Intake/Output from previous day: 12/25 0701 - 12/26 0700 In: 2989.4 [I.V.:1569.4; Blood:670; IV Piggyback:750] Out: 650 [Urine:650] Intake/Output this shift: Total I/O In: 85 [I.V.:85] Out: -   Lab Results:  Recent Labs  09/14/14 1721 09/15/14 0322  WBC 20.8* 14.9*  HGB 5.8* 8.3*  HCT 17.7* 25.1*  PLT 76* 62*   BMET  Recent Labs  09/14/14 1721 09/15/14 0538  NA 139 134*  K 2.6* 3.6  CL 107 112  CO2 13* 15*  GLUCOSE 69* 162*  BUN 28* 28*  CREATININE 1.18* 0.73  CALCIUM 7.6* 6.5*   LFT  Recent Labs  09/14/14 1721  PROT 6.3  ALBUMIN 1.8*  AST 89*  ALT <5  ALKPHOS 401*  BILITOT 1.5*   PT/INR  Recent Labs  09/15/14 0038  LABPROT 18.5*  INR 1.52*   Hepatitis Panel No results for input(s): HEPBSAG, HCVAB, HEPAIGM,  HEPBIGM in the last 72 hours. Additional Labs **  Studies/Results: Ct Abdomen Pelvis Wo Contrast  09/14/2014   CLINICAL DATA:  Recent Whipple procedure for pancreatic carcinoma in November 2017. New onset hematemesis.  EXAM: CT ABDOMEN AND PELVIS WITHOUT CONTRAST  TECHNIQUE: Multidetector CT imaging of the abdomen and pelvis was performed following the standard protocol without IV contrast.  COMPARISON:  CTA 03/04/2014  FINDINGS: Examination abdomen is limited by lack of IV oral contrast. Recommend difficult n postsurgical patient.  Lower chest: There is focus of bandlike atelectasis at right lung base. No pleural fluid. No pericardial fluid.  Hepatobiliary: No focal hepatic lesion on theis non contrast exam. Liver poorly evaluated. No gross evidence duct dilatation. Gallbladder surgically absent.  Pancreas: There is a pancreatic stent extending from the remaining pancreas into the jejunum through the pancreatic jejunostomy. There is a ovoid focus of high density material measuring 2.5 by 1.3 cm adjacent to the distal aspect of the stent within the small bowel (image 28, series 2). Second high-density collection measuring 3.0 x 1.8 cm on image 32, series 2 several cm distal to the first collection. These could represent focus as of hematoma or hemorrhage. There is no gross evidence of obstruction. There is no intraperitoneal free air.  Spleen: Normal.  Adrenals/urinary tract: Adrenal glands kidneys are normal. Ureters and bladder appear normal.  Stomach/Bowel: Stomach demonstrates postsurgical change consistent with Whipple procedure. There is some high-density material in the gastric fundus measuring 2.2 by 2.0 cm (image 23 from series 2). This could also represent small focus clot. The distal small bowel, appendix, and colon are unremarkable. Small mild fluid along the pericolic gutters.  Vascular/Lymphatic: Abdominal or is normal caliber. No retroperitoneal periportal lymphadenopathy.  Reproductive: Uterus  and ovaries are normal.  Musculoskeletal: No aggressive osseous lesion. Degenerate change the spine.  Other: [place] intracranial free air.  Small amount fluid.  IMPRESSION: 1. Two ovoid high-density lesions adjacent to the pancreatic stent within the jejunum could represent hemorrhage or hematoma. 2. No additional  gross evidence of surgical complication on this non IV and oral contrast exam. 3. Potential small additional clot within the stomach is indeterminate. Findings conveyed toROBERT LOCKWOOD on 09/14/2014  at19:26.   Electronically Signed   By: Suzy Bouchard M.D.   On: 09/14/2014 19:27   Dg Chest Port 1 View  09/14/2014   CLINICAL DATA:  Central line placement.  Initial encounter.  EXAM: PORTABLE CHEST - 1 VIEW  COMPARISON:  Chest radiograph performed 08/10/2014  FINDINGS: The patient's right IJ line is noted ending about the mid SVC.  The lungs are well-aerated. Mild vascular congestion is noted. Increased interstitial markings may reflect minimal interstitial edema. There is no evidence of pleural effusion or pneumothorax.  The cardiomediastinal silhouette is borderline normal in size. No acute osseous abnormalities are seen.  IMPRESSION: 1. Right IJ line noted ending about the mid SVC. 2. Mild vascular congestion noted. Increased interstitial markings may reflect minimal interstitial edema.   Electronically Signed   By: Garald Balding M.D.   On: 09/14/2014 22:58    Active Problems:   Acute GI bleeding   Hemorrhagic shock     IMPRESSION:   *1.  Acute GI bleed in setting of chronic NSAID use.  Living may be due to active peptic ulcer disease related to NSAIDs.  Doubt tumor invasion in view of recent pancreatic resection. 2.  Pancreatic CA-status post Whipple*with perineural invasion and one positive lymph node*    PLAN:     *1.  IV Protonix drip 2.  EGD todayHerbie Baltimore D. Deatra Ina, MD, [facility] [phone]    09/15/2014, 9:08 AM

## 2014-09-15 NOTE — Progress Notes (Signed)
Call received from [facility] Lab reporting 2 (+) blood cx showing gm - rods.  Reported to Dr. Melvyn Novas in e-link. Mihika Surrette, Beverly Gust, RN

## 2014-09-15 NOTE — Progress Notes (Signed)
Pt Hemodynamically unstable...MAP running consistently at 44-49. Dr. Deatra Ina made aware pt to unstable to do procedure.  ELink MD called for pt support orders.Marland KitchenMarland KitchenICU nurse made aware of pt status/unstable.  Case cancelled

## 2014-09-15 NOTE — Progress Notes (Signed)
eLink Physician-Brief Progress Note Patient Name: Melinda Conrad DOB: November 05, 1950 MRN: [id number]   Date of Service  09/15/2014  HPI/Events of Note  Needs more freq fentanyl dosing  eICU Interventions  Try 25 mcg q 1h prn and if can't reduce freqency either a drip or a duragesic path might be needed     Intervention Category Minor Interventions: Routine modifications to care plan (e.g. PRN medications for pain, fever)  Christinia Gully 09/15/2014, 10:09 PM

## 2014-09-15 NOTE — Progress Notes (Signed)
ANTIBIOTIC CONSULT NOTE - INITIAL  Pharmacy Consult for Zosyn Indication: sepsis  No Known Allergies  Patient Measurements: Height: 5\' 3"  (160 cm) Weight: 111 lb 12.4 oz (50.7 kg) IBW/kg (Calculated) : 52.4   Vital Signs: Temp: 98.1 F (36.7 C) (12/26 1705) Temp Source: Oral (12/26 1705) BP: 139/74 mmHg (12/26 1705) Pulse Rate: 108 (12/26 1705) Intake/Output from previous day: 12/25 0701 - 12/26 0700 In: 2989.4 [I.V.:1569.4; Blood:670; IV Piggyback:750] Out: 650 [Urine:650] Intake/Output from this shift: Total I/O In: 2136.2 [I.V.:1606.2; Blood:30; IV Piggyback:500] Out: 160 [Urine:160]  Labs:  Recent Labs  09/14/14 1721 09/15/14 0322 09/15/14 0538 09/15/14 1200  WBC 20.8* 14.9*  --  17.4*  HGB 5.8* 8.3*  --  8.0*  PLT 76* 62*  --  47*  CREATININE 1.18*  --  0.73  --    Estimated Creatinine Clearance: 59.1 mL/min (by C-G formula based on Cr of 0.73). No results for input(s): VANCOTROUGH, VANCOPEAK, VANCORANDOM, GENTTROUGH, GENTPEAK, GENTRANDOM, TOBRATROUGH, TOBRAPEAK, TOBRARND, AMIKACINPEAK, AMIKACINTROU, AMIKACIN in the last 72 hours.   Microbiology: Recent Results (from the past 720 hour(s))  Culture, blood (routine x 2)     Status: None (Preliminary result)   Collection Time: 09/15/14 12:10 AM  Result Value Ref Range Status   Specimen Description BLOOD R ARM  Final   Special Requests BOTTLES DRAWN AEROBIC ONLY 3CC  Final   Culture   Final    GRAM NEGATIVE RODS Note: Gram Stain Report Called to,Read Back By and Verified With: STEPHANIE DILLON 09/15/14 @ 5:03PM BY RUSCOE A. Performed at [facility]    Report Status PENDING  Incomplete  MRSA PCR Screening     Status: None   Collection Time: 09/15/14 12:27 AM  Result Value Ref Range Status   MRSA by PCR NEGATIVE NEGATIVE Final    Comment:        The GeneXpert MRSA Assay (FDA approved for NASAL specimens only), is one component of a comprehensive MRSA colonization surveillance program. It is  not intended to diagnose MRSA infection nor to guide or monitor treatment for MRSA infections.   Culture, blood (routine x 2)     Status: None (Preliminary result)   Collection Time: 09/15/14 12:38 AM  Result Value Ref Range Status   Specimen Description BLOOD R IS CVC  Final   Special Requests BOTTLES DRAWN AEROBIC AND ANAEROBIC 5CC EACH  Final   Culture   Final    GRAM NEGATIVE RODS Note: Gram Stain Report Called to,Read Back By and Verified With: STEPHANIE DILLON 09/15/14 @ 5:03PM BY RUSCOE A Performed at [facility]    Report Status PENDING  Incomplete    Medical History: Past Medical History  Diagnosis Date  . Arthritis   . Depression   . H/O: substance abuse   . Back pain   . Personal history of colonic polyp - adenoma 10/26/2013    10/26/2013 diminutive sigmoid polyp removed    . Pancreatitis, acute   . Hypertension   . Osteoporosis   . Radiation 04/30/14-06/08/14    pancreas 50 gray  . Cancer     pancreatic cancer    Medications:  Scheduled:  . sodium chloride   Intravenous STAT  . pantoprazole (PROTONIX) IV  40 mg Intravenous Q12H   Assessment: 63 y.o. Female with recent diagnosis of pancreatic cancer, s/p Whpple procedure 08/07/14. Pt presents to [facility] ED hypotensive and lethargic, pharmacy consulted to dose zosyn for sepsis.  Goal of Therapy:  Zosyn per renal function  Plan:   Zosyn 3.375mg  IV q8h EI  Follow renal function, cultures, clinical course  Dolly Rias RPh 09/15/2014, 5:40 PM Pager [phone]

## 2014-09-15 NOTE — Progress Notes (Addendum)
   09/15/14 0404  Vitals  Temp (!) 101.6 F (38.7 C)  Temp Source Rectal  BP 117/89 mmHg  MAP (mmHg) 99  Pulse Rate (!) 133  ECG Heart Rate (!) 134  Resp (!) 45  Oxygen Therapy  SpO2 97 %  O2 Device Nasal Cannula  O2 Flow Rate (L/min) 2 L/min   Patient having an episode of shivering and abdominal pain with a rating of 9/10. Pt. tachypneic and tachycardiac (see vitals signs above). Pt alert/oriented and denies any shortness of breath. Dr. Jimmy Footman informed, order written for IV Tylenol and Demerol. Will continue to monitor.  0240. Pt resting comfortably after medication.

## 2014-09-16 ENCOUNTER — Inpatient Hospital Stay (HOSPITAL_COMMUNITY): Payer: Medicaid Other

## 2014-09-16 ENCOUNTER — Encounter (HOSPITAL_COMMUNITY): Payer: Self-pay | Admitting: Radiology

## 2014-09-16 ENCOUNTER — Encounter (HOSPITAL_COMMUNITY)
Admission: EM | Disposition: A | Discharge status: Home Health | Payer: Self-pay | Source: Home / Self Care | Attending: Family Medicine

## 2014-09-16 DIAGNOSIS — R6521 Severe sepsis with septic shock: Secondary | ICD-10-CM

## 2014-09-16 DIAGNOSIS — D696 Thrombocytopenia, unspecified: Secondary | ICD-10-CM

## 2014-09-16 DIAGNOSIS — D62 Acute posthemorrhagic anemia: Secondary | ICD-10-CM

## 2014-09-16 DIAGNOSIS — A419 Sepsis, unspecified organism: Secondary | ICD-10-CM

## 2014-09-16 HISTORY — DX: Thrombocytopenia, unspecified: D69.6

## 2014-09-16 LAB — BASIC METABOLIC PANEL
Anion gap: 3 — ABNORMAL LOW (ref 5–15)
BUN: 34 mg/dL — AB (ref 6–23)
CO2: 17 mmol/L — AB (ref 19–32)
Calcium: 7.2 mg/dL — ABNORMAL LOW (ref 8.4–10.5)
Chloride: 118 mEq/L — ABNORMAL HIGH (ref 96–112)
Creatinine, Ser: 0.96 mg/dL (ref 0.50–1.10)
GFR calc Af Amer: 72 mL/min — ABNORMAL LOW (ref >90)
GFR, EST NON AFRICAN AMERICAN: 63 mL/min — AB (ref >90)
GLUCOSE: 163 mg/dL — AB (ref 70–99)
POTASSIUM: 2.9 mmol/L — AB (ref 3.5–5.1)
Sodium: 138 mmol/L (ref 135–145)

## 2014-09-16 LAB — CBC
HCT: 33 % — ABNORMAL LOW (ref 36.0–46.0)
HCT: 35.8 % — ABNORMAL LOW (ref 36.0–46.0)
HCT: 35.1 % — ABNORMAL LOW (ref 36.0–46.0)
HEMATOCRIT: 33 % — AB (ref 36.0–46.0)
HEMOGLOBIN: 11.3 g/dL — AB (ref 12.0–15.0)
HEMOGLOBIN: 11.5 g/dL — AB (ref 12.0–15.0)
HEMOGLOBIN: 12.2 g/dL (ref 12.0–15.0)
HEMOGLOBIN: 12.2 g/dL (ref 12.0–15.0)
MCH: 29.1 pg (ref 26.0–34.0)
MCH: 29.6 pg (ref 26.0–34.0)
MCH: 29 pg (ref 26.0–34.0)
MCH: 29.3 pg (ref 26.0–34.0)
MCHC: 34.2 g/dL (ref 30.0–36.0)
MCHC: 34.8 g/dL (ref 30.0–36.0)
MCHC: 34.1 g/dL (ref 30.0–36.0)
MCHC: 34.8 g/dL (ref 30.0–36.0)
MCV: 85.1 fL (ref 78.0–100.0)
MCV: 84.8 fL (ref 78.0–100.0)
MCV: 85 fL (ref 78.0–100.0)
MCV: 84.2 fL (ref 78.0–100.0)
PLATELETS: 26 10*3/uL — AB (ref 150–400)
Platelets: 34 10*3/uL — ABNORMAL LOW (ref 150–400)
Platelets: 31 10*3/uL — ABNORMAL LOW (ref 150–400)
Platelets: 24 10*3/uL — CL (ref 150–400)
RBC: 3.88 MIL/uL (ref 3.87–5.11)
RBC: 3.89 MIL/uL (ref 3.87–5.11)
RBC: 4.21 MIL/uL (ref 3.87–5.11)
RBC: 4.17 MIL/uL (ref 3.87–5.11)
RDW: 14.5 % (ref 11.5–15.5)
RDW: 14.6 % (ref 11.5–15.5)
RDW: 14.8 % (ref 11.5–15.5)
RDW: 14.8 % (ref 11.5–15.5)
WBC: 22.5 10*3/uL — AB (ref 4.0–10.5)
WBC: 20.8 10*3/uL — ABNORMAL HIGH (ref 4.0–10.5)
WBC: 13.6 10*3/uL — AB (ref 4.0–10.5)
WBC: 13 10*3/uL — AB (ref 4.0–10.5)

## 2014-09-16 LAB — TYPE AND SCREEN
ABO/RH(D): O POS
ANTIBODY SCREEN: NEGATIVE
UNIT DIVISION: 0
UNIT DIVISION: 0
Unit division: 0
Unit division: 0

## 2014-09-16 LAB — MAGNESIUM
MAGNESIUM: 2.6 mg/dL — AB (ref 1.5–2.5)
Magnesium: 1.3 mg/dL — ABNORMAL LOW (ref 1.5–2.5)

## 2014-09-16 LAB — PHOSPHORUS: PHOSPHORUS: 1.5 mg/dL — AB (ref 2.3–4.6)

## 2014-09-16 SURGERY — EGD (ESOPHAGOGASTRODUODENOSCOPY)
Anesthesia: Moderate Sedation

## 2014-09-16 MED ORDER — IOHEXOL 300 MG/ML  SOLN
100.0000 mL | Freq: Once | INTRAMUSCULAR | Status: AC | PRN
  Administered 2014-09-16: 100 mL via INTRAVENOUS

## 2014-09-16 MED ORDER — ONDANSETRON 8 MG/NS 50 ML IVPB: 8.0000 mg | Freq: Four times a day (QID) | INTRAVENOUS | Status: DC | PRN

## 2014-09-16 MED ORDER — POTASSIUM PHOSPHATES 15 MMOLE/5ML IV SOLN
30.0000 mmol | Freq: Once | INTRAVENOUS | Status: AC
  Administered 2014-09-16: 30 mmol via INTRAVENOUS
  Filled 2014-09-16: qty 10

## 2014-09-16 MED ORDER — IOHEXOL 300 MG/ML  SOLN
50.0000 mL | Freq: Once | INTRAMUSCULAR | Status: AC | PRN
  Administered 2014-09-16: 50 mL via ORAL

## 2014-09-16 MED ORDER — SODIUM CHLORIDE 0.9 % IV SOLN
6.0000 g | Freq: Once | INTRAVENOUS | Status: AC
  Administered 2014-09-16: 6 g via INTRAVENOUS
  Filled 2014-09-16: qty 12

## 2014-09-16 MED ORDER — ACETAMINOPHEN 650 MG RE SUPP: 650.0000 mg | Freq: Four times a day (QID) | RECTAL | Status: DC | PRN

## 2014-09-16 MED ORDER — POTASSIUM CHLORIDE 10 MEQ/50ML IV SOLN
10.0000 meq | INTRAVENOUS | Status: AC
  Administered 2014-09-16 (×4): 10 meq via INTRAVENOUS
  Filled 2014-09-16: qty 50

## 2014-09-16 MED ORDER — VITAMINS A & D EX OINT
TOPICAL_OINTMENT | CUTANEOUS | Status: AC
  Administered 2014-09-16: 06:00:00
  Filled 2014-09-16: qty 15

## 2014-09-16 MED ORDER — METOCLOPRAMIDE HCL 5 MG/ML IJ SOLN: 5.0000 mg | Freq: Four times a day (QID) | INTRAMUSCULAR | Status: DC | PRN

## 2014-09-16 MED ORDER — HEPARIN (PORCINE) IN NACL 100-0.45 UNIT/ML-% IJ SOLN
500.0000 [IU]/h | INTRAMUSCULAR | Status: DC
  Administered 2014-09-16: 500 [IU]/h via INTRAVENOUS
  Filled 2014-09-16: qty 250

## 2014-09-16 MED ORDER — ONDANSETRON HCL 4 MG/2ML IJ SOLN
4.0000 mg | Freq: Four times a day (QID) | INTRAMUSCULAR | Status: DC | PRN
  Filled 2014-09-16: qty 2

## 2014-09-16 NOTE — Progress Notes (Signed)
eLink Physician-Brief Progress Note Patient Name: Melinda Conrad DOB: 07-30-1951 MRN: [id number]   Date of Service  09/16/2014  HPI/Events of Note  Discussed with Rulon Sera Risk infarction if no anticoagulation Risk bleeding esp with plt 24   eICU Interventions  Start hep drip, no bolus, recheck hgb  plt in 6 h     Intervention Category Major Interventions: Other:  Christinia Gully 09/16/2014, 7:12 PM

## 2014-09-16 NOTE — Progress Notes (Signed)
Melinda  Conrad., [place], [place] [address] Phone: [phone] FAX: [phone]    Melinda Conrad [id number] 22-Feb-1951  CARE TEAM:  PCP: Kevan Ny, MD  Outpatient Care Team: Patient Care Team: Rogers Blocker, MD as PCP - General (Internal Medicine) [name], MD as Attending Physician (Gastroenterology) Karie Mainland, RD as Dietitian (Nutrition)  Inpatient Treatment Team: Treatment Team: Attending Provider: Wilhelmina Mcardle, MD; Rounding Team: Md Pccm, MD; Consulting Physician: Inda Castle, MD; Consulting Physician: Palliative Alla Feeling; Registered Nurse: Tamsen Roers, RN; Consulting Physician: Stark Klein, MD; Consulting Physician: Nolon Nations, MD   Subjective:  Called by gastroenterology.  Patient status post Whipple pancreatic duodenectomy for pancreatic cancer.  Eventually recovered went home.  Readmitted 2 days ago with shock.  Apparently been taking ibuprofen regularly.  Found to have Hgb 5.8.  Admitted to intensive care unit.  Transfuse.  Placed on pressors.  Shock markedly improved now.  Patient with persistent left upper abdominal pain.  More intense than usual postoperative pain.  CT scan repeated with better contrast today.  Confirms clot in portal vein confluence.  Not completely obstructing but concern for at least partial significant portal vein thrombosis.  I was called for advice.  ICU nurse in room.  Objective:  Vital signs:  Filed Vitals:   09/16/14 1600 09/16/14 1700 09/16/14 1800 09/16/14 1834  BP: 110/73 1'11/66 87/54 95/60 '  Pulse: 96 94 90 85  Temp:      TempSrc:      Resp: 35 43 29 16  Height:      Weight:      SpO2: 92% 92% 92% 92%    Last BM Date: 09/15/14  Intake/Output   Yesterday:  12/26 0701 - 12/27 0700 In: 4699.7 [I.V.:3144.7; Blood:845; IV Piggyback:710] Out: 54 [Urine:580] This shift:  Total I/O In: 1754.8 [I.V.:1164.8; IV Piggyback:590] Out: 1100  [Urine:1100]  Bowel function:  Flatus: yes  BM: yes  Drain: n/o  Physical Exam:  General: Pt awake/alert/oriented x4 in mild acute distress Eyes: PERRL, normal EOM.  Sclera clear.  No icterus Neuro: CN II-XII intact w/o focal sensory/motor deficits. Lymph: No head/neck/groin lymphadenopathy Psych:  No delerium/psychosis/paranoia HENT: Normocephalic, Mucus membranes moist.  No thrush Neck: Supple, No tracheal deviation Chest: No chest wall pain w good excursion CV:  Pulses intact.  Regular rhythm MS: Normal AROM mjr joints.  No obvious deformity Abdomen: Soft.  Mildly distended.  Mildly tender LUQ > epigastric.  Midline wound mostly closed.  4 x 2 x 2 cm area with mild granulation.  No drainage or abscess.  No evidence of peritonitis.  No incarcerated hernias. Ext:  SCDs BLE.  No mjr edema.  No cyanosis Skin: No petechiae / purpura   Problem List:   Principal Problem:   Acute GI bleeding Active Problems:   Depression   Protein-calorie malnutrition, severe   Hemorrhagic shock   Septic shock   Thrombocytopenia   Assessment  Melinda Conrad  63 y.o. 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  Difficult situation  Plan:  Discussed with  Dr. Deatra Ina on consult with gastroenterology.  Standard of care would be aggressive and at least anticoagulate if not thrombolyze the thrombosis.  Because of the recent bleeding event and the thrombosis not complete, reasonable to start with anticoagulation  first.  Agree with heparin drip so that this can be followed closely.  Obviously risk of bleeding or higher with recent surgery and GI bleed.  However progression of thrombosis could have significant problems including bowel ischemia and death.  Dr. Deatra Ina considering endoscopy to rule out bleeding source first before more aggressive anticoagulation.  I think that seems reasonable.  Another option is to begin anticoagulation and do endoscopy only if she bleeds again.   Now the Dr. Carlean Purl is going on, seems plan is to be more expectant and not rush to endoscopy.  Discussed with Dr. Melvyn Novas with intensive care.  Challenge is heparin in a patient with thrombocytopenia not a good option.  Other IV alternatives have longer half-life (though better than heparin/warfarin/plavix, etc...)  so therefore makes harder to control if bleeding occurs.  My concern is bleeding can be treated with transfusions vs progressive PVT leading to bowel necrosis is far more ominous of a complication.  CCM Dr Melvyn Novas will discuss with hematology to see what options are available.    Adin Hector, M.D., F.A.C.S. Gastrointestinal and Minimally Invasive Surgery [facility] [address], [place], [place] [address] [phone] Main / Paging   09/16/2014   Results:   Labs: Results for orders placed or performed during the hospital encounter of 09/14/14 (from the past 48 hour(s))  Lactic acid, plasma     Status: Abnormal   Collection Time: 09/14/14 10:41 PM  Result Value Ref Range   Lactic Acid, Venous 3.7 (H) 0.5 - 2.2 mmol/L  Culture, blood (routine x 2)     Status: None (Preliminary result)   Collection Time: 09/15/14 12:10 AM  Result Value Ref Range   Specimen Description BLOOD R ARM    Special Requests BOTTLES DRAWN AEROBIC ONLY 3CC    Culture      GRAM NEGATIVE RODS Note: Gram Stain Report Called to,Read Back By and Verified With: STEPHANIE DILLON 09/15/14 @ 5:03PM BY RUSCOE  A. Performed at [facility]    Report Status PENDING   MRSA PCR Screening     Status: None   Collection Time: 09/15/14 12:27 AM  Result Value Ref Range   MRSA by PCR NEGATIVE NEGATIVE    Comment:        The GeneXpert MRSA Assay (FDA approved for NASAL specimens only), is one component of a comprehensive MRSA colonization surveillance program. It is not intended to diagnose MRSA infection nor to guide or monitor treatment for MRSA infections.   Protime-INR     Status: Abnormal   Collection Time: 09/15/14 12:38 AM  Result Value Ref Range   Prothrombin Time 18.5 (H) 11.6 - 15.2 seconds   INR 1.52 (H) 0.00 - 1.49  APTT     Status: None   Collection Time: 09/15/14 12:38 AM  Result Value Ref Range   aPTT 36 24 - 37 seconds  Culture, blood (routine x 2)     Status: None (Preliminary result)   Collection Time: 09/15/14 12:38 AM  Result Value Ref Range   Specimen Description BLOOD R IS CVC    Special Requests BOTTLES DRAWN AEROBIC AND ANAEROBIC 5CC EACH    Culture      GRAM NEGATIVE RODS Note: Gram Stain  Report Called to,Read Back By and Verified With: STEPHANIE DILLON 09/15/14 @ 5:03PM BY RUSCOE A Performed at [facility]    Report Status PENDING   Glucose, capillary     Status: Abnormal   Collection Time: 09/15/14  2:08 AM  Result Value Ref Range   Glucose-Capillary 127 (H) 70 - 99 mg/dL  CBC     Status: Abnormal   Collection Time: 09/15/14  3:22 AM  Result Value Ref Range   WBC 14.9 (H) 4.0 - 10.5 K/uL   RBC 2.87 (L) 3.87 - 5.11 MIL/uL   Hemoglobin 8.3 (L) 12.0 - 15.0 g/dL    Comment: DELTA CHECK NOTED POST TRANSFUSION SPECIMEN    HCT 25.1 (L) 36.0 - 46.0 %   MCV 87.5 78.0 - 100.0 fL   MCH 28.9 26.0 - 34.0 pg   MCHC 33.1 30.0 - 36.0 g/dL   RDW 14.5 11.5 - 15.5 %   Platelets 62 (L) 150 - 400 K/uL    Comment: CONSISTENT WITH PREVIOUS RESULT  Basic metabolic panel     Status: Abnormal   Collection Time: 09/15/14  5:38 AM  Result Value Ref Range    Sodium 134 (L) 135 - 145 mmol/L    Comment: Please note change in reference range.   Potassium 3.6 3.5 - 5.1 mmol/L    Comment: Please note change in reference range. DELTA CHECK NOTED REPEATED TO VERIFY NO VISIBLE HEMOLYSIS    Chloride 112 96 - 112 mEq/L   CO2 15 (L) 19 - 32 mmol/L   Glucose, Bld 162 (H) 70 - 99 mg/dL   BUN 28 (H) 6 - 23 mg/dL   Creatinine, Ser 0.73 0.50 - 1.10 mg/dL    Comment: DELTA CHECK NOTED REPEATED TO VERIFY    Calcium 6.5 (L) 8.4 - 10.5 mg/dL   GFR calc non Af Amer >90 >90 mL/min   GFR calc Af Amer >90 >90 mL/min    Comment: (NOTE) The eGFR has been calculated using the CKD EPI equation. This calculation has not been validated in all clinical situations. eGFR's persistently <90 mL/min signify possible Chronic Kidney Disease.    Anion gap 7 5 - 15  CBC     Status: Abnormal   Collection Time: 09/15/14 12:00 PM  Result Value Ref Range   WBC 17.4 (H) 4.0 - 10.5 K/uL   RBC 2.75 (L) 3.87 - 5.11 MIL/uL   Hemoglobin 8.0 (L) 12.0 - 15.0 g/dL   HCT 23.5 (L) 36.0 - 46.0 %   MCV 85.5 78.0 - 100.0 fL   MCH 29.1 26.0 - 34.0 pg   MCHC 34.0 30.0 - 36.0 g/dL   RDW 14.7 11.5 - 15.5 %   Platelets 47 (L) 150 - 400 K/uL    Comment: DELTA CHECK NOTED REPEATED TO VERIFY   Prepare RBC     Status: None   Collection Time: 09/15/14  4:00 PM  Result Value Ref Range   Order Confirmation ORDER PROCESSED BY BLOOD BANK   CBC     Status: Abnormal   Collection Time: 09/16/14  1:35 AM  Result Value Ref Range   WBC 22.5 (H) 4.0 - 10.5 K/uL   RBC 3.88 3.87 - 5.11 MIL/uL   Hemoglobin 11.3 (L) 12.0 - 15.0 g/dL    Comment: DELTA CHECK NOTED POST TRANSFUSION SPECIMEN    HCT 33.0 (L) 36.0 - 46.0 %   MCV 85.1 78.0 - 100.0 fL   MCH 29.1 26.0 - 34.0 pg   MCHC  34.2 30.0 - 36.0 g/dL   RDW 14.5 11.5 - 15.5 %   Platelets 34 (L) 150 - 400 K/uL    Comment: DELTA CHECK NOTED REPEATED TO VERIFY SPECIMEN CHECKED FOR CLOTS PLATELET COUNT CONFIRMED BY SMEAR   CBC     Status:  Abnormal   Collection Time: 09/16/14  4:14 AM  Result Value Ref Range   WBC 20.8 (H) 4.0 - 10.5 K/uL   RBC 3.89 3.87 - 5.11 MIL/uL   Hemoglobin 11.5 (L) 12.0 - 15.0 g/dL   HCT 33.0 (L) 36.0 - 46.0 %   MCV 84.8 78.0 - 100.0 fL   MCH 29.6 26.0 - 34.0 pg   MCHC 34.8 30.0 - 36.0 g/dL   RDW 14.6 11.5 - 15.5 %   Platelets 31 (L) 150 - 400 K/uL    Comment: REPEATED TO VERIFY PLATELET COUNT CONFIRMED BY SMEAR CONSISTENT WITH PREVIOUS RESULT   Basic metabolic panel     Status: Abnormal   Collection Time: 09/16/14  4:25 AM  Result Value Ref Range   Sodium 138 135 - 145 mmol/L    Comment: Please note change in reference range.   Potassium 2.9 (L) 3.5 - 5.1 mmol/L    Comment: Please note change in reference range. DELTA CHECK NOTED    Chloride 118 (H) 96 - 112 mEq/L   CO2 17 (L) 19 - 32 mmol/L   Glucose, Bld 163 (H) 70 - 99 mg/dL   BUN 34 (H) 6 - 23 mg/dL   Creatinine, Ser 0.96 0.50 - 1.10 mg/dL   Calcium 7.2 (L) 8.4 - 10.5 mg/dL   GFR calc non Af Amer 63 (L) >90 mL/min   GFR calc Af Amer 72 (L) >90 mL/min    Comment: (NOTE) The eGFR has been calculated using the CKD EPI equation. This calculation has not been validated in all clinical situations. eGFR's persistently <90 mL/min signify possible Chronic Kidney Disease.    Anion gap 3 (L) 5 - 15  Magnesium     Status: Abnormal   Collection Time: 09/16/14  4:25 AM  Result Value Ref Range   Magnesium 1.3 (L) 1.5 - 2.5 mg/dL  Phosphorus     Status: Abnormal   Collection Time: 09/16/14  4:25 AM  Result Value Ref Range   Phosphorus 1.5 (L) 2.3 - 4.6 mg/dL  CBC     Status: Abnormal   Collection Time: 09/16/14 12:14 PM  Result Value Ref Range   WBC 13.6 (H) 4.0 - 10.5 K/uL   RBC 4.21 3.87 - 5.11 MIL/uL   Hemoglobin 12.2 12.0 - 15.0 g/dL   HCT 35.8 (L) 36.0 - 46.0 %   MCV 85.0 78.0 - 100.0 fL   MCH 29.0 26.0 - 34.0 pg   MCHC 34.1 30.0 - 36.0 g/dL   RDW 14.8 11.5 - 15.5 %   Platelets 24 (LL) 150 - 400 K/uL    Comment: RESULT  REPEATED AND VERIFIED SPECIMEN CHECKED FOR CLOTS CONSISTENT WITH PREVIOUS RESULT CRITICAL RESULT CALLED TO, READ BACK BY AND VERIFIED WITH: ARNOLD,A AT 1320 ON [id number] BY HOOKER,B     Imaging / Studies: Ct Abdomen Pelvis Wo Contrast  09/14/2014   CLINICAL DATA:  Recent Whipple procedure for pancreatic carcinoma in November 2017. New onset hematemesis.  EXAM: CT ABDOMEN AND PELVIS WITHOUT CONTRAST  TECHNIQUE: Multidetector CT imaging of the abdomen and pelvis was performed following the standard protocol without IV contrast.  COMPARISON:  CTA 03/04/2014  FINDINGS: Examination abdomen is limited by  lack of IV oral contrast. Recommend difficult n postsurgical patient.  Lower chest: There is focus of bandlike atelectasis at right lung base. No pleural fluid. No pericardial fluid.  Hepatobiliary: No focal hepatic lesion on theis non contrast exam. Liver poorly evaluated. No Jasper Hanf evidence duct dilatation. Gallbladder surgically absent.  Pancreas: There is a pancreatic stent extending from the remaining pancreas into the jejunum through the pancreatic jejunostomy. There is a ovoid focus of high density material measuring 2.5 by 1.3 cm adjacent to the distal aspect of the stent within the small bowel (image 28, series 2). Second high-density collection measuring 3.0 x 1.8 cm on image 32, series 2 several cm distal to the first collection. These could represent focus as of hematoma or hemorrhage. There is no Miriah Maruyama evidence of obstruction. There is no intraperitoneal free air.  Spleen: Normal.  Adrenals/urinary tract: Adrenal glands kidneys are normal. Ureters and bladder appear normal.  Stomach/Bowel: Stomach demonstrates postsurgical change consistent with Whipple procedure. There is some high-density material in the gastric fundus measuring 2.2 by 2.0 cm (image 23 from series 2). This could also represent small focus clot. The distal small bowel, appendix, and colon are unremarkable. Small mild fluid along the  pericolic gutters.  Vascular/Lymphatic: Abdominal or is normal caliber. No retroperitoneal periportal lymphadenopathy.  Reproductive: Uterus and ovaries are normal.  Musculoskeletal: No aggressive osseous lesion. Degenerate change the spine.  Other: [place] intracranial free air.  Small amount fluid.  IMPRESSION: 1. Two ovoid high-density lesions adjacent to the pancreatic stent within the jejunum could represent hemorrhage or hematoma. 2. No additional Rochanda Harpham evidence of surgical complication on this non IV and oral contrast exam. 3. Potential small additional clot within the stomach is indeterminate. Findings conveyed toROBERT LOCKWOOD on 09/14/2014  at19:26.   Electronically Signed   By: Suzy Bouchard M.D.   On: 09/14/2014 19:27   Ct Abdomen Pelvis W Contrast  09/16/2014   CLINICAL DATA:  Melena and fever, history of pancreatic carcinoma as following Whipple procedure, elevated white blood cell count  EXAM: CT ABDOMEN AND PELVIS WITH CONTRAST  TECHNIQUE: Multidetector CT imaging of the abdomen and pelvis was performed using the standard protocol following bolus administration of intravenous contrast.  CONTRAST:  21m OMNIPAQUE IOHEXOL 300 MG/ML SOLN, 1028mOMNIPAQUE IOHEXOL 300 MG/ML SOLN  COMPARISON:  09/14/2014  FINDINGS: New moderate bilateral pleural effusions are identified with lower lobe infiltrate worse in the right base then the left particularly in the right middle lobe. These changes are new from the prior exam and may account for the patient's elevated white blood cell count.  The liver demonstrates diffuse irregular enhancement likely related this the timing of the contrast bolus as the delayed images show a relatively normal enhancement pattern. There is however evidence of thrombus within the main portal vein just before its bifurcation into the right and left portal veins. It is not occlusive but occupies the majority of the lumen of the portal vein.  The spleen, adrenal glands and kidneys  are normal in their CT appearance. No obstructive changes are noted.  Postoperative changes in the pancreas are seen with a pancreatic stent in place. Small amount of free fluid is noted within the abdomen which is new from the prior exam and likely related to the prior surgery. No definitive air-fluid collection to suggest abscess is identified.  The bladder is decompressed by Foley catheter. Diffuse anasarca is noted. The appendix is not well visualized although no inflammatory changes are seen. No pelvic mass  lesion is seen. The osseous structures are within normal limits for the patient's given age.  IMPRESSION: Changes consistent with prior Whipple procedure with pancreatic stent in place. Some postoperative free fluid is noted although no organized collection to suggest abscess is noted at this time.  New main portal vein thrombus which is nonocclusive but occupies a majority of the lumen of the main portal vein. There is some differential enhancement of the liver identified which in part is likely related to this thrombus and timing of the contrast bolus.  New bilateral pleural effusions with bibasilar infiltrates particularly in the right middle lobe. This may be the etiology of the patient's elevated white blood cell count. These are new from the prior exam from 09/14/2014.  Changes of anasarca.  These results were called by telephone at the time of interpretation on 09/16/2014 at 3:48 pm to Amy, the pts nurse, who verbally acknowledged these results.   Electronically Signed   By: Inez Catalina M.D.   On: 09/16/2014 15:50   Dg Chest Port 1 View  09/14/2014   CLINICAL DATA:  Central line placement.  Initial encounter.  EXAM: PORTABLE CHEST - 1 VIEW  COMPARISON:  Chest radiograph performed 08/10/2014  FINDINGS: The patient's right IJ line is noted ending about the mid SVC.  The lungs are well-aerated. Mild vascular congestion is noted. Increased interstitial markings may reflect minimal interstitial edema.  There is no evidence of pleural effusion or pneumothorax.  The cardiomediastinal silhouette is borderline normal in size. No acute osseous abnormalities are seen.  IMPRESSION: 1. Right IJ line noted ending about the mid SVC. 2. Mild vascular congestion noted. Increased interstitial markings may reflect minimal interstitial edema.   Electronically Signed   By: Garald Balding M.D.   On: 09/14/2014 22:58   Dg Abd Portable 2v  09/16/2014   CLINICAL DATA:  Abdominal pain.  History of pancreatic cancer.  EXAM: PORTABLE ABDOMEN - 2 VIEW  COMPARISON:  CT abdomen 09/14/2014.  FINDINGS: Pancreatic stent remains centered over the mid abdomen. There is no bowel obstruction.  IMPRESSION: Negative.   Electronically Signed   By: Rolla Flatten M.D.   On: 09/16/2014 12:15    Medications / Allergies: per chart  Antibiotics: Anti-infectives    Start     Dose/Rate Route Frequency Ordered Stop   09/15/14 1800  piperacillin-tazobactam (ZOSYN) IVPB 3.375 g     3.375 g12.5 mL/hr over 240 Minutes Intravenous Every 8 hours 09/15/14 1741     09/14/14 2245  cefTRIAXone (ROCEPHIN) 1 g in dextrose 5 % 50 mL IVPB  Status:  Discontinued     1 g100 mL/hr over 30 Minutes Intravenous Every 24 hours 09/14/14 2238 09/15/14 1730       Note: Portions of this report may have been transcribed using voice recognition software. Every effort was made to ensure accuracy; however, inadvertent computerized transcription errors may be present.   Any transcriptional errors that result from this process are unintentional.

## 2014-09-16 NOTE — Progress Notes (Signed)
[facility] ADULT ICU REPLACEMENT PROTOCOL FOR AM LAB REPLACEMENT ONLY  The patient does apply for the [facility] Adult ICU Electrolyte Replacment Protocol based on the criteria listed below:   1. Is GFR >/= 40 ml/min? Yes.    Patient's GFR today is 72 2. Is urine output >/= 0.5 ml/kg/hr for the last 6 hours? Yes.   Patient's UOP is 0.56 ml/kg/hr 3. Is BUN < 60 mg/dL? Yes.    Patient's BUN today is 34 4. Abnormal electrolyte(s): Magnesium 1.3 5. Ordered repletion with: [facility] adult ICU replacement protocol 6. If a panic level lab has been reported, has the CCM MD in charge been notified? Yes.  .   Physician:  Dr. Benjamine Mola Deterding  [facility], Darrick Huntsman E 09/16/2014 5:38 AM

## 2014-09-16 NOTE — Progress Notes (Signed)
[facility] Progress Note Patient Name: Melinda Conrad DOB: 26-Sep-1950 MRN: [id number]   Date of Service  09/16/2014  HPI/Events of Note  Hypokalemia, hypophos, hypomag  eICU Interventions  Potassium, phos, mag replaced     Intervention Category Intermediate Interventions: Electrolyte abnormality - evaluation and management  DETERDING,ELIZABETH 09/16/2014, 5:34 AM

## 2014-09-16 NOTE — Progress Notes (Signed)
CRITICAL VALUE ALERT  Critical value received:  Platelets 24  Date of notification:  09/16/14  Time of notification:  12;20  Critical value read back: yes  Nurse who received alert:  Lacinda Axon RN  MD notified (1st page):  Dr. Elsworth Soho  Time of first page:  12:20  MD notified (2nd page):n/a  Time of second page:n/a  Responding MD:  Dr/ Elsworth Soho  Time MD responded:  12:30

## 2014-09-16 NOTE — Progress Notes (Signed)
[facility] Progress Note  Subjective:  No further bleeding per patient's nurse.  Patient continues to complaint of a lot of abdominal pain and is getting Fentanyl almost every hour.  Objective:  Vital signs in last 24 hours: Temp:  [97.7 F (36.5 C)-98.5 F (36.9 C)] 97.7 F (36.5 C) (12/27 0400) Pulse Rate:  [58-114] 95 (12/27 0700) Resp:  [20-46] 26 (12/27 0700) BP: (64-139)/(35-92) 103/73 mmHg (12/27 0700) SpO2:  [93 %-100 %] 95 % (12/27 0700) Weight:  [121 lb 7.6 oz (55.1 kg)] 121 lb 7.6 oz (55.1 kg) (12/27 0600) Last BM Date: 09/15/14 General:  Alert, chronically ill-appearing, moaning Heart:  Regular rate and rhythm; no murmurs Pulm:  CTAB.  No W/R/R. Abdomen:  Soft, non-distended.  BS present.  Diffuse TTP without R/R/G. Extremities:  Without edema.  Intake/Output from previous day: 12/26 0701 - 12/27 0700 In: 4699.7 [I.V.:3144.7; Blood:845; IV EPPIRJJOA:416] Out: 580 [Urine:580] Intake/Output this shift: Total I/O In: 50 [IV Piggyback:50] Out: -   Lab Results:  Recent Labs  09/15/14 1200 09/16/14 0135 09/16/14 0414  WBC 17.4* 22.5* 20.8*  HGB 8.0* 11.3* 11.5*  HCT 23.5* 33.0* 33.0*  PLT 47* 34* 31*   BMET  Recent Labs  09/14/14 1721 09/15/14 0538 09/16/14 0425  NA 139 134* 138  K 2.6* 3.6 2.9*  CL 107 112 118*  CO2 13* 15* 17*  GLUCOSE 69* 162* 163*  BUN 28* 28* 34*  CREATININE 1.18* 0.73 0.96  CALCIUM 7.6* 6.5* 7.2*   LFT  Recent Labs  09/14/14 1721  PROT 6.3  ALBUMIN 1.8*  AST 89*  ALT <5  ALKPHOS 401*  BILITOT 1.5*   PT/INR  Recent Labs  09/15/14 0038  LABPROT 18.5*  INR 1.52*   Ct Abdomen Pelvis Wo Contrast  09/14/2014   CLINICAL DATA:  Recent Whipple procedure for pancreatic carcinoma in November 2017. New onset hematemesis.  EXAM: CT ABDOMEN AND PELVIS WITHOUT CONTRAST  TECHNIQUE: Multidetector CT imaging of the abdomen and pelvis was performed following the standard protocol without IV contrast.   COMPARISON:  CTA 03/04/2014  FINDINGS: Examination abdomen is limited by lack of IV oral contrast. Recommend difficult n postsurgical patient.  Lower chest: There is focus of bandlike atelectasis at right lung base. No pleural fluid. No pericardial fluid.  Hepatobiliary: No focal hepatic lesion on theis non contrast exam. Liver poorly evaluated. No gross evidence duct dilatation. Gallbladder surgically absent.  Pancreas: There is a pancreatic stent extending from the remaining pancreas into the jejunum through the pancreatic jejunostomy. There is a ovoid focus of high density material measuring 2.5 by 1.3 cm adjacent to the distal aspect of the stent within the small bowel (image 28, series 2). Second high-density collection measuring 3.0 x 1.8 cm on image 32, series 2 several cm distal to the first collection. These could represent focus as of hematoma or hemorrhage. There is no gross evidence of obstruction. There is no intraperitoneal free air.  Spleen: Normal.  Adrenals/urinary tract: Adrenal glands kidneys are normal. Ureters and bladder appear normal.  Stomach/Bowel: Stomach demonstrates postsurgical change consistent with Whipple procedure. There is some high-density material in the gastric fundus measuring 2.2 by 2.0 cm (image 23 from series 2). This could also represent small focus clot. The distal small bowel, appendix, and colon are unremarkable. Small mild fluid along the pericolic gutters.  Vascular/Lymphatic: Abdominal or is normal caliber. No retroperitoneal periportal lymphadenopathy.  Reproductive: Uterus and ovaries are normal.  Musculoskeletal: No aggressive osseous  lesion. Degenerate change the spine.  Other: [place] intracranial free air.  Small amount fluid.  IMPRESSION: 1. Two ovoid high-density lesions adjacent to the pancreatic stent within the jejunum could represent hemorrhage or hematoma. 2. No additional gross evidence of surgical complication on this non IV and oral contrast exam. 3.  Potential small additional clot within the stomach is indeterminate. Findings conveyed toROBERT LOCKWOOD on 09/14/2014  at19:26.   Electronically Signed   By: Suzy Bouchard M.D.   On: 09/14/2014 19:27   Dg Chest Port 1 View  09/14/2014   CLINICAL DATA:  Central line placement.  Initial encounter.  EXAM: PORTABLE CHEST - 1 VIEW  COMPARISON:  Chest radiograph performed 08/10/2014  FINDINGS: The patient's right IJ line is noted ending about the mid SVC.  The lungs are well-aerated. Mild vascular congestion is noted. Increased interstitial markings may reflect minimal interstitial edema. There is no evidence of pleural effusion or pneumothorax.  The cardiomediastinal silhouette is borderline normal in size. No acute osseous abnormalities are seen.  IMPRESSION: 1. Right IJ line noted ending about the mid SVC. 2. Mild vascular congestion noted. Increased interstitial markings may reflect minimal interstitial edema.   Electronically Signed   By: Garald Balding M.D.   On: 09/14/2014 22:58    Assessment / Plan: 1. Acute GI bleed in setting of chronic NSAID use:  Bleeding may be due to active peptic ulcer disease related to NSAIDs. Doubt tumor invasion in view of recent pancreatic resection. 2. Pancreatic CA tatus post Whipple in 07/2014 with perineural invasion and one positive lymph node 3.  Acute blood loss anemia:  Hgb now stable after transfusion with PRBC's. 4.  Hypotension:  On pressors (levophed, low dose). 5.  Thrombocytopenia:  Platelets 31 this AM. 6. Hypokalemia:  Correction per primary service.  *Will check 2 view abdominal x-ray this AM for ongoing complaints of abdominal pain. *Will hold off with EGD for now since bleeding has subsided at this point. *If she rebleeds then she needs to receive platelets as well. *Monitor Hgb and transfuse further prn. *Continue protonix 40 mg IV BID for now.   LOS: 2 days   ZEHR, JESSICA D.  09/16/2014, 9:05 AM  Pager number [phone]  GI  Attending Note  I have personally taken an interval history, reviewed the chart, and examined the patient.  No further GI bleeding.  Abdominal pain continues to be an issue and blood cultures are positive for gram-negative rods.  Sepsis would account for hypotension and thrombocytopenia.  CT on admission was done without IV or oral contrast and therefore was a limited exam.  High density material possibly representing blood was seen in the stomach.  Source for bleeding and sepsis may be related.  Recommendations #1 repeat CT with oral and IV contrast #2 hold EGD pending results of #1 Sandy Salaam. Deatra Ina, MD, [facility] [phone]

## 2014-09-16 NOTE — Progress Notes (Signed)
Radiology called report of CT scan and told nurse to notify doctor of results.  Notified Dr. Melvyn Novas in [place] about results, and was instructed to call Dr. Deatra Ina to give him the results.  Called Dr. Deatra Ina results of CT scan result.  No new orders at that time.  Dr. Deatra Ina called nurse to ask her to put in order for heparin per pharmacy.  Patient had one old bloody stool today and patient's platelets are currently 24.  Continue to monitor for bleeding closely.   Clemence Stillings Roselie Awkward RN

## 2014-09-16 NOTE — Progress Notes (Signed)
PULMONARY / CRITICAL CARE MEDICINE   Name: Melinda Conrad MRN: [id number] DOB: [birth year]    ADMISSION DATE:  09/14/2014  INITIAL PRESENTATION:  Admitted to PCCM service via ED with melena X several days, isolated fever on evening prior to admission (to 104.0 and trated with ibuprofen), episode of hematemesis on evening of presentation to ED for which EMS was dispatched. In [facility] ED, pt hypotensive and lethargic - both improved with IVFs and PRBCs. Recent diagnosis of pancreatic cancer, s/p Whpple procedure 08/07/14. Pt indicates that she uses ibuprofen fairly regularly.   SIGNIFICANT EVENTS/STUDIES:  12/25 CT abd/pelvis: Two ovoid high-density lesions adjacent to the pancreatic stent within the jejunum could represent hemorrhage or hematoma. No additional gross evidence of surgical complication on this non IV and oral contrast exam. Potential small additional clot within the stomach is indeterminate 12/25 Admitted with UGIB to ICU. 2 units PRBCs ordered   INDWELLING DEVICES:: R IJ CVL 12/25 >>   MICRO DATA: Blood 12/25 >> GNR >>  ANTIMICROBIALS:  Ceftriaxone 12/25 >> 12/26 Zosyn 12/26 >>   SUBJECTIVE: c/o abd pain defervesced Improved BP, coming off levo gt  VITAL SIGNS: Temp:  [97.7 F (36.5 C)-98.5 F (36.9 C)] 98.5 F (36.9 C) (12/27 1135) Pulse Rate:  [89-114] 92 (12/27 1130) Resp:  [20-46] 27 (12/27 1130) BP: (83-139)/(50-92) 97/63 mmHg (12/27 1130) SpO2:  [92 %-100 %] 95 % (12/27 1130) Weight:  [55.1 kg (121 lb 7.6 oz)] 55.1 kg (121 lb 7.6 oz) (12/27 0600) HEMODYNAMICS: CVP:  [9 mmHg-14 mmHg] 10 mmHg VENTILATOR SETTINGS:   INTAKE / OUTPUT:  Intake/Output Summary (Last 24 hours) at 09/16/14 1141 Last data filed at 09/16/14 1133  Gross per 24 hour  Intake 4408.3 ml  Output    880 ml  Net 3528.3 ml    PHYSICAL EXAMINATION: General:  Cachectic, frail pleasant, no respiratory distress Neuro: RASS 0, + F/C, cognition intact, MAEs HEENT: temporal wasting,  otherwise WNL Cardiovascular: mildly tachy, few extrasystoles, no M Lungs: no adventitious sounds Abdomen: scaphoid, soft,mild tenderness peri-umbilical, +BS EXT: muscle wasting, cool, no edema, 1+ pulses  LABS:  CBC  Recent Labs Lab 09/15/14 1200 09/16/14 0135 09/16/14 0414  WBC 17.4* 22.5* 20.8*  HGB 8.0* 11.3* 11.5*  HCT 23.5* 33.0* 33.0*  PLT 47* 34* 31*   Coag's  Recent Labs Lab 09/15/14 0038  APTT 36  INR 1.52*   BMET  Recent Labs Lab 09/14/14 1721 09/15/14 0538 09/16/14 0425  NA 139 134* 138  K 2.6* 3.6 2.9*  CL 107 112 118*  CO2 13* 15* 17*  BUN 28* 28* 34*  CREATININE 1.18* 0.73 0.96  GLUCOSE 69* 162* 163*   Electrolytes  Recent Labs Lab 09/14/14 1721 09/15/14 0538 09/16/14 0425  CALCIUM 7.6* 6.5* 7.2*  MG  --   --  1.3*  PHOS  --   --  1.5*   Sepsis Markers  Recent Labs Lab 09/14/14 1731 09/14/14 2241  LATICACIDVEN 11.22* 3.7*   ABG No results for input(s): PHART, PCO2ART, PO2ART in the last 168 hours. Liver Enzymes  Recent Labs Lab 09/14/14 1721  AST 89*  ALT <5  ALKPHOS 401*  BILITOT 1.5*  ALBUMIN 1.8*   Cardiac Enzymes No results for input(s): TROPONINI, PROBNP in the last 168 hours. Glucose  Recent Labs Lab 09/15/14 0208  GLUCAP 127*    CXR:   ASSESSMENT / PLAN:  PULMONARY A: No issues P:   Supp O2 as needed  CARDIOVASCULAR A:  Hemorrhagic shock -lactate cleared  P:  Volume and blood products to maintain MAP > 60 mmHg Taper off  levophed gtt  RENAL A:   AKI - likely shock +/- NSAIDS -resolved Hypokalemia/hypomag P:   Monitor BMET intermittently Monitor I/Os Correct electrolytes as indicated   GASTROINTESTINAL A:   UGIB - likely gastric or duodenal ulcer. R/O varices Pancreatic cancer - recent Whipple 08/07/14, pancreatic stents Elevated LFTs - favor obstructive pattern Severe protein-calorie malnutrition P:   Double dose PPI GI consulting -rpt CT ordered NPO for now Will notify  Oncology Benay Spice) as courtesy 12/28  It seems that she has poor insight regarding prognosis  HEMATOLOGIC A:   Acute blood loss anemia Anemia of chronic illness Thrombocytopenia   P:  DVT px: SCDs Monitor CBC intermittently Transfuse for hemorrhagic shock Transfuse plts if bleed recurs   INFECTIOUS A:  GNR bacteremia ? source P:   Micro and abx as above   ENDOCRINE A:   No issues P:   Monitor glu on chem panels  NEUROLOGIC A:   Post surgical and cancer related pain Very poor functional status P:   RASS goal: 0 PRN fentanyl Palliative Care consultation requested 12/25 for goals of care   The patient is critically ill with multiple organ systems failure and requires high complexity decision making for assessment and support, frequent evaluation and titration of therapies, application of advanced monitoring technologies and extensive interpretation of multiple databases. Critical Care Time devoted to patient care services described in this note independent of APP time is 31 minutes.    Kara Mead MD. [facility] Flood. [facility] Pager [phone] If no response call [phone]    09/16/2014, 11:41 AM

## 2014-09-16 NOTE — Progress Notes (Signed)
CT scan demonstrates a portal vein clot just proximal to the bifurcation.  There are no abscesses or other obvious sources for sepsis.  In view of bacteremia this may be a septic embolus. Concurrent abdominal pain suggests that it is acute. Rx for acute portal vein thrombosis is anticoagulation therapy.  Consideration for starting heparin is tempered by her acute GIB. Case was discussed with Drs. Gross and [employer].  We all agree that heparin should be started.  We can either do an EGD prior to staring heparin or defer and scope expectantly if she rebleeds.  Dr. Carlean Purl is now covering and wishes to do the latter.  Should she start to rebleed will have to d/c heparin and do EGD.  Plan to continue PPI Rx.

## 2014-09-16 NOTE — Progress Notes (Signed)
Physician orders received from GI to begin heparin per pharmacy protocol. Due to concerns over bleeding risk and low platelet count,  Contacted [facility] E-Link physician (Dr. Melvyn Novas) for guidance and received new orders NOT to begin IV heparin at this time.  Clayburn Pert, PharmD, BCPS Pager: [phone] 09/16/2014  6:14 PM

## 2014-09-16 NOTE — Progress Notes (Signed)
ANTICOAGULATION CONSULT NOTE - Initial Consult  Pharmacy Consult for Heparin Indication: portal vein thrombosis  No Known Allergies  Patient Measurements: Height: 5\' 3"  (160 cm) Weight: 121 lb 7.6 oz (55.1 kg) IBW/kg (Calculated) : 52.4   Vital Signs: Temp: 98.5 F (36.9 C) (12/27 1135) Temp Source: Oral (12/27 1135) BP: 100/55 mmHg (12/27 1900) Pulse Rate: 93 (12/27 1900)  Labs:  Recent Labs  09/14/14 1721 09/15/14 0038  09/15/14 0538  09/16/14 0135 09/16/14 0414 09/16/14 0425 09/16/14 1214  HGB 5.8*  --   < >  --   < > 11.3* 11.5*  --  12.2  HCT 17.7*  --   < >  --   < > 33.0* 33.0*  --  35.8*  PLT 76*  --   < >  --   < > 34* 31*  --  24*  APTT  --  36  --   --   --   --   --   --   --   LABPROT  --  18.5*  --   --   --   --   --   --   --   INR  --  1.52*  --   --   --   --   --   --   --   CREATININE 1.18*  --   --  0.73  --   --   --  0.96  --   < > = values in this interval not displayed.  Estimated Creatinine Clearance: 50.9 mL/min (by C-G formula based on Cr of 0.96).   Medical History: Past Medical History  Diagnosis Date  . Arthritis   . Depression   . H/O: substance abuse   . Back pain   . Personal history of colonic polyp - adenoma 10/26/2013    10/26/2013 diminutive sigmoid polyp removed    . Pancreatitis, acute   . Hypertension   . Osteoporosis   . Radiation 04/30/14-06/08/14    pancreas 50 gray  . Cancer     pancreatic cancer    Medications:  Scheduled:  . antiseptic oral rinse  7 mL Mouth Rinse q12n4p  . chlorhexidine  15 mL Mouth Rinse BID  . pantoprazole (PROTONIX) IV  40 mg Intravenous Q12H  . piperacillin-tazobactam (ZOSYN)  IV  3.375 g Intravenous Q8H   Infusions:  . sodium chloride    . dextrose 5 % and 0.9% NaCl 75 mL/hr at 09/16/14 0700  . heparin    . norepinephrine (LEVOPHED) Adult infusion Stopped (09/16/14 0930)   PRN: sodium chloride, acetaminophen, fentaNYL, metoCLOPramide (REGLAN) injection, metoprolol, ondansetron  (ZOFRAN) IV **OR** ondansetron (ZOFRAN) IV  Assessment: 63 y/o F with recently diagnosed pancreatic adenocarcinoma admitted with hemorrhagic shock secondary to GI bleed after recently taking ibuprofen.  GI states bleeding may be due to active peptic ulcer disease related to NSAID, but EGD was deferred after hemorrhage was controlled and patient was transfused.   Of note, platelet count is low at 24K and patient also has gram-negative rods growing in blood culture.  CT abdomen tonight shows new main portal vein thrombus; nonocclusive, possibly septic, but occupying a majority of the lumen.  Per MD assessment, patient is at risk of infarction unless anticoagulation is started.   MD acknowledges the risk of hemorrhage in setting of severe thrombocytopenia.   Orders received to begin IV heparin infusion without bolus and with pharmacy dosing assistance.     Goal of Therapy:  Heparin level approximately 0.3 - 0.4 units/mL (near lower end of therapeutic range). Monitor platelets by anticoagulation protocol: Yes   Plan:  1. Begin heparin at 500 units/hr IV infusion (no bolus). 2. Heparin level and CBC at 2am 3. Monitor for evidence of recurrent bleeding.  Clayburn Pert, PharmD, BCPS Pager: [phone] 09/16/2014  7:39 PM

## 2014-09-17 ENCOUNTER — Inpatient Hospital Stay (HOSPITAL_COMMUNITY): Payer: Medicaid Other

## 2014-09-17 DIAGNOSIS — Z720 Tobacco use: Secondary | ICD-10-CM

## 2014-09-17 DIAGNOSIS — C250 Malignant neoplasm of head of pancreas: Secondary | ICD-10-CM

## 2014-09-17 DIAGNOSIS — D696 Thrombocytopenia, unspecified: Secondary | ICD-10-CM

## 2014-09-17 DIAGNOSIS — K5900 Constipation, unspecified: Secondary | ICD-10-CM

## 2014-09-17 DIAGNOSIS — J9690 Respiratory failure, unspecified, unspecified whether with hypoxia or hypercapnia: Secondary | ICD-10-CM

## 2014-09-17 DIAGNOSIS — R630 Anorexia: Secondary | ICD-10-CM

## 2014-09-17 DIAGNOSIS — B961 Klebsiella pneumoniae [K. pneumoniae] as the cause of diseases classified elsewhere: Secondary | ICD-10-CM

## 2014-09-17 DIAGNOSIS — I81 Portal vein thrombosis: Secondary | ICD-10-CM

## 2014-09-17 DIAGNOSIS — A419 Sepsis, unspecified organism: Secondary | ICD-10-CM | POA: Diagnosis present

## 2014-09-17 DIAGNOSIS — G893 Neoplasm related pain (acute) (chronic): Secondary | ICD-10-CM

## 2014-09-17 DIAGNOSIS — F1099 Alcohol use, unspecified with unspecified alcohol-induced disorder: Secondary | ICD-10-CM

## 2014-09-17 DIAGNOSIS — R634 Abnormal weight loss: Secondary | ICD-10-CM

## 2014-09-17 DIAGNOSIS — R7881 Bacteremia: Secondary | ICD-10-CM

## 2014-09-17 HISTORY — DX: Portal vein thrombosis: I81

## 2014-09-17 LAB — CBC
HEMATOCRIT: 33.3 % — AB (ref 36.0–46.0)
HEMATOCRIT: 33.4 % — AB (ref 36.0–46.0)
HEMATOCRIT: 33.9 % — AB (ref 36.0–46.0)
HEMOGLOBIN: 11.7 g/dL — AB (ref 12.0–15.0)
Hemoglobin: 11.6 g/dL — ABNORMAL LOW (ref 12.0–15.0)
Hemoglobin: 11.8 g/dL — ABNORMAL LOW (ref 12.0–15.0)
MCH: 29.2 pg (ref 26.0–34.0)
MCH: 29.3 pg (ref 26.0–34.0)
MCH: 29.3 pg (ref 26.0–34.0)
MCHC: 34.8 g/dL (ref 30.0–36.0)
MCHC: 35 g/dL (ref 30.0–36.0)
MCHC: 34.8 g/dL (ref 30.0–36.0)
MCV: 83.9 fL (ref 78.0–100.0)
MCV: 83.7 fL (ref 78.0–100.0)
MCV: 84.1 fL (ref 78.0–100.0)
PLATELETS: 23 10*3/uL — AB (ref 150–400)
Platelets: 21 10*3/uL — CL (ref 150–400)
Platelets: 25 10*3/uL — CL (ref 150–400)
RBC: 3.97 MIL/uL (ref 3.87–5.11)
RBC: 3.99 MIL/uL (ref 3.87–5.11)
RBC: 4.03 MIL/uL (ref 3.87–5.11)
RDW: 14.7 % (ref 11.5–15.5)
RDW: 15.1 % (ref 11.5–15.5)
RDW: 15.1 % (ref 11.5–15.5)
WBC: 12.1 10*3/uL — AB (ref 4.0–10.5)
WBC: 12.5 10*3/uL — ABNORMAL HIGH (ref 4.0–10.5)
WBC: 11.8 10*3/uL — AB (ref 4.0–10.5)

## 2014-09-17 LAB — BASIC METABOLIC PANEL
ANION GAP: 3 — AB (ref 5–15)
BUN: 24 mg/dL — AB (ref 6–23)
CHLORIDE: 120 meq/L — AB (ref 96–112)
CO2: 17 mmol/L — ABNORMAL LOW (ref 19–32)
Calcium: 7.3 mg/dL — ABNORMAL LOW (ref 8.4–10.5)
Creatinine, Ser: 0.69 mg/dL (ref 0.50–1.10)
GFR calc non Af Amer: >90 mL/min (ref >90)
Glucose, Bld: 105 mg/dL — ABNORMAL HIGH (ref 70–99)
Potassium: 3.1 mmol/L — ABNORMAL LOW (ref 3.5–5.1)
Sodium: 140 mmol/L (ref 135–145)

## 2014-09-17 LAB — HEPARIN LEVEL (UNFRACTIONATED): Heparin Unfractionated: <0.1 IU/mL — ABNORMAL LOW (ref 0.30–0.70)

## 2014-09-17 LAB — DIC (DISSEMINATED INTRAVASCULAR COAGULATION) PANEL
D DIMER QUANT: 13.9 ug{FEU}/mL — AB (ref 0.00–0.48)
INR: 1.39 (ref 0.00–1.49)
PLATELETS: 22 10*3/uL — AB (ref 150–400)
SMEAR REVIEW: NONE SEEN
aPTT: 48 seconds — ABNORMAL HIGH (ref 24–37)

## 2014-09-17 LAB — DIC (DISSEMINATED INTRAVASCULAR COAGULATION)PANEL
Fibrinogen: 381 mg/dL (ref 204–475)
Prothrombin Time: 17.2 seconds — ABNORMAL HIGH (ref 11.6–15.2)

## 2014-09-17 LAB — CULTURE, BLOOD (ROUTINE X 2)

## 2014-09-17 LAB — MAGNESIUM: MAGNESIUM: 2.4 mg/dL (ref 1.5–2.5)

## 2014-09-17 MED ORDER — DEXTROSE-NACL 5-0.45 % IV SOLN
INTRAVENOUS | Status: DC
  Administered 2014-09-17: 75 mL/h via INTRAVENOUS
  Administered 2014-09-20: 20:00:00 via INTRAVENOUS

## 2014-09-17 MED ORDER — POTASSIUM CHLORIDE 10 MEQ/100ML IV SOLN
10.0000 meq | INTRAVENOUS | Status: AC
  Administered 2014-09-17 (×6): 10 meq via INTRAVENOUS
  Filled 2014-09-17 (×6): qty 100

## 2014-09-17 MED ORDER — CEFTRIAXONE SODIUM IN DEXTROSE 40 MG/ML IV SOLN
2.0000 g | INTRAVENOUS | Status: DC
  Administered 2014-09-17 – 2014-09-26 (×10): 2 g via INTRAVENOUS
  Filled 2014-09-17 (×10): qty 50

## 2014-09-17 MED ORDER — HYDROCORTISONE NA SUCCINATE PF 100 MG IJ SOLR
50.0000 mg | Freq: Four times a day (QID) | INTRAMUSCULAR | Status: DC
  Administered 2014-09-17 – 2014-09-18 (×4): 50 mg via INTRAVENOUS
  Filled 2014-09-17 (×4): qty 2

## 2014-09-17 MED ORDER — HEPARIN (PORCINE) IN NACL 100-0.45 UNIT/ML-% IJ SOLN
800.0000 [IU]/h | INTRAMUSCULAR | Status: DC
  Filled 2014-09-17: qty 250

## 2014-09-17 NOTE — Progress Notes (Signed)
PULMONARY / CRITICAL CARE MEDICINE   Name: Melinda Conrad MRN: [id number] DOB: Jun 12, 1951    ADMISSION DATE:  09/14/2014  INITIAL PRESENTATION:  Admitted to PCCM service via ED with melena X several days, isolated fever on evening prior to admission (to 104.0 and treated with ibuprofen), episode of hematemesis on evening of presentation to ED for which EMS was dispatched. In [facility] ED, pt hypotensive and lethargic - both improved with IVFs and PRBCs. Recent diagnosis of pancreatic cancer, s/p Whipple procedure 08/07/14. Pt indicates that she uses ibuprofen fairly regularly.   SIGNIFICANT EVENTS/STUDIES:  12/25 CT abd/pelvis: Two ovoid high-density lesions adjacent to the pancreatic stent within the jejunum could represent hemorrhage or hematoma. No additional gross evidence of surgical complication on this non IV and oral contrast exam. Potential small additional clot within the stomach is indeterminate 12/25 Admitted with UGIB to ICU. 2 units PRBCs ordered  INDWELLING DEVICES:: R IJ CVL 12/25 >>   MICRO DATA: Blood 12/25 >> Klebsiella (pan sens)  ANTIMICROBIALS:  Ceftriaxone 12/25 >> 12/26 Zosyn 12/26 >>12/28 Rocephin 12/28>>>  SUBJECTIVE: No distress   VITAL SIGNS: Temp:  [97.5 F (36.4 C)-99.1 F (37.3 C)] 97.5 F (36.4 C) (12/28 0800) Pulse Rate:  [80-101] 84 (12/28 0800) Resp:  [16-45] 22 (12/28 0800) BP: (83-153)/(54-92) 110/78 mmHg (12/28 0800) SpO2:  [90 %-96 %] 90 % (12/28 0800) Weight:  [56.3 kg (124 lb 1.9 oz)] 56.3 kg (124 lb 1.9 oz) (12/28 0400) HEMODYNAMICS: CVP:  [1 mmHg-12 mmHg] 1 mmHg VENTILATOR SETTINGS:   INTAKE / OUTPUT:  Intake/Output Summary (Last 24 hours) at 09/17/14 0946 Last data filed at 09/17/14 0800  Gross per 24 hour  Intake 2398.1 ml  Output   1915 ml  Net  483.1 ml   PHYSICAL EXAMINATION: General:  Cachectic, frail pleasant, no respiratory distress Neuro: RASS 0, + F/C, cognition intact, MAEs HEENT: temporal wasting, otherwise  WNL Cardiovascular: mildly tachy, few extrasystoles, no M Lungs: basilar rales  Abdomen: scaphoid, soft,mild tenderness peri-umbilical, +BS EXT: muscle wasting, cool, no edema, 1+ pulses  LABS:  CBC  Recent Labs Lab 09/16/14 1214 09/16/14 2014 09/17/14 0230  WBC 13.6* 13.0* 12.1*  HGB 12.2 12.2 11.6*  HCT 35.8* 35.1* 33.3*  PLT 24* 26* 21*   Coag's  Recent Labs Lab 09/15/14 0038  APTT 36  INR 1.52*   BMET  Recent Labs Lab 09/15/14 0538 09/16/14 0425 09/17/14 0230  NA 134* 138 140  K 3.6 2.9* 3.1*  CL 112 118* 120*  CO2 15* 17* 17*  BUN 28* 34* 24*  CREATININE 0.73 0.96 0.69  GLUCOSE 162* 163* 105*   Electrolytes  Recent Labs Lab 09/15/14 0538 09/16/14 0425 09/16/14 2000 09/17/14 0230  CALCIUM 6.5* 7.2*  --  7.3*  MG  --  1.3* 2.6* 2.4  PHOS  --  1.5*  --   --    Sepsis Markers  Recent Labs Lab 09/14/14 1731 09/14/14 2241  LATICACIDVEN 11.22* 3.7*   ABG No results for input(s): PHART, PCO2ART, PO2ART in the last 168 hours. Liver Enzymes  Recent Labs Lab 09/14/14 1721  AST 89*  ALT <5  ALKPHOS 401*  BILITOT 1.5*  ALBUMIN 1.8*   Cardiac Enzymes No results for input(s): TROPONINI, PROBNP in the last 168 hours. Glucose  Recent Labs Lab 09/15/14 0208  GLUCAP 127*   CXR:   ASSESSMENT / PLAN:  PULMONARY A: Hypoxia.  Now desats w/ O2. ? TRALI (got 4 units PRBC) P:   - Supp O2  as needed - CXR with fluid overload and RLL infiltrate.  CARDIOVASCULAR A:  Hemorrhagic shock -lactate cleared Off pressors P:  - Volume and blood products to maintain SBP >90  - Will need diureses when BP is more stable. - Check cortisol level. - Stress dose steroids.  RENAL A:   AKI - likely shock +/- NSAIDS -resolved NAG metabolic acidosis  Hypokalemia/hypomag P:   - Monitor BMET intermittently - Monitor I/Os - Correct electrolytes as indicated - Change IVF to 1/2 NS   GASTROINTESTINAL A:   UGIB - likely gastric or duodenal ulcer.  R/O varices, also consider ischemia related bleeding Pancreatic cancer - recent Whipple 08/07/14, pancreatic stents Portal vein Thrombosis  Severe protein-calorie malnutrition P:   - Double dose PPI. - D/C IV heparin. - NPO for now - Will notify Oncology Benay Spice) as courtesy 12/28 - It seems that she has poor insight regarding prognosis, also assist w/ thrombocytopenia etc...  HEMATOLOGIC A:   Acute blood loss anemia Anemia of chronic illness Thrombocytopenia   P:  - D/C heparin. - Monitor CBC intermittently - Transfuse for hemorrhagic shock as needed  - Transfuse plts if bleed recurs - Heme consult pending   INFECTIOUS A: Klebsiella bacteremia ? Source possibly GI translocation  P:   - Micro and abx as above  ENDOCRINE A:   No issues P:   - Monitor glu on chem panels  NEUROLOGIC A:   Post surgical and cancer related pain Very poor functional status P:   - RASS goal: 0 - PRN fentanyl - Palliative Care consultation requested 12/25 for goals of care  NP summary No distress. hgb stable, shock and organ failure resolved. GI following as is surgery. Source of bleeding unclear . Will repeat CXR re: hypoxia. But stable to SDU and IM to take over.   Erick Colace ACNP-BC [place] Pager # [phone] OR # [phone] if no answer  Platelets continue to drop.  Patient is clearly deteriorating.  Will need H/O to come speak with her regarding prognosis.  Will diurese as BP allows and will d/c heparin given low platelets and UGI bleeding.  Will hold in the ICU overnight.  CXR with RLL worse than the rest of lung fields concerning for multilobar pneumonia.  Will continue abx for now.  Patient has a very tenuous respiratory status and intubation is a really possibility here.  Hold in the ICU.  The patient is critically ill with multiple organ systems failure and requires high complexity decision making for assessment and support, frequent evaluation and  titration of therapies, application of advanced monitoring technologies and extensive interpretation of multiple databases.   Critical Care Time devoted to patient care services described in this note is  35  Minutes. This time reflects time of care of this signee Dr Jennet Maduro. This critical care time does not reflect procedure time, or teaching time or supervisory time of PA/NP/Med student/Med Resident etc but could involve care discussion time.  Rush Farmer, M.D. [facility] Pulmonary/Critical Care Medicine. Pager: [phone]. After hours pager: [phone].   09/17/2014, 9:46 AM

## 2014-09-17 NOTE — Progress Notes (Signed)
[facility] for Heparin Indication: portal vein thrombosis  Please see progress notes from Clayburn Pert, PharmD for full details.   Heparin level < 0.1 on 500 units/hr.  No IV interruptions per RN. Hgb 11.6 Plts 21k  Plan:  Increase heparin infusion to 800 units/hr  Recheck heparin level in 6 hours  Peggyann Juba, PharmD, BCPS Pharmacy: [phone] 09/17/2014 3:54 AM

## 2014-09-17 NOTE — Progress Notes (Signed)
IP PROGRESS NOTE  Subjective:   She was admitted on 09/14/2014 with melena and hematemesis. She had a fever at home. She has been diagnosed with bacteremia and portal vein thrombosis. She has severe thrombocytopenia. Ms. Hecht has abdominal pain. The stool is "dark "per the ICU nurse.  Objective: Vital signs in last 24 hours: Blood pressure 129/67, pulse 92, temperature 97.8 F (36.6 C), temperature source Oral, resp. rate 38, height 5\' 3"  (1.6 m), weight 124 lb 1.9 oz (56.3 kg), SpO2 89 %.  Intake/Output from previous day: 12/27 0701 - 12/28 0700 In: 2843.8 [I.V.:2203.8; IV Piggyback:640] Out: 5956 [Urine:1805]  Physical Exam:  HEENT: Oral cavity without bleeding Lungs: Scattered inspiratory rales Cardiac: Regular rate and rhythm Abdomen: Soft, diffuse tenderness, no mass, small opening in the midline incision with a gauze dressing in place Extremities: No leg edema Skin: No petechiae    Lab Results:  Recent Labs  09/17/14 0230 09/17/14 1000 09/17/14 1137  WBC 12.1*  --  12.5*  HGB 11.6*  --  11.7*  HCT 33.3*  --  33.4*  PLT 21* 22* 25*    BMET  Recent Labs  09/16/14 0425 09/17/14 0230  NA 138 140  K 2.9* 3.1*  CL 118* 120*  CO2 17* 17*  GLUCOSE 163* 105*  BUN 34* 24*  CREATININE 0.96 0.69  CALCIUM 7.2* 7.3*    Studies/Results: Ct Abdomen Pelvis W Contrast  09/16/2014   CLINICAL DATA:  Melena and fever, history of pancreatic carcinoma as following Whipple procedure, elevated white blood cell count  EXAM: CT ABDOMEN AND PELVIS WITH CONTRAST  TECHNIQUE: Multidetector CT imaging of the abdomen and pelvis was performed using the standard protocol following bolus administration of intravenous contrast.  CONTRAST:  78mL OMNIPAQUE IOHEXOL 300 MG/ML SOLN, 172mL OMNIPAQUE IOHEXOL 300 MG/ML SOLN  COMPARISON:  09/14/2014  FINDINGS: New moderate bilateral pleural effusions are identified with lower lobe infiltrate worse in the right base then the left particularly  in the right middle lobe. These changes are new from the prior exam and may account for the patient's elevated white blood cell count.  The liver demonstrates diffuse irregular enhancement likely related this the timing of the contrast bolus as the delayed images show a relatively normal enhancement pattern. There is however evidence of thrombus within the main portal vein just before its bifurcation into the right and left portal veins. It is not occlusive but occupies the majority of the lumen of the portal vein.  The spleen, adrenal glands and kidneys are normal in their CT appearance. No obstructive changes are noted.  Postoperative changes in the pancreas are seen with a pancreatic stent in place. Small amount of free fluid is noted within the abdomen which is new from the prior exam and likely related to the prior surgery. No definitive air-fluid collection to suggest abscess is identified.  The bladder is decompressed by Foley catheter. Diffuse anasarca is noted. The appendix is not well visualized although no inflammatory changes are seen. No pelvic mass lesion is seen. The osseous structures are within normal limits for the patient's given age.  IMPRESSION: Changes consistent with prior Whipple procedure with pancreatic stent in place. Some postoperative free fluid is noted although no organized collection to suggest abscess is noted at this time.  New main portal vein thrombus which is nonocclusive but occupies a majority of the lumen of the main portal vein. There is some differential enhancement of the liver identified which in part is likely related to  this thrombus and timing of the contrast bolus.  New bilateral pleural effusions with bibasilar infiltrates particularly in the right middle lobe. This may be the etiology of the patient's elevated white blood cell count. These are new from the prior exam from 09/14/2014.  Changes of anasarca.  These results were called by telephone at the time of  interpretation on 09/16/2014 at 3:48 pm to Amy, the pts nurse, who verbally acknowledged these results.   Electronically Signed   By: Inez Catalina M.D.   On: 09/16/2014 15:50   Dg Chest Port 1 View  09/17/2014   CLINICAL DATA:  63 year old female with history of healthcare associated pneumonia presenting with shortness of breath and chest pain.  EXAM: PORTABLE CHEST - 1 VIEW  COMPARISON:  Chest x-ray 09/14/2014.  FINDINGS: There is a right-sided internal jugular central venous catheter with tip terminating in the distal superior vena cava. Marked worsening of aeration, with widespread interstitial and airspace disease throughout the lungs bilaterally, most confluent in the right mid to lower lung, concerning for severe multilobar pneumonia. No definite pleural effusions. Pulmonary vasculature is obscured. Heart size is upper limits of normal. Atherosclerosis in the thoracic aorta.  IMPRESSION: 1. Interval development of widespread interstitial and airspace disease throughout the lungs bilaterally concerning for severe multilobar pneumonia.   Electronically Signed   By: Vinnie Langton M.D.   On: 09/17/2014 10:52   Dg Abd Portable 2v  09/16/2014   CLINICAL DATA:  Abdominal pain.  History of pancreatic cancer.  EXAM: PORTABLE ABDOMEN - 2 VIEW  COMPARISON:  CT abdomen 09/14/2014.  FINDINGS: Pancreatic stent remains centered over the mid abdomen. There is no bowel obstruction.  IMPRESSION: Negative.   Electronically Signed   By: Rolla Flatten M.D.   On: 09/16/2014 12:15    Medications: I have reviewed the patient's current medications.  Assessment/Plan:  1. 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  Pancreaticoduodenectomy 11/17/2015confirmed a pathologic ypT3,ypN1tumor with negative surgical margins 2. constipation-likely secondary to narcotic analgesics, improved  3. anorexia/weight loss  4. tobacco and alcohol use  5. history of a colon polyp in February 2015  6. depression 7. Pain secondary to pancreas cancer-improved 8. Klebsiella bacteremia 9. Respiratory failure/bilateral airspace disease 10. Acute portal vein thrombosis 11. Acute thrombocytopenia  Ms. Mahaffy is admitted with sepsis syndrome and GI bleeding in the setting of a recent pancreaticoduodenectomy. She has been diagnosed with a portal vein thrombosis and has severe thrombocytopenia. I discussed the difficult situation with Ms. Trice and her daughter. The acute thrombocytopenia is most likely secondary to sepsis, possible DIC, and consumption. Her hemoglobin has stabilized after red cell transfusions.  I suspect the thrombocytopenia will improve over the next few days as she is treated for infection. I will discuss the indication for anticoagulation therapy with the surgical service. I agree with holding heparin anticoagulation for now with the severe thrombocytopenia and presentation with GI bleeding.              The plan is to complete adjuvant gemcitabine chemotherapy for adjuvant treatment of the pancreas cancer. Plans for chemotherapy will  be placed on hold.     LOS: 3 days   Timmia Cogburn  09/17/2014, 2:17 PM

## 2014-09-17 NOTE — Progress Notes (Addendum)
    S:  Heparin started overnight. Stable overall, less abdominal pain. Melenic stool this AM per RN. Off pressors  O:  Blood pressure 110/78, pulse 84, temperature 97.5 F (36.4 C), temperature source Oral, resp. rate 22, height 5\' 3"  (1.6 m), weight 124 lb 1.9 oz (56.3 kg), SpO2 90 %.  Awake and alert Eyes anicteric Lungs are clear ant Cor s1 s2 Abdomen with upper midline scar and wound healing by secondary intent - clean, + few BS, soft and not tender Ext - no edema  Data:   Lab Results  Component Value Date   WBC 12.1* 09/17/2014   HGB 11.6* 09/17/2014   HCT 33.3* 09/17/2014   MCV 83.9 09/17/2014   PLT 21* 09/17/2014   Lab Results  Component Value Date   INR 1.52* 09/15/2014   INR 1.13 08/08/2014   INR  08/08/2014    QUESTIONABLE RESULTS, RECOMMEND RECOLLECT TO VERIFY   No results found for: PTT   Lab Results  Component Value Date   CREATININE 0.69 09/17/2014   BUN 24* 09/17/2014   NA 140 09/17/2014   K 3.1* 09/17/2014   CL 120* 09/17/2014   CO2 17* 09/17/2014    A/P  1. GI bleed - appears stable now 2. Portal vein thrombus - on heparin 3. Thrombocytopenia- ? From sepsis and or consumption 4. S/p Whipple 5. GNR sepsis/SIRS 6. Anemia - acute blood loss - improved  Complicated situation here in a critically ill patient. She is improved overall.  Do not think an endoscopy will change management now but we can do one if surgery thinks necessary. Thrombocytopenia and heparin limits possible therapy but does not completely exclude.   I recommend we have hematology consult on her. Will get a DIC panel also.  Currently on bid IV PPI - since stabilized will leave on this. Wonder if bleeding was not hypotension-induced ischemia but hx NSAIDS also could have caused gastritis, ulcers  Gatha Mayer, MD, [facility] [phone] (pager) 09/17/2014 9:28 AM

## 2014-09-17 NOTE — Progress Notes (Signed)
2 Days Post-Op  Subjective: Stable, tired, no bs. Incision looks fine open at base.  Pt says it's much smaller.   Objective: Vital signs in last 24 hours: Temp:  [98.1 F (36.7 C)-99.1 F (37.3 C)] 98.1 F (36.7 C) (12/28 0400) Pulse Rate:  [80-101] 84 (12/28 0800) Resp:  [16-45] 22 (12/28 0800) BP: (83-153)/(54-92) 110/78 mmHg (12/28 0800) SpO2:  [90 %-96 %] 90 % (12/28 0800) Weight:  [56.3 kg (124 lb 1.9 oz)] 56.3 kg (124 lb 1.9 oz) (12/28 0400) Last BM Date: 09/15/14 Nothing po recorded;  NPO x ice chips 4 BM's recorded yesterday, 1 this AM. Afebrile, RR up in the 20-30's K+3.1, WBC 12.1 Platelet count 21,000 Intake/Output from previous day: 12/27 0701 - 12/28 0700 In: 2843.8 [I.V.:2203.8; IV Piggyback:640] Out: 4696 [Urine:1805] Intake/Output this shift: Total I/O In: 93 [I.V.:93] Out: 110 [Urine:110]  General appearance: alert, cooperative and no distress Resp: clear to auscultation bilaterally and anterior, still rapid RR rate GI: soft, sore, no BS  Lab Results:   Recent Labs  09/16/14 2014 09/17/14 0230  WBC 13.0* 12.1*  HGB 12.2 11.6*  HCT 35.1* 33.3*  PLT 26* 21*    BMET  Recent Labs  09/16/14 0425 09/17/14 0230  NA 138 140  K 2.9* 3.1*  CL 118* 120*  CO2 17* 17*  GLUCOSE 163* 105*  BUN 34* 24*  CREATININE 0.96 0.69  CALCIUM 7.2* 7.3*   PT/INR  Recent Labs  09/15/14 0038  LABPROT 18.5*  INR 1.52*     Recent Labs Lab 09/14/14 1721  AST 89*  ALT <5  ALKPHOS 401*  BILITOT 1.5*  PROT 6.3  ALBUMIN 1.8*     Lipase     Component Value Date/Time   LIPASE <10* 09/14/2014 1721     Studies/Results: Ct Abdomen Pelvis W Contrast  09/16/2014   CLINICAL DATA:  Melena and fever, history of pancreatic carcinoma as following Whipple procedure, elevated white blood cell count  EXAM: CT ABDOMEN AND PELVIS WITH CONTRAST  TECHNIQUE: Multidetector CT imaging of the abdomen and pelvis was performed using the standard protocol following bolus  administration of intravenous contrast.  CONTRAST:  63mL OMNIPAQUE IOHEXOL 300 MG/ML SOLN, 167mL OMNIPAQUE IOHEXOL 300 MG/ML SOLN  COMPARISON:  09/14/2014  FINDINGS: New moderate bilateral pleural effusions are identified with lower lobe infiltrate worse in the right base then the left particularly in the right middle lobe. These changes are new from the prior exam and may account for the patient's elevated white blood cell count.  The liver demonstrates diffuse irregular enhancement likely related this the timing of the contrast bolus as the delayed images show a relatively normal enhancement pattern. There is however evidence of thrombus within the main portal vein just before its bifurcation into the right and left portal veins. It is not occlusive but occupies the majority of the lumen of the portal vein.  The spleen, adrenal glands and kidneys are normal in their CT appearance. No obstructive changes are noted.  Postoperative changes in the pancreas are seen with a pancreatic stent in place. Small amount of free fluid is noted within the abdomen which is new from the prior exam and likely related to the prior surgery. No definitive air-fluid collection to suggest abscess is identified.  The bladder is decompressed by Foley catheter. Diffuse anasarca is noted. The appendix is not well visualized although no inflammatory changes are seen. No pelvic mass lesion is seen. The osseous structures are within normal limits for the  patient's given age.  IMPRESSION: Changes consistent with prior Whipple procedure with pancreatic stent in place. Some postoperative free fluid is noted although no organized collection to suggest abscess is noted at this time.  New main portal vein thrombus which is nonocclusive but occupies a majority of the lumen of the main portal vein. There is some differential enhancement of the liver identified which in part is likely related to this thrombus and timing of the contrast bolus.  New  bilateral pleural effusions with bibasilar infiltrates particularly in the right middle lobe. This may be the etiology of the patient's elevated white blood cell count. These are new from the prior exam from 09/14/2014.  Changes of anasarca.  These results were called by telephone at the time of interpretation on 09/16/2014 at 3:48 pm to Amy, the pts nurse, who verbally acknowledged these results.   Electronically Signed   By: Inez Catalina M.D.   On: 09/16/2014 15:50   Dg Abd Portable 2v  09/16/2014   CLINICAL DATA:  Abdominal pain.  History of pancreatic cancer.  EXAM: PORTABLE ABDOMEN - 2 VIEW  COMPARISON:  CT abdomen 09/14/2014.  FINDINGS: Pancreatic stent remains centered over the mid abdomen. There is no bowel obstruction.  IMPRESSION: Negative.   Electronically Signed   By: Rolla Flatten M.D.   On: 09/16/2014 12:15    Medications: . antiseptic oral rinse  7 mL Mouth Rinse q12n4p  . chlorhexidine  15 mL Mouth Rinse BID  . pantoprazole (PROTONIX) IV  40 mg Intravenous Q12H  . piperacillin-tazobactam (ZOSYN)  IV  3.375 g Intravenous Q8H   . sodium chloride    . dextrose 5 % and 0.9% NaCl 75 mL/hr at 09/16/14 0700  . heparin 800 Units/hr (09/17/14 0410)  . norepinephrine (LEVOPHED) Adult infusion Stopped (09/16/14 0930)    Assessment/Plan Sepsis with temperature up to 104/hypotendsion Hematemesis/acute GI bleed Bacteremia on blood cultures  Portal vein Thrombosis Chronic Ibuprofen use for ongoing pain Anemia admit hemoglobin 5.8 Stage IIa (T3N0) adenocarcinoma of the pancreatic head, s/p neoadjuvant chemoradiation; s/p Diagnostic laparoscopy, Classic pancreaticoduodenectomy,  Placement of pancreatic stent, 08/07/14, Dr. Barry Dienes (hospitalized 11/16-12/1/15)   Plan:  Final decision was to place her on heparin and watch closely.   H/H is stable  Dr. Carlean Purl does not think an EGD would help at this time.  Concern for thrombocytopenia and need for anticoagulation persist.  We would recommend  a hematology consult and we will follow with you.  Dr. Barry Dienes should be back next 24 hours.    LOS: 3 days    JENNINGS,WILLARD 09/17/2014  Agree with above. Spoke with Dr. Nelda Marseille. She does not feel well.  Has a lot of abdominal pain - ?secondary to SMV thrombosis.  There is no evidence of leak/abscess on CT scan, but she does have some edematous bowel.  Platelet count - 21,000 - 09/17/2014 With severe thrombocytopenia - would probably hold anticoagulation for now.  Difficult patient with a lot of problems.  Alphonsa Overall, MD, [facility] Pager: [phone] Office phone:  [phone]    Alphonsa Overall, MD, [facility] Pager: [phone] Office phone:  [phone]

## 2014-09-17 NOTE — Progress Notes (Signed)
INITIAL NUTRITION ASSESSMENT  DOCUMENTATION CODES Per approved criteria  -Severe malnutrition in the context of chronic illness  Pt meets criteria for severe MALNUTRITION in the context of chronic illness as evidenced by PO intake < 75% for > one month, 8.3% body weight loss in one month.   INTERVENTION: -Diet advancement per MD -Recommend Ensure TID as diet advancement tolerated -RD to continue to monitor  NUTRITION DIAGNOSIS: Inadequate oral intake related to nausea/abd pain/decreased appetite as evidenced by PO intake < 75%, 10 lb wt loss in one month.   Goal: Pt to meet >/= 90% of their estimated nutrition needs    Monitor:  Diet order, total protein/energy intake, labs, weights, GI profile, education needs  Reason for Assessment: MST  63 y.o. female  Admitting Dx: Acute GI bleeding  ASSESSMENT: Admitted to PCCM service via ED with melena X several days, isolated fever on evening prior to admission (to 104.0 and trated with ibuprofen), episode of hematemesis on evening of presentation to ED for which EMS was dispatched  -Pt familiar to RD from previous admit in 07/2014 -Pt s/p Whipple procedure; was on IMPACT supplementation trial that required  3 supplements/day for 5 days pre op and 5 days post op .  -Pt continued to be followed by outpatient oncology RD post d/c. Was last seen on 08/24/14. RD had encouraged pt to consume Ensure TID, and was provided with complimentary case of supplement. RD also educated pt on increasing intake of high protein/kcal foods -Pt lethargic during initial RD assessment today. Pt reported ongoing decreased appetite, and not able to tolerate significant intake of solid foods. Was drinking Ensure at home, but was unable to quantify amount daily -Endorsed ongoing weight loss of 10 lb in past one month (8.3% body weight loss, severe for time frame) -NPO for GI bleed. Possible EGD pending surgery recommendations. Palliative care c/s  pending. -Refeeding risk d/t chronic illness, severe wt loss, and prolonged period of sup-optimal intake ( > one  Month) -Phos/Mg/K low on admit. Mg now WNL w/repletion  Height: Ht Readings from Last 1 Encounters:  09/15/14 5\' 3"  (1.6 m)    Weight: Wt Readings from Last 1 Encounters:  09/17/14 124 lb 1.9 oz (56.3 kg)  Re weighed  Pt: 111 lb  Ideal Body Weight: 115 lb  % Ideal Body Weight: 97%  Wt Readings from Last 10 Encounters:  09/17/14 124 lb 1.9 oz (56.3 kg)  09/10/14 109 lb 9.6 oz (49.714 kg)  08/07/14 118 lb 6.2 oz (53.7 kg)  08/02/14 121 lb 11.2 oz (55.203 kg)  07/30/14 121 lb (54.885 kg)  07/26/14 122 lb 1.6 oz (55.384 kg)  06/22/14 125 lb 4.8 oz (56.836 kg)  06/08/14 126 lb 11.2 oz (57.471 kg)  06/05/14 130 lb 1.6 oz (59.013 kg)  05/31/14 128 lb (58.06 kg)    Usual Body Weight: 130 lb in 05/2014  % Usual Body Weight: 85%  BMI:  Body mass index is 21.99 kg/(m^2).  Estimated Nutritional Needs: Kcal:1600-1800 Protein: 90-100 gram Fluid: >/= 1600 ml daily  Skin: surgical incision on abd (port)  Diet Order: Diet NPO time specified Except for: Ice Chips  EDUCATION NEEDS: -No education needs identified at this time   Intake/Output Summary (Last 24 hours) at 09/17/14 1215 Last data filed at 09/17/14 1051  Gross per 24 hour  Intake 1991.75 ml  Output   1705 ml  Net 286.75 ml    Last BM: 12/26   Labs:   Recent Labs Lab 09/15/14  4827 09/16/14 0425 09/16/14 2000 09/17/14 0230  NA 134* 138  --  140  K 3.6 2.9*  --  3.1*  CL 112 118*  --  120*  CO2 15* 17*  --  17*  BUN 28* 34*  --  24*  CREATININE 0.73 0.96  --  0.69  CALCIUM 6.5* 7.2*  --  7.3*  MG  --  1.3* 2.6* 2.4  PHOS  --  1.5*  --   --   GLUCOSE 162* 163*  --  105*    CBG (last 3)   Recent Labs  09/15/14 0208  GLUCAP 127*    Scheduled Meds: . antiseptic oral rinse  7 mL Mouth Rinse q12n4p  . cefTRIAXone (ROCEPHIN)  IV  2 g Intravenous Q24H  . chlorhexidine  15 mL Mouth  Rinse BID  . hydrocortisone sodium succinate  50 mg Intravenous Q6H  . pantoprazole (PROTONIX) IV  40 mg Intravenous Q12H  . potassium chloride  10 mEq Intravenous Q1 Hr x 6    Continuous Infusions: . dextrose 5 % and 0.45% NaCl 75 mL/hr (09/17/14 1051)    Past Medical History  Diagnosis Date  . Arthritis   . Depression   . H/O: substance abuse   . Back pain   . Personal history of colonic polyp - adenoma 10/26/2013    10/26/2013 diminutive sigmoid polyp removed    . Pancreatitis, acute   . Hypertension   . Osteoporosis   . Radiation 04/30/14-06/08/14    pancreas 50 gray  . Cancer     pancreatic cancer    Past Surgical History  Procedure Laterality Date  . Lumbar epidural injection    . Colonoscopy    . Ercp N/A 03/06/2014    Procedure: ENDOSCOPIC RETROGRADE CHOLANGIOPANCREATOGRAPHY (ERCP);  Surgeon: Gatha Mayer, MD;  Location: [facility] ENDOSCOPY;  Service: Endoscopy;  Laterality: N/A;  . Eus N/A 04/05/2014    Procedure: UPPER ENDOSCOPIC ULTRASOUND (EUS) RADIAL;  Surgeon: [name], MD;  Location: [facility] ENDOSCOPY;  Service: Endoscopy;  Laterality: N/A;  . Whipple procedure N/A 08/07/2014    Procedure:  DIAGNOSTIC LAPAROSCOPY, WHIPPLE PROCEDURE;  Surgeon: Stark Klein, MD;  Location: [facility] ORS;  Service: General;  Laterality: N/A;    Atlee Abide MS RD LDN Clinical Dietitian [phone]

## 2014-09-18 DIAGNOSIS — B1920 Unspecified viral hepatitis C without hepatic coma: Secondary | ICD-10-CM

## 2014-09-18 LAB — COMPREHENSIVE METABOLIC PANEL
ALT: 16 U/L (ref 0–35)
AST: 37 U/L (ref 0–37)
Albumin: 1.4 g/dL — ABNORMAL LOW (ref 3.5–5.2)
Alkaline Phosphatase: 182 U/L — ABNORMAL HIGH (ref 39–117)
Anion gap: 5 (ref 5–15)
BUN: 20 mg/dL (ref 6–23)
CALCIUM: 7.6 mg/dL — AB (ref 8.4–10.5)
CO2: 17 mmol/L — ABNORMAL LOW (ref 19–32)
Chloride: 117 mEq/L — ABNORMAL HIGH (ref 96–112)
Creatinine, Ser: 0.51 mg/dL (ref 0.50–1.10)
GFR calc Af Amer: >90 mL/min (ref >90)
Glucose, Bld: 149 mg/dL — ABNORMAL HIGH (ref 70–99)
Potassium: 3.9 mmol/L (ref 3.5–5.1)
SODIUM: 139 mmol/L (ref 135–145)
Total Bilirubin: 1 mg/dL (ref 0.3–1.2)
Total Protein: 5.7 g/dL — ABNORMAL LOW (ref 6.0–8.3)

## 2014-09-18 LAB — MAGNESIUM: MAGNESIUM: 1.9 mg/dL (ref 1.5–2.5)

## 2014-09-18 LAB — CBC
HCT: 35 % — ABNORMAL LOW (ref 36.0–46.0)
Hemoglobin: 11.9 g/dL — ABNORMAL LOW (ref 12.0–15.0)
MCH: 28.9 pg (ref 26.0–34.0)
MCHC: 34 g/dL (ref 30.0–36.0)
MCV: 85 fL (ref 78.0–100.0)
PLATELETS: 23 10*3/uL — AB (ref 150–400)
RBC: 4.12 MIL/uL (ref 3.87–5.11)
RDW: 15.3 % (ref 11.5–15.5)
WBC: 11.7 10*3/uL — ABNORMAL HIGH (ref 4.0–10.5)

## 2014-09-18 LAB — CORTISOL: Cortisol, Plasma: 29 ug/dL

## 2014-09-18 LAB — PHOSPHORUS: Phosphorus: 4.9 mg/dL — ABNORMAL HIGH (ref 2.3–4.6)

## 2014-09-18 MED ORDER — POTASSIUM CHLORIDE 20 MEQ/15ML (10%) PO SOLN
40.0000 meq | Freq: Once | ORAL | Status: AC
  Administered 2014-09-18: 40 meq via ORAL
  Filled 2014-09-18: qty 30

## 2014-09-18 MED ORDER — FUROSEMIDE 10 MG/ML IJ SOLN
40.0000 mg | Freq: Once | INTRAMUSCULAR | Status: AC
  Administered 2014-09-18: 40 mg via INTRAVENOUS
  Filled 2014-09-18: qty 4

## 2014-09-18 NOTE — Progress Notes (Addendum)
IP PROGRESS NOTE  Subjective:   She denies abdominal pain. Some bleeding from the right nostril. No other bleeding.  Objective: Vital signs in last 24 hours: Blood pressure 121/79, pulse 58, temperature 98.4 F (36.9 C), temperature source Axillary, resp. rate 28, height 5\' 3"  (1.6 m), weight 124 lb 1.9 oz (56.3 kg), SpO2 100 %.  Intake/Output from previous day: 12/28 0701 - 12/29 0700 In: 2518.3 [I.V.:1868.3; IV Piggyback:650] Out: 840 [Urine:840]  Physical Exam:  HEENT: Oral cavity without bleeding  Abdomen: Soft, nontender, dry gauze at the midline incision Extremities: No leg edema Skin: No petechiae    Lab Results:  Recent Labs  09/17/14 2015 09/18/14 0520  WBC 11.8* 11.7*  HGB 11.8* 11.9*  HCT 33.9* 35.0*  PLT 23* 23*    BMET  Recent Labs  09/17/14 0230 09/18/14 0520  NA 140 139  K 3.1* 3.9  CL 120* 117*  CO2 17* 17*  GLUCOSE 105* 149*  BUN 24* 20  CREATININE 0.69 0.51  CALCIUM 7.3* 7.6*    Studies/Results: Ct Abdomen Pelvis W Contrast  09/16/2014   CLINICAL DATA:  Melena and fever, history of pancreatic carcinoma as following Whipple procedure, elevated white blood cell count  EXAM: CT ABDOMEN AND PELVIS WITH CONTRAST  TECHNIQUE: Multidetector CT imaging of the abdomen and pelvis was performed using the standard protocol following bolus administration of intravenous contrast.  CONTRAST:  31mL OMNIPAQUE IOHEXOL 300 MG/ML SOLN, 16mL OMNIPAQUE IOHEXOL 300 MG/ML SOLN  COMPARISON:  09/14/2014  FINDINGS: New moderate bilateral pleural effusions are identified with lower lobe infiltrate worse in the right base then the left particularly in the right middle lobe. These changes are new from the prior exam and may account for the patient's elevated white blood cell count.  The liver demonstrates diffuse irregular enhancement likely related this the timing of the contrast bolus as the delayed images show a relatively normal enhancement pattern. There is however  evidence of thrombus within the main portal vein just before its bifurcation into the right and left portal veins. It is not occlusive but occupies the majority of the lumen of the portal vein.  The spleen, adrenal glands and kidneys are normal in their CT appearance. No obstructive changes are noted.  Postoperative changes in the pancreas are seen with a pancreatic stent in place. Small amount of free fluid is noted within the abdomen which is new from the prior exam and likely related to the prior surgery. No definitive air-fluid collection to suggest abscess is identified.  The bladder is decompressed by Foley catheter. Diffuse anasarca is noted. The appendix is not well visualized although no inflammatory changes are seen. No pelvic mass lesion is seen. The osseous structures are within normal limits for the patient's given age.  IMPRESSION: Changes consistent with prior Whipple procedure with pancreatic stent in place. Some postoperative free fluid is noted although no organized collection to suggest abscess is noted at this time.  New main portal vein thrombus which is nonocclusive but occupies a majority of the lumen of the main portal vein. There is some differential enhancement of the liver identified which in part is likely related to this thrombus and timing of the contrast bolus.  New bilateral pleural effusions with bibasilar infiltrates particularly in the right middle lobe. This may be the etiology of the patient's elevated white blood cell count. These are new from the prior exam from 09/14/2014.  Changes of anasarca.  These results were called by telephone at the time  of interpretation on 09/16/2014 at 3:48 pm to Amy, the pts nurse, who verbally acknowledged these results.   Electronically Signed   By: Inez Catalina M.D.   On: 09/16/2014 15:50   Dg Chest Port 1 View  09/17/2014   CLINICAL DATA:  63 year old female with history of healthcare associated pneumonia presenting with shortness of breath  and chest pain.  EXAM: PORTABLE CHEST - 1 VIEW  COMPARISON:  Chest x-ray 09/14/2014.  FINDINGS: There is a right-sided internal jugular central venous catheter with tip terminating in the distal superior vena cava. Marked worsening of aeration, with widespread interstitial and airspace disease throughout the lungs bilaterally, most confluent in the right mid to lower lung, concerning for severe multilobar pneumonia. No definite pleural effusions. Pulmonary vasculature is obscured. Heart size is upper limits of normal. Atherosclerosis in the thoracic aorta.  IMPRESSION: 1. Interval development of widespread interstitial and airspace disease throughout the lungs bilaterally concerning for severe multilobar pneumonia.   Electronically Signed   By: Vinnie Langton M.D.   On: 09/17/2014 10:52    Medications: I have reviewed the patient's current medications.  Assessment/Plan:  1. 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  Pancreaticoduodenectomy 11/17/2015confirmed a pathologic ypT3,ypN1tumor with negative surgical margins 2. constipation-likely secondary to narcotic analgesics, improved  3. anorexia/weight loss  4. tobacco and alcohol use  5. history of a colon polyp in February 2015  6. depression 7. Pain secondary to pancreas cancer-improved 8. Klebsiella bacteremia 9. Respiratory failure/bilateral airspace disease 10. Acute portal vein thrombosis 11. Acute thrombocytopenia 12. GI bleeding 13. Hepatitis C positive  Melinda Conrad appears unchanged.  The hemoglobin has stabilized after transfusions. I discussed the case with Dr. Carlean Purl. It may be helpful to perform an upper endoscopy in an attempt to define a bleeding source. This may help with decision making regarding anticoagulation therapy.  Recommendations: 1. Management of respiratory failure per critical care medicine 2. Consider upper endoscopy per Dr. Carlean Purl 3. Hold anticoagulation therapy until the platelet count recovers 4. Decision on anticoagulation therapy for the acute portal vein thrombosis per Dr. Barry Dienes  Please call hematology as needed. I will be out starting 09/19/2014 until 09/26/2013.      LOS: 4 days   [place]  09/18/2014, 1:56 PM

## 2014-09-18 NOTE — Progress Notes (Signed)
Patient ID: Melinda Conrad, female   DOB: 27-Dec-1950, 63 y.o.   MRN: [id number] 3 Days Post-Op   Subjective: No pain today. No bleeding for 24 hours.    Objective: Vital signs in last 24 hours: Temp:  [97.6 F (36.4 C)-98.4 F (36.9 C)] 98.4 F (36.9 C) (12/29 0800) Pulse Rate:  [58-112] 87 (12/29 0800) Resp:  [26-39] 32 (12/29 0800) BP: (94-163)/(56-91) 129/85 mmHg (12/29 0800) SpO2:  [88 %-97 %] 93 % (12/29 0800) Weight:  [124 lb 1.9 oz (56.3 kg)] 124 lb 1.9 oz (56.3 kg) (12/29 0500) Last BM Date: 09/17/14   Intake/Output from previous day: 12/28 0701 - 12/29 0700 In: 2518.3 [I.V.:1868.3; IV Piggyback:650] Out: 840 [Urine:840] Intake/Output this shift: Total I/O In: 85 [I.V.:85] Out: 100 [Urine:100]  General appearance: alert, cooperative and no distress Resp: clear to auscultation bilaterally and anterior, HR better GI: soft, sore, no BS  Lab Results:   Recent Labs  09/17/14 2015 09/18/14 0520  WBC 11.8* 11.7*  HGB 11.8* 11.9*  HCT 33.9* 35.0*  PLT 23* 23*    BMET  Recent Labs  09/17/14 0230 09/18/14 0520  NA 140 139  K 3.1* 3.9  CL 120* 117*  CO2 17* 17*  GLUCOSE 105* 149*  BUN 24* 20  CREATININE 0.69 0.51  CALCIUM 7.3* 7.6*   PT/INR  Recent Labs  09/17/14 1000  LABPROT 17.2*  INR 1.39     Recent Labs Lab 09/14/14 1721 09/18/14 0520  AST 89* 37  ALT <5 16  ALKPHOS 401* 182*  BILITOT 1.5* 1.0  PROT 6.3 5.7*  ALBUMIN 1.8* 1.4*     Lipase     Component Value Date/Time   LIPASE <10* 09/14/2014 1721     Studies/Results: Ct Abdomen Pelvis W Contrast  09/16/2014   CLINICAL DATA:  Melena and fever, history of pancreatic carcinoma as following Whipple procedure, elevated white blood cell count  EXAM: CT ABDOMEN AND PELVIS WITH CONTRAST  TECHNIQUE: Multidetector CT imaging of the abdomen and pelvis was performed using the standard protocol following bolus administration of intravenous contrast.  CONTRAST:  23mL OMNIPAQUE IOHEXOL 300  MG/ML SOLN, 141mL OMNIPAQUE IOHEXOL 300 MG/ML SOLN  COMPARISON:  09/14/2014  FINDINGS: New moderate bilateral pleural effusions are identified with lower lobe infiltrate worse in the right base then the left particularly in the right middle lobe. These changes are new from the prior exam and may account for the patient's elevated white blood cell count.  The liver demonstrates diffuse irregular enhancement likely related this the timing of the contrast bolus as the delayed images show a relatively normal enhancement pattern. There is however evidence of thrombus within the main portal vein just before its bifurcation into the right and left portal veins. It is not occlusive but occupies the majority of the lumen of the portal vein.  The spleen, adrenal glands and kidneys are normal in their CT appearance. No obstructive changes are noted.  Postoperative changes in the pancreas are seen with a pancreatic stent in place. Small amount of free fluid is noted within the abdomen which is new from the prior exam and likely related to the prior surgery. No definitive air-fluid collection to suggest abscess is identified.  The bladder is decompressed by Foley catheter. Diffuse anasarca is noted. The appendix is not well visualized although no inflammatory changes are seen. No pelvic mass lesion is seen. The osseous structures are within normal limits for the patient's given age.  IMPRESSION: Changes consistent with prior  Whipple procedure with pancreatic stent in place. Some postoperative free fluid is noted although no organized collection to suggest abscess is noted at this time.  New main portal vein thrombus which is nonocclusive but occupies a majority of the lumen of the main portal vein. There is some differential enhancement of the liver identified which in part is likely related to this thrombus and timing of the contrast bolus.  New bilateral pleural effusions with bibasilar infiltrates particularly in the right  middle lobe. This may be the etiology of the patient's elevated white blood cell count. These are new from the prior exam from 09/14/2014.  Changes of anasarca.  These results were called by telephone at the time of interpretation on 09/16/2014 at 3:48 pm to Amy, the pts nurse, who verbally acknowledged these results.   Electronically Signed   By: Inez Catalina M.D.   On: 09/16/2014 15:50   Dg Chest Port 1 View  09/17/2014   CLINICAL DATA:  63 year old female with history of healthcare associated pneumonia presenting with shortness of breath and chest pain.  EXAM: PORTABLE CHEST - 1 VIEW  COMPARISON:  Chest x-ray 09/14/2014.  FINDINGS: There is a right-sided internal jugular central venous catheter with tip terminating in the distal superior vena cava. Marked worsening of aeration, with widespread interstitial and airspace disease throughout the lungs bilaterally, most confluent in the right mid to lower lung, concerning for severe multilobar pneumonia. No definite pleural effusions. Pulmonary vasculature is obscured. Heart size is upper limits of normal. Atherosclerosis in the thoracic aorta.  IMPRESSION: 1. Interval development of widespread interstitial and airspace disease throughout the lungs bilaterally concerning for severe multilobar pneumonia.   Electronically Signed   By: Vinnie Langton M.D.   On: 09/17/2014 10:52    Medications: . antiseptic oral rinse  7 mL Mouth Rinse q12n4p  . cefTRIAXone (ROCEPHIN)  IV  2 g Intravenous Q24H  . chlorhexidine  15 mL Mouth Rinse BID  . hydrocortisone sodium succinate  50 mg Intravenous Q6H  . pantoprazole (PROTONIX) IV  40 mg Intravenous Q12H   . dextrose 5 % and 0.45% NaCl 75 mL/hr (09/17/14 1051)    Assessment/Plan Sepsis with temperature up to 104/hypotendsion Hematemesis/acute GI bleed Bacteremia on blood cultures Klebsiella Portal vein Thrombosis Chronic Ibuprofen use for ongoing pain Anemia admit hemoglobin 5.8, improved after  transfusion. Stage IIa (T3N0) adenocarcinoma of the pancreatic head, s/p neoadjuvant chemoradiation; s/p Diagnostic laparoscopy, Classic pancreaticoduodenectomy,  Placement of pancreatic stent, 08/07/14, Dr. Barry Dienes (hospitalized 11/16-12/1/15)  Holding heparin until platelets rebound OK to start clears.      LOS: 4 days    [facility] 09/18/2014   [facility]  Office phone:  [phone]

## 2014-09-18 NOTE — Progress Notes (Signed)
Attempted to help patient to chair earlier in shift. After attempting to sit, patient's oxygen saturation dropped to the 70s with a good waveform on the monitor. Patient stated she felt very short of breath and did not want to get out of bed.  Patient monitored closely until oxygen saturation returned to normal. Will continue to monitor.

## 2014-09-18 NOTE — Progress Notes (Signed)
PULMONARY / CRITICAL CARE MEDICINE   Name: Melinda Conrad MRN: [id number] DOB: 10/26/1950    ADMISSION DATE:  09/14/2014  INITIAL PRESENTATION:  Admitted to PCCM service via ED with melena X several days, isolated fever on evening prior to admission (to 104.0 and treated with ibuprofen), episode of hematemesis on evening of presentation to ED for which EMS was dispatched. In [facility] ED, pt hypotensive and lethargic - both improved with IVFs and PRBCs. Recent diagnosis of pancreatic cancer, s/p Whipple procedure 08/07/14. Pt indicates that she uses ibuprofen fairly regularly.   SIGNIFICANT EVENTS/STUDIES:  12/25 CT abd/pelvis: Two ovoid high-density lesions adjacent to the pancreatic stent within the jejunum could represent hemorrhage or hematoma. No additional gross evidence of surgical complication on this non IV and oral contrast exam. Potential small additional clot within the stomach is indeterminate 12/25 Admitted with UGIB to ICU. 2 units PRBCs ordered 12/29: hemodynamically stable. Added lasix for ALI vs TRALI. Started clears   INDWELLING DEVICES:: R IJ CVL 12/25 >>  SUBJECTIVE: Mild increase in WOB   VITAL SIGNS: Temp:  [97.6 F (36.4 C)-98.4 F (36.9 C)] 98.4 F (36.9 C) (12/29 0800) Pulse Rate:  [58-112] 87 (12/29 0800) Resp:  [26-39] 32 (12/29 0800) BP: (94-163)/(56-91) 129/85 mmHg (12/29 0800) SpO2:  [88 %-97 %] 93 % (12/29 0800) Weight:  [56.3 kg (124 lb 1.9 oz)] 56.3 kg (124 lb 1.9 oz) (12/29 0500) HEMODYNAMICS: CVP:  [3 mmHg-8 mmHg] 3 mmHg VENTILATOR SETTINGS:   INTAKE / OUTPUT:  Intake/Output Summary (Last 24 hours) at 09/18/14 0925 Last data filed at 09/18/14 0800  Gross per 24 hour  Intake 2417.27 ml  Output    830 ml  Net 1587.27 ml   PHYSICAL EXAMINATION: General:  Cachectic, frail pleasant, mild increase in WOB Neuro: RASS 0, + F/C, cognition intact, MAEs HEENT: temporal wasting, otherwise WNL Cardiovascular: mildly tachy, few extrasystoles, no  M Lungs: basilar rales mild accessory muscle use  Abdomen: scaphoid, soft,mild tenderness peri-umbilical, +BS EXT: muscle wasting, cool, no edema, 1+ pulses  LABS:  CBC  Recent Labs Lab 09/17/14 1137 09/17/14 2015 09/18/14 0520  WBC 12.5* 11.8* 11.7*  HGB 11.7* 11.8* 11.9*  HCT 33.4* 33.9* 35.0*  PLT 25* 23* 23*   Coag's  Recent Labs Lab 09/15/14 0038 09/17/14 1000  APTT 36 48*  INR 1.52* 1.39   BMET  Recent Labs Lab 09/16/14 0425 09/17/14 0230 09/18/14 0520  NA 138 140 139  K 2.9* 3.1* 3.9  CL 118* 120* 117*  CO2 17* 17* 17*  BUN 34* 24* 20  CREATININE 0.96 0.69 0.51  GLUCOSE 163* 105* 149*   Electrolytes  Recent Labs Lab 09/16/14 0425 09/16/14 2000 09/17/14 0230 09/18/14 0520  CALCIUM 7.2*  --  7.3* 7.6*  MG 1.3* 2.6* 2.4 1.9  PHOS 1.5*  --   --  4.9*   Sepsis Markers  Recent Labs Lab 09/14/14 1731 09/14/14 2241  LATICACIDVEN 11.22* 3.7*   ABG No results for input(s): PHART, PCO2ART, PO2ART in the last 168 hours. Liver Enzymes  Recent Labs Lab 09/14/14 1721 09/18/14 0520  AST 89* 37  ALT <5 16  ALKPHOS 401* 182*  BILITOT 1.5* 1.0  ALBUMIN 1.8* 1.4*   Cardiac Enzymes No results for input(s): TROPONINI, PROBNP in the last 168 hours. Glucose  Recent Labs Lab 09/15/14 0208  GLUCAP 127*   CXR:   ASSESSMENT / PLAN:  PULMONARY A: Hypoxia.  Now desats w/ O2. ? TRALI (got 4 units PRBC) vs ALI  from bacteremia  P:   - Supp O2 as needed - Add lasix - F/u cxr 12/30  CARDIOVASCULAR A:  Hemorrhagic shock -lactate cleared Off pressors P:  - Cont tele - Dc steroids   RENAL A:   AKI - likely shock +/- NSAIDS -resolved NAG metabolic acidosis -->stable  Hypokalemia/hypomag P:   - Monitor BMET intermittently - Monitor I/Os - Correct electrolytes as indicated - Trend chem   GASTROINTESTINAL A:   UGIB - likely gastric or duodenal ulcer. R/O varices, also consider ischemia related bleeding Pancreatic cancer - recent  Whipple 08/07/14, pancreatic stents Portal vein Thrombosis -->not candidate for heparin d/t life threatening bleed and severe thrombocytosis Severe protein-calorie malnutrition P:   - Double dose PPI. - Add clears, surgery would have low threshold for TNA if diet not tolerated   HEMATOLOGIC A:   Acute blood loss anemia-->hgb stable Anemia of chronic illness Thrombocytopenia -->likely septic coagulopathy ->stabalized  F/b heme/onc  P:  - D/C'd heparin, but would hope to resume when platelets rebound  - Monitor CBC intermittently - Transfuse for hemorrhagic shock as needed  - Transfuse plts if bleed recurs  INFECTIOUS A: Klebsiella bacteremia ? Source possibly GI translocation  P:   MICRO DATA: Blood 12/25 >> Klebsiella (pan sens)  ANTIMICROBIALS:  Ceftriaxone 12/25 >> 12/26 Zosyn 12/26 >>12/28 Rocephin 12/28>>>  ENDOCRINE A:   No issues P:   - Monitor glu on chem panels  NEUROLOGIC A:   Post surgical and cancer related pain Very poor functional status Deconditioning  P:   - RASS goal: 0 - PRN fentanyl - Palliative Care consultation requested 12/25 for goals of care - Add PT consult   NP summary No distress. hgb stable, shock and organ failure resolved. GI following as is surgery. Source of bleeding unclear . Looks like mild ALI vs TRALI. BP is stable for diuresis. Will give lasix, try to mobilize and add clear liquids. Appreciate surgical and Heme/onc assist.   Erick Colace ACNP-BC [place] Pager # [phone] OR # [phone] if no answer  Will start diuresing now that BP is improved, appreciate input from GI and surgery.  Begin clear liquid diet and begin to mobilize.  Observe closely for signs of mild ALI as O2 demand still remains relatively high.    Patient seen and examined, agree with above note.  I dictated the care and orders written for this patient under my direction.  Rush Farmer, MD [phone]  09/18/2014, 9:25 AM

## 2014-09-18 NOTE — Progress Notes (Signed)
    Progress Note   Subjective  abdominal pain earlier this am. Feels okay at present. No further GI bleeding. No vomiting.   Objective   Vital signs in last 24 hours: Temp:  [97.6 F (36.4 C)-98.4 F (36.9 C)] 98.4 F (36.9 C) (12/29 0800) Pulse Rate:  [58-112] 87 (12/29 0800) Resp:  [26-39] 32 (12/29 0800) BP: (94-163)/(56-91) 129/85 mmHg (12/29 0800) SpO2:  [88 %-97 %] 93 % (12/29 0800) Weight:  [124 lb 1.9 oz (56.3 kg)] 124 lb 1.9 oz (56.3 kg) (12/29 0500) Last BM Date: 09/17/14 General:    Pleasant black female in NAD Heart:  Regular rate and rhythm; no murmurs Lungs: Respirations even and unlabored, lungs CTA bilaterally Abdomen:  Soft, nontender and nondistended. Normal bowel sounds. Extremities:  Without edema. Neurologic:  Alert and oriented,  grossly normal neurologically. Psych:  Cooperative. Normal mood and affect. Lab Results:  Recent Labs  09/17/14 1137 09/17/14 2015 09/18/14 0520  WBC 12.5* 11.8* 11.7*  HGB 11.7* 11.8* 11.9*  HCT 33.4* 33.9* 35.0*  PLT 25* 23* 23*   BMET  Recent Labs  09/16/14 0425 09/17/14 0230 09/18/14 0520  NA 138 140 139  K 2.9* 3.1* 3.9  CL 118* 120* 117*  CO2 17* 17* 17*  GLUCOSE 163* 105* 149*  BUN 34* 24* 20  CREATININE 0.96 0.69 0.51  CALCIUM 7.2* 7.3* 7.6*   LFT  Recent Labs  09/18/14 0520  PROT 5.7*  ALBUMIN 1.4*  AST 37  ALT 16  ALKPHOS 182*  BILITOT 1.0     Assessment / Plan:   40. 62 year old female with history of pancreatic adenocarcinoma, s/p chemoradiation followed by whipple approximately one month ago. Currently admitted with sepsis (Klebsiella bacteremia) / melena / hematemesis in setting of NSAID. EGD has been on hold secondary to hypotension. On BID IV PPI. No further bleeding during the night or this am per RN.    2. PVT. Heparin discontinued yesterday morning because of severe thrombocytopenia related to sepsis.Marland Kitchen  3. Anemia of acute blood loss. Hgb stable at 11.9 after several units of  blood ( last transfusion evening of the 26th)    LOS: 4 days   Tye Savoy  09/18/2014, 9:04 AM   [facility] Attending  I have also seen and assessed the patient and agree with the above note. Waiting for her to recover more from sepsis. Will be available but will not round on her daily - when/if we think EGD needed please call back.  Gatha Mayer, MD, [facility] [phone] (pager) 09/18/2014 6:38 PM

## 2014-09-19 ENCOUNTER — Inpatient Hospital Stay (HOSPITAL_COMMUNITY): Payer: Medicaid Other

## 2014-09-19 DIAGNOSIS — Z515 Encounter for palliative care: Secondary | ICD-10-CM

## 2014-09-19 LAB — CBC
HCT: 32.1 % — ABNORMAL LOW (ref 36.0–46.0)
Hemoglobin: 11 g/dL — ABNORMAL LOW (ref 12.0–15.0)
MCH: 29 pg (ref 26.0–34.0)
MCHC: 34.3 g/dL (ref 30.0–36.0)
MCV: 84.7 fL (ref 78.0–100.0)
PLATELETS: 41 10*3/uL — AB (ref 150–400)
RBC: 3.79 MIL/uL — ABNORMAL LOW (ref 3.87–5.11)
RDW: 15.3 % (ref 11.5–15.5)
WBC: 12.1 10*3/uL — AB (ref 4.0–10.5)

## 2014-09-19 LAB — COMPREHENSIVE METABOLIC PANEL
ALBUMIN: 1.4 g/dL — AB (ref 3.5–5.2)
ALK PHOS: 170 U/L — AB (ref 39–117)
ALT: 14 U/L (ref 0–35)
AST: 36 U/L (ref 0–37)
Anion gap: 3 — ABNORMAL LOW (ref 5–15)
BUN: 20 mg/dL (ref 6–23)
CO2: 19 mmol/L (ref 19–32)
Calcium: 7.5 mg/dL — ABNORMAL LOW (ref 8.4–10.5)
Chloride: 116 mEq/L — ABNORMAL HIGH (ref 96–112)
Creatinine, Ser: 0.58 mg/dL (ref 0.50–1.10)
GFR calc Af Amer: >90 mL/min (ref >90)
GFR calc non Af Amer: >90 mL/min (ref >90)
Glucose, Bld: 115 mg/dL — ABNORMAL HIGH (ref 70–99)
POTASSIUM: 3.7 mmol/L (ref 3.5–5.1)
Sodium: 138 mmol/L (ref 135–145)
TOTAL PROTEIN: 5.9 g/dL — AB (ref 6.0–8.3)
Total Bilirubin: 0.8 mg/dL (ref 0.3–1.2)

## 2014-09-19 MED ORDER — FENTANYL CITRATE 0.05 MG/ML IJ SOLN
50.0000 ug | INTRAMUSCULAR | Status: DC | PRN
  Administered 2014-09-19 – 2014-09-26 (×26): 50 ug via INTRAVENOUS
  Filled 2014-09-19 (×26): qty 2

## 2014-09-19 MED ORDER — ENSURE COMPLETE PO LIQD
237.0000 mL | Freq: Three times a day (TID) | ORAL | Status: DC
  Administered 2014-09-19 – 2014-10-09 (×28): 237 mL via ORAL

## 2014-09-19 MED ORDER — FENTANYL 25 MCG/HR TD PT72
25.0000 ug | MEDICATED_PATCH | TRANSDERMAL | Status: DC
  Administered 2014-09-19 – 2014-10-07 (×7): 25 ug via TRANSDERMAL
  Filled 2014-09-19 (×7): qty 1

## 2014-09-19 MED ORDER — POTASSIUM CHLORIDE 20 MEQ/15ML (10%) PO SOLN
40.0000 meq | Freq: Once | ORAL | Status: AC
  Administered 2014-09-19: 40 meq via ORAL
  Filled 2014-09-19: qty 30

## 2014-09-19 MED ORDER — FENTANYL CITRATE 0.05 MG/ML IJ SOLN
18.7500 ug | INTRAMUSCULAR | Status: DC | PRN
  Administered 2014-09-19: 75 ug via INTRAVENOUS
  Filled 2014-09-19: qty 2

## 2014-09-19 MED ORDER — OXYCODONE HCL 5 MG PO TABS
5.0000 mg | ORAL_TABLET | Freq: Four times a day (QID) | ORAL | Status: DC | PRN
  Administered 2014-09-19: 5 mg via ORAL
  Filled 2014-09-19: qty 1

## 2014-09-19 MED ORDER — FUROSEMIDE 10 MG/ML IJ SOLN
40.0000 mg | Freq: Once | INTRAMUSCULAR | Status: AC
  Administered 2014-09-19: 40 mg via INTRAVENOUS
  Filled 2014-09-19: qty 4

## 2014-09-19 MED ORDER — PREGABALIN 25 MG PO CAPS
25.0000 mg | ORAL_CAPSULE | Freq: Every day | ORAL | Status: DC
  Administered 2014-09-19 – 2014-10-08 (×20): 25 mg via ORAL
  Filled 2014-09-19 (×20): qty 1

## 2014-09-19 MED ORDER — LIDOCAINE 5 % EX PTCH
1.0000 | MEDICATED_PATCH | CUTANEOUS | Status: DC
  Administered 2014-09-19 – 2014-10-08 (×14): 1 via TRANSDERMAL
  Filled 2014-09-19 (×21): qty 1

## 2014-09-19 NOTE — Progress Notes (Signed)
4 Days Post-Op  Subjective: She looks tired and worn out falling asleep with cup of ice in her hands.  No new changes noted.  Objective: Vital signs in last 24 hours: Temp:  [97.3 F (36.3 C)-98.1 F (36.7 C)] 97.3 F (36.3 C) (12/30 0400) Pulse Rate:  [53-91] 77 (12/30 0600) Resp:  [24-33] 24 (12/30 0600) BP: (111-161)/(68-95) 137/81 mmHg (12/30 0600) SpO2:  [91 %-100 %] 95 % (12/30 0600) Last BM Date: 09/17/14 850 PO +BM x 1 Afebrile, VSS CMP Ok Platelets up to 41 K No films Intake/Output from previous day: 12/29 0701 - 12/30 0700 In: 2351.3 [P.O.:850; I.V.:1451.3; IV Piggyback:50] Out: 1900 [Urine:1900] Intake/Output this shift:    General appearance: alert, cooperative and no distress GI: soft still a little sore, open wound OK.  few BS.  Lab Results:   Recent Labs  09/18/14 0520 09/19/14 0500  WBC 11.7* 12.1*  HGB 11.9* 11.0*  HCT 35.0* 32.1*  PLT 23* 41*    BMET  Recent Labs  09/18/14 0520 09/19/14 0500  NA 139 138  K 3.9 3.7  CL 117* 116*  CO2 17* 19  GLUCOSE 149* 115*  BUN 20 20  CREATININE 0.51 0.58  CALCIUM 7.6* 7.5*   PT/INR  Recent Labs  09/17/14 1000  LABPROT 17.2*  INR 1.39     Recent Labs Lab 09/14/14 1721 09/18/14 0520 09/19/14 0500  AST 89* 37 36  ALT 5 16 14   ALKPHOS 401* 182* 170*  BILITOT 1.5* 1.0 0.8  PROT 6.3 5.7* 5.9*  ALBUMIN 1.8* 1.4* 1.4*     Lipase     Component Value Date/Time   LIPASE <10* 09/14/2014 1721     Studies/Results: Dg Chest Port 1 View  09/17/2014   CLINICAL DATA:  63 year old female with history of healthcare associated pneumonia presenting with shortness of breath and chest pain.  EXAM: PORTABLE CHEST - 1 VIEW  COMPARISON:  Chest x-ray 09/14/2014.  FINDINGS: There is a right-sided internal jugular central venous catheter with tip terminating in the distal superior vena cava. Marked worsening of aeration, with widespread interstitial and airspace disease throughout the lungs  bilaterally, most confluent in the right mid to lower lung, concerning for severe multilobar pneumonia. No definite pleural effusions. Pulmonary vasculature is obscured. Heart size is upper limits of normal. Atherosclerosis in the thoracic aorta.  IMPRESSION: 1. Interval development of widespread interstitial and airspace disease throughout the lungs bilaterally concerning for severe multilobar pneumonia.   Electronically Signed   By: Vinnie Langton M.D.   On: 09/17/2014 10:52    Medications: . antiseptic oral rinse  7 mL Mouth Rinse q12n4p  . cefTRIAXone (ROCEPHIN)  IV  2 g Intravenous Q24H  . chlorhexidine  15 mL Mouth Rinse BID  . pantoprazole (PROTONIX) IV  40 mg Intravenous Q12H    Assessment/Plan Sepsis with temperature up to 104/hypotendsion Hematemesis/acute GI bleed Thrombocytopenia Bacteremia on blood cultures Klebsiella Portal vein Thrombosis Chronic Ibuprofen use for ongoing pain Anemia admit hemoglobin 5.8, improved after transfusion. 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 Barry Dienes (hospitalized 11/16-12/1/15   Plan:  She seems to be doing well on clears, heparin on hold awaiting platelet recovery.      LOS: 5 days    Lyle Leisner 09/19/2014

## 2014-09-19 NOTE — Evaluation (Signed)
Physical Therapy Evaluation Patient Details Name: Melinda Conrad MRN: [id number] DOB: 24-Nov-1950 Today's Date: 09/19/2014   History of Present Illness  63 yo female adm 09/14/14 with GIB, hemorrhagic shock, Hgb 5.8 on admission; PMHx:  s/p whipple on 08/07/14, depression, hx substance abuse   Clinical Impression  Pt admitted with above diagnosis. Pt currently with functional limitations due to the deficits listed below (see PT Problem List).  Pt will benefit from skilled PT to increase their independence and safety with mobility to allow discharge to the venue listed below.  Pt very deconditioned,  Wants to go home with dtr if at all possible. Will continue to follow     Follow Up Recommendations Home health PT;Supervision/Assistance - 24 hour    Equipment Recommendations  Rolling walker with 5" wheels (? TBD)    Recommendations for Other Services       Precautions / Restrictions Precautions Precautions: Fall      Mobility  Bed Mobility Overal bed mobility: Needs Assistance Bed Mobility: Rolling Rolling: Min assist         General bed mobility comments: bed flat, used hand rail, cues for participation; pt able to hold s/l position without assist  Transfers                    Ambulation/Gait                Stairs            Wheelchair Mobility    Modified Rankin (Stroke Patients Only)       Balance                                             Pertinent Vitals/Pain Pain Assessment: 0-10 Pain Score: 8  Pain Location: diffuse Pain Intervention(s): Monitored during session;Patient requesting pain meds-RN notified;Limited activity within patient's tolerance    Home Living Family/patient expects to be discharged to:: Private residence Living Arrangements: Children   Type of Home: House (dtr's house) Home Access: Stairs to enter     [place]: One level [place]: Kasandra Knudsen - single point      Prior Function  Level of Independence: Independent with assistive device(s)         Comments: uses cane when going out/to church/longer distances     Hand Dominance        Extremity/Trunk Assessment   Upper Extremity Assessment: Generalized weakness;RUE deficits/detail;LUE deficits/detail RUE Deficits / Details: grossly 3/5     LUE Deficits / Details: grossly 3+/5   Lower Extremity Assessment: Generalized weakness;RLE deficits/detail;LLE deficits/detail RLE Deficits / Details: grossly 3+/5 bil       Communication   Communication: No difficulties  Cognition Arousal/Alertness: Awake/alert Behavior During Therapy: WFL for tasks assessed/performed Overall Cognitive Status: Within Functional Limits for tasks assessed                      General Comments      Exercises        Assessment/Plan    PT Assessment Patient needs continued PT services  PT Diagnosis Generalized weakness   PT Problem List Decreased strength;Decreased activity tolerance;Decreased balance;Decreased mobility;Pain  PT Treatment Interventions DME instruction;Gait training;Functional mobility training;Therapeutic activities;Patient/family education;Balance training;Therapeutic exercise;Stair training   PT Goals (Current goals can be found in the Care Plan section) Acute Rehab PT Goals Patient Stated  Goal: pt wants to be able to move, wants pain to be better; be able to go home with dtr PT Goal Formulation: With patient Time For Goal Achievement: 10/03/14    Frequency Min 3X/week   Barriers to discharge        Co-evaluation               End of Session   Activity Tolerance: Patient limited by fatigue;Patient limited by pain Patient left: in bed;with call bell/phone within reach Nurse Communication: Mobility status         Time: 1205-1221 PT Time Calculation (min) (ACUTE ONLY): 16 min   Charges:   PT Evaluation $Initial PT Evaluation Tier I: 1 Procedure PT Treatments $Therapeutic  Activity: 8-22 mins   PT G Codes:        Elad Macphail 2014-09-25, 12:55 PM

## 2014-09-19 NOTE — Consult Note (Signed)
Palliative Medicine Team  Consult Note Reason: Symptom Management (Pain), Goals of Care  Requested by: CCM  Problem List: Patient Active Problem List   Diagnosis Date Noted  . 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 Sepsis   . Thrombocytopenia 09/16/2014  . Septic shock   . Acute GI bleeding 09/14/2014  . Hemorrhagic shock 09/14/2014  . Protein-calorie malnutrition, severe 08/08/2014  . Pancreatic cancer s/p Whipple 07/2014 03/04/2014  . Depression 03/04/2014  . Personal history of colonic polyp - adenoma 10/26/2013    Assessment: Palliative medicine consult received and patient needs identified to be primarily symptom management related to persistent abdominal pain followed by a need for a discussion related to her goals of care and advance directives. Melinda Conrad currently endorses 10/10 pain, she is quite lethargic and unable to carry on a full conversation due to obvious signs of distress. It is difficult to know what her baseline is and how she was post-Whipple procedure which can cause chronic abdominal pain alone but she also has a portal vein thrombus which can present as more acute and progressive abdominal pain-reviewed CT done on 12/27. No obvious evidence of intestinal infarction from the PVT and it is not occlusive but may be a source of her pain and varices the source of her bleeding. Discussed her pain with RN-patient has not been free of pain since admission and has been receiving ATC Fentanyl pushes. While her pancreas margins were clean she had perineural invasion and a positive lymph node making her very high risk for recurrent disease. ?is portal thrombosis has ?invasion of the portal vein tumor thrombus??. She is scheduled for her first round of chemotherapy on 1/7 but is currently hospitalized with a very poor functional status. She has a prior history of chronic pain and ETOH abuse.  Recommendations:  1. Full Code- did not address today-patient unable to participate in a  detailed or complex discussion  2. Pain:  Treat PVT aggressively- anticoagulation, antibiotics(risk for septic thrombus), IR evaluation for PV stent for pain control primarily and any other minimally invasive treatment option especially since anticoagulation is very risky but necessary. Her hb has stabilized.  Medication:  She has received 44mcg of fentanyl almost every 3 hours for the past 3 days- this was stopped at around 9AM this morning and prn oxycodone 5mg  was ordered every 6 hours PRN instead-she is now having significant pain and has history of opiate use so she probably has a higher than average tolerance.   Start: Fentanyl 41mcg Patch, use PRN IV fentanyl to bridge her until patch takes effect in about 12-24 hours.   Add PM dose of Lyrica 25mg  for neuropathic pain component qhs  3. Start Stimulant Laxative and Stool Softner  PMT will follow for SYMPTOM MANAGEMENT- depending on her needs we are happy to host a conversation about goals of care- will see how she does over the next day or two.   Filed Vitals:   09/19/14 1600  BP: 147/96  Pulse: 79  Temp:   Resp: 34   Exam: Lethargic, cooperative Abdomen is tender-epigastric, guarding +anasarca Shallow respirations  Scheduled Meds: . antiseptic oral rinse  7 mL Mouth Rinse q12n4p  . cefTRIAXone (ROCEPHIN)  IV  2 g Intravenous Q24H  . chlorhexidine  15 mL Mouth Rinse BID  . feeding supplement (ENSURE COMPLETE)  237 mL Oral TID BM  . pantoprazole (PROTONIX) IV  40 mg Intravenous Q12H   Continuous Infusions: . dextrose 5 % and 0.45% NaCl 10 mL/hr  at 09/19/14 1027   PRN Meds:.acetaminophen, metoCLOPramide (REGLAN) injection, metoprolol, ondansetron (ZOFRAN) IV **OR** ondansetron (ZOFRAN) IV, oxyCODONE   Past Medical History  Diagnosis Date  . Arthritis   . Depression   . H/O: substance abuse   . Back pain   . Personal history of colonic polyp - adenoma 10/26/2013    10/26/2013 diminutive sigmoid polyp removed    .  Pancreatitis, acute   . Hypertension   . Osteoporosis   . Radiation 04/30/14-06/08/14    pancreas 50 gray  . Cancer     pancreatic cancer   Reviewed social history and family history.  No advance directive or HCPOA.  Time: 70 minutes 4:30-5:40PM Greater than 50%  of this time was spent counseling and coordinating care related to the above assessment and plan.   Lane Hacker, DO Palliative Medicine [phone]

## 2014-09-19 NOTE — Progress Notes (Signed)
Nutrition Note  Received consult for assessment. Initial assessment completed on 12/28 when pt was NPO, please refer to for further details.  Diet advanced to Regular on 12/30. Pt reported tolerating clear liquid breakfast and was eager for solid foods. Was agreeable for Ensure Complete 2-3 times daily. Provided pt with supplement sample.   Will continue to monitor per protocols. Please re-consult as needed. Atlee Abide MS RD LDN Clinical Dietitian [phone]

## 2014-09-19 NOTE — Progress Notes (Signed)
PULMONARY / CRITICAL CARE MEDICINE   Name: Melinda Conrad MRN: [id number] DOB: [birth year]    ADMISSION DATE:  09/14/2014  INITIAL PRESENTATION:  Admitted to PCCM service via ED with melena X several days, isolated fever on evening prior to admission (to 104.0 and treated with ibuprofen), episode of hematemesis on evening of presentation to ED for which EMS was dispatched. In [facility] ED, pt hypotensive and lethargic - both improved with IVFs and PRBCs. Recent diagnosis of pancreatic cancer, s/p Whipple procedure 08/07/14. Pt indicates that she uses ibuprofen fairly regularly.   SIGNIFICANT EVENTS/STUDIES:  12/25 CT abd/pelvis: Two ovoid high-density lesions adjacent to the pancreatic stent within the jejunum could represent hemorrhage or hematoma. No additional gross evidence of surgical complication on this non IV and oral contrast exam. Potential small additional clot within the stomach is indeterminate 12/25 Admitted with UGIB to ICU. 2 units PRBCs ordered 12/29: hemodynamically stable. Added lasix for ALI vs TRALI. Started clears   INDWELLING DEVICES:: R IJ CVL 12/25 >>   SUBJECTIVE: WOB improved    VITAL SIGNS: Temp:  [97.3 F (36.3 C)-98.1 F (36.7 C)] 97.3 F (36.3 C) (12/30 0400) Pulse Rate:  [58-91] 66 (12/30 0900) Resp:  [24-35] 35 (12/30 0900) BP: (111-161)/(68-95) 127/89 mmHg (12/30 0800) SpO2:  [85 %-100 %] 93 % (12/30 0900) 4 liters HEMODYNAMICS: CVP:  [3 mmHg-4 mmHg] 3 mmHg VENTILATOR SETTINGS:   INTAKE / OUTPUT:  Intake/Output Summary (Last 24 hours) at 09/19/14 0916 Last data filed at 09/19/14 0900  Gross per 24 hour  Intake 2421.25 ml  Output   1950 ml  Net 471.25 ml   PHYSICAL EXAMINATION: General:  Cachectic, frail pleasant, WOB improved Neuro: RASS 0, + F/C, cognition intact, MAEs HEENT: temporal wasting, otherwise WNL Cardiovascular: mildly tachy, few extrasystoles, no M Lungs: basilar rales accessory muscle use improved Abdomen: scaphoid,  soft,mild tenderness peri-umbilical, +BS EXT: muscle wasting, cool, no edema, 1+ pulses  LABS:  CBC  Recent Labs Lab 09/17/14 2015 09/18/14 0520 09/19/14 0500  WBC 11.8* 11.7* 12.1*  HGB 11.8* 11.9* 11.0*  HCT 33.9* 35.0* 32.1*  PLT 23* 23* 41*   Coag's  Recent Labs Lab 09/15/14 0038 09/17/14 1000  APTT 36 48*  INR 1.52* 1.39   BMET  Recent Labs Lab 09/17/14 0230 09/18/14 0520 09/19/14 0500  NA 140 139 138  K 3.1* 3.9 3.7  CL 120* 117* 116*  CO2 17* 17* 19  BUN 24* 20 20  CREATININE 0.69 0.51 0.58  GLUCOSE 105* 149* 115*   Electrolytes  Recent Labs Lab 09/16/14 0425 09/16/14 2000 09/17/14 0230 09/18/14 0520 09/19/14 0500  CALCIUM 7.2*  --  7.3* 7.6* 7.5*  MG 1.3* 2.6* 2.4 1.9  --   PHOS 1.5*  --   --  4.9*  --    Sepsis Markers  Recent Labs Lab 09/14/14 1731 09/14/14 2241  LATICACIDVEN 11.22* 3.7*   ABG No results for input(s): PHART, PCO2ART, PO2ART in the last 168 hours. Liver Enzymes  Recent Labs Lab 09/14/14 1721 09/18/14 0520 09/19/14 0500  AST 89* 37 36  ALT 5 16 14   ALKPHOS 401* 182* 170*  BILITOT 1.5* 1.0 0.8  ALBUMIN 1.8* 1.4* 1.4*   Cardiac Enzymes No results for input(s): TROPONINI, PROBNP in the last 168 hours. Glucose  Recent Labs Lab 09/15/14 0208  GLUCAP 127*   CXR:   ASSESSMENT / PLAN:  PULMONARY A: Hypoxia.  Now desats w/ O2. ? TRALI (got 4 units PRBC) vs ALI from bacteremia  P:   - Supp O2 as needed. - Cont lasix as BUN/creatinine allow.  - Mobilize.   CARDIOVASCULAR A:  Hemorrhagic shock -lactate cleared Off pressors P:  - D/c tele   RENAL A:   AKI - likely shock +/- NSAIDS -resolved NAG metabolic acidosis -->stable/ improving   P:   - Monitor BMET intermittently - Monitor I/Os - Correct electrolytes as indicated - Trend chem   GASTROINTESTINAL A:   UGIB - likely gastric or duodenal ulcer. R/O varices, also consider ischemia related bleeding Pancreatic cancer - recent Whipple  08/07/14, pancreatic stents Portal vein Thrombosis -->not candidate for heparin d/t life threatening bleed and severe thrombocytosis Severe protein-calorie malnutrition P:   - Double dose PPI. - Advance diet   - RD consult   HEMATOLOGIC A:   Acute blood loss anemia-->hgb stable Anemia of chronic illness Thrombocytopenia -->likely septic coagulopathy ->stabalized/ improved F/b heme/onc  P:  - D/C'd heparin, but would hope to resume when platelets rebound  - Monitor CBC intermittently - Transfuse for hemorrhagic shock as needed  - Transfuse plts if bleed recurs  INFECTIOUS A: Klebsiella bacteremia ? Source possibly GI translocation  P:   MICRO DATA: Blood 12/25 >> Klebsiella (pan sens) Repeat cultures 12/30>>> ANTIMICROBIALS:  Ceftriaxone 12/25 >> 12/26 Zosyn 12/26 >>12/28 Rocephin 12/28>>>  ENDOCRINE A:   No issues P:   - Monitor glu on chem panels  NEUROLOGIC A:   Post surgical and cancer related pain Very poor functional status Deconditioning  P:   - RASS goal: 0 - PRN analgesia  - Palliative Care consultation requested 12/25 for goals of care - Added PT consult   NP summary No distress. hgb stable, shock and organ failure resolved. GI following as is surgery. Source of bleeding unclear; but hgb stable.  Looks like mild ALI vs TRALI. Tolerating diuresis and looking better. Will repeat lasix today. F/u CXR. She is stable for med/surg w/ pulse ox. [facility] to assume care 12/30  Erick Colace ACNP-BC [place] Pager # [phone] OR # [phone] if no answer  Off pressors, doing well, oxygenation improving, continue diureses, transfer to med/surg and to [facility] 12/30, PCCM will sign off.  Patient seen and examined, agree with above note.  I dictated the care and orders written for this patient under my direction.  Rush Farmer, MD [phone]  09/19/2014, 9:16 AM

## 2014-09-20 DIAGNOSIS — J9601 Acute respiratory failure with hypoxia: Secondary | ICD-10-CM

## 2014-09-20 LAB — BASIC METABOLIC PANEL
Anion gap: 6 (ref 5–15)
BUN: 16 mg/dL (ref 6–23)
CALCIUM: 7.4 mg/dL — AB (ref 8.4–10.5)
CHLORIDE: 113 meq/L — AB (ref 96–112)
CO2: 20 mmol/L (ref 19–32)
Creatinine, Ser: 0.5 mg/dL (ref 0.50–1.10)
GFR calc Af Amer: >90 mL/min (ref >90)
GFR calc non Af Amer: >90 mL/min (ref >90)
GLUCOSE: 74 mg/dL (ref 70–99)
Potassium: 3 mmol/L — ABNORMAL LOW (ref 3.5–5.1)
SODIUM: 139 mmol/L (ref 135–145)

## 2014-09-20 LAB — URINE MICROSCOPIC-ADD ON

## 2014-09-20 LAB — URINALYSIS, ROUTINE W REFLEX MICROSCOPIC
BILIRUBIN URINE: NEGATIVE
Glucose, UA: NEGATIVE mg/dL
Hgb urine dipstick: NEGATIVE
Ketones, ur: NEGATIVE mg/dL
NITRITE: NEGATIVE
PH: 6 (ref 5.0–8.0)
PROTEIN: 30 mg/dL — AB
Specific Gravity, Urine: 1.017 (ref 1.005–1.030)
UROBILINOGEN UA: 0.2 mg/dL (ref 0.0–1.0)

## 2014-09-20 LAB — CBC
HEMATOCRIT: 32.2 % — AB (ref 36.0–46.0)
HEMOGLOBIN: 10.8 g/dL — AB (ref 12.0–15.0)
MCH: 29 pg (ref 26.0–34.0)
MCHC: 33.5 g/dL (ref 30.0–36.0)
MCV: 86.3 fL (ref 78.0–100.0)
Platelets: 63 10*3/uL — ABNORMAL LOW (ref 150–400)
RBC: 3.73 MIL/uL — ABNORMAL LOW (ref 3.87–5.11)
RDW: 15.4 % (ref 11.5–15.5)
WBC: 10.1 10*3/uL (ref 4.0–10.5)

## 2014-09-20 LAB — BRAIN NATRIURETIC PEPTIDE: B Natriuretic Peptide: 945.5 pg/mL — ABNORMAL HIGH (ref 0.0–100.0)

## 2014-09-20 MED ORDER — FUROSEMIDE 10 MG/ML IJ SOLN
40.0000 mg | Freq: Once | INTRAMUSCULAR | Status: AC
  Administered 2014-09-20: 40 mg via INTRAVENOUS
  Filled 2014-09-20: qty 4

## 2014-09-20 MED ORDER — SODIUM CHLORIDE 0.9 % IJ SOLN
10.0000 mL | INTRAMUSCULAR | Status: DC | PRN
  Administered 2014-09-20: 10 mL
  Administered 2014-09-21: 20 mL
  Administered 2014-09-25 (×2): 10 mL
  Administered 2014-09-26: 20 mL
  Administered 2014-09-26 (×2): 10 mL
  Filled 2014-09-20 (×6): qty 40

## 2014-09-20 MED ORDER — SODIUM CHLORIDE 0.9 % IJ SOLN
10.0000 mL | Freq: Two times a day (BID) | INTRAMUSCULAR | Status: DC
  Administered 2014-09-21 – 2014-09-28 (×5): 10 mL
  Administered 2014-09-29: 30 mL
  Administered 2014-09-29: 23 mL
  Administered 2014-09-30: 10 mL

## 2014-09-20 MED ORDER — POTASSIUM CHLORIDE CRYS ER 20 MEQ PO TBCR
40.0000 meq | EXTENDED_RELEASE_TABLET | Freq: Once | ORAL | Status: AC
  Administered 2014-09-20: 40 meq via ORAL
  Filled 2014-09-20: qty 2

## 2014-09-20 MED ORDER — POTASSIUM CHLORIDE 10 MEQ/100ML IV SOLN
10.0000 meq | INTRAVENOUS | Status: AC
  Administered 2014-09-20 (×2): 10 meq via INTRAVENOUS
  Filled 2014-09-20 (×2): qty 100

## 2014-09-20 NOTE — Clinical Documentation Improvement (Signed)
Current documentation reflects sepsis first mentioned in 09/16/14 progress note.  Please clarify if sepsis was present on admission and document in your progress note and discharge summary.  Thank you, Mateo Flow, RN [phone] Clinical Documentation Specialist

## 2014-09-20 NOTE — Progress Notes (Signed)
Patient ID: Melinda Conrad, female   DOB: 08-09-1951, 63 y.o.   MRN: [id number] 5 Days Post-Op  Subjective: Patient is much more alert and responsive today. States the pain medicine is working okay. Is a little bit hungry.  Had a dark bowel movement this morning but no obvious blood. Says she is unstable when she tries to get up.  Objective: Vital signs in last 24 hours: Temp:  [97.4 F (36.3 C)-98.9 F (37.2 C)] 98.1 F (36.7 C) (12/31 0502) Pulse Rate:  [63-91] 91 (12/31 0502) Resp:  [18-37] 18 (12/31 0502) BP: (127-155)/(75-96) 138/88 mmHg (12/31 0502) SpO2:  [90 %-98 %] 90 % (12/31 0502) Weight:  [131 lb 13.4 oz (59.8 kg)] 131 lb 13.4 oz (59.8 kg) (12/31 0550) Last BM Date: 09/19/14  Intake/Output from previous day: 12/30 0701 - 12/31 0700 In: 923 [P.O.:420; I.V.:453; IV Piggyback:50] Out: 2875 [Urine:2875] Intake/Output this shift:    General appearance: alert, cooperative and no distress Resp: no wheezing or increased work of breathing GI: minimal diffuse abdominal tenderness without guarding Incision/Wound: dressing clean and dry  Lab Results:   Recent Labs  09/18/14 0520 09/19/14 0500  WBC 11.7* 12.1*  HGB 11.9* 11.0*  HCT 35.0* 32.1*  PLT 23* 41*   BMET  Recent Labs  09/18/14 0520 09/19/14 0500  NA 139 138  K 3.9 3.7  CL 117* 116*  CO2 17* 19  GLUCOSE 149* 115*  BUN 20 20  CREATININE 0.51 0.58  CALCIUM 7.6* 7.5*     Studies/Results: Dg Chest Port 1 View  09/19/2014   CLINICAL DATA:  Respiratory failure.  EXAM: PORTABLE CHEST - 1 VIEW  COMPARISON:  09/17/2014.  FINDINGS: Right IJ line in stable position. Mediastinum normal. Heart size stable. Dense diffuse pulmonary infiltrates are present without significant interim clearing. No pleural effusion or pneumothorax.  IMPRESSION: 1. Right IJ line in stable position. 2. Severe dense diffuse bilateral pulmonary infiltrates, no change from prior exam .   Electronically Signed   By: [place]   On:  09/19/2014 10:00    Anti-infectives: Anti-infectives    Start     Dose/Rate Route Frequency Ordered Stop   09/17/14 1200  cefTRIAXone (ROCEPHIN) 2 g in dextrose 5 % 50 mL IVPB - Premix     2 g100 mL/hr over 30 Minutes Intravenous Every 24 hours 09/17/14 1013     09/15/14 1800  piperacillin-tazobactam (ZOSYN) IVPB 3.375 g  Status:  Discontinued     3.375 g12.5 mL/hr over 240 Minutes Intravenous Every 8 hours 09/15/14 1741 09/17/14 1013   09/14/14 2245  cefTRIAXone (ROCEPHIN) 1 g in dextrose 5 % 50 mL IVPB  Status:  Discontinued     1 g100 mL/hr over 30 Minutes Intravenous Every 24 hours 09/14/14 2238 09/15/14 1730      Assessment/Plan: She seems overall improved today. Status post Whipple procedure for T3 N0 cancer of the pancreas in November Admitted with portal vein thrombosis, GI bleed and Klebsiella bacteremia Thrombocytopenia of uncertain etiology possibly secondary to sepsis. Will need anticoagulation but currently held due to thrombocytopenia and GI bleed Physical therapy for mobilization Eating okay, nutrition encouraged On IV Rocephin for bacteremia     LOS: 6 days    Coni Homesley T 09/20/2014

## 2014-09-20 NOTE — Progress Notes (Signed)
PROGRESS NOTE  Melinda Conrad BHA:[id number] DOB: 1951/05/05 DOA: 09/14/2014 PCP: Kevan Ny, MD  Brief History 63 y/o female with history of pancreatitis, hypertension, pancreatic cancer with prior Whipple surgery (08/07/14) with preoperative radiation and chemotherapy. The patient presented to the hospital on 09/14/2014 with several days of melanotic stools, fever up to 104.24F, and hematemesis. The patient was found to be lethargic and hypotensive in the emergency department even with fluid resuscitation and PRBC infusion.  She was admitted to the ICU under the critical care service, and she was started on Levophed. On admission, hemoglobin was 5.8. 12/25 CT abd/pelvis showed two ovoid high-density lesions adjacent to the pancreatic stent within the jejunum could represent hemorrhage or hematoma. No additional gross evidence of surgical complication. Gastroenterology was consulted.the patient was placed on an IV Protonix drip. It was thought that the patient's GI bleed was due to chronic NSAID use vs ischemia induced from her hypotension.  EGD was deferred because of the patient's persistent hypotension.  Blood cultures performed at the time of admission revealed Klebsiella pneumoniae. The patient was initially started on Zosyn, but this was narrowed to ceftriaxone. The patient continued to have persistent abdominal pain.  Repeat CT of the abdomen and pelvis with contrast on 09/16/2014 was performed.  It revealed a new portal vein thrombus, bilateral pleural effusions with bibasilar infiltrates .  There was no abscess or leakage at her surgical sites. Gen. surgery was consulted. After careful consideration amongst all consultants due to the patient's recent GI bleed, the patient was started on a heparin drip.  The patient continued to have progressive thrombocytopenia. Heme/Onc, Dr. Benay Spice was consulted. The patient's heparin drip was subsequently held with the hopes that with her  thrombocytopenia will improved with treatment for sepsis.  Assessment/Plan: Severe Sepsis  -Present at the time of admission  -Secondary to bacteremia  -Continue antibiotics-->ceftriaxone 2 grams daily -Now stable off Levophed  Bacteremia -Klebsiella pneumoniae -etiology is likely GI source -12/30-surveillance blood cultures in the setting of portal vein thrombosis (?septic thrombophlebitis)--neg at 24hrs -continue ceftriaxone Hemorrhagic shock/acute blood loss anemia  -Secondary to GI bleed due to NSAIDs  -Hemoglobin now stable  -Blood pressure stable off pressors  -Patient has received 4 units PRBCs total  Portal vein thrombosis -Anticoagulation on hold until thrombocytopenia recovers -Decision on anticoagulation per consultants -restart heparin when platelets acceptable Acute respiratory failure  -There is concern that the patient may have developed TRALI due to her transfusions  -09/19/2014 chest x-ray shows stable bilateral dense pulmonary infiltrates -pt is presently stable on RA without any distress or dyspnea -Patient received furosemide 40 mg IV on 09/18/2014 and 09/19/2014 -Check BNP-->945 -Repeat dose of furosemide 40mg  IV today (12/31) -Repeat chest x-ray 09/21/2014 -echocardiogram Thrombocytopenia  -Likely secondary to sepsis with possible DIC and consumption  -starting to show signs of recovery -no schistocytes on peripheral smear, but suspect she may have had low grade DIC in the setting of sepsis with mild coagulopathy  -improving Acute kidney injury  -Secondary to sepsis and hypotension/hemodynamic effects  -improved--back to baseline Severe protein calorie malnutrition  -nutrition has seen -continue supplement Pancreatic adenocarcinoma -s/p Whipple's with preop XRT and chemo -Dr. Benay Spice has seen pt Goals of Care/Pain management -appreciate Palliative Medicine assistance -pt remains Full Code Deconditioning  -Patient has poor functional status due  to multiple comorbid conditions -Physical therapy-->HHPT    Family Communication:   Pt at beside Disposition Plan:   Home when medically  cleared by consultants    Antibiotics:  Zosyn 09/15/2014>>> 09/17/2014  Ceftriaxone 09/17/2014>>>    Procedures/Studies: Ct Abdomen Pelvis Wo Contrast  09/14/2014   CLINICAL DATA:  Recent Whipple procedure for pancreatic carcinoma in November 2017. New onset hematemesis.  EXAM: CT ABDOMEN AND PELVIS WITHOUT CONTRAST  TECHNIQUE: Multidetector CT imaging of the abdomen and pelvis was performed following the standard protocol without IV contrast.  COMPARISON:  CTA 03/04/2014  FINDINGS: Examination abdomen is limited by lack of IV oral contrast. Recommend difficult n postsurgical patient.  Lower chest: There is focus of bandlike atelectasis at right lung base. No pleural fluid. No pericardial fluid.  Hepatobiliary: No focal hepatic lesion on theis non contrast exam. Liver poorly evaluated. No gross evidence duct dilatation. Gallbladder surgically absent.  Pancreas: There is a pancreatic stent extending from the remaining pancreas into the jejunum through the pancreatic jejunostomy. There is a ovoid focus of high density material measuring 2.5 by 1.3 cm adjacent to the distal aspect of the stent within the small bowel (image 28, series 2). Second high-density collection measuring 3.0 x 1.8 cm on image 32, series 2 several cm distal to the first collection. These could represent focus as of hematoma or hemorrhage. There is no gross evidence of obstruction. There is no intraperitoneal free air.  Spleen: Normal.  Adrenals/urinary tract: Adrenal glands kidneys are normal. Ureters and bladder appear normal.  Stomach/Bowel: Stomach demonstrates postsurgical change consistent with Whipple procedure. There is some high-density material in the gastric fundus measuring 2.2 by 2.0 cm (image 23 from series 2). This could also represent small focus clot. The distal small bowel,  appendix, and colon are unremarkable. Small mild fluid along the pericolic gutters.  Vascular/Lymphatic: Abdominal or is normal caliber. No retroperitoneal periportal lymphadenopathy.  Reproductive: Uterus and ovaries are normal.  Musculoskeletal: No aggressive osseous lesion. Degenerate change the spine.  Other: [place] intracranial free air.  Small amount fluid.  IMPRESSION: 1. Two ovoid high-density lesions adjacent to the pancreatic stent within the jejunum could represent hemorrhage or hematoma. 2. No additional gross evidence of surgical complication on this non IV and oral contrast exam. 3. Potential small additional clot within the stomach is indeterminate. Findings conveyed toROBERT LOCKWOOD on 09/14/2014  at19:26.   Electronically Signed   By: Suzy Bouchard M.D.   On: 09/14/2014 19:27   Ct Abdomen Pelvis W Contrast  09/16/2014   CLINICAL DATA:  Melena and fever, history of pancreatic carcinoma as following Whipple procedure, elevated white blood cell count  EXAM: CT ABDOMEN AND PELVIS WITH CONTRAST  TECHNIQUE: Multidetector CT imaging of the abdomen and pelvis was performed using the standard protocol following bolus administration of intravenous contrast.  CONTRAST:  41mL OMNIPAQUE IOHEXOL 300 MG/ML SOLN, 143mL OMNIPAQUE IOHEXOL 300 MG/ML SOLN  COMPARISON:  09/14/2014  FINDINGS: New moderate bilateral pleural effusions are identified with lower lobe infiltrate worse in the right base then the left particularly in the right middle lobe. These changes are new from the prior exam and may account for the patient's elevated white blood cell count.  The liver demonstrates diffuse irregular enhancement likely related this the timing of the contrast bolus as the delayed images show a relatively normal enhancement pattern. There is however evidence of thrombus within the main portal vein just before its bifurcation into the right and left portal veins. It is not occlusive but occupies the majority of the  lumen of the portal vein.  The spleen, adrenal glands and kidneys are normal in their  CT appearance. No obstructive changes are noted.  Postoperative changes in the pancreas are seen with a pancreatic stent in place. Small amount of free fluid is noted within the abdomen which is new from the prior exam and likely related to the prior surgery. No definitive air-fluid collection to suggest abscess is identified.  The bladder is decompressed by Foley catheter. Diffuse anasarca is noted. The appendix is not well visualized although no inflammatory changes are seen. No pelvic mass lesion is seen. The osseous structures are within normal limits for the patient's given age.  IMPRESSION: Changes consistent with prior Whipple procedure with pancreatic stent in place. Some postoperative free fluid is noted although no organized collection to suggest abscess is noted at this time.  New main portal vein thrombus which is nonocclusive but occupies a majority of the lumen of the main portal vein. There is some differential enhancement of the liver identified which in part is likely related to this thrombus and timing of the contrast bolus.  New bilateral pleural effusions with bibasilar infiltrates particularly in the right middle lobe. This may be the etiology of the patient's elevated white blood cell count. These are new from the prior exam from 09/14/2014.  Changes of anasarca.  These results were called by telephone at the time of interpretation on 09/16/2014 at 3:48 pm to Amy, the pts nurse, who verbally acknowledged these results.   Electronically Signed   By: Inez Catalina M.D.   On: 09/16/2014 15:50   Dg Chest Port 1 View  09/19/2014   CLINICAL DATA:  Respiratory failure.  EXAM: PORTABLE CHEST - 1 VIEW  COMPARISON:  09/17/2014.  FINDINGS: Right IJ line in stable position. Mediastinum normal. Heart size stable. Dense diffuse pulmonary infiltrates are present without significant interim clearing. No pleural effusion or  pneumothorax.  IMPRESSION: 1. Right IJ line in stable position. 2. Severe dense diffuse bilateral pulmonary infiltrates, no change from prior exam .   Electronically Signed   By: [place]   On: 09/19/2014 10:00   Dg Chest Port 1 View  09/17/2014   CLINICAL DATA:  63 year old female with history of healthcare associated pneumonia presenting with shortness of breath and chest pain.  EXAM: PORTABLE CHEST - 1 VIEW  COMPARISON:  Chest x-ray 09/14/2014.  FINDINGS: There is a right-sided internal jugular central venous catheter with tip terminating in the distal superior vena cava. Marked worsening of aeration, with widespread interstitial and airspace disease throughout the lungs bilaterally, most confluent in the right mid to lower lung, concerning for severe multilobar pneumonia. No definite pleural effusions. Pulmonary vasculature is obscured. Heart size is upper limits of normal. Atherosclerosis in the thoracic aorta.  IMPRESSION: 1. Interval development of widespread interstitial and airspace disease throughout the lungs bilaterally concerning for severe multilobar pneumonia.   Electronically Signed   By: Vinnie Langton M.D.   On: 09/17/2014 10:52   Dg Chest Port 1 View  09/14/2014   CLINICAL DATA:  Central line placement.  Initial encounter.  EXAM: PORTABLE CHEST - 1 VIEW  COMPARISON:  Chest radiograph performed 08/10/2014  FINDINGS: The patient's right IJ line is noted ending about the mid SVC.  The lungs are well-aerated. Mild vascular congestion is noted. Increased interstitial markings may reflect minimal interstitial edema. There is no evidence of pleural effusion or pneumothorax.  The cardiomediastinal silhouette is borderline normal in size. No acute osseous abnormalities are seen.  IMPRESSION: 1. Right IJ line noted ending about the mid SVC. 2. Mild vascular  congestion noted. Increased interstitial markings may reflect minimal interstitial edema.   Electronically Signed   By: Garald Balding  M.D.   On: 09/14/2014 22:58   Dg Abd Portable 2v  09/16/2014   CLINICAL DATA:  Abdominal pain.  History of pancreatic cancer.  EXAM: PORTABLE ABDOMEN - 2 VIEW  COMPARISON:  CT abdomen 09/14/2014.  FINDINGS: Pancreatic stent remains centered over the mid abdomen. There is no bowel obstruction.  IMPRESSION: Negative.   Electronically Signed   By: Rolla Flatten M.D.   On: 09/16/2014 12:15         Subjective: Pt is c/o abdominal and L-flank pain but is a little better than yesterday after opioid adjustment.  She denies any fevers, chills, chest pain, shortness breath, nausea, vomiting, diarrhea, dysuria, hematuria. No headaches or dizziness.  Objective: Filed Vitals:   09/19/14 1719 09/19/14 2200 09/20/14 0502 09/20/14 0550  BP: 155/86 149/87 138/88   Pulse: 80 84 91   Temp: 97.4 F (36.3 C) 98.8 F (37.1 C) 98.1 F (36.7 C)   TempSrc: Oral Oral Oral   Resp: 26 20 18    Height:      Weight:    59.8 kg (131 lb 13.4 oz)  SpO2: 98% 94% 90%     Intake/Output Summary (Last 24 hours) at 09/20/14 7062 Last data filed at 09/20/14 3762  Gross per 24 hour  Intake   1003 ml  Output   2975 ml  Net  -1972 ml   Weight change:  Exam:   General:  Pt is alert, follows commands appropriately, not in acute distress  HEENT: No icterus, No thrush,  [place]/AT  Cardiovascular: RRR, S1/S2, no rubs, no gallops  Respiratory: Bilateral crackles. Good air movement. No wheezing.  Abdomen: Soft/+BS, non tender, non distended, no guarding  Extremities: trace LE edema, No lymphangitis, No petechiae, No rashes, no synovitis  Data Reviewed: Basic Metabolic Panel:  Recent Labs Lab 09/15/14 0538 09/16/14 0425 09/16/14 2000 09/17/14 0230 09/18/14 0520 09/19/14 0500  NA 134* 138  --  140 139 138  K 3.6 2.9*  --  3.1* 3.9 3.7  CL 112 118*  --  120* 117* 116*  CO2 15* 17*  --  17* 17* 19  GLUCOSE 162* 163*  --  105* 149* 115*  BUN 28* 34*  --  24* 20 20  CREATININE 0.73 0.96  --  0.69 0.51 0.58    CALCIUM 6.5* 7.2*  --  7.3* 7.6* 7.5*  MG  --  1.3* 2.6* 2.4 1.9  --   PHOS  --  1.5*  --   --  4.9*  --    Liver Function Tests:  Recent Labs Lab 09/14/14 1721 09/18/14 0520 09/19/14 0500  AST 89* 37 36  ALT 5 16 14   ALKPHOS 401* 182* 170*  BILITOT 1.5* 1.0 0.8  PROT 6.3 5.7* 5.9*  ALBUMIN 1.8* 1.4* 1.4*    Recent Labs Lab 09/14/14 1721  LIPASE <10*   No results for input(s): AMMONIA in the last 168 hours. CBC:  Recent Labs Lab 09/14/14 1721  09/17/14 0230 09/17/14 1000 09/17/14 1137 09/17/14 2015 09/18/14 0520 09/19/14 0500  WBC 20.8*  < > 12.1*  --  12.5* 11.8* 11.7* 12.1*  NEUTROABS 19.2*  --   --   --   --   --   --   --   HGB 5.8*  < > 11.6*  --  11.7* 11.8* 11.9* 11.0*  HCT 17.7*  < > 33.3*  --  33.4* 33.9* 35.0* 32.1*  MCV 89.4  < > 83.9  --  83.7 84.1 85.0 84.7  PLT 76*  < > 21* 22* 25* 23* 23* 41*  < > = values in this interval not displayed. Cardiac Enzymes: No results for input(s): CKTOTAL, CKMB, CKMBINDEX, TROPONINI in the last 168 hours. BNP: Invalid input(s): POCBNP CBG:  Recent Labs Lab 09/15/14 0208  GLUCAP 127*    Recent Results (from the past 240 hour(s))  Culture, blood (routine x 2)     Status: None   Collection Time: 09/15/14 12:10 AM  Result Value Ref Range Status   Specimen Description BLOOD R ARM  Final   Special Requests BOTTLES DRAWN AEROBIC ONLY 3CC  Final   Culture   Final    KLEBSIELLA PNEUMONIAE Note: SUSCEPTIBILITIES PERFORMED ON PREVIOUS CULTURE WITHIN THE LAST 5 DAYS. Note: Gram Stain Report Called to,Read Back By and Verified With: STEPHANIE DILLON 09/15/14 @ 5:03PM BY RUSCOE A. Performed at [facility]    Report Status 09/17/2014 FINAL  Final  MRSA PCR Screening     Status: None   Collection Time: 09/15/14 12:27 AM  Result Value Ref Range Status   MRSA by PCR NEGATIVE NEGATIVE Final    Comment:        The GeneXpert MRSA Assay (FDA approved for NASAL specimens only), is one component of  a comprehensive MRSA colonization surveillance program. It is not intended to diagnose MRSA infection nor to guide or monitor treatment for MRSA infections.   Culture, blood (routine x 2)     Status: None   Collection Time: 09/15/14 12:38 AM  Result Value Ref Range Status   Specimen Description BLOOD R IS CVC  Final   Special Requests BOTTLES DRAWN AEROBIC AND ANAEROBIC 5CC EACH  Final   Culture   Final    KLEBSIELLA PNEUMONIAE Note: Gram Stain Report Called to,Read Back By and Verified With: STEPHANIE DILLON 09/15/14 @ 5:03PM BY RUSCOE A Performed at [facility]    Report Status 09/17/2014 FINAL  Final   Organism ID, Bacteria KLEBSIELLA PNEUMONIAE  Final      Susceptibility   Klebsiella pneumoniae - MIC*    AMPICILLIN RESISTANT      AMPICILLIN/SULBACTAM 8 SENSITIVE Sensitive     CEFAZOLIN <=4 SENSITIVE Sensitive     CEFEPIME <=1 SENSITIVE Sensitive     CEFTAZIDIME <=1 SENSITIVE Sensitive     CEFTRIAXONE <=1 SENSITIVE Sensitive     CIPROFLOXACIN <=0.25 SENSITIVE Sensitive     GENTAMICIN <=1 SENSITIVE Sensitive     IMIPENEM <=0.25 SENSITIVE Sensitive     PIP/TAZO <=4 SENSITIVE Sensitive     TOBRAMYCIN <=1 SENSITIVE Sensitive     TRIMETH/SULFA <=20 SENSITIVE Sensitive     * KLEBSIELLA PNEUMONIAE     Scheduled Meds: . antiseptic oral rinse  7 mL Mouth Rinse q12n4p  . cefTRIAXone (ROCEPHIN)  IV  2 g Intravenous Q24H  . chlorhexidine  15 mL Mouth Rinse BID  . feeding supplement (ENSURE COMPLETE)  237 mL Oral TID BM  . fentaNYL  25 mcg Transdermal Q72H  . lidocaine  1 patch Transdermal Q24H  . pantoprazole (PROTONIX) IV  40 mg Intravenous Q12H  . pregabalin  25 mg Oral QHS   Continuous Infusions: . dextrose 5 % and 0.45% NaCl 10 mL/hr at 09/19/14 1027     Fernanda Twaddell, DO  [facility] Pager [phone]  If 7PM-7AM, please contact night-coverage [email or web address] Password [email or web address] 09/20/2014, 6:42 AM   LOS: 6  days

## 2014-09-21 ENCOUNTER — Inpatient Hospital Stay (HOSPITAL_COMMUNITY): Payer: Medicaid Other

## 2014-09-21 DIAGNOSIS — E876 Hypokalemia: Secondary | ICD-10-CM

## 2014-09-21 DIAGNOSIS — I369 Nonrheumatic tricuspid valve disorder, unspecified: Secondary | ICD-10-CM

## 2014-09-21 LAB — BASIC METABOLIC PANEL
ANION GAP: 5 (ref 5–15)
BUN: 12 mg/dL (ref 6–23)
CALCIUM: 7.3 mg/dL — AB (ref 8.4–10.5)
CHLORIDE: 107 meq/L (ref 96–112)
CO2: 24 mmol/L (ref 19–32)
Creatinine, Ser: 0.57 mg/dL (ref 0.50–1.10)
GFR calc Af Amer: >90 mL/min (ref >90)
GFR calc non Af Amer: >90 mL/min (ref >90)
Glucose, Bld: 99 mg/dL (ref 70–99)
Potassium: 2.9 mmol/L — ABNORMAL LOW (ref 3.5–5.1)
Sodium: 136 mmol/L (ref 135–145)

## 2014-09-21 LAB — BRAIN NATRIURETIC PEPTIDE: B Natriuretic Peptide: 525.4 pg/mL — ABNORMAL HIGH (ref 0.0–100.0)

## 2014-09-21 LAB — CBC
HCT: 30.3 % — ABNORMAL LOW (ref 36.0–46.0)
Hemoglobin: 9.9 g/dL — ABNORMAL LOW (ref 12.0–15.0)
MCH: 28.6 pg (ref 26.0–34.0)
MCHC: 32.7 g/dL (ref 30.0–36.0)
MCV: 87.6 fL (ref 78.0–100.0)
PLATELETS: 67 10*3/uL — AB (ref 150–400)
RBC: 3.46 MIL/uL — ABNORMAL LOW (ref 3.87–5.11)
RDW: 15.5 % (ref 11.5–15.5)
WBC: 8.9 10*3/uL (ref 4.0–10.5)

## 2014-09-21 LAB — MAGNESIUM: Magnesium: 1.3 mg/dL — ABNORMAL LOW (ref 1.5–2.5)

## 2014-09-21 MED ORDER — FUROSEMIDE 10 MG/ML IJ SOLN
40.0000 mg | Freq: Once | INTRAMUSCULAR | Status: AC
  Administered 2014-09-21: 40 mg via INTRAVENOUS
  Filled 2014-09-21: qty 4

## 2014-09-21 MED ORDER — FUROSEMIDE 10 MG/ML IJ SOLN: 40.0000 mg | Freq: Once | INTRAMUSCULAR | Status: DC

## 2014-09-21 MED ORDER — MAGNESIUM SULFATE 4 GM/100ML IV SOLN
4.0000 g | Freq: Once | INTRAVENOUS | Status: AC
  Administered 2014-09-21: 4 g via INTRAVENOUS
  Filled 2014-09-21: qty 100

## 2014-09-21 MED ORDER — POTASSIUM CHLORIDE CRYS ER 20 MEQ PO TBCR
40.0000 meq | EXTENDED_RELEASE_TABLET | ORAL | Status: AC
  Administered 2014-09-21 (×2): 40 meq via ORAL
  Filled 2014-09-21 (×3): qty 2

## 2014-09-21 MED ORDER — POTASSIUM CHLORIDE 10 MEQ/100ML IV SOLN
10.0000 meq | INTRAVENOUS | Status: AC
  Administered 2014-09-21 (×4): 10 meq via INTRAVENOUS
  Filled 2014-09-21 (×4): qty 100

## 2014-09-21 MED ORDER — POTASSIUM PHOSPHATES 15 MMOLE/5ML IV SOLN: 10.0000 meq | INTRAVENOUS | Status: DC

## 2014-09-21 NOTE — Progress Notes (Signed)
Patient ID: BRYNNAN RODENBAUGH, female   DOB: 1951-08-05, 64 y.o.   MRN: [id number] Patient ID: KYMORA SCIARA, female   DOB: Mar 22, 1951, 64 y.o.   MRN: [id number] 6 Days Post-Op  Subjective: Alert and comfortable. Minimal abdominal discomfort that is improved. She is eating breakfast and appetite is better.  Objective: Vital signs in last 24 hours: Temp:  [97.9 F (36.6 C)-98.6 F (37 C)] 98.2 F (36.8 C) (01/01 0550) Pulse Rate:  [77-87] 77 (01/01 0550) Resp:  [14-18] 14 (01/01 0550) BP: (115-148)/(76-92) 115/76 mmHg (01/01 0550) SpO2:  [94 %-96 %] 96 % (01/01 0550) Weight:  [126 lb 8.7 oz (57.4 kg)] 126 lb 8.7 oz (57.4 kg) (01/01 0550) Last BM Date: 09/19/14  Intake/Output from previous day: 12/31 0701 - 01/01 0700 In: 740 [P.O.:240; I.V.:250; IV Piggyback:250] Out: 2200 [Urine:2200] Intake/Output this shift:    General appearance: alert, cooperative and no distress Resp: no wheezing or increased work of breathing GI: minimal diffuse abdominal tenderness without guarding Incision/Wound: dressing clean and dry  Lab Results:   Recent Labs  09/20/14 1100 09/21/14 0510  WBC 10.1 8.9  HGB 10.8* 9.9*  HCT 32.2* 30.3*  PLT 63* 67*   BMET  Recent Labs  09/20/14 1100 09/21/14 0510  NA 139 136  K 3.0* 2.9*  CL 113* 107  CO2 20 24  GLUCOSE 74 99  BUN 16 12  CREATININE 0.50 0.57  CALCIUM 7.4* 7.3*     Studies/Results: Dg Chest Port 1 View  09/21/2014   CLINICAL DATA:  Infiltrates, shortness of breath.  EXAM: PORTABLE CHEST - 1 VIEW  COMPARISON:  09/19/2014  FINDINGS: Right central line remains in place, unchanged. Severe diffuse bilateral pulmonary airspace opacities are again noted, unchanged. Heart is normal size. No effusions or acute bony abnormality.  IMPRESSION: Severe diffuse bilateral airspace disease, unchanged.   Electronically Signed   By: Rolm Baptise M.D.   On: 09/21/2014 07:37   Dg Chest Port 1 View  09/19/2014   CLINICAL DATA:  Respiratory failure.   EXAM: PORTABLE CHEST - 1 VIEW  COMPARISON:  09/17/2014.  FINDINGS: Right IJ line in stable position. Mediastinum normal. Heart size stable. Dense diffuse pulmonary infiltrates are present without significant interim clearing. No pleural effusion or pneumothorax.  IMPRESSION: 1. Right IJ line in stable position. 2. Severe dense diffuse bilateral pulmonary infiltrates, no change from prior exam .   Electronically Signed   By: [place]   On: 09/19/2014 10:00    Anti-infectives: Anti-infectives    Start     Dose/Rate Route Frequency Ordered Stop   09/17/14 1200  cefTRIAXone (ROCEPHIN) 2 g in dextrose 5 % 50 mL IVPB - Premix     2 g100 mL/hr over 30 Minutes Intravenous Every 24 hours 09/17/14 1013     09/15/14 1800  piperacillin-tazobactam (ZOSYN) IVPB 3.375 g  Status:  Discontinued     3.375 g12.5 mL/hr over 240 Minutes Intravenous Every 8 hours 09/15/14 1741 09/17/14 1013   09/14/14 2245  cefTRIAXone (ROCEPHIN) 1 g in dextrose 5 % 50 mL IVPB  Status:  Discontinued     1 g100 mL/hr over 30 Minutes Intravenous Every 24 hours 09/14/14 2238 09/15/14 1730      Assessment/Plan: She seems overall improved today. Status post Whipple procedure for T3 N0 cancer of the pancreas in November Admitted with portal vein thrombosis, GI bleed and Klebsiella bacteremia Thrombocytopenia of uncertain etiology possibly secondary to sepsis. Will need anticoagulation but currently held due to  thrombocytopenia and GI bleed-Thrombocytopenia improved. Question time to start anticoagulation? Will leave to medicine. Physical therapy for mobilization Eating okay, nutrition encouraged On IV Rocephin for bacteremia Hypokalemia, potassium ordered    LOS: 7 days    Shammara Jarrett T 09/21/2014

## 2014-09-21 NOTE — Progress Notes (Signed)
[facility] PROGRESS NOTE  Melinda Conrad ZJQ:[id number] DOB: 1950-12-26 DOA: 09/14/2014 PCP: Kevan Ny, MD  Brief History 64 y/o female with history of pancreatitis, hypertension, pancreatic cancer with prior Whipple surgery (08/07/14) with preoperative radiation and chemotherapy. The patient presented to the hospital on 09/14/2014 with several days of melanotic stools, fever up to 104.22F, and hematemesis. The patient was found to be lethargic and hypotensive in the emergency department even with fluid resuscitation and PRBC infusion. She was admitted to the ICU under the critical care service, and she was started on Levophed. On admission, hemoglobin was 5.8. 12/25 CT abd/pelvis showed two ovoid high-density lesions adjacent to the pancreatic stent within the jejunum could represent hemorrhage or hematoma. No additional gross evidence of surgical complication. Gastroenterology was consulted.the patient was placed on an IV Protonix drip. It was thought that the patient's GI bleed was due to chronic NSAID use vs ischemia induced from her hypotension. EGD was deferred because of the patient's persistent hypotension. Blood cultures performed at the time of admission revealed Klebsiella pneumoniae. The patient was initially started on Zosyn, but this was narrowed to ceftriaxone. The patient continued to have persistent abdominal pain. Repeat CT of the abdomen and pelvis with contrast on 09/16/2014 was performed. It revealed a new portal vein thrombus, bilateral pleural effusions with bibasilar infiltrates . There was no abscess or leakage at her surgical sites. Gen. surgery was consulted. After careful consideration amongst all consultants due to the patient's recent GI bleed, the patient was started on a heparin drip. The patient continued to have progressive thrombocytopenia. Heme/Onc, Dr. Benay Spice was consulted. The patient's heparin drip was subsequently held with the hopes that with her  thrombocytopenia will improved with treatment for sepsis.  Assessment/Plan: #1 severe sepsis Patient was admitted with sepsis felt to be secondary to her bacteremia. Patient improving clinically. Afebrile. Patient off pressors. Blood cultures with Klebsiella pneumonia likely seeded from the GI tract. Patient currently on IV Rocephin. Follow.  #2 Klebsiella pneumonia bacteremia Likely seeded from the GI source. 12/30 surveillance blood cultures in the setting of portal vein thromboses were negative at 24 hours still pending. Continue IV Rocephin.  #3 hemorrhagic shock/acute blood loss anemia Likely secondary to chronic NSAID use. Patient with no further bleeding today. Patient status post 4 units packed red blood cells. Hemoglobin currently stable. Follow.Continue PPI BID. GI was following and recommend monitoring at this point in time.  #4 portal vein thrombosis Anticoagulation held secondary to significant thrombocytopenia. Platelet count improving. Decision of resuming anticoagulation will be deferred to consultants. Platelet count improving. Follow.  #5 acute respiratory failure Concern for TRALI secondary to transfusions versus acute lung injury from bacteremia. Patient satting well. Patient hemodynamically stable. Patients with some shortness of breath however states improving. Patient diuresed 2.2 L yesterday and is currently +6 L since admission. Patient received 40 mg of IV Lasix on 12/29, 12/30, 12/31. Will give a dose of Lasix 40 mg IV 1 today. Follow. Continue empiric IV antibiotics.  #6 thrombocytopenia Likely secondary to sepsis with possible DIC and consumption. Platelet function showing some recovery and improving. No schistocytes on peripheral smear. Patient may have had low-grade DIC in the setting of sepsis and mild quite a lot of the. Follow.  #7 acute kidney injury Secondary to sepsis and pre-azotemia secondary to hypotension. Improved and currently at baseline. Monitor  with diuresis.  #8 severe protein calorie malnutrition Patient has been seen by dietitian. Continue current supplements. Diet has been advanced. Follow.  #9  pancreatic adenocarcinoma Status post Whipple's with preoperative XRT and chemotherapy. Patient has been seen by Dr. Benay Spice of oncology and chemotherapy on hold for now. Outpatient follow-up with oncology.  #10 hypokalemia/hypomagnesemia Likely secondary to diuretics. Repleted. Keep magnesium greater than 2. Keep potassium greater than 4.  #11 deconditioning/debility Patient has been seen by physical therapy recommended home health PT.  #12 prophylaxis Protonix for GI prophylaxis. SCDs for DVT prophylaxis.    Code Status: Full Family Communication: Updated patient no family present. Disposition Plan: Home with home health when medically stable.   Consultants:  Palliative care Dr. Hilma Favors 09/19/2014  Gastroenterology Dr. Deatra Ina 09/15/2014  Hematology/oncology Dr. Benay Spice: 09/17/2014  Procedures: R IJ CVL 12/25 >>  12/25 CT abd/pelvis: Two ovoid high-density lesions adjacent to the pancreatic stent within the jejunum could represent hemorrhage or hematoma. No additional gross evidence of surgical complication on this non IV and oral contrast exam. Potential small additional clot within the stomach is indeterminate 09/16/2014 CT abdomen and pelvis:Changes consistent with prior Whipple procedure with pancreatic stent in place. Some postoperative free fluid is noted although no organized collection to suggest abscess is noted at this time.  New main portal vein thrombus which is nonocclusive but occupies a majority of the lumen of the main portal vein. There is some differential enhancement of the liver identified which in part is likely related to this thrombus and timing of the contrast bolus.  New bilateral pleural effusions with bibasilar infiltrates particularly in the right middle lobe. This may be the etiology  of the patient's elevated white blood cell count. These are new from the prior exam from 09/14/2014.  Changes of anasarca.  12/25 Admitted with UGIB to ICU. 2 units PRBCs ordered Chest x-ray 09/14/2014, 09/17/2014, 09/19/2014, 09/21/2014 KUB 09/16/2014 2-D echo 09/21/2014  Antibiotics: Ceftriaxone 12/25 >> 12/26 Zosyn 12/26 >>12/28 Rocephin 12/28>>>  HPI/Subjective: Patient states she slept better last night. Patient denies any acute bleeding. Patient states tolerating diet. Patient still states some shortness of breath however improving. Patient states some abdominal pain however improving.   Objective: Filed Vitals:   09/21/14 0550  BP: 115/76  Pulse: 77  Temp: 98.2 F (36.8 C)  Resp: 14    Intake/Output Summary (Last 24 hours) at 09/21/14 1053 Last data filed at 09/21/14 1000  Gross per 24 hour  Intake    820 ml  Output   2200 ml  Net  -1380 ml   Filed Weights   09/18/14 0500 09/20/14 0550 09/21/14 0550  Weight: 56.3 kg (124 lb 1.9 oz) 59.8 kg (131 lb 13.4 oz) 57.4 kg (126 lb 8.7 oz)    Exam:   General:  NAD  Cardiovascular: RRR  Respiratory: Bibasilar crackles  Abdomen: Soft, some diffuse tenderness to palpation, positive bowel sounds, no rebound, no guarding.  Musculoskeletal: No clubbing cyanosis or edema.  Data Reviewed: Basic Metabolic Panel:  Recent Labs Lab 09/16/14 0425 09/16/14 2000 09/17/14 0230 09/18/14 0520 09/19/14 0500 09/20/14 1100 09/21/14 0510  NA 138  --  140 139 138 139 136  K 2.9*  --  3.1* 3.9 3.7 3.0* 2.9*  CL 118*  --  120* 117* 116* 113* 107  CO2 17*  --  17* 17* 19 20 24   GLUCOSE 163*  --  105* 149* 115* 74 99  BUN 34*  --  24* 20 20 16 12   CREATININE 0.96  --  0.69 0.51 0.58 0.50 0.57  CALCIUM 7.2*  --  7.3* 7.6* 7.5* 7.4* 7.3*  MG 1.3*  2.6* 2.4 1.9  --   --  1.3*  PHOS 1.5*  --   --  4.9*  --   --   --    Liver Function Tests:  Recent Labs Lab 09/14/14 1721 09/18/14 0520 09/19/14 0500  AST 89* 37 36   ALT 5 16 14   ALKPHOS 401* 182* 170*  BILITOT 1.5* 1.0 0.8  PROT 6.3 5.7* 5.9*  ALBUMIN 1.8* 1.4* 1.4*    Recent Labs Lab 09/14/14 1721  LIPASE <10*   No results for input(s): AMMONIA in the last 168 hours. CBC:  Recent Labs Lab 09/14/14 1721  09/17/14 2015 09/18/14 0520 09/19/14 0500 09/20/14 1100 09/21/14 0510  WBC 20.8*  < > 11.8* 11.7* 12.1* 10.1 8.9  NEUTROABS 19.2*  --   --   --   --   --   --   HGB 5.8*  < > 11.8* 11.9* 11.0* 10.8* 9.9*  HCT 17.7*  < > 33.9* 35.0* 32.1* 32.2* 30.3*  MCV 89.4  < > 84.1 85.0 84.7 86.3 87.6  PLT 76*  < > 23* 23* 41* 63* 67*  < > = values in this interval not displayed. Cardiac Enzymes: No results for input(s): CKTOTAL, CKMB, CKMBINDEX, TROPONINI in the last 168 hours. BNP (last 3 results) No results for input(s): PROBNP in the last 8760 hours. CBG:  Recent Labs Lab 09/15/14 0208  GLUCAP 127*    Recent Results (from the past 240 hour(s))  Culture, blood (routine x 2)     Status: None   Collection Time: 09/15/14 12:10 AM  Result Value Ref Range Status   Specimen Description BLOOD R ARM  Final   Special Requests BOTTLES DRAWN AEROBIC ONLY 3CC  Final   Culture   Final    KLEBSIELLA PNEUMONIAE Note: SUSCEPTIBILITIES PERFORMED ON PREVIOUS CULTURE WITHIN THE LAST 5 DAYS. Note: Gram Stain Report Called to,Read Back By and Verified With: STEPHANIE DILLON 09/15/14 @ 5:03PM BY RUSCOE A. Performed at [facility]    Report Status 09/17/2014 FINAL  Final  MRSA PCR Screening     Status: None   Collection Time: 09/15/14 12:27 AM  Result Value Ref Range Status   MRSA by PCR NEGATIVE NEGATIVE Final    Comment:        The GeneXpert MRSA Assay (FDA approved for NASAL specimens only), is one component of a comprehensive MRSA colonization surveillance program. It is not intended to diagnose MRSA infection nor to guide or monitor treatment for MRSA infections.   Culture, blood (routine x 2)     Status: None   Collection  Time: 09/15/14 12:38 AM  Result Value Ref Range Status   Specimen Description BLOOD R IS CVC  Final   Special Requests BOTTLES DRAWN AEROBIC AND ANAEROBIC 5CC EACH  Final   Culture   Final    KLEBSIELLA PNEUMONIAE Note: Gram Stain Report Called to,Read Back By and Verified With: STEPHANIE DILLON 09/15/14 @ 5:03PM BY RUSCOE A Performed at [facility]    Report Status 09/17/2014 FINAL  Final   Organism ID, Bacteria KLEBSIELLA PNEUMONIAE  Final      Susceptibility   Klebsiella pneumoniae - MIC*    AMPICILLIN RESISTANT      AMPICILLIN/SULBACTAM 8 SENSITIVE Sensitive     CEFAZOLIN <=4 SENSITIVE Sensitive     CEFEPIME <=1 SENSITIVE Sensitive     CEFTAZIDIME <=1 SENSITIVE Sensitive     CEFTRIAXONE <=1 SENSITIVE Sensitive     CIPROFLOXACIN <=0.25 SENSITIVE Sensitive  GENTAMICIN <=1 SENSITIVE Sensitive     IMIPENEM <=0.25 SENSITIVE Sensitive     PIP/TAZO <=4 SENSITIVE Sensitive     TOBRAMYCIN <=1 SENSITIVE Sensitive     TRIMETH/SULFA <=20 SENSITIVE Sensitive     * KLEBSIELLA PNEUMONIAE  Culture, blood (routine x 2)     Status: None (Preliminary result)   Collection Time: 09/19/14  2:50 PM  Result Value Ref Range Status   Specimen Description BLOOD RIGHT ARM  Final   Special Requests BOTTLES DRAWN AEROBIC ONLY 6CC  Final   Culture   Final           BLOOD CULTURE RECEIVED NO GROWTH TO DATE CULTURE WILL BE HELD FOR 5 DAYS BEFORE ISSUING A FINAL NEGATIVE REPORT Performed at [facility]    Report Status PENDING  Incomplete  Culture, blood (routine x 2)     Status: None (Preliminary result)   Collection Time: 09/19/14  3:05 PM  Result Value Ref Range Status   Specimen Description BLOOD RIGHT ARM  Final   Special Requests BOTTLES DRAWN AEROBIC ONLY 4CC  Final   Culture   Final           BLOOD CULTURE RECEIVED NO GROWTH TO DATE CULTURE WILL BE HELD FOR 5 DAYS BEFORE ISSUING A FINAL NEGATIVE REPORT Performed at [facility]    Report Status PENDING  Incomplete      Studies: Dg Chest Port 1 View  09/21/2014   CLINICAL DATA:  Infiltrates, shortness of breath.  EXAM: PORTABLE CHEST - 1 VIEW  COMPARISON:  09/19/2014  FINDINGS: Right central line remains in place, unchanged. Severe diffuse bilateral pulmonary airspace opacities are again noted, unchanged. Heart is normal size. No effusions or acute bony abnormality.  IMPRESSION: Severe diffuse bilateral airspace disease, unchanged.   Electronically Signed   By: Rolm Baptise M.D.   On: 09/21/2014 07:37    Scheduled Meds: . antiseptic oral rinse  7 mL Mouth Rinse q12n4p  . cefTRIAXone (ROCEPHIN)  IV  2 g Intravenous Q24H  . chlorhexidine  15 mL Mouth Rinse BID  . feeding supplement (ENSURE COMPLETE)  237 mL Oral TID BM  . fentaNYL  25 mcg Transdermal Q72H  . lidocaine  1 patch Transdermal Q24H  . magnesium sulfate 1 - 4 g bolus IVPB  4 g Intravenous Once  . pantoprazole (PROTONIX) IV  40 mg Intravenous Q12H  . potassium chloride  10 mEq Intravenous Q1 Hr x 4  . potassium chloride  40 mEq Oral Q4H  . pregabalin  25 mg Oral QHS  . sodium chloride  10-40 mL Intracatheter Q12H   Continuous Infusions: . dextrose 5 % and 0.45% NaCl 10 mL/hr at 09/20/14 2011    Principal Problem:   Septic shock Active Problems:   Hemorrhagic shock   Depression   Protein-calorie malnutrition, severe   Acute GI bleeding   Thrombocytopenia   Portal vein thrombosis   Sepsis   Acute respiratory failure with hypoxia   Hypokalemia   Hypomagnesemia    Time spent: 40 mins    [facility] MD [facility] Pager [phone]. If 7PM-7AM, please contact night-coverage at [email or web address], password [email or web address] 09/21/2014, 10:53 AM  LOS: 7 days

## 2014-09-21 NOTE — Progress Notes (Signed)
Pt could benefit from a nutrition evaluation. Pt eats only bites and could benefit from an appetite stimulator. Pt drinks only sips of ensure and is interested in other options. We spoke of many options and mentioned she likes ice cream. Please consider an appetite stimulator.  She also complains of having low energy. Her family is requesting vitamin B-12 to increase her tolerance for activity during the day.  MCCLAIN, Melinda Conrad 09/21/2014 7:09 PM

## 2014-09-21 NOTE — Progress Notes (Signed)
Physical Therapy Treatment Patient Details Name: Melinda Conrad MRN: [id number] DOB: March 19, 1951 Today's Date: 09/21/2014    History of Present Illness 63 yo female adm 09/14/14 with GIB, hemorrhagic shock, Hgb 5.8 on admission; PMHx:  s/p whipple on 08/07/14, depression, hx substance abuse. Portal vein thrombosis, respiratory failure, pancreatic cancer.     PT Comments    **Min assist for supine to sit and to take several steps from bed to recliner with RW. Activity tolerance limited by fatigue.  Instructed pt in BLE strengthening exercises. She is significantly deconditioned. *  Follow Up Recommendations  Home health PT;Supervision/Assistance - 24 hour     Equipment Recommendations  Rolling walker with 5" wheels (? TBD)    Recommendations for Other Services       Precautions / Restrictions Precautions Precautions: Fall Precaution Comments: abdominal surgery Restrictions Weight Bearing Restrictions: No    Mobility  Bed Mobility Overal bed mobility: Needs Assistance Bed Mobility: Rolling Rolling: Supervision Sidelying to sit: Min assist       General bed mobility comments: bed flat, used hand rail, cues for technique, min A to raise trunk (pt 90%)  Transfers Overall transfer level: Needs assistance Equipment used: Rolling walker (2 wheeled) Transfers: Sit to/from Stand Sit to Stand: Supervision         General transfer comment: cues for hand placement  Ambulation/Gait Ambulation/Gait assistance: Min guard Ambulation Distance (Feet): 6 Feet Assistive device: Rolling walker (2 wheeled) Gait Pattern/deviations: Step-to pattern   Gait velocity interpretation: Below normal speed for age/gender General Gait Details: pt took several steps from bed to recliner, stated she didn't feel she could walk farther   [employer]            Wheelchair Mobility    Modified Rankin (Stroke Patients Only)       Balance Overall balance assessment: Modified  Independent                                  Cognition Arousal/Alertness: Awake/alert Behavior During Therapy: WFL for tasks assessed/performed Overall Cognitive Status: Within Functional Limits for tasks assessed                      Exercises General Exercises - Lower Extremity Ankle Circles/Pumps: AROM;Both;10 reps;Supine Long Arc Quad: AROM;Both;10 reps;Seated Hip Flexion/Marching: AROM;Both;10 reps;Seated    General Comments        Pertinent Vitals/Pain Pain Score: 6  Pain Location: abdomen Pain Descriptors / Indicators: Sore Pain Intervention(s): Patient requesting pain meds-RN notified;Limited activity within patient's tolerance;Monitored during session    Home Living                      Prior Function            PT Goals (current goals can now be found in the care plan section) Acute Rehab PT Goals Patient Stated Goal: pt wants to be able to move, wants pain to be better; be able to go home with dtr PT Goal Formulation: With patient Time For Goal Achievement: 10/03/14 Potential to Achieve Goals: Good Progress towards PT goals: Progressing toward goals    Frequency  Min 3X/week    PT Plan Current plan remains appropriate    Co-evaluation             End of Session   Activity Tolerance: Patient limited by fatigue Patient left: with call bell/phone within reach;in  chair     Time: 4193-7902 PT Time Calculation (min) (ACUTE ONLY): 18 min  Charges:  $Therapeutic Activity: 8-22 mins                    G Codes:      Philomena Doheny 09/21/2014, 9:47 AM [phone]

## 2014-09-21 NOTE — Progress Notes (Signed)
  Echocardiogram 2D Echocardiogram has been performed.  Melinda Conrad 09/21/2014, 9:24 AM

## 2014-09-22 ENCOUNTER — Inpatient Hospital Stay (HOSPITAL_COMMUNITY): Payer: Medicaid Other

## 2014-09-22 DIAGNOSIS — I959 Hypotension, unspecified: Secondary | ICD-10-CM

## 2014-09-22 DIAGNOSIS — A419 Sepsis, unspecified organism: Secondary | ICD-10-CM | POA: Diagnosis present

## 2014-09-22 LAB — CBC
HCT: 29.4 % — ABNORMAL LOW (ref 36.0–46.0)
HEMOGLOBIN: 9.5 g/dL — AB (ref 12.0–15.0)
MCH: 28.7 pg (ref 26.0–34.0)
MCHC: 32.3 g/dL (ref 30.0–36.0)
MCV: 88.8 fL (ref 78.0–100.0)
PLATELETS: 75 10*3/uL — AB (ref 150–400)
RBC: 3.31 MIL/uL — ABNORMAL LOW (ref 3.87–5.11)
RDW: 15.1 % (ref 11.5–15.5)
WBC: 7.1 10*3/uL (ref 4.0–10.5)

## 2014-09-22 LAB — BASIC METABOLIC PANEL
Anion gap: 5 (ref 5–15)
BUN: 10 mg/dL (ref 6–23)
CO2: 26 mmol/L (ref 19–32)
CREATININE: 0.51 mg/dL (ref 0.50–1.10)
Calcium: 7.5 mg/dL — ABNORMAL LOW (ref 8.4–10.5)
Chloride: 105 mEq/L (ref 96–112)
GFR calc non Af Amer: >90 mL/min (ref >90)
Glucose, Bld: 95 mg/dL (ref 70–99)
POTASSIUM: 4.1 mmol/L (ref 3.5–5.1)
Sodium: 136 mmol/L (ref 135–145)

## 2014-09-22 LAB — MAGNESIUM: MAGNESIUM: 1.8 mg/dL (ref 1.5–2.5)

## 2014-09-22 LAB — HEPARIN LEVEL (UNFRACTIONATED): Heparin Unfractionated: <0.1 IU/mL — ABNORMAL LOW (ref 0.30–0.70)

## 2014-09-22 MED ORDER — HEPARIN (PORCINE) IN NACL 100-0.45 UNIT/ML-% IJ SOLN
1000.0000 [IU]/h | INTRAMUSCULAR | Status: DC
  Administered 2014-09-22: 800 [IU]/h via INTRAVENOUS
  Filled 2014-09-22: qty 250

## 2014-09-22 MED ORDER — FUROSEMIDE 10 MG/ML IJ SOLN
40.0000 mg | Freq: Once | INTRAMUSCULAR | Status: AC
  Administered 2014-09-22: 40 mg via INTRAVENOUS
  Filled 2014-09-22: qty 4

## 2014-09-22 MED ORDER — MAGNESIUM SULFATE 2 GM/50ML IV SOLN
2.0000 g | Freq: Once | INTRAVENOUS | Status: AC
  Administered 2014-09-22: 2 g via INTRAVENOUS
  Filled 2014-09-22: qty 50

## 2014-09-22 NOTE — Progress Notes (Signed)
ANTICOAGULATION CONSULT NOTE - Initial Consult  Pharmacy Consult for Heparin Indication: portal vein thrombosis  No Known Allergies  Patient Measurements: Height: 5\' 3"  (160 cm) Weight: 123 lb 0.3 oz (55.8 kg) IBW/kg (Calculated) : 52.4   Vital Signs: Temp: 99.2 F (37.3 C) (01/01 2150) Temp Source: Oral (01/01 2150) BP: 125/69 mmHg (01/01 2150) Pulse Rate: 61 (01/01 2150)  Labs:  Recent Labs  09/20/14 1100 09/21/14 0510 09/22/14 0500  HGB 10.8* 9.9* 9.5*  HCT 32.2* 30.3* 29.4*  PLT 63* 67* 75*  CREATININE 0.50 0.57 0.51    Estimated Creatinine Clearance: 61.1 mL/min (by C-G formula based on Cr of 0.51).   Medical History: Past Medical History  Diagnosis Date  . Arthritis   . Depression   . H/O: substance abuse   . Back pain   . Personal history of colonic polyp - adenoma 10/26/2013    10/26/2013 diminutive sigmoid polyp removed    . Pancreatitis, acute   . Hypertension   . Osteoporosis   . Radiation 04/30/14-06/08/14    pancreas 50 gray  . Cancer     pancreatic cancer    Medications:  Scheduled:  . antiseptic oral rinse  7 mL Mouth Rinse q12n4p  . cefTRIAXone (ROCEPHIN)  IV  2 g Intravenous Q24H  . feeding supplement (ENSURE COMPLETE)  237 mL Oral TID BM  . fentaNYL  25 mcg Transdermal Q72H  . lidocaine  1 patch Transdermal Q24H  . pantoprazole (PROTONIX) IV  40 mg Intravenous Q12H  . pregabalin  25 mg Oral QHS  . sodium chloride  10-40 mL Intracatheter Q12H   Infusions:  . dextrose 5 % and 0.45% NaCl 10 mL/hr at 09/20/14 2011   PRN: acetaminophen, fentaNYL, metoCLOPramide (REGLAN) injection, metoprolol, ondansetron (ZOFRAN) IV **OR** ondansetron (ZOFRAN) IV, sodium chloride  Assessment: 64 y/o F with recently diagnosed pancreatic adenocarcinoma admitted 12/25 with hemorrhagic shock secondary to GI bleed after recently taking ibuprofen. Patient also with sepsis due to Klebsiella bacteremia.  On 12/27 CT abdomen showed new main portal vein thrombus;  nonocclusive, possibly septic, but occupying a majority of the lumen.  Per MD assessment, patient is at risk of infarction unless anticoagulation started despite the risk of hemorrhage in setting of severe thrombocytopenia.  IV heparin infusion without bolus and with pharmacy dosing assistance was started at that time.  On 12/28 platelets decreased to 21k and anticoagulation was held.   In the interim, hemorraghic shock and sepsis have improved, platelets now up to 75k and on 09/22/14 Pharmacy is consulted re-start heparin gtt withOUT a bolus.  09/22/14:  Hgb 9.5, plts 75k  CrCl 61 ml/min  No more bleeding noted  Goal of Therapy:  Heparin level approximately 0.3 - 0.7 units/mL Monitor platelets by anticoagulation protocol: Yes   Plan:  - start heparin gtt at 800 units/hr with NO bolus per MD request - heparin level in 6 hours - daily heparin level and CBC - monitor for bleeding - follow up plans for long term anticoagulation - Pharmacy will follow up daily  Thank you for the consult.  Currie Paris, PharmD, BCPS Pager: [phone] Pharmacy: [phone] 09/22/2014 9:14 AM

## 2014-09-22 NOTE — Progress Notes (Signed)
Day shift RN 09/21/14 Reported to this RN that pt refused Fentanyl Patch

## 2014-09-22 NOTE — Progress Notes (Signed)
ANTICOAGULATION CONSULT NOTE - Follow-up Consult  Pharmacy Consult for Heparin Indication: portal vein thrombosis  No Known Allergies  Patient Measurements: Height: 5\' 3"  (160 cm) Weight: 123 lb 0.3 oz (55.8 kg) IBW/kg (Calculated) : 52.4   Vital Signs: Temp: 98.4 F (36.9 C) (01/02 1351) Temp Source: Oral (01/02 1351) BP: 107/75 mmHg (01/02 1351) Pulse Rate: 82 (01/02 1351)  Labs:  Recent Labs  09/20/14 1100 09/21/14 0510 09/22/14 0500 09/22/14 1600  HGB 10.8* 9.9* 9.5*  --   HCT 32.2* 30.3* 29.4*  --   PLT 63* 67* 75*  --   HEPARINUNFRC  --   --   --  <0.10*  CREATININE 0.50 0.57 0.51  --     Estimated Creatinine Clearance: 61.1 mL/min (by C-G formula based on Cr of 0.51).  Medications:  Scheduled:  . antiseptic oral rinse  7 mL Mouth Rinse q12n4p  . cefTRIAXone (ROCEPHIN)  IV  2 g Intravenous Q24H  . feeding supplement (ENSURE COMPLETE)  237 mL Oral TID BM  . fentaNYL  25 mcg Transdermal Q72H  . lidocaine  1 patch Transdermal Q24H  . pantoprazole (PROTONIX) IV  40 mg Intravenous Q12H  . pregabalin  25 mg Oral QHS  . sodium chloride  10-40 mL Intracatheter Q12H   Infusions:  . dextrose 5 % and 0.45% NaCl 10 mL/hr at 09/20/14 2011  . heparin 800 Units/hr (09/22/14 1003)   Assessment: 64 y/o F with recently diagnosed pancreatic adenocarcinoma admitted 12/25 with hemorrhagic shock secondary to GI bleed after recently taking ibuprofen. Patient also with sepsis due to Klebsiella bacteremia.  On 12/27 CT abdomen showed new main portal vein thrombus; nonocclusive, possibly septic, but occupying a majority of the lumen.  Per MD assessment, patient is at risk of infarction unless anticoagulation started despite the risk of hemorrhage in setting of severe thrombocytopenia.  IV heparin infusion without bolus and with pharmacy dosing assistance was started at that time.  On 12/28 platelets decreased to 21k and anticoagulation was held.   In the interim, hemorraghic  shock and sepsis have improved, platelets now up to 75k and on 09/22/14 Pharmacy is consulted re-start heparin gtt withOUT a bolus.  09/22/14:  Hgb 9.5, plts 75k  CrCl 61 ml/min  No more bleeding noted  Heparin level @ 1600 < 0.1 (SUBtherapeutic)-No issues reported per nursing  Goal of Therapy:  Heparin level 0.3 - 0.7 units/mL Monitor platelets by anticoagulation protocol: Yes   Plan:  - Increase heparin infusion to 1000 units/hr - NO bolus per MD request - Heparin level in 6 hours - Daily heparin level and CBC while on heparin infusion - Monitor for signs/symptoms of bleeding - Follow up plans for long term anticoagulation - Pharmacy will follow up daily  Thank you for the consult.  Lindell Spar, PharmD, BCPS Pager: [phone] 09/22/2014 5:47 PM

## 2014-09-22 NOTE — Progress Notes (Signed)
Patient ID: Melinda Conrad, female   DOB: 04/21/1951, 64 y.o.   MRN: [id number] 7 Days Post-Op  Subjective: No complaints this morning except her neck is a little sore.  Objective: Vital signs in last 24 hours: Temp:  [98.3 F (36.8 C)-99.2 F (37.3 C)] 99.2 F (37.3 C) (01/01 2150) Pulse Rate:  [61-73] 61 (01/01 2150) Resp:  [16] 16 (01/01 2150) BP: (125-138)/(69-89) 125/69 mmHg (01/01 2150) SpO2:  [95 %-97 %] 97 % (01/01 2150) Weight:  [123 lb 0.3 oz (55.8 kg)] 123 lb 0.3 oz (55.8 kg) (01/02 0500) Last BM Date: 09/20/14  Intake/Output from previous day: 01/01 0701 - 01/02 0700 In: 2000 [P.O.:600; I.V.:1400] Out: 3450 [Urine:3450] Intake/Output this shift:    General appearance: alert, cooperative and no distress Resp: clear to auscultation bilaterally GI: normal findings: soft, non-tender and small open area in incision clean  and no drainage  Lab Results:   Recent Labs  09/21/14 0510 09/22/14 0500  WBC 8.9 7.1  HGB 9.9* 9.5*  HCT 30.3* 29.4*  PLT 67* 75*   BMET  Recent Labs  09/21/14 0510 09/22/14 0500  NA 136 136  K 2.9* 4.1  CL 107 105  CO2 24 26  GLUCOSE 99 95  BUN 12 10  CREATININE 0.57 0.51  CALCIUM 7.3* 7.5*     Studies/Results: Dg Chest Port 1 View  09/21/2014   CLINICAL DATA:  Infiltrates, shortness of breath.  EXAM: PORTABLE CHEST - 1 VIEW  COMPARISON:  09/19/2014  FINDINGS: Right central line remains in place, unchanged. Severe diffuse bilateral pulmonary airspace opacities are again noted, unchanged. Heart is normal size. No effusions or acute bony abnormality.  IMPRESSION: Severe diffuse bilateral airspace disease, unchanged.   Electronically Signed   By: Rolm Baptise M.D.   On: 09/21/2014 07:37    Anti-infectives: Anti-infectives    Start     Dose/Rate Route Frequency Ordered Stop   09/17/14 1200  cefTRIAXone (ROCEPHIN) 2 g in dextrose 5 % 50 mL IVPB - Premix     2 g100 mL/hr over 30 Minutes Intravenous Every 24 hours 09/17/14 1013     09/15/14 1800  piperacillin-tazobactam (ZOSYN) IVPB 3.375 g  Status:  Discontinued     3.375 g12.5 mL/hr over 240 Minutes Intravenous Every 8 hours 09/15/14 1741 09/17/14 1013   09/14/14 2245  cefTRIAXone (ROCEPHIN) 1 g in dextrose 5 % 50 mL IVPB  Status:  Discontinued     1 g100 mL/hr over 30 Minutes Intravenous Every 24 hours 09/14/14 2238 09/15/14 1730      Assessment/Plan: Status post Whipple procedure for T3 N0 pancreatic cancer Klebsiella bacteremia-no sign of infection-on Rocephin IV Overall improving, eating better Thrombocytopenia improved. Portal vein thrombosis, anticoagulation held due to GI bleed. No sign of bleeding. Discussed with Dr. Barry Dienes. We will start IV heparin.    LOS: 8 days    Melinda Conrad T 09/22/2014

## 2014-09-22 NOTE — Progress Notes (Signed)
[facility] PROGRESS NOTE  Melinda Conrad FXT:[id number] DOB: [birth year] DOA: 09/14/2014 PCP: Kevan Ny, MD  Brief History 64 y/o female with history of pancreatitis, hypertension, pancreatic cancer with prior Whipple surgery (08/07/14) with preoperative radiation and chemotherapy. The patient presented to the hospital on 09/14/2014 with several days of melanotic stools, fever up to 104.25F, and hematemesis. The patient was found to be lethargic and hypotensive in the emergency department even with fluid resuscitation and PRBC infusion. She was admitted to the ICU under the critical care service, and she was started on Levophed. On admission, hemoglobin was 5.8. 12/25 CT abd/pelvis showed two ovoid high-density lesions adjacent to the pancreatic stent within the jejunum could represent hemorrhage or hematoma. No additional gross evidence of surgical complication. Gastroenterology was consulted.the patient was placed on an IV Protonix drip. It was thought that the patient's GI bleed was due to chronic NSAID use vs ischemia induced from her hypotension. EGD was deferred because of the patient's persistent hypotension. Blood cultures performed at the time of admission revealed Klebsiella pneumoniae. The patient was initially started on Zosyn, but this was narrowed to ceftriaxone. The patient continued to have persistent abdominal pain. Repeat CT of the abdomen and pelvis with contrast on 09/16/2014 was performed. It revealed a new portal vein thrombus, bilateral pleural effusions with bibasilar infiltrates . There was no abscess or leakage at her surgical sites. Gen. surgery was consulted. After careful consideration amongst all consultants due to the patient's recent GI bleed, the patient was started on a heparin drip. The patient continued to have progressive thrombocytopenia. Heme/Onc, Dr. Benay Spice was consulted. The patient's heparin drip was subsequently held with the hopes that with her  thrombocytopenia will improved with treatment for sepsis.  Assessment/Plan: #1 severe sepsis Patient was admitted with sepsis felt to be secondary to her bacteremia. Patient improving clinically. Afebrile. Patient off pressors. Blood cultures with Klebsiella pneumonia likely seeded from the GI tract. Patient currently on IV Rocephin. Follow.  #2 Klebsiella pneumonia bacteremia Likely seeded from the GI source. 12/30 surveillance blood cultures in the setting of portal vein thromboses were negative at 24 hours still pending. Continue IV Rocephin. May need to treat for total of 2 weeks.  #3 hemorrhagic shock/acute blood loss anemia Likely secondary to chronic NSAID use. Patient with no further bleeding today. Patient status post 4 units packed red blood cells. Hemoglobin currently stable. Follow.Continue PPI BID. GI was following and recommend monitoring at this point in time.  #4 portal vein thrombosis Anticoagulation held secondary to significant thrombocytopenia. Platelet count improving. Patient has been started empirically on IV heparin per general surgery as platelet counts have improved. Monitor closely for bleeding and follow platelet counts.   #5 acute respiratory failure Concern for TRALI secondary to transfusions versus acute lung injury from bacteremia. Patient satting well. Patient hemodynamically stable. Patients with some shortness of breath however states improving. Patient diuresed 2.2 L yesterday and is currently +6 L since admission. Patient received 40 mg of IV Lasix on 12/29, 12/30, 12/31. Will give a dose of Lasix 40 mg IV 1 today. Follow. Continue empiric IV antibiotics.  #6 thrombocytopenia Likely secondary to sepsis with possible DIC and consumption. Platelet function showing recovery and improving. No schistocytes on peripheral smear. Patient may have had low-grade DIC in the setting of sepsis and mild quite a lot of the. Follow.  #7 acute kidney injury Secondary to  sepsis and pre-azotemia secondary to hypotension. Improved and currently at baseline. Monitor with diuresis.  #8  severe protein calorie malnutrition Patient has been seen by dietitian. Continue current supplements. Diet has been advanced. Follow.  #9 pancreatic adenocarcinoma Status post Whipple's with preoperative XRT and chemotherapy. Patient has been seen by Dr. Benay Spice of oncology and chemotherapy on hold for now. Outpatient follow-up with oncology.  #10 hypokalemia/hypomagnesemia Likely secondary to diuretics. Repleted. Keep magnesium greater than 2. Keep potassium greater than 4.  #11 deconditioning/debility Patient has been seen by physical therapy recommended home health PT.  #12 prophylaxis Protonix for GI prophylaxis. SCDs for DVT prophylaxis.    Code Status: Full Family Communication: Updated patient no family present. Disposition Plan: Home with home health when medically stable.   Consultants:  Palliative care Dr. Hilma Favors 09/19/2014  Gastroenterology Dr. Deatra Ina 09/15/2014  Hematology/oncology Dr. Benay Spice: 09/17/2014  Procedures: R IJ CVL 12/25 >>  12/25 CT abd/pelvis: Two ovoid high-density lesions adjacent to the pancreatic stent within the jejunum could represent hemorrhage or hematoma. No additional gross evidence of surgical complication on this non IV and oral contrast exam. Potential small additional clot within the stomach is indeterminate 09/16/2014 CT abdomen and pelvis:Changes consistent with prior Whipple procedure with pancreatic stent in place. Some postoperative free fluid is noted although no organized collection to suggest abscess is noted at this time.  New main portal vein thrombus which is nonocclusive but occupies a majority of the lumen of the main portal vein. There is some differential enhancement of the liver identified which in part is likely related to this thrombus and timing of the contrast bolus.  New bilateral pleural  effusions with bibasilar infiltrates particularly in the right middle lobe. This may be the etiology of the patient's elevated white blood cell count. These are new from the prior exam from 09/14/2014.  Changes of anasarca.  12/25 Admitted with UGIB to ICU. 2 units PRBCs ordered Chest x-ray 09/14/2014, 09/17/2014, 09/19/2014, 09/21/2014 KUB 09/16/2014 2-D echo 09/21/2014  Antibiotics: Ceftriaxone 12/25 >> 12/26 Zosyn 12/26 >>12/28 Rocephin 12/28>>>  HPI/Subjective: Patient states she feels better. Patient denies any acute bleeding. Patient states tolerating diet. Patient still states some shortness of breath however improving. Patient states some abdominal pain however improving.   Objective: Filed Vitals:   09/21/14 2150  BP: 125/69  Pulse: 61  Temp: 99.2 F (37.3 C)  Resp: 16    Intake/Output Summary (Last 24 hours) at 09/22/14 1012 Last data filed at 09/22/14 1003  Gross per 24 hour  Intake 1920.5 ml  Output   3250 ml  Net -1329.5 ml   Filed Weights   09/20/14 0550 09/21/14 0550 09/22/14 0500  Weight: 59.8 kg (131 lb 13.4 oz) 57.4 kg (126 lb 8.7 oz) 55.8 kg (123 lb 0.3 oz)    Exam:   General:  NAD  Cardiovascular: RRR  Respiratory: Bibasilar crackles  Abdomen: Soft, some diffuse tenderness to palpation, positive bowel sounds, no rebound, no guarding.  Musculoskeletal: No clubbing cyanosis or edema.  Data Reviewed: Basic Metabolic Panel:  Recent Labs Lab 09/16/14 0425 09/16/14 2000 09/17/14 0230 09/18/14 0520 09/19/14 0500 09/20/14 1100 09/21/14 0510 09/22/14 0500  NA 138  --  140 139 138 139 136 136  K 2.9*  --  3.1* 3.9 3.7 3.0* 2.9* 4.1  CL 118*  --  120* 117* 116* 113* 107 105  CO2 17*  --  17* 17* 19 20 24 26   GLUCOSE 163*  --  105* 149* 115* 74 99 95  BUN 34*  --  24* 20 20 16 12 10   CREATININE 0.96  --  0.69 0.51 0.58 0.50 0.57 0.51  CALCIUM 7.2*  --  7.3* 7.6* 7.5* 7.4* 7.3* 7.5*  MG 1.3* 2.6* 2.4 1.9  --   --  1.3* 1.8  PHOS  1.5*  --   --  4.9*  --   --   --   --    Liver Function Tests:  Recent Labs Lab 09/18/14 0520 09/19/14 0500  AST 37 36  ALT 16 14  ALKPHOS 182* 170*  BILITOT 1.0 0.8  PROT 5.7* 5.9*  ALBUMIN 1.4* 1.4*   No results for input(s): LIPASE, AMYLASE in the last 168 hours. No results for input(s): AMMONIA in the last 168 hours. CBC:  Recent Labs Lab 09/18/14 0520 09/19/14 0500 09/20/14 1100 09/21/14 0510 09/22/14 0500  WBC 11.7* 12.1* 10.1 8.9 7.1  HGB 11.9* 11.0* 10.8* 9.9* 9.5*  HCT 35.0* 32.1* 32.2* 30.3* 29.4*  MCV 85.0 84.7 86.3 87.6 88.8  PLT 23* 41* 63* 67* 75*   Cardiac Enzymes: No results for input(s): CKTOTAL, CKMB, CKMBINDEX, TROPONINI in the last 168 hours. BNP (last 3 results) No results for input(s): PROBNP in the last 8760 hours. CBG: No results for input(s): GLUCAP in the last 168 hours.  Recent Results (from the past 240 hour(s))  Culture, blood (routine x 2)     Status: None   Collection Time: 09/15/14 12:10 AM  Result Value Ref Range Status   Specimen Description BLOOD R ARM  Final   Special Requests BOTTLES DRAWN AEROBIC ONLY 3CC  Final   Culture   Final    KLEBSIELLA PNEUMONIAE Note: SUSCEPTIBILITIES PERFORMED ON PREVIOUS CULTURE WITHIN THE LAST 5 DAYS. Note: Gram Stain Report Called to,Read Back By and Verified With: STEPHANIE DILLON 09/15/14 @ 5:03PM BY RUSCOE A. Performed at [facility]    Report Status 09/17/2014 FINAL  Final  MRSA PCR Screening     Status: None   Collection Time: 09/15/14 12:27 AM  Result Value Ref Range Status   MRSA by PCR NEGATIVE NEGATIVE Final    Comment:        The GeneXpert MRSA Assay (FDA approved for NASAL specimens only), is one component of a comprehensive MRSA colonization surveillance program. It is not intended to diagnose MRSA infection nor to guide or monitor treatment for MRSA infections.   Culture, blood (routine x 2)     Status: None   Collection Time: 09/15/14 12:38 AM  Result Value  Ref Range Status   Specimen Description BLOOD R IS CVC  Final   Special Requests BOTTLES DRAWN AEROBIC AND ANAEROBIC 5CC EACH  Final   Culture   Final    KLEBSIELLA PNEUMONIAE Note: Gram Stain Report Called to,Read Back By and Verified With: STEPHANIE DILLON 09/15/14 @ 5:03PM BY RUSCOE A Performed at [facility]    Report Status 09/17/2014 FINAL  Final   Organism ID, Bacteria KLEBSIELLA PNEUMONIAE  Final      Susceptibility   Klebsiella pneumoniae - MIC*    AMPICILLIN RESISTANT      AMPICILLIN/SULBACTAM 8 SENSITIVE Sensitive     CEFAZOLIN <=4 SENSITIVE Sensitive     CEFEPIME <=1 SENSITIVE Sensitive     CEFTAZIDIME <=1 SENSITIVE Sensitive     CEFTRIAXONE <=1 SENSITIVE Sensitive     CIPROFLOXACIN <=0.25 SENSITIVE Sensitive     GENTAMICIN <=1 SENSITIVE Sensitive     IMIPENEM <=0.25 SENSITIVE Sensitive     PIP/TAZO <=4 SENSITIVE Sensitive     TOBRAMYCIN <=1 SENSITIVE Sensitive     TRIMETH/SULFA <=  20 SENSITIVE Sensitive     * KLEBSIELLA PNEUMONIAE  Culture, blood (routine x 2)     Status: None (Preliminary result)   Collection Time: 09/19/14  2:50 PM  Result Value Ref Range Status   Specimen Description BLOOD RIGHT ARM  Final   Special Requests BOTTLES DRAWN AEROBIC ONLY 6CC  Final   Culture   Final           BLOOD CULTURE RECEIVED NO GROWTH TO DATE CULTURE WILL BE HELD FOR 5 DAYS BEFORE ISSUING A FINAL NEGATIVE REPORT Performed at [facility]    Report Status PENDING  Incomplete  Culture, blood (routine x 2)     Status: None (Preliminary result)   Collection Time: 09/19/14  3:05 PM  Result Value Ref Range Status   Specimen Description BLOOD RIGHT ARM  Final   Special Requests BOTTLES DRAWN AEROBIC ONLY 4CC  Final   Culture   Final           BLOOD CULTURE RECEIVED NO GROWTH TO DATE CULTURE WILL BE HELD FOR 5 DAYS BEFORE ISSUING A FINAL NEGATIVE REPORT Performed at [facility]    Report Status PENDING  Incomplete     Studies: Dg Chest Port 1  View  09/22/2014   CLINICAL DATA:  Sepsis and status post Whipple procedure to resect pancreatic head carcinoma.  EXAM: PORTABLE CHEST - 1 VIEW  COMPARISON:  09/21/2014  FINDINGS: Right-sided jugular central line tip is stable and positioned within the SVC. Lungs show marginally improved aeration bilaterally with evidence of persistent predominantly interstitial pattern of infiltrates bilaterally. No overt airspace edema or pleural fluid identified. No pneumothorax. The heart size and mediastinal contours remain normal.  IMPRESSION: Slightly improved aeration bilaterally. Persistent interstitial pattern of infiltrates bilaterally.   Electronically Signed   By: Aletta Edouard M.D.   On: 09/22/2014 10:01   Dg Chest Port 1 View  09/21/2014   CLINICAL DATA:  Infiltrates, shortness of breath.  EXAM: PORTABLE CHEST - 1 VIEW  COMPARISON:  09/19/2014  FINDINGS: Right central line remains in place, unchanged. Severe diffuse bilateral pulmonary airspace opacities are again noted, unchanged. Heart is normal size. No effusions or acute bony abnormality.  IMPRESSION: Severe diffuse bilateral airspace disease, unchanged.   Electronically Signed   By: Rolm Baptise M.D.   On: 09/21/2014 07:37    Scheduled Meds: . antiseptic oral rinse  7 mL Mouth Rinse q12n4p  . cefTRIAXone (ROCEPHIN)  IV  2 g Intravenous Q24H  . feeding supplement (ENSURE COMPLETE)  237 mL Oral TID BM  . fentaNYL  25 mcg Transdermal Q72H  . lidocaine  1 patch Transdermal Q24H  . magnesium sulfate 1 - 4 g bolus IVPB  2 g Intravenous Once  . pantoprazole (PROTONIX) IV  40 mg Intravenous Q12H  . pregabalin  25 mg Oral QHS  . sodium chloride  10-40 mL Intracatheter Q12H   Continuous Infusions: . dextrose 5 % and 0.45% NaCl 10 mL/hr at 09/20/14 2011  . heparin 800 Units/hr (09/22/14 1003)    Principal Problem:   Septic shock Active Problems:   Hemorrhagic shock   Depression   Protein-calorie malnutrition, severe   Acute GI bleeding    Thrombocytopenia   Portal vein thrombosis   Sepsis   Acute respiratory failure with hypoxia   Hypokalemia   Hypomagnesemia    Time spent: 40 mins    [facility] MD [facility] Pager [phone]. If 7PM-7AM, please contact night-coverage at [email or web address], password [facility] 09/22/2014,  10:12 AM  LOS: 8 days

## 2014-09-23 DIAGNOSIS — R7881 Bacteremia: Secondary | ICD-10-CM | POA: Diagnosis present

## 2014-09-23 LAB — BASIC METABOLIC PANEL
ANION GAP: 4 — AB (ref 5–15)
BUN: 10 mg/dL (ref 6–23)
CALCIUM: 7.4 mg/dL — AB (ref 8.4–10.5)
CO2: 28 mmol/L (ref 19–32)
Chloride: 102 mEq/L (ref 96–112)
Creatinine, Ser: 0.63 mg/dL (ref 0.50–1.10)
GFR calc Af Amer: >90 mL/min (ref >90)
Glucose, Bld: 79 mg/dL (ref 70–99)
Potassium: 4.1 mmol/L (ref 3.5–5.1)
Sodium: 134 mmol/L — ABNORMAL LOW (ref 135–145)

## 2014-09-23 LAB — CBC
HCT: 30.5 % — ABNORMAL LOW (ref 36.0–46.0)
Hemoglobin: 9.8 g/dL — ABNORMAL LOW (ref 12.0–15.0)
MCH: 29 pg (ref 26.0–34.0)
MCHC: 32.1 g/dL (ref 30.0–36.0)
MCV: 90.2 fL (ref 78.0–100.0)
PLATELETS: 86 10*3/uL — AB (ref 150–400)
RBC: 3.38 MIL/uL — AB (ref 3.87–5.11)
RDW: 15.5 % (ref 11.5–15.5)
WBC: 5.9 10*3/uL (ref 4.0–10.5)

## 2014-09-23 LAB — HEPARIN LEVEL (UNFRACTIONATED)
HEPARIN UNFRACTIONATED: 0.17 [IU]/mL — AB (ref 0.30–0.70)
Heparin Unfractionated: 0.27 IU/mL — ABNORMAL LOW (ref 0.30–0.70)
Heparin Unfractionated: 0.51 IU/mL (ref 0.30–0.70)
Heparin Unfractionated: 0.49 IU/mL (ref 0.30–0.70)

## 2014-09-23 LAB — URINE CULTURE
Colony Count: NO GROWTH
Culture: NO GROWTH

## 2014-09-23 MED ORDER — HEPARIN (PORCINE) IN NACL 100-0.45 UNIT/ML-% IJ SOLN
1150.0000 [IU]/h | INTRAMUSCULAR | Status: DC
  Administered 2014-09-23: 1150 [IU]/h via INTRAVENOUS
  Filled 2014-09-23 (×2): qty 250

## 2014-09-23 MED ORDER — HEPARIN (PORCINE) IN NACL 100-0.45 UNIT/ML-% IJ SOLN
1250.0000 [IU]/h | INTRAMUSCULAR | Status: AC
  Administered 2014-09-24: 1250 [IU]/h via INTRAVENOUS
  Filled 2014-09-23 (×6): qty 250

## 2014-09-23 NOTE — Progress Notes (Signed)
Patient ID: Melinda Conrad, female   DOB: 1951/08/27, 64 y.o.   MRN: [id number] 8 Days Post-Op  Subjective: No complaints. Eating better. Denies abdominal pain. Asking about going home.  Objective: Vital signs in last 24 hours: Temp:  [98.4 F (36.9 C)-99.1 F (37.3 C)] 98.4 F (36.9 C) (01/03 0550) Pulse Rate:  [67-82] 67 (01/03 0550) Resp:  [16] 16 (01/03 0550) BP: (104-116)/(61-78) 104/61 mmHg (01/03 0550) SpO2:  [100 %] 100 % (01/03 0550) Weight:  [124 lb 5.4 oz (56.4 kg)] 124 lb 5.4 oz (56.4 kg) (01/03 0500) Last BM Date: 09/23/14  Intake/Output from previous day: 01/02 0701 - 01/03 0700 In: 1120.7 [P.O.:720; I.V.:300.7; IV Piggyback:100] Out: 3150 [Urine:3150] Intake/Output this shift:    General appearance: alert, cooperative and no distress Resp: no wheezing or increased work of breathing GI: normal findings: soft, non-tender  Lab Results:   Recent Labs  09/22/14 0500 09/23/14 0653  WBC 7.1 5.9  HGB 9.5* 9.8*  HCT 29.4* 30.5*  PLT 75* 86*   BMET  Recent Labs  09/21/14 0510 09/22/14 0500  NA 136 136  K 2.9* 4.1  CL 107 105  CO2 24 26  GLUCOSE 99 95  BUN 12 10  CREATININE 0.57 0.51  CALCIUM 7.3* 7.5*     Studies/Results: Dg Chest Port 1 View  09/22/2014   CLINICAL DATA:  Sepsis and status post Whipple procedure to resect pancreatic head carcinoma.  EXAM: PORTABLE CHEST - 1 VIEW  COMPARISON:  09/21/2014  FINDINGS: Right-sided jugular central line tip is stable and positioned within the SVC. Lungs show marginally improved aeration bilaterally with evidence of persistent predominantly interstitial pattern of infiltrates bilaterally. No overt airspace edema or pleural fluid identified. No pneumothorax. The heart size and mediastinal contours remain normal.  IMPRESSION: Slightly improved aeration bilaterally. Persistent interstitial pattern of infiltrates bilaterally.   Electronically Signed   By: Aletta Edouard M.D.   On: 09/22/2014 10:01     Anti-infectives: Anti-infectives    Start     Dose/Rate Route Frequency Ordered Stop   09/17/14 1200  cefTRIAXone (ROCEPHIN) 2 g in dextrose 5 % 50 mL IVPB - Premix     2 g100 mL/hr over 30 Minutes Intravenous Every 24 hours 09/17/14 1013     09/15/14 1800  piperacillin-tazobactam (ZOSYN) IVPB 3.375 g  Status:  Discontinued     3.375 g12.5 mL/hr over 240 Minutes Intravenous Every 8 hours 09/15/14 1741 09/17/14 1013   09/14/14 2245  cefTRIAXone (ROCEPHIN) 1 g in dextrose 5 % 50 mL IVPB  Status:  Discontinued     1 g100 mL/hr over 30 Minutes Intravenous Every 24 hours 09/14/14 2238 09/15/14 1730      Assessment/Plan: Status post Whipple for T3 N1 adenocarcinoma Admitted with GI bleed, portal vein thrombosis and Klebsiella bacteremia Thrombocytopenia on admission which has improved. IV heparin started for portal vein thrombosis. On IV Rocephin. Seems to be improving daily.    LOS: 9 days    Devan Babino T 09/23/2014

## 2014-09-23 NOTE — Progress Notes (Signed)
ANTICOAGULATION CONSULT NOTE - Follow-up Consult  Pharmacy Consult for Heparin Indication: portal vein thrombosis  No Known Allergies  Patient Measurements: Height: 5\' 3"  (160 cm) Weight: 123 lb 0.3 oz (55.8 kg) IBW/kg (Calculated) : 52.4   Vital Signs: Temp: 99.1 F (37.3 C) (01/02 2143) Temp Source: Oral (01/02 2143) BP: 116/78 mmHg (01/02 2143) Pulse Rate: 77 (01/02 2143)  Labs:  Recent Labs  09/20/14 1100 09/21/14 0510 09/22/14 0500 09/22/14 1600 09/23/14 0003  HGB 10.8* 9.9* 9.5*  --   --   HCT 32.2* 30.3* 29.4*  --   --   PLT 63* 67* 75*  --   --   HEPARINUNFRC  --   --   --  <0.10* 0.17*  CREATININE 0.50 0.57 0.51  --   --     Estimated Creatinine Clearance: 61.1 mL/min (by C-G formula based on Cr of 0.51).  Medications:  Scheduled:  . antiseptic oral rinse  7 mL Mouth Rinse q12n4p  . cefTRIAXone (ROCEPHIN)  IV  2 g Intravenous Q24H  . feeding supplement (ENSURE COMPLETE)  237 mL Oral TID BM  . fentaNYL  25 mcg Transdermal Q72H  . lidocaine  1 patch Transdermal Q24H  . pantoprazole (PROTONIX) IV  40 mg Intravenous Q12H  . pregabalin  25 mg Oral QHS  . sodium chloride  10-40 mL Intracatheter Q12H   Infusions:  . dextrose 5 % and 0.45% NaCl 10 mL/hr at 09/20/14 2011  . heparin 1,000 Units/hr (09/22/14 1807)   Assessment: 64 y/o F with recently diagnosed pancreatic adenocarcinoma admitted 12/25 with hemorrhagic shock secondary to GI bleed after recently taking ibuprofen. Patient also with sepsis due to Klebsiella bacteremia.  On 12/27 CT abdomen showed new main portal vein thrombus; nonocclusive, possibly septic, but occupying a majority of the lumen.  Per MD assessment, patient is at risk of infarction unless anticoagulation started despite the risk of hemorrhage in setting of severe thrombocytopenia.  IV heparin infusion without bolus and with pharmacy dosing assistance was started at that time.  On 12/28 platelets decreased to 21k and anticoagulation  was held.   In the interim, hemorraghic shock and sepsis have improved, platelets now up to 75k and on 09/22/14 Pharmacy is consulted re-start heparin gtt withOUT a bolus.  09/22/14:  Hgb 9.5, plts 75k  CrCl 61 ml/min  Heparin level = 0.17 at 1000 units/hr (SUBtherapeutic)  No complications of therapy noted  Goal of Therapy:  Heparin level 0.3 - 0.7 units/mL Monitor platelets by anticoagulation protocol: Yes   Plan:  - Increase heparin infusion to 1150 units/hr - NO bolus per MD request - Heparin level in 6 hours - Daily heparin level and CBC while on heparin infusion - Monitor for signs/symptoms of bleeding - Follow up plans for long term anticoagulation - Pharmacy will follow up daily  Leone Haven, PharmD 09/22/14 @ 00:38

## 2014-09-23 NOTE — Progress Notes (Signed)
ANTICOAGULATION CONSULT NOTE - Follow-up Consult  Pharmacy Consult for Heparin Indication: portal vein thrombosis  No Known Allergies  Patient Measurements: Height: 5\' 3"  (160 cm) Weight: 124 lb 5.4 oz (56.4 kg) IBW/kg (Calculated) : 52.4   Vital Signs: Temp: 98.4 F (36.9 C) (01/03 0550) Temp Source: Oral (01/03 0550) BP: 104/61 mmHg (01/03 0550) Pulse Rate: 67 (01/03 0550)  Labs:  Recent Labs  09/20/14 1100 09/21/14 0510 09/22/14 0500 09/22/14 1600 09/23/14 0003 09/23/14 0653  HGB 10.8* 9.9* 9.5*  --   --  9.8*  HCT 32.2* 30.3* 29.4*  --   --  30.5*  PLT 63* 67* 75*  --   --  86*  HEPARINUNFRC  --   --   --  <0.10* 0.17* 0.27*  CREATININE 0.50 0.57 0.51  --   --   --     Estimated Creatinine Clearance: 61.1 mL/min (by C-G formula based on Cr of 0.51).  Medications:   Infusions:  . dextrose 5 % and 0.45% NaCl 10 mL/hr at 09/20/14 2011  . heparin 1,150 Units/hr (09/23/14 0725)   Assessment: 64 y/o F with recently diagnosed pancreatic adenocarcinoma admitted 12/25 with hemorrhagic shock secondary to GI bleed after recently taking ibuprofen. Patient also with sepsis due to Klebsiella bacteremia on ceftriaxone  On 12/27 CT abdomen showed new main portal vein thrombus; nonocclusive, possibly septic, but occupying a majority of the lumen.  Per MD assessment, patient is at risk of infarction unless anticoagulation started despite the risk of hemorrhage in setting of severe thrombocytopenia.  IV heparin infusion without bolus and with pharmacy dosing assistance was started at that time.  On 12/28 platelets decreased to 21k and anticoagulation was held.   In the interim, hemorraghic shock and sepsis have improved, platelets now up to 75k and on 09/22/14 Pharmacy is consulted re-start heparin gtt withOUT a bolus.  Today, 09/23/2014:  Heparin level = 0.27 at 1150 units/hr (SUBtherapeutic)  No complications of therapy noted  Hgb 9.8, pltc 86k and improving  Goal of  Therapy:  Heparin level 0.3 - 0.7 units/mL Monitor platelets by anticoagulation protocol: Yes   Plan:  - Increase heparin infusion to 1250 units/hr - NO bolus per MD request - Heparin level in 6 hours - Daily heparin level and CBC while on heparin infusion - Monitor for signs/symptoms of bleeding - Follow up plans for long term anticoagulation (? LMWH with solid-tumor malignancy)  Doreene Eland, PharmD, BCPS.   Pager: [phone] 09/23/2014 7:58 AM

## 2014-09-23 NOTE — Progress Notes (Signed)
[facility] PROGRESS NOTE  Melinda Conrad IWL:[id number] DOB: 1951-08-30 DOA: 09/14/2014 PCP: Kevan Ny, MD  Brief History 64 y/o female with history of pancreatitis, hypertension, pancreatic cancer with prior Whipple surgery (08/07/14) with preoperative radiation and chemotherapy. The patient presented to the hospital on 09/14/2014 with several days of melanotic stools, fever up to 104.44F, and hematemesis. The patient was found to be lethargic and hypotensive in the emergency department even with fluid resuscitation and PRBC infusion. She was admitted to the ICU under the critical care service, and she was started on Levophed. On admission, hemoglobin was 5.8. 12/25 CT abd/pelvis showed two ovoid high-density lesions adjacent to the pancreatic stent within the jejunum could represent hemorrhage or hematoma. No additional gross evidence of surgical complication. Gastroenterology was consulted.the patient was placed on an IV Protonix drip. It was thought that the patient's GI bleed was due to chronic NSAID use vs ischemia induced from her hypotension. EGD was deferred because of the patient's persistent hypotension. Blood cultures performed at the time of admission revealed Klebsiella pneumoniae. The patient was initially started on Zosyn, but this was narrowed to ceftriaxone. The patient continued to have persistent abdominal pain. Repeat CT of the abdomen and pelvis with contrast on 09/16/2014 was performed. It revealed a new portal vein thrombus, bilateral pleural effusions with bibasilar infiltrates . There was no abscess or leakage at her surgical sites. Gen. surgery was consulted. After careful consideration amongst all consultants due to the patient's recent GI bleed, the patient was started on a heparin drip. The patient continued to have progressive thrombocytopenia. Heme/Onc, Dr. Benay Spice was consulted. The patient's heparin drip was subsequently held with the hopes that with her  thrombocytopenia will improved with treatment for sepsis. Thrombocytopenia improving. Patient has been started on heparin per CCS.  Assessment/Plan: #1 severe sepsis Patient was admitted with sepsis felt to be secondary to her bacteremia. Patient improving clinically. Afebrile. Patient off pressors. Blood cultures with Klebsiella pneumonia likely seeded from the GI tract. Patient currently on IV Rocephin. On D/C will change to oral augmentin and treat for additional 3 weeks. Follow.  #2 Klebsiella pneumonia bacteremia Likely seeded from the GI source. 12/30 surveillance blood cultures in the setting of portal vein thromboses were negative at 24 hours still pending. Continue IV Rocephin. On discharge will change to oral augmentin and treat for 3 more weeks. Discussed with ID.  #3 hemorrhagic shock/acute blood loss anemia Likely secondary to chronic NSAID use. Patient with no further bleeding today. Patient status post 4 units packed red blood cells. Hemoglobin currently stable. Follow.Continue PPI BID. GI was following and recommend monitoring at this point in time.  #4 portal vein thrombosis Anticoagulation held secondary to significant thrombocytopenia. Platelet count improving. Patient has been started empirically on IV heparin per general surgery as platelet counts have improved. Monitor closely for bleeding and follow platelet counts.   #5 acute respiratory failure Concern for TRALI secondary to transfusions versus acute lung injury from bacteremia. Patient satting well. Patient hemodynamically stable. Patients with some shortness of breath however states improving. Patient diuresed 2.2 L yesterday and is currently +6 L since admission. Patient received 40 mg of IV Lasix on 12/29, 12/30, 12/31. Hold lasix today. Follow. Continue empiric IV antibiotics.  #6 thrombocytopenia Likely secondary to sepsis with possible DIC and consumption. Platelet function showing recovery and improving. No  schistocytes on peripheral smear. Patient may have had low-grade DIC in the setting of sepsis and mild quite a lot of the. Follow.  #  7 acute kidney injury Secondary to sepsis and pre-azotemia secondary to hypotension. Improved and currently at baseline. Monitor with diuresis.  #8 severe protein calorie malnutrition Patient has been seen by dietitian. Continue current supplements. Diet has been advanced. Follow.  #9 pancreatic adenocarcinoma Status post Whipple's with preoperative XRT and chemotherapy. Patient has been seen by Dr. Benay Spice of oncology and chemotherapy on hold for now. Outpatient follow-up with oncology.  #10 hypokalemia/hypomagnesemia Likely secondary to diuretics. Repleted. Keep magnesium greater than 2. Keep potassium greater than 4.  #11 deconditioning/debility Patient has been seen by physical therapy recommended home health PT.  #12 prophylaxis Protonix for GI prophylaxis. SCDs for DVT prophylaxis.    Code Status: Full Family Communication: Updated patient no family present. Disposition Plan: Home with home health when medically stable.   Consultants:  Palliative care Dr. Hilma Favors 09/19/2014  Gastroenterology Dr. Deatra Ina 09/15/2014  Hematology/oncology Dr. Benay Spice: 09/17/2014  Procedures: R IJ CVL 12/25 >>  12/25 CT abd/pelvis: Two ovoid high-density lesions adjacent to the pancreatic stent within the jejunum could represent hemorrhage or hematoma. No additional gross evidence of surgical complication on this non IV and oral contrast exam. Potential small additional clot within the stomach is indeterminate 09/16/2014 CT abdomen and pelvis:Changes consistent with prior Whipple procedure with pancreatic stent in place. Some postoperative free fluid is noted although no organized collection to suggest abscess is noted at this time.  New main portal vein thrombus which is nonocclusive but occupies a majority of the lumen of the main portal vein. There is  some differential enhancement of the liver identified which in part is likely related to this thrombus and timing of the contrast bolus.  New bilateral pleural effusions with bibasilar infiltrates particularly in the right middle lobe. This may be the etiology of the patient's elevated white blood cell count. These are new from the prior exam from 09/14/2014.  Changes of anasarca.  12/25 Admitted with UGIB to ICU. 2 units PRBCs ordered Chest x-ray 09/14/2014, 09/17/2014, 09/19/2014, 09/21/2014 KUB 09/16/2014 2-D echo 09/21/2014  Antibiotics: Ceftriaxone 12/25 >> 12/26 Zosyn 12/26 >>12/28 Rocephin 12/28>>>  HPI/Subjective: Patient states she feels better. Patient denies any acute bleeding. Patient states tolerating diet. Patient still states some shortness of breath however improving. Patient states some abdominal pain however improving.   Objective: Filed Vitals:   09/23/14 1400  BP: 110/64  Pulse: 72  Temp: 98 F (36.7 C)  Resp: 16    Intake/Output Summary (Last 24 hours) at 09/23/14 1442 Last data filed at 09/23/14 1400  Gross per 24 hour  Intake 1919.9 ml  Output    550 ml  Net 1369.9 ml   Filed Weights   09/21/14 0550 09/22/14 0500 09/23/14 0500  Weight: 57.4 kg (126 lb 8.7 oz) 55.8 kg (123 lb 0.3 oz) 56.4 kg (124 lb 5.4 oz)    Exam:   General:  NAD  Cardiovascular: RRR  Respiratory: Bibasilar crackles.  Abdomen: Soft, some diffuse tenderness to palpation, positive bowel sounds, no rebound, no guarding.  Musculoskeletal: No clubbing cyanosis or edema.  Data Reviewed: Basic Metabolic Panel:  Recent Labs Lab 09/16/14 2000  09/17/14 0230 09/18/14 0520 09/19/14 0500 09/20/14 1100 09/21/14 0510 09/22/14 0500 09/23/14 0653  NA  --   < > 140 139 138 139 136 136 134*  K  --   < > 3.1* 3.9 3.7 3.0* 2.9* 4.1 4.1  CL  --   < > 120* 117* 116* 113* 107 105 102  CO2  --   < >  17* 17* 19 20 24 26 28   GLUCOSE  --   < > 105* 149* 115* 74 99 95 79   BUN  --   < > 24* 20 20 16 12 10 10   CREATININE  --   < > 0.69 0.51 0.58 0.50 0.57 0.51 0.63  CALCIUM  --   < > 7.3* 7.6* 7.5* 7.4* 7.3* 7.5* 7.4*  MG 2.6*  --  2.4 1.9  --   --  1.3* 1.8  --   PHOS  --   --   --  4.9*  --   --   --   --   --   < > = values in this interval not displayed. Liver Function Tests:  Recent Labs Lab 09/18/14 0520 09/19/14 0500  AST 37 36  ALT 16 14  ALKPHOS 182* 170*  BILITOT 1.0 0.8  PROT 5.7* 5.9*  ALBUMIN 1.4* 1.4*   No results for input(s): LIPASE, AMYLASE in the last 168 hours. No results for input(s): AMMONIA in the last 168 hours. CBC:  Recent Labs Lab 09/19/14 0500 09/20/14 1100 09/21/14 0510 09/22/14 0500 09/23/14 0653  WBC 12.1* 10.1 8.9 7.1 5.9  HGB 11.0* 10.8* 9.9* 9.5* 9.8*  HCT 32.1* 32.2* 30.3* 29.4* 30.5*  MCV 84.7 86.3 87.6 88.8 90.2  PLT 41* 63* 67* 75* 86*   Cardiac Enzymes: No results for input(s): CKTOTAL, CKMB, CKMBINDEX, TROPONINI in the last 168 hours. BNP (last 3 results) No results for input(s): PROBNP in the last 8760 hours. CBG: No results for input(s): GLUCAP in the last 168 hours.  Recent Results (from the past 240 hour(s))  Culture, blood (routine x 2)     Status: None   Collection Time: 09/15/14 12:10 AM  Result Value Ref Range Status   Specimen Description BLOOD R ARM  Final   Special Requests BOTTLES DRAWN AEROBIC ONLY 3CC  Final   Culture   Final    KLEBSIELLA PNEUMONIAE Note: SUSCEPTIBILITIES PERFORMED ON PREVIOUS CULTURE WITHIN THE LAST 5 DAYS. Note: Gram Stain Report Called to,Read Back By and Verified With: STEPHANIE DILLON 09/15/14 @ 5:03PM BY RUSCOE A. Performed at [facility]    Report Status 09/17/2014 FINAL  Final  MRSA PCR Screening     Status: None   Collection Time: 09/15/14 12:27 AM  Result Value Ref Range Status   MRSA by PCR NEGATIVE NEGATIVE Final    Comment:        The GeneXpert MRSA Assay (FDA approved for NASAL specimens only), is one component of  a comprehensive MRSA colonization surveillance program. It is not intended to diagnose MRSA infection nor to guide or monitor treatment for MRSA infections.   Culture, blood (routine x 2)     Status: None   Collection Time: 09/15/14 12:38 AM  Result Value Ref Range Status   Specimen Description BLOOD R IS CVC  Final   Special Requests BOTTLES DRAWN AEROBIC AND ANAEROBIC 5CC EACH  Final   Culture   Final    KLEBSIELLA PNEUMONIAE Note: Gram Stain Report Called to,Read Back By and Verified With: STEPHANIE DILLON 09/15/14 @ 5:03PM BY RUSCOE A Performed at [facility]    Report Status 09/17/2014 FINAL  Final   Organism ID, Bacteria KLEBSIELLA PNEUMONIAE  Final      Susceptibility   Klebsiella pneumoniae - MIC*    AMPICILLIN RESISTANT      AMPICILLIN/SULBACTAM 8 SENSITIVE Sensitive     CEFAZOLIN <=4 SENSITIVE Sensitive  CEFEPIME <=1 SENSITIVE Sensitive     CEFTAZIDIME <=1 SENSITIVE Sensitive     CEFTRIAXONE <=1 SENSITIVE Sensitive     CIPROFLOXACIN <=0.25 SENSITIVE Sensitive     GENTAMICIN <=1 SENSITIVE Sensitive     IMIPENEM <=0.25 SENSITIVE Sensitive     PIP/TAZO <=4 SENSITIVE Sensitive     TOBRAMYCIN <=1 SENSITIVE Sensitive     TRIMETH/SULFA <=20 SENSITIVE Sensitive     * KLEBSIELLA PNEUMONIAE  Culture, blood (routine x 2)     Status: None (Preliminary result)   Collection Time: 09/19/14  2:50 PM  Result Value Ref Range Status   Specimen Description BLOOD RIGHT ARM  Final   Special Requests BOTTLES DRAWN AEROBIC ONLY 6CC  Final   Culture   Final           BLOOD CULTURE RECEIVED NO GROWTH TO DATE CULTURE WILL BE HELD FOR 5 DAYS BEFORE ISSUING A FINAL NEGATIVE REPORT Performed at [facility]    Report Status PENDING  Incomplete  Culture, blood (routine x 2)     Status: None (Preliminary result)   Collection Time: 09/19/14  3:05 PM  Result Value Ref Range Status   Specimen Description BLOOD RIGHT ARM  Final   Special Requests BOTTLES DRAWN AEROBIC ONLY  4CC  Final   Culture   Final           BLOOD CULTURE RECEIVED NO GROWTH TO DATE CULTURE WILL BE HELD FOR 5 DAYS BEFORE ISSUING A FINAL NEGATIVE REPORT Performed at [facility]    Report Status PENDING  Incomplete  Urine culture     Status: None   Collection Time: 09/20/14 10:45 AM  Result Value Ref Range Status   Specimen Description URINE, CATHETERIZED  Final   Special Requests NONE  Final   Colony Count NO GROWTH Performed at [facility]   Final   Culture NO GROWTH Performed at [facility]   Final   Report Status 09/23/2014 FINAL  Final     Studies: Dg Chest Port 1 View  09/22/2014   CLINICAL DATA:  Sepsis and status post Whipple procedure to resect pancreatic head carcinoma.  EXAM: PORTABLE CHEST - 1 VIEW  COMPARISON:  09/21/2014  FINDINGS: Right-sided jugular central line tip is stable and positioned within the SVC. Lungs show marginally improved aeration bilaterally with evidence of persistent predominantly interstitial pattern of infiltrates bilaterally. No overt airspace edema or pleural fluid identified. No pneumothorax. The heart size and mediastinal contours remain normal.  IMPRESSION: Slightly improved aeration bilaterally. Persistent interstitial pattern of infiltrates bilaterally.   Electronically Signed   By: Aletta Edouard M.D.   On: 09/22/2014 10:01    Scheduled Meds: . antiseptic oral rinse  7 mL Mouth Rinse q12n4p  . cefTRIAXone (ROCEPHIN)  IV  2 g Intravenous Q24H  . feeding supplement (ENSURE COMPLETE)  237 mL Oral TID BM  . fentaNYL  25 mcg Transdermal Q72H  . lidocaine  1 patch Transdermal Q24H  . pantoprazole (PROTONIX) IV  40 mg Intravenous Q12H  . pregabalin  25 mg Oral QHS  . sodium chloride  10-40 mL Intracatheter Q12H   Continuous Infusions: . dextrose 5 % and 0.45% NaCl 10 mL/hr at 09/20/14 2011  . heparin 1,250 Units/hr (09/23/14 0825)    Principal Problem:   Septic shock Active Problems:   Hemorrhagic shock    Depression   Protein-calorie malnutrition, severe   Acute GI bleeding   Thrombocytopenia   Portal vein thrombosis  Sepsis   Acute respiratory failure with hypoxia   Hypokalemia   Hypomagnesemia   Sepsis associated hypotension   Bacteremia due to Klebsiella pneumoniae    Time spent: 6 mins    [facility] MD [facility] Pager [phone]. If 7PM-7AM, please contact night-coverage at [email or web address], password [facility] 09/23/2014, 2:42 PM  LOS: 9 days

## 2014-09-23 NOTE — Progress Notes (Signed)
ANTICOAGULATION CONSULT NOTE - Brief Note for heparin level follow-up  Pharmacy Consult for Heparin Indication: portal vein thrombosis  Today, 09/23/2014: - Refer to previous note this morning for details  Repeat Heparin level = 0.49 on heparin 1250 units/hr (therapeutic as goal 0.3 - 0.7)  No complications of therapy noted  Hgb 9.8, pltc 86k and improving  Plan:   Continue heparin gtt at 1250 units/hr as level therapeutic x 2  F/U AM heparin level & CBC  Leone Haven, PharmD  09/23/2014 10:17 PM

## 2014-09-23 NOTE — Progress Notes (Signed)
ANTICOAGULATION CONSULT NOTE - Brief Note for heparin level follow-up  Pharmacy Consult for Heparin Indication: portal vein thrombosis  Today, 09/23/2014: - Refer to previous note this morning for details  Heparin level = 0.51 on heparin 1250 units/hr (1st therapeutic level)  No complications of therapy noted  Hgb 9.8, pltc 86k and improving  Plan:   Continue heparin gtt at 1250 units/hr as level therapeutic x 1  Recheck heparin level at 21:00 tonight to confirm   Doreene Eland, PharmD, BCPS.   Pager: [phone] 09/23/2014 4:42 PM

## 2014-09-24 LAB — COMPREHENSIVE METABOLIC PANEL
ALK PHOS: 156 U/L — AB (ref 39–117)
ALT: 16 U/L (ref 0–35)
AST: 26 U/L (ref 0–37)
Albumin: 1.6 g/dL — ABNORMAL LOW (ref 3.5–5.2)
Anion gap: 8 (ref 5–15)
BILIRUBIN TOTAL: 0.8 mg/dL (ref 0.3–1.2)
BUN: 9 mg/dL (ref 6–23)
CO2: 24 mmol/L (ref 19–32)
Calcium: 7.6 mg/dL — ABNORMAL LOW (ref 8.4–10.5)
Chloride: 101 mEq/L (ref 96–112)
Creatinine, Ser: 0.61 mg/dL (ref 0.50–1.10)
GLUCOSE: 90 mg/dL (ref 70–99)
POTASSIUM: 3.6 mmol/L (ref 3.5–5.1)
Sodium: 133 mmol/L — ABNORMAL LOW (ref 135–145)
TOTAL PROTEIN: 6.9 g/dL (ref 6.0–8.3)

## 2014-09-24 LAB — HEPARIN LEVEL (UNFRACTIONATED): Heparin Unfractionated: 0.64 IU/mL (ref 0.30–0.70)

## 2014-09-24 LAB — CBC
HCT: 31.7 % — ABNORMAL LOW (ref 36.0–46.0)
HEMOGLOBIN: 10.2 g/dL — AB (ref 12.0–15.0)
MCH: 29.1 pg (ref 26.0–34.0)
MCHC: 32.2 g/dL (ref 30.0–36.0)
MCV: 90.6 fL (ref 78.0–100.0)
Platelets: 105 10*3/uL — ABNORMAL LOW (ref 150–400)
RBC: 3.5 MIL/uL — ABNORMAL LOW (ref 3.87–5.11)
RDW: 15.8 % — ABNORMAL HIGH (ref 11.5–15.5)
WBC: 7.2 10*3/uL (ref 4.0–10.5)

## 2014-09-24 MED ORDER — POTASSIUM CHLORIDE CRYS ER 20 MEQ PO TBCR
40.0000 meq | EXTENDED_RELEASE_TABLET | Freq: Once | ORAL | Status: AC
  Administered 2014-09-24: 40 meq via ORAL
  Filled 2014-09-24: qty 2

## 2014-09-24 MED ORDER — PANTOPRAZOLE SODIUM 40 MG PO TBEC
40.0000 mg | DELAYED_RELEASE_TABLET | Freq: Two times a day (BID) | ORAL | Status: DC
  Administered 2014-09-24 – 2014-09-30 (×11): 40 mg via ORAL
  Filled 2014-09-24 (×13): qty 1

## 2014-09-24 NOTE — Care Management Note (Addendum)
    Page 1 of 2   09/27/2014     11:14:44 AM CARE MANAGEMENT NOTE 09/27/2014  Patient:  Melinda Conrad, Melinda Conrad   Account Number:  [id number]  Date Initiated:  09/24/2014  Documentation initiated by:  Sunday Spillers  Subjective/Objective Assessment:   65 yo female admitted melanotic stools, fever, later found to have new portal vein thrombus. PTA lived at home alone.     Action/Plan:   Home when stable, states she has help lined up to assist with meals and transportation.   Anticipated DC Date:  10/01/2014   Anticipated DC Plan:  [place]  CM consult      [facility] Choice  HOME HEALTH   Choice offered to / List presented to:  C-1 Patient        [place] arranged  HH-1 RN  [place]      [place].   Status of service:  In process, will continue to follow Medicare Important Message given?   (If response is "NO", the following Medicare IM given date fields will be blank) Date Medicare IM given:   Medicare IM given by:   Date Additional Medicare IM given:   Additional Medicare IM given by:    Discharge Disposition:    Per UR Regulation:  Reviewed for med. necessity/level of care/duration of stay  If discussed at [place] of Stay Meetings, dates discussed:   09/27/2014    Comments:  09/27/14 Dessa Phi RN BSN NCM [phone] [place] already following.Has medicaid-HHPT not covered.Recommend HHRN(safety eval), & Nurse's aide.  09-24-13 Sunday Spillers RN CM 1600 Spoke with patient at bedside this am. Discussed recommendations for [facility] services. States she lives alone but has several people lined up to assist with meals and transportation as well as housekeeping. Provided a list for [facility] choice, she contacted her daughter to discuss. F/U with patient, she has chosen [facility] for [facility] services. Contacted [facility] to arrange, orders in epic.

## 2014-09-24 NOTE — Progress Notes (Signed)
PHYSICAL THERAPY  While on unit observed pt amb in hallway with a family member pushing her own IV pole.  Pt stated she plans to do so several times a day.    Will continue to monitor.  Rica Koyanagi  PTA [facility]  Acute  Rehab Pager      [phone]

## 2014-09-24 NOTE — Progress Notes (Signed)
NUTRITION FOLLOW-UP  INTERVENTION: -Continue Ensure TID as diet advancement tolerated -RD to continue to monitor  NUTRITION DIAGNOSIS: Inadequate oral intake related to nausea/abd pain/decreased appetite as evidenced by PO intake < 75%, 10 lb wt loss in one month, ongoing.  Goal: Pt to meet >/= 90% of their estimated nutrition needs, progressing.    Monitor:  Diet order, total protein/energy intake, labs, weights, GI profile, education needs  ASSESSMENT: Admitted to PCCM service via ED with melena X several days, isolated fever on evening prior to admission (to 104.0 and trated with ibuprofen), episode of hematemesis on evening of presentation to ED for which EMS was dispatched  12/28 -Pt familiar to RD from previous admit in 07/2014 -Pt s/p Whipple procedure; was on IMPACT supplementation trial that required  3 supplements/day for 5 days pre op and 5 days post op .  -Pt continued to be followed by outpatient oncology RD post d/c. Was last seen on 08/24/14. RD had encouraged pt to consume Ensure TID, and was provided with complimentary case of supplement. RD also educated pt on increasing intake of high protein/kcal foods -Pt lethargic during initial RD assessment today. Pt reported ongoing decreased appetite, and not able to tolerate significant intake of solid foods. Was drinking Ensure at home, but was unable to quantify amount daily -Endorsed ongoing weight loss of 10 lb in past one month (8.3% body weight loss, severe for time frame) -NPO for GI bleed. Possible EGD pending surgery recommendations. Palliative care c/s pending. -Refeeding risk d/t chronic illness, severe wt loss, and prolonged period of sup-optimal intake ( > one  Month) -Phos/Mg/K low on admit. Mg now WNL w/repletion  09/24/14 -Pt now on regular diet. PO intake: 50-100% -Pt's weight is +8 lb since 12/28 -Pt is receiving Ensure supplements, states she is drinking them. -Pt states that she plans to continue drinking  supplements at home. Pt feels better today. -Will continue current interventions  Height: Ht Readings from Last 1 Encounters:  09/20/14 5\' 3"  (1.6 m)    Weight: Wt Readings from Last 1 Encounters:  09/24/14 119 lb 0.8 oz (54 kg)  12/28 Re weighed  Pt: 111 lb  BMI:  Body mass index is 21.09 kg/(m^2).  Estimated Nutritional Needs: Kcal:1600-1800 Protein: 90-100 gram Fluid: >/= 1600 ml daily  Skin: surgical incision on abd (port)  Diet Order: Diet regular  EDUCATION NEEDS: -No education needs identified at this time   Intake/Output Summary (Last 24 hours) at 09/24/14 0941 Last data filed at 09/24/14 0520  Gross per 24 hour  Intake 1249.2 ml  Output    550 ml  Net  699.2 ml    Last BM: 1/3  Labs:   Recent Labs Lab 09/18/14 0520  09/21/14 0510 09/22/14 0500 09/23/14 0653 09/24/14 0815  NA 139  < > 136 136 134* 133*  K 3.9  < > 2.9* 4.1 4.1 3.6  CL 117*  < > 107 105 102 101  CO2 17*  < > 24 26 28 24   BUN 20  < > 12 10 10 9   CREATININE 0.51  < > 0.57 0.51 0.63 0.61  CALCIUM 7.6*  < > 7.3* 7.5* 7.4* 7.6*  MG 1.9  --  1.3* 1.8  --   --   PHOS 4.9*  --   --   --   --   --   GLUCOSE 149*  < > 99 95 79 90  < > = values in this interval not displayed.  CBG (  last 3)  No results for input(s): GLUCAP in the last 72 hours.  Scheduled Meds: . cefTRIAXone (ROCEPHIN)  IV  2 g Intravenous Q24H  . feeding supplement (ENSURE COMPLETE)  237 mL Oral TID BM  . fentaNYL  25 mcg Transdermal Q72H  . lidocaine  1 patch Transdermal Q24H  . pantoprazole (PROTONIX) IV  40 mg Intravenous Q12H  . pregabalin  25 mg Oral QHS  . sodium chloride  10-40 mL Intracatheter Q12H    Continuous Infusions: . dextrose 5 % and 0.45% NaCl 10 mL/hr at 09/20/14 2011  . heparin 1,250 Units/hr (09/24/14 0033)   Clayton Bibles, MS, RD, LDN Pager: [phone] After Hours Pager: [phone]

## 2014-09-24 NOTE — Progress Notes (Signed)
ANTICOAGULATION CONSULT NOTE - Follow-up Consult  Pharmacy Consult for Heparin Indication: portal vein thrombosis  No Known Allergies  Patient Measurements: Height: 5\' 3"  (160 cm) Weight: 119 lb 0.8 oz (54 kg) IBW/kg (Calculated) : 52.4   Vital Signs: Temp: 98 F (36.7 C) (01/04 0510) Temp Source: Oral (01/04 0510) BP: 113/63 mmHg (01/04 0510) Pulse Rate: 54 (01/04 0510)  Labs:  Recent Labs  09/22/14 0500  09/23/14 0653 09/23/14 1527 09/23/14 2100 09/24/14 0815  HGB 9.5*  --  9.8*  --   --  10.2*  HCT 29.4*  --  30.5*  --   --  31.7*  PLT 75*  --  86*  --   --  PENDING  HEPARINUNFRC  --   < > 0.27* 0.51 0.49 0.64  CREATININE 0.51  --  0.63  --   --  0.61  < > = values in this interval not displayed.  Estimated Creatinine Clearance: 61.1 mL/min (by C-G formula based on Cr of 0.61).  Medications:   Infusions:  . dextrose 5 % and 0.45% NaCl 10 mL/hr at 09/20/14 2011  . heparin 1,250 Units/hr (09/24/14 0033)   Assessment: 64 y/o F with recently diagnosed pancreatic adenocarcinoma admitted 12/25 with hemorrhagic shock secondary to GI bleed after recently taking ibuprofen. Patient also with sepsis due to Klebsiella bacteremia on ceftriaxone  On 12/27 CT abdomen showed new main portal vein thrombus; nonocclusive, possibly septic, but occupying a majority of the lumen.  Per MD assessment, patient is at risk of infarction unless anticoagulation started despite the risk of hemorrhage in setting of severe thrombocytopenia.  IV heparin infusion without bolus and with pharmacy dosing assistance was started at that time.  On 12/28 platelets decreased to 21k and anticoagulation was held.   In the interim, hemorraghic shock and sepsis have improved, platelets now up to 75k and on 09/22/14 Pharmacy is consulted re-start heparin gtt withOUT a bolus.  Today, 09/24/2014:  Heparin level = 0.64 at 1250 units/hr (therapeutic)  No complications of therapy noted  Hgb 10.2, pltc 86k  yesterday, improving, today plt count pending  Goal of Therapy:  Heparin level 0.3 - 0.7 units/mL Monitor platelets by anticoagulation protocol: Yes   Plan:  - Continue heparin at current rate - Daily heparin level and CBC while on heparin infusion - Monitor for signs/symptoms of bleeding - Follow up plans for long term anticoagulation (? LMWH with solid-tumor malignancy)  Ralene Bathe, PharmD, BCPS 09/24/2014, 10:15 AM  Phone: [phone]

## 2014-09-24 NOTE — Progress Notes (Signed)
[facility] PROGRESS NOTE  Melinda Conrad IOX:[id number] DOB: 05-17-1951 DOA: 09/14/2014 PCP: Kevan Ny, MD  Brief History 64 y/o female with history of pancreatitis, hypertension, pancreatic cancer with prior Whipple surgery (08/07/14) with preoperative radiation and chemotherapy. The patient presented to the hospital on 09/14/2014 with several days of melanotic stools, fever up to 104.22F, and hematemesis. The patient was found to be lethargic and hypotensive in the emergency department even with fluid resuscitation and PRBC infusion. She was admitted to the ICU under the critical care service, and she was started on Levophed. On admission, hemoglobin was 5.8. 12/25 CT abd/pelvis showed two ovoid high-density lesions adjacent to the pancreatic stent within the jejunum could represent hemorrhage or hematoma. No additional gross evidence of surgical complication. Gastroenterology was consulted.the patient was placed on an IV Protonix drip. It was thought that the patient's GI bleed was due to chronic NSAID use vs ischemia induced from her hypotension. EGD was deferred because of the patient's persistent hypotension. Blood cultures performed at the time of admission revealed Klebsiella pneumoniae. The patient was initially started on Zosyn, but this was narrowed to ceftriaxone. The patient continued to have persistent abdominal pain. Repeat CT of the abdomen and pelvis with contrast on 09/16/2014 was performed. It revealed a new portal vein thrombus, bilateral pleural effusions with bibasilar infiltrates . There was no abscess or leakage at her surgical sites. Gen. surgery was consulted. After careful consideration amongst all consultants due to the patient's recent GI bleed, the patient was started on a heparin drip. The patient continued to have progressive thrombocytopenia. Heme/Onc, Dr. Benay Spice was consulted. The patient's heparin drip was subsequently held with the hopes that with her  thrombocytopenia will improved with treatment for sepsis. Thrombocytopenia improving. Patient has been started on heparin per [facility].  Assessment/Plan: #1 severe sepsis Patient was admitted with sepsis felt to be secondary to her bacteremia. Patient improving clinically. Afebrile. Patient off pressors. Blood cultures with Klebsiella pneumonia likely seeded from the GI tract. Patient currently on IV Rocephin. On D/C will change to oral augmentin and treat for additional 3 weeks. Follow.  #2 Klebsiella pneumonia bacteremia Likely seeded from the GI source. 12/30 surveillance blood cultures in the setting of portal vein thromboses were negative at 24 hours still pending. Continue IV Rocephin. On discharge will change to oral augmentin and treat for 3 more weeks. Discussed with ID.  #3 hemorrhagic shock/acute blood loss anemia Likely secondary to chronic NSAID use. Patient with no further bleeding today. Patient status post 4 units packed red blood cells. Hemoglobin currently stable. Follow.Continue PPI BID. GI was following and recommend monitoring at this point in time.  #4 portal vein thrombosis Anticoagulation held secondary to significant thrombocytopenia. Platelet count improving. Patient has been started empirically on IV heparin per general surgery as platelet counts have improved. Monitor closely for bleeding and follow platelet counts.   #5 acute respiratory failure Concern for TRALI secondary to transfusions versus acute lung injury from bacteremia. Patient satting well. Patient hemodynamically stable. Patients with some shortness of breath however states improving. Patient diuresed 2.2 L yesterday and is currently +6 L since admission. Patient received 40 mg of IV Lasix on 12/29, 12/30, 12/31. Hold lasix today. Follow. Continue empiric IV antibiotics.  #6 thrombocytopenia Likely secondary to sepsis with possible DIC and consumption. Platelet function showing recovery and improving. No  schistocytes on peripheral smear. Patient may have had low-grade DIC in the setting of sepsis and mild quite a lot of the. Follow.  #  7 acute kidney injury Secondary to sepsis and pre-azotemia secondary to hypotension. Improved and currently at baseline. Monitor with diuresis.  #8 severe protein calorie malnutrition Patient has been seen by dietitian. Continue current supplements. Diet has been advanced. Follow.  #9 pancreatic adenocarcinoma Status post Whipple's with preoperative XRT and chemotherapy. Patient has been seen by Dr. Benay Spice of oncology and chemotherapy on hold for now. Outpatient follow-up with oncology.  #10 hypokalemia/hypomagnesemia Likely secondary to diuretics. Repleted. Keep magnesium greater than 2. Keep potassium greater than 4.  #11 deconditioning/debility Patient has been seen by physical therapy recommended home health PT.  #12 prophylaxis Protonix for GI prophylaxis. SCDs for DVT prophylaxis.    Code Status: Full Family Communication: Updated patient and family present. Disposition Plan: Home with home health when medically stable.   Consultants:  Palliative care Dr. Hilma Favors 09/19/2014  Gastroenterology Dr. Deatra Ina 09/15/2014  Hematology/oncology Dr. Benay Spice: 09/17/2014  Procedures: R IJ CVL 12/25 >>  12/25 CT abd/pelvis: Two ovoid high-density lesions adjacent to the pancreatic stent within the jejunum could represent hemorrhage or hematoma. No additional gross evidence of surgical complication on this non IV and oral contrast exam. Potential small additional clot within the stomach is indeterminate 09/16/2014 CT abdomen and pelvis:Changes consistent with prior Whipple procedure with pancreatic stent in place. Some postoperative free fluid is noted although no organized collection to suggest abscess is noted at this time.  New main portal vein thrombus which is nonocclusive but occupies a majority of the lumen of the main portal vein. There is  some differential enhancement of the liver identified which in part is likely related to this thrombus and timing of the contrast bolus.  New bilateral pleural effusions with bibasilar infiltrates particularly in the right middle lobe. This may be the etiology of the patient's elevated white blood cell count. These are new from the prior exam from 09/14/2014.  Changes of anasarca.  12/25 Admitted with UGIB to ICU. 2 units PRBCs ordered Chest x-ray 09/14/2014, 09/17/2014, 09/19/2014, 09/21/2014 KUB 09/16/2014 2-D echo 09/21/2014  Antibiotics: Ceftriaxone 12/25 >> 12/26 Zosyn 12/26 >>12/28 Rocephin 12/28>>>  HPI/Subjective: Patient states she feels better. Patient denies any acute bleeding. Patient states tolerating diet. Patient still states some shortness of breath however improving. Patient states some abdominal pain however improving.   Objective: Filed Vitals:   09/24/14 0510  BP: 113/63  Pulse: 54  Temp: 98 F (36.7 C)  Resp: 16    Intake/Output Summary (Last 24 hours) at 09/24/14 1114 Last data filed at 09/24/14 0520  Gross per 24 hour  Intake   1030 ml  Output    550 ml  Net    480 ml   Filed Weights   09/22/14 0500 09/23/14 0500 09/24/14 0519  Weight: 55.8 kg (123 lb 0.3 oz) 56.4 kg (124 lb 5.4 oz) 54 kg (119 lb 0.8 oz)    Exam:   General:  NAD  Cardiovascular: RRR  Respiratory: CTAB  Abdomen: Soft, some diffuse tenderness to palpation, positive bowel sounds, no rebound, no guarding.  Musculoskeletal: No clubbing cyanosis or edema.  Data Reviewed: Basic Metabolic Panel:  Recent Labs Lab 09/18/14 0520  09/20/14 1100 09/21/14 0510 09/22/14 0500 09/23/14 0653 09/24/14 0815  NA 139  < > 139 136 136 134* 133*  K 3.9  < > 3.0* 2.9* 4.1 4.1 3.6  CL 117*  < > 113* 107 105 102 101  CO2 17*  < > 20 24 26 28 24   GLUCOSE 149*  < > 74  99 95 79 90  BUN 20  < > 16 12 10 10 9   CREATININE 0.51  < > 0.50 0.57 0.51 0.63 0.61  CALCIUM 7.6*  < > 7.4*  7.3* 7.5* 7.4* 7.6*  MG 1.9  --   --  1.3* 1.8  --   --   PHOS 4.9*  --   --   --   --   --   --   < > = values in this interval not displayed. Liver Function Tests:  Recent Labs Lab 09/18/14 0520 09/19/14 0500 09/24/14 0815  AST 37 36 26  ALT 16 14 16   ALKPHOS 182* 170* 156*  BILITOT 1.0 0.8 0.8  PROT 5.7* 5.9* 6.9  ALBUMIN 1.4* 1.4* 1.6*   No results for input(s): LIPASE, AMYLASE in the last 168 hours. No results for input(s): AMMONIA in the last 168 hours. CBC:  Recent Labs Lab 09/20/14 1100 09/21/14 0510 09/22/14 0500 09/23/14 0653 09/24/14 0815  WBC 10.1 8.9 7.1 5.9 7.2  HGB 10.8* 9.9* 9.5* 9.8* 10.2*  HCT 32.2* 30.3* 29.4* 30.5* 31.7*  MCV 86.3 87.6 88.8 90.2 90.6  PLT 63* 67* 75* 86* 105*   Cardiac Enzymes: No results for input(s): CKTOTAL, CKMB, CKMBINDEX, TROPONINI in the last 168 hours. BNP (last 3 results) No results for input(s): PROBNP in the last 8760 hours. CBG: No results for input(s): GLUCAP in the last 168 hours.  Recent Results (from the past 240 hour(s))  Culture, blood (routine x 2)     Status: None   Collection Time: 09/15/14 12:10 AM  Result Value Ref Range Status   Specimen Description BLOOD R ARM  Final   Special Requests BOTTLES DRAWN AEROBIC ONLY 3CC  Final   Culture   Final    KLEBSIELLA PNEUMONIAE Note: SUSCEPTIBILITIES PERFORMED ON PREVIOUS CULTURE WITHIN THE LAST 5 DAYS. Note: Gram Stain Report Called to,Read Back By and Verified With: STEPHANIE DILLON 09/15/14 @ 5:03PM BY RUSCOE A. Performed at [facility]    Report Status 09/17/2014 FINAL  Final  MRSA PCR Screening     Status: None   Collection Time: 09/15/14 12:27 AM  Result Value Ref Range Status   MRSA by PCR NEGATIVE NEGATIVE Final    Comment:        The GeneXpert MRSA Assay (FDA approved for NASAL specimens only), is one component of a comprehensive MRSA colonization surveillance program. It is not intended to diagnose MRSA infection nor to guide  or monitor treatment for MRSA infections.   Culture, blood (routine x 2)     Status: None   Collection Time: 09/15/14 12:38 AM  Result Value Ref Range Status   Specimen Description BLOOD R IS CVC  Final   Special Requests BOTTLES DRAWN AEROBIC AND ANAEROBIC 5CC EACH  Final   Culture   Final    KLEBSIELLA PNEUMONIAE Note: Gram Stain Report Called to,Read Back By and Verified With: STEPHANIE DILLON 09/15/14 @ 5:03PM BY RUSCOE A Performed at [facility]    Report Status 09/17/2014 FINAL  Final   Organism ID, Bacteria KLEBSIELLA PNEUMONIAE  Final      Susceptibility   Klebsiella pneumoniae - MIC*    AMPICILLIN RESISTANT      AMPICILLIN/SULBACTAM 8 SENSITIVE Sensitive     CEFAZOLIN <=4 SENSITIVE Sensitive     CEFEPIME <=1 SENSITIVE Sensitive     CEFTAZIDIME <=1 SENSITIVE Sensitive     CEFTRIAXONE <=1 SENSITIVE Sensitive     CIPROFLOXACIN <=0.25 SENSITIVE Sensitive  GENTAMICIN <=1 SENSITIVE Sensitive     IMIPENEM <=0.25 SENSITIVE Sensitive     PIP/TAZO <=4 SENSITIVE Sensitive     TOBRAMYCIN <=1 SENSITIVE Sensitive     TRIMETH/SULFA <=20 SENSITIVE Sensitive     * KLEBSIELLA PNEUMONIAE  Culture, blood (routine x 2)     Status: None (Preliminary result)   Collection Time: 09/19/14  2:50 PM  Result Value Ref Range Status   Specimen Description BLOOD RIGHT ARM  Final   Special Requests BOTTLES DRAWN AEROBIC ONLY 6CC  Final   Culture   Final           BLOOD CULTURE RECEIVED NO GROWTH TO DATE CULTURE WILL BE HELD FOR 5 DAYS BEFORE ISSUING A FINAL NEGATIVE REPORT Performed at [facility]    Report Status PENDING  Incomplete  Culture, blood (routine x 2)     Status: None (Preliminary result)   Collection Time: 09/19/14  3:05 PM  Result Value Ref Range Status   Specimen Description BLOOD RIGHT ARM  Final   Special Requests BOTTLES DRAWN AEROBIC ONLY 4CC  Final   Culture   Final           BLOOD CULTURE RECEIVED NO GROWTH TO DATE CULTURE WILL BE HELD FOR 5 DAYS  BEFORE ISSUING A FINAL NEGATIVE REPORT Performed at [facility]    Report Status PENDING  Incomplete  Urine culture     Status: None   Collection Time: 09/20/14 10:45 AM  Result Value Ref Range Status   Specimen Description URINE, CATHETERIZED  Final   Special Requests NONE  Final   Colony Count NO GROWTH Performed at [facility]   Final   Culture NO GROWTH Performed at [facility]   Final   Report Status 09/23/2014 FINAL  Final     Studies: No results found.  Scheduled Meds: . cefTRIAXone (ROCEPHIN)  IV  2 g Intravenous Q24H  . feeding supplement (ENSURE COMPLETE)  237 mL Oral TID BM  . fentaNYL  25 mcg Transdermal Q72H  . lidocaine  1 patch Transdermal Q24H  . pantoprazole  40 mg Oral BID  . pregabalin  25 mg Oral QHS  . sodium chloride  10-40 mL Intracatheter Q12H   Continuous Infusions: . dextrose 5 % and 0.45% NaCl 10 mL/hr at 09/20/14 2011  . heparin 1,250 Units/hr (09/24/14 0033)    Principal Problem:   Septic shock Active Problems:   Hemorrhagic shock   Depression   Protein-calorie malnutrition, severe   Acute GI bleeding   Thrombocytopenia   Portal vein thrombosis   Sepsis   Acute respiratory failure with hypoxia   Hypokalemia   Hypomagnesemia   Sepsis associated hypotension   Bacteremia due to Klebsiella pneumoniae    Time spent: 51 mins    [facility] MD [facility] Pager [phone]. If 7PM-7AM, please contact night-coverage at [email or web address], password [facility] 09/24/2014, 11:14 AM  LOS: 10 days

## 2014-09-24 NOTE — Progress Notes (Addendum)
9 Days Post-Op  Subjective: She looks much better than last time I saw her.  She is on a regular diet. Wound healing nicely.  Objective: Vital signs in last 24 hours: Temp:  [98 F (36.7 C)-98.5 F (36.9 C)] 98 F (36.7 C) (01/04 0510) Pulse Rate:  [52-72] 54 (01/04 0510) Resp:  [16] 16 (01/04 0510) BP: (110-113)/(57-64) 113/63 mmHg (01/04 0510) SpO2:  [99 %-100 %] 100 % (01/04 0510) Weight:  [54 kg (119 lb 0.8 oz)] 54 kg (119 lb 0.8 oz) (01/04 0519) Last BM Date: 09/23/14 960 PO Afebrile, VSS +BM Labs OK No films Intake/Output from previous day: 01/03 0701 - 01/04 0700 In: 1489.2 [P.O.:960; I.V.:479.2; IV Piggyback:50] Out: 550 [Urine:550] Intake/Output this shift:    General appearance: alert, cooperative and no distress GI: soft, non-tender; bowel sounds normal; no masses,  no organomegaly and open site looks fine.  Lab Results:   Recent Labs  09/23/14 0653 09/24/14 0815  WBC 5.9 7.2  HGB 9.8* 10.2*  HCT 30.5* 31.7*  PLT 86* PENDING    BMET  Recent Labs  09/23/14 0653 09/24/14 0815  NA 134* 133*  K 4.1 3.6  CL 102 101  CO2 28 24  GLUCOSE 79 90  BUN 10 9  CREATININE 0.63 0.61  CALCIUM 7.4* 7.6*   PT/INR No results for input(s): LABPROT, INR in the last 72 hours.   Recent Labs Lab 09/18/14 0520 09/19/14 0500 09/24/14 0815  AST 37 36 26  ALT 16 14 16   ALKPHOS 182* 170* 156*  BILITOT 1.0 0.8 0.8  PROT 5.7* 5.9* 6.9  ALBUMIN 1.4* 1.4* 1.6*     Lipase     Component Value Date/Time   LIPASE <10* 09/14/2014 1721     Studies/Results: No results found.  Medications: . cefTRIAXone (ROCEPHIN)  IV  2 g Intravenous Q24H  . feeding supplement (ENSURE COMPLETE)  237 mL Oral TID BM  . fentaNYL  25 mcg Transdermal Q72H  . lidocaine  1 patch Transdermal Q24H  . pantoprazole (PROTONIX) IV  40 mg Intravenous Q12H  . pregabalin  25 mg Oral QHS  . sodium chloride  10-40 mL Intracatheter Q12H   . dextrose 5 % and 0.45% NaCl 10 mL/hr at 09/20/14  2011  . heparin 1,250 Units/hr (09/24/14 0033)    Assessment/Plan Sepsis with temperature up to 104/hypotendsion Hematemesis/acute GI bleed Thrombocytopenia Bacteremia on blood cultures Klebsiella Portal vein Thrombosis Chronic Ibuprofen use for ongoing pain Anemia admit hemoglobin 5.8, improved after transfusion. 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 Barry Dienes (hospitalized 11/16-12/1/15   Plan:   Doing well from surgery standpoint.  I will find out about follow up with Dr. Barry Dienes.  Her anticoagulation seems to be the issue keeping her here now.  Medicine and Oncology want Dr. Barry Dienes to decide on this and determine if she can go from a surgical standpoint.  Dr. Barry Dienes will be here tomorrow and I will be sure she gets the information.  LOS: 10 days    Rupert Azzara 09/24/2014

## 2014-09-25 LAB — BASIC METABOLIC PANEL
Anion gap: 1 — ABNORMAL LOW (ref 5–15)
BUN: 6 mg/dL (ref 6–23)
CHLORIDE: 106 meq/L (ref 96–112)
CO2: 24 mmol/L (ref 19–32)
CREATININE: 0.54 mg/dL (ref 0.50–1.10)
Calcium: 7.2 mg/dL — ABNORMAL LOW (ref 8.4–10.5)
GFR calc non Af Amer: >90 mL/min (ref >90)
GLUCOSE: 75 mg/dL (ref 70–99)
Potassium: 3.7 mmol/L (ref 3.5–5.1)
Sodium: 131 mmol/L — ABNORMAL LOW (ref 135–145)

## 2014-09-25 LAB — CULTURE, BLOOD (ROUTINE X 2)
CULTURE: NO GROWTH
Culture: NO GROWTH

## 2014-09-25 LAB — CBC
HCT: 28.5 % — ABNORMAL LOW (ref 36.0–46.0)
Hemoglobin: 9 g/dL — ABNORMAL LOW (ref 12.0–15.0)
MCH: 28.3 pg (ref 26.0–34.0)
MCHC: 31.6 g/dL (ref 30.0–36.0)
MCV: 89.6 fL (ref 78.0–100.0)
PLATELETS: 105 10*3/uL — AB (ref 150–400)
RBC: 3.18 MIL/uL — AB (ref 3.87–5.11)
RDW: 15.7 % — ABNORMAL HIGH (ref 11.5–15.5)
WBC: 5.3 10*3/uL (ref 4.0–10.5)

## 2014-09-25 LAB — FOLATE: FOLATE: 9.6 ng/mL

## 2014-09-25 LAB — IRON AND TIBC
Iron: 28 ug/dL — ABNORMAL LOW (ref 42–145)
SATURATION RATIOS: 20 % (ref 20–55)
TIBC: 141 ug/dL — ABNORMAL LOW (ref 250–470)
UIBC: 113 ug/dL — ABNORMAL LOW (ref 125–400)

## 2014-09-25 LAB — FERRITIN: Ferritin: 1476 ng/mL — ABNORMAL HIGH (ref 10–291)

## 2014-09-25 LAB — HEPARIN LEVEL (UNFRACTIONATED): HEPARIN UNFRACTIONATED: 0.46 [IU]/mL (ref 0.30–0.70)

## 2014-09-25 LAB — VITAMIN B12: Vitamin B-12: >2000 pg/mL — ABNORMAL HIGH (ref 211–911)

## 2014-09-25 MED ORDER — ENOXAPARIN (LOVENOX) PATIENT EDUCATION KIT
PACK | Freq: Once | Status: AC
  Administered 2014-09-25: 15:00:00
  Filled 2014-09-25: qty 1

## 2014-09-25 MED ORDER — ENOXAPARIN SODIUM 80 MG/0.8ML ~~LOC~~ SOLN
1.5000 mg/kg | SUBCUTANEOUS | Status: DC
  Administered 2014-09-25: 75 mg via SUBCUTANEOUS
  Filled 2014-09-25 (×2): qty 0.8

## 2014-09-25 NOTE — Progress Notes (Signed)
Patient ID: Melinda Conrad, female   DOB: 23-Aug-1951, 64 y.o.   MRN: [id number] 10 Days Post-Op  Subjective: Pt states that she felt like she had "knots in her stomach" last night.  Could not really describe it better.  She denies nausea or vomiting.  She feels ready to go home.  She started IV heparin gtt Sunday without bleeding.    Objective: Vital signs in last 24 hours: Temp:  [98.3 F (36.8 C)-99.9 F (37.7 C)] 98.8 F (37.1 C) (01/05 0554) Pulse Rate:  [51-62] 59 (01/05 0554) Resp:  [16] 16 (01/05 0554) BP: (91-94)/(40-58) 94/51 mmHg (01/05 0554) SpO2:  [100 %] 100 % (01/05 0554) Weight:  [111 lb 15.9 oz (50.8 kg)] 111 lb 15.9 oz (50.8 kg) (01/05 0148) Last BM Date: 09/24/14  Intake/Output from previous day: 01/04 0701 - 01/05 0700 In: 1910 [P.O.:1560; I.V.:300; IV Piggyback:50] Out: 750 [Urine:750] Intake/Output this shift:    General appearance: alert, cooperative and no distress GI: soft, non-tender; non distended.  Wound nearly completely healed.  Lab Results:   Recent Labs  09/24/14 0815 09/25/14 0525  WBC 7.2 5.3  HGB 10.2* 9.0*  HCT 31.7* 28.5*  PLT 105* 105*    BMET  Recent Labs  09/24/14 0815 09/25/14 0525  NA 133* 131*  K 3.6 3.7  CL 101 106  CO2 24 24  GLUCOSE 90 75  BUN 9 6  CREATININE 0.61 0.54  CALCIUM 7.6* 7.2*   PT/INR No results for input(s): LABPROT, INR in the last 72 hours.   Recent Labs Lab 09/19/14 0500 09/24/14 0815  AST 36 26  ALT 14 16  ALKPHOS 170* 156*  BILITOT 0.8 0.8  PROT 5.9* 6.9  ALBUMIN 1.4* 1.6*     Lipase     Component Value Date/Time   LIPASE <10* 09/14/2014 1721     Studies/Results: No results found.  Medications: . cefTRIAXone (ROCEPHIN)  IV  2 g Intravenous Q24H  . feeding supplement (ENSURE COMPLETE)  237 mL Oral TID BM  . fentaNYL  25 mcg Transdermal Q72H  . lidocaine  1 patch Transdermal Q24H  . pantoprazole  40 mg Oral BID  . pregabalin  25 mg Oral QHS  . sodium chloride  10-40 mL  Intracatheter Q12H   . heparin 1,250 Units/hr (09/24/14 0033)    Assessment/Plan Sepsis with temperature up to 104/hypotension Hematemesis/acute GI bleed Thrombocytopenia Bacteremia on blood cultures Klebsiella Portal vein Thrombosis Chronic Ibuprofen use for ongoing pain- now stopped Anemia admit hemoglobin 5.8, improved after transfusion. 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 Barry Dienes (hospitalized 11/16-12/1/15   Plan:    Pt is stable.  She can start [place] lovenox or xarelto.  Would not do coumadin since her nutrition is still poor.  OK to d/c with [facility] RN/PT.  Continue dressing changes at home, but probably will not need to do them for much longer.   Agree with Dr. Biagio Borg plan of d/c on augmentin.   Diet as tolerated.   LOS: 11 days    Melinda Conrad 09/25/2014

## 2014-09-25 NOTE — Progress Notes (Signed)
[facility] PROGRESS NOTE  Melinda Conrad BJS:[id number] DOB: [birth year] DOA: 09/14/2014 PCP: Kevan Ny, MD  Brief History 64 y/o female with history of pancreatitis, hypertension, pancreatic cancer with prior Whipple surgery (08/07/14) with preoperative radiation and chemotherapy. The patient presented to the hospital on 09/14/2014 with several days of melanotic stools, fever up to 104.50F, and hematemesis. The patient was found to be lethargic and hypotensive in the emergency department even with fluid resuscitation and PRBC infusion. She was admitted to the ICU under the critical care service, and she was started on Levophed. On admission, hemoglobin was 5.8. 12/25 CT abd/pelvis showed two ovoid high-density lesions adjacent to the pancreatic stent within the jejunum could represent hemorrhage or hematoma. No additional gross evidence of surgical complication. Gastroenterology was consulted.the patient was placed on an IV Protonix drip. It was thought that the patient's GI bleed was due to chronic NSAID use vs ischemia induced from her hypotension. EGD was deferred because of the patient's persistent hypotension. Blood cultures performed at the time of admission revealed Klebsiella pneumoniae. The patient was initially started on Zosyn, but this was narrowed to ceftriaxone. The patient continued to have persistent abdominal pain. Repeat CT of the abdomen and pelvis with contrast on 09/16/2014 was performed. It revealed a new portal vein thrombus, bilateral pleural effusions with bibasilar infiltrates . There was no abscess or leakage at her surgical sites. Gen. surgery was consulted. After careful consideration amongst all consultants due to the patient's recent GI bleed, the patient was started on a heparin drip. The patient continued to have progressive thrombocytopenia. Heme/Onc, Dr. Benay Spice was consulted. The patient's heparin drip was subsequently held with the hopes that with her  thrombocytopenia will improved with treatment for sepsis. Thrombocytopenia improving. Patient has been started on heparin per [facility].  Assessment/Plan: #1 severe sepsis Patient was admitted with sepsis felt to be secondary to her bacteremia. Patient improving clinically. Afebrile. Patient off pressors. Blood cultures with Klebsiella pneumonia likely seeded from the GI tract. Patient currently on IV Rocephin. On D/C will change to oral augmentin and treat for additional 3 weeks. Follow.  #2 Klebsiella pneumonia bacteremia Likely seeded from the GI source. 12/30 surveillance blood cultures in the setting of portal vein thromboses were negative at 24 hours still pending. Continue IV Rocephin. On discharge will change to oral augmentin and treat for 3 more weeks. Discussed with ID.  #3 hemorrhagic shock/acute blood loss anemia Likely secondary to chronic NSAID use. Patient with no further bleeding today. Patient status post 4 units packed red blood cells. Hemoglobin currently stable. Follow.Continue PPI BID. GI was following and recommend monitoring at this point in time.  #4 portal vein thrombosis Anticoagulation held secondary to significant thrombocytopenia. Platelet count improving. Patient has been started empirically on IV heparin per general surgery as platelet counts have improved. Monitor closely for bleeding and follow platelet counts. Patient with no bleeding. Will transition patient to subcutaneous Lovenox which she'll be discharged home on.  #5 acute respiratory failure Concern for TRALI secondary to transfusions versus acute lung injury from bacteremia. Patient satting well. Patient hemodynamically stable. Patients with some shortness of breath however states improving. Patient diuresed 2.2 L yesterday and is currently +6 L since admission. Patient received 40 mg of IV Lasix on 12/29, 12/30, 12/31. Follow. Continue empiric IV antibiotics.  #6 thrombocytopenia Likely secondary to sepsis  with possible DIC and consumption. Platelet function showing recovery and improving. No schistocytes on peripheral smear. Patient may have had low-grade DIC in the  setting of sepsis and mild quite a lot of the. Follow.  #7 acute kidney injury Secondary to sepsis and pre-azotemia secondary to hypotension. Improved and currently at baseline. Monitor with diuresis.  #8 severe protein calorie malnutrition Patient has been seen by dietitian. Continue current supplements. Diet has been advanced. Follow.  #9 pancreatic adenocarcinoma Status post Whipple's with preoperative XRT and chemotherapy. Patient has been seen by Dr. Benay Spice of oncology and chemotherapy on hold for now. Outpatient follow-up with oncology.  #10 hypokalemia/hypomagnesemia Likely secondary to diuretics. Repleted. Keep magnesium greater than 2. Keep potassium greater than 4.  #11 deconditioning/debility Patient has been seen by physical therapy recommended home health PT.  #12 prophylaxis Protonix for GI prophylaxis. SCDs for DVT prophylaxis.    Code Status: Full Family Communication: Updated patient and family present. Disposition Plan: Home hopefully tomorrow as patient is being transitioned to subcutaneous Lovenox.    Consultants:  Palliative care Dr. Hilma Favors 09/19/2014  Gastroenterology Dr. Deatra Ina 09/15/2014  Hematology/oncology Dr. Benay Spice: 09/17/2014  Procedures: R IJ CVL 12/25 >>  12/25 CT abd/pelvis: Two ovoid high-density lesions adjacent to the pancreatic stent within the jejunum could represent hemorrhage or hematoma. No additional gross evidence of surgical complication on this non IV and oral contrast exam. Potential small additional clot within the stomach is indeterminate 09/16/2014 CT abdomen and pelvis:Changes consistent with prior Whipple procedure with pancreatic stent in place. Some postoperative free fluid is noted although no organized collection to suggest abscess is noted at this  time.  New main portal vein thrombus which is nonocclusive but occupies a majority of the lumen of the main portal vein. There is some differential enhancement of the liver identified which in part is likely related to this thrombus and timing of the contrast bolus.  New bilateral pleural effusions with bibasilar infiltrates particularly in the right middle lobe. This may be the etiology of the patient's elevated white blood cell count. These are new from the prior exam from 09/14/2014.  Changes of anasarca.  12/25 Admitted with UGIB to ICU. 2 units PRBCs ordered Chest x-ray 09/14/2014, 09/17/2014, 09/19/2014, 09/21/2014 KUB 09/16/2014 2-D echo 09/21/2014  Antibiotics: Ceftriaxone 12/25 >> 12/26 Zosyn 12/26 >>12/28 Rocephin 12/28>>>  HPI/Subjective: Patient states she feels better. Patient denies any acute bleeding. Patient states tolerating diet. Patient states some abdominal pain however improving.   Objective: Filed Vitals:   09/25/14 1339  BP: 100/55  Pulse: 65  Temp: 98.1 F (36.7 C)  Resp: 16    Intake/Output Summary (Last 24 hours) at 09/25/14 1407 Last data filed at 09/25/14 1340  Gross per 24 hour  Intake   1750 ml  Output    950 ml  Net    800 ml   Filed Weights   09/23/14 0500 09/24/14 0519 09/25/14 0148  Weight: 56.4 kg (124 lb 5.4 oz) 54 kg (119 lb 0.8 oz) 50.8 kg (111 lb 15.9 oz)    Exam:   General:  NAD  Cardiovascular: RRR  Respiratory: CTAB  Abdomen: Soft, some diffuse tenderness to palpation, positive bowel sounds, no rebound, no guarding.  Musculoskeletal: No clubbing cyanosis or edema.  Data Reviewed: Basic Metabolic Panel:  Recent Labs Lab 09/21/14 0510 09/22/14 0500 09/23/14 0653 09/24/14 0815 09/25/14 0525  NA 136 136 134* 133* 131*  K 2.9* 4.1 4.1 3.6 3.7  CL 107 105 102 101 106  CO2 24 26 28 24 24   GLUCOSE 99 95 79 90 75  BUN 12 10 10 9 6   CREATININE 0.57 0.51  0.63 0.61 0.54  CALCIUM 7.3* 7.5* 7.4* 7.6* 7.2*   MG 1.3* 1.8  --   --   --    Liver Function Tests:  Recent Labs Lab 09/19/14 0500 09/24/14 0815  AST 36 26  ALT 14 16  ALKPHOS 170* 156*  BILITOT 0.8 0.8  PROT 5.9* 6.9  ALBUMIN 1.4* 1.6*   No results for input(s): LIPASE, AMYLASE in the last 168 hours. No results for input(s): AMMONIA in the last 168 hours. CBC:  Recent Labs Lab 09/21/14 0510 09/22/14 0500 09/23/14 0653 09/24/14 0815 09/25/14 0525  WBC 8.9 7.1 5.9 7.2 5.3  HGB 9.9* 9.5* 9.8* 10.2* 9.0*  HCT 30.3* 29.4* 30.5* 31.7* 28.5*  MCV 87.6 88.8 90.2 90.6 89.6  PLT 67* 75* 86* 105* 105*   Cardiac Enzymes: No results for input(s): CKTOTAL, CKMB, CKMBINDEX, TROPONINI in the last 168 hours. BNP (last 3 results) No results for input(s): PROBNP in the last 8760 hours. CBG: No results for input(s): GLUCAP in the last 168 hours.  Recent Results (from the past 240 hour(s))  Culture, blood (routine x 2)     Status: None   Collection Time: 09/19/14  2:50 PM  Result Value Ref Range Status   Specimen Description BLOOD RIGHT ARM  Final   Special Requests BOTTLES DRAWN AEROBIC ONLY [place]  Final   Culture   Final    NO GROWTH 5 DAYS Performed at [facility]    Report Status 09/25/2014 FINAL  Final  Culture, blood (routine x 2)     Status: None   Collection Time: 09/19/14  3:05 PM  Result Value Ref Range Status   Specimen Description BLOOD RIGHT ARM  Final   Special Requests BOTTLES DRAWN AEROBIC ONLY 4[facility]  Final   Culture   Final    NO GROWTH 5 DAYS Performed at [facility]    Report Status 09/25/2014 FINAL  Final  Urine culture     Status: None   Collection Time: 09/20/14 10:45 AM  Result Value Ref Range Status   Specimen Description URINE, CATHETERIZED  Final   Special Requests NONE  Final   Colony Count NO GROWTH Performed at [facility]   Final   Culture NO GROWTH Performed at [facility]   Final   Report Status 09/23/2014 FINAL  Final     Studies: No results  found.  Scheduled Meds: . cefTRIAXone (ROCEPHIN)  IV  2 g Intravenous Q24H  . enoxaparin   Does not apply Once  . feeding supplement (ENSURE COMPLETE)  237 mL Oral TID BM  . fentaNYL  25 mcg Transdermal Q72H  . lidocaine  1 patch Transdermal Q24H  . pantoprazole  40 mg Oral BID  . pregabalin  25 mg Oral QHS  . sodium chloride  10-40 mL Intracatheter Q12H   Continuous Infusions: . heparin 1,250 Units/hr (09/24/14 0033)    Principal Problem:   Septic shock Active Problems:   Hemorrhagic shock   Depression   Protein-calorie malnutrition, severe   Acute GI bleeding   Thrombocytopenia   Portal vein thrombosis   Sepsis   Acute respiratory failure with hypoxia   Hypokalemia   Hypomagnesemia   Sepsis associated hypotension   Bacteremia due to Klebsiella pneumoniae    Time spent: 36 mins    [facility] MD [facility] Pager [phone]. If 7PM-7AM, please contact night-coverage at [email or web address], password [facility] 09/25/2014, 2:07 PM  LOS: 11 days

## 2014-09-25 NOTE — Progress Notes (Signed)
ANTICOAGULATION CONSULT NOTE - Follow-up Consult  Pharmacy Consult for Heparin--->Lovenox Indication: portal vein thrombosis  No Known Allergies  Patient Measurements: Height: _0  (160 cm) Weight: 111 lb 15.9 oz (50.8 kg) IBW/kg (Calculated) : 52.4   Vital Signs: Temp: 98.1 F (36.7 C) (01/05 1339) Temp Source: Oral (01/05 1339) BP: 100/55 mmHg (01/05 1339) Pulse Rate: 65 (01/05 1339)  Labs:  Recent Labs  09/23/14 0653  09/23/14 2100 09/24/14 0815 09/25/14 0525  HGB 9.8*  --   --  10.2* 9.0*  HCT 30.5*  --   --  31.7* 28.5*  PLT 86*  --   --  105* 105*  HEPARINUNFRC 0.27*  < > 0.49 0.64 0.46  CREATININE 0.63  --   --  0.61 0.54  < > = values in this interval not displayed.  Estimated Creatinine Clearance: 59.2 mL/min (by C-G formula based on Cr of 0.54).  Medications:   Infusions:  . heparin 1,250 Units/hr (09/24/14 0033)   Assessment: 64 y/o F with recently diagnosed pancreatic adenocarcinoma admitted 12/25 with hemorrhagic shock secondary to GI bleed after recently taking ibuprofen. Patient also with sepsis due to Klebsiella bacteremia on ceftriaxone  On 12/27 CT abdomen showed new main portal vein thrombus; nonocclusive, possibly septic, but occupying a majority of the lumen.  Per MD assessment, patient is at risk of infarction unless anticoagulation started despite the risk of hemorrhage in setting of severe thrombocytopenia.  IV heparin infusion without bolus and with pharmacy dosing assistance was started at that time.  On 12/28 platelets decreased to 21k and anticoagulation was held.   In the interim, hemorraghic shock and sepsis have improved, platelets now up to 75k and on 09/22/14 Pharmacy is consulted re-start heparin gtt withOUT a bolus.  Today, 09/25/2014:  Heparin level = 0.46 at 1250 units/hr (therapeutic)  No complications of therapy noted  Hgb 9.0, pltc improved to 105K, no signs/symptoms of bleeding per RN   Goal of Therapy:  Heparin level  0.3 - 0.7 units/mL Monitor platelets by anticoagulation protocol: Yes   Plan:  - Continue heparin at current rate - Daily heparin level and CBC while on heparin infusion - Monitor for signs/symptoms of bleeding - Follow up plans for long term anticoagulation (? LMWH with solid-tumor malignancy)  Ralene Bathe, PharmD, BCPS 09/25/2014, 2:11 PM  Phone: [phone]  ADDENDUM:  Surgery team has assessed patient and are ok with starting to long-term anticoagulation - either xarelto or lovenox.  Pt prefers lovenox and pharmacy has been consulted to transition patient from IV heparin to once daily lovenox.    Pt wt = 50.8kg CBC: Plts improved, no bleeding issues noted  Goal of therapy:   4 hour heparin level = 1-2units/ml  Plan:   Stop IV heparin.   After 1 hour, start lovenox 1.85m/kg/day. Lovenox teaching kit    F/u CBC tomorrow morning.    CRalene Bathe PharmD, BCPS 09/25/2014, 2:14 PM  Phone: [phone]

## 2014-09-25 NOTE — Progress Notes (Signed)
Physical Therapy Treatment Patient Details Name: Melinda Conrad MRN: [id number] DOB: 08/17/1951 Today's Date: 09/25/2014    History of Present Illness 64 yo female adm 09/14/14 with GIB, hemorrhagic shock, Hgb 5.8 on admission; PMHx:  s/p whipple on 08/07/14, depression, hx substance abuse. Portal vein thrombosis, respiratory failure, pancreatic cancer.     PT Comments    **Pt progressing quite well with mobility. She walked 400' with RW independently, and performed transfers independently. She stated she's been walking in the hall 2x/day. From PT standpoint she is safe to DC home. PT goals met, no further PT indicated. Encouraged pt to continue walking in halls 2-3x/day and to perform BLE strengthening exercises.  *  Follow Up Recommendations  Home health PT     Equipment Recommendations  Rolling walker with 5" wheels (? TBD)    Recommendations for Other Services       Precautions / Restrictions Precautions Precaution Comments: abdominal surgery Restrictions Weight Bearing Restrictions: No    Mobility  Bed Mobility           Sit to supine: Modified independent (Device/Increase time)   General bed mobility comments: used rail  Transfers Overall transfer level: Modified independent Equipment used: Rolling walker (2 wheeled) Transfers: Sit to/from Stand Sit to Stand: Modified independent (Device/Increase time)            Ambulation/Gait Ambulation/Gait assistance: Modified independent (Device/Increase time) Ambulation Distance (Feet): 400 Feet Assistive device: Rolling walker (2 wheeled) Gait Pattern/deviations: WFL(Within Functional Limits)   Gait velocity interpretation: at or above normal speed for age/gender General Gait Details: good sequencing, no LOB   Stairs            Wheelchair Mobility    Modified Rankin (Stroke Patients Only)       Balance                                    Cognition Arousal/Alertness:  Awake/alert Behavior During Therapy: WFL for tasks assessed/performed Overall Cognitive Status: Within Functional Limits for tasks assessed                      Exercises      General Comments        Pertinent Vitals/Pain      Home Living                      Prior Function            PT Goals (current goals can now be found in the care plan section) Acute Rehab PT Goals Patient Stated Goal: pt wants to be able to move, wants pain to be better; be able to go home with dtr PT Goal Formulation: With patient Time For Goal Achievement: 10/03/14 Potential to Achieve Goals: Good Progress towards PT goals: Goals met, DC PT    Frequency  Min 3X/week    PT Plan Current plan remains appropriate    Co-evaluation             End of Session   Activity Tolerance: Patient tolerated treatment well Patient left: with call bell/phone within reach;in bed;with family/visitor present     Time: 0910-0925 PT Time Calculation (min) (ACUTE ONLY): 15 min  Charges:  $Gait Training: 8-22 mins                    G Codes:        Blondell Reveal Kistler 09/25/2014, 9:30 AM [phone]

## 2014-09-25 NOTE — Progress Notes (Signed)
ANTICOAGULATION CONSULT NOTE - Follow-up Consult  Pharmacy Consult for Heparin Indication: portal vein thrombosis  No Known Allergies  Patient Measurements: Height: 5\' 3"  (160 cm) Weight: 111 lb 15.9 oz (50.8 kg) IBW/kg (Calculated) : 52.4   Vital Signs: Temp: 98.8 F (37.1 C) (01/05 0554) Temp Source: Oral (01/05 0554) BP: 94/51 mmHg (01/05 0554) Pulse Rate: 59 (01/05 0554)  Labs:  Recent Labs  09/23/14 0653  09/23/14 2100 09/24/14 0815 09/25/14 0525  HGB 9.8*  --   --  10.2* 9.0*  HCT 30.5*  --   --  31.7* 28.5*  PLT 86*  --   --  105* 105*  HEPARINUNFRC 0.27*  < > 0.49 0.64 0.46  CREATININE 0.63  --   --  0.61 0.54  < > = values in this interval not displayed.  Estimated Creatinine Clearance: 59.2 mL/min (by C-G formula based on Cr of 0.54).  Medications:   Infusions:  . heparin 1,250 Units/hr (09/24/14 0033)   Assessment: 64 y/o F with recently diagnosed pancreatic adenocarcinoma admitted 12/25 with hemorrhagic shock secondary to GI bleed after recently taking ibuprofen. Patient also with sepsis due to Klebsiella bacteremia on ceftriaxone  On 12/27 CT abdomen showed new main portal vein thrombus; nonocclusive, possibly septic, but occupying a majority of the lumen.  Per MD assessment, patient is at risk of infarction unless anticoagulation started despite the risk of hemorrhage in setting of severe thrombocytopenia.  IV heparin infusion without bolus and with pharmacy dosing assistance was started at that time.  On 12/28 platelets decreased to 21k and anticoagulation was held.   In the interim, hemorraghic shock and sepsis have improved, platelets now up to 75k and on 09/22/14 Pharmacy is consulted re-start heparin gtt withOUT a bolus.  Today, 09/25/2014:  Heparin level = 0.46 at 1250 units/hr (therapeutic)  No complications of therapy noted  Hgb 9.0, pltc improved to 105K, no signs/symptoms of bleeding per RN   Goal of Therapy:  Heparin level 0.3 - 0.7  units/mL Monitor platelets by anticoagulation protocol: Yes   Plan:  - Continue heparin at current rate - Daily heparin level and CBC while on heparin infusion - Monitor for signs/symptoms of bleeding - Follow up plans for long term anticoagulation (? LMWH with solid-tumor malignancy)  Ralene Bathe, PharmD, BCPS 09/25/2014, 7:02 AM  Phone: [phone]

## 2014-09-26 ENCOUNTER — Encounter (HOSPITAL_COMMUNITY): Payer: Self-pay | Admitting: Anesthesiology

## 2014-09-26 ENCOUNTER — Encounter (HOSPITAL_COMMUNITY): Payer: Self-pay | Admitting: Radiology

## 2014-09-26 ENCOUNTER — Inpatient Hospital Stay (HOSPITAL_COMMUNITY): Payer: Medicaid Other

## 2014-09-26 DIAGNOSIS — D62 Acute posthemorrhagic anemia: Secondary | ICD-10-CM

## 2014-09-26 HISTORY — DX: Acute posthemorrhagic anemia: D62

## 2014-09-26 LAB — CBC WITH DIFFERENTIAL/PLATELET
BASOS PCT: 0 % (ref 0–1)
Basophils Absolute: 0 10*3/uL (ref 0.0–0.1)
Basophils Absolute: 0 10*3/uL (ref 0.0–0.1)
Basophils Relative: 1 % (ref 0–1)
EOS ABS: 0.1 10*3/uL (ref 0.0–0.7)
Eosinophils Absolute: 0.1 10*3/uL (ref 0.0–0.7)
Eosinophils Relative: 1 % (ref 0–5)
Eosinophils Relative: 2 % (ref 0–5)
HCT: 25 % — ABNORMAL LOW (ref 36.0–46.0)
HCT: 23.6 % — ABNORMAL LOW (ref 36.0–46.0)
Hemoglobin: 8 g/dL — ABNORMAL LOW (ref 12.0–15.0)
Hemoglobin: 7.6 g/dL — ABNORMAL LOW (ref 12.0–15.0)
LYMPHS ABS: 1 10*3/uL (ref 0.7–4.0)
LYMPHS PCT: 15 % (ref 12–46)
Lymphocytes Relative: 17 % (ref 12–46)
Lymphs Abs: 1 10*3/uL (ref 0.7–4.0)
MCH: 29 pg (ref 26.0–34.0)
MCH: 29.3 pg (ref 26.0–34.0)
MCHC: 32 g/dL (ref 30.0–36.0)
MCHC: 32.2 g/dL (ref 30.0–36.0)
MCV: 90.6 fL (ref 78.0–100.0)
MCV: 91.1 fL (ref 78.0–100.0)
MONOS PCT: 15 % — AB (ref 3–12)
Monocytes Absolute: 1 10*3/uL (ref 0.1–1.0)
Monocytes Absolute: 0.8 10*3/uL (ref 0.1–1.0)
Monocytes Relative: 15 % — ABNORMAL HIGH (ref 3–12)
NEUTROS ABS: 4.5 10*3/uL (ref 1.7–7.7)
NEUTROS ABS: 3.5 10*3/uL (ref 1.7–7.7)
Neutrophils Relative %: 69 % (ref 43–77)
Neutrophils Relative %: 65 % (ref 43–77)
PLATELETS: 127 10*3/uL — AB (ref 150–400)
Platelets: 129 10*3/uL — ABNORMAL LOW (ref 150–400)
RBC: 2.76 MIL/uL — ABNORMAL LOW (ref 3.87–5.11)
RBC: 2.59 MIL/uL — ABNORMAL LOW (ref 3.87–5.11)
RDW: 15.6 % — AB (ref 11.5–15.5)
RDW: 16 % — ABNORMAL HIGH (ref 11.5–15.5)
WBC: 6.5 10*3/uL (ref 4.0–10.5)
WBC: 5.5 10*3/uL (ref 4.0–10.5)

## 2014-09-26 LAB — BASIC METABOLIC PANEL
ANION GAP: 4 — AB (ref 5–15)
BUN: 7 mg/dL (ref 6–23)
CO2: 24 mmol/L (ref 19–32)
Calcium: 7.1 mg/dL — ABNORMAL LOW (ref 8.4–10.5)
Chloride: 105 mEq/L (ref 96–112)
Creatinine, Ser: 0.65 mg/dL (ref 0.50–1.10)
GFR calc Af Amer: >90 mL/min (ref >90)
Glucose, Bld: 85 mg/dL (ref 70–99)
Potassium: 3.4 mmol/L — ABNORMAL LOW (ref 3.5–5.1)
Sodium: 133 mmol/L — ABNORMAL LOW (ref 135–145)

## 2014-09-26 LAB — CBC
HCT: 20.6 % — ABNORMAL LOW (ref 36.0–46.0)
Hemoglobin: 6.7 g/dL — CL (ref 12.0–15.0)
MCH: 29.8 pg (ref 26.0–34.0)
MCHC: 32.5 g/dL (ref 30.0–36.0)
MCV: 91.6 fL (ref 78.0–100.0)
Platelets: 133 10*3/uL — ABNORMAL LOW (ref 150–400)
RBC: 2.25 MIL/uL — ABNORMAL LOW (ref 3.87–5.11)
RDW: 16.1 % — ABNORMAL HIGH (ref 11.5–15.5)
WBC: 7.3 10*3/uL (ref 4.0–10.5)

## 2014-09-26 LAB — PREPARE RBC (CROSSMATCH)

## 2014-09-26 LAB — HEPARIN LEVEL (UNFRACTIONATED): Heparin Unfractionated: 0.14 IU/mL — ABNORMAL LOW (ref 0.30–0.70)

## 2014-09-26 MED ORDER — SODIUM CHLORIDE 0.9 % IV SOLN
INTRAVENOUS | Status: DC
  Administered 2014-09-26: 23:00:00 via INTRAVENOUS

## 2014-09-26 MED ORDER — IBUPROFEN 800 MG PO TABS: 800.0000 mg | ORAL_TABLET | Freq: Two times a day (BID) | ORAL | Status: DC | PRN

## 2014-09-26 MED ORDER — IOHEXOL 300 MG/ML  SOLN
25.0000 mL | INTRAMUSCULAR | Status: AC
  Administered 2014-09-26 (×2): 25 mL via ORAL

## 2014-09-26 MED ORDER — SODIUM CHLORIDE 0.9 % IV SOLN
Freq: Once | INTRAVENOUS | Status: AC
Start: 2014-09-26 — End: 2014-09-27
  Administered 2014-09-26: 1000 mL via INTRAVENOUS

## 2014-09-26 MED ORDER — SODIUM CHLORIDE 0.9 % IV BOLUS (SEPSIS)
500.0000 mL | Freq: Once | INTRAVENOUS | Status: AC
  Administered 2014-09-26: 500 mL via INTRAVENOUS

## 2014-09-26 MED ORDER — PIPERACILLIN-TAZOBACTAM 3.375 G IVPB
3.3750 g | Freq: Three times a day (TID) | INTRAVENOUS | Status: DC
  Administered 2014-09-26 – 2014-09-28 (×7): 3.375 g via INTRAVENOUS
  Filled 2014-09-26 (×7): qty 50

## 2014-09-26 MED ORDER — PSYLLIUM 95 % PO PACK
1.0000 | PACK | Freq: Every day | ORAL | Status: DC
  Filled 2014-09-26 (×5): qty 1

## 2014-09-26 MED ORDER — BISACODYL 10 MG RE SUPP: 10.0000 mg | RECTAL | Status: DC | PRN

## 2014-09-26 MED ORDER — SODIUM CHLORIDE 0.9 % IV SOLN
Freq: Once | INTRAVENOUS | Status: AC
  Administered 2014-09-26: 10 mL/h via INTRAVENOUS

## 2014-09-26 MED ORDER — PANTOPRAZOLE SODIUM 40 MG PO TBEC: 40.0000 mg | DELAYED_RELEASE_TABLET | Freq: Two times a day (BID) | ORAL | Status: DC

## 2014-09-26 MED ORDER — AMLODIPINE BESYLATE 5 MG PO TABS: 5.0000 mg | ORAL_TABLET | Freq: Every day | ORAL | Status: DC

## 2014-09-26 MED ORDER — SODIUM CHLORIDE 0.9 % IV SOLN
INTRAVENOUS | Status: DC
  Administered 2014-09-26: 1000 mL via INTRAVENOUS

## 2014-09-26 MED ORDER — IOHEXOL 300 MG/ML  SOLN
80.0000 mL | Freq: Once | INTRAMUSCULAR | Status: AC | PRN
  Administered 2014-09-26: 80 mL via INTRAVENOUS

## 2014-09-26 MED ORDER — OXYCODONE-ACETAMINOPHEN 5-325 MG PO TABS
1.0000 | ORAL_TABLET | ORAL | Status: DC | PRN
Start: 2014-09-26 — End: 2014-10-09
  Administered 2014-09-26: 1 via ORAL
  Administered 2014-09-26: 2 via ORAL
  Administered 2014-09-26 – 2014-09-27 (×2): 1 via ORAL
  Administered 2014-09-28: 2 via ORAL
  Administered 2014-09-28: 1 via ORAL
  Administered 2014-09-29 – 2014-10-02 (×4): 2 via ORAL
  Administered 2014-10-03: 1 via ORAL
  Administered 2014-10-04 – 2014-10-09 (×3): 2 via ORAL
  Filled 2014-09-26: qty 2
  Filled 2014-09-26 (×2): qty 1
  Filled 2014-09-26: qty 2
  Filled 2014-09-26: qty 1
  Filled 2014-09-26 (×5): qty 2
  Filled 2014-09-26: qty 1
  Filled 2014-09-26 (×3): qty 2
  Filled 2014-09-26: qty 1
  Filled 2014-09-26: qty 2

## 2014-09-26 MED ORDER — PSYLLIUM 0.52 G PO CAPS: 0.5200 g | ORAL_CAPSULE | Freq: Every day | ORAL | Status: DC

## 2014-09-26 MED ORDER — IOHEXOL 300 MG/ML  SOLN: 100.0000 mL | Freq: Once | INTRAMUSCULAR | Status: DC | PRN

## 2014-09-26 MED ORDER — SERTRALINE HCL 100 MG PO TABS
100.0000 mg | ORAL_TABLET | Freq: Every morning | ORAL | Status: DC
  Administered 2014-09-27 – 2014-10-09 (×13): 100 mg via ORAL
  Filled 2014-09-26 (×13): qty 1

## 2014-09-26 NOTE — Progress Notes (Addendum)
     [facility] Progress Note  Subjective:  Called back to see patient because of drop in Hgb and large maroon/burgundy stool with concern for re-bleeding.  Never received EGD for evaluation because bleeding had stopped and had issues with hypotension/sepsis.  Objective:  Vital signs in last 24 hours: Temp:  [97.6 F (36.4 C)-100 F (37.8 C)] 98.4 F (36.9 C) (01/06 1215) Pulse Rate:  [52-71] 52 (01/06 1256) Resp:  [16-18] 18 (01/06 1215) BP: (78-115)/(37-61) 98/50 mmHg (01/06 1300) SpO2:  [97 %-100 %] 100 % (01/06 1215) Weight:  [111 lb 12.4 oz (50.7 kg)] 111 lb 12.4 oz (50.7 kg) (01/06 0127) Last BM Date: 09/25/14 General:  Alert, Well-developed, in NAD Heart:  Bradycardic Pulm:  CTAB. Abdomen:  Soft, non-distended.  BS present.  Dressing noted.  Mild diffuse TTP.   Extremities:  Without edema. Neurologic:  Alert and  oriented x4;  grossly normal neurologically. Psych:  Alert and cooperative. Normal mood and affect.  Intake/Output from previous day: 01/05 0701 - 01/06 0700 In: 8032 [P.O.:1320; I.V.:280; IV Piggyback:50] Out: 750 [Urine:750] Intake/Output this shift: Total I/O In: 584 [P.O.:240; Blood:344] Out: 400 [Urine:400]  Lab Results:  Recent Labs  09/24/14 0815 09/25/14 0525 09/26/14 0434  WBC 7.2 5.3 7.3  HGB 10.2* 9.0* 6.7*  HCT 31.7* 28.5* 20.6*  PLT 105* 105* 133*   BMET  Recent Labs  09/24/14 0815 09/25/14 0525 09/26/14 0434  NA 133* 131* 133*  K 3.6 3.7 3.4*  CL 101 106 105  CO2 24 24 24   GLUCOSE 90 75 85  BUN 9 6 7   CREATININE 0.61 0.54 0.65  CALCIUM 7.6* 7.2* 7.1*   LFT  Recent Labs  09/24/14 0815  PROT 6.9  ALBUMIN 1.6*  AST 26  ALT 16  ALKPHOS 156*  BILITOT 0.8   Assessment / Plan: 81. 64 year old female with history of pancreatic adenocarcinoma, s/p chemoradiation followed by whipple approximately one month ago. Currently admitted with sepsis (Klebsiella bacteremia) / melena / hematemesis in setting of NSAID  use. EGD had been on hold secondary to hypotension and the fact that bleeding had stopped, but now with concern for recurrent bleeding.  On PO PPI BID currently.    2. PVT:  Was on Lovenox most recently, but last dose was 1600 on 1/5; it is now on hold.  3. Anemia of acute blood loss:  Hgb down to 6.7 grams from 9.0 grams yesterday.  Improved to 8.0 grams after one unit PRBC's.  CT scan has been ordered by hospitalist to rule out RP bleed, etc.  *Hold anticoagulants *Will plan for EGD tomorrow, 1/7.   *Monitor Hgb and transfuse further prn. *Continue BID PPI. *Can have clear liquids this evening then NPO after midnight.   LOS: 12 days   ZEHR, JESSICA D.  09/26/2014, 2:46 PM  Pager number [phone]     Attending physician's note   I have taken an interval history, reviewed the chart and examined the patient. I agree with the Advanced Practitioner's note, impression and recommendations. Complicated patient S/P Whipple in 07/2014 with recurrent GI bleeding and ABL anemia, suspected UGI source. Has PVT currently managed on Lovenox. Anticoagulants now on hold. PPI bid for now. Avoid ASA/NSAIDs. EGD tomorrow.  Pricilla Riffle. Fuller Plan, MD [facility]

## 2014-09-26 NOTE — Progress Notes (Signed)
[facility] PROGRESS NOTE  KIKUYE KORENEK JQB:[id number] DOB: August 01, 1951 DOA: 09/14/2014 PCP: Kevan Ny, MD  Brief History  64 y/o female with history of pancreatitis, hypertension, pancreatic cancer with prior Whipple surgery (08/07/14) with preoperative radiation and chemotherapy. The patient presented to the hospital on 09/14/2014 with several days of melanotic stools, fever up to 104.82F, and hematemesis. The patient was found to be lethargic and hypotensive in the emergency department even with fluid resuscitation and PRBC infusion. She was admitted to the ICU under the critical care service, and she was started on Levophed On admission, hemoglobin was 5.8.   12/25 CT abd/pelvis showed two ovoid high-density lesions adjacent to the pancreatic stent within the jejunum could represent hemorrhage or hematoma. No additional gross evidence of surgical complication. Gastroenterology was consulted.the patient was placed on an IV Protonix drip. It was thought that the patient's GI bleed was due to chronic NSAID use vs ischemia induced from her hypotension. EGD was deferred because of the patient's persistent hypotension. Blood cultures performed at the time of admission revealed Klebsiella pneumoniae. The patient was initially started on Zosyn, but this was narrowed to ceftriaxone.   The patient continued to have persistent abdominal pain. Repeat CT of the abdomen and pelvis with contrast on 09/16/2014 was performed. It revealed a new portal vein thrombus, bilateral pleural effusions with bibasilar infiltrates . There was no abscess or leakage at her surgical sites. Gen. surgery was consulted. After careful consideration amongst all consultants due to the patient's recent GI bleed, the patient was started on a heparin drip.   The patient continued to have progressive thrombocytopenia. Heme/Onc, Dr. Benay Spice was consulted. The patient's heparin drip was subsequently held with the hopes that  with her thrombocytopenia will improved with treatment for sepsis. Thrombocytopenia improving.   Unfortunately hemoglobin dropped 9-->6.7, patient complained of left flank pain not relieved by IV pain medications Persistently hypotensive over the course of 1/5-1/6 therefore transferred back to step down unit.  Heparin completely discontinued because of new onset anemia  #1 severe sepsis Patient was admitted with sepsis felt to be secondary to her bacteremia. Patient improving clinically. Afebrile. Patient off pressors. Blood cultures with Klebsiella pneumonia likely seeded from the GI tract. Patient currently on IV Rocephin. On D/C will change to oral augmentin and treat for additional 3 weeks. Follow.  #2 Klebsiella pneumonia bacteremia Likely seeded from the GI source? 12/30 surveillance blood cultures in the setting of portal vein thromboses negative X5 days. CT scan abdomen pelvis 12/27 = bibasilar infiltrates right middle lobe-new from chest x-ray 12/25 Transition antibiotics from Rocephin->Zosyn 09/26/14   #3 hemorrhagic shock/acute blood loss anemia + s/p Whipple T3N1  Likely secondary to chronic NSAID use. Patient with no further bleeding today. Patient status post 4 units packed red blood cells. Hemoglobin currently stable. Follow.Continue PPI BID. GI was following and recommend monitoring at this point in time. Unfortunately blood count dropped to 6.7 so we are getting a CT abdomen pelvis with contrast Transfused 1 unit packed red blood cells 1/6 Await repeat hematocrit  Blood pressure stable currently but hypotensive earlier today   #4 portal vein thrombosis Anticoagulation held secondary to significant thrombocytopenia. Platelet count improving. Patient has been started empirically on IV heparin per general surgery as platelet counts have improved.  Given events #3, will need to discuss with both general surgery/oncology further plan going forward For now heparin/Lovenox have  been completely held  #5 acute respiratory failure Concern for TRALI secondary to transfusions versus acute  lung injury from bacteremia.  Patient satting well. Patient hemodynamically stable.  Patients with some shortness of breath however states improving. Patient diuresed cumulatively -2.1 L since admission. Patient received 40 mg of IV Lasix on 12/29, 12/30, 12/31.  Hold IV Lasix for right now given low blood pressure  #6 thrombocytopenia Likely secondary to sepsis with possible DIC and consumption. Platelet function showing recovery and improving. No schistocytes on peripheral smear. Patient may have had low-grade DIC in the setting of sepsis ?  #7 acute kidney injury Secondary to sepsis and pre-azotemia secondary to hypotension. Improved and currently at baseline. Monitor with diuresis. Resolved  #8 severe protein calorie malnutrition Patient has been seen by dietitian. Continue current supplements. Diet has been advanced. Follow.  #9 pancreatic adenocarcinoma Status post Whipple's with preoperative XRT and chemotherapy. Patient has been seen by Dr. Benay Spice of oncology and chemotherapy on hold for now. Outpatient follow-up with oncology.  #10 hypokalemia/hypomagnesemia Likely secondary to diuretics. Repleted. Keep magnesium greater than 2. Keep potassium greater than 4.  #11 deconditioning/debility Patient has been seen by physical therapy recommended home health PT.  #12 prophylaxis Protonix for GI prophylaxis. SCDs for DVT prophylaxis.    Code Status: Full Family Communication: Updated patient and family present. Disposition Plan: Transfer back to stepdown unit overnight pending resolution of the events If no improvement would reconsult palliative care   Consultants:  Palliative care Dr. Hilma Favors 09/19/2014  Gastroenterology Dr. Deatra Ina 09/15/2014  Hematology/oncology Dr. Benay Spice: 09/17/2014  GI reconsulted 09/26/14 Dr. Fuller Plan  Procedures: R IJ CVL 12/25 >>   12/25 CT abd/pelvis: Two ovoid high-density lesions adjacent to the pancreatic stent within the jejunum could represent hemorrhage or hematoma. No additional gross evidence of surgical complication on this non IV and oral contrast exam. Potential small additional clot within the stomach is indeterminate 09/16/2014 CT abdomen and pelvis:Changes consistent with prior Whipple procedure with pancreatic stent in place. Some postoperative free fluid is noted although no organized collection to suggest abscess is noted at this time.  New main portal vein thrombus which is nonocclusive but occupies a majority of the lumen of the main portal vein. There is some differential enhancement of the liver identified which in part is likely related to this thrombus and timing of the contrast bolus.  New bilateral pleural effusions with bibasilar infiltrates particularly in the right middle lobe. This may be the etiology of the patient's elevated white blood cell count. These are new from the prior exam from 09/14/2014.  Changes of anasarca.  12/25 Admitted with UGIB to ICU. 2 units PRBCs ordered Chest x-ray 09/14/2014, 09/17/2014, 09/19/2014, 09/21/2014 KUB 09/16/2014 2-D echo 09/21/2014  Antibiotics: Ceftriaxone 12/25 >> 12/26 Zosyn 12/26 >>12/28 Rocephin 12/28>>> 09/26/14 Zosyn 1/6---  HPI/Subjective:  States have been having dark stool this morning and yesterday Feels fair Having left flank pain No fever No chills No nausea Low-grade temperature yesterday 99.6 as well as 100 degrees yesterday  Noted hypotension  Objective: Filed Vitals:   09/26/14 1231  BP: 78/48  Pulse:   Temp:   Resp:     Intake/Output Summary (Last 24 hours) at 09/26/14 1241 Last data filed at 09/26/14 1237  Gross per 24 hour  Intake   1784 ml  Output    800 ml  Net    984 ml   Filed Weights   09/24/14 0519 09/25/14 0148 09/26/14 0127  Weight: 54 kg (119 lb 0.8 oz) 50.8 kg (111 lb 15.9 oz) 50.7 kg  (111 lb 12.4 oz)  Exam:   General:  NAD  Cardiovascular: RRR  Respiratory: CTAB  Abdomen: Soft, some diffuse tenderness to palpation anteriorly however significant left flank pain without CVA tenderness, no ecchymosis.  Musculoskeletal: No clubbing cyanosis or edema.  Data Reviewed: Basic Metabolic Panel:  Recent Labs Lab 09/21/14 0510 09/22/14 0500 09/23/14 0653 09/24/14 0815 09/25/14 0525 09/26/14 0434  NA 136 136 134* 133* 131* 133*  K 2.9* 4.1 4.1 3.6 3.7 3.4*  CL 107 105 102 101 106 105  CO2 24 26 28 24 24 24   GLUCOSE 99 95 79 90 75 85  BUN 12 10 10 9 6 7   CREATININE 0.57 0.51 0.63 0.61 0.54 0.65  CALCIUM 7.3* 7.5* 7.4* 7.6* 7.2* 7.1*  MG 1.3* 1.8  --   --   --   --    Liver Function Tests:  Recent Labs Lab 09/24/14 0815  AST 26  ALT 16  ALKPHOS 156*  BILITOT 0.8  PROT 6.9  ALBUMIN 1.6*   No results for input(s): LIPASE, AMYLASE in the last 168 hours. No results for input(s): AMMONIA in the last 168 hours. CBC:  Recent Labs Lab 09/22/14 0500 09/23/14 0653 09/24/14 0815 09/25/14 0525 09/26/14 0434  WBC 7.1 5.9 7.2 5.3 7.3  HGB 9.5* 9.8* 10.2* 9.0* 6.7*  HCT 29.4* 30.5* 31.7* 28.5* 20.6*  MCV 88.8 90.2 90.6 89.6 91.6  PLT 75* 86* 105* 105* 133*   Cardiac Enzymes: No results for input(s): CKTOTAL, CKMB, CKMBINDEX, TROPONINI in the last 168 hours. BNP (last 3 results) No results for input(s): PROBNP in the last 8760 hours. CBG: No results for input(s): GLUCAP in the last 168 hours.  Recent Results (from the past 240 hour(s))  Culture, blood (routine x 2)     Status: None   Collection Time: 09/19/14  2:50 PM  Result Value Ref Range Status   Specimen Description BLOOD RIGHT ARM  Final   Special Requests BOTTLES DRAWN AEROBIC ONLY [place]  Final   Culture   Final    NO GROWTH 5 DAYS Performed at [facility]    Report Status 09/25/2014 FINAL  Final  Culture, blood (routine x 2)     Status: None   Collection Time: 09/19/14  3:05  PM  Result Value Ref Range Status   Specimen Description BLOOD RIGHT ARM  Final   Special Requests BOTTLES DRAWN AEROBIC ONLY [facility]  Final   Culture   Final    NO GROWTH 5 DAYS Performed at [facility]    Report Status 09/25/2014 FINAL  Final  Urine culture     Status: None   Collection Time: 09/20/14 10:45 AM  Result Value Ref Range Status   Specimen Description URINE, CATHETERIZED  Final   Special Requests NONE  Final   Colony Count NO GROWTH Performed at [facility]   Final   Culture NO GROWTH Performed at [facility]   Final   Report Status 09/23/2014 FINAL  Final     Studies: No results found.  Scheduled Meds: . cefTRIAXone (ROCEPHIN)  IV  2 g Intravenous Q24H  . feeding supplement (ENSURE COMPLETE)  237 mL Oral TID BM  . fentaNYL  25 mcg Transdermal Q72H  . lidocaine  1 patch Transdermal Q24H  . pantoprazole  40 mg Oral BID  . pregabalin  25 mg Oral QHS  . sodium chloride  10-40 mL Intracatheter Q12H   Continuous Infusions:    Principal Problem:   Septic shock Active Problems:  Depression   Protein-calorie malnutrition, severe   Acute GI bleeding   Hemorrhagic shock   Thrombocytopenia   Portal vein thrombosis   Sepsis   Acute respiratory failure with hypoxia   Hypokalemia   Hypomagnesemia   Sepsis associated hypotension   Bacteremia due to Klebsiella pneumoniae    Time spent: 40 mins    Verneita Griffes, MD [facility] (P) [phone]

## 2014-09-26 NOTE — Progress Notes (Signed)
Pt moved back to SDU early this am secondary to fever and recurrent hypotension. Tonight, her BP is down into the 60s with MAP 50. Getting a 1L bolus now and will increase IVF to 100 cc/hr. No hx CHF on chart. She is mentating normally and making urine. Will watch for now. Hopefully, will not need to go back on pressors. Surgery is following for her s/p whipple procedure and pt will have an EGD in am. Hgb this am had decreased and pt received 1 unit PRBCs. Hgb slightly decreased now. Will follow CBC tonight and transfuse as needed. KJKG, NP

## 2014-09-26 NOTE — Progress Notes (Signed)
Report called to [facility], RN Step down.

## 2014-09-26 NOTE — Progress Notes (Signed)
Dr Verlon Au notified that patient blood pressure is 87/46, hr of 53.  Patient just finished a unit of blood.  Patient is alert and talking on phone.  Responding appropriately to questions asked

## 2014-09-26 NOTE — Progress Notes (Signed)
Patient ID: Melinda Conrad, female   DOB: 1951/01/30, 64 y.o.   MRN: [id number] 11 Days Post-Op  Subjective: Pt had GI bleed last night.   Objective: Vital signs in last 24 hours: Temp:  [97.6 F (36.4 C)-100 F (37.8 C)] 97.6 F (36.4 C) (01/06 1713) Pulse Rate:  [33-71] 47 (01/06 1716) Resp:  [15-18] 15 (01/06 1716) BP: (78-115)/(37-61) 88/45 mmHg (01/06 1716) SpO2:  [94 %-100 %] 100 % (01/06 1716) Weight:  [111 lb 12.4 oz (50.7 kg)-118 lb 9.7 oz (53.8 kg)] 118 lb 9.7 oz (53.8 kg) (01/06 1500) Last BM Date: 09/26/14  Intake/Output from previous day: 01/05 0701 - 01/06 0700 In: 8502 [P.O.:1320; I.V.:280; IV Piggyback:50] Out: 750 [Urine:750] Intake/Output this shift: Total I/O In: 634 [P.O.:240; Blood:344; IV Piggyback:50] Out: 800 [Urine:800]  General appearance: alert, cooperative and no distress GI: soft, non-tender; non distended.  Wound nearly completely healed.  Lab Results:   Recent Labs  09/26/14 0434 09/26/14 1435  WBC 7.3 6.5  HGB 6.7* 8.0*  HCT 20.6* 25.0*  PLT 133* 129*    BMET  Recent Labs  09/25/14 0525 09/26/14 0434  NA 131* 133*  K 3.7 3.4*  CL 106 105  CO2 24 24  GLUCOSE 75 85  BUN 6 7  CREATININE 0.54 0.65  CALCIUM 7.2* 7.1*   PT/INR No results for input(s): LABPROT, INR in the last 72 hours.   Recent Labs Lab 09/24/14 0815  AST 26  ALT 16  ALKPHOS 156*  BILITOT 0.8  PROT 6.9  ALBUMIN 1.6*     Lipase     Component Value Date/Time   LIPASE <10* 09/14/2014 1721     Studies/Results: Ct Abdomen Pelvis W Contrast  09/26/2014   CLINICAL DATA:  Lower abdominal pain, bilateral flank pain, nausea, status post Whipple procedure 07/2014, recurent GI bleeding, anemia, suspected GI bleed  EXAM: CT ABDOMEN AND PELVIS WITH CONTRAST  TECHNIQUE: Multidetector CT imaging of the abdomen and pelvis was performed using the standard protocol following bolus administration of intravenous contrast.  CONTRAST:  42mL OMNIPAQUE IOHEXOL 300 MG/ML   SOLN  COMPARISON:  09/16/2014  FINDINGS: Sagittal images of the spine shows disc space flattening with vacuum disc phenomenon anterior spurring at L5-S1 level. Again noted disc bulge at L4-L5 and L5-S1 level.  Lung bases are unremarkable. Trace bilateral posterior pleural effusion with basilar atelectasis.  Again noted status post Whipple procedure. A pancreatic stent in place again noted. Again noted heterogeneous geographic pattern of enhancement of the liver without significant change from prior exam. Stable nonobstructive thrombosis in main portal vein see axial image 24.  Abdominal aorta is unremarkable. Bilateral renal artery appears patent. Again noted small amount of perihepatic and perisplenic ascites. No definite mesenteric abscess is noted. No aortic aneurysm. Atherosclerotic calcifications of distal abdominal aorta.  Contrast material noted within small bowel. There is no small bowel obstruction. Moderate stool noted in right colon and sigmoid colon. Normal appendix partially visualized in axial image 52. The uterus and adnexa are unremarkable. Mild distended urinary bladder. Small amount of pelvic free fluid noted posterior cul-de-sac. Again noted anasarca infiltration of subcutaneous fat abdominal and pelvic wall.  The spleen and adrenal glands are unremarkable. Kidneys are symmetrical in size and enhancement. No hydronephrosis or hydroureter. Delayed renal images shows bilateral renal symmetrical excretion. Small amount of free fluid noted in right paracolic gutter. No small bowel or colonic obstruction.  IMPRESSION: 1. Again noted nonobstructive small amount of thrombus in main portal vein without  change from prior exam. 2. Stable postsurgical changes post Whipple procedure. Again noted pancreatic duct stent in place. Stable heterogeneous pattern of enhancement of the liver 3. Again noted small amount of perihepatic and perisplenic ascites. Small amount of free fluid is noted in right paracolic  gutter. Anasarca infiltration subcutaneous fat abdominal and pelvic wall again noted. 4. No small bowel or colonic obstruction. Moderate stool noted in right colon and sigmoid colon. No pericecal inflammation. Normal appendix. 5. Mild distended urinary bladder. 6. No hydronephrosis or hydroureter. Bilateral renal symmetrical excretion.   Electronically Signed   By: Lahoma Crocker M.D.   On: 09/26/2014 17:04    Medications: . amLODipine  5 mg Oral Daily  . feeding supplement (ENSURE COMPLETE)  237 mL Oral TID BM  . fentaNYL  25 mcg Transdermal Q72H  . lidocaine  1 patch Transdermal Q24H  . pantoprazole  40 mg Oral BID  . piperacillin-tazobactam (ZOSYN)  IV  3.375 g Intravenous 3 times per day  . pregabalin  25 mg Oral QHS  . psyllium  1 packet Oral Daily  . sertraline  100 mg Oral q morning - 10a  . sodium chloride  10-40 mL Intracatheter Q12H      Assessment/Plan Sepsis with temperature up to 104/hypotension Hematemesis/acute GI bleed Thrombocytopenia Bacteremia on blood cultures Klebsiella Portal vein Thrombosis Chronic Ibuprofen use for ongoing pain- now stopped Anemia admit hemoglobin 5.8, improved after transfusion. 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 Barry Dienes (hospitalized 11/16-12/1/15   Plan:    No blood thinners  EGD tomorrow.   LOS: 12 days    Melinda Conrad 09/26/2014

## 2014-09-26 NOTE — Anesthesia Preprocedure Evaluation (Deleted)
Anesthesia Evaluation  Patient identified by MRN, date of birth, ID band Patient awake  General Assessment Comment:Hx of pancreatic adenocarcinoma s/p whipple presenting to the hospital with acute respiratory failure, anemia, thrombocytopenia and sepsis  Reviewed: Allergy & Precautions, NPO status , Patient's Chart, lab work & pertinent test results  History of Anesthesia Complications Negative for: history of anesthetic complications  Airway Mallampati: II  TM Distance: >3 FB Neck ROM: Full    Dental no notable dental hx. (+) Dental Advisory Given   Pulmonary Current Smoker,  breath sounds clear to auscultation  Pulmonary exam normal       Cardiovascular hypertension, Pt. on medications Rhythm:Regular Rate:Normal     Neuro/Psych PSYCHIATRIC DISORDERS Anxiety Depression negative neurological ROS     GI/Hepatic negative GI ROS, Neg liver ROS,   Endo/Other  negative endocrine ROS  Renal/GU negative Renal ROS  negative genitourinary   Musculoskeletal  (+) Arthritis -,   Abdominal   Peds negative pediatric ROS (+)  Hematology  (+) anemia ,   Anesthesia Other Findings   Reproductive/Obstetrics negative OB ROS                             Anesthesia Physical Anesthesia Plan  ASA: IV  Anesthesia Plan: MAC   Post-op Pain Management:    Induction: Intravenous  Airway Management Planned: Nasal Cannula  Additional Equipment:   Intra-op Plan:   Post-operative Plan:   Informed Consent: I have reviewed the patients History and Physical, chart, labs and discussed the procedure including the risks, benefits and alternatives for the proposed anesthesia with the patient or authorized representative who has indicated his/her understanding and acceptance.   Dental advisory given  Plan Discussed with: CRNA  Anesthesia Plan Comments:         Anesthesia Quick Evaluation

## 2014-09-26 NOTE — Progress Notes (Signed)
ANTIBIOTIC CONSULT NOTE - INITIAL  Pharmacy Consult for Zosyn Indication: Aspiration PNA, klebsiella pneumonia bacteremia  No Known Allergies  Patient Measurements: Height: 5\' 3"  (160 cm) Weight: 111 lb 12.4 oz (50.7 kg) IBW/kg (Calculated) : 52.4 Adjusted Body Weight:   Vital Signs: Temp: 98.4 F (36.9 C) (01/06 1215) Temp Source: Oral (01/06 1215) BP: 98/50 mmHg (01/06 1300) Pulse Rate: 52 (01/06 1256) Intake/Output from previous day: 01/05 0701 - 01/06 0700 In: 3500 [P.O.:1320; I.V.:280; IV Piggyback:50] Out: 750 [Urine:750] Intake/Output from this shift: Total I/O In: 584 [P.O.:240; Blood:344] Out: 400 [Urine:400]  Labs:  Recent Labs  09/24/14 0815 09/25/14 0525 09/26/14 0434  WBC 7.2 5.3 7.3  HGB 10.2* 9.0* 6.7*  PLT 105* 105* 133*  CREATININE 0.61 0.54 0.65   Estimated Creatinine Clearance: 59.1 mL/min (by C-G formula based on Cr of 0.65). No results for input(s): VANCOTROUGH, VANCOPEAK, VANCORANDOM, GENTTROUGH, GENTPEAK, GENTRANDOM, TOBRATROUGH, TOBRAPEAK, TOBRARND, AMIKACINPEAK, AMIKACINTROU, AMIKACIN in the last 72 hours.   Microbiology: Recent Results (from the past 720 hour(s))  Culture, blood (routine x 2)     Status: None   Collection Time: 09/15/14 12:10 AM  Result Value Ref Range Status   Specimen Description BLOOD R ARM  Final   Special Requests BOTTLES DRAWN AEROBIC ONLY 3CC  Final   Culture   Final    KLEBSIELLA PNEUMONIAE Note: SUSCEPTIBILITIES PERFORMED ON PREVIOUS CULTURE WITHIN THE LAST 5 DAYS. Note: Gram Stain Report Called to,Read Back By and Verified With: STEPHANIE DILLON 09/15/14 @ 5:03PM BY RUSCOE A. Performed at [facility]    Report Status 09/17/2014 FINAL  Final  MRSA PCR Screening     Status: None   Collection Time: 09/15/14 12:27 AM  Result Value Ref Range Status   MRSA by PCR NEGATIVE NEGATIVE Final    Comment:        The GeneXpert MRSA Assay (FDA approved for NASAL specimens only), is one component of  a comprehensive MRSA colonization surveillance program. It is not intended to diagnose MRSA infection nor to guide or monitor treatment for MRSA infections.   Culture, blood (routine x 2)     Status: None   Collection Time: 09/15/14 12:38 AM  Result Value Ref Range Status   Specimen Description BLOOD R IS CVC  Final   Special Requests BOTTLES DRAWN AEROBIC AND ANAEROBIC 5CC EACH  Final   Culture   Final    KLEBSIELLA PNEUMONIAE Note: Gram Stain Report Called to,Read Back By and Verified With: STEPHANIE DILLON 09/15/14 @ 5:03PM BY RUSCOE A Performed at [facility]    Report Status 09/17/2014 FINAL  Final   Organism ID, Bacteria KLEBSIELLA PNEUMONIAE  Final      Susceptibility   Klebsiella pneumoniae - MIC*    AMPICILLIN RESISTANT      AMPICILLIN/SULBACTAM 8 SENSITIVE Sensitive     CEFAZOLIN <=4 SENSITIVE Sensitive     CEFEPIME <=1 SENSITIVE Sensitive     CEFTAZIDIME <=1 SENSITIVE Sensitive     CEFTRIAXONE <=1 SENSITIVE Sensitive     CIPROFLOXACIN <=0.25 SENSITIVE Sensitive     GENTAMICIN <=1 SENSITIVE Sensitive     IMIPENEM <=0.25 SENSITIVE Sensitive     PIP/TAZO <=4 SENSITIVE Sensitive     TOBRAMYCIN <=1 SENSITIVE Sensitive     TRIMETH/SULFA <=20 SENSITIVE Sensitive     * KLEBSIELLA PNEUMONIAE  Culture, blood (routine x 2)     Status: None   Collection Time: 09/19/14  2:50 PM  Result Value Ref Range Status  Specimen Description BLOOD RIGHT ARM  Final   Special Requests BOTTLES DRAWN AEROBIC ONLY [place]  Final   Culture   Final    NO GROWTH 5 DAYS Performed at [facility]    Report Status 09/25/2014 FINAL  Final  Culture, blood (routine x 2)     Status: None   Collection Time: 09/19/14  3:05 PM  Result Value Ref Range Status   Specimen Description BLOOD RIGHT ARM  Final   Special Requests BOTTLES DRAWN AEROBIC ONLY 4CC  Final   Culture   Final    NO GROWTH 5 DAYS Performed at [facility]    Report Status 09/25/2014 FINAL  Final  Urine  culture     Status: None   Collection Time: 09/20/14 10:45 AM  Result Value Ref Range Status   Specimen Description URINE, CATHETERIZED  Final   Special Requests NONE  Final   Colony Count NO GROWTH Performed at [facility]   Final   Culture NO GROWTH Performed at [facility]   Final   Report Status 09/23/2014 FINAL  Final    Medical History: Past Medical History  Diagnosis Date  . Arthritis   . Depression   . H/O: substance abuse   . Back pain   . Personal history of colonic polyp - adenoma 10/26/2013    10/26/2013 diminutive sigmoid polyp removed    . Pancreatitis, acute   . Hypertension   . Osteoporosis   . Radiation 04/30/14-06/08/14    pancreas 50 gray  . Cancer     pancreatic cancer   Assessment: 50yoF w/ recent dx pancreatic cancer, s/p Whipple procedure 08/07/14, admitted w/ melena x several days, fevers, hematemesis found to be in hemorrhagic shock with ABLA, acute respiratory failure, AKI and thrombocytopenia.  Pt also found to have klebsiella bacteremia (source likely GI) and portal vein thrombosis.  Pharmacy familiar with patient due to anticoagulation consults, currently now discontinued 2/2 drop in hemoglobin this morning.  Pt has been on ceftriaxone to treat bacteremia and antibiotics now broadened to cover possible aspiration PNA and intra-abdominal infection.   12/26 >> Ceftrixaxone x1  12/26 >> Zosyn  >> 12/28 12/28 >> Ceftriaxone >> 1/6 1/6 >> Zosyn >>    Tmax: 100 WBCs: WNL Renal: Stable, CrCl ~60  12/26 blood: 2/2 Klebsiella pneumoniae pansensitive 12/30 blood:2/2 NGTD 12/31 urine: NGF   Goal of Therapy:  Eradication of infection  Plan:  Zosyn 3.375g IV q8h (infuse over 4 hours) F/u renal function, cultures, clinical course  Ralene Bathe, PharmD, BCPS 09/26/2014, 1:40 PM  Phone [phone]

## 2014-09-27 ENCOUNTER — Encounter: Payer: Medicaid Other | Admitting: Nutrition

## 2014-09-27 ENCOUNTER — Other Ambulatory Visit: Payer: Medicaid Other

## 2014-09-27 ENCOUNTER — Ambulatory Visit: Payer: Medicaid Other | Admitting: Nurse Practitioner

## 2014-09-27 ENCOUNTER — Other Ambulatory Visit: Payer: Self-pay

## 2014-09-27 ENCOUNTER — Encounter (HOSPITAL_COMMUNITY): Payer: Self-pay | Admitting: Anesthesiology

## 2014-09-27 ENCOUNTER — Ambulatory Visit: Payer: Medicaid Other

## 2014-09-27 LAB — COMPREHENSIVE METABOLIC PANEL
ALBUMIN: 1.2 g/dL — AB (ref 3.5–5.2)
ALBUMIN: 1.1 g/dL — AB (ref 3.5–5.2)
ALK PHOS: 240 U/L — AB (ref 39–117)
ALT: 13 U/L (ref 0–35)
ALT: 14 U/L (ref 0–35)
ANION GAP: 0 — AB (ref 5–15)
AST: 20 U/L (ref 0–37)
AST: 25 U/L (ref 0–37)
Alkaline Phosphatase: 265 U/L — ABNORMAL HIGH (ref 39–117)
Anion gap: 2 — ABNORMAL LOW (ref 5–15)
BUN: 7 mg/dL (ref 6–23)
BUN: 6 mg/dL (ref 6–23)
CHLORIDE: 117 meq/L — AB (ref 96–112)
CO2: 21 mmol/L (ref 19–32)
CO2: 19 mmol/L (ref 19–32)
CREATININE: 0.51 mg/dL (ref 0.50–1.10)
Calcium: 6.7 mg/dL — ABNORMAL LOW (ref 8.4–10.5)
Calcium: 7.1 mg/dL — ABNORMAL LOW (ref 8.4–10.5)
Chloride: 116 mEq/L — ABNORMAL HIGH (ref 96–112)
Creatinine, Ser: 0.57 mg/dL (ref 0.50–1.10)
GFR calc Af Amer: >90 mL/min (ref >90)
GFR calc Af Amer: >90 mL/min (ref >90)
GFR calc non Af Amer: >90 mL/min (ref >90)
GFR calc non Af Amer: >90 mL/min (ref >90)
GLUCOSE: 63 mg/dL — AB (ref 70–99)
Glucose, Bld: 101 mg/dL — ABNORMAL HIGH (ref 70–99)
POTASSIUM: 3.2 mmol/L — AB (ref 3.5–5.1)
POTASSIUM: 3.8 mmol/L (ref 3.5–5.1)
SODIUM: 137 mmol/L (ref 135–145)
Sodium: 135 mmol/L (ref 135–145)
TOTAL PROTEIN: 5.3 g/dL — AB (ref 6.0–8.3)
Total Bilirubin: 0.4 mg/dL (ref 0.3–1.2)
Total Bilirubin: 0.7 mg/dL (ref 0.3–1.2)
Total Protein: 5.3 g/dL — ABNORMAL LOW (ref 6.0–8.3)

## 2014-09-27 LAB — CBC WITH DIFFERENTIAL/PLATELET
BASOS PCT: 1 % (ref 0–1)
Basophils Absolute: 0 10*3/uL (ref 0.0–0.1)
Basophils Absolute: 0 10*3/uL (ref 0.0–0.1)
Basophils Relative: 1 % (ref 0–1)
EOS ABS: 0.1 10*3/uL (ref 0.0–0.7)
EOS PCT: 2 % (ref 0–5)
Eosinophils Absolute: 0.1 10*3/uL (ref 0.0–0.7)
Eosinophils Relative: 2 % (ref 0–5)
HCT: 30.7 % — ABNORMAL LOW (ref 36.0–46.0)
HEMATOCRIT: 23.5 % — AB (ref 36.0–46.0)
Hemoglobin: 7.6 g/dL — ABNORMAL LOW (ref 12.0–15.0)
Hemoglobin: 9.9 g/dL — ABNORMAL LOW (ref 12.0–15.0)
LYMPHS ABS: 0.9 10*3/uL (ref 0.7–4.0)
Lymphocytes Relative: 17 % (ref 12–46)
Lymphocytes Relative: 15 % (ref 12–46)
Lymphs Abs: 0.8 10*3/uL (ref 0.7–4.0)
MCH: 29.1 pg (ref 26.0–34.0)
MCH: 29.4 pg (ref 26.0–34.0)
MCHC: 32.3 g/dL (ref 30.0–36.0)
MCHC: 32.2 g/dL (ref 30.0–36.0)
MCV: 90 fL (ref 78.0–100.0)
MCV: 91.1 fL (ref 78.0–100.0)
MONO ABS: 0.7 10*3/uL (ref 0.1–1.0)
MONOS PCT: 11 % (ref 3–12)
Monocytes Absolute: 0.7 10*3/uL (ref 0.1–1.0)
Monocytes Relative: 14 % — ABNORMAL HIGH (ref 3–12)
NEUTROS ABS: 3.2 10*3/uL (ref 1.7–7.7)
NEUTROS PCT: 72 % (ref 43–77)
Neutro Abs: 4.2 10*3/uL (ref 1.7–7.7)
Neutrophils Relative %: 67 % (ref 43–77)
PLATELETS: 118 10*3/uL — AB (ref 150–400)
Platelets: 139 10*3/uL — ABNORMAL LOW (ref 150–400)
RBC: 2.61 MIL/uL — ABNORMAL LOW (ref 3.87–5.11)
RBC: 3.37 MIL/uL — AB (ref 3.87–5.11)
RDW: 16 % — ABNORMAL HIGH (ref 11.5–15.5)
RDW: 15.6 % — ABNORMAL HIGH (ref 11.5–15.5)
WBC: 4.9 10*3/uL (ref 4.0–10.5)
WBC: 5.9 10*3/uL (ref 4.0–10.5)

## 2014-09-27 LAB — GLUCOSE, CAPILLARY
GLUCOSE-CAPILLARY: 71 mg/dL (ref 70–99)
GLUCOSE-CAPILLARY: 64 mg/dL — AB (ref 70–99)
Glucose-Capillary: 106 mg/dL — ABNORMAL HIGH (ref 70–99)
Glucose-Capillary: 85 mg/dL (ref 70–99)

## 2014-09-27 LAB — CORTISOL: CORTISOL PLASMA: 9.3 ug/dL

## 2014-09-27 LAB — PROTIME-INR
INR: 1.37 (ref 0.00–1.49)
Prothrombin Time: 17 seconds — ABNORMAL HIGH (ref 11.6–15.2)

## 2014-09-27 LAB — TROPONIN I
TROPONIN I: 0.03 ng/mL (ref <0.031)
Troponin I: 0.04 ng/mL — ABNORMAL HIGH (ref <0.031)
Troponin I: 0.03 ng/mL (ref <0.031)

## 2014-09-27 LAB — PREPARE RBC (CROSSMATCH)

## 2014-09-27 LAB — LACTIC ACID, PLASMA: LACTIC ACID, VENOUS: 2.2 mmol/L (ref 0.5–2.2)

## 2014-09-27 LAB — PROCALCITONIN: Procalcitonin: 0.2 ng/mL

## 2014-09-27 MED ORDER — PROPOFOL 10 MG/ML IV BOLUS
INTRAVENOUS | Status: AC
  Filled 2014-09-27: qty 20

## 2014-09-27 MED ORDER — MIDAZOLAM HCL 2 MG/2ML IJ SOLN
INTRAMUSCULAR | Status: AC
  Filled 2014-09-27: qty 2

## 2014-09-27 MED ORDER — SODIUM CHLORIDE 0.9 % IV BOLUS (SEPSIS)
1000.0000 mL | Freq: Once | INTRAVENOUS | Status: AC
  Administered 2014-09-27: 1000 mL via INTRAVENOUS

## 2014-09-27 MED ORDER — FUROSEMIDE 10 MG/ML IJ SOLN: 20.0000 mg | Freq: Once | INTRAMUSCULAR | Status: DC

## 2014-09-27 MED ORDER — LIDOCAINE HCL (CARDIAC) 20 MG/ML IV SOLN
INTRAVENOUS | Status: AC
  Filled 2014-09-27: qty 5

## 2014-09-27 MED ORDER — DIPHENHYDRAMINE HCL 50 MG/ML IJ SOLN
25.0000 mg | Freq: Once | INTRAMUSCULAR | Status: AC
  Administered 2014-09-27: 25 mg via INTRAVENOUS
  Filled 2014-09-27: qty 1

## 2014-09-27 MED ORDER — POTASSIUM CHLORIDE 10 MEQ/50ML IV SOLN
INTRAVENOUS | Status: AC
  Administered 2014-09-27: 10 meq
  Filled 2014-09-27: qty 50

## 2014-09-27 MED ORDER — SODIUM CHLORIDE 0.9 % IV SOLN
Freq: Once | INTRAVENOUS | Status: AC
  Administered 2014-09-27: 08:00:00 via INTRAVENOUS

## 2014-09-27 MED ORDER — POTASSIUM CHLORIDE 10 MEQ/100ML IV SOLN
10.0000 meq | INTRAVENOUS | Status: AC
  Administered 2014-09-27 (×2): 10 meq via INTRAVENOUS
  Filled 2014-09-27 (×2): qty 100

## 2014-09-27 MED ORDER — DEXTROSE IN LACTATED RINGERS 5 % IV SOLN
INTRAVENOUS | Status: DC
  Administered 2014-09-27: 08:00:00 via INTRAVENOUS
  Administered 2014-09-29: 1000 mL via INTRAVENOUS

## 2014-09-27 MED ORDER — SODIUM CHLORIDE 0.9 % IV BOLUS (SEPSIS): 1000.0000 mL | Freq: Once | INTRAVENOUS | Status: DC

## 2014-09-27 MED ORDER — POTASSIUM CHLORIDE 20 MEQ/15ML (10%) PO SOLN
20.0000 meq | Freq: Once | ORAL | Status: AC
  Administered 2014-09-27: 20 meq via ORAL
  Filled 2014-09-27: qty 15

## 2014-09-27 NOTE — Progress Notes (Signed)
Pt has exhibited refractory low BP with maps in the 50's despite 2.5 liters of fluid. All boluses administered in two one liter doses and one 500 mL dose during the course of this shift.  HgB is stable at 7.6 as demonstrated by two separate CBC's.  Pt is oriented X 4 and not demonstrating any mentation deficits.  All other vitals are WDL with the exception of continued bradychardia.  Based on discussions with the NP, an additional bolus of 1 liter will be administered.  Additionally, I will continue to monitor the Pt closely.

## 2014-09-27 NOTE — Progress Notes (Signed)
PULMONARY / CRITICAL CARE MEDICINE   Name: Melinda Conrad MRN: [id number] DOB: [birth year]    ADMISSION DATE:  09/14/2014  INITIAL PRESENTATION:  Admitted to PCCM service via ED with melena X several days, isolated fever on evening prior to admission (to 104.0 and treated with ibuprofen), episode of hematemesis on evening of presentation to ED for which EMS was dispatched. In [facility] ED, pt hypotensive and lethargic - both improved with IVFs and PRBCs. Recent diagnosis of pancreatic cancer, s/p Whipple procedure 08/07/14. Pt indicates that she uses ibuprofen fairly regularly.   SIGNIFICANT EVENTS/STUDIES:  12/25 CT abd/pelvis: Two ovoid high-density lesions adjacent to the pancreatic stent within the jejunum could represent hemorrhage or hematoma. No additional gross evidence of surgical complication on this non IV and oral contrast exam. Potential small additional clot within the stomach is indeterminate 12/25 Admitted with UGIB to ICU. 2 units PRBCs ordered 12/29: hemodynamically stable. Added lasix for ALI vs TRALI. Started clears  12/30 PCCM s/o. Palliative care first meeting. Addressed pain management issues.  1/2: had been progressing. Tolerating diet. Platelets had improved significantly. Heparin gtt resumed for portal vein thrombosis.  1/5 was nearing anticipated d/c. Planning on d/c on augmentin for another 3 weeks per IM. Switched from heparin to LMWH 1/6 hypotensive. Low gd temp. Hgb dropped form 9 --> 6.7. Had large maroon/burgendy stool. Heparin stopped, got blood. Remained hypotensive so was transferred to SDU 1/7: PCCM asked to re-eval d/t persistent BP in 80s.   INDWELLING DEVICES:: R IJ CVL 12/25 >>   SUBJECTIVE: WOB improved    VITAL SIGNS: Temp:  [97.6 F (36.4 C)-98.8 F (37.1 C)] 98.4 F (36.9 C) (01/07 0828) Pulse Rate:  [43-71] 55 (01/07 0828) Resp:  [7-20] 13 (01/07 0828) BP: (62-110)/(20-61) 88/45 mmHg (01/07 0828) SpO2:  [94 %-100 %] 100 % (01/07  0828) Weight:  [53.8 kg (118 lb 9.7 oz)-57.5 kg (126 lb 12.2 oz)] 57.5 kg (126 lb 12.2 oz) (01/07 0500) ROOM AIR  HEMODYNAMICS:   VENTILATOR SETTINGS:   INTAKE / OUTPUT:  Intake/Output Summary (Last 24 hours) at 09/27/14 0165 Last data filed at 09/27/14 0813  Gross per 24 hour  Intake   5908 ml  Output   1650 ml  Net   4258 ml   PHYSICAL EXAMINATION: General:  Cachectic, frail pleasant, no distress. Wants to eat  Neuro: RASS 0, + F/C, cognition intact, MAEs HEENT: temporal wasting, otherwise WNL Cardiovascular:rrr Lungs: clear w/ no accessory muscle use  Abdomen: scaphoid, soft,mild tenderness peri-umbilical, +BS EXT: muscle wasting, cool, no edema, 1+ pulses  LABS:  CBC  Recent Labs Lab 09/26/14 1435 09/26/14 1945 09/27/14 0144  WBC 6.5 5.5 4.9  HGB 8.0* 7.6* 7.6*  HCT 25.0* 23.6* 23.5*  PLT 129* 127* 118*   Coag's  Recent Labs Lab 09/27/14 0144  INR 1.37   BMET  Recent Labs Lab 09/25/14 0525 09/26/14 0434 09/27/14 0144  NA 131* 133* 135  K 3.7 3.4* 3.2*  CL 106 105 117*  CO2 24 24 21   BUN 6 7 7   CREATININE 0.54 0.65 0.51  GLUCOSE 75 85 63*   Electrolytes  Recent Labs Lab 09/21/14 0510 09/22/14 0500  09/25/14 0525 09/26/14 0434 09/27/14 0144  CALCIUM 7.3* 7.5*  < > 7.2* 7.1* 6.7*  MG 1.3* 1.8  --   --   --   --   < > = values in this interval not displayed. Sepsis Markers No results for input(s): LATICACIDVEN, PROCALCITON, O2SATVEN in the last 168  hours. ABG No results for input(s): PHART, PCO2ART, PO2ART in the last 168 hours. Liver Enzymes  Recent Labs Lab 09/24/14 0815 09/27/14 0144  AST 26 20  ALT 16 13  ALKPHOS 156* 265*  BILITOT 0.8 0.4  ALBUMIN 1.6* 1.2*   Cardiac Enzymes No results for input(s): TROPONINI, PROBNP in the last 168 hours. Glucose No results for input(s): GLUCAP in the last 168 hours. CXR:   ASSESSMENT / PLAN:  PULMONARY A: No acute. Did have infiltrates prior to PCCM s/o before. Felt c/w volume  overload vs TRALI. Treated supportively w/ diuretics and O2.  P:   - Supp O2 as needed. - trend pulse ox  - f/u CXR    CARDIOVASCULAR A:  hemorrhagic shock in setting UGIB. Hgb dropped from 9 to 6.7 on 1/6 after anticoagulation was resumed on 1/2 for portal vein thrombus  P:  Complete current unit PRBC Ck lactic acid and cortisol now.  SBP goal > 85 (as she has had persistent SBP in 80s for last 48 hrs and has no evidence of organ hypoperfusion) Transduce CVP  Cycle CEs   RENAL A:   AKI - likely shock +/- NSAIDS -resolved NAG metabolic acidosis -->resolved but Cl climbing after numerous crystalloid boluses over last 48hrs Hypokalemia   P:   - Monitor BMET intermittently - Monitor I/Os - Correct electrolytes as indicated - Trend chem   GASTROINTESTINAL A:   Recurrent UGIB -source unclear, was on NSAIDs,  also consider ischemia related bleeding, suspect exacerbated by anticoagulation for the portal vein thrombosis  Pancreatic cancer - recent Whipple 08/07/14, pancreatic stents Portal vein Thrombosis -->not candidate for heparin d/t life threatening bleed Severe protein-calorie malnutrition P:   - Double dose PPI. - plan for EGD 1/8 - no more heparin   HEMATOLOGIC A:   Acute blood loss anemia Anemia of chronic illness Thrombocytopenia -->likely septic coagulopathy ->improved  F/b heme/onc  P:  - D/C'd heparin - Monitor CBC intermittently - Transfuse for hemorrhagic shock as needed    INFECTIOUS A: Klebsiella bacteremia ? Source possibly GI translocation. Most recent BC were negative. Not sure that she is infected. Does not appear toxic. ABX changed to zosyn 1/6 P:   MICRO DATA: Blood 12/25 >> Klebsiella (pan sens) Repeat cultures 12/30>>>neg  BCX2 1/7>>>  ANTIMICROBIALS:  Ceftriaxone 12/25 >> 12/26 Zosyn 12/26 >>12/28 Rocephin 12/28>>>1/6 Zosyn 1/6>>>  ENDOCRINE A:   Hypoglycemia  P:   - Monitor glu on chem panels - encourage diet when able    NEUROLOGIC A:   Post surgical and cancer related pain Very poor functional status Deconditioning  P:   - RASS goal: 0 - PRN analgesia  - Added PT consult   NP summary No distress. hgb dropped on 1/6 from 9-->6.7 now getting blood, heparin stopped on 1/6. Her SBP is in 80s. Of note she has been essentially in 80-90s for last 3 days. Her creatinine is good and she appears to have intact end-organ perfusion by exam (skin warm, cap refill brisk), Am concerned that the re-bleeding was no-doubt precipitated by the anticoagulation. We will ck lactic acid and cortisol. Have also discontinued her diuretics and antihypertensives. She is getting blood now. Would shoot for a SBP goal of > 85. Agree she needs EGD which is now planned for 1/8. Will re-culture her blood. Her last BC was negative. The plan had been for 3 weeks of augmentin as out-pt for her Klebsiella. Don't think this is necessary for this organism. We will repeat  BC again today. I think if these are negative as well stopping antibiotics at this point would be reasonable as she has been on appropriate coverage for 2 weeks already.    Erick Colace ACNP-BC [place] Pager # [phone] OR # [phone] if no answer  Critical Care attending MD statement  Attending Note:  I have examined patient, reviewed labs, studies and notes. I have discussed the case with Jerrye Bushy, and I agree with the data and plans as amended above. She has symptomatic anemia with hypotension, although she is tolerating the low BP well. Agree with holding heparin and giving PRBC. Follow BP, UOP, MS. Can likely stop abx, has been treated for Klebsiella. Independent critical care time is 45 minutes.   Baltazar Apo, MD, PhD 09/28/2014, 9:11 AM [facility] [phone] or if no answer [phone]

## 2014-09-27 NOTE — Progress Notes (Signed)
Progress Note   Subjective  Resting. No complaints. Significant other at bedside   Objective   Vital signs in last 24 hours: Temp:  [97.6 F (36.4 C)-98.8 F (37.1 C)] 98.4 F (36.9 C) (01/07 0828) Pulse Rate:  [43-71] 55 (01/07 0828) Resp:  [7-20] 13 (01/07 0828) BP: (62-110)/(20-61) 88/45 mmHg (01/07 0828) SpO2:  [94 %-100 %] 100 % (01/07 0828) Weight:  [118 lb 9.7 oz (53.8 kg)-126 lb 12.2 oz (57.5 kg)] 126 lb 12.2 oz (57.5 kg) (01/07 0500) Last BM Date: 09/26/14 General:    Thin black female in NAD Heart:  Regular rate and rhythm Abdomen:  Soft, nontender and nondistended. Normal bowel sounds. Extremities:  Without edema. Psych:  Cooperative.  Lab Results:  Recent Labs  09/26/14 1435 09/26/14 1945 09/27/14 0144  WBC 6.5 5.5 4.9  HGB 8.0* 7.6* 7.6*  HCT 25.0* 23.6* 23.5*  PLT 129* 127* 118*   BMET  Recent Labs  09/25/14 0525 09/26/14 0434 09/27/14 0144  NA 131* 133* 135  K 3.7 3.4* 3.2*  CL 106 105 117*  CO2 24 24 21   GLUCOSE 75 85 63*  BUN 6 7 7   CREATININE 0.54 0.65 0.51  CALCIUM 7.2* 7.1* 6.7*   LFT  Recent Labs  09/27/14 0144  PROT 5.3*  ALBUMIN 1.2*  AST 20  ALT 13  ALKPHOS 265*  BILITOT 0.4   PT/INR  Recent Labs  09/27/14 0144  LABPROT 17.0*  INR 1.37    Studies/Results: Ct Abdomen Pelvis W Contrast  09/26/2014   CLINICAL DATA:  Lower abdominal pain, bilateral flank pain, nausea, status post Whipple procedure 07/2014, recurent GI bleeding, anemia, suspected GI bleed  EXAM: CT ABDOMEN AND PELVIS WITH CONTRAST  TECHNIQUE: Multidetector CT imaging of the abdomen and pelvis was performed using the standard protocol following bolus administration of intravenous contrast.  CONTRAST:  35mL OMNIPAQUE IOHEXOL 300 MG/ML  SOLN  COMPARISON:  09/16/2014  FINDINGS: Sagittal images of the spine shows disc space flattening with vacuum disc phenomenon anterior spurring at L5-S1 level. Again noted disc bulge at L4-L5 and L5-S1 level.  Lung bases are  unremarkable. Trace bilateral posterior pleural effusion with basilar atelectasis.  Again noted status post Whipple procedure. A pancreatic stent in place again noted. Again noted heterogeneous geographic pattern of enhancement of the liver without significant change from prior exam. Stable nonobstructive thrombosis in main portal vein see axial image 24.  Abdominal aorta is unremarkable. Bilateral renal artery appears patent. Again noted small amount of perihepatic and perisplenic ascites. No definite mesenteric abscess is noted. No aortic aneurysm. Atherosclerotic calcifications of distal abdominal aorta.  Contrast material noted within small bowel. There is no small bowel obstruction. Moderate stool noted in right colon and sigmoid colon. Normal appendix partially visualized in axial image 52. The uterus and adnexa are unremarkable. Mild distended urinary bladder. Small amount of pelvic free fluid noted posterior cul-de-sac. Again noted anasarca infiltration of subcutaneous fat abdominal and pelvic wall.  The spleen and adrenal glands are unremarkable. Kidneys are symmetrical in size and enhancement. No hydronephrosis or hydroureter. Delayed renal images shows bilateral renal symmetrical excretion. Small amount of free fluid noted in right paracolic gutter. No small bowel or colonic obstruction.  IMPRESSION: 1. Again noted nonobstructive small amount of thrombus in main portal vein without change from prior exam. 2. Stable postsurgical changes post Whipple procedure. Again noted pancreatic duct stent in place. Stable heterogeneous pattern of enhancement of the liver 3. Again noted small amount of  perihepatic and perisplenic ascites. Small amount of free fluid is noted in right paracolic gutter. Anasarca infiltration subcutaneous fat abdominal and pelvic wall again noted. 4. No small bowel or colonic obstruction. Moderate stool noted in right colon and sigmoid colon. No pericecal inflammation. Normal appendix. 5.  Mild distended urinary bladder. 6. No hydronephrosis or hydroureter. Bilateral renal symmetrical excretion.   Electronically Signed   By: Lahoma Crocker M.D.   On: 09/26/2014 17:04      Assessment / Plan:    32. 64 year old female with history of pancreatic adenocarcinoma, s/p chemoradiation followed by whipple approximately 5 weeks ago. Post-op course complicated by sepsis, portal vein thrombosis and GI bleeding. EGD has been postponed a few times secondary to hypotension. Patient had recurrent bleeding yesterday (on anticoagulants). EGD planned for this am but had to be postponed again for hypotension. Getting IVF and blood. Will plan for am EGD if medically stable. Anticoagulants on hold.  2. Anemia of acute blood loss. Hgb declined from 9 to 6.7 yesterday. She got a unit of blood yesterday and is getting another now. Stools dark but no frank bleeding .      LOS: 13 days   Tye Savoy  09/27/2014, 9:25 AM

## 2014-09-27 NOTE — Progress Notes (Signed)
[facility] PROGRESS NOTE  Melinda Conrad:[id number] DOB: 1951-09-14 DOA: 09/14/2014 PCP: Kevan Ny, MD  Brief History  64 y/o female with history of pancreatitis, hypertension, pancreatic cancer with prior Whipple surgery (08/07/14) with preoperative radiation and chemotherapy. The patient presented to the hospital on 09/14/2014 with several days of melanotic stools, fever up to 104.25F, and hematemesis. The patient was found to be lethargic and hypotensive in the emergency department even with fluid resuscitation and PRBC infusion. She was admitted to the ICU under the critical care service, and she was started on Levophed On admission, hemoglobin was 5.8.   12/25 CT abd/pelvis showed two ovoid high-density lesions adjacent to the pancreatic stent within the jejunum could represent hemorrhage or hematoma. No additional gross evidence of surgical complication. Gastroenterology was consulted.the patient was placed on an IV Protonix drip. It was thought that the patient's GI bleed was due to chronic NSAID use vs ischemia induced from her hypotension. EGD was deferred because of the patient's persistent hypotension. Blood cultures performed at the time of admission revealed Klebsiella pneumoniae. The patient was initially started on Zosyn, but this was narrowed to ceftriaxone.   The patient continued to have persistent abdominal pain. Repeat CT of the abdomen and pelvis with contrast on 09/16/2014 was performed. It revealed a new portal vein thrombus, bilateral pleural effusions with bibasilar infiltrates . There was no abscess or leakage at her surgical sites. Gen. surgery was consulted. After careful consideration amongst all consultants due to the patient's recent GI bleed, the patient was started on a heparin drip.   The patient continued to have progressive thrombocytopenia. Heme/Onc, Dr. Benay Spice was consulted. The patient's heparin drip was subsequently held with the hopes that  with her thrombocytopenia will improved with treatment for sepsis. Thrombocytopenia improving.   Unfortunately hemoglobin dropped 9-->6.7, patient complained of left flank pain not relieved by IV pain medications Persistently hypotensive over the course of 1/5-1/6 therefore transferred back to step down unit. Noted to be on PRN Metoprolol as well as amlodipine 5 mg  Heparin completely discontinued because of new onset anemia  #1 severe sepsis Patient was admitted with sepsis felt to be secondary to her bacteremia. Patient improving clinically. Afebrile. Patient off pressors. Blood cultures with Klebsiella pneumonia likely seeded from the GI tract.  Was on IV Rocephin-->Zosyn as below BC x 2 added 1/7  #2 Klebsiella pneumonia bacteremia Likely seeded from the GI source? 12/30 surveillance blood cultures in the setting of portal vein thromboses negative X5 days. CT scan abdomen pelvis 12/27 = bibasilar infiltrates right middle lobe-new from chest x-ray 12/25 Transition antibiotics from Rocephin->Zosyn 09/26/14 Follow BC x 2 1/7  #3 hemorrhagic shock/acute blood loss anemia + s/p Whipple T3N1  Likely secondary to chronic NSAID use. Patient with no further bleeding today. Patient status post 4 units packed red blood cells. Hemoglobin currently stable. Follow.Continue PPI BID. GI was following and recommend monitoring at this point in time. Unfortunately blood count dropped to 6.7 so we are getting a CT abdomen pelvis with contrast Transfused 1 unit packed red blood cells 1/6 Transfused  1 U PRBC again 09/27/14 Saline  to 50 cc/hr D5/ns as hypotension  #4 portal vein thrombosis Anticoagulation held secondary to significant thrombocytopenia. Platelet count improving. Patient has been started empirically on IV heparin per general surgery as platelet counts have improved.  Given events #3, will need to discuss with both general surgery/oncology further plan going forward For now heparin/Lovenox  have been completely held  #5  acute respiratory failure Concern for TRALI secondary to transfusions versus acute lung injury from bacteremia.  09/27/14 with crackles RLL, ? fluids as above Patients with some shortness of breath however states improving. Patient +4L since admission.  Patient received 40 mg of IV Lasix on 12/29, 12/30, 12/31.   #6 thrombocytopenia Likely secondary to sepsis with possible DIC and consumption. Platelet function showing recovery and improving. No schistocytes on peripheral smear. Patient may have had low-grade DIC in the setting of sepsis ? Monitor Obtain hemolysis labs if no improvement in cbc's from this pm  #7 acute kidney injury Secondary to sepsis and pre-azotemia secondary to hypotension. Improved and currently at baseline. Monitor with diuresis. Resolved  #8 severe protein calorie malnutrition Patient has been seen by dietitian. Continue current supplements. Diet has been advanced. Follow.  #9 pancreatic adenocarcinoma s/p Whipple Status post Whipple's with preoperative XRT and chemotherapy. Patient has been seen by Dr. Benay Spice of oncology and chemotherapy on hold for now. Outpatient follow-up with oncology.  #10 hypokalemia/hypomagnesemia Likely secondary to diuretics. Repleted. Keep magnesium greater than 2. Keep potassium greater than 4.  #11 deconditioning/debility Patient has been seen by physical therapy recommended home health PT.  #12 prophylaxis Protonix for GI prophylaxis. SCDs for DVT prophylaxis.    Code Status: Full Family Communication: Updated patient and family present. Disposition Plan: Transfer back to stepdown unit overnight pending resolution of the events If no improvement would reconsult palliative care   Consultants:  Palliative care Dr. Hilma Favors 09/19/2014  Gastroenterology Dr. Deatra Ina 09/15/2014  Hematology/oncology Dr. Benay Spice: 09/17/2014  GI reconsulted 09/26/14 Dr. Fuller Plan  Procedures: R IJ CVL 12/25 >>    12/25 CT abd/pelvis: Two ovoid high-density lesions adjacent to the pancreatic stent within the jejunum could represent hemorrhage or hematoma. No additional gross evidence of surgical complication on this non IV and oral contrast exam. Potential small additional clot within the stomach is indeterminate 09/16/2014 CT abdomen and pelvis:Changes consistent with prior Whipple procedure with pancreatic stent in place. Some postoperative free fluid is noted although no organized collection to suggest abscess is noted at this time.  New main portal vein thrombus which is nonocclusive but occupies a majority of the lumen of the main portal vein. There is some differential enhancement of the liver identified which in part is likely related to this thrombus and timing of the contrast bolus.  New bilateral pleural effusions with bibasilar infiltrates particularly in the right middle lobe. This may be the etiology of the patient's elevated white blood cell count. These are new from the prior exam from 09/14/2014.  Changes of anasarca.  12/25 Admitted with UGIB to ICU. 2 units PRBCs ordered Chest x-ray 09/14/2014, 09/17/2014, 09/19/2014, 09/21/2014 KUB 09/16/2014 2-D echo 09/21/2014  Antibiotics: Ceftriaxone 12/25 >> 12/26 Zosyn 12/26 >>12/28 Rocephin 12/28>>> 09/26/14 Zosyn 1/6---  HPI/Subjective:  States have been having dark stool this afternoon Formed stool this pm per RN [soft formed] Feels a little better No n/v/cp Noted hypotension is resolving  CCM/GI have seen [appreciated]  Objective: Filed Vitals:   09/27/14 1200  BP:   Pulse:   Temp: 97.7 F (36.5 C)  Resp:     Intake/Output Summary (Last 24 hours) at 09/27/14 1435 Last data filed at 09/27/14 1019  Gross per 24 hour  Intake   5579 ml  Output   1250 ml  Net   4329 ml   Filed Weights   09/26/14 0127 09/26/14 1500 09/27/14 0500  Weight: 50.7 kg (111 lb 12.4 oz) 53.8 kg (  118 lb 9.7 oz) 57.5 kg (126 lb 12.2  oz)    Exam:   General:  NAD  Cardiovascular: RRR  Respiratory: Crackles in the R lower lobes  Abdomen: Soft, some diffuse tenderness to palpation anteriorly however significant left flank pain without CVA tenderness, no ecchymosis.  Musculoskeletal: No clubbing cyanosis or edema.  Data Reviewed: Basic Metabolic Panel:  Recent Labs Lab 09/21/14 0510 09/22/14 0500 09/23/14 0109 09/24/14 0815 09/25/14 0525 09/26/14 0434 09/27/14 0144  NA 136 136 134* 133* 131* 133* 135  K 2.9* 4.1 4.1 3.6 3.7 3.4* 3.2*  CL 107 105 102 101 106 105 117*  CO2 24 26 28 24 24 24 21   GLUCOSE 99 95 79 90 75 85 63*  BUN 12 10 10 9 6 7 7   CREATININE 0.57 0.51 0.63 0.61 0.54 0.65 0.51  CALCIUM 7.3* 7.5* 7.4* 7.6* 7.2* 7.1* 6.7*  MG 1.3* 1.8  --   --   --   --   --    Liver Function Tests:  Recent Labs Lab 09/24/14 0815 09/27/14 0144  AST 26 20  ALT 16 13  ALKPHOS 156* 265*  BILITOT 0.8 0.4  PROT 6.9 5.3*  ALBUMIN 1.6* 1.2*   No results for input(s): LIPASE, AMYLASE in the last 168 hours. No results for input(s): AMMONIA in the last 168 hours. CBC:  Recent Labs Lab 09/25/14 0525 09/26/14 0434 09/26/14 1435 09/26/14 1945 09/27/14 0144  WBC 5.3 7.3 6.5 5.5 4.9  NEUTROABS  --   --  4.5 3.5 3.2  HGB 9.0* 6.7* 8.0* 7.6* 7.6*  HCT 28.5* 20.6* 25.0* 23.6* 23.5*  MCV 89.6 91.6 90.6 91.1 90.0  PLT 105* 133* 129* 127* 118*   Cardiac Enzymes:  Recent Labs Lab 09/27/14 1215  TROPONINI 0.04*   BNP (last 3 results) No results for input(s): PROBNP in the last 8760 hours. CBG: No results for input(s): GLUCAP in the last 168 hours.  Recent Results (from the past 240 hour(s))  Culture, blood (routine x 2)     Status: None   Collection Time: 09/19/14  2:50 PM  Result Value Ref Range Status   Specimen Description BLOOD RIGHT ARM  Final   Special Requests BOTTLES DRAWN AEROBIC ONLY [place]  Final   Culture   Final    NO GROWTH 5 DAYS Performed at [facility]    Report  Status 09/25/2014 FINAL  Final  Culture, blood (routine x 2)     Status: None   Collection Time: 09/19/14  3:05 PM  Result Value Ref Range Status   Specimen Description BLOOD RIGHT ARM  Final   Special Requests BOTTLES DRAWN AEROBIC ONLY 4CC  Final   Culture   Final    NO GROWTH 5 DAYS Performed at [facility]    Report Status 09/25/2014 FINAL  Final  Urine culture     Status: None   Collection Time: 09/20/14 10:45 AM  Result Value Ref Range Status   Specimen Description URINE, CATHETERIZED  Final   Special Requests NONE  Final   Colony Count NO GROWTH Performed at [facility]   Final   Culture NO GROWTH Performed at [facility]   Final   Report Status 09/23/2014 FINAL  Final     Studies: Ct Abdomen Pelvis W Contrast  09/26/2014   CLINICAL DATA:  Lower abdominal pain, bilateral flank pain, nausea, status post Whipple procedure 07/2014, recurent GI bleeding, anemia, suspected GI bleed  EXAM: CT ABDOMEN  AND PELVIS WITH CONTRAST  TECHNIQUE: Multidetector CT imaging of the abdomen and pelvis was performed using the standard protocol following bolus administration of intravenous contrast.  CONTRAST:  78mL OMNIPAQUE IOHEXOL 300 MG/ML  SOLN  COMPARISON:  09/16/2014  FINDINGS: Sagittal images of the spine shows disc space flattening with vacuum disc phenomenon anterior spurring at L5-S1 level. Again noted disc bulge at L4-L5 and L5-S1 level.  Lung bases are unremarkable. Trace bilateral posterior pleural effusion with basilar atelectasis.  Again noted status post Whipple procedure. A pancreatic stent in place again noted. Again noted heterogeneous geographic pattern of enhancement of the liver without significant change from prior exam. Stable nonobstructive thrombosis in main portal vein see axial image 24.  Abdominal aorta is unremarkable. Bilateral renal artery appears patent. Again noted small amount of perihepatic and perisplenic ascites. No definite mesenteric  abscess is noted. No aortic aneurysm. Atherosclerotic calcifications of distal abdominal aorta.  Contrast material noted within small bowel. There is no small bowel obstruction. Moderate stool noted in right colon and sigmoid colon. Normal appendix partially visualized in axial image 52. The uterus and adnexa are unremarkable. Mild distended urinary bladder. Small amount of pelvic free fluid noted posterior cul-de-sac. Again noted anasarca infiltration of subcutaneous fat abdominal and pelvic wall.  The spleen and adrenal glands are unremarkable. Kidneys are symmetrical in size and enhancement. No hydronephrosis or hydroureter. Delayed renal images shows bilateral renal symmetrical excretion. Small amount of free fluid noted in right paracolic gutter. No small bowel or colonic obstruction.  IMPRESSION: 1. Again noted nonobstructive small amount of thrombus in main portal vein without change from prior exam. 2. Stable postsurgical changes post Whipple procedure. Again noted pancreatic duct stent in place. Stable heterogeneous pattern of enhancement of the liver 3. Again noted small amount of perihepatic and perisplenic ascites. Small amount of free fluid is noted in right paracolic gutter. Anasarca infiltration subcutaneous fat abdominal and pelvic wall again noted. 4. No small bowel or colonic obstruction. Moderate stool noted in right colon and sigmoid colon. No pericecal inflammation. Normal appendix. 5. Mild distended urinary bladder. 6. No hydronephrosis or hydroureter. Bilateral renal symmetrical excretion.   Electronically Signed   By: Lahoma Crocker M.D.   On: 09/26/2014 17:04    Scheduled Meds: . sodium chloride   Intravenous Once  . feeding supplement (ENSURE COMPLETE)  237 mL Oral TID BM  . fentaNYL  25 mcg Transdermal Q72H  . lidocaine  1 patch Transdermal Q24H  . pantoprazole  40 mg Oral BID  . piperacillin-tazobactam (ZOSYN)  IV  3.375 g Intravenous 3 times per day  . pregabalin  25 mg Oral QHS   . psyllium  1 packet Oral Daily  . sertraline  100 mg Oral q morning - 10a  . sodium chloride  10-40 mL Intracatheter Q12H   Continuous Infusions: . sodium chloride 20 mL/hr at 09/26/14 2245  . sodium chloride 100 mL/hr at 09/26/14 2043  . dextrose 5% lactated ringers 100 mL/hr at 09/27/14 1100    Principal Problem:   Septic shock Active Problems:   Depression   Protein-calorie malnutrition, severe   Acute GI bleeding   Hemorrhagic shock   Thrombocytopenia   Portal vein thrombosis   Sepsis   Acute respiratory failure with hypoxia   Hypokalemia   Hypomagnesemia   Sepsis associated hypotension   Bacteremia due to Klebsiella pneumoniae   Anemia due to acute blood loss    Time spent: 35 mins    Verneita Griffes, MD  [facility] (P) [phone]

## 2014-09-27 NOTE — Progress Notes (Signed)
Patient ID: Melinda Conrad, female   DOB: [birth year], 64 y.o.   MRN: [id number] 12 Days Post-Op   Subjective: Pt had GI bleeding again last night with drop in HCT and hypotension. Objective: Vital signs in last 24 hours: Temp:  [97.3 F (36.3 C)-98.8 F (37.1 C)] 97.3 F (36.3 C) (01/07 1600) Pulse Rate:  [43-58] 48 (01/07 1019) Resp:  [7-20] 14 (01/07 1600) BP: (62-100)/(20-57) 99/52 mmHg (01/07 1600) SpO2:  [98 %-100 %] 99 % (01/07 1600) Weight:  [126 lb 12.2 oz (57.5 kg)] 126 lb 12.2 oz (57.5 kg) (01/07 0500) Last BM Date: 09/27/14  Intake/Output from previous day: 01/06 0701 - 01/07 0700 In: 5778 [P.O.:480; I.V.:1204; Blood:344; IV [id number]:6948] Out: 5462 [Urine:1650] Intake/Output this shift: Total I/O In: 1885 [P.O.:1000; I.V.:400; Blood:335; IV Piggyback:150] Out: 325 [Urine:325]  General appearance: alert, cooperative and no distress GI: soft, non-tender; non distended.  Wound nearly completely healed.  Lab Results:   Recent Labs  09/27/14 0144 09/27/14 1215  WBC 4.9 5.9  HGB 7.6* 9.9*  HCT 23.5* 30.7*  PLT 118* 139*    BMET  Recent Labs  09/27/14 0144 09/27/14 1215  NA 135 137  K 3.2* 3.8  CL 117* 116*  CO2 21 19  GLUCOSE 63* 101*  BUN 7 6  CREATININE 0.51 0.57  CALCIUM 6.7* 7.1*   PT/INR  Recent Labs  09/27/14 0144  LABPROT 17.0*  INR 1.37     Recent Labs Lab 09/24/14 0815 09/27/14 0144 09/27/14 1215  AST 26 20 25   ALT 16 13 14   ALKPHOS 156* 265* 240*  BILITOT 0.8 0.4 0.7  PROT 6.9 5.3* 5.3*  ALBUMIN 1.6* 1.2* 1.1*     Lipase     Component Value Date/Time   LIPASE <10* 09/14/2014 1721     Studies/Results: Ct Abdomen Pelvis W Contrast  09/26/2014   CLINICAL DATA:  Lower abdominal pain, bilateral flank pain, nausea, status post Whipple procedure 07/2014, recurent GI bleeding, anemia, suspected GI bleed  EXAM: CT ABDOMEN AND PELVIS WITH CONTRAST  TECHNIQUE: Multidetector CT imaging of the abdomen and pelvis was performed  using the standard protocol following bolus administration of intravenous contrast.  CONTRAST:  51mL OMNIPAQUE IOHEXOL 300 MG/ML  SOLN  COMPARISON:  09/16/2014  FINDINGS: Sagittal images of the spine shows disc space flattening with vacuum disc phenomenon anterior spurring at L5-S1 level. Again noted disc bulge at L4-L5 and L5-S1 level.  Lung bases are unremarkable. Trace bilateral posterior pleural effusion with basilar atelectasis.  Again noted status post Whipple procedure. A pancreatic stent in place again noted. Again noted heterogeneous geographic pattern of enhancement of the liver without significant change from prior exam. Stable nonobstructive thrombosis in main portal vein see axial image 24.  Abdominal aorta is unremarkable. Bilateral renal artery appears patent. Again noted small amount of perihepatic and perisplenic ascites. No definite mesenteric abscess is noted. No aortic aneurysm. Atherosclerotic calcifications of distal abdominal aorta.  Contrast material noted within small bowel. There is no small bowel obstruction. Moderate stool noted in right colon and sigmoid colon. Normal appendix partially visualized in axial image 52. The uterus and adnexa are unremarkable. Mild distended urinary bladder. Small amount of pelvic free fluid noted posterior cul-de-sac. Again noted anasarca infiltration of subcutaneous fat abdominal and pelvic wall.  The spleen and adrenal glands are unremarkable. Kidneys are symmetrical in size and enhancement. No hydronephrosis or hydroureter. Delayed renal images shows bilateral renal symmetrical excretion. Small amount of free fluid noted in right paracolic  gutter. No small bowel or colonic obstruction.  IMPRESSION: 1. Again noted nonobstructive small amount of thrombus in main portal vein without change from prior exam. 2. Stable postsurgical changes post Whipple procedure. Again noted pancreatic duct stent in place. Stable heterogeneous pattern of enhancement of the  liver 3. Again noted small amount of perihepatic and perisplenic ascites. Small amount of free fluid is noted in right paracolic gutter. Anasarca infiltration subcutaneous fat abdominal and pelvic wall again noted. 4. No small bowel or colonic obstruction. Moderate stool noted in right colon and sigmoid colon. No pericecal inflammation. Normal appendix. 5. Mild distended urinary bladder. 6. No hydronephrosis or hydroureter. Bilateral renal symmetrical excretion.   Electronically Signed   By: Lahoma Crocker M.D.   On: 09/26/2014 17:04    Medications: . sodium chloride   Intravenous Once  . feeding supplement (ENSURE COMPLETE)  237 mL Oral TID BM  . fentaNYL  25 mcg Transdermal Q72H  . lidocaine  1 patch Transdermal Q24H  . pantoprazole  40 mg Oral BID  . piperacillin-tazobactam (ZOSYN)  IV  3.375 g Intravenous 3 times per day  . pregabalin  25 mg Oral QHS  . psyllium  1 packet Oral Daily  . sertraline  100 mg Oral q morning - 10a  . sodium chloride  10-40 mL Intracatheter Q12H   . sodium chloride 20 mL/hr at 09/26/14 2245  . sodium chloride 50 mL/hr (09/27/14 1445)  . dextrose 5% lactated ringers 50 mL/hr at 09/27/14 1445    Assessment/Plan Sepsis with temperature up to 104/hypotension Hematemesis/acute GI bleed Thrombocytopenia Bacteremia on blood cultures Klebsiella Portal vein Thrombosis Chronic Ibuprofen use for ongoing pain- now stopped Anemia admit hemoglobin 5.8, improved after transfusion. 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 Barry Dienes (hospitalized 11/16-12/1/15   Plan:    No blood thinners   patient getting transfused.  EGD today.   LOS: 13 days    [name] 09/27/2014

## 2014-09-28 ENCOUNTER — Inpatient Hospital Stay (HOSPITAL_COMMUNITY): Payer: Medicaid Other

## 2014-09-28 ENCOUNTER — Encounter (HOSPITAL_COMMUNITY): Payer: Self-pay

## 2014-09-28 ENCOUNTER — Encounter (HOSPITAL_COMMUNITY)
Admission: EM | Disposition: A | Discharge status: Home Health | Payer: Self-pay | Source: Home / Self Care | Attending: Family Medicine

## 2014-09-28 HISTORY — PX: ESOPHAGOGASTRODUODENOSCOPY: SHX5428

## 2014-09-28 LAB — PROCALCITONIN: PROCALCITONIN: 0.2 ng/mL

## 2014-09-28 LAB — COMPREHENSIVE METABOLIC PANEL
ALT: 14 U/L (ref 0–35)
ANION GAP: 6 (ref 5–15)
AST: 24 U/L (ref 0–37)
Albumin: 1.1 g/dL — ABNORMAL LOW (ref 3.5–5.2)
Alkaline Phosphatase: 269 U/L — ABNORMAL HIGH (ref 39–117)
BILIRUBIN TOTAL: 0.6 mg/dL (ref 0.3–1.2)
BUN: 6 mg/dL (ref 6–23)
CHLORIDE: 111 meq/L (ref 96–112)
CO2: 19 mmol/L (ref 19–32)
CREATININE: 0.64 mg/dL (ref 0.50–1.10)
Calcium: 7.2 mg/dL — ABNORMAL LOW (ref 8.4–10.5)
GFR calc non Af Amer: >90 mL/min (ref >90)
GLUCOSE: 65 mg/dL — AB (ref 70–99)
POTASSIUM: 3.6 mmol/L (ref 3.5–5.1)
Sodium: 136 mmol/L (ref 135–145)
TOTAL PROTEIN: 5.4 g/dL — AB (ref 6.0–8.3)

## 2014-09-28 LAB — CBC
HCT: 29.1 % — ABNORMAL LOW (ref 36.0–46.0)
HEMOGLOBIN: 9.6 g/dL — AB (ref 12.0–15.0)
MCH: 29.3 pg (ref 26.0–34.0)
MCHC: 33 g/dL (ref 30.0–36.0)
MCV: 88.7 fL (ref 78.0–100.0)
PLATELETS: 158 10*3/uL (ref 150–400)
RBC: 3.28 MIL/uL — AB (ref 3.87–5.11)
RDW: 15.8 % — AB (ref 11.5–15.5)
WBC: 5.5 10*3/uL (ref 4.0–10.5)

## 2014-09-28 LAB — GLUCOSE, CAPILLARY
GLUCOSE-CAPILLARY: 119 mg/dL — AB (ref 70–99)
Glucose-Capillary: 76 mg/dL (ref 70–99)
Glucose-Capillary: 68 mg/dL — ABNORMAL LOW (ref 70–99)
Glucose-Capillary: 55 mg/dL — ABNORMAL LOW (ref 70–99)

## 2014-09-28 SURGERY — EGD (ESOPHAGOGASTRODUODENOSCOPY)
Anesthesia: Monitor Anesthesia Care

## 2014-09-28 SURGERY — EGD (ESOPHAGOGASTRODUODENOSCOPY)
Anesthesia: Moderate Sedation

## 2014-09-28 MED ORDER — SUCRALFATE 1 GM/10ML PO SUSP
1.0000 g | Freq: Three times a day (TID) | ORAL | Status: DC
  Administered 2014-09-28 – 2014-09-30 (×7): 1 g via ORAL
  Filled 2014-09-28 (×7): qty 10

## 2014-09-28 MED ORDER — FENTANYL CITRATE 0.05 MG/ML IJ SOLN
INTRAMUSCULAR | Status: AC
  Filled 2014-09-28: qty 2

## 2014-09-28 MED ORDER — DEXTROSE 50 % IV SOLN
INTRAVENOUS | Status: AC
  Administered 2014-09-28: 06:00:00
  Filled 2014-09-28: qty 50

## 2014-09-28 MED ORDER — BUTAMBEN-TETRACAINE-BENZOCAINE 2-2-14 % EX AERO
INHALATION_SPRAY | CUTANEOUS | Status: DC | PRN
  Administered 2014-09-28: 2 via TOPICAL

## 2014-09-28 MED ORDER — MIDAZOLAM HCL 10 MG/2ML IJ SOLN
INTRAMUSCULAR | Status: DC | PRN
  Administered 2014-09-28: 2 mg via INTRAVENOUS
  Administered 2014-09-28: 1 mg via INTRAVENOUS
  Administered 2014-09-28: 2 mg via INTRAVENOUS

## 2014-09-28 MED ORDER — FENTANYL CITRATE 0.05 MG/ML IJ SOLN
INTRAMUSCULAR | Status: DC | PRN
  Administered 2014-09-28 (×2): 25 ug via INTRAVENOUS

## 2014-09-28 MED ORDER — MIDAZOLAM HCL 10 MG/2ML IJ SOLN
INTRAMUSCULAR | Status: AC
  Filled 2014-09-28: qty 2

## 2014-09-28 NOTE — Interval H&P Note (Signed)
History and Physical Interval Note:  09/28/2014 7:45 AM  Melinda Conrad  has presented today for surgery, with the diagnosis of gib  The various methods of treatment have been discussed with the patient and family. After consideration of risks, benefits and other options for treatment, the patient has consented to  Procedure(s): ESOPHAGOGASTRODUODENOSCOPY (EGD) (N/A) as a surgical intervention .  The patient's history has been reviewed, patient examined, no change in status, stable for surgery.  I have reviewed the patient's chart and labs.  Questions were answered to the patient's satisfaction.     Pricilla Riffle. Fuller Plan MD

## 2014-09-28 NOTE — Progress Notes (Signed)
PULMONARY / CRITICAL CARE MEDICINE   Name: Melinda Conrad MRN: [id number] DOB: December 27, 1950    ADMISSION DATE:  09/14/2014  INITIAL PRESENTATION:  Admitted to PCCM service via ED with melena X several days, isolated fever on evening prior to admission (to 104.0 and treated with ibuprofen), episode of hematemesis on evening of presentation to ED for which EMS was dispatched. In [facility] [place] [facility], pt hypotensive and lethargic - both improved with IVFs and PRBCs. Recent diagnosis of pancreatic cancer, s/p Whipple procedure 08/07/14. Pt indicates that she uses ibuprofen fairly regularly.   SIGNIFICANT EVENTS/STUDIES:  12/25 CT abd/pelvis: Two ovoid high-density lesions adjacent to the pancreatic stent within the jejunum could represent hemorrhage or hematoma. No additional gross evidence of surgical complication on this non IV and oral contrast exam. Potential small additional clot within the stomach is indeterminate 12/25 Admitted with UGIB to ICU. 2 units PRBCs ordered 12/29: hemodynamically stable. Added lasix for ALI vs TRALI. Started clears  12/30 PCCM s/o. Palliative care first meeting. Addressed pain management issues.  1/2: had been progressing. Tolerating diet. Platelets had improved significantly. Heparin gtt resumed for portal vein thrombosis.  1/5 was nearing anticipated d/c. Planning on d/c on augmentin for another 3 weeks per IM. Switched from heparin to LMWH 1/6 hypotensive. Low gd temp. Hgb dropped form 9 --> 6.7. Had large maroon/burgendy stool. Heparin stopped, got blood. Remained hypotensive so was transferred to SDU 1/7: PCCM asked to re-eval d/t persistent BP in 80s.  1/8: EGD: friable anastomosis site likely reason for bleeding   INDWELLING DEVICES:: R IJ CVL 12/25 >>   SUBJECTIVE: WOB improved    VITAL SIGNS: Temp:  [97.3 F (36.3 C)-98.7 F (37.1 C)] 98.7 F (37.1 C) (01/08 0714) Pulse Rate:  [44-71] 44 (01/08 0925) Resp:  [11-21] 14 (01/08 0925) BP: (80-122)/(37-70)  105/40 mmHg (01/08 0925) SpO2:  [94 %-100 %] 99 % (01/08 0925) Weight:  [59.2 kg (130 lb 8.2 oz)] 59.2 kg (130 lb 8.2 oz) (01/08 0441) ROOM AIR  HEMODYNAMICS:   VENTILATOR SETTINGS:   INTAKE / OUTPUT:  Intake/Output Summary (Last 24 hours) at 09/28/14 0950 Last data filed at 09/28/14 0600  Gross per 24 hour  Intake 4322.5 ml  Output    325 ml  Net 3997.5 ml   PHYSICAL EXAMINATION: General:  Cachectic, frail pleasant, no distress.  Neuro: RASS 0, + F/C, cognition intact, MAEs HEENT: temporal wasting, otherwise WNL Cardiovascular:rrr Lungs: clear w/ no accessory muscle use, faint rales in posterior bases  Abdomen: scaphoid, soft,mild tenderness peri-umbilical, +BS EXT: muscle wasting, cool, no edema, 1+ pulses  LABS:  CBC  Recent Labs Lab 09/27/14 0144 09/27/14 1215 09/28/14 0500  WBC 4.9 5.9 5.5  HGB 7.6* 9.9* 9.6*  HCT 23.5* 30.7* 29.1*  PLT 118* 139* 158   Coag's  Recent Labs Lab 09/27/14 0144  INR 1.37   BMET  Recent Labs Lab 09/27/14 0144 09/27/14 1215 09/28/14 0500  NA 135 137 136  K 3.2* 3.8 3.6  CL 117* 116* 111  CO2 21 19 19   BUN 7 6 6   CREATININE 0.51 0.57 0.64  GLUCOSE 63* 101* 65*   Electrolytes  Recent Labs Lab 09/22/14 0500  09/27/14 0144 09/27/14 1215 09/28/14 0500  CALCIUM 7.5*  < > 6.7* 7.1* 7.2*  MG 1.8  --   --   --   --   < > = values in this interval not displayed. Sepsis Markers  Recent Labs Lab 09/27/14 1215 09/27/14 1451 09/28/14 0500  LATICACIDVEN 2.2  --   --   PROCALCITON  --  0.20 0.20   ABG No results for input(s): PHART, PCO2ART, PO2ART in the last 168 hours. Liver Enzymes  Recent Labs Lab 09/27/14 0144 09/27/14 1215 09/28/14 0500  AST 20 25 24   ALT 13 14 14   ALKPHOS 265* 240* 269*  BILITOT 0.4 0.7 0.6  ALBUMIN 1.2* 1.1* 1.1*   Cardiac Enzymes  Recent Labs Lab 09/27/14 1215 09/27/14 1830 09/27/14 2151  TROPONINI 0.04* 0.03 0.03   Glucose  Recent Labs Lab 09/27/14 1908  09/27/14 2308 09/27/14 2349 09/28/14 0315 09/28/14 0549 09/28/14 0626  GLUCAP 76 64* 85 68* 55* 119*   CXR:   ASSESSMENT / PLAN:  PULMONARY A: No acute. Did have infiltrates prior to PCCM s/o before. Felt c/w volume overload vs TRALI. Treated supportively w/ diuretics and O2. At this point remains on room air P:   - Supp O2 as needed. - trend pulse ox  - f/u CXR    CARDIOVASCULAR A:  hemorrhagic shock in setting UGIB. Hgb dropped from 9 to 6.7 on 1/6 after anticoagulation was resumed on 1/2 for portal vein thrombus  Shock appears resolved.  P:  SBP goal > 85 (as she has had persistent SBP in 80s for last 48 hrs and has no evidence of organ hypoperfusion) Consider stress dose steroids if hypotensive again (cort <10) Keep euvolemic at this point.   RENAL A:   AKI - likely shock +/- NSAIDS -resolved NAG metabolic acidosis -->resolved   P:   - Monitor BMET intermittently - Monitor I/Os - Correct electrolytes as indicated - Trend chem   GASTROINTESTINAL A:   Recurrent UGIB -source unclear, was on NSAIDs,  also consider ischemia related bleeding, suspect exacerbated by anticoagulation for the portal vein thrombosis.  Friable anastomosis site felt to be etiology after EGD on 1/8  Pancreatic cancer - recent Whipple 08/07/14, pancreatic stents Portal vein Thrombosis -->not candidate for heparin d/t life threatening bleed Severe protein-calorie malnutrition P:   - Double dose PPI. - carafate per GO - no more heparin   HEMATOLOGIC A:   Acute blood loss anemia: friable anastomosis site from whipple  Anemia of chronic illness Thrombocytopenia -->likely septic coagulopathy ->improved  F/b heme/onc  P:  - D/C'd heparin  No further anticoagulation or NSAIDs.  - Monitor CBC intermittently - Transfuse for hemorrhagic shock as needed    INFECTIOUS A: Klebsiella bacteremia ? Source possibly GI translocation. Most recent BC were negative. Not sure that she is infected.  Does not appear toxic. ABX changed to zosyn 1/6 P:   MICRO DATA: Blood 12/25 >> Klebsiella (pan sens) Repeat cultures 12/30>>>neg  BCX2 1/7>>>  ANTIMICROBIALS:  Ceftriaxone 12/25 >> 12/26 Zosyn 12/26 >>12/28 Rocephin 12/28>>>1/6 Zosyn 1/6>>> If these f/u cultures are negative would stop abx. No data to support 3 weeks for a Klebsiella bacteremia   ENDOCRINE A:   Hypoglycemia  Relative adrenal insuff.  Her random cort was 9.3  P:   - Monitor glu on chem panels - encourage diet when able  - stress dose steroids if gets hypotensive again   NEUROLOGIC A:   Post surgical and cancer related pain Very poor functional status Deconditioning  P:   - PRN analgesia  - Cont PT consult   NP summary No distress. hgb dropped on 1/6 from 9-->6.7 got blood, heparin stopped on 1/6. Hgb now 9.6. EGD completed and found friable anastomosis site as probable cause of bleeding. No more anticoagulation OR  NSAIDs. Would shoot for a SBP goal of > 85. If hypotensive again could stress dose steroids as his cortisol was low. BC were sent on 1/7, if these are negative you can stop abx. No role to continue abx for 3 weeks for Klebsiella bacteremia. Has already had adequate therapy. We will s/o. Call again if needed.   Erick Colace ACNP-BC [place] Pager # [phone] OR # [phone] if no answer  Critical Care attending MD statement  I have examined patient, reviewed labs, studies and notes. I have discussed the case with Jerrye Bushy, and I agree with the data and plans as amended above.   Baltazar Apo, MD, PhD 09/28/2014, 5:56 PM [facility] [phone] or if no answer [phone]

## 2014-09-28 NOTE — H&P (View-Only) (Signed)
     [facility] Progress Note  Subjective:  Called back to see patient because of drop in Hgb and large maroon/burgundy stool with concern for re-bleeding.  Never received EGD for evaluation because bleeding had stopped and had issues with hypotension/sepsis.  Objective:  Vital signs in last 24 hours: Temp:  [97.6 F (36.4 C)-100 F (37.8 C)] 98.4 F (36.9 C) (01/06 1215) Pulse Rate:  [52-71] 52 (01/06 1256) Resp:  [16-18] 18 (01/06 1215) BP: (78-115)/(37-61) 98/50 mmHg (01/06 1300) SpO2:  [97 %-100 %] 100 % (01/06 1215) Weight:  [111 lb 12.4 oz (50.7 kg)] 111 lb 12.4 oz (50.7 kg) (01/06 0127) Last BM Date: 09/25/14 General:  Alert, Well-developed, in NAD Heart:  Bradycardic Pulm:  CTAB. Abdomen:  Soft, non-distended.  BS present.  Dressing noted.  Mild diffuse TTP.   Extremities:  Without edema. Neurologic:  Alert and  oriented x4;  grossly normal neurologically. Psych:  Alert and cooperative. Normal mood and affect.  Intake/Output from previous day: 01/05 0701 - 01/06 0700 In: 1219 [P.O.:1320; I.V.:280; IV Piggyback:50] Out: 750 [Urine:750] Intake/Output this shift: Total I/O In: 584 [P.O.:240; Blood:344] Out: 400 [Urine:400]  Lab Results:  Recent Labs  09/24/14 0815 09/25/14 0525 09/26/14 0434  WBC 7.2 5.3 7.3  HGB 10.2* 9.0* 6.7*  HCT 31.7* 28.5* 20.6*  PLT 105* 105* 133*   BMET  Recent Labs  09/24/14 0815 09/25/14 0525 09/26/14 0434  NA 133* 131* 133*  K 3.6 3.7 3.4*  CL 101 106 105  CO2 24 24 24   GLUCOSE 90 75 85  BUN 9 6 7   CREATININE 0.61 0.54 0.65  CALCIUM 7.6* 7.2* 7.1*   LFT  Recent Labs  09/24/14 0815  PROT 6.9  ALBUMIN 1.6*  AST 26  ALT 16  ALKPHOS 156*  BILITOT 0.8   Assessment / Plan: 45. 64 year old female with history of pancreatic adenocarcinoma, s/p chemoradiation followed by whipple approximately one month ago. Currently admitted with sepsis (Klebsiella bacteremia) / melena / hematemesis in setting of NSAID  use. EGD had been on hold secondary to hypotension and the fact that bleeding had stopped, but now with concern for recurrent bleeding.  On PO PPI BID currently.    2. PVT:  Was on Lovenox most recently, but last dose was 1600 on 1/5; it is now on hold.  3. Anemia of acute blood loss:  Hgb down to 6.7 grams from 9.0 grams yesterday.  Improved to 8.0 grams after one unit PRBC's.  CT scan has been ordered by hospitalist to rule out RP bleed, etc.  *Hold anticoagulants *Will plan for EGD tomorrow, 1/7.   *Monitor Hgb and transfuse further prn. *Continue BID PPI. *Can have clear liquids this evening then NPO after midnight.   LOS: 12 days   ZEHR, JESSICA D.  09/26/2014, 2:46 PM  Pager number [phone]     Attending physician's note   I have taken an interval history, reviewed the chart and examined the patient. I agree with the Advanced Practitioner's note, impression and recommendations. Complicated patient S/P Whipple in 07/2014 with recurrent GI bleeding and ABL anemia, suspected UGI source. Has PVT currently managed on Lovenox. Anticoagulants now on hold. PPI bid for now. Avoid ASA/NSAIDs. EGD tomorrow.  Pricilla Riffle. Fuller Plan, MD [facility]

## 2014-09-28 NOTE — Progress Notes (Addendum)
[facility] PROGRESS NOTE  Melinda Conrad VHQ:[id number] DOB: Jan 09, 1951 DOA: 09/14/2014 PCP: Kevan Ny, MD  Brief History  64 y/o female with history of pancreatitis, hypertension, pancreatic cancer with prior Whipple surgery (08/07/14) with preoperative radiation and chemotherapy. Admit to ICU12/25/2015 hemoglobin was 5.8. with several days of melanotic stools, fever up to 104.62F, and hematemesis.    12/25 CT abd/pelvis showed two ovoid high-density lesions adjacent to the pancreatic stent within the jejunum could represent hemorrhage or hematoma. No additional gross evidence of surgical complication. Gastroenterology was consulted.the patient was placed on an IV Protonix drip.  ?GI bleed was due to chronic NSAID use vs ischemia induced from her hypotension.  EGD was deferred because of the patient's persistent hypotension.  Blood cultures performed at the time of admission revealed Klebsiella pneumoniae.  The patient was initially started on Zosyn, but this was narrowed to ceftriaxone.   CT abd/p for abd pain 09/16/2014 w=new portal vein thrombus, bilateral pleural effusions with bibasilar infiltrates .  Gen. surgery was consulted. After careful consideration amongst all consultants due to the patient's recent GI bleed, the patient was started on a heparin drip.   The patient continued to have progressive thrombocytopenia. Heme/Onc, Dr. Benay Spice was consulted. The patient's heparin drip was subsequently held with the hopes that with her thrombocytopenia will improved with treatment for sepsis. Thrombocytopenia improving.   Unfortunately hemoglobin dropped 9-->6.7, patient complained of left flank pain not relieved by IV pain medications Persistently hypotensive over the course of 1/5-1/6 therefore transferred back to step down unit. Noted to be on PRN Metoprolol as well as amlodipine 5 mg Heparin completely discontinued because of new onset anemia  #1 severe sepsis Patient  was admitted with sepsis felt to be secondary to her bacteremia. Patient improving clinically. Afebrile. Patient off pressors. Blood cultures with Klebsiella pneumonia likely seeded from the GI tract.  Was on IV Rocephin-->Zosyn as below CXR showed persisting PNA Await BC x 2 09/27/14  As PCt is 0.2, will d/c IV abx  #2 Klebsiella pneumonia bacteremia Likely seeded from the GI source? 12/30 surveillance blood cultures in the setting of portal vein thromboses negative X5 days. CT scan abdomen pelvis 12/27 = bibasilar infiltrates right middle lobe-new from chest x-ray 12/25 Transition antibiotics from Rocephin->Zosyn 09/26/14 Follow BC x 2 1/7  #3 hemorrhagic shock/acute blood loss anemia + s/p Whipple T3N1  Likely secondary to chronic NSAID use. Patient with no further bleeding today. Patient status post 4 units packed red blood cells. Hemoglobin currently stable. Follow.Continue PPI BID. GI was following and recommend monitoring at this point in time. Unfortunately blood count dropped to 6.7 so we are getting a CT abdomen pelvis with contrast 1/6 Transfused 1 unit packed red blood cells 1/6 Transfused  1 U PRBC again 09/27/14 Saline locked 2/2 to resolution hypotension 1/8 Endoscopy 1/8=Friable anastamosis with bleeding Started by GI on soft clear liquids and carafate  #4 portal vein thrombosis Anticoagulation held secondary to significant thrombocytopenia. Platelet count improving. Patient has been started empirically on IV heparin per general surgery as platelet counts have improved.  Given events #3, will need to discuss with both general surgery/oncology further plan going forward For now heparin/Lovenox have been completely held  #5 acute respiratory failure Concern for TRALI secondary to transfusions versus acute lung injury from bacteremia.  09/27/14 with crackles RLL Patients with some shortness of breath however states improving.  Patient +4L since admission.  Hold 40 mg of IV Lasix  on 12/29, 12/30, 12/31.   #  6 thrombocytopenia Likely secondary to sepsis with possible DIC and consumption. Platelet function showing recovery and improving. No schistocytes on peripheral smear. Patient may have had low-grade DIC in the setting of sepsis ? Monitor Obtain hemolysis labs if no improvement in cbc's from this pm  #7 acute kidney injury Secondary to sepsis and pre-azotemia secondary to hypotension. Improved and currently at baseline. Monitor with diuresis. Resolved  #8 severe protein calorie malnutrition Patient has been seen by dietitian. Continue current supplements. Diet has been advanced. Follow.  #9 pancreatic adenocarcinoma s/p Whipple Status post Whipple's with preoperative XRT and chemotherapy. Patient has been seen by Dr. Benay Spice of oncology and chemotherapy on hold for now. Outpatient follow-up with oncology.  #10 hypokalemia/hypomagnesemia Likely secondary to diuretics. Repleted.   #11 deconditioning/debility Patient has been seen by physical therapy recommended home health PT.  #12 prophylaxis Protonix for GI prophylaxis. SCDs for DVT prophylaxis.    Code Status: Full Family Communication: Updated patient and family present. Disposition Plan: Transfer back to stepdown unit overnight pending resolution of the events If no improvement would reconsult palliative care   Consultants:  Palliative care Dr. Hilma Favors 09/19/2014  Gastroenterology Dr. Deatra Ina 09/15/2014  Hematology/oncology Dr. Benay Spice: 09/17/2014  GI reconsulted 09/26/14 Dr. Fuller Plan  Procedures: R IJ CVL 12/25 >>  12/25 CT abd/pelvis: Two ovoid high-density lesions adjacent to the pancreatic stent within the jejunum could represent hemorrhage or hematoma. No additional gross evidence of surgical complication on this non IV and oral contrast exam. Potential small additional clot within the stomach is indeterminate 09/16/2014 CT abdomen and pelvis:Changes consistent with prior Whipple procedure  with pancreatic stent in place. Some postoperative free fluid is noted although no organized collection to suggest abscess is noted at this time.  New main portal vein thrombus which is nonocclusive but occupies a majority of the lumen of the main portal vein. There is some differential enhancement of the liver identified which in part is likely related to this thrombus and timing of the contrast bolus.  New bilateral pleural effusions with bibasilar infiltrates particularly in the right middle lobe. This may be the etiology of the patient's elevated white blood cell count. These are new from the prior exam from 09/14/2014.  Changes of anasarca.  12/25 Admitted with UGIB to ICU. 2 units PRBCs ordered Chest x-ray 09/14/2014, 09/17/2014, 09/19/2014, 09/21/2014 KUB 09/16/2014 2-D echo 09/21/2014  Antibiotics: Ceftriaxone 12/25 >> 12/26 Zosyn 12/26 >>12/28 Rocephin 12/28>>> 09/26/14 Zosyn 1/6---09/28/14  HPI/Subjective:  Sleepy from procedure but feels fair Dark stool yesterday, no overt blood Otherwise mildly tender in abd No other issue  Objective: Filed Vitals:   09/28/14 1354  BP: 137/64  Pulse:   Temp:   Resp: 13    Intake/Output Summary (Last 24 hours) at 09/28/14 1637 Last data filed at 09/28/14 1352  Gross per 24 hour  Intake 2149.83 ml  Output    675 ml  Net 1474.83 ml   Filed Weights   09/26/14 1500 09/27/14 0500 09/28/14 0441  Weight: 53.8 kg (118 lb 9.7 oz) 57.5 kg (126 lb 12.2 oz) 59.2 kg (130 lb 8.2 oz)    Exam:   General:  Cachectic, oriented in nad  Cardiovascular: RRR  Respiratory: Crackles in the R lower lobes  Abdomen: Soft, some diffuse tenderness to palpation anteriorly    Data Reviewed: Basic Metabolic Panel:  Recent Labs Lab 09/22/14 0500  09/25/14 0525 09/26/14 0434 09/27/14 0144 09/27/14 1215 09/28/14 0500  NA 136  < > 131* 133* 135 137 136  K 4.1  < > 3.7 3.4* 3.2* 3.8 3.6  CL 105  < > 106 105 117* 116* 111  CO2 26   < > 24 24 21 19 19   GLUCOSE 95  < > 75 85 63* 101* 65*  BUN 10  < > 6 7 7 6 6   CREATININE 0.51  < > 0.54 0.65 0.51 0.57 0.64  CALCIUM 7.5*  < > 7.2* 7.1* 6.7* 7.1* 7.2*  MG 1.8  --   --   --   --   --   --   < > = values in this interval not displayed. Liver Function Tests:  Recent Labs Lab 09/24/14 0815 09/27/14 0144 09/27/14 1215 09/28/14 0500  AST 26 20 25 24   ALT 16 13 14 14   ALKPHOS 156* 265* 240* 269*  BILITOT 0.8 0.4 0.7 0.6  PROT 6.9 5.3* 5.3* 5.4*  ALBUMIN 1.6* 1.2* 1.1* 1.1*   No results for input(s): LIPASE, AMYLASE in the last 168 hours. No results for input(s): AMMONIA in the last 168 hours. CBC:  Recent Labs Lab 09/26/14 1435 09/26/14 1945 09/27/14 0144 09/27/14 1215 09/28/14 0500  WBC 6.5 5.5 4.9 5.9 5.5  NEUTROABS 4.5 3.5 3.2 4.2  --   HGB 8.0* 7.6* 7.6* 9.9* 9.6*  HCT 25.0* 23.6* 23.5* 30.7* 29.1*  MCV 90.6 91.1 90.0 91.1 88.7  PLT 129* 127* 118* 139* 158   Cardiac Enzymes:  Recent Labs Lab 09/27/14 1215 09/27/14 1830 09/27/14 2151  TROPONINI 0.04* 0.03 0.03   BNP (last 3 results) No results for input(s): PROBNP in the last 8760 hours. CBG:  Recent Labs Lab 09/27/14 2308 09/27/14 2349 09/28/14 0315 09/28/14 0549 09/28/14 0626  GLUCAP 64* 85 68* 55* 119*    Recent Results (from the past 240 hour(s))  Culture, blood (routine x 2)     Status: None   Collection Time: 09/19/14  2:50 PM  Result Value Ref Range Status   Specimen Description BLOOD RIGHT ARM  Final   Special Requests BOTTLES DRAWN AEROBIC ONLY 6CC  Final   Culture   Final    NO GROWTH 5 DAYS Performed at [facility]    Report Status 09/25/2014 FINAL  Final  Culture, blood (routine x 2)     Status: None   Collection Time: 09/19/14  3:05 PM  Result Value Ref Range Status   Specimen Description BLOOD RIGHT ARM  Final   Special Requests BOTTLES DRAWN AEROBIC ONLY 4CC  Final   Culture   Final    NO GROWTH 5 DAYS Performed at [facility]    Report  Status 09/25/2014 FINAL  Final  Urine culture     Status: None   Collection Time: 09/20/14 10:45 AM  Result Value Ref Range Status   Specimen Description URINE, CATHETERIZED  Final   Special Requests NONE  Final   Colony Count NO GROWTH Performed at [facility]   Final   Culture NO GROWTH Performed at [facility]   Final   Report Status 09/23/2014 FINAL  Final  Culture, blood (routine x 2)     Status: None (Preliminary result)   Collection Time: 09/27/14 12:40 PM  Result Value Ref Range Status   Specimen Description BLOOD RIGHT HAND  Final   Special Requests BOTTLES DRAWN AEROBIC ONLY 1CC  Final   Culture   Final           BLOOD CULTURE RECEIVED NO GROWTH TO DATE CULTURE WILL BE  HELD FOR 5 DAYS BEFORE ISSUING A FINAL NEGATIVE REPORT Performed at [facility]    Report Status PENDING  Incomplete  Culture, blood (routine x 2)     Status: None (Preliminary result)   Collection Time: 09/27/14 12:45 PM  Result Value Ref Range Status   Specimen Description BLOOD EFT ARM  Final   Special Requests BOTTLES DRAWN AEROBIC AND ANAEROBIC 3CC  Final   Culture   Final           BLOOD CULTURE RECEIVED NO GROWTH TO DATE CULTURE WILL BE HELD FOR 5 DAYS BEFORE ISSUING A FINAL NEGATIVE REPORT Performed at [facility]    Report Status PENDING  Incomplete     Studies: Dg Chest 2 View  09/28/2014   CLINICAL DATA:  Follow-up pleural effusion.  Pancreatic cancer.  EXAM: CHEST  2 VIEW  COMPARISON:  09/22/2014.  FINDINGS: Trachea is midline. Right IJ Port-A-Cath tip is in the SVC. Trachea is midline. Heart size normal. Slight improvement in mixed interstitial and airspace disease bilaterally, without complete resolution. Small bilateral effusions.  IMPRESSION: Slight improvement in mixed interstitial and airspace disease, possibly due to edema, with small bilateral effusions.   Electronically Signed   By: Lorin Picket M.D.   On: 09/28/2014 16:04   Ct Abdomen Pelvis W  Contrast  09/26/2014   CLINICAL DATA:  Lower abdominal pain, bilateral flank pain, nausea, status post Whipple procedure 07/2014, recurent GI bleeding, anemia, suspected GI bleed  EXAM: CT ABDOMEN AND PELVIS WITH CONTRAST  TECHNIQUE: Multidetector CT imaging of the abdomen and pelvis was performed using the standard protocol following bolus administration of intravenous contrast.  CONTRAST:  44mL OMNIPAQUE IOHEXOL 300 MG/ML  SOLN  COMPARISON:  09/16/2014  FINDINGS: Sagittal images of the spine shows disc space flattening with vacuum disc phenomenon anterior spurring at L5-S1 level. Again noted disc bulge at L4-L5 and L5-S1 level.  Lung bases are unremarkable. Trace bilateral posterior pleural effusion with basilar atelectasis.  Again noted status post Whipple procedure. A pancreatic stent in place again noted. Again noted heterogeneous geographic pattern of enhancement of the liver without significant change from prior exam. Stable nonobstructive thrombosis in main portal vein see axial image 24.  Abdominal aorta is unremarkable. Bilateral renal artery appears patent. Again noted small amount of perihepatic and perisplenic ascites. No definite mesenteric abscess is noted. No aortic aneurysm. Atherosclerotic calcifications of distal abdominal aorta.  Contrast material noted within small bowel. There is no small bowel obstruction. Moderate stool noted in right colon and sigmoid colon. Normal appendix partially visualized in axial image 52. The uterus and adnexa are unremarkable. Mild distended urinary bladder. Small amount of pelvic free fluid noted posterior cul-de-sac. Again noted anasarca infiltration of subcutaneous fat abdominal and pelvic wall.  The spleen and adrenal glands are unremarkable. Kidneys are symmetrical in size and enhancement. No hydronephrosis or hydroureter. Delayed renal images shows bilateral renal symmetrical excretion. Small amount of free fluid noted in right paracolic gutter. No small bowel  or colonic obstruction.  IMPRESSION: 1. Again noted nonobstructive small amount of thrombus in main portal vein without change from prior exam. 2. Stable postsurgical changes post Whipple procedure. Again noted pancreatic duct stent in place. Stable heterogeneous pattern of enhancement of the liver 3. Again noted small amount of perihepatic and perisplenic ascites. Small amount of free fluid is noted in right paracolic gutter. Anasarca infiltration subcutaneous fat abdominal and pelvic wall again noted. 4. No small bowel or colonic obstruction. Moderate stool  noted in right colon and sigmoid colon. No pericecal inflammation. Normal appendix. 5. Mild distended urinary bladder. 6. No hydronephrosis or hydroureter. Bilateral renal symmetrical excretion.   Electronically Signed   By: Lahoma Crocker M.D.   On: 09/26/2014 17:04    Scheduled Meds: . feeding supplement (ENSURE COMPLETE)  237 mL Oral TID BM  . fentaNYL  25 mcg Transdermal Q72H  . lidocaine  1 patch Transdermal Q24H  . pantoprazole  40 mg Oral BID  . piperacillin-tazobactam (ZOSYN)  IV  3.375 g Intravenous 3 times per day  . pregabalin  25 mg Oral QHS  . psyllium  1 packet Oral Daily  . sertraline  100 mg Oral q morning - 10a  . sodium chloride  10-40 mL Intracatheter Q12H  . sucralfate  1 g Oral TID BM   Continuous Infusions: . dextrose 5% lactated ringers 50 mL/hr at 09/28/14 0600    Principal Problem:   Septic shock Active Problems:   Depression   Protein-calorie malnutrition, severe   Acute GI bleeding   Hemorrhagic shock   Thrombocytopenia   Portal vein thrombosis   Sepsis   Acute respiratory failure with hypoxia   Hypokalemia   Hypomagnesemia   Sepsis associated hypotension   Bacteremia due to Klebsiella pneumoniae   Anemia due to acute blood loss    Time spent: 35 mins    Verneita Griffes, MD [facility] (P) [phone]

## 2014-09-28 NOTE — Op Note (Signed)
[facility] [place], [address]   ENDOSCOPY PROCEDURE REPORT  PATIENT: Melinda Conrad, Melinda Conrad  MR#: [id number] BIRTHDATE: March 05, 1951 , 63  yrs. old GENDER: female ENDOSCOPIST: Ladene Artist, MD, [facility] REFERRED BY:  [facility] PROCEDURE DATE:  09/28/2014 PROCEDURE:  EGD, diagnostic ASA CLASS:     Class III INDICATIONS:  melena. MEDICATIONS: Fentanyl 50 mcg IV and Versed 5 mg IV TOPICAL ANESTHETIC: Cetacaine Spray DESCRIPTION OF PROCEDURE: After the risks benefits and alternatives of the procedure were thoroughly explained, informed consent was obtained.  The Pentax Gastroscope [id number] endoscope was introduced through the mouth and advanced to the second portion of the duodenum , Without limitations.  The instrument was slowly withdrawn as the mucosa was fully examined.    ESOPHAGUS: The mucosa of the esophagus appeared normal. STOMACH: A gastroenterostomy was found in the gastric body. The pylorus was closed. Findings of post Whipple procedure. Small areas at the anastomosis with visible sutures and friable mucosa.   The stomach otherwise appeared normal. JEJUNUM/DUODENUM: The exam showed no abnormalities in the examined portions of the afferent and efferent limbs. Retroflexed views revealed no abnormalities.   The scope was then withdrawn from the patient and the procedure completed.  COMPLICATIONS: There were no immediate complications.  ENDOSCOPIC IMPRESSION: 1.   Prior Whipple surgery with gastroenterostomy in the gastric body 2.   Fiable areas at the anastomosis 3.   The EGD otherwise appeared normal  RECOMMENDATIONS: 1.  Friable areas at anastomosis more likely to bleed with anticoagulation. Continue PPI and add Carafate 2.  Avoid ASA/NSAIDS 3.  Follow up with Dr. Carlean Purl as needed  eSigned:  Ladene Artist, MD, [facility] 09/28/2014 8:09 AM

## 2014-09-29 ENCOUNTER — Inpatient Hospital Stay (HOSPITAL_COMMUNITY): Payer: Medicaid Other

## 2014-09-29 LAB — CBC WITH DIFFERENTIAL/PLATELET
Basophils Absolute: 0.1 10*3/uL (ref 0.0–0.1)
Basophils Relative: 1 % (ref 0–1)
EOS ABS: 0.1 10*3/uL (ref 0.0–0.7)
Eosinophils Relative: 2 % (ref 0–5)
HEMATOCRIT: 26.9 % — AB (ref 36.0–46.0)
HEMOGLOBIN: 8.9 g/dL — AB (ref 12.0–15.0)
Lymphocytes Relative: 25 % (ref 12–46)
Lymphs Abs: 1.1 10*3/uL (ref 0.7–4.0)
MCH: 29.8 pg (ref 26.0–34.0)
MCHC: 33.1 g/dL (ref 30.0–36.0)
MCV: 90 fL (ref 78.0–100.0)
MONO ABS: 0.6 10*3/uL (ref 0.1–1.0)
Monocytes Relative: 13 % — ABNORMAL HIGH (ref 3–12)
Neutro Abs: 2.6 10*3/uL (ref 1.7–7.7)
Neutrophils Relative %: 59 % (ref 43–77)
Platelets: 148 10*3/uL — ABNORMAL LOW (ref 150–400)
RBC: 2.99 MIL/uL — ABNORMAL LOW (ref 3.87–5.11)
RDW: 16 % — ABNORMAL HIGH (ref 11.5–15.5)
WBC: 4.4 10*3/uL (ref 4.0–10.5)

## 2014-09-29 LAB — RENAL FUNCTION PANEL
Albumin: <1 g/dL — ABNORMAL LOW (ref 3.5–5.2)
Anion gap: 3 — ABNORMAL LOW (ref 5–15)
BUN: 6 mg/dL (ref 6–23)
CHLORIDE: 113 meq/L — AB (ref 96–112)
CO2: 20 mmol/L (ref 19–32)
CREATININE: 0.57 mg/dL (ref 0.50–1.10)
Calcium: 7 mg/dL — ABNORMAL LOW (ref 8.4–10.5)
GFR calc Af Amer: >90 mL/min (ref >90)
Glucose, Bld: 64 mg/dL — ABNORMAL LOW (ref 70–99)
PHOSPHORUS: 3.2 mg/dL (ref 2.3–4.6)
Potassium: 3.4 mmol/L — ABNORMAL LOW (ref 3.5–5.1)
Sodium: 136 mmol/L (ref 135–145)

## 2014-09-29 LAB — GLUCOSE, CAPILLARY
GLUCOSE-CAPILLARY: 48 mg/dL — AB (ref 70–99)
GLUCOSE-CAPILLARY: 125 mg/dL — AB (ref 70–99)
GLUCOSE-CAPILLARY: 93 mg/dL (ref 70–99)
Glucose-Capillary: 132 mg/dL — ABNORMAL HIGH (ref 70–99)
Glucose-Capillary: 124 mg/dL — ABNORMAL HIGH (ref 70–99)
Glucose-Capillary: 76 mg/dL (ref 70–99)
Glucose-Capillary: 95 mg/dL (ref 70–99)
Glucose-Capillary: 114 mg/dL — ABNORMAL HIGH (ref 70–99)

## 2014-09-29 LAB — PROCALCITONIN: Procalcitonin: 0.12 ng/mL

## 2014-09-29 MED ORDER — SODIUM CHLORIDE 0.9 % IJ SOLN
10.0000 mL | Freq: Two times a day (BID) | INTRAMUSCULAR | Status: DC
  Administered 2014-09-30: 10 mL

## 2014-09-29 MED ORDER — ALUM & MAG HYDROXIDE-SIMETH 200-200-20 MG/5ML PO SUSP
15.0000 mL | Freq: Four times a day (QID) | ORAL | Status: DC | PRN
  Administered 2014-09-29 – 2014-09-30 (×2): 15 mL via ORAL
  Filled 2014-09-29 (×2): qty 30

## 2014-09-29 MED ORDER — DEXTROSE 50 % IV SOLN
INTRAVENOUS | Status: AC
  Administered 2014-09-29: 50 mL
  Filled 2014-09-29: qty 50

## 2014-09-29 MED ORDER — SODIUM CHLORIDE 0.9 % IJ SOLN
10.0000 mL | Freq: Two times a day (BID) | INTRAMUSCULAR | Status: DC
  Administered 2014-09-30 – 2014-10-09 (×12): 10 mL

## 2014-09-29 MED ORDER — SODIUM CHLORIDE 0.9 % IJ SOLN: 10.0000 mL | INTRAMUSCULAR | Status: DC | PRN

## 2014-09-29 MED ORDER — SODIUM CHLORIDE 0.9 % IJ SOLN
10.0000 mL | INTRAMUSCULAR | Status: DC | PRN
  Administered 2014-10-05 – 2014-10-09 (×4): 10 mL
  Filled 2014-09-29 (×4): qty 40

## 2014-09-29 MED ORDER — POTASSIUM CHLORIDE CRYS ER 20 MEQ PO TBCR
40.0000 meq | EXTENDED_RELEASE_TABLET | Freq: Every day | ORAL | Status: DC
  Administered 2014-09-29 – 2014-10-03 (×5): 40 meq via ORAL
  Filled 2014-09-29 (×5): qty 2

## 2014-09-29 NOTE — Progress Notes (Signed)
1 Day Post-Op  Subjective: No complaints. Feels pretty good. Low blood sugar this am  Objective: Vital signs in last 24 hours: Temp:  [97.9 F (36.6 C)-98.7 F (37.1 C)] 98.6 F (37 C) (01/09 0443) Pulse Rate:  [44-52] 44 (01/08 0925) Resp:  [11-19] 12 (01/09 0443) BP: (80-149)/(37-65) 106/50 mmHg (01/09 0443) SpO2:  [98 %-100 %] 98 % (01/08 2215) Weight:  [130 lb 8.2 oz (59.2 kg)-133 lb 2.5 oz (60.4 kg)] 133 lb 2.5 oz (60.4 kg) (01/09 0443) Last BM Date: 09/27/14  Intake/Output from previous day: 01/08 0701 - 01/09 0700 In: 1407.3 [P.O.:100; I.V.:1307.3] Out: 925 [Urine:925] Intake/Output this shift:    Resp: clear to auscultation bilaterally Cardio: regular rate and rhythm GI: soft, nontender  Lab Results:   Recent Labs  09/28/14 0500 09/29/14 0431  WBC 5.5 4.4  HGB 9.6* 8.9*  HCT 29.1* 26.9*  PLT 158 148*   BMET  Recent Labs  09/28/14 0500 09/29/14 0431  NA 136 136  K 3.6 3.4*  CL 111 113*  CO2 19 20  GLUCOSE 65* 64*  BUN 6 6  CREATININE 0.64 0.57  CALCIUM 7.2* 7.0*   PT/INR  Recent Labs  09/27/14 0144  LABPROT 17.0*  INR 1.37   ABG No results for input(s): PHART, HCO3 in the last 72 hours.  Invalid input(s): PCO2, PO2  Studies/Results: Dg Chest 2 View  09/28/2014   CLINICAL DATA:  Follow-up pleural effusion.  Pancreatic cancer.  EXAM: CHEST  2 VIEW  COMPARISON:  09/22/2014.  FINDINGS: Trachea is midline. Right IJ Port-A-Cath tip is in the SVC. Trachea is midline. Heart size normal. Slight improvement in mixed interstitial and airspace disease bilaterally, without complete resolution. Small bilateral effusions.  IMPRESSION: Slight improvement in mixed interstitial and airspace disease, possibly due to edema, with small bilateral effusions.   Electronically Signed   By: Lorin Picket M.D.   On: 09/28/2014 16:04    Anti-infectives: Anti-infectives    Start     Dose/Rate Route Frequency Ordered Stop   09/26/14 1400  piperacillin-tazobactam  (ZOSYN) IVPB 3.375 g  Status:  Discontinued     3.375 g12.5 mL/hr over 240 Minutes Intravenous 3 times per day 09/26/14 1342 09/28/14 1649   09/17/14 1200  cefTRIAXone (ROCEPHIN) 2 g in dextrose 5 % 50 mL IVPB - Premix  Status:  Discontinued     2 g100 mL/hr over 30 Minutes Intravenous Every 24 hours 09/17/14 1013 09/26/14 1315   09/15/14 1800  piperacillin-tazobactam (ZOSYN) IVPB 3.375 g  Status:  Discontinued     3.375 g12.5 mL/hr over 240 Minutes Intravenous Every 8 hours 09/15/14 1741 09/17/14 1013   09/14/14 2245  cefTRIAXone (ROCEPHIN) 1 g in dextrose 5 % 50 mL IVPB  Status:  Discontinued     1 g100 mL/hr over 30 Minutes Intravenous Every 24 hours 09/14/14 2238 09/15/14 1730      Assessment/Plan: s/p Procedure(s): ESOPHAGOGASTRODUODENOSCOPY (EGD) (N/A) Advance diet. Start soft diet Portal vein thrombosis. Off anticoagulation. Hg relatively stable  LOS: 15 days    TOTH III,PAUL S 09/29/2014

## 2014-09-29 NOTE — Progress Notes (Signed)
Peripherally Inserted Central Catheter/Midline Placement  The IV Nurse has discussed with the patient and/or persons authorized to consent for the patient, the purpose of this procedure and the potential benefits and risks involved with this procedure.  The benefits include less needle sticks, lab draws from the catheter and patient may be discharged home with the catheter.  Risks include, but not limited to, infection, bleeding, blood clot (thrombus formation), and puncture of an artery; nerve damage and irregular heat beat.  Alternatives to this procedure were also discussed.  PICC/Midline Placement Documentation  PICC / Midline Double Lumen 09/29/14 PICC Right Brachial 31 cm 0 cm (Active)  Indication for Insertion or Continuance of Line Prolonged intravenous therapies 09/29/2014  4:41 PM  Exposed Catheter (cm) 0 cm 09/29/2014  4:41 PM  Site Assessment Clean;Dry;Intact 09/29/2014  4:41 PM  Lumen #1 Status Flushed;Saline locked;Blood return noted 09/29/2014  4:41 PM  Lumen #2 Status Flushed;Saline locked;Blood return noted 09/29/2014  4:41 PM  Dressing Change Due 10/06/14 09/29/2014  4:41 PM       Gordan Payment 09/29/2014, 4:43 PM

## 2014-09-29 NOTE — Progress Notes (Signed)
[facility] PROGRESS NOTE  Melinda Conrad YQM:[id number] DOB: [birth year] DOA: 09/14/2014 PCP: Kevan Ny, MD  Brief History  64 y/o female with history of pancreatitis, hypertension, pancreatic cancer with prior Whipple surgery (08/07/14) with preoperative radiation and chemotherapy. Admit to ICU12/25/2015 hemoglobin was 5.8. with several days of melanotic stools, fever up to 104.26F, and hematemesis.    12/25 CT abd/pelvis showed two ovoid high-density lesions adjacent to the pancreatic stent within the jejunum could represent hemorrhage or hematoma. No additional gross evidence of surgical complication. Gastroenterology was consulted.the patient was placed on an IV Protonix drip.  ?GI bleed was due to chronic NSAID use vs ischemia induced from her hypotension.  EGD was deferred because of the patient's persistent hypotension.  Blood cultures performed at the time of admission revealed Klebsiella pneumoniae.  The patient was initially started on Zosyn, but this was narrowed to ceftriaxone.   CT abd/p for abd pain 09/16/2014 w=new portal vein thrombus, bilateral pleural effusions with bibasilar infiltrates .  Gen. surgery was consulted. After careful consideration amongst all consultants due to the patient's recent GI bleed, the patient was started on a heparin drip.   The patient continued to have progressive thrombocytopenia. Heme/Onc, Dr. Benay Spice was consulted. The patient's heparin drip was subsequently held with the hopes that with her thrombocytopenia will improved with treatment for sepsis. Thrombocytopenia improving.   Unfortunately hemoglobin dropped 9-->6.7, patient complained of left flank pain not relieved by IV pain medications Persistently hypotensive over the course of 1/5-1/6 therefore transferred back to step down unit. Noted to be on PRN Metoprolol as well as amlodipine 5 mg Heparin completely discontinued because of new onset anemia  #1 severe sepsis Patient  was admitted with sepsis felt to be secondary to her bacteremia. Patient improving clinically. Afebrile. Patient off pressors. Blood cultures with Klebsiella pneumonia likely seeded from the GI tract.  Was on IV Rocephin-->Zosyn as below CXR showed persisting PNA Await BC x 2 09/27/14  As PCt is 0.2, d/c IV abx 1/8  #2 Klebsiella pneumonia bacteremia Likely seeded from the GI source? 12/30 surveillance blood cultures in the setting of portal vein thromboses negative X5 days. CT scan abdomen pelvis 12/27 = bibasilar infiltrates right middle lobe-new from chest x-ray 12/25 Transition antibiotics from Rocephin->Zosyn 09/26/14 Follow BC x 2 1/7 [ Pending]  #3 hemorrhagic shock/acute blood loss anemia + s/p Whipple T3N1  Likely secondary to chronic NSAID use. Patient with no further bleeding today. Patient status post 4 units packed red blood cells. Hemoglobin currently stable. Follow.Continue PPI BID. GI was following and recommend monitoring at this point in time. Unfortunately blood count dropped to 6.7 so we are getting a CT abdomen pelvis with contrast 1/6 Transfused 1 unit packed red blood cells 1/6 Transfused  1 U PRBC again 09/27/14 Saline locked 2/2 to resolution hypotension 1/8 Endoscopy 1/8=Friable anastamosis with bleeding Advanced to soft diet by Gen surgery 1/9  #4 portal vein thrombosis Anticoagulation held secondary to significant thrombocytopenia. Platelet count improving. Patient has been started empirically on IV heparin per general surgery as platelet counts have improved.  Given events #3, will need to discuss with both general surgery/oncology further plan going forward For now heparin/Lovenox have been completely held  #5 acute respiratory failure Concern for TRALI secondary to transfusions versus acute lung injury from bacteremia.  09/27/14 with crackles RLL Patients with some shortness of breath however states improving.  Patient +4L since admission.  Hold 40 mg of IV Lasix  on 12/29, 12/30, 12/31.   #  6 thrombocytopenia Likely secondary to sepsis with possible DIC and consumption. Platelet function showing recovery and improving. No schistocytes on peripheral smear. Patient may have had low-grade DIC in the setting of sepsis ? Monitor Obtain hemolysis labs if no improvement in cbc's from this pm  #7 acute kidney injury Secondary to sepsis and pre-azotemia secondary to hypotension. Improved and currently at baseline. Monitor with diuresis. Resolved  #8 severe protein calorie malnutrition Patient has been seen by dietitian. Continue current supplements. Diet has been advanced. Follow.  #9 pancreatic adenocarcinoma s/p Whipple Status post Whipple's with preoperative XRT and chemotherapy. Patient has been seen by Dr. Benay Spice of oncology and chemotherapy on hold for now. Outpatient follow-up with oncology.  #10 hypokalemia/hypomagnesemia Likely secondary to diuretics. Repleted.   #11 deconditioning/debility Patient has been seen by physical therapy recommended home health PT.  #12 prophylaxis Protonix for GI prophylaxis. SCDs for DVT prophylaxis.    Code Status: Full Family Communication: Updated patient and family present. Disposition Plan: Transfer back to stepdown unit overnight pending resolution of the events If no improvement would reconsult palliative care   Consultants:  Palliative care Dr. Hilma Favors 09/19/2014  Gastroenterology Dr. Deatra Ina 09/15/2014  Hematology/oncology Dr. Benay Spice: 09/17/2014  GI reconsulted 09/26/14 Dr. Fuller Plan  Procedures: R IJ CVL 12/25 >>  12/25 CT abd/pelvis: Two ovoid high-density lesions adjacent to the pancreatic stent within the jejunum could represent hemorrhage or hematoma. No additional gross evidence of surgical complication on this non IV and oral contrast exam. Potential small additional clot within the stomach is indeterminate 09/16/2014 CT abdomen and pelvis:Changes consistent with prior Whipple procedure  with pancreatic stent in place. Some postoperative free fluid is noted although no organized collection to suggest abscess is noted at this time.  New main portal vein thrombus which is nonocclusive but occupies a majority of the lumen of the main portal vein. There is some differential enhancement of the liver identified which in part is likely related to this thrombus and timing of the contrast bolus.  New bilateral pleural effusions with bibasilar infiltrates particularly in the right middle lobe. This may be the etiology of the patient's elevated white blood cell count. These are new from the prior exam from 09/14/2014.  Changes of anasarca.  12/25 Admitted with UGIB to ICU. 2 units PRBCs ordered Chest x-ray 09/14/2014, 09/17/2014, 09/19/2014, 09/21/2014 KUB 09/16/2014 2-D echo 09/21/2014  Antibiotics: Ceftriaxone 12/25 >> 12/26 Zosyn 12/26 >>12/28 Rocephin 12/28>>> 09/26/14 Zosyn 1/6---09/28/14  HPI/Subjective:  Sleepy from procedure but feels fair Dark stool yesterday, no overt blood Otherwise mildly tender in abd No other issue  Objective: Filed Vitals:   09/29/14 1800  BP: 104/75  Pulse:   Temp:   Resp: 17    Intake/Output Summary (Last 24 hours) at 09/29/14 1848 Last data filed at 09/29/14 1800  Gross per 24 hour  Intake   2410 ml  Output    775 ml  Net   1635 ml   Filed Weights   09/28/14 0441 09/29/14 0433 09/29/14 0443  Weight: 59.2 kg (130 lb 8.2 oz) 59.2 kg (130 lb 8.2 oz) 60.4 kg (133 lb 2.5 oz)    Exam:   General:  Cachectic, oriented in nad  Cardiovascular: RRR  Respiratory: Crackles in the R lower lobes  Abdomen: Soft, some diffuse tenderness to palpation anteriorly    Data Reviewed: Basic Metabolic Panel:  Recent Labs Lab 09/26/14 0434 09/27/14 0144 09/27/14 1215 09/28/14 0500 09/29/14 0431  NA 133* 135 137 136 136  K 3.4*  3.2* 3.8 3.6 3.4*  CL 105 117* 116* 111 113*  CO2 24 21 19 19 20   GLUCOSE 85 63* 101* 65* 64*   BUN 7 7 6 6 6   CREATININE 0.65 0.51 0.57 0.64 0.57  CALCIUM 7.1* 6.7* 7.1* 7.2* 7.0*  PHOS  --   --   --   --  3.2   Liver Function Tests:  Recent Labs Lab 09/24/14 0815 09/27/14 0144 09/27/14 1215 09/28/14 0500 09/29/14 0431  AST 26 20 25 24   --   ALT 16 13 14 14   --   ALKPHOS 156* 265* 240* 269*  --   BILITOT 0.8 0.4 0.7 0.6  --   PROT 6.9 5.3* 5.3* 5.4*  --   ALBUMIN 1.6* 1.2* 1.1* 1.1* <1.0*   No results for input(s): LIPASE, AMYLASE in the last 168 hours. No results for input(s): AMMONIA in the last 168 hours. CBC:  Recent Labs Lab 09/26/14 1435 09/26/14 1945 09/27/14 0144 09/27/14 1215 09/28/14 0500 09/29/14 0431  WBC 6.5 5.5 4.9 5.9 5.5 4.4  NEUTROABS 4.5 3.5 3.2 4.2  --  2.6  HGB 8.0* 7.6* 7.6* 9.9* 9.6* 8.9*  HCT 25.0* 23.6* 23.5* 30.7* 29.1* 26.9*  MCV 90.6 91.1 90.0 91.1 88.7 90.0  PLT 129* 127* 118* 139* 158 148*   Cardiac Enzymes:  Recent Labs Lab 09/27/14 1215 09/27/14 1830 09/27/14 2151  TROPONINI 0.04* 0.03 0.03   BNP (last 3 results) No results for input(s): PROBNP in the last 8760 hours. CBG:  Recent Labs Lab 09/29/14 0919 09/29/14 1030 09/29/14 1129 09/29/14 1529 09/29/14 1654  GLUCAP 124* 125* 93 76 95    Recent Results (from the past 240 hour(s))  Urine culture     Status: None   Collection Time: 09/20/14 10:45 AM  Result Value Ref Range Status   Specimen Description URINE, CATHETERIZED  Final   Special Requests NONE  Final   Colony Count NO GROWTH Performed at [facility]   Final   Culture NO GROWTH Performed at [facility]   Final   Report Status 09/23/2014 FINAL  Final  Culture, blood (routine x 2)     Status: None (Preliminary result)   Collection Time: 09/27/14 12:40 PM  Result Value Ref Range Status   Specimen Description BLOOD RIGHT HAND  Final   Special Requests BOTTLES DRAWN AEROBIC ONLY 1CC  Final   Culture   Final           BLOOD CULTURE RECEIVED NO GROWTH TO DATE CULTURE WILL BE HELD  FOR 5 DAYS BEFORE ISSUING A FINAL NEGATIVE REPORT Performed at [facility]    Report Status PENDING  Incomplete  Culture, blood (routine x 2)     Status: None (Preliminary result)   Collection Time: 09/27/14 12:45 PM  Result Value Ref Range Status   Specimen Description BLOOD EFT ARM  Final   Special Requests BOTTLES DRAWN AEROBIC AND ANAEROBIC 3CC  Final   Culture   Final           BLOOD CULTURE RECEIVED NO GROWTH TO DATE CULTURE WILL BE HELD FOR 5 DAYS BEFORE ISSUING A FINAL NEGATIVE REPORT Performed at [facility]    Report Status PENDING  Incomplete     Studies: Dg Chest 2 View  09/28/2014   CLINICAL DATA:  Follow-up pleural effusion.  Pancreatic cancer.  EXAM: CHEST  2 VIEW  COMPARISON:  09/22/2014.  FINDINGS: Trachea is midline. Right IJ Port-A-Cath tip is in the  SVC. Trachea is midline. Heart size normal. Slight improvement in mixed interstitial and airspace disease bilaterally, without complete resolution. Small bilateral effusions.  IMPRESSION: Slight improvement in mixed interstitial and airspace disease, possibly due to edema, with small bilateral effusions.   Electronically Signed   By: Lorin Picket M.D.   On: 09/28/2014 16:04   Dg Chest Port 1 View  09/29/2014   CLINICAL DATA:  PICC line placement  EXAM: PORTABLE CHEST - 1 VIEW  COMPARISON:  Radiograph 09/28/2014  FINDINGS: Interval placement of a right PICC line with tip in the distal SVC. Right IJ catheter remains with tip in the distal SVC. Normal cardiac silhouette. There is fine interstitial pattern consistent with pulmonary interstitial edema. No pneumothorax. No focal consolidation.  IMPRESSION: 1. Right PICC line in good position. 2. Interstitial edema pattern.   Electronically Signed   By: Suzy Bouchard M.D.   On: 09/29/2014 17:27    Scheduled Meds: . feeding supplement (ENSURE COMPLETE)  237 mL Oral TID BM  . fentaNYL  25 mcg Transdermal Q72H  . lidocaine  1 patch Transdermal Q24H  .  pantoprazole  40 mg Oral BID  . potassium chloride  40 mEq Oral Daily  . pregabalin  25 mg Oral QHS  . psyllium  1 packet Oral Daily  . sertraline  100 mg Oral q morning - 10a  . sodium chloride  10-40 mL Intracatheter Q12H  . sodium chloride  10-40 mL Intracatheter Q12H  . sodium chloride  10-40 mL Intracatheter Q12H  . sucralfate  1 g Oral TID BM   Continuous Infusions: . dextrose 5% lactated ringers 1,000 mL (09/29/14 1751)    Principal Problem:   Septic shock Active Problems:   Depression   Protein-calorie malnutrition, severe   Acute GI bleeding   Hemorrhagic shock   Thrombocytopenia   Portal vein thrombosis   Sepsis   Acute respiratory failure with hypoxia   Hypokalemia   Hypomagnesemia   Sepsis associated hypotension   Bacteremia due to Klebsiella pneumoniae   Anemia due to acute blood loss    Time spent: 35 mins    Verneita Griffes, MD [facility] (P) [phone]

## 2014-09-30 LAB — PROTIME-INR
INR: 1.51 — ABNORMAL HIGH (ref 0.00–1.49)
Prothrombin Time: 18.3 seconds — ABNORMAL HIGH (ref 11.6–15.2)

## 2014-09-30 LAB — CBC WITH DIFFERENTIAL/PLATELET
BASOS PCT: 0 % (ref 0–1)
Basophils Absolute: 0 10*3/uL (ref 0.0–0.1)
Basophils Absolute: 0 10*3/uL (ref 0.0–0.1)
Basophils Relative: 1 % (ref 0–1)
EOS PCT: 0 % (ref 0–5)
Eosinophils Absolute: 0.1 10*3/uL (ref 0.0–0.7)
Eosinophils Absolute: 0 10*3/uL (ref 0.0–0.7)
Eosinophils Relative: 1 % (ref 0–5)
HCT: 26.5 % — ABNORMAL LOW (ref 36.0–46.0)
HCT: 23.6 % — ABNORMAL LOW (ref 36.0–46.0)
Hemoglobin: 8.8 g/dL — ABNORMAL LOW (ref 12.0–15.0)
Hemoglobin: 7.7 g/dL — ABNORMAL LOW (ref 12.0–15.0)
LYMPHS PCT: 20 % (ref 12–46)
Lymphocytes Relative: 12 % (ref 12–46)
Lymphs Abs: 1 10*3/uL (ref 0.7–4.0)
Lymphs Abs: 1 10*3/uL (ref 0.7–4.0)
MCH: 29.8 pg (ref 26.0–34.0)
MCH: 29.4 pg (ref 26.0–34.0)
MCHC: 33.2 g/dL (ref 30.0–36.0)
MCHC: 32.6 g/dL (ref 30.0–36.0)
MCV: 89.8 fL (ref 78.0–100.0)
MCV: 90.1 fL (ref 78.0–100.0)
Monocytes Absolute: 0.6 10*3/uL (ref 0.1–1.0)
Monocytes Absolute: 0.6 10*3/uL (ref 0.1–1.0)
Monocytes Relative: 12 % (ref 3–12)
Monocytes Relative: 7 % (ref 3–12)
NEUTROS ABS: 7.3 10*3/uL (ref 1.7–7.7)
NEUTROS PCT: 66 % (ref 43–77)
Neutro Abs: 3.3 10*3/uL (ref 1.7–7.7)
Neutrophils Relative %: 81 % — ABNORMAL HIGH (ref 43–77)
Platelets: 134 10*3/uL — ABNORMAL LOW (ref 150–400)
Platelets: 126 10*3/uL — ABNORMAL LOW (ref 150–400)
RBC: 2.95 MIL/uL — ABNORMAL LOW (ref 3.87–5.11)
RBC: 2.62 MIL/uL — AB (ref 3.87–5.11)
RDW: 16.1 % — ABNORMAL HIGH (ref 11.5–15.5)
RDW: 16.2 % — AB (ref 11.5–15.5)
WBC: 5 10*3/uL (ref 4.0–10.5)
WBC: 9 10*3/uL (ref 4.0–10.5)

## 2014-09-30 LAB — TYPE AND SCREEN
ABO/RH(D): O POS
ANTIBODY SCREEN: NEGATIVE
UNIT DIVISION: 0
UNIT DIVISION: 0
UNIT DIVISION: 0
Unit division: 0

## 2014-09-30 LAB — CBC
HCT: 26.6 % — ABNORMAL LOW (ref 36.0–46.0)
Hemoglobin: 8.8 g/dL — ABNORMAL LOW (ref 12.0–15.0)
MCH: 29.7 pg (ref 26.0–34.0)
MCHC: 33.1 g/dL (ref 30.0–36.0)
MCV: 89.9 fL (ref 78.0–100.0)
Platelets: 123 10*3/uL — ABNORMAL LOW (ref 150–400)
RBC: 2.96 MIL/uL — ABNORMAL LOW (ref 3.87–5.11)
RDW: 16.1 % — ABNORMAL HIGH (ref 11.5–15.5)
WBC: 6 10*3/uL (ref 4.0–10.5)

## 2014-09-30 LAB — GLUCOSE, CAPILLARY
GLUCOSE-CAPILLARY: 97 mg/dL (ref 70–99)
GLUCOSE-CAPILLARY: 98 mg/dL (ref 70–99)
GLUCOSE-CAPILLARY: 108 mg/dL — AB (ref 70–99)
GLUCOSE-CAPILLARY: 119 mg/dL — AB (ref 70–99)
Glucose-Capillary: 101 mg/dL — ABNORMAL HIGH (ref 70–99)
Glucose-Capillary: 68 mg/dL — ABNORMAL LOW (ref 70–99)
Glucose-Capillary: 71 mg/dL (ref 70–99)
Glucose-Capillary: 136 mg/dL — ABNORMAL HIGH (ref 70–99)
Glucose-Capillary: 78 mg/dL (ref 70–99)
Glucose-Capillary: 79 mg/dL (ref 70–99)

## 2014-09-30 LAB — APTT: aPTT: 41 seconds — ABNORMAL HIGH (ref 24–37)

## 2014-09-30 LAB — PREPARE RBC (CROSSMATCH)

## 2014-09-30 MED ORDER — SODIUM CHLORIDE 0.9 % IV SOLN
80.0000 mg | Freq: Once | INTRAVENOUS | Status: AC
  Administered 2014-09-30: 80 mg via INTRAVENOUS
  Filled 2014-09-30: qty 80

## 2014-09-30 MED ORDER — PANTOPRAZOLE SODIUM 40 MG IV SOLR
40.0000 mg | Freq: Two times a day (BID) | INTRAVENOUS | Status: DC
  Filled 2014-09-30: qty 40

## 2014-09-30 MED ORDER — MORPHINE SULFATE 2 MG/ML IJ SOLN
1.0000 mg | INTRAMUSCULAR | Status: DC | PRN
  Administered 2014-09-30 – 2014-10-02 (×6): 2 mg via INTRAVENOUS
  Filled 2014-09-30 (×6): qty 1

## 2014-09-30 MED ORDER — SODIUM CHLORIDE 0.9 % IV SOLN
50.0000 ug/h | INTRAVENOUS | Status: DC
  Administered 2014-09-30 – 2014-10-02 (×6): 50 ug/h via INTRAVENOUS
  Filled 2014-09-30 (×13): qty 1

## 2014-09-30 MED ORDER — DEXTROSE 5 % IV SOLN
INTRAVENOUS | Status: DC
  Administered 2014-09-30: 1000 mL via INTRAVENOUS

## 2014-09-30 MED ORDER — SODIUM CHLORIDE 0.9 % IV SOLN
Freq: Once | INTRAVENOUS | Status: AC
  Administered 2014-09-30: 250 mL via INTRAVENOUS

## 2014-09-30 MED ORDER — SODIUM CHLORIDE 0.9 % IV SOLN
8.0000 mg/h | INTRAVENOUS | Status: DC
  Administered 2014-09-30 – 2014-10-02 (×5): 8 mg/h via INTRAVENOUS
  Filled 2014-09-30 (×13): qty 80

## 2014-09-30 MED ORDER — DEXTROSE-NACL 5-0.9 % IV SOLN
INTRAVENOUS | Status: AC
  Administered 2014-09-30 – 2014-10-01 (×2): via INTRAVENOUS
  Filled 2014-09-30 (×3): qty 1000

## 2014-09-30 MED ORDER — PANTOPRAZOLE SODIUM 40 MG IV SOLR
40.0000 mg | Freq: Two times a day (BID) | INTRAVENOUS | Status: DC
  Administered 2014-09-30: 40 mg via INTRAVENOUS
  Filled 2014-09-30: qty 40

## 2014-09-30 MED ORDER — SODIUM CHLORIDE 0.9 % IV SOLN
Freq: Once | INTRAVENOUS | Status: AC
  Administered 2014-09-30: 500 mL via INTRAVENOUS

## 2014-09-30 MED ORDER — SODIUM CHLORIDE 0.9 % IV SOLN
INTRAVENOUS | Status: DC
  Administered 2014-09-30: 500 mL via INTRAVENOUS

## 2014-09-30 MED ORDER — ENSURE PUDDING PO PUDG
1.0000 | Freq: Three times a day (TID) | ORAL | Status: DC
  Administered 2014-09-30 – 2014-10-05 (×6): 1 via ORAL
  Filled 2014-09-30 (×27): qty 1

## 2014-09-30 MED ORDER — PANTOPRAZOLE SODIUM 40 MG IV SOLR: 40.0000 mg | Freq: Once | INTRAVENOUS | Status: DC

## 2014-09-30 MED ORDER — FUROSEMIDE 10 MG/ML IJ SOLN
20.0000 mg | Freq: Once | INTRAMUSCULAR | Status: DC
Start: 2014-09-30 — End: 2014-10-01

## 2014-09-30 MED ORDER — OCTREOTIDE LOAD VIA INFUSION
25.0000 ug | Freq: Once | INTRAVENOUS | Status: AC
  Administered 2014-09-30: 25 ug via INTRAVENOUS
  Filled 2014-09-30: qty 13

## 2014-09-30 MED ORDER — DIPHENHYDRAMINE HCL 25 MG PO CAPS: 25.0000 mg | ORAL_CAPSULE | Freq: Once | ORAL | Status: DC

## 2014-09-30 NOTE — Progress Notes (Signed)
2 Days Post-Op  Subjective: No complaints. Hg stable. Blood sugar has been low  Objective: Vital signs in last 24 hours: Temp:  [98.3 F (36.8 C)-98.7 F (37.1 C)] 98.4 F (36.9 C) (01/10 0400) Pulse Rate:  [47] 47 (01/09 1100) Resp:  [8-24] 13 (01/10 0600) BP: (90-127)/(40-82) 93/40 mmHg (01/10 0600) SpO2:  [97 %-100 %] 100 % (01/09 2000) Weight:  [133 lb 2.5 oz (60.4 kg)] 133 lb 2.5 oz (60.4 kg) (01/10 0500) Last BM Date: 09/29/14  Intake/Output from previous day: 01/09 0701 - 01/10 0700 In: 2080 [P.O.:930; I.V.:1150] Out: 475 [Urine:475] Intake/Output this shift:    Resp: clear to auscultation bilaterally Cardio: regular rate and rhythm GI: soft, nontender  Lab Results:   Recent Labs  09/29/14 0431 09/30/14 0515  WBC 4.4 5.0  HGB 8.9* 8.8*  HCT 26.9* 26.5*  PLT 148* 134*   BMET  Recent Labs  09/28/14 0500 09/29/14 0431  NA 136 136  K 3.6 3.4*  CL 111 113*  CO2 19 20  GLUCOSE 65* 64*  BUN 6 6  CREATININE 0.64 0.57  CALCIUM 7.2* 7.0*   PT/INR No results for input(s): LABPROT, INR in the last 72 hours. ABG No results for input(s): PHART, HCO3 in the last 72 hours.  Invalid input(s): PCO2, PO2  Studies/Results: Dg Chest 2 View  09/28/2014   CLINICAL DATA:  Follow-up pleural effusion.  Pancreatic cancer.  EXAM: CHEST  2 VIEW  COMPARISON:  09/22/2014.  FINDINGS: Trachea is midline. Right IJ Port-A-Cath tip is in the SVC. Trachea is midline. Heart size normal. Slight improvement in mixed interstitial and airspace disease bilaterally, without complete resolution. Small bilateral effusions.  IMPRESSION: Slight improvement in mixed interstitial and airspace disease, possibly due to edema, with small bilateral effusions.   Electronically Signed   By: Lorin Picket M.D.   On: 09/28/2014 16:04   Dg Chest Port 1 View  09/29/2014   CLINICAL DATA:  PICC line placement  EXAM: PORTABLE CHEST - 1 VIEW  COMPARISON:  Radiograph 09/28/2014  FINDINGS: Interval placement  of a right PICC line with tip in the distal SVC. Right IJ catheter remains with tip in the distal SVC. Normal cardiac silhouette. There is fine interstitial pattern consistent with pulmonary interstitial edema. No pneumothorax. No focal consolidation.  IMPRESSION: 1. Right PICC line in good position. 2. Interstitial edema pattern.   Electronically Signed   By: Suzy Bouchard M.D.   On: 09/29/2014 17:27    Anti-infectives: Anti-infectives    Start     Dose/Rate Route Frequency Ordered Stop   09/26/14 1400  piperacillin-tazobactam (ZOSYN) IVPB 3.375 g  Status:  Discontinued     3.375 g12.5 mL/hr over 240 Minutes Intravenous 3 times per day 09/26/14 1342 09/28/14 1649   09/17/14 1200  cefTRIAXone (ROCEPHIN) 2 g in dextrose 5 % 50 mL IVPB - Premix  Status:  Discontinued     2 g100 mL/hr over 30 Minutes Intravenous Every 24 hours 09/17/14 1013 09/26/14 1315   09/15/14 1800  piperacillin-tazobactam (ZOSYN) IVPB 3.375 g  Status:  Discontinued     3.375 g12.5 mL/hr over 240 Minutes Intravenous Every 8 hours 09/15/14 1741 09/17/14 1013   09/14/14 2245  cefTRIAXone (ROCEPHIN) 1 g in dextrose 5 % 50 mL IVPB  Status:  Discontinued     1 g100 mL/hr over 30 Minutes Intravenous Every 24 hours 09/14/14 2238 09/15/14 1730      Assessment/Plan: s/p Procedure(s): ESOPHAGOGASTRODUODENOSCOPY (EGD) (N/A) Continue soft diet  Monitor blood  sugar Can probably transfer to floor once blood sugar is stable Hg stable off blood thinners. Portal vein thrombus  LOS: 16 days    TOTH III,PAUL S 09/30/2014

## 2014-09-30 NOTE — Progress Notes (Signed)
Melinda Conrad 1233- C/O SUDDEN INCREASED EPIGASTRIC PAIN. MEDICATED WITH PERCOCET.SHE WAS HAVING CHILLS. HER HIGHEST TEMP. WHEN CHECKED WAS 99.3 ORALLY. SHE VOMITED LARGE AMTS. X3 WITH BLOOD. DR Verlon Au AND DR Fuller Plan CAME IN TO SEE HER AND THE SURGEON CAME ALSO. DRIPS STARTED. BOLUSES OF EACH GIVEN FIRST. CBG'S Q1H PER ORDERS UNTIL >100 THEN Q2HRS. SB/P LOW -GIVING NS BOLUS.

## 2014-09-30 NOTE — Progress Notes (Signed)
Progress Note   Subjective   Called back to see patient due to hematemesis. She developed shaking chill, nausea, vomiting with small amount of dark red blood this morning. Normal bowel movement yesterday. No bowel movements today. Hb is stable at 8.8 today.    Objective  Vital signs in last 24 hours: Temp:  [98.2 F (36.8 C)-98.7 F (37.1 C)] 98.2 F (36.8 C) (01/10 0820) Resp:  [8-24] 15 (01/10 0900) BP: (90-127)/(40-82) 102/50 mmHg (01/10 0800) SpO2:  [99 %-100 %] 99 % (01/10 0900) Weight:  [133 lb 2.5 oz (60.4 kg)] 133 lb 2.5 oz (60.4 kg) (01/10 0500) Last BM Date: 09/29/14 General:   Alert, well-developed, thin, uncomfortable, acutely and chronically ill appearing Heart:  Regular rate and rhythm; no murmurs Abdomen:  Soft, mild epigastric tenderness and nondistended. Normal bowel sounds, without guarding, and without rebound.   Extremities:  Without edema. Neurologic:  Alert and  oriented x4;  grossly normal neurologically. Psych:  Alert and cooperative. Normal mood and affect.  Intake/Output from previous day: 01/09 0701 - 01/10 0700 In: 2130 [P.O.:930; I.V.:1200] Out: 475 [Urine:475] Intake/Output this shift: Total I/O In: 250 [P.O.:150; I.V.:100] Out: -   Lab Results:  Recent Labs  09/28/14 0500 09/29/14 0431 09/30/14 0515  WBC 5.5 4.4 5.0  HGB 9.6* 8.9* 8.8*  HCT 29.1* 26.9* 26.5*  PLT 158 148* 134*   BMET  Recent Labs  09/27/14 1215 09/28/14 0500 09/29/14 0431  NA 137 136 136  K 3.8 3.6 3.4*  CL 116* 111 113*  CO2 19 19 20   GLUCOSE 101* 65* 64*  BUN 6 6 6   CREATININE 0.57 0.64 0.57  CALCIUM 7.1* 7.2* 7.0*   LFT  Recent Labs  09/28/14 0500 09/29/14 0431  PROT 5.4*  --   ALBUMIN 1.1* <1.0*  AST 24  --   ALT 14  --   ALKPHOS 269*  --   BILITOT 0.6  --    PT/INR No results for input(s): LABPROT, INR in the last 72 hours. Hepatitis Panel No results for input(s): HEPBSAG, HCVAB, HEPAIGM, HEPBIGM in the last 72  hours.  Studies/Results: Dg Chest 2 View  09/28/2014   CLINICAL DATA:  Follow-up pleural effusion.  Pancreatic cancer.  EXAM: CHEST  2 VIEW  COMPARISON:  09/22/2014.  FINDINGS: Trachea is midline. Right IJ Port-A-Cath tip is in the SVC. Trachea is midline. Heart size normal. Slight improvement in mixed interstitial and airspace disease bilaterally, without complete resolution. Small bilateral effusions.  IMPRESSION: Slight improvement in mixed interstitial and airspace disease, possibly due to edema, with small bilateral effusions.   Electronically Signed   By: Lorin Picket M.D.   On: 09/28/2014 16:04   Dg Chest Port 1 View  09/29/2014   CLINICAL DATA:  PICC line placement  EXAM: PORTABLE CHEST - 1 VIEW  COMPARISON:  Radiograph 09/28/2014  FINDINGS: Interval placement of a right PICC line with tip in the distal SVC. Right IJ catheter remains with tip in the distal SVC. Normal cardiac silhouette. There is fine interstitial pattern consistent with pulmonary interstitial edema. No pneumothorax. No focal consolidation.  IMPRESSION: 1. Right PICC line in good position. 2. Interstitial edema pattern.   Electronically Signed   By: Suzy Bouchard M.D.   On: 09/29/2014 17:27      Assessment & Plan   Complicated patient S/P Whipple in 07/2014 with recurrent chills, abdominal pain, sepsis. Hematemesis from friable gastroenterostomy anastomosis found at EGD on 1/8. Has PVT however anticoagulants have  been on hold due to recent GI bleeding. Clear liquid diet. PPI bid IV for now. Check PT/INR, PTT. Monitor Hb/Hct. Avoid ASA/NSAIDs. Repeat sepsis evaluation. No plans to repeat EGD at this time.    Principal Problem:   Septic shock Active Problems:   Depression   Protein-calorie malnutrition, severe   Acute GI bleeding   Hemorrhagic shock   Thrombocytopenia   Portal vein thrombosis   Sepsis   Acute respiratory failure with hypoxia   Hypokalemia   Hypomagnesemia   Sepsis associated hypotension    Bacteremia due to Klebsiella pneumoniae   Anemia due to acute blood loss    LOS: 16 days   Malcolm T. Fuller Plan MD 09/30/2014, 11:19 AM

## 2014-09-30 NOTE — Progress Notes (Signed)
Patient ID: Melinda Conrad, female   DOB: 1950/10/25, 64 y.o.   MRN: [id number] Pt had 2-3 episodes of vomiting today. Hg stable. She apparently has a friable area of stomach that is probably the source of the bleeding. With it being a long raw area it may not be very amenable to cautery or oversewing. At this point I would favor trying to manage this medically. Agree with twice a day protonix and adding octreotide drip. Will monitor closely.

## 2014-09-30 NOTE — Progress Notes (Signed)
[facility] PROGRESS NOTE  Melinda Conrad:[id number] DOB: 02/03/1951 DOA: 09/14/2014 PCP: Kevan Ny, MD  Brief History  64 y/o female with history of pancreatitis, hypertension, pancreatic cancer with prior Whipple surgery (08/07/14) with preoperative radiation and chemotherapy. Admit to ICU12/25/2015 hemoglobin was 5.8. with several days of melanotic stools, fever up to 104.67F, and hematemesis.    12/25 CT abd/pelvis showed two ovoid high-density lesions adjacent to the pancreatic stent within the jejunum could represent hemorrhage or hematoma. No additional gross evidence of surgical complication. Gastroenterology was consulted.the patient was placed on an IV Protonix drip.  ?GI bleed was due to chronic NSAID use vs ischemia induced from her hypotension.  EGD was deferred because of the patient's persistent hypotension.  Blood cultures performed at the time of admission revealed Klebsiella pneumoniae.  The patient was initially started on Zosyn, but this was narrowed to ceftriaxone.   CT abd/p for abd pain 09/16/2014 w=new portal vein thrombus, bilateral pleural effusions with bibasilar infiltrates .  Gen. surgery was consulted. After careful consideration amongst all consultants due to the patient's recent GI bleed, the patient was started on a heparin drip.   The patient continued to have progressive thrombocytopenia. Heme/Onc, Dr. Benay Spice was consulted. The patient's heparin drip was subsequently held with the hopes that with her thrombocytopenia will improved with treatment for sepsis. Thrombocytopenia improving.   Unfortunately hemoglobin dropped 9-->6.7, patient complained of left flank pain not relieved by IV pain medications Persistently hypotensive over the course of 1/5-1/6 therefore transferred back to step down unit. Heparin completely discontinued because of new onset anemia She devleoped 2 further epiosdes of N/V of BRB on 09/30/13 and Gen surgery and GI input  were sought  Unclear etilogy of Abd pain- might represent extension of Portal venous thrombosis?  #1 severe sepsis Patient was admitted with sepsis felt to be secondary to her bacteremia. Patient improving clinically. Afebrile,  off pressors. Blood cultures with Klebsiella pneumonia likely seeded from the GI tract.  Was on IV Rocephin-->Zosyn as below CXR showed persisting PNA Await BC x 2 09/27/14  As PCT is 0.2, d/c IV abx 1/8  #2 Klebsiella pneumonia bacteremia Likely seeded from the GI source?  12/30 surveillance blood cultures in the setting of portal vein thromboses negative X5 days. CT scan abdomen pelvis 12/27 = bibasilar infiltrates right middle lobe-new from chest x-ray 12/25 Transition antibiotics from Rocephin->Zosyn 09/26/14 Follow BC x 2 1/7 [ Pending]  #3 hemorrhagic shock/acute blood loss anemia + s/p Whipple T3N1  Re-bleed again 1/10-looks like mixed and old blood?  Patient status post 4 units packed red blood cells.  Transfused 1 unit packed red blood cells 1/6 Transfused  1 U PRBC again 09/27/14  Pantoprazole infusion STAT Octreotide infusion STAT  Saline locked 2/2 to resolution hypotension 1/8 Endoscopy 1/8=Friable anastamosis with bleeding NPO  #4 portal vein thrombosis-unclear if propagating Anticoagulation held secondary to significant thrombocytopenia/bleeding Platelet count improving.  Given events #3, will need to discuss with both general surgery/oncology further plan going forward For now heparin/Lovenox have been completely held  #5 acute respiratory failure Concern for TRALI secondary to transfusions versus acute lung injury from bacteremia.  09/27/14 with crackles RLL Patients with some shortness of breath however states improving.  Patient +4L since admission.  Hold 40 mg of IV Lasix on 12/29, 12/30, 12/31.   #6 thrombocytopenia Likely secondary to sepsis with possible DIC and consumption. Platelet function showing recovery and improving. No  schistocytes on peripheral smear. Patient may have had  low-grade DIC in the setting of sepsis ? Monitor Obtain hemolysis labs if no improvement in cbc's from this pm  #7 acute kidney injury Secondary to sepsis and pre-azotemia secondary to hypotension. Improved and currently at baseline. Monitor with diuresis. Resolved  #8 severe protein calorie malnutrition Patient has been seen by dietitian. Continue current supplements. Diet has been advanced. Follow.  #9 pancreatic adenocarcinoma s/p Whipple Status post Whipple's with preoperative XRT and chemotherapy. Patient has been seen by Dr. Benay Spice of oncology and chemotherapy on hold for now. Outpatient follow-up with oncology.  #10 hypokalemia/hypomagnesemia Likely secondary to diuretics. Repleted.   #11 deconditioning/debility Patient has been seen by physical therapy recommended home health PT.  #12 prophylaxis Protonix for GI prophylaxis. SCDs for DVT prophylaxis.    Code Status: Full Family Communication: Updated patient and family present Disposition Plan: keep on SDU   Consultants:  Palliative care Dr. Hilma Favors 09/19/2014  Gastroenterology Dr. Deatra Ina 09/15/2014  Hematology/oncology Dr. Benay Spice: 09/17/2014  GI reconsulted 09/26/14 Dr. Fuller Plan  Procedures: R IJ CVL 12/25 >>  12/25 CT abd/pelvis: Two ovoid high-density lesions adjacent to the pancreatic stent within the jejunum could represent hemorrhage or hematoma. No additional gross evidence of surgical complication on this non IV and oral contrast exam. Potential small additional clot within the stomach is indeterminate 09/16/2014 CT abdomen and pelvis:Changes consistent with prior Whipple procedure with pancreatic stent in place. Some postoperative free fluid is noted although no organized collection to suggest abscess is noted at this time.  New main portal vein thrombus which is nonocclusive but occupies a majority of the lumen of the main portal vein. There is  some differential enhancement of the liver identified which in part is likely related to this thrombus and timing of the contrast bolus.  New bilateral pleural effusions with bibasilar infiltrates particularly in the right middle lobe. This may be the etiology of the patient's elevated white blood cell count. These are new from the prior exam from 09/14/2014.  Changes of anasarca.  12/25 Admitted with UGIB to ICU. 2 units PRBCs ordered Chest x-ray 09/14/2014, 09/17/2014, 09/19/2014, 09/21/2014 KUB 09/16/2014 2-D echo 09/21/2014  Antibiotics: Ceftriaxone 12/25 >> 12/26 Zosyn 12/26 >>12/28 Rocephin 12/28>>> 09/26/14 Zosyn 1/6---09/28/14  HPI/Subjective:  Events from 1/10 am 3 further bleeds with brb and then dark stool  Objective: Filed Vitals:   09/30/14 1217  BP:   Pulse:   Temp: 98.7 F (37.1 C)  Resp:     Intake/Output Summary (Last 24 hours) at 09/30/14 1245 Last data filed at 09/30/14 0900  Gross per 24 hour  Intake   1710 ml  Output    475 ml  Net   1235 ml   Filed Weights   09/29/14 0433 09/29/14 0443 09/30/14 0500  Weight: 59.2 kg (130 lb 8.2 oz) 60.4 kg (133 lb 2.5 oz) 60.4 kg (133 lb 2.5 oz)    Exam:   General:  Cachectic, oriented in nad  Cardiovascular: RRR  Respiratory: Crackles in the R lower lobes  Abdomen: Soft, some diffuse tenderness to palpation anteriorly, over Spleen and epigastrium   Data Reviewed: Basic Metabolic Panel:  Recent Labs Lab 09/26/14 0434 09/27/14 0144 09/27/14 1215 09/28/14 0500 09/29/14 0431  NA 133* 135 137 136 136  K 3.4* 3.2* 3.8 3.6 3.4*  CL 105 117* 116* 111 113*  CO2 24 21 19 19 20   GLUCOSE 85 63* 101* 65* 64*  BUN 7 7 6 6 6   CREATININE 0.65 0.51 0.57 0.64 0.57  CALCIUM 7.1*  6.7* 7.1* 7.2* 7.0*  PHOS  --   --   --   --  3.2   Liver Function Tests:  Recent Labs Lab 09/24/14 0815 09/27/14 0144 09/27/14 1215 09/28/14 0500 09/29/14 0431  AST 26 20 25 24   --   ALT 16 13 14 14   --   ALKPHOS  156* 265* 240* 269*  --   BILITOT 0.8 0.4 0.7 0.6  --   PROT 6.9 5.3* 5.3* 5.4*  --   ALBUMIN 1.6* 1.2* 1.1* 1.1* <1.0*   No results for input(s): LIPASE, AMYLASE in the last 168 hours. No results for input(s): AMMONIA in the last 168 hours. CBC:  Recent Labs Lab 09/26/14 1945 09/27/14 0144 09/27/14 1215 09/28/14 0500 09/29/14 0431 09/30/14 0515  WBC 5.5 4.9 5.9 5.5 4.4 5.0  NEUTROABS 3.5 3.2 4.2  --  2.6 3.3  HGB 7.6* 7.6* 9.9* 9.6* 8.9* 8.8*  HCT 23.6* 23.5* 30.7* 29.1* 26.9* 26.5*  MCV 91.1 90.0 91.1 88.7 90.0 89.8  PLT 127* 118* 139* 158 148* 134*   Cardiac Enzymes:  Recent Labs Lab 09/27/14 1215 09/27/14 1830 09/27/14 2151  TROPONINI 0.04* 0.03 0.03   BNP (last 3 results) No results for input(s): PROBNP in the last 8760 hours. CBG:  Recent Labs Lab 09/29/14 1654 09/29/14 1955 09/30/14 0015 09/30/14 0348 09/30/14 0816  GLUCAP 95 114* 101* 68* 71    Recent Results (from the past 240 hour(s))  Culture, blood (routine x 2)     Status: None (Preliminary result)   Collection Time: 09/27/14 12:40 PM  Result Value Ref Range Status   Specimen Description BLOOD RIGHT HAND  Final   Special Requests BOTTLES DRAWN AEROBIC ONLY 1CC  Final   Culture   Final           BLOOD CULTURE RECEIVED NO GROWTH TO DATE CULTURE WILL BE HELD FOR 5 DAYS BEFORE ISSUING A FINAL NEGATIVE REPORT Performed at [facility]    Report Status PENDING  Incomplete  Culture, blood (routine x 2)     Status: None (Preliminary result)   Collection Time: 09/27/14 12:45 PM  Result Value Ref Range Status   Specimen Description BLOOD EFT ARM  Final   Special Requests BOTTLES DRAWN AEROBIC AND ANAEROBIC 3CC  Final   Culture   Final           BLOOD CULTURE RECEIVED NO GROWTH TO DATE CULTURE WILL BE HELD FOR 5 DAYS BEFORE ISSUING A FINAL NEGATIVE REPORT Performed at [facility]    Report Status PENDING  Incomplete     Studies: Dg Chest 2 View  09/28/2014   CLINICAL DATA:   Follow-up pleural effusion.  Pancreatic cancer.  EXAM: CHEST  2 VIEW  COMPARISON:  09/22/2014.  FINDINGS: Trachea is midline. Right IJ Port-A-Cath tip is in the SVC. Trachea is midline. Heart size normal. Slight improvement in mixed interstitial and airspace disease bilaterally, without complete resolution. Small bilateral effusions.  IMPRESSION: Slight improvement in mixed interstitial and airspace disease, possibly due to edema, with small bilateral effusions.   Electronically Signed   By: Lorin Picket M.D.   On: 09/28/2014 16:04   Dg Chest Port 1 View  09/29/2014   CLINICAL DATA:  PICC line placement  EXAM: PORTABLE CHEST - 1 VIEW  COMPARISON:  Radiograph 09/28/2014  FINDINGS: Interval placement of a right PICC line with tip in the distal SVC. Right IJ catheter remains with tip in the distal SVC. Normal cardiac silhouette. There  is fine interstitial pattern consistent with pulmonary interstitial edema. No pneumothorax. No focal consolidation.  IMPRESSION: 1. Right PICC line in good position. 2. Interstitial edema pattern.   Electronically Signed   By: Suzy Bouchard M.D.   On: 09/29/2014 17:27    Scheduled Meds: . feeding supplement (ENSURE COMPLETE)  237 mL Oral TID BM  . feeding supplement (ENSURE)  1 Container Oral TID BM  . fentaNYL  25 mcg Transdermal Q72H  . lidocaine  1 patch Transdermal Q24H  . pantoprazole (PROTONIX) IV  40 mg Intravenous Q12H  . potassium chloride  40 mEq Oral Daily  . pregabalin  25 mg Oral QHS  . psyllium  1 packet Oral Daily  . sertraline  100 mg Oral q morning - 10a  . sodium chloride  10-40 mL Intracatheter Q12H  . sodium chloride  10-40 mL Intracatheter Q12H  . sodium chloride  10-40 mL Intracatheter Q12H  . sucralfate  1 g Oral TID BM   Continuous Infusions: . dextrose 1,000 mL (09/30/14 1224)    Principal Problem:   Septic shock Active Problems:   Depression   Protein-calorie malnutrition, severe   Acute GI bleeding   Hemorrhagic shock    Thrombocytopenia   Portal vein thrombosis   Sepsis   Acute respiratory failure with hypoxia   Hypokalemia   Hypomagnesemia   Sepsis associated hypotension   Bacteremia due to Klebsiella pneumoniae   Anemia due to acute blood loss     CRITICAL CARE Performed by: Nita Sells   Total critical care time: Time spent: 40 mins  Critical care time was exclusive of separately billable procedures and treating other patients.  Critical care was necessary to treat or prevent imminent or life-threatening deterioration.  Critical care was time spent personally by me on the following activities: development of treatment plan with patient and/or surrogate as well as nursing, discussions with consultants, evaluation of patient's response to treatment, examination of patient, obtaining history from patient or surrogate, ordering and performing treatments and interventions, ordering and review of laboratory studies, ordering and review of radiographic studies, pulse oximetry and re-evaluation of patient's condition.   Verneita Griffes, MD [facility] [phone]

## 2014-09-30 NOTE — Progress Notes (Signed)
Subjective:  Evaluated the pt along with the staff RN Mrs Stanton Kidney.  Patient appears comfortable, bloody vomiting 2 since this morning. Seen with husband at bedside, complains about some abdominal pain and chills but no fever.  Objective: Temperature 99.3, blood pressure is 101/53 and heart rate is in the 70s to 80s. General: Alert and awake, oriented x3, not in any acute distress. HEENT: anicteric sclera, pupils reactive to light and accommodation, EOMI CVS: S1-S2 clear, no murmur rubs or gallops Chest: clear to auscultation bilaterally, no wheezing, rales or rhonchi Abdomen: Tender abdomen especially periumbilical and epigastric. Extremities: no cyanosis, clubbing or edema noted bilaterally Neuro: Awake and alert, restless exam not done.  Assessment and plan: Continue IV Protonix twice a day. Watch blood pressure, patient might need normal saline boluses if BP started to decrease. Complains about severe abdominal pain, started on 1-2 mg of IV morphine every 4 hours as needed.  Birdie Hopes Pager: [phone] 09/30/2014, 12:05 PM

## 2014-10-01 ENCOUNTER — Encounter (HOSPITAL_COMMUNITY): Payer: Self-pay | Admitting: Gastroenterology

## 2014-10-01 LAB — GLUCOSE, CAPILLARY
GLUCOSE-CAPILLARY: 154 mg/dL — AB (ref 70–99)
GLUCOSE-CAPILLARY: 140 mg/dL — AB (ref 70–99)
GLUCOSE-CAPILLARY: 145 mg/dL — AB (ref 70–99)
Glucose-Capillary: 107 mg/dL — ABNORMAL HIGH (ref 70–99)
Glucose-Capillary: 155 mg/dL — ABNORMAL HIGH (ref 70–99)
Glucose-Capillary: 174 mg/dL — ABNORMAL HIGH (ref 70–99)

## 2014-10-01 LAB — HEMOGLOBIN AND HEMATOCRIT, BLOOD
HCT: 27.8 % — ABNORMAL LOW (ref 36.0–46.0)
HEMOGLOBIN: 9.3 g/dL — AB (ref 12.0–15.0)

## 2014-10-01 MED ORDER — SUCRALFATE 1 GM/10ML PO SUSP
1.0000 g | Freq: Three times a day (TID) | ORAL | Status: DC
  Administered 2014-10-01 – 2014-10-09 (×33): 1 g via ORAL
  Filled 2014-10-01 (×35): qty 10

## 2014-10-01 MED ORDER — CETYLPYRIDINIUM CHLORIDE 0.05 % MT LIQD
7.0000 mL | Freq: Two times a day (BID) | OROMUCOSAL | Status: DC
  Administered 2014-10-01 – 2014-10-09 (×15): 7 mL via OROMUCOSAL

## 2014-10-01 NOTE — Progress Notes (Signed)
Patient ID: Melinda Conrad, female   DOB: 28-Jun-1951, 64 y.o.   MRN: [id number]  [facility] Progress Note  Subjective: Sleeping, family at bedside No further vomiting today  Lovenox stopped hgb 9.3  Objective:  Vital signs in last 24 hours: Temp:  [98.2 F (36.8 C)-99.3 F (37.4 C)] 98.7 F (37.1 C) (01/11 0800) Pulse Rate:  [51-84] 51 (01/10 2200) Resp:  [15-36] 19 (01/11 0800) BP: (73-170)/(32-100) 127/65 mmHg (01/11 0800) SpO2:  [93 %-100 %] 98 % (01/11 0400) Weight:  [137 lb 5.6 oz (62.3 kg)] 137 lb 5.6 oz (62.3 kg) (01/11 0300) Last BM Date: 09/29/14 General:   Alert,  Well-developed,thin AA female     in NAD Heart:  Regular rate and rhythm; no murmurs Pulm;clear ant Abdomen:  Soft, nontender and nondistended. Normal bowel sounds, without guarding, and without rebound.   .  Intake/Output from previous day: 01/10 0701 - 01/11 0700 In: 3305 [P.O.:150; I.V.:2720; Blood:335; IV Piggyback:100] Out: 250 [Urine:250] Intake/Output this shift:    Lab Results:  Recent Labs  09/30/14 0515 09/30/14 1245 09/30/14 1740 10/01/14 0028  WBC 5.0 6.0 9.0  --   HGB 8.8* 8.8* 7.7* 9.3*  HCT 26.5* 26.6* 23.6* 27.8*  PLT 134* 123* 126*  --    BMET  Recent Labs  09/29/14 0431  NA 136  K 3.4*  CL 113*  CO2 20  GLUCOSE 64*  BUN 6  CREATININE 0.57  CALCIUM 7.0*   LFT  Recent Labs  09/29/14 0431  ALBUMIN <1.0*   PT/INR  Recent Labs  09/30/14 1540  LABPROT 18.3*  INR 1.51*     Assessment / Plan: #1 64 yo female s/p Whipple 07/2014 for pancreatic adenocarcinoma, now with anastomotic bleed Last EGD 1/8  diffusely friable . Has been stable overnight Keep NPO today Carafate added ,on Protonix and Octreotide Had been on Lovenox because of PV thrombosis- now discontinued    Principal Problem:   Septic shock Active Problems:   Depression   Protein-calorie malnutrition, severe   Acute GI bleeding   Hemorrhagic shock   Thrombocytopenia  Portal vein thrombosis   Sepsis   Acute respiratory failure with hypoxia   Hypokalemia   Hypomagnesemia   Sepsis associated hypotension   Bacteremia due to Klebsiella pneumoniae   Anemia due to acute blood loss     LOS: 17 days   Amy Esterwood  10/01/2014, 10:43 AM   GI Attending Note  This was discussed with Dr. Barry Dienes.  There are no immediate plans to resume anticoagulation therapy.  Will treat expectantly with regard to GI bleeding.  Should patient and 10 you to bleed I will repeat her endoscopy.  I would also repeat endoscopy if resumption of anticoagulation therapy is anticipated.  Sandy Salaam. Deatra Ina, MD, [facility] [phone]

## 2014-10-01 NOTE — Progress Notes (Signed)
[facility] PROGRESS NOTE  Melinda Conrad VQQ:[id number] DOB: 02-11-1951 DOA: 09/14/2014 PCP: Kevan Ny, MD  Brief History  64 y/o female with history of pancreatitis, hypertension, pancreatic cancer with prior Whipple surgery (08/07/14) with preoperative radiation and chemotherapy. Admit to ICU12/25/2015 hemoglobin was 5.8. with several days of melanotic stools, fever up to 104.59F, and hematemesis.    12/25 CT abd/pelvis showed two ovoid high-density lesions adjacent to the pancreatic stent within the jejunum could represent hemorrhage or hematoma. No additional gross evidence of surgical complication. Gastroenterology was consulted.the patient was placed on an IV Protonix drip.  ?GI bleed was due to chronic NSAID use vs ischemia induced from her hypotension.  EGD was deferred because of the patient's persistent hypotension.  Blood cultures performed at the time of admission revealed Klebsiella pneumoniae.  The patient was initially started on Zosyn, but this was narrowed to ceftriaxone.   CT abd/p for abd pain 09/16/2014 w=new portal vein thrombus, bilateral pleural effusions with bibasilar infiltrates .  Gen. surgery was consulted. After careful consideration amongst all consultants due to the patient's recent GI bleed, the patient was started on a heparin drip.   The patient continued to have progressive thrombocytopenia. Heme/Onc, Dr. Benay Spice was consulted. The patient's heparin drip was subsequently held with the hopes that with her thrombocytopenia will improved with treatment for sepsis. Thrombocytopenia improving.   Unfortunately hemoglobin dropped 9-->6.7, patient complained of left flank pain not relieved by IV pain medications Persistently hypotensive over the course of 1/5-1/6 therefore transferred back to step down unit. Heparin completely discontinued because of new onset anemia She devleoped 2 further epiosdes of N/V of BRB on 09/30/13 and Gen surgery and GI input  were sought  Unclear etilogy of Abd pain- might represent extension of Portal venous thrombosis?  #1 severe sepsis Patient was admitted with sepsis felt to be secondary to her bacteremia. Patient improving clinically. Afebrile,  off pressors.  Was on IV Rocephin-->Zosyn as below CXR showed persisting PNA Await BC x 2 09/27/14  As PCT is 0.2, d/c IV abx 1/8  #2 Klebsiella pneumonia bacteremia Likely seeded from the GI source?  12/30 surveillance blood cultures in the setting of portal vein thromboses negative X5 days. CT scan abdomen pelvis 12/27 = bibasilar infiltrates right middle lobe-new from chest x-ray 12/25 Transition antibiotics from Rocephin->Zosyn 09/26/14 Follow BC x 2 1/7 [ Pending]  #3 hemorrhagic shock/acute blood loss anemia + s/p Whipple T3N1   Re-bleed again 1/10-looks like mixed and old blood?  Patient status post 4 units packed red blood cells.  Transfused 1 unit packed red blood cells 1/6 Transfused  1 U PRBC again 09/27/14  Pantoprazole infusion 1/10 Octreotide infusion 1/10  Endoscopy 1/8=Friable anastamosis with bleeding NPO  Rest as per GI and Gen surgery  #4 portal vein thrombosis-unclear if propagating Anticoagulation held secondary to significant thrombocytopenia/bleeding Platelet count improving.  For now heparin/Lovenox have been completely held  #5 acute respiratory failure Concern for TRALI secondary to transfusions versus acute lung injury from bacteremia.  09/27/14 with crackles RLL Patients with some shortness of breath however states improving.  Patient Net +16 L since admission.  Hold 40 mg of IV Lasix on 12/29, 12/30, 12/31.   #6 thrombocytopenia 2/2 to DIC and massive ABLA-Nadir = PLT 21 on 09/17/14 Likely secondary to sepsis with possible DIC and consumption.  Current PLT count 126 Platelet function showing recovery and improving.  No schistocytes on peripheral smear. Patient may have had low-grade DIC in the setting of  sepsis ?  #7  acute kidney injury Secondary to sepsis and pre-azotemia secondary to hypotension. Improved and currently at baseline. Monitor with diuresis. Resolved  #8 severe protein calorie malnutrition Patient has been seen by dietitian. Consider TPN as per Gen surg?  #9 pancreatic adenocarcinoma s/p Whipple Status post Whipple's with preoperative XRT and chemotherapy. Patient has been seen by Dr. Benay Spice of oncology and chemotherapy on hold for now. Outpatient follow-up with oncology.  #10 hypokalemia/hypomagnesemia Likely secondary to diuretics. Repleted.   #11 deconditioning/debility Patient has been seen by physical therapy recommended home health PT.  #12 prophylaxis Protonix for GI prophylaxis. SCDs for DVT prophylaxis.    Code Status: Full Family Communication: Updated patient and family present Disposition Plan: keep on SDU   Consultants:  Palliative care Dr. Hilma Favors 09/19/2014  Gastroenterology Dr. Deatra Ina 09/15/2014  Hematology/oncology Dr. Benay Spice: 09/17/2014  GI reconsulted 09/26/14 Dr. Fuller Plan  Procedures: R IJ CVL 12/25 >>  12/25 CT abd/pelvis: Two ovoid high-density lesions adjacent to the pancreatic stent within the jejunum could represent hemorrhage or hematoma. No additional gross evidence of surgical complication on this non IV and oral contrast exam. Potential small additional clot within the stomach is indeterminate 09/16/2014 CT abdomen and pelvis:Changes consistent with prior Whipple procedure with pancreatic stent in place. Some postoperative free fluid is noted although no organized collection to suggest abscess is noted at this time.  New main portal vein thrombus which is nonocclusive but occupies a majority of the lumen of the main portal vein. There is some differential enhancement of the liver identified which in part is likely related to this thrombus and timing of the contrast bolus.  New bilateral pleural effusions with bibasilar  infiltrates particularly in the right middle lobe. This may be the etiology of the patient's elevated white blood cell count. These are new from the prior exam from 09/14/2014.  Changes of anasarca.  12/25 Admitted with UGIB to ICU. 2 units PRBCs ordered Chest x-ray 09/14/2014, 09/17/2014, 09/19/2014, 09/21/2014 KUB 09/16/2014 2-D echo 09/21/2014  Antibiotics: Ceftriaxone 12/25 >> 12/26 Zosyn 12/26 >>12/28 Rocephin 12/28>>> 09/26/14 Zosyn 1/6---09/28/14  HPI/Subjective:  Periumbilical intermittent 8/33 pain Worsened by "stomach shot" NO cp No further bleed upper or lower thirsty ++ Wants water  Objective: Filed Vitals:   10/01/14 1200  BP: 137/74  Pulse:   Temp:   Resp: 29    Intake/Output Summary (Last 24 hours) at 10/01/14 1256 Last data filed at 10/01/14 1100  Gross per 24 hour  Intake   4855 ml  Output    400 ml  Net   4455 ml   Filed Weights   09/29/14 0443 09/30/14 0500 10/01/14 0300  Weight: 60.4 kg (133 lb 2.5 oz) 60.4 kg (133 lb 2.5 oz) 62.3 kg (137 lb 5.6 oz)    Exam:   General:  Cachectic, oriented in nad  Cardiovascular: RRR  Respiratory: Crackles in the R lower lobes  Abdomen: Soft, some diffuse tenderness to palpation anteriorly, over Spleen and epigastrium   Data Reviewed: Basic Metabolic Panel:  Recent Labs Lab 09/26/14 0434 09/27/14 0144 09/27/14 1215 09/28/14 0500 09/29/14 0431  NA 133* 135 137 136 136  K 3.4* 3.2* 3.8 3.6 3.4*  CL 105 117* 116* 111 113*  CO2 24 21 19 19 20   GLUCOSE 85 63* 101* 65* 64*  BUN 7 7 6 6 6   CREATININE 0.65 0.51 0.57 0.64 0.57  CALCIUM 7.1* 6.7* 7.1* 7.2* 7.0*  PHOS  --   --   --   --  3.2   Liver Function Tests:  Recent Labs Lab 09/27/14 0144 09/27/14 1215 09/28/14 0500 09/29/14 0431  AST 20 25 24   --   ALT 13 14 14   --   ALKPHOS 265* 240* 269*  --   BILITOT 0.4 0.7 0.6  --   PROT 5.3* 5.3* 5.4*  --   ALBUMIN 1.2* 1.1* 1.1* <1.0*   No results for input(s): LIPASE, AMYLASE in the  last 168 hours. No results for input(s): AMMONIA in the last 168 hours. CBC:  Recent Labs Lab 09/27/14 0144 09/27/14 1215 09/28/14 0500 09/29/14 0431 09/30/14 0515 09/30/14 1245 09/30/14 1740 10/01/14 0028  WBC 4.9 5.9 5.5 4.4 5.0 6.0 9.0  --   NEUTROABS 3.2 4.2  --  2.6 3.3  --  7.3  --   HGB 7.6* 9.9* 9.6* 8.9* 8.8* 8.8* 7.7* 9.3*  HCT 23.5* 30.7* 29.1* 26.9* 26.5* 26.6* 23.6* 27.8*  MCV 90.0 91.1 88.7 90.0 89.8 89.9 90.1  --   PLT 118* 139* 158 148* 134* 123* 126*  --    Cardiac Enzymes:  Recent Labs Lab 09/27/14 1215 09/27/14 1830 09/27/14 2151  TROPONINI 0.04* 0.03 0.03   BNP (last 3 results) No results for input(s): PROBNP in the last 8760 hours. CBG:  Recent Labs Lab 09/30/14 1607 09/30/14 1815 10/01/14 0035 10/01/14 0420 10/01/14 0814  GLUCAP 119* 136* 174* 154* 107*    Recent Results (from the past 240 hour(s))  Culture, blood (routine x 2)     Status: None (Preliminary result)   Collection Time: 09/27/14 12:40 PM  Result Value Ref Range Status   Specimen Description BLOOD RIGHT HAND  Final   Special Requests BOTTLES DRAWN AEROBIC ONLY 1CC  Final   Culture   Final           BLOOD CULTURE RECEIVED NO GROWTH TO DATE CULTURE WILL BE HELD FOR 5 DAYS BEFORE ISSUING A FINAL NEGATIVE REPORT Performed at [facility]    Report Status PENDING  Incomplete  Culture, blood (routine x 2)     Status: None (Preliminary result)   Collection Time: 09/27/14 12:45 PM  Result Value Ref Range Status   Specimen Description BLOOD EFT ARM  Final   Special Requests BOTTLES DRAWN AEROBIC AND ANAEROBIC 3CC  Final   Culture   Final           BLOOD CULTURE RECEIVED NO GROWTH TO DATE CULTURE WILL BE HELD FOR 5 DAYS BEFORE ISSUING A FINAL NEGATIVE REPORT Performed at [facility]    Report Status PENDING  Incomplete     Studies: Dg Chest Port 1 View  09/29/2014   CLINICAL DATA:  PICC line placement  EXAM: PORTABLE CHEST - 1 VIEW  COMPARISON:  Radiograph  09/28/2014  FINDINGS: Interval placement of a right PICC line with tip in the distal SVC. Right IJ catheter remains with tip in the distal SVC. Normal cardiac silhouette. There is fine interstitial pattern consistent with pulmonary interstitial edema. No pneumothorax. No focal consolidation.  IMPRESSION: 1. Right PICC line in good position. 2. Interstitial edema pattern.   Electronically Signed   By: Suzy Bouchard M.D.   On: 09/29/2014 17:27    Scheduled Meds: . diphenhydrAMINE  25 mg Oral Once  . feeding supplement (ENSURE COMPLETE)  237 mL Oral TID BM  . feeding supplement (ENSURE)  1 Container Oral TID BM  . fentaNYL  25 mcg Transdermal Q72H  . furosemide  20 mg Intravenous Once  . lidocaine  1 patch Transdermal Q24H  . [START ON 10/04/2014] pantoprazole (PROTONIX) IV  40 mg Intravenous Q12H  . potassium chloride  40 mEq Oral Daily  . pregabalin  25 mg Oral QHS  . sertraline  100 mg Oral q morning - 10a  . sodium chloride  10-40 mL Intracatheter Q12H  . sucralfate  1 g Oral TID WC & HS   Continuous Infusions: . dextrose 5 % and 0.9% NaCl 1,000 mL infusion 100 mL/hr at 10/01/14 0750  . octreotide  (SANDOSTATIN)    IV infusion 50 mcg/hr (10/01/14 1106)  . pantoprozole (PROTONIX) infusion 8 mg/hr (10/01/14 1240)    Principal Problem:   Septic shock Active Problems:   Depression   Protein-calorie malnutrition, severe   Acute GI bleeding   Hemorrhagic shock   Thrombocytopenia   Portal vein thrombosis   Sepsis   Acute respiratory failure with hypoxia   Hypokalemia   Hypomagnesemia   Sepsis associated hypotension   Bacteremia due to Klebsiella pneumoniae   Anemia due to acute blood loss   Verneita Griffes, MD [facility] (P) [phone]

## 2014-10-01 NOTE — Progress Notes (Signed)
Family assisted pt oob without help of staff, stating that they called for help but that she could not wait and had to go to the BR. Pt found in recliner with leads pulled off and picc tubing pulled apart and bleeding onto floor. Pt had a lg BM and voided in bedpan which was found in BR. Picc flushed and intact; pt cleansed and returned to bed. Both pt and family instructed to please wait for staff to assist in getting OOB. Franki Stemen, Beverly Gust, RN

## 2014-10-01 NOTE — Progress Notes (Signed)
Patient ID: Melinda Conrad, female   DOB: 09/19/1951, 64 y.o.   MRN: [id number] 3 Days Post-Op  Subjective: Had 3 more episodes of hematemesis yesterday with hypotension.    Objective: Vital signs in last 24 hours: Temp:  [98.2 F (36.8 C)-99.3 F (37.4 C)] 98.6 F (37 C) (01/11 0400) Pulse Rate:  [51-84] 51 (01/10 2200) Resp:  [15-36] 16 (01/11 0600) BP: (73-170)/(32-100) 115/61 mmHg (01/11 0600) SpO2:  [93 %-100 %] 98 % (01/11 0400) Weight:  [137 lb 5.6 oz (62.3 kg)] 137 lb 5.6 oz (62.3 kg) (01/11 0300) Last BM Date: 09/29/14  Intake/Output from previous day: 01/10 0701 - 01/11 0700 In: 3305 [P.O.:150; I.V.:2720; Blood:335; IV Piggyback:100] Out: 250 [Urine:250] Intake/Output this shift:    Resp: breathing comfortably Cardio: regular rate and rhythm GI: soft, nontender  Lab Results:   Recent Labs  09/30/14 1245 09/30/14 1740 10/01/14 0028  WBC 6.0 9.0  --   HGB 8.8* 7.7* 9.3*  HCT 26.6* 23.6* 27.8*  PLT 123* 126*  --    BMET  Recent Labs  09/29/14 0431  NA 136  K 3.4*  CL 113*  CO2 20  GLUCOSE 64*  BUN 6  CREATININE 0.57  CALCIUM 7.0*   PT/INR  Recent Labs  09/30/14 1540  LABPROT 18.3*  INR 1.51*   ABG No results for input(s): PHART, HCO3 in the last 72 hours.  Invalid input(s): PCO2, PO2  Studies/Results: Dg Chest Port 1 View  09/29/2014   CLINICAL DATA:  PICC line placement  EXAM: PORTABLE CHEST - 1 VIEW  COMPARISON:  Radiograph 09/28/2014  FINDINGS: Interval placement of a right PICC line with tip in the distal SVC. Right IJ catheter remains with tip in the distal SVC. Normal cardiac silhouette. There is fine interstitial pattern consistent with pulmonary interstitial edema. No pneumothorax. No focal consolidation.  IMPRESSION: 1. Right PICC line in good position. 2. Interstitial edema pattern.   Electronically Signed   By: Suzy Bouchard M.D.   On: 09/29/2014 17:27    Anti-infectives: Anti-infectives    Start     Dose/Rate Route  Frequency Ordered Stop   09/26/14 1400  piperacillin-tazobactam (ZOSYN) IVPB 3.375 g  Status:  Discontinued     3.375 g12.5 mL/hr over 240 Minutes Intravenous 3 times per day 09/26/14 1342 09/28/14 1649   09/17/14 1200  cefTRIAXone (ROCEPHIN) 2 g in dextrose 5 % 50 mL IVPB - Premix  Status:  Discontinued     2 g100 mL/hr over 30 Minutes Intravenous Every 24 hours 09/17/14 1013 09/26/14 1315   09/15/14 1800  piperacillin-tazobactam (ZOSYN) IVPB 3.375 g  Status:  Discontinued     3.375 g12.5 mL/hr over 240 Minutes Intravenous Every 8 hours 09/15/14 1741 09/17/14 1013   09/14/14 2245  cefTRIAXone (ROCEPHIN) 1 g in dextrose 5 % 50 mL IVPB  Status:  Discontinued     1 g100 mL/hr over 30 Minutes Intravenous Every 24 hours 09/14/14 2238 09/15/14 1730      Assessment/Plan: s/p Procedure(s): ESOPHAGOGASTRODUODENOSCOPY (EGD) (N/A) Soft diet after stable. Monitor blood sugar Start carafate.  Continue IV protonix and octreotide.   Likely staple line bleed at [place]  LOS: 17 days    [facility] 10/01/2014

## 2014-10-01 NOTE — Significant Event (Signed)
My thoughts are any fever in this patient has been proven culture neg multiple x's.  I feel she might be infarcting her spleen which would go well with the RUQ pain, fever, as GI bleed [unless a m-w is usually painless] Defer other DDx  to GI and Gen surg?  Verneita Griffes, MD [facility] [phone]

## 2014-10-01 NOTE — Progress Notes (Signed)
NUTRITION FOLLOW-UP  INTERVENTION: -Continue Ensure TID as diet advancement tolerated -RD to continue to monitor  NUTRITION DIAGNOSIS: Inadequate oral intake related to nausea/abd pain/decreased appetite as evidenced by PO intake < 75%, 10 lb wt loss in one month, ongoing.  Goal: Pt to meet >/= 90% of their estimated nutrition needs, not met   Monitor:  Diet order, total protein/energy intake, labs, weights, GI profile, education needs  ASSESSMENT: Admitted to PCCM service via ED with melena X several days, isolated fever on evening prior to admission (to 104.0 and trated with ibuprofen), episode of hematemesis on evening of presentation to ED for which EMS was dispatched  12/28 -Pt familiar to RD from previous admit in 07/2014 -Pt s/p Whipple procedure; was on IMPACT supplementation trial that required  3 supplements/day for 5 days pre op and 5 days post op .  -Pt continued to be followed by outpatient oncology RD post d/c. Was last seen on 08/24/14. RD had encouraged pt to consume Ensure TID, and was provided with complimentary case of supplement. RD also educated pt on increasing intake of high protein/kcal foods -Pt lethargic during initial RD assessment today. Pt reported ongoing decreased appetite, and not able to tolerate significant intake of solid foods. Was drinking Ensure at home, but was unable to quantify amount daily -Endorsed ongoing weight loss of 10 lb in past one month (8.3% body weight loss, severe for time frame) -NPO for GI bleed. Possible EGD pending surgery recommendations. Palliative care c/s pending. -Refeeding risk d/t chronic illness, severe wt loss, and prolonged period of sup-optimal intake ( > one  Month) -Phos/Mg/K low on admit. Mg now WNL w/repletion  09/24/14 -Pt now on regular diet. PO intake: 50-100% -Pt's weight is +8 lb since 12/28 -Pt is receiving Ensure supplements, states she is drinking them. -Pt states that she plans to continue drinking  supplements at home. Pt feels better today. -Will continue current interventions  10/01/14: - Pt NPO due to hematemesis. Recommend soft diet when stable.  - Pt reports good appetite prior to vomiting. Pt currently hungry. She reports that she was drinking Ensure prior to NPO. Recommend continuing supplements once diet advanced.  - Wt has trended up from 111 lbs on 12/28. Current weight is 133 lbs. Pt in positive fluid balance (+16 L since admission).   Height: Ht Readings from Last 1 Encounters:  10/01/14 '5\' 3"'  (1.6 m)    Weight: Wt Readings from Last 1 Encounters:  10/01/14 137 lb 5.6 oz (62.3 kg)   BMI:  Body mass index is 24.34 kg/(m^2).  Estimated Nutritional Needs: Kcal:1600-1800 Protein: 90-100 gram Fluid: >/= 1600 ml daily  Skin: surgical incision on abd (port)  Diet Order: Diet NPO time specified  EDUCATION NEEDS: -No education needs identified at this time   Intake/Output Summary (Last 24 hours) at 10/01/14 1435 Last data filed at 10/01/14 1100  Gross per 24 hour  Intake   4555 ml  Output    400 ml  Net   4155 ml    Last BM: 1/9  Labs:   Recent Labs Lab 09/27/14 1215 09/28/14 0500 09/29/14 0431  NA 137 136 136  K 3.8 3.6 3.4*  CL 116* 111 113*  CO2 '19 19 20  ' BUN '6 6 6  ' CREATININE 0.57 0.64 0.57  CALCIUM 7.1* 7.2* 7.0*  PHOS  --   --  3.2  GLUCOSE 101* 65* 64*    CBG (last 3)   Recent Labs  10/01/14 0420 10/01/14 0175  10/01/14 1150  GLUCAP 154* 107* 140*    Scheduled Meds: . diphenhydrAMINE  25 mg Oral Once  . feeding supplement (ENSURE COMPLETE)  237 mL Oral TID BM  . feeding supplement (ENSURE)  1 Container Oral TID BM  . fentaNYL  25 mcg Transdermal Q72H  . lidocaine  1 patch Transdermal Q24H  . [START ON 10/04/2014] pantoprazole (PROTONIX) IV  40 mg Intravenous Q12H  . potassium chloride  40 mEq Oral Daily  . pregabalin  25 mg Oral QHS  . sertraline  100 mg Oral q morning - 10a  . sodium chloride  10-40 mL Intracatheter Q12H   . sucralfate  1 g Oral TID WC & HS    Continuous Infusions: . dextrose 5 % and 0.9% NaCl 1,000 mL infusion 100 mL/hr at 10/01/14 0750  . octreotide  (SANDOSTATIN)    IV infusion 50 mcg/hr (10/01/14 1106)  . pantoprozole (PROTONIX) infusion 8 mg/hr (10/01/14 1240)   Laurette Schimke MS, RD, LDN

## 2014-10-02 ENCOUNTER — Inpatient Hospital Stay (HOSPITAL_COMMUNITY): Payer: Medicaid Other

## 2014-10-02 LAB — PHOSPHORUS: PHOSPHORUS: 2.6 mg/dL (ref 2.3–4.6)

## 2014-10-02 LAB — PROTIME-INR
INR: 1.51 — ABNORMAL HIGH (ref 0.00–1.49)
Prothrombin Time: 18.3 seconds — ABNORMAL HIGH (ref 11.6–15.2)

## 2014-10-02 LAB — COMPREHENSIVE METABOLIC PANEL
ALT: 15 U/L (ref 0–35)
AST: 20 U/L (ref 0–37)
Albumin: 1 g/dL — ABNORMAL LOW (ref 3.5–5.2)
Alkaline Phosphatase: 488 U/L — ABNORMAL HIGH (ref 39–117)
Anion gap: 3 — ABNORMAL LOW (ref 5–15)
BUN: 9 mg/dL (ref 6–23)
CHLORIDE: 116 meq/L — AB (ref 96–112)
CO2: 19 mmol/L (ref 19–32)
Calcium: 7 mg/dL — ABNORMAL LOW (ref 8.4–10.5)
Creatinine, Ser: 0.59 mg/dL (ref 0.50–1.10)
GFR calc Af Amer: >90 mL/min (ref >90)
Glucose, Bld: 125 mg/dL — ABNORMAL HIGH (ref 70–99)
Potassium: 3.5 mmol/L (ref 3.5–5.1)
Sodium: 138 mmol/L (ref 135–145)
Total Bilirubin: 0.7 mg/dL (ref 0.3–1.2)
Total Protein: 5.4 g/dL — ABNORMAL LOW (ref 6.0–8.3)

## 2014-10-02 LAB — CBC WITH DIFFERENTIAL/PLATELET
BASOS ABS: 0 10*3/uL (ref 0.0–0.1)
BASOS PCT: 0 % (ref 0–1)
EOS ABS: 0.1 10*3/uL (ref 0.0–0.7)
EOS PCT: 1 % (ref 0–5)
HEMATOCRIT: 27.8 % — AB (ref 36.0–46.0)
HEMOGLOBIN: 9.1 g/dL — AB (ref 12.0–15.0)
Lymphocytes Relative: 21 % (ref 12–46)
Lymphs Abs: 1.1 10*3/uL (ref 0.7–4.0)
MCH: 29.5 pg (ref 26.0–34.0)
MCHC: 32.7 g/dL (ref 30.0–36.0)
MCV: 90.3 fL (ref 78.0–100.0)
MONO ABS: 0.5 10*3/uL (ref 0.1–1.0)
MONOS PCT: 10 % (ref 3–12)
Neutro Abs: 3.4 10*3/uL (ref 1.7–7.7)
Neutrophils Relative %: 67 % (ref 43–77)
Platelets: 97 10*3/uL — ABNORMAL LOW (ref 150–400)
RBC: 3.08 MIL/uL — ABNORMAL LOW (ref 3.87–5.11)
RDW: 16.7 % — AB (ref 11.5–15.5)
WBC: 5.1 10*3/uL (ref 4.0–10.5)

## 2014-10-02 LAB — GLUCOSE, CAPILLARY
GLUCOSE-CAPILLARY: 99 mg/dL (ref 70–99)
Glucose-Capillary: 129 mg/dL — ABNORMAL HIGH (ref 70–99)
Glucose-Capillary: 104 mg/dL — ABNORMAL HIGH (ref 70–99)

## 2014-10-02 LAB — MAGNESIUM: Magnesium: 1.4 mg/dL — ABNORMAL LOW (ref 1.5–2.5)

## 2014-10-02 MED ORDER — DEXTROSE-NACL 5-0.9 % IV SOLN
INTRAVENOUS | Status: DC
  Administered 2014-10-02: 18:00:00 via INTRAVENOUS
  Filled 2014-10-02 (×2): qty 1000

## 2014-10-02 MED ORDER — TRACE MINERALS CR-CU-F-FE-I-MN-MO-SE-ZN IV SOLN
INTRAVENOUS | Status: AC
  Administered 2014-10-02: 18:00:00 via INTRAVENOUS
  Filled 2014-10-02: qty 960

## 2014-10-02 MED ORDER — FAT EMULSION 20 % IV EMUL
240.0000 mL | INTRAVENOUS | Status: AC
  Administered 2014-10-02: 240 mL via INTRAVENOUS
  Filled 2014-10-02: qty 250

## 2014-10-02 MED ORDER — INSULIN ASPART 100 UNIT/ML ~~LOC~~ SOLN
0.0000 [IU] | Freq: Four times a day (QID) | SUBCUTANEOUS | Status: DC
  Administered 2014-10-03 – 2014-10-04 (×3): 1 [IU] via SUBCUTANEOUS
  Administered 2014-10-04 (×2): 2 [IU] via SUBCUTANEOUS
  Administered 2014-10-04: 1 [IU] via SUBCUTANEOUS

## 2014-10-02 MED ORDER — DEXTROSE 5 % IV SOLN
10.0000 mmol | Freq: Once | INTRAVENOUS | Status: AC
  Administered 2014-10-02: 10 mmol via INTRAVENOUS
  Filled 2014-10-02: qty 3.33

## 2014-10-02 MED ORDER — MAGNESIUM SULFATE 2 GM/50ML IV SOLN
2.0000 g | Freq: Once | INTRAVENOUS | Status: AC
  Administered 2014-10-02: 2 g via INTRAVENOUS
  Filled 2014-10-02: qty 50

## 2014-10-02 NOTE — Progress Notes (Signed)
PARENTERAL NUTRITION CONSULT NOTE - INITIAL  Pharmacy Consult for TPN Indication: Severe protein calorie malnutrion  No Known Allergies  Patient Measurements: Height: 5' 3" (160 cm) Weight: 145 lb 8.1 oz (66 kg) IBW/kg (Calculated) : 52.4 Adjusted Body Weight: 56.5 kg  Vital Signs: Temp: 98.4 F (36.9 C) (01/12 0800) Temp Source: Oral (01/12 0800) BP: 128/76 mmHg (01/12 1000) Pulse Rate: 53 (01/12 1000) Intake/Output from previous day: 01/11 0701 - 01/12 0700 In: 3350 [I.V.:3350] Out: 1150 [Urine:1150] Intake/Output from this shift:    Labs:  Recent Labs  09/30/14 0515 09/30/14 1245 09/30/14 1540 09/30/14 1740 10/01/14 0028  WBC 5.0 6.0  --  9.0  --   HGB 8.8* 8.8*  --  7.7* 9.3*  HCT 26.5* 26.6*  --  23.6* 27.8*  PLT 134* 123*  --  126*  --   APTT  --   --  41*  --   --   INR  --   --  1.51*  --   --      Recent Labs  10/02/14 1015  NA 138  K 3.5  CL 116*  CO2 19  GLUCOSE 125*  BUN 9  CREATININE 0.59  CALCIUM 7.0*  MG 1.4*  PHOS 2.6  PROT 5.4*  ALBUMIN PENDING  AST 20  ALT 15  ALKPHOS 488*  BILITOT 0.7   Estimated Creatinine Clearance: 67.4 mL/min (by C-G formula based on Cr of 0.59).    Recent Labs  10/01/14 1637 10/01/14 2006 10/02/14 0311  GLUCAP 155* 145* 129*    Medical History: Past Medical History  Diagnosis Date  . Arthritis   . Depression   . H/O: substance abuse   . Back pain   . Personal history of colonic polyp - adenoma 10/26/2013    10/26/2013 diminutive sigmoid polyp removed    . Pancreatitis, acute   . Hypertension   . Osteoporosis   . Radiation 04/30/14-06/08/14    pancreas 50 gray  . Cancer     pancreatic cancer   Insulin Requirements: None  Current Nutrition: NPO, not receiving Ensure supplements  IVF: D5NS at 50 ml/hr  Central access: PICC TPN start date: 10/02/14  ASSESSMENT                                                                                                          HPI:  21 y/oF with PMH  of pancreatitis, HTN, pancreatic cancer with prior Whipple surgery (08/07/14) with preoperative radiation and chemotherapy admitted to ICU on 09/14/2014 with Hgb 5.8, several days of melanotic stools, fever, hematemesis, sepsis secondary to bacteremia.  Patient is s/p EGD on 1/8 with friable areas at anastomosis.  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  Pharmacy consulted today to start TPN for this patient.    Significant events:   Today:    Glucose - CBGs 107-155  Electrolytes - K, Phos on low end of normal, Mag low at 1.4, Corrected Ca WNL  Renal -SCr WNL, stable  LFTs - AST/ALT WNL, Alk Phos elevated  at 488  TGs -pending  Prealbumin -pending  NUTRITIONAL GOALS                                                                                             RD recs: [id number]:[id number] Protein: 90-100 gram Fluid: >/= 1600 ml daily Clinimix 5/15 at a goal rate of 38m/hr + 20% fat emulsion at 173mhr to provide: 90g/day protein, 1758Kcal/day.  PLAN      Magnesium 2 grams IV x 1.  Then KPhos 10 mmol IV x 1.                                                                                                                At 1800 today:  Start Clinimix E 5/15 at 40 ml/hr.  20% fat emulsion at 10 ml/hr.  Plan to advance as tolerated to the goal rate.  TPN to contain standard multivitamins and trace elements.  Reduce IVF to 10 ml/hr.  Add sensitive SSI q6h..   CMET, Mag, Phos in AM.  TPN lab panels on Mondays & Thursdays.  F/u daily.   JiLindell SparPharmD, BCPS Pager: [phone]/08/2015 2:34 PM

## 2014-10-02 NOTE — Progress Notes (Signed)
Patient ID: Melinda Conrad, female   DOB: 10/17/1950, 64 y.o.   MRN: [id number] 4 Days Post-Op  Subjective: No emesis yesterday.  Objective: Vital signs in last 24 hours: Temp:  [98.9 F (37.2 C)-99.1 F (37.3 C)] 99 F (37.2 C) (01/12 0400) Pulse Rate:  [59-80] 59 (01/12 0600) Resp:  [13-29] 13 (01/12 0600) BP: (99-176)/(55-88) 99/56 mmHg (01/12 0600) SpO2:  [97 %-99 %] 97 % (01/12 0600) Weight:  [145 lb 8.1 oz (66 kg)] 145 lb 8.1 oz (66 kg) (01/12 0600) Last BM Date: 10/01/14  Intake/Output from previous day: 01/11 0701 - 01/12 0700 In: 3350 [I.V.:3350] Out: 1150 [Urine:1150] Intake/Output this shift:    Resp: breathing comfortably Cardio: regular rate and rhythm GI: soft, nontender  Lab Results:   Recent Labs  09/30/14 1245 09/30/14 1740 10/01/14 0028  WBC 6.0 9.0  --   HGB 8.8* 7.7* 9.3*  HCT 26.6* 23.6* 27.8*  PLT 123* 126*  --    BMET No results for input(s): NA, K, CL, CO2, GLUCOSE, BUN, CREATININE, CALCIUM in the last 72 hours. PT/INR  Recent Labs  09/30/14 1540  LABPROT 18.3*  INR 1.51*   ABG No results for input(s): PHART, HCO3 in the last 72 hours.  Invalid input(s): PCO2, PO2  Studies/Results: No results found.  Anti-infectives: Anti-infectives    Start     Dose/Rate Route Frequency Ordered Stop   09/26/14 1400  piperacillin-tazobactam (ZOSYN) IVPB 3.375 g  Status:  Discontinued     3.375 g12.5 mL/hr over 240 Minutes Intravenous 3 times per day 09/26/14 1342 09/28/14 1649   09/17/14 1200  cefTRIAXone (ROCEPHIN) 2 g in dextrose 5 % 50 mL IVPB - Premix  Status:  Discontinued     2 g100 mL/hr over 30 Minutes Intravenous Every 24 hours 09/17/14 1013 09/26/14 1315   09/15/14 1800  piperacillin-tazobactam (ZOSYN) IVPB 3.375 g  Status:  Discontinued     3.375 g12.5 mL/hr over 240 Minutes Intravenous Every 8 hours 09/15/14 1741 09/17/14 1013   09/14/14 2245  cefTRIAXone (ROCEPHIN) 1 g in dextrose 5 % 50 mL IVPB  Status:  Discontinued     1 g100  mL/hr over 30 Minutes Intravenous Every 24 hours 09/14/14 2238 09/15/14 1730      Assessment/Plan: s/p Procedure(s): ESOPHAGOGASTRODUODENOSCOPY (EGD) (N/A) Soft diet after stable.  Agree with TNA. Monitor blood sugar Carafate.  Continue IV protonix and octreotide.   Likely staple line bleed at [place].     LOS: 18 days    Melinda Conrad 10/02/2014

## 2014-10-02 NOTE — Progress Notes (Signed)
Patient ID: Melinda Conrad, female   DOB: [birth year], 64 y.o.   MRN: [id number]  [facility] Progress Note  Subjective: Getting ready to play backgammon- feels better- no vomiting. No c/o pain, Hungry hgb 9.3 stable Objective:  Vital signs in last 24 hours: Temp:  [98.4 F (36.9 C)-99.1 F (37.3 C)] 98.4 F (36.9 C) (01/12 0800) Pulse Rate:  [55-80] 55 (01/12 0800) Resp:  [13-29] 17 (01/12 0800) BP: (99-176)/(55-88) 116/73 mmHg (01/12 0800) SpO2:  [97 %-99 %] 98 % (01/12 0800) Weight:  [145 lb 8.1 oz (66 kg)] 145 lb 8.1 oz (66 kg) (01/12 0600) Last BM Date: 10/01/14 General:   Alert,  Well-developed,  AA female   in NAD Heart:  Regular rate and rhythm; no murmurs Pulm;clear Abdomen:  Soft, full, no focal tenderness. Normal bowel sounds, without guarding, and without rebound.   Extremities:  Without edema. Neurologic:  Alert and  oriented x4;  grossly normal neurologically. Psych:  Alert and cooperative. Normal mood and affect.  Intake/Output from previous day: 01/11 0701 - 01/12 0700 In: 3350 [I.V.:3350] Out: 1150 [Urine:1150] Intake/Output this shift:    Lab Results:  Recent Labs  09/30/14 0515 09/30/14 1245 09/30/14 1740 10/01/14 0028  WBC 5.0 6.0 9.0  --   HGB 8.8* 8.8* 7.7* 9.3*  HCT 26.5* 26.6* 23.6* 27.8*  PLT 134* 123* 126*  --    BMET No results for input(s): NA, K, CL, CO2, GLUCOSE, BUN, CREATININE, CALCIUM in the last 72 hours. LFT No results for input(s): PROT, ALBUMIN, AST, ALT, ALKPHOS, BILITOT, BILIDIR, IBILI in the last 72 hours. PT/INR  Recent Labs  09/30/14 1540  LABPROT 18.3*  INR 1.51*     Assessment / Plan: #1 64 yo female with pancreatic adenocarcinoma s/p Whipple 07/2014 with Gi bleed secondary to anastomotic friability Stable- no active bleeding in over 24 hours Will defer feeding to Dr. Barry Dienes- ok to start clears  Continue Carafate, Protonix, and Octreotide Principal Problem:   Septic shock Active Problems:  Depression   Protein-calorie malnutrition, severe   Acute GI bleeding   Hemorrhagic shock   Thrombocytopenia   Portal vein thrombosis   Sepsis   Acute respiratory failure with hypoxia   Hypokalemia   Hypomagnesemia   Sepsis associated hypotension   Bacteremia due to Klebsiella pneumoniae   Anemia due to acute blood loss     LOS: 18 days   Amy Esterwood  10/02/2014, 9:24 AM   GI Attending Note  I have personally taken an interval history, reviewed the chart, and examined the patient. No further overt GI bleeding.  Plan to advance diet.  Sandy Salaam. Deatra Ina, MD, [facility] [phone]

## 2014-10-02 NOTE — Progress Notes (Signed)
[facility] PROGRESS NOTE  Melinda Conrad KKX:[id number] DOB: Mar 20, 1951 DOA: 09/14/2014 PCP: Kevan Ny, MD  Brief History  64 y/o female with history of pancreatitis, hypertension, pancreatic cancer with prior Whipple surgery (08/07/14) with preoperative radiation and chemotherapy. Admit to ICU12/25/2015 hemoglobin was 5.8. with several days of melanotic stools, fever up to 104.52F, and hematemesis.    12/25 CT abd/pelvis showed two ovoid high-density lesions adjacent to the pancreatic stent within the jejunum could represent hemorrhage or hematoma. No additional gross evidence of surgical complication. Gastroenterology was consulted.the patient was placed on an IV Protonix drip.  Initial ?GI bleed was due to chronic NSAID use vs ischemia induced from her hypotension.  EGD was deferred because of the patient's persistent hypotension and clincial instability at the time of sentinel bleed   Blood cultures performed at the time of admission revealed Klebsiella pneumoniae.  The patient was initially started on Zosyn, but this was narrowed to ceftriaxone and subsequently d/c altoghter 1/8  CT abd/p for abd pain 09/16/2014 w=new portal vein thrombus, bilateral pleural effusions with bibasilar infiltrates .Gen. surgery was consulted. After careful consideration amongst all consultants due to the patient's recent GI bleed, the patient was started on a heparin drip.   The patient continued to have progressive thrombocytopenia. Heme/Onc, Dr. Benay Spice was consulted. The patient's heparin drip was subsequently held with the hopes that with her thrombocytopenia will improved with treatment for sepsis. Thrombocytopenia improving.   Unfortunately hemoglobin dropped 9-->6.7, patient complained of left flank pain not relieved by IV pain medications Persistently hypotensive over the course of 1/5-1/6 therefore transferred back to step down unit. Heparin completely discontinued because of new onset  anemia She devleoped 2 further epiosdes of N/V of BRB on 09/30/13 and Gen surgery and GI input were sought  Unclear etilogy of Abd pain- might represent extension of Portal venous thrombosis?  She has reached another plateau wherein she has stopped having any GI bleed as of 10/02/14, however now the ? Is when is it truly safe to feed her and whether she will recover completely from this situation?   A limited discussion was undertaken regarding what options we have moving forward and Surg-Onc and Gi are to weigh in if patient re-bleeds again and I have discussed with the patient that we will need to come to a decision about what to do if patient suffers from recurrent bleeding.   #1 severe sepsis Patient was admitted with sepsis felt to be secondary to her bacteremia. Patient improving clinically. Afebrile,  off pressors.  Was on IV Rocephin-->Zosyn as below CXR showed persisting PNA Await BC x 2 09/27/14  As PCT is 0.2, d/c IV abx 1/8  #2 Klebsiella pneumonia bacteremia Likely seeded from the GI source?  12/30 surveillance blood cultures in the setting of portal vein thromboses negative X5 days. CT scan abdomen pelvis 12/27 = bibasilar infiltrates right middle lobe-new from chest x-ray 12/25 Transition antibiotics from Rocephin->Zosyn 09/26/14 Follow BC x 2 1/7 [ Pending]  #3 hemorrhagic shock/acute blood loss anemia  from Staple line of Whipple on Endoscopy 1/8 + s/p Whipple T3N1   Endoscopy 1/8=Friable anastamosis with bleeding Rockall score~6-8 depending on how calculated  Re-bleed again 1/10-looks like mixed and old blood?  Patient status post 4 units packed red blood cells.  Transfused 1 unit packed red blood cells 1/6 Transfused  1 U PRBC again 09/27/14  Pantoprazole infusion 1/10-Stop date per GI/surgery Octreotide infusion 1/10-Stop date per GI/surgery  Advanced to clears per GI on  1/12  Rest as per GI and Gen surgery  #4 portal vein thrombosis-unclear if  propagating Anticoagulation held secondary to significant thrombocytopenia/bleeding Platelet count improving.  For now heparin/Lovenox have been completely held  #5 acute respiratory failure Concern for TRALI secondary to transfusions versus acute lung injury from bacteremia.  09/27/14 with crackles RLL Patients with some shortness of breath however states improving.  Patient Net +17 L since admission.  Hold 40 mg of IV Lasix on 12/29, 12/30, 12/31.   #6 thrombocytopenia 2/2 to DIC and massive ABLA-Nadir = PLT 21 on 09/17/14 Likely secondary to sepsis with possible DIC and consumption.  Current PLT count 126 Platelet function showing recovery and improving.  No schistocytes on peripheral smear. Patient may have had low-grade DIC in the setting of sepsis ?  #7 acute kidney injury Secondary to sepsis and pre-azotemia secondary to hypotension. Improved and currently at baseline. Monitor with diuresis. Resolved  #8 severe protein calorie malnutrition Patient has been seen by dietitian. TPN started 1/12 ALk phos elevated for unclear reasons-monitor in am  #9 pancreatic adenocarcinoma s/p Whipple Status post Whipple's with preoperative XRT and chemotherapy. Patient has been seen by Dr. Benay Spice of oncology and chemotherapy on hold for now. Outpatient follow-up with oncology.  #10 hypokalemia/hypomagnesemia Likely secondary to diuretics. Repleted.   #11 deconditioning/debility Patient has been seen by physical therapy recommended home health PT.  #12 prophylaxis Protonix for GI prophylaxis. SCDs for DVT prophylaxis.    Code Status: Full Family Communication: Updated patient and family present Disposition Plan: keep on SDU   Consultants:  Palliative care Dr. Hilma Favors 09/19/2014  Gastroenterology Dr. Deatra Ina 09/15/2014  Hematology/oncology Dr. Benay Spice: 09/17/2014  GI reconsulted 09/26/14 Dr. Fuller Plan  Procedures: R IJ CVL 12/25 >>  12/25 CT abd/pelvis: Two ovoid high-density  lesions adjacent to the pancreatic stent within the jejunum could represent hemorrhage or hematoma. No additional gross evidence of surgical complication on this non IV and oral contrast exam. Potential small additional clot within the stomach is indeterminate 09/16/2014 CT abdomen and pelvis:Changes consistent with prior Whipple procedure with pancreatic stent in place. Some postoperative free fluid is noted although no organized collection to suggest abscess is noted at this time.  New main portal vein thrombus which is nonocclusive but occupies a majority of the lumen of the main portal vein. There is some differential enhancement of the liver identified which in part is likely related to this thrombus and timing of the contrast bolus.  New bilateral pleural effusions with bibasilar infiltrates particularly in the right middle lobe. This may be the etiology of the patient's elevated white blood cell count. These are new from the prior exam from 09/14/2014.  Changes of anasarca.  12/25 Admitted with UGIB to ICU. 2 units PRBCs ordered Chest x-ray 09/14/2014, 09/17/2014, 09/19/2014, 09/21/2014 KUB 09/16/2014 2-D echo 09/21/2014 Endoscopy 1/8 tx 2 u PRBC 1/6, 1/7 Pantoprazole Gtt 1/10 Sandostatin 1/10  Antibiotics: Ceftriaxone 12/25 >> 12/26 Zosyn 12/26 >>12/28 Rocephin 12/28>>> 09/26/14 Zosyn 1/6---09/28/14  HPI/Subjective:  No abd pain No bleed Looks happy Feeling good overall Brown stool last pm NO n/v  nursing reports bp on higher side of nl  Objective: Filed Vitals:   10/02/14 1300  BP: 104/57  Pulse: 46  Temp:   Resp: 13    Intake/Output Summary (Last 24 hours) at 10/02/14 1351 Last data filed at 10/02/14 0600  Gross per 24 hour  Intake   2500 ml  Output    750 ml  Net   1750 ml  Filed Weights   09/30/14 0500 10/01/14 0300 10/02/14 0600  Weight: 60.4 kg (133 lb 2.5 oz) 62.3 kg (137 lb 5.6 oz) 66 kg (145 lb 8.1 oz)    Exam:   General:  Cachectic,  oriented in nad  Cardiovascular: RRR  Respiratory: Crackles in the R lower lobes  Abdomen: Soft, non tender   Data Reviewed: Basic Metabolic Panel:  Recent Labs Lab 09/27/14 0144 09/27/14 1215 09/28/14 0500 09/29/14 0431 10/02/14 1015  NA 135 137 136 136 138  K 3.2* 3.8 3.6 3.4* 3.5  CL 117* 116* 111 113* 116*  CO2 '21 19 19 20 19  ' GLUCOSE 63* 101* 65* 64* 125*  BUN '7 6 6 6 9  ' CREATININE 0.51 0.57 0.64 0.57 0.59  CALCIUM 6.7* 7.1* 7.2* 7.0* 7.0*  MG  --   --   --   --  1.4*  PHOS  --   --   --  3.2 2.6   Liver Function Tests:  Recent Labs Lab 09/27/14 0144 09/27/14 1215 09/28/14 0500 09/29/14 0431 10/02/14 1015  AST '20 25 24  ' --  20  ALT '13 14 14  ' --  15  ALKPHOS 265* 240* 269*  --  488*  BILITOT 0.4 0.7 0.6  --  0.7  PROT 5.3* 5.3* 5.4*  --  5.4*  ALBUMIN 1.2* 1.1* 1.1* <1.0* 1.0*   No results for input(s): LIPASE, AMYLASE in the last 168 hours. No results for input(s): AMMONIA in the last 168 hours. CBC:  Recent Labs Lab 09/27/14 0144 09/27/14 1215 09/28/14 0500 09/29/14 0431 09/30/14 0515 09/30/14 1245 09/30/14 1740 10/01/14 0028  WBC 4.9 5.9 5.5 4.4 5.0 6.0 9.0  --   NEUTROABS 3.2 4.2  --  2.6 3.3  --  7.3  --   HGB 7.6* 9.9* 9.6* 8.9* 8.8* 8.8* 7.7* 9.3*  HCT 23.5* 30.7* 29.1* 26.9* 26.5* 26.6* 23.6* 27.8*  MCV 90.0 91.1 88.7 90.0 89.8 89.9 90.1  --   PLT 118* 139* 158 148* 134* 123* 126*  --    Cardiac Enzymes:  Recent Labs Lab 09/27/14 1215 09/27/14 1830 09/27/14 2151  TROPONINI 0.04* 0.03 0.03   BNP (last 3 results) No results for input(s): PROBNP in the last 8760 hours. CBG:  Recent Labs Lab 10/01/14 0814 10/01/14 1150 10/01/14 1637 10/01/14 2006 10/02/14 0311  GLUCAP 107* 140* 155* 145* 129*    Recent Results (from the past 240 hour(s))  Culture, blood (routine x 2)     Status: None (Preliminary result)   Collection Time: 09/27/14 12:40 PM  Result Value Ref Range Status   Specimen Description BLOOD RIGHT HAND   Final   Special Requests BOTTLES DRAWN AEROBIC ONLY 1CC  Final   Culture   Final           BLOOD CULTURE RECEIVED NO GROWTH TO DATE CULTURE WILL BE HELD FOR 5 DAYS BEFORE ISSUING A FINAL NEGATIVE REPORT Performed at [facility]    Report Status PENDING  Incomplete  Culture, blood (routine x 2)     Status: None (Preliminary result)   Collection Time: 09/27/14 12:45 PM  Result Value Ref Range Status   Specimen Description BLOOD EFT ARM  Final   Special Requests BOTTLES DRAWN AEROBIC AND ANAEROBIC 3CC  Final   Culture   Final           BLOOD CULTURE RECEIVED NO GROWTH TO DATE CULTURE WILL BE HELD FOR 5 DAYS BEFORE ISSUING A FINAL NEGATIVE REPORT Performed  at [facility]    Report Status PENDING  Incomplete     Studies: Dg Chest 1 View  10/02/2014   CLINICAL DATA:  65 year old follow-up pleural effusion  EXAM: CHEST - 1 VIEW  COMPARISON:  09/29/2014  FINDINGS: A right-sided PICC line catheter tip projects over the region of the cavoatrial junction.  The cardiac silhouette and mediastinal contours are unchanged.  There has been interval development of increased patchy areas of consolidation, predominantly involving the left lung base. There is no large pleural effusion. There is no pneumothorax. Interstitial prominence is present.  The osseous structures are unchanged.  IMPRESSION: 1. Findings remain compatible with mildly increased interstitial edema. 2. Bibasilar consolidation, left greater than right, question developing pneumonia versus atelectasis.   Electronically Signed   By: Rosemarie Ax   On: 10/02/2014 11:34    Scheduled Meds: . antiseptic oral rinse  7 mL Mouth Rinse BID  . feeding supplement (ENSURE COMPLETE)  237 mL Oral TID BM  . feeding supplement (ENSURE)  1 Container Oral TID BM  . fentaNYL  25 mcg Transdermal Q72H  . insulin aspart  0-9 Units Subcutaneous 4 times per day  . lidocaine  1 patch Transdermal Q24H  . [START ON 10/04/2014] pantoprazole  (PROTONIX) IV  40 mg Intravenous Q12H  . potassium chloride  40 mEq Oral Daily  . potassium phosphate IVPB (mmol)  10 mmol Intravenous Once  . pregabalin  25 mg Oral QHS  . sertraline  100 mg Oral q morning - 10a  . sodium chloride  10-40 mL Intracatheter Q12H  . sucralfate  1 g Oral TID WC & HS   Continuous Infusions: . dextrose 5 % and 0.9% NaCl 1,000 mL infusion 50 mL/hr at 10/02/14 1030  . dextrose 5 % and 0.9% NaCl 1,000 mL infusion    . Marland KitchenTPN (CLINIMIX-E) Adult     And  . fat emulsion    . octreotide  (SANDOSTATIN)    IV infusion 50 mcg/hr (10/02/14 0903)  . pantoprozole (PROTONIX) infusion 8 mg/hr (10/02/14 0903)    Principal Problem:   Septic shock Active Problems:   Depression   Protein-calorie malnutrition, severe   Acute GI bleeding   Hemorrhagic shock   Thrombocytopenia   Portal vein thrombosis   Sepsis   Acute respiratory failure with hypoxia   Hypokalemia   Hypomagnesemia   Sepsis associated hypotension   Bacteremia due to Klebsiella pneumoniae   Anemia due to acute blood loss   Verneita Griffes, MD [facility] (P) [phone]

## 2014-10-02 NOTE — Progress Notes (Signed)
Nutrition Note/Consult  RD consulted regarding initiation of TPN.  Pt was followed-up on 1/11 by another RD. Agree with estimated needs provided in that note.  Labs and medications reviewed.   RD to continue to monitor.  Clayton Bibles, MS, RD, LDN Pager: [phone] After Hours Pager: [phone]

## 2014-10-03 LAB — COMPREHENSIVE METABOLIC PANEL
ALBUMIN: 1.1 g/dL — AB (ref 3.5–5.2)
ALT: 14 U/L (ref 0–35)
ANION GAP: 2 — AB (ref 5–15)
AST: 20 U/L (ref 0–37)
Alkaline Phosphatase: 470 U/L — ABNORMAL HIGH (ref 39–117)
BILIRUBIN TOTAL: 0.5 mg/dL (ref 0.3–1.2)
BUN: 7 mg/dL (ref 6–23)
CHLORIDE: 114 meq/L — AB (ref 96–112)
CO2: 19 mmol/L (ref 19–32)
Calcium: 7.2 mg/dL — ABNORMAL LOW (ref 8.4–10.5)
Creatinine, Ser: 0.57 mg/dL (ref 0.50–1.10)
GFR calc Af Amer: >90 mL/min (ref >90)
GFR calc non Af Amer: >90 mL/min (ref >90)
Glucose, Bld: 140 mg/dL — ABNORMAL HIGH (ref 70–99)
POTASSIUM: 3.7 mmol/L (ref 3.5–5.1)
Sodium: 135 mmol/L (ref 135–145)
TOTAL PROTEIN: 5.9 g/dL — AB (ref 6.0–8.3)

## 2014-10-03 LAB — CBC WITH DIFFERENTIAL/PLATELET
BASOS ABS: 0 10*3/uL (ref 0.0–0.1)
Basophils Relative: 0 % (ref 0–1)
Eosinophils Absolute: 0.1 10*3/uL (ref 0.0–0.7)
Eosinophils Relative: 2 % (ref 0–5)
HCT: 29 % — ABNORMAL LOW (ref 36.0–46.0)
Hemoglobin: 9.4 g/dL — ABNORMAL LOW (ref 12.0–15.0)
LYMPHS ABS: 1.2 10*3/uL (ref 0.7–4.0)
LYMPHS PCT: 21 % (ref 12–46)
MCH: 29.2 pg (ref 26.0–34.0)
MCHC: 32.4 g/dL (ref 30.0–36.0)
MCV: 90.1 fL (ref 78.0–100.0)
Monocytes Absolute: 0.6 10*3/uL (ref 0.1–1.0)
Monocytes Relative: 11 % (ref 3–12)
Neutro Abs: 3.7 10*3/uL (ref 1.7–7.7)
Neutrophils Relative %: 66 % (ref 43–77)
PLATELETS: 99 10*3/uL — AB (ref 150–400)
RBC: 3.22 MIL/uL — AB (ref 3.87–5.11)
RDW: 16.6 % — ABNORMAL HIGH (ref 11.5–15.5)
WBC: 5.5 10*3/uL (ref 4.0–10.5)

## 2014-10-03 LAB — CULTURE, BLOOD (ROUTINE X 2)
CULTURE: NO GROWTH
Culture: NO GROWTH

## 2014-10-03 LAB — GLUCOSE, CAPILLARY
GLUCOSE-CAPILLARY: 136 mg/dL — AB (ref 70–99)
Glucose-Capillary: 146 mg/dL — ABNORMAL HIGH (ref 70–99)
Glucose-Capillary: 141 mg/dL — ABNORMAL HIGH (ref 70–99)
Glucose-Capillary: 115 mg/dL — ABNORMAL HIGH (ref 70–99)

## 2014-10-03 LAB — PHOSPHORUS: Phosphorus: 2.9 mg/dL (ref 2.3–4.6)

## 2014-10-03 LAB — PROTIME-INR
INR: 1.53 — ABNORMAL HIGH (ref 0.00–1.49)
Prothrombin Time: 18.5 seconds — ABNORMAL HIGH (ref 11.6–15.2)

## 2014-10-03 LAB — PREALBUMIN: Prealbumin: <3 mg/dL — ABNORMAL LOW (ref 17.0–34.0)

## 2014-10-03 LAB — TRIGLYCERIDES: Triglycerides: 98 mg/dL (ref <150)

## 2014-10-03 LAB — MAGNESIUM: Magnesium: 1.6 mg/dL (ref 1.5–2.5)

## 2014-10-03 MED ORDER — POTASSIUM CHLORIDE CRYS ER 20 MEQ PO TBCR
20.0000 meq | EXTENDED_RELEASE_TABLET | Freq: Two times a day (BID) | ORAL | Status: DC
  Administered 2014-10-03 – 2014-10-09 (×12): 20 meq via ORAL
  Filled 2014-10-03 (×15): qty 1

## 2014-10-03 MED ORDER — HYDRALAZINE HCL 20 MG/ML IJ SOLN: 10.0000 mg | INTRAMUSCULAR | Status: DC | PRN

## 2014-10-03 MED ORDER — FUROSEMIDE 40 MG PO TABS
40.0000 mg | ORAL_TABLET | Freq: Every day | ORAL | Status: DC
  Administered 2014-10-03: 40 mg via ORAL
  Filled 2014-10-03: qty 1

## 2014-10-03 MED ORDER — PANTOPRAZOLE SODIUM 40 MG IV SOLR
40.0000 mg | Freq: Two times a day (BID) | INTRAVENOUS | Status: DC
  Administered 2014-10-04 – 2014-10-09 (×12): 40 mg via INTRAVENOUS
  Filled 2014-10-03 (×13): qty 40

## 2014-10-03 MED ORDER — MORPHINE SULFATE 4 MG/ML IJ SOLN
4.0000 mg | INTRAMUSCULAR | Status: DC | PRN
  Administered 2014-10-03 – 2014-10-09 (×17): 4 mg via INTRAVENOUS
  Filled 2014-10-03 (×17): qty 1

## 2014-10-03 MED ORDER — FAT EMULSION 20 % IV EMUL
240.0000 mL | INTRAVENOUS | Status: AC
  Administered 2014-10-03: 240 mL via INTRAVENOUS
  Filled 2014-10-03: qty 250

## 2014-10-03 MED ORDER — TRACE MINERALS CR-CU-F-FE-I-MN-MO-SE-ZN IV SOLN
INTRAVENOUS | Status: AC
  Administered 2014-10-03: 18:00:00 via INTRAVENOUS
  Filled 2014-10-03: qty 1440

## 2014-10-03 MED ORDER — MAGNESIUM SULFATE 2 GM/50ML IV SOLN
2.0000 g | Freq: Once | INTRAVENOUS | Status: AC
  Administered 2014-10-03: 2 g via INTRAVENOUS
  Filled 2014-10-03: qty 50

## 2014-10-03 NOTE — Progress Notes (Signed)
Patient ID: Melinda Conrad, female   DOB: Dec 28, 1950, 64 y.o.   MRN: [id number] 5 Days Post-Op  Subjective: No emesis yesterday.  No hypotension.  No evidence of bleeding.    Objective: Vital signs in last 24 hours: Temp:  [98.3 F (36.8 C)-98.9 F (37.2 C)] 98.4 F (36.9 C) (01/13 0527) Pulse Rate:  [46-76] 76 (01/13 0800) Resp:  [13-22] 18 (01/13 0800) BP: (104-180)/(57-95) 180/95 mmHg (01/13 0800) SpO2:  [94 %-99 %] 94 % (01/13 0800) Weight:  [147 lb 4.3 oz (66.8 kg)] 147 lb 4.3 oz (66.8 kg) (01/13 0000) Last BM Date: 10/02/14  Intake/Output from previous day: 01/12 0701 - 01/13 0700 In: 2948.5 [P.O.:120; I.V.:1970; IV Piggyback:253.3; TPN:605.2] Out: -  Intake/Output this shift:    Resp: breathing comfortably Cardio: regular rate and rhythm GI: soft, nontender  Lab Results:   Recent Labs  10/02/14 1640 10/03/14 0455  WBC 5.1 5.5  HGB 9.1* 9.4*  HCT 27.8* 29.0*  PLT 97* 99*   BMET  Recent Labs  10/02/14 1015 10/03/14 0455  NA 138 135  K 3.5 3.7  CL 116* 114*  CO2 19 19  GLUCOSE 125* 140*  BUN 9 7  CREATININE 0.59 0.57  CALCIUM 7.0* 7.2*   PT/INR  Recent Labs  10/02/14 1640 10/03/14 0455  LABPROT 18.3* 18.5*  INR 1.51* 1.53*   ABG No results for input(s): PHART, HCO3 in the last 72 hours.  Invalid input(s): PCO2, PO2  Studies/Results: Dg Chest 1 View  10/02/2014   CLINICAL DATA:  64 year old follow-up pleural effusion  EXAM: CHEST - 1 VIEW  COMPARISON:  09/29/2014  FINDINGS: A right-sided PICC line catheter tip projects over the region of the cavoatrial junction.  The cardiac silhouette and mediastinal contours are unchanged.  There has been interval development of increased patchy areas of consolidation, predominantly involving the left lung base. There is no large pleural effusion. There is no pneumothorax. Interstitial prominence is present.  The osseous structures are unchanged.  IMPRESSION: 1. Findings remain compatible with mildly  increased interstitial edema. 2. Bibasilar consolidation, left greater than right, question developing pneumonia versus atelectasis.   Electronically Signed   By: Rosemarie Ax   On: 10/02/2014 11:34    Anti-infectives: Anti-infectives    Start     Dose/Rate Route Frequency Ordered Stop   09/26/14 1400  piperacillin-tazobactam (ZOSYN) IVPB 3.375 g  Status:  Discontinued     3.375 g12.5 mL/hr over 240 Minutes Intravenous 3 times per day 09/26/14 1342 09/28/14 1649   09/17/14 1200  cefTRIAXone (ROCEPHIN) 2 g in dextrose 5 % 50 mL IVPB - Premix  Status:  Discontinued     2 g100 mL/hr over 30 Minutes Intravenous Every 24 hours 09/17/14 1013 09/26/14 1315   09/15/14 1800  piperacillin-tazobactam (ZOSYN) IVPB 3.375 g  Status:  Discontinued     3.375 g12.5 mL/hr over 240 Minutes Intravenous Every 8 hours 09/15/14 1741 09/17/14 1013   09/14/14 2245  cefTRIAXone (ROCEPHIN) 1 g in dextrose 5 % 50 mL IVPB  Status:  Discontinued     1 g100 mL/hr over 30 Minutes Intravenous Every 24 hours 09/14/14 2238 09/15/14 1730      Assessment/Plan: s/p Procedure(s): ESOPHAGOGASTRODUODENOSCOPY (EGD) (N/A) Soft diet after stable.  TNA Monitor blood sugar Carafate.  Continue IV protonix and octreotide.       LOS: 19 days    Rigoberto Repass 10/03/2014

## 2014-10-03 NOTE — Progress Notes (Addendum)
[place] NOTE   Pharmacy Consult for TPN Indication: Severe protein calorie malnutrion  No Known Allergies  Patient Measurements: Height: _0  (160 cm) Weight: 147 lb 4.3 oz (66.8 kg) IBW/kg (Calculated) : 52.4 Adjusted Body Weight: 56.5 kg  Vital Signs: Temp: 98.4 F (36.9 C) (01/13 0800) Temp Source: Oral (01/13 0800) BP: 132/68 mmHg (01/13 1000) Pulse Rate: 62 (01/13 1000) Intake/Output from previous day: 01/12 0701 - 01/13 0700 In: 3033.5 [P.O.:120; I.V.:2005; IV Piggyback:253.3; TPN:655.2] Out: -  Intake/Output from this shift: Total I/O In: 330 [P.O.:75; I.V.:105; TPN:150] Out: 300 [Urine:300]  Labs:  Recent Labs  09/30/14 1540  09/30/14 1740 10/01/14 0028 10/02/14 1640 10/03/14 0455  WBC  --   --  9.0  --  5.1 5.5  HGB  --   < > 7.7* 9.3* 9.1* 9.4*  HCT  --   < > 23.6* 27.8* 27.8* 29.0*  PLT  --   --  126*  --  97* 99*  APTT 41*  --   --   --   --   --   INR 1.51*  --   --   --  1.51* 1.53*  < > = values in this interval not displayed.   Recent Labs  10/02/14 1015 10/03/14 0455  NA 138 135  K 3.5 3.7  CL 116* 114*  CO2 19 19  GLUCOSE 125* 140*  BUN 9 7  CREATININE 0.59 0.57  CALCIUM 7.0* 7.2*  MG 1.4* 1.6  PHOS 2.6 2.9  PROT 5.4* 5.9*  ALBUMIN 1.0* 1.1*  AST 20 20  ALT 15 14  ALKPHOS 488* 470*  BILITOT 0.7 0.5  TRIG  --  98   Estimated Creatinine Clearance: 67.8 mL/min (by C-G formula based on Cr of 0.57).    Recent Labs  10/02/14 1650 10/02/14 2327 10/03/14 0615  GLUCAP 104* 146* 141*    Medical History: Past Medical History  Diagnosis Date  . Arthritis   . Depression   . H/O: substance abuse   . Back pain   . Personal history of colonic polyp - adenoma 10/26/2013    10/26/2013 diminutive sigmoid polyp removed    . Pancreatitis, acute   . Hypertension   . Osteoporosis   . Radiation 04/30/14-06/08/14    pancreas 50 gray  . Cancer     pancreatic cancer   Insulin Requirements: 1 unit Novolog sensitive  SSI  Current Nutrition: clear liquids, order ensure complete and ensure pudding TID between meals  IVF: D5NS at [facility] access: PICC TPN start date: 10/02/14  ASSESSMENT                                                                                                          HPI:  34 y/oF with PMH of pancreatitis, HTN, pancreatic cancer with prior Whipple surgery (08/07/14) with preoperative radiation and chemotherapy admitted to ICU on 09/14/2014 with Hgb 5.8, several days of melanotic stools, fever, hematemesis, sepsis secondary to bacteremia.  Patient is s/p EGD on 1/8 with friable areas at  anastomosis.  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  Pharmacy consulted today to start TPN for this patient.    Significant events:   Today:    Glucose - CBGs at goal <12m/dl  Electrolytes - K, Phos improved following replacement, Mag low low end goal, Corrected Ca WNL  Renal -SCr WNL, stable  LFTs - AST/ALT WNL, Alk Phos elevated at 488  TGs - 98 (1/13)  Prealbumin -pending  NUTRITIONAL GOALS                                                                 RD recs: Kcal:1600-1800 Protein: 90-100 gram Fluid: >/= 1600 ml daily Clinimix 5/15 at a goal rate of 831mhr + 20% fat emulsion MWF at 1045mr to provide: 96g/day protein, avg 1569Kcal/day.  PLAN  ______________________________________________  At 1800 today:  Increase Clinimix E 5/15 to 60 ml/hr.  20% fat emulsion at 10 ml/hr on MWF  Fat emulsion only being given on MWF d/t national backorder  Plan to advance as tolerated to the goal rate.  TPN to contain standard multivitamins and trace elements.  Continue sensitive SSI q6h..   TPN lab panels on Mondays & Thursdays.  magnesium 2gm IVPB x 1 deose  F/u daily.   DusDoreene ElandharmD, BCPS.   Pager: [phone] 12:52 PM

## 2014-10-03 NOTE — Progress Notes (Signed)
Progress Note   Melinda Conrad BWL:[id number] DOB: 22-Jun-1951 DOA: 09/14/2014 PCP: Kevan Ny, MD   Brief Narrative:   Melinda Conrad is an 64 y.o. female with history of pancreatitis, hypertension, pancreatic cancer with prior Whipple surgery (08/07/14) with preoperative radiation and chemotherapy. Admitted to ICU12/25/2015 with a hemoglobin of 5.8. And a complaint of several days of melanotic stools, fever up to 104.20F, and hematemesis.   09/14/14 CT abd/pelvis showed two ovoid high-density lesions adjacent to the pancreatic stent within the jejunum could represent hemorrhage or hematoma. No additional gross evidence of surgical complication. Gastroenterology was consulted.the patient was placed on an IV Protonix drip. Initial ?GI bleed was due to chronic NSAID use vs ischemia induced from her hypotension. EGD was deferred because of the patient's persistent hypotension and clincial instability at the time of sentinel bleed.  Blood cultures performed at the time of admission revealed Klebsiella pneumoniae. The patient was initially started on Zosyn, but this was narrowed to ceftriaxone and subsequently d/c altoghter 09/28/14.  CT abd/pelvis for abd pain 09/16/2014 with new portal vein thrombus, bilateral pleural effusions with bibasilar infiltrates .Gen. surgery was consulted. After careful consideration amongst all consultants due to the patient's recent GI bleed, the patient was started on a heparin drip.   The patient continued to have progressive thrombocytopenia. Heme/Onc, Dr. Benay Spice was consulted. The patient's heparin drip was subsequently held with the hopes that with her thrombocytopenia will improved with treatment for sepsis. Thrombocytopenia improving.   Unfortunately hemoglobin dropped 9-->6.7, patient complained of left flank pain not relieved by IV pain medications. Persistently hypotensive over the course of 1/5-1/6 therefore transferred back to step down unit.  Heparin completely discontinued because of new onset anemia She devleoped 2 further epiosdes of N/V of BRB on 09/30/13 and Gen surgery and GI input were sought.  Unclear etilogy of Abd pain- might represent extension of Portal venous thrombosis?  She has reached another plateau wherein she has stopped having any GI bleed as of 10/02/14, however now the ? Is when is it truly safe to feed her and whether she will recover completely from this situation?   A limited discussion was undertaken regarding what options we have moving forward and Surg-Onc and GI are to weigh in if patient re-bleeds again and I have discussed with the patient that we will need to come to a decision about what to do if patient suffers from recurrent bleeding.  Assessment/Plan:   Principal problem: Severe sepsis/Klebsiella pneumonia bacteremia  Patient was admitted with sepsis with Klebsiella bacteremia. Patient improving clinically. Afebrile,off pressors.   Was on IV Rocephin-->Zosyn as below.  CXR showed persisting PNA.  Await BC x 2 09/27/14, negative to date.   As PCT is 0.2, IV antibiotics were discontinued on 09/28/14.  Klebsiella bacteremia possibly seeded from the GI source?   09/19/14 surveillance blood cultures in the setting of portal vein thromboses negative X5 days.  CT scan abdomen pelvis 09/16/14 = bibasilar infiltrates right middle lobe-new from chest x-ray 09/14/14.  Transition antibiotics from Rocephin->Zosyn 09/26/14.  Follow BC x 2 1/7 [ Pending]  Active problems:  Hemorrhagic shock/acute blood loss anemia from Staple line of Whipple on Endoscopy 1/8 + s/p Whipple T3N1   Endoscopy 1/8=Friable anastamosis with bleeding.  Re-bled again 09/30/14.  Patient status post 4 units packed red blood cells, with last transfusion given 09/27/14..  Pantoprazole infusion initiated 09/30/14-stopped today.  Octreotide infusion initiated 09/30/14-stopped today.  Advanced to clears per GI on 10/02/14.  Tolerating so far.  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  Platelet count improving. For now heparin/Lovenox have been completely held.  Acute respiratory failure  Concern for TRALI secondary to transfusions versus acute lung injury from bacteremia.   Patient Net +17 L since admission. Gently diurese.  Respiratory status stable, mild dyspnea at times.  Thrombocytopenia secondary to DIC and massive ABLA-Nadir = PLT 21 on 09/17/14  Likely secondary to sepsis with possible DIC and consumption.   Current PLT count stable. Platelet function showing recovery and improving.   No schistocytes on peripheral smear. Patient may have had low-grade DIC in the setting of sepsis ?  Acute kidney injury  Secondary to sepsis and pre-azotemia secondary to hypotension. Improved and currently at baseline.   Severe protein calorie malnutrition  Patient has been seen by dietitian.  TPN started 1/12.  ALk phos elevated for unclear reasons-monitoring.  Pancreatic adenocarcinoma s/p Whipple  Status post Whipple's with preoperative XRT and chemotherapy.   Patient has been seen by Dr. Benay Spice of oncology and chemotherapy on hold for now. Outpatient follow-up with oncology.  Hypokalemia/hypomagnesemia  Likely secondary to diuretics. Repleted.   Deconditioning/debility  Patient has been seen by physical therapy recommended home health PT.  Prophylaxis  Protonix for GI prophylaxis. SCDs for DVT prophylaxis.  Code Status: Full. Family Communication: No family at the bedside. Disposition Plan: Home when stable.   IV Access:    PICC placed 09/29/14   Procedures and diagnostic studies:   Dg Chest Port 1 View 09/14/2014: 1. Right IJ line noted ending about the mid SVC. 2. Mild vascular congestion noted. Increased interstitial markings may reflect minimal interstitial edema.    Ct Abdomen Pelvis Wo  Contrast 09/14/2014: 1. Two ovoid high-density lesions adjacent to the pancreatic stent within the jejunum could represent hemorrhage or hematoma. 2. No additional gross evidence of surgical complication on this non IV and oral contrast exam. 3. Potential small additional clot within the stomach is indeterminate.  Dg Abd Portable 2v 09/16/2014: Negative.     Ct Abdomen Pelvis W Contrast 09/16/2014: Changes consistent with prior Whipple procedure with pancreatic stent in place. Some postoperative free fluid is noted although no organized collection to suggest abscess is noted at this time.  New main portal vein thrombus which is nonocclusive but occupies a majority of the lumen of the main portal vein. There is some differential enhancement of the liver identified which in part is likely related to this thrombus and timing of the contrast bolus.  New bilateral pleural effusions with bibasilar infiltrates particularly in the right middle lobe. This may be the etiology of the patient's elevated white blood cell count. These are new from the prior exam from 09/14/2014.  Changes of anasarca.    Dg Chest Port 1 View 09/17/2014: 1. Interval development of widespread interstitial and airspace disease throughout the lungs bilaterally concerning for severe multilobar pneumonia.    Dg Chest Port 1 View 09/19/2014: 1. Right IJ line in stable position. 2. Severe dense diffuse bilateral pulmonary infiltrates, no change from prior exam .    Dg Chest Port 1 View 09/21/2014: Severe diffuse bilateral airspace disease, unchanged.     Dg Chest Port 1 View 09/22/2014: Slightly improved aeration bilaterally. Persistent interstitial pattern of infiltrates bilaterally.     Ct Abdomen Pelvis W Contrast 09/26/2014: 1. Again noted nonobstructive small amount of thrombus in main portal vein without change from prior exam. 2. Stable postsurgical changes post Whipple procedure.  Again noted pancreatic duct stent in place. Stable  heterogeneous pattern of enhancement of the liver 3. Again noted small amount of perihepatic and perisplenic ascites. Small amount of free fluid is noted in right paracolic gutter. Anasarca infiltration subcutaneous fat abdominal and pelvic wall again noted. 4. No small bowel or colonic obstruction. Moderate stool noted in right colon and sigmoid colon. No pericecal inflammation. Normal appendix. 5. Mild distended urinary bladder. 6. No hydronephrosis or hydroureter. Bilateral renal symmetrical excretion.    Dg Chest 2 View 09/28/2014: Slight improvement in mixed interstitial and airspace disease, possibly due to edema, with small bilateral effusions.    EGD 09/28/14: 1. Prior Whipple surgery with gastroenterostomy in the gastric body 2. Fiable areas at the anastomosis 3. The EGD otherwise appeared normal  Dg Chest Port 1 View 09/29/2014: 1. Right PICC line in good position. 2. Interstitial edema pattern.    2-D echocardiogram 4/35/68: Normal systolic function with an EF 55-60 percent. No regional wall motion abnormalities.  Dg Chest 1 View 10/02/2014: 1. Findings remain compatible with mildly increased interstitial edema. 2. Bibasilar consolidation, left greater than right, question developing pneumonia versus atelectasis.     Medical Consultants:    Dr. Erskine Emery, Gastroenterology   Dr. Stark Klein, Surgery  Dr. Lane Hacker, Palliative Care  Dr. Baltazar Apo, Critical Care  Anti-Infectives:    Ceftriaxone 12/25 >> 12/26  Zosyn 12/26 >>12/28  Rocephin 12/28>>> 09/26/14  Zosyn 1/6---09/28/14  Subjective:   Melinda Conrad Tells me she has 6/10 stomach pain but no nausea or vomiting. She is tolerating a liquid diet, but her appetite remains poor. Her bowels moved yesterday. She does endorse some mild shortness of breath but no cough.  Objective:    Filed Vitals:   10/03/14 0300 10/03/14 0400 10/03/14 0500 10/03/14 0527  BP: 154/76 153/75 171/75   Pulse: 57 55 58   Temp:     98.4 F (36.9 C)  TempSrc:    Oral  Resp: '18 17 19   ' Height:      Weight:      SpO2: 96% 97% 96%     Intake/Output Summary (Last 24 hours) at 10/03/14 0721 Last data filed at 10/03/14 0600  Gross per 24 hour  Intake 2948.5 ml  Output      0 ml  Net 2948.5 ml    Exam: Gen:  NAD Cardiovascular:  RRR, No M/R/G Respiratory:  Lungs CTAB Gastrointestinal:  Abdomen soft, NT/ND, + BS Extremities:  2+ edema   Data Reviewed:    Labs: Basic Metabolic Panel:  Recent Labs Lab 09/27/14 1215 09/28/14 0500 09/29/14 0431 10/02/14 1015 10/03/14 0455  NA 137 136 136 138 135  K 3.8 3.6 3.4* 3.5 3.7  CL 116* 111 113* 116* 114*  CO2 '19 19 20 19 19  ' GLUCOSE 101* 65* 64* 125* 140*  BUN '6 6 6 9 7  ' CREATININE 0.57 0.64 0.57 0.59 0.57  CALCIUM 7.1* 7.2* 7.0* 7.0* 7.2*  MG  --   --   --  1.4* 1.6  PHOS  --   --  3.2 2.6 2.9   GFR Estimated Creatinine Clearance: 67.8 mL/min (by C-G formula based on Cr of 0.57). Liver Function Tests:  Recent Labs Lab 09/27/14 0144 09/27/14 1215 09/28/14 0500 09/29/14 0431 10/02/14 1015 10/03/14 0455  AST '20 25 24  ' --  20 20  ALT '13 14 14  ' --  15 14  ALKPHOS 265* 240* 269*  --  488* 470*  BILITOT  0.4 0.7 0.6  --  0.7 0.5  PROT 5.3* 5.3* 5.4*  --  5.4* 5.9*  ALBUMIN 1.2* 1.1* 1.1* <1.0* 1.0* 1.1*   Coagulation profile  Recent Labs Lab 09/27/14 0144 09/30/14 1540 10/02/14 1640 10/03/14 0455  INR 1.37 1.51* 1.51* 1.53*    CBC:  Recent Labs Lab 09/29/14 0431 09/30/14 0515 09/30/14 1245 09/30/14 1740 10/01/14 0028 10/02/14 1640 10/03/14 0455  WBC 4.4 5.0 6.0 9.0  --  5.1 5.5  NEUTROABS 2.6 3.3  --  7.3  --  3.4 3.7  HGB 8.9* 8.8* 8.8* 7.7* 9.3* 9.1* 9.4*  HCT 26.9* 26.5* 26.6* 23.6* 27.8* 27.8* 29.0*  MCV 90.0 89.8 89.9 90.1  --  90.3 90.1  PLT 148* 134* 123* 126*  --  97* 99*   Cardiac Enzymes:  Recent Labs Lab 09/27/14 1215 09/27/14 1830 09/27/14 2151  TROPONINI 0.04* 0.03 0.03   CBG: Sepsis Labs:  Recent  Labs Lab 09/27/14 1215 09/27/14 1451 09/28/14 0500 09/29/14 0431  09/30/14 1245 09/30/14 1740 10/02/14 1640 10/03/14 0455  PROCALCITON  --  0.20 0.20 0.12  --   --   --   --   --   WBC 5.9  --  5.5 4.4  < > 6.0 9.0 5.1 5.5  LATICACIDVEN 2.2  --   --   --   --   --   --   --   --   < > = values in this interval not displayed. Microbiology Recent Results (from the past 240 hour(s))  Culture, blood (routine x 2)     Status: None (Preliminary result)   Collection Time: 09/27/14 12:40 PM  Result Value Ref Range Status   Specimen Description BLOOD RIGHT HAND  Final   Special Requests BOTTLES DRAWN AEROBIC ONLY 1CC  Final   Culture   Final           BLOOD CULTURE RECEIVED NO GROWTH TO DATE CULTURE WILL BE HELD FOR 5 DAYS BEFORE ISSUING A FINAL NEGATIVE REPORT Performed at [facility]    Report Status PENDING  Incomplete  Culture, blood (routine x 2)     Status: None (Preliminary result)   Collection Time: 09/27/14 12:45 PM  Result Value Ref Range Status   Specimen Description BLOOD EFT ARM  Final   Special Requests BOTTLES DRAWN AEROBIC AND ANAEROBIC 3CC  Final   Culture   Final           BLOOD CULTURE RECEIVED NO GROWTH TO DATE CULTURE WILL BE HELD FOR 5 DAYS BEFORE ISSUING A FINAL NEGATIVE REPORT Performed at [facility]    Report Status PENDING  Incomplete     Medications:   . antiseptic oral rinse  7 mL Mouth Rinse BID  . feeding supplement (ENSURE COMPLETE)  237 mL Oral TID BM  . feeding supplement (ENSURE)  1 Container Oral TID BM  . fentaNYL  25 mcg Transdermal Q72H  . insulin aspart  0-9 Units Subcutaneous 4 times per day  . lidocaine  1 patch Transdermal Q24H  . [START ON 10/04/2014] pantoprazole (PROTONIX) IV  40 mg Intravenous Q12H  . potassium chloride  40 mEq Oral Daily  . pregabalin  25 mg Oral QHS  . sertraline  100 mg Oral q morning - 10a  . sodium chloride  10-40 mL Intracatheter Q12H  . sucralfate  1 g Oral TID WC & HS   Continuous  Infusions: . dextrose 5 % and 0.9% NaCl 1,000 mL infusion  10 mL/hr at 10/03/14 0600  . Marland KitchenTPN (CLINIMIX-E) Adult 40 mL/hr at 10/03/14 0600   And  . fat emulsion 240 mL (10/03/14 0600)  . octreotide  (SANDOSTATIN)    IV infusion 50 mcg/hr (10/03/14 0600)  . pantoprozole (PROTONIX) infusion 8 mg/hr (10/03/14 0600)    Time spent: 35 minutes.  The patient is medically complex and requires high complexity decision making and coordination of care with multiple specialists.     LOS: 19 days   [facility] Pager [phone]. If unable to reach me by pager, please call my cell phone at [phone].  *Please refer to [email or web address], password [email or web address] to get updated schedule on who will round on this patient, as hospitalists switch teams weekly. If 7PM-7AM, please contact night-coverage at [email or web address], password [email or web address] for any overnight needs.  10/03/2014, 7:21 AM

## 2014-10-03 NOTE — Progress Notes (Signed)
Patient did ate one bite of ensure pudding and a sip of ensure drink.  Ate a little at breakfast.  Did not eat lunch.  Have continued to encourage food and fluids.

## 2014-10-03 NOTE — Progress Notes (Signed)
Patient ID: Melinda Conrad, female   DOB: 02-16-1951, 64 y.o.   MRN: [id number]  [facility] Progress Note  Subjective: Feeling better each day, started clear liquids this am-only able to take a small amount. No bleeding C/o retaining fluids- my legs are so swollen...  Objective:  Vital signs in last 24 hours: Temp:  [98.3 F (36.8 C)-98.9 F (37.2 C)] 98.4 F (36.9 C) (01/13 0800) Pulse Rate:  [46-76] 76 (01/13 0800) Resp:  [13-22] 18 (01/13 0800) BP: (104-180)/(57-95) 180/95 mmHg (01/13 0800) SpO2:  [94 %-99 %] 94 % (01/13 0800) Weight:  [147 lb 4.3 oz (66.8 kg)] 147 lb 4.3 oz (66.8 kg) (01/13 0000) Last BM Date: 10/02/14 General:   Alert,  Well-developed,  AA female   in NAD Heart:  Regular rate and rhythm; no murmurs Pulm;clear ant Abdomen:  Soft, nontender and nondistended. + fluid in soft tissues Normal bowel sounds, without guarding, and without rebound.   Extremities:  Without edema. Neurologic:  Alert and  oriented x4;  grossly normal neurologically. Psych:  Alert and cooperative. Normal mood and affect.  Intake/Output from previous day: 01/12 0701 - 01/13 0700 In: 2948.5 [P.O.:120; I.V.:1970; IV Piggyback:253.3; TPN:605.2] Out: -  Intake/Output this shift: Total I/O In: -  Out: 300 [Urine:300]  Lab Results:  Recent Labs  09/30/14 1740 10/01/14 0028 10/02/14 1640 10/03/14 0455  WBC 9.0  --  5.1 5.5  HGB 7.7* 9.3* 9.1* 9.4*  HCT 23.6* 27.8* 27.8* 29.0*  PLT 126*  --  97* 99*   BMET  Recent Labs  10/02/14 1015 10/03/14 0455  NA 138 135  K 3.5 3.7  CL 116* 114*  CO2 19 19  GLUCOSE 125* 140*  BUN 9 7  CREATININE 0.59 0.57  CALCIUM 7.0* 7.2*   LFT  Recent Labs  10/03/14 0455  PROT 5.9*  ALBUMIN 1.1*  AST 20  ALT 14  ALKPHOS 470*  BILITOT 0.5   PT/INR  Recent Labs  10/02/14 1640 10/03/14 0455  LABPROT 18.3* 18.5*  INR 1.51* 1.53*     Assessment / Plan: #1  64 yo female with pancreatic adenocarcinoma s/p Whipple  07/2014 with recurrent UGI bleeding secondary to anastomotic ulceration/friability-this had been exacerbated by anticoagulation She has been stable past few days Lovenox being left off Will stop octreotide Stop Protonix infusion, covert to q 12 hours IV Protonix Continue Carafate Diet advancement slowly  Will defer fluid overload to medicine team  Gi will sign off-available if needed Principal Problem:   Septic shock Active Problems:   Depression   Protein-calorie malnutrition, severe   Acute GI bleeding   Hemorrhagic shock   Thrombocytopenia   Portal vein thrombosis   Sepsis   Acute respiratory failure with hypoxia   Hypokalemia   Hypomagnesemia   Sepsis associated hypotension   Bacteremia due to Klebsiella pneumoniae   Anemia due to acute blood loss     LOS: 19 days   Amy Esterwood  10/03/2014, 9:38 AM   GI Attending Note  I have personally taken an interval history, reviewed the chart, and examined the patient.  I agree with the extender's note, impression and recommendations.  Patient has had no further bleeding.  She is without abdominal pain.  Plan to advance diet as tolerated.  Sandy Salaam. Deatra Ina, MD, [facility] [phone]

## 2014-10-04 ENCOUNTER — Encounter: Payer: Self-pay | Admitting: *Deleted

## 2014-10-04 ENCOUNTER — Encounter (HOSPITAL_COMMUNITY): Payer: Self-pay | Admitting: Gastroenterology

## 2014-10-04 ENCOUNTER — Ambulatory Visit: Payer: Medicaid Other

## 2014-10-04 ENCOUNTER — Encounter: Payer: Medicaid Other | Admitting: Nutrition

## 2014-10-04 ENCOUNTER — Other Ambulatory Visit: Payer: Medicaid Other

## 2014-10-04 LAB — TYPE AND SCREEN
ABO/RH(D): O POS
Antibody Screen: NEGATIVE
UNIT DIVISION: 0
Unit division: 0
Unit division: 0

## 2014-10-04 LAB — GLUCOSE, CAPILLARY
GLUCOSE-CAPILLARY: 129 mg/dL — AB (ref 70–99)
GLUCOSE-CAPILLARY: 136 mg/dL — AB (ref 70–99)
GLUCOSE-CAPILLARY: 155 mg/dL — AB (ref 70–99)
Glucose-Capillary: 180 mg/dL — ABNORMAL HIGH (ref 70–99)

## 2014-10-04 LAB — COMPREHENSIVE METABOLIC PANEL
ALT: 11 U/L (ref 0–35)
ANION GAP: 5 (ref 5–15)
AST: 18 U/L (ref 0–37)
Albumin: 1.1 g/dL — ABNORMAL LOW (ref 3.5–5.2)
Alkaline Phosphatase: 424 U/L — ABNORMAL HIGH (ref 39–117)
BUN: 6 mg/dL (ref 6–23)
CO2: 22 mmol/L (ref 19–32)
Calcium: 7.2 mg/dL — ABNORMAL LOW (ref 8.4–10.5)
Chloride: 105 mEq/L (ref 96–112)
Creatinine, Ser: 0.47 mg/dL — ABNORMAL LOW (ref 0.50–1.10)
GFR calc non Af Amer: >90 mL/min (ref >90)
Glucose, Bld: 147 mg/dL — ABNORMAL HIGH (ref 70–99)
Potassium: 3.6 mmol/L (ref 3.5–5.1)
SODIUM: 132 mmol/L — AB (ref 135–145)
Total Bilirubin: 0.4 mg/dL (ref 0.3–1.2)
Total Protein: 6 g/dL (ref 6.0–8.3)

## 2014-10-04 LAB — PHOSPHORUS: Phosphorus: 3.4 mg/dL (ref 2.3–4.6)

## 2014-10-04 LAB — MAGNESIUM: Magnesium: 1.3 mg/dL — ABNORMAL LOW (ref 1.5–2.5)

## 2014-10-04 MED ORDER — CLINIMIX E/DEXTROSE (5/15) 5 % IV SOLN
INTRAVENOUS | Status: AC
  Administered 2014-10-04: 17:00:00 via INTRAVENOUS
  Filled 2014-10-04: qty 1920

## 2014-10-04 MED ORDER — INSULIN ASPART 100 UNIT/ML ~~LOC~~ SOLN
0.0000 [IU] | Freq: Three times a day (TID) | SUBCUTANEOUS | Status: DC
  Administered 2014-10-07 – 2014-10-08 (×2): 1 [IU] via SUBCUTANEOUS

## 2014-10-04 MED ORDER — FUROSEMIDE 10 MG/ML IJ SOLN
60.0000 mg | Freq: Once | INTRAMUSCULAR | Status: AC
  Administered 2014-10-04: 60 mg via INTRAVENOUS
  Filled 2014-10-04: qty 6

## 2014-10-04 MED ORDER — MAGNESIUM SULFATE 2 GM/50ML IV SOLN
2.0000 g | Freq: Once | INTRAVENOUS | Status: AC
  Administered 2014-10-04: 2 g via INTRAVENOUS
  Filled 2014-10-04: qty 50

## 2014-10-04 MED ORDER — FUROSEMIDE 40 MG PO TABS: 80.0000 mg | ORAL_TABLET | Freq: Every day | ORAL | Status: DC

## 2014-10-04 NOTE — Progress Notes (Signed)
Received report from ICU nurse Manuela Schwartz. Patient arrived to unit via bed at about 1800. Transferred to bed with assistance. Patient assessed. Will continue current plan of care.

## 2014-10-04 NOTE — Progress Notes (Addendum)
PARENTERAL NUTRITION CONSULT NOTE   Pharmacy Consult for TPN Indication: Severe protein calorie malnutrion  No Known Allergies  Patient Measurements: Height: '5\' 3"'  (160 cm) Weight: 147 lb 7.8 oz (66.9 kg) IBW/kg (Calculated) : 52.4 Adjusted Body Weight: 56.5 kg  Vital Signs: Temp: 98.4 F (36.9 C) (01/14 0800) Temp Source: Oral (01/14 0800) BP: 150/73 mmHg (01/14 0800) Pulse Rate: 59 (01/14 0800) Intake/Output from previous day: 01/13 0701 - 01/14 0700 In: 1850 [P.O.:75; I.V.:315; IV Piggyback:50; TPN:1410] Out: 700 [Urine:700] Intake/Output from this shift: Total I/O In: 80 [I.V.:10; TPN:70] Out: 350 [Urine:350]  Labs:  Recent Labs  10/02/14 1640 10/03/14 0455  WBC 5.1 5.5  HGB 9.1* 9.4*  HCT 27.8* 29.0*  PLT 97* 99*  INR 1.51* 1.53*     Recent Labs  10/02/14 1015 10/03/14 0455 10/04/14 0500  NA 138 135 132*  K 3.5 3.7 3.6  CL 116* 114* 105  CO2 '19 19 22  ' GLUCOSE 125* 140* 147*  BUN '9 7 6  ' CREATININE 0.59 0.57 0.47*  CALCIUM 7.0* 7.2* 7.2*  MG 1.4* 1.6 1.3*  PHOS 2.6 2.9 3.4  PROT 5.4* 5.9* 6.0  ALBUMIN 1.0* 1.1* 1.1*  AST '20 20 18  ' ALT '15 14 11  ' ALKPHOS 488* 470* 424*  BILITOT 0.7 0.5 0.4  PREALBUMIN  --  <3.0*  --   TRIG  --  98  --    Estimated Creatinine Clearance: 67.8 mL/min (by C-G formula based on Cr of 0.47).    Recent Labs  10/03/14 1251 10/03/14 1715 10/04/14 0007  GLUCAP 136* 115* 155*    Medical History: Past Medical History  Diagnosis Date  . Arthritis   . Depression   . H/O: substance abuse   . Back pain   . Personal history of colonic polyp - adenoma 10/26/2013    10/26/2013 diminutive sigmoid polyp removed    . Pancreatitis, acute   . Hypertension   . Osteoporosis   . Radiation 04/30/14-06/08/14    pancreas 50 gray  . Cancer     pancreatic cancer   Insulin Requirements: 3 unit Novolog sensitive SSI  Current Nutrition: full liquids, orders for ensure complete and ensure pudding TID between meals  IVF: D5NS  at [facility] access: PICC TPN start date: 10/02/14  ASSESSMENT                                                                                                          HPI:  62 y/oF with PMH of pancreatitis, HTN, pancreatic cancer with prior Whipple surgery (08/07/14) with preoperative radiation and chemotherapy admitted to ICU on 09/14/2014 with Hgb 5.8, several days of melanotic stools, fever, hematemesis, sepsis secondary to bacteremia.  Patient is s/p EGD on 1/8 with friable areas at anastomosis.  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  Pharmacy consulted today to start TPN for this patient.    Significant events:   Today:    Glucose - CBGs at goal <182m/dl except x 1 CBG at 155. CBGs may be higher  now that also taking some PO.  Electrolytes - Na = 132, Mag low , Corrected Ca WNL at 9.5  Renal -SCr low, stable (given lasix 89m IV x 1 1/14)  LFTs - AST/ALT WNL, Alk Phos elevated but improving  TGs - 98 (1/13)  Prealbumin - <3 reflects severely depleted protein stores  NUTRITIONAL GOALS                                                                 RD recs: Kcal:1600-1800 Protein: 90-100 gram Fluid: >/= 1600 ml daily Clinimix 5/15 at a goal rate of 810mhr + 20% fat emulsion MWF at 1042mr to provide: 96g/day protein, avg 1569Kcal/day.  PLAN  ______________________________________________  At 1800 today:  Increase Clinimix E 5/15 to 80 ml/hr despite diet being advanced to full liquids as prealbumin <3 and can wean down once proves able to eat solid/soft diet consistently   20% fat emulsion at 10 ml/hr on MWF  Fat emulsion only being given on MWF d/t national backorder  TPN to contain standard multivitamins and trace elements.  Continue sensitive SSI q6h..   TPN lab panels on Mondays & Thursdays.  magnesium 2gm IVPB x 1 dose (ordered per TRH)  F/u daily.  DusDoreene ElandharmD, BCPS.   Pager:  [phone] 9:52 AM

## 2014-10-04 NOTE — Progress Notes (Signed)
Patient ID: Melinda Conrad, female   DOB: 1951-04-30, 64 y.o.   MRN: [id number] 6 Days Post-Op  Subjective: Pt remains stable.  Tolerating clear liquids.    Objective: Vital signs in last 24 hours: Temp:  [98 F (36.7 C)-98.5 F (36.9 C)] 98.4 F (36.9 C) (01/14 0316) Pulse Rate:  [52-62] 54 (01/14 0600) Resp:  [15-25] 19 (01/14 0600) BP: (132-185)/(65-89) 136/67 mmHg (01/14 0600) SpO2:  [94 %-97 %] 96 % (01/14 0600) Weight:  [147 lb 7.8 oz (66.9 kg)] 147 lb 7.8 oz (66.9 kg) (01/14 0500) Last BM Date: 10/03/14  Intake/Output from previous day: 01/13 0701 - 01/14 0700 In: 1850 [P.O.:75; I.V.:315; IV Piggyback:50; TPN:1410] Out: 700 [Urine:700] Intake/Output this shift:    Resp: breathing comfortably Cardio: regular rate and rhythm GI: soft, nontender  Lab Results:   Recent Labs  10/02/14 1640 10/03/14 0455  WBC 5.1 5.5  HGB 9.1* 9.4*  HCT 27.8* 29.0*  PLT 97* 99*   BMET  Recent Labs  10/03/14 0455 10/04/14 0500  NA 135 132*  K 3.7 3.6  CL 114* 105  CO2 19 22  GLUCOSE 140* 147*  BUN 7 6  CREATININE 0.57 0.47*  CALCIUM 7.2* 7.2*   PT/INR  Recent Labs  10/02/14 1640 10/03/14 0455  LABPROT 18.3* 18.5*  INR 1.51* 1.53*   ABG No results for input(s): PHART, HCO3 in the last 72 hours.  Invalid input(s): PCO2, PO2  Studies/Results: Dg Chest 1 View  10/02/2014   CLINICAL DATA:  64 year old follow-up pleural effusion  EXAM: CHEST - 1 VIEW  COMPARISON:  09/29/2014  FINDINGS: A right-sided PICC line catheter tip projects over the region of the cavoatrial junction.  The cardiac silhouette and mediastinal contours are unchanged.  There has been interval development of increased patchy areas of consolidation, predominantly involving the left lung base. There is no large pleural effusion. There is no pneumothorax. Interstitial prominence is present.  The osseous structures are unchanged.  IMPRESSION: 1. Findings remain compatible with mildly increased  interstitial edema. 2. Bibasilar consolidation, left greater than right, question developing pneumonia versus atelectasis.   Electronically Signed   By: Rosemarie Ax   On: 10/02/2014 11:34    Anti-infectives: Anti-infectives    Start     Dose/Rate Route Frequency Ordered Stop   09/26/14 1400  piperacillin-tazobactam (ZOSYN) IVPB 3.375 g  Status:  Discontinued     3.375 g12.5 mL/hr over 240 Minutes Intravenous 3 times per day 09/26/14 1342 09/28/14 1649   09/17/14 1200  cefTRIAXone (ROCEPHIN) 2 g in dextrose 5 % 50 mL IVPB - Premix  Status:  Discontinued     2 g100 mL/hr over 30 Minutes Intravenous Every 24 hours 09/17/14 1013 09/26/14 1315   09/15/14 1800  piperacillin-tazobactam (ZOSYN) IVPB 3.375 g  Status:  Discontinued     3.375 g12.5 mL/hr over 240 Minutes Intravenous Every 8 hours 09/15/14 1741 09/17/14 1013   09/14/14 2245  cefTRIAXone (ROCEPHIN) 1 g in dextrose 5 % 50 mL IVPB  Status:  Discontinued     1 g100 mL/hr over 30 Minutes Intravenous Every 24 hours 09/14/14 2238 09/15/14 1730      Assessment/Plan: s/p Procedure(s): ESOPHAGOGASTRODUODENOSCOPY (EGD) (N/A) Full liquids for now.  Soft diet after stable.  TNA Monitor blood sugar Carafate.  Continue IV protonix.    LOS: 20 days    [facility] 10/04/2014

## 2014-10-04 NOTE — Progress Notes (Signed)
Opened in error

## 2014-10-04 NOTE — Progress Notes (Signed)
NUTRITION FOLLOW-UP  INTERVENTION: -TPN per pharmacy. -Continue Ensure TID -Continue Ensure Pudding po BID -RD to continue to monitor  NUTRITION DIAGNOSIS: Inadequate oral intake related to nausea/abd pain/decreased appetite as evidenced by PO intake < 75%, 10 lb wt loss in one month, ongoing.  Goal: Pt to meet >/= 90% of their estimated nutrition needs, not met   Monitor:  Diet order, total protein/energy intake, labs, weights, GI profile, education needs  ASSESSMENT: Admitted to PCCM service via ED with melena X several days, isolated fever on evening prior to admission (to 104.0 and trated with ibuprofen), episode of hematemesis on evening of presentation to ED for which EMS was dispatched  12/28 -Pt familiar to RD from previous admit in 07/2014 -Pt s/p Whipple procedure; was on IMPACT supplementation trial that required  3 supplements/day for 5 days pre op and 5 days post op .  -Pt continued to be followed by outpatient oncology RD post d/c. Was last seen on 08/24/14. RD had encouraged pt to consume Ensure TID, and was provided with complimentary case of supplement. RD also educated pt on increasing intake of high protein/kcal foods -Pt lethargic during initial RD assessment today. Pt reported ongoing decreased appetite, and not able to tolerate significant intake of solid foods. Was drinking Ensure at home, but was unable to quantify amount daily -Endorsed ongoing weight loss of 10 lb in past one month (8.3% body weight loss, severe for time frame) -NPO for GI bleed. Possible EGD pending surgery recommendations. Palliative care c/s pending. -Refeeding risk d/t chronic illness, severe wt loss, and prolonged period of sup-optimal intake ( > one  Month) -Phos/Mg/K low on admit. Mg now WNL w/repletion  09/24/14 -Pt now on regular diet. PO intake: 50-100% -Pt's weight is +8 lb since 12/28 -Pt is receiving Ensure supplements, states she is drinking them. -Pt states that she plans to  continue drinking supplements at home. Pt feels better today. -Will continue current interventions  10/01/14: - Pt NPO due to hematemesis. Recommend soft diet when stable.  - Pt reports good appetite prior to vomiting. Pt currently hungry. She reports that she was drinking Ensure prior to NPO. Recommend continuing supplements once diet advanced.  - Wt has trended up from 111 lbs on 12/28. Current weight is 133 lbs. Pt in positive fluid balance (+16 L since admission).   11/14: - s/p EGD - Current diet is full liquids.  - Pt drank coffee and ate 3-4 bites of grits this morning for breakfast which she tolerated. She drank Ensure yesterday. Pt likes pudding, but did not feel like it this morning.  - Wt continues to trend up. Now 147 lbs (111 lbs on 12/28). Pt remains in positive fluid balance. Lasix has been increased.  - TPN currently running at 60 mL/hr.   Labs: CBGs: 115-155 Na low K WNL Mg low Phos WNL  Pharmacy Note from 1/13:  At 1800 today:  Increase Clinimix E 5/15 to 60 ml/hr.  20% fat emulsion at 10 ml/hr on MWF  Fat emulsion only being given on MWF d/t national backorder  Plan to advance as tolerated to the goal rate.  TPN to contain standard multivitamins and trace elements.  Height: Ht Readings from Last 1 Encounters:  10/01/14 _0  (1.6 m)   Weight: Wt Readings from Last 1 Encounters:  10/04/14 147 lb 7.8 oz (66.9 kg)   BMI:  Body mass index is 26.13 kg/(m^2).  Estimated Nutritional Needs: [id number] Protein: 90-100 gram Fluid: >/= 1600 ml  daily  Skin: surgical incision on abd  Diet Order: TPN (CLINIMIX-E) Adult Diet full liquid .TPN (CLINIMIX-E) Adult  EDUCATION NEEDS: -No education needs identified at this time   Intake/Output Summary (Last 24 hours) at 10/04/14 1141 Last data filed at 10/04/14 1031  Gross per 24 hour  Intake   1750 ml  Output   1300 ml  Net    450 ml    Last BM: 1/12  Labs:   Recent Labs Lab 10/02/14 1015  10/03/14 0455 10/04/14 0500  NA 138 135 132*  K 3.5 3.7 3.6  CL 116* 114* 105  CO2 _0 BUN _1 CREATININE 0.59 0.57 0.47*  CALCIUM 7.0* 7.2* 7.2*  MG 1.4* 1.6 1.3*  PHOS 2.6 2.9 3.4  GLUCOSE 125* 140* 147*    CBG (last 3)   Recent Labs  10/03/14 1251 10/03/14 1715 10/04/14 0007  GLUCAP 136* 115* 155*    Scheduled Meds: . antiseptic oral rinse  7 mL Mouth Rinse BID  . feeding supplement (ENSURE COMPLETE)  237 mL Oral TID BM  . feeding supplement (ENSURE)  1 Container Oral TID BM  . fentaNYL  25 mcg Transdermal Q72H  . insulin aspart  0-9 Units Subcutaneous 4 times per day  . lidocaine  1 patch Transdermal Q24H  . pantoprazole (PROTONIX) IV  40 mg Intravenous Q12H  . potassium chloride  20 mEq Oral BID  . pregabalin  25 mg Oral QHS  . sertraline  100 mg Oral q morning - 10a  . sodium chloride  10-40 mL Intracatheter Q12H  . sucralfate  1 g Oral TID WC & HS    Continuous Infusions: . Marland KitchenTPN (CLINIMIX-E) Adult    . dextrose 5 % and 0.9% NaCl 1,000 mL infusion 10 mL/hr at 10/03/14 0600  . Marland KitchenTPN (CLINIMIX-E) Adult 60 mL/hr at 10/03/14 1900   And  . fat emulsion 10 kcal (10/03/14 1900)   Laurette Schimke MS, RD, LDN

## 2014-10-04 NOTE — Progress Notes (Signed)
Progress Note   Melinda Conrad WPY:[id number] DOB: December 07, 1950 DOA: 09/14/2014 PCP: Kevan Ny, MD   Brief Narrative:   Melinda Conrad is an 64 y.o. female with history of pancreatitis, hypertension, pancreatic cancer with prior Whipple surgery (08/07/14) with preoperative radiation and chemotherapy. Admitted to ICU12/25/2015 with a hemoglobin of 5.8. And a complaint of several days of melanotic stools, fever up to 104.29F, and hematemesis.   09/14/14 CT abd/pelvis showed two ovoid high-density lesions adjacent to the pancreatic stent within the jejunum could represent hemorrhage or hematoma. No additional gross evidence of surgical complication. Gastroenterology was consulted.the patient was placed on an IV Protonix drip. Initial ?GI bleed was due to chronic NSAID use vs ischemia induced from her hypotension. EGD was deferred because of the patient's persistent hypotension and clincial instability at the time of sentinel bleed.  Blood cultures performed at the time of admission revealed Klebsiella pneumoniae. The patient was initially started on Zosyn, but this was narrowed to ceftriaxone and subsequently d/c altoghter 09/28/14.  CT abd/pelvis for abd pain 09/16/2014 with new portal vein thrombus, bilateral pleural effusions with bibasilar infiltrates .Gen. surgery was consulted. After careful consideration amongst all consultants due to the patient's recent GI bleed, the patient was started on a heparin drip.   The patient continued to have progressive thrombocytopenia. Heme/Onc, Dr. Benay Spice was consulted. The patient's heparin drip was subsequently held with the hopes that with her thrombocytopenia will improved with treatment for sepsis. Thrombocytopenia improving.   Unfortunately hemoglobin dropped 9-->6.7, patient complained of left flank pain not relieved by IV pain medications. Persistently hypotensive over the course of 1/5-1/6 therefore transferred back to step down unit.  Heparin completely discontinued because of new onset anemia She devleoped 2 further epiosdes of N/V of BRB on 09/30/13 and Gen surgery and GI input were sought.  Unclear etilogy of Abd pain- might represent extension of Portal venous thrombosis?  She has reached another plateau wherein she has stopped having any GI bleed as of 10/02/14, however now the ? Is when is it truly safe to feed her and whether she will recover completely from this situation?   A limited discussion was undertaken regarding what options we have moving forward and Surg-Onc and GI are to weigh in if patient re-bleeds again and I have discussed with the patient that we will need to come to a decision about what to do if patient suffers from recurrent bleeding.  Assessment/Plan:   Principal problem: Severe sepsis/Klebsiella pneumonia bacteremia  Patient was admitted with sepsis with Klebsiella bacteremia. Patient improving clinically. Afebrile,off pressors.   Was on IV Rocephin-->Zosyn as below.  CXR showed persisting PNA.  Cultures from 09/19/14 and 09/27/14, negative.   As PCT is 0.2, IV antibiotics were discontinued on 09/28/14.  Klebsiella bacteremia possibly seeded from the GI source?   CT scan abdomen pelvis 09/16/14 = bibasilar infiltrates right middle lobe-new from chest x-ray 09/14/14.  Transition antibiotics from Rocephin->Zosyn 09/26/14.  Active problems:  Hemorrhagic shock/acute blood loss anemia from Staple line of Whipple on Endoscopy 1/8 + s/p Whipple T3N1   Endoscopy 1/8=Friable anastamosis with bleeding.  Re-bled again 09/30/14.  Patient status post 4 units packed red blood cells, with last transfusion given 09/27/14.  Pantoprazole infusion initiated 09/30/14-stopped 10/03/14. Continue Protonix twice a day.  Octreotide infusion initiated 09/30/14-stopped 10/03/14.  Advanced to [facility] per surgery on 10/04/14.  Has tolerated CL diet.  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  Platelet count improving. For now heparin/Lovenox remain on hold.  Acute respiratory failure  Concern for TRALI secondary to transfusions versus acute lung injury from bacteremia.   Patient Net +17 L since admission. Gently diurese. Monitor daily weights. No appreciable diuresis with 40 mg of oral Lasix (but husband has not been recording output), no appreciable weight loss either, so will increase Lasix to 60 mg IV daily.  Respiratory status stable.  Thrombocytopenia secondary to DIC and massive ABLA-Nadir = PLT 21 on 09/17/14  Likely secondary to sepsis with possible DIC and consumption.   Current PLT count stable. Platelet function showing recovery and improving.   No schistocytes on peripheral smear. Patient may have had low-grade DIC in the setting of sepsis ?  Acute kidney injury  Secondary to sepsis and pre-azotemia secondary to hypotension. Improved and currently at baseline.   Monitor creatinine with resumption of Lasix.  Severe protein calorie malnutrition  Patient has been seen by dietitian.  TPN started 10/02/14.  Can begin to wean once diet advanced to solid food/soft diet.  ALk phos elevated for unclear reasons-monitoring. Is beginning to trend down.  Pancreatic adenocarcinoma s/p Whipple  Status post Whipple's with preoperative XRT and chemotherapy.   Patient has been seen by Dr. Benay Spice of oncology and chemotherapy on hold for now. Outpatient follow-up with oncology.  Hypokalemia/hypomagnesemia  Likely secondary to diuretics. Repleted.  Magnesium low today, we'll replete.  Deconditioning/debility  Patient has been seen by physical therapy recommended home health PT.  Prophylaxis  Protonix for GI prophylaxis. SCDs for DVT prophylaxis.  Code Status: Full. Family Communication: Husband, [name], updated at the bedside. Disposition Plan: Home when stable.   IV Access:    PICC placed  09/29/14   Procedures and diagnostic studies:   Dg Chest Port 1 View 09/14/2014: 1. Right IJ line noted ending about the mid SVC. 2. Mild vascular congestion noted. Increased interstitial markings may reflect minimal interstitial edema.    Ct Abdomen Pelvis Wo Contrast 09/14/2014: 1. Two ovoid high-density lesions adjacent to the pancreatic stent within the jejunum could represent hemorrhage or hematoma. 2. No additional gross evidence of surgical complication on this non IV and oral contrast exam. 3. Potential small additional clot within the stomach is indeterminate.  Dg Abd Portable 2v 09/16/2014: Negative.     Ct Abdomen Pelvis W Contrast 09/16/2014: Changes consistent with prior Whipple procedure with pancreatic stent in place. Some postoperative free fluid is noted although no organized collection to suggest abscess is noted at this time.  New main portal vein thrombus which is nonocclusive but occupies a majority of the lumen of the main portal vein. There is some differential enhancement of the liver identified which in part is likely related to this thrombus and timing of the contrast bolus.  New bilateral pleural effusions with bibasilar infiltrates particularly in the right middle lobe. This may be the etiology of the patient's elevated white blood cell count. These are new from the prior exam from 09/14/2014.  Changes of anasarca.    Dg Chest Port 1 View 09/17/2014: 1. Interval development of widespread interstitial and airspace disease throughout the lungs bilaterally concerning for severe multilobar pneumonia.    Dg Chest Port 1 View 09/19/2014: 1. Right IJ line in stable position. 2. Severe dense diffuse bilateral pulmonary infiltrates, no change from prior exam .    Dg Chest Port 1 View 09/21/2014: Severe diffuse bilateral airspace disease, unchanged.     Dg Chest Port 1 View 09/22/2014: Slightly improved aeration  bilaterally. Persistent interstitial pattern of infiltrates bilaterally.      Ct Abdomen Pelvis W Contrast 09/26/2014: 1. Again noted nonobstructive small amount of thrombus in main portal vein without change from prior exam. 2. Stable postsurgical changes post Whipple procedure. Again noted pancreatic duct stent in place. Stable heterogeneous pattern of enhancement of the liver 3. Again noted small amount of perihepatic and perisplenic ascites. Small amount of free fluid is noted in right paracolic gutter. Anasarca infiltration subcutaneous fat abdominal and pelvic wall again noted. 4. No small bowel or colonic obstruction. Moderate stool noted in right colon and sigmoid colon. No pericecal inflammation. Normal appendix. 5. Mild distended urinary bladder. 6. No hydronephrosis or hydroureter. Bilateral renal symmetrical excretion.    Dg Chest 2 View 09/28/2014: Slight improvement in mixed interstitial and airspace disease, possibly due to edema, with small bilateral effusions.    EGD 09/28/14: 1. Prior Whipple surgery with gastroenterostomy in the gastric body 2. Fiable areas at the anastomosis 3. The EGD otherwise appeared normal  Dg Chest Port 1 View 09/29/2014: 1. Right PICC line in good position. 2. Interstitial edema pattern.    2-D echocardiogram 03/30/61: Normal systolic function with an EF 55-60 percent. No regional wall motion abnormalities.  Dg Chest 1 View 10/02/2014: 1. Findings remain compatible with mildly increased interstitial edema. 2. Bibasilar consolidation, left greater than right, question developing pneumonia versus atelectasis.     Medical Consultants:    Dr. Erskine Emery, Gastroenterology   Dr. Stark Klein, Surgery  Dr. Lane Hacker, Palliative Care  Dr. Baltazar Apo, Critical Care  Anti-Infectives:    Ceftriaxone 12/25 >> 12/26  Zosyn 12/26 >>12/28  Rocephin 12/28>>> 09/26/14  Zosyn 1/6---09/28/14  Subjective:   [name] reports ongoing 6/10 abdominal pain.  No N/V.  Urinating frequently with lasix.  Less  dyspneic.  Objective:    Filed Vitals:   10/04/14 0316 10/04/14 0400 10/04/14 0500 10/04/14 0600  BP:  155/87  136/67  Pulse:  58  54  Temp: 98.4 F (36.9 C)     TempSrc: Oral     Resp:  21  19  Height:      Weight:   66.9 kg (147 lb 7.8 oz)   SpO2:  96%  96%    Intake/Output Summary (Last 24 hours) at 10/04/14 0707 Last data filed at 10/03/14 2300  Gross per 24 hour  Intake   1210 ml  Output    700 ml  Net    510 ml    Exam: Gen:  NAD Cardiovascular:  RRR, No M/R/G, hyperdynamic precordium Respiratory:  Lungs CTAB Gastrointestinal:  Abdomen soft, NT/ND, + BS Extremities:  2+ edema   Data Reviewed:    Labs: Basic Metabolic Panel:  Recent Labs Lab 09/28/14 0500 09/29/14 0431 10/02/14 1015 10/03/14 0455 10/04/14 0500  NA 136 136 138 135 132*  K 3.6 3.4* 3.5 3.7 3.6  CL 111 113* 116* 114* 105  CO2 '19 20 19 19 22  ' GLUCOSE 65* 64* 125* 140* 147*  BUN '6 6 9 7 6  ' CREATININE 0.64 0.57 0.59 0.57 0.47*  CALCIUM 7.2* 7.0* 7.0* 7.2* 7.2*  MG  --   --  1.4* 1.6 1.3*  PHOS  --  3.2 2.6 2.9 3.4   GFR Estimated Creatinine Clearance: 67.8 mL/min (by C-G formula based on Cr of 0.47). Liver Function Tests:  Recent Labs Lab 09/27/14 1215 09/28/14 0500 09/29/14 0431 10/02/14 1015 10/03/14 0455 10/04/14 0500  AST 25 24  --  '20 20 18  ' ALT 14 14  --  '15 14 11  ' ALKPHOS 240* 269*  --  488* 470* 424*  BILITOT 0.7 0.6  --  0.7 0.5 0.4  PROT 5.3* 5.4*  --  5.4* 5.9* 6.0  ALBUMIN 1.1* 1.1* <1.0* 1.0* 1.1* 1.1*   Coagulation profile  Recent Labs Lab 09/30/14 1540 10/02/14 1640 10/03/14 0455  INR 1.51* 1.51* 1.53*    CBC:  Recent Labs Lab 09/29/14 0431 09/30/14 0515 09/30/14 1245 09/30/14 1740 10/01/14 0028 10/02/14 1640 10/03/14 0455  WBC 4.4 5.0 6.0 9.0  --  5.1 5.5  NEUTROABS 2.6 3.3  --  7.3  --  3.4 3.7  HGB 8.9* 8.8* 8.8* 7.7* 9.3* 9.1* 9.4*  HCT 26.9* 26.5* 26.6* 23.6* 27.8* 27.8* 29.0*  MCV 90.0 89.8 89.9 90.1  --  90.3 90.1  PLT 148*  134* 123* 126*  --  97* 99*   Cardiac Enzymes:  Recent Labs Lab 09/27/14 1215 09/27/14 1830 09/27/14 2151  TROPONINI 0.04* 0.03 0.03   CBG: Sepsis Labs:  Recent Labs Lab 09/27/14 1215 09/27/14 1451 09/28/14 0500 09/29/14 0431  09/30/14 1245 09/30/14 1740 10/02/14 1640 10/03/14 0455  PROCALCITON  --  0.20 0.20 0.12  --   --   --   --   --   WBC 5.9  --  5.5 4.4  < > 6.0 9.0 5.1 5.5  LATICACIDVEN 2.2  --   --   --   --   --   --   --   --   < > = values in this interval not displayed. Microbiology Recent Results (from the past 240 hour(s))  Culture, blood (routine x 2)     Status: None   Collection Time: 09/27/14 12:40 PM  Result Value Ref Range Status   Specimen Description BLOOD RIGHT HAND  Final   Special Requests BOTTLES DRAWN AEROBIC ONLY [place]  Final   Culture   Final    NO GROWTH 5 DAYS Performed at [facility]    Report Status 10/03/2014 FINAL  Final  Culture, blood (routine x 2)     Status: None   Collection Time: 09/27/14 12:45 PM  Result Value Ref Range Status   Specimen Description BLOOD EFT ARM  Final   Special Requests BOTTLES DRAWN AEROBIC AND ANAEROBIC 3CC  Final   Culture   Final    NO GROWTH 5 DAYS Performed at [facility]    Report Status 10/03/2014 FINAL  Final     Medications:   . antiseptic oral rinse  7 mL Mouth Rinse BID  . feeding supplement (ENSURE COMPLETE)  237 mL Oral TID BM  . feeding supplement (ENSURE)  1 Container Oral TID BM  . fentaNYL  25 mcg Transdermal Q72H  . furosemide  40 mg Oral Daily  . insulin aspart  0-9 Units Subcutaneous 4 times per day  . lidocaine  1 patch Transdermal Q24H  . pantoprazole (PROTONIX) IV  40 mg Intravenous Q12H  . potassium chloride  20 mEq Oral BID  . pregabalin  25 mg Oral QHS  . sertraline  100 mg Oral q morning - 10a  . sodium chloride  10-40 mL Intracatheter Q12H  . sucralfate  1 g Oral TID WC & HS   Continuous Infusions: . dextrose 5 % and 0.9% NaCl 1,000 mL  infusion 10 mL/hr at 10/03/14 0600  . Marland KitchenTPN (CLINIMIX-E) Adult 60 mL/hr at 10/03/14 1900   And  . fat  emulsion 10 kcal (10/03/14 1900)    Time spent: 35 minutes.  The patient is medically complex and requires high complexity decision making and coordination of care with multiple specialists.     LOS: 20 days   [facility] Pager [phone]. If unable to reach me by pager, please call my cell phone at [phone].  *Please refer to [email or web address], password [email or web address] to get updated schedule on who will round on this patient, as hospitalists switch teams weekly. If 7PM-7AM, please contact night-coverage at [email or web address], password [email or web address] for any overnight needs.  10/04/2014, 7:07 AM

## 2014-10-05 LAB — GLUCOSE, CAPILLARY
Glucose-Capillary: 105 mg/dL — ABNORMAL HIGH (ref 70–99)
Glucose-Capillary: 116 mg/dL — ABNORMAL HIGH (ref 70–99)
Glucose-Capillary: 93 mg/dL (ref 70–99)
Glucose-Capillary: 105 mg/dL — ABNORMAL HIGH (ref 70–99)

## 2014-10-05 LAB — BASIC METABOLIC PANEL
Anion gap: 3 — ABNORMAL LOW (ref 5–15)
BUN: 7 mg/dL (ref 6–23)
CO2: 28 mmol/L (ref 19–32)
Calcium: 7.2 mg/dL — ABNORMAL LOW (ref 8.4–10.5)
Chloride: 104 mEq/L (ref 96–112)
Creatinine, Ser: 0.38 mg/dL — ABNORMAL LOW (ref 0.50–1.10)
GFR calc Af Amer: >90 mL/min (ref >90)
GFR calc non Af Amer: >90 mL/min (ref >90)
Glucose, Bld: 109 mg/dL — ABNORMAL HIGH (ref 70–99)
Potassium: 3.6 mmol/L (ref 3.5–5.1)
SODIUM: 135 mmol/L (ref 135–145)

## 2014-10-05 LAB — PHOSPHORUS: Phosphorus: 4.1 mg/dL (ref 2.3–4.6)

## 2014-10-05 LAB — MAGNESIUM: Magnesium: 1.5 mg/dL (ref 1.5–2.5)

## 2014-10-05 MED ORDER — FAT EMULSION 20 % IV EMUL
240.0000 mL | INTRAVENOUS | Status: AC
  Administered 2014-10-05: 240 mL via INTRAVENOUS
  Filled 2014-10-05: qty 250

## 2014-10-05 MED ORDER — CLINIMIX E/DEXTROSE (5/15) 5 % IV SOLN
INTRAVENOUS | Status: AC
  Administered 2014-10-05: 18:00:00 via INTRAVENOUS
  Filled 2014-10-05: qty 1920

## 2014-10-05 MED ORDER — DEXTROSE-NACL 5-0.9 % IV SOLN
INTRAVENOUS | Status: DC
  Administered 2014-10-05: 13:00:00 via INTRAVENOUS

## 2014-10-05 NOTE — Progress Notes (Signed)
Progress Note   Melinda Conrad AJG:[id number] DOB: October 12, 1950 DOA: 09/14/2014 PCP: Kevan Ny, MD   Brief Narrative:   Melinda Conrad is an 64 y.o. female with history of pancreatitis, hypertension, pancreatic cancer with prior Whipple surgery (08/07/14) with preoperative radiation and chemotherapy. Admitted to ICU12/25/2015 with a hemoglobin of 5.8. And a complaint of several days of melanotic stools, fever up to 104.73F, and hematemesis.   09/14/14 CT abd/pelvis showed two ovoid high-density lesions adjacent to the pancreatic stent within the jejunum could represent hemorrhage or hematoma. No additional gross evidence of surgical complication. Gastroenterology was consulted.the patient was placed on an IV Protonix drip. Initial ?GI bleed was due to chronic NSAID use vs ischemia induced from her hypotension. EGD was deferred because of the patient's persistent hypotension and clincial instability at the time of sentinel bleed.  Blood cultures performed at the time of admission revealed Klebsiella pneumoniae. The patient was initially started on Zosyn, but this was narrowed to ceftriaxone and subsequently d/c altoghter 09/28/14.  CT abd/pelvis for abd pain 09/16/2014 with new portal vein thrombus, bilateral pleural effusions with bibasilar infiltrates .Gen. surgery was consulted. After careful consideration amongst all consultants due to the patient's recent GI bleed, the patient was started on a heparin drip.   The patient continued to have progressive thrombocytopenia. Heme/Onc, Dr. Benay Spice was consulted. The patient's heparin drip was subsequently held with the hopes that with her thrombocytopenia will improved with treatment for sepsis. Thrombocytopenia improving.   Unfortunately hemoglobin dropped 9-->6.7, patient complained of left flank pain not relieved by IV pain medications. Persistently hypotensive over the course of 1/5-1/6 therefore transferred back to step down unit.  Heparin completely discontinued because of new onset anemia She devleoped 2 further epiosdes of N/V of BRB on 09/30/13 and Gen surgery and GI input were sought.  Unclear etilogy of Abd pain- might represent extension of Portal venous thrombosis?  She has reached another plateau wherein she has stopped having any GI bleed as of 10/02/14, however now the ? Is when is it truly safe to feed her and whether she will recover completely from this situation?   A limited discussion was undertaken regarding what options we have moving forward and Surg-Onc and GI are to weigh in if patient re-bleeds again and I have discussed with the patient that we will need to come to a decision about what to do if patient suffers from recurrent bleeding.  Assessment/Plan:   Principal problem: Severe sepsis/Klebsiella pneumonia bacteremia  Patient was admitted with sepsis with Klebsiella bacteremia. CXR compatible with PNA.  Patient improving clinically. Afebrile,off pressors.   Status post a full treatment course with Rocephin--->Zosyn with antibiotics discontinued on 09/28/14.  Cultures from 09/19/14 and 09/27/14, negative.   CT scan abdomen pelvis 09/16/14 = bibasilar infiltrates right middle lobe-new from chest x-ray 09/14/14.  Active problems:  Hemorrhagic shock/acute blood loss anemia from Staple line of Whipple on Endoscopy 1/8 + s/p Whipple T3N1   Endoscopy 1/8=Friable anastamosis with bleeding.  Re-bled again 09/30/14.  Patient status post 4 units packed red blood cells, with last transfusion given 09/27/14.  Pantoprazole infusion initiated 09/30/14-stopped 10/03/14. Continue Protonix twice a day.  Octreotide infusion initiated 09/30/14-stopped 10/03/14.  Advanced to soft per surgery on 10/05/14.    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  Platelet count improving. For now heparin/Lovenox remain on hold.  Acute  respiratory failure  Concern for TRALI secondary to transfusions versus acute lung  injury from bacteremia.   Patient Net +17 L since admission. Gently diurese. Monitor daily weights. No appreciable diuresis with 40 mg of oral Lasix (but husband has not been recording output), no appreciable weight loss either, continue Lasix 60 mg IV daily.  Respiratory status stable.  Thrombocytopenia secondary to DIC and massive ABLA-Nadir = PLT 21 on 09/17/14  Likely secondary to sepsis with possible DIC and consumption.   Current PLT count stable. Platelet function showing recovery and improving.   No schistocytes on peripheral smear. Patient may have had low-grade DIC in the setting of sepsis ?  Acute kidney injury  Secondary to sepsis and pre-azotemia secondary to hypotension. Improved and currently at baseline.   Monitor creatinine with resumption of Lasix.  Severe protein calorie malnutrition  Patient has been seen by dietitian.  TPN started 10/02/14.  Can begin to wean tomorrow if tolerates soft diet.    ALk phos elevated for unclear reasons-monitoring. Is beginning to trend down.  Pancreatic adenocarcinoma s/p Whipple  Status post Whipple's with preoperative XRT and chemotherapy.   Patient has been seen by Dr. Benay Spice of oncology and chemotherapy on hold for now. Outpatient follow-up with oncology.  Hypokalemia/hypomagnesemia  Likely secondary to diuretics. Repleted.    Deconditioning/debility  Patient has been seen by physical therapy recommended home health PT.  Prophylaxis  Protonix for GI prophylaxis. SCDs for DVT prophylaxis.  Code Status: Full. Family Communication: Husband, [name], updated at the bedside. Disposition Plan: Home when stable.   IV Access:    PICC placed 09/29/14   Procedures and diagnostic studies:   Dg Chest Port 1 View 09/14/2014: 1. Right IJ line noted ending about the mid SVC. 2. Mild vascular congestion noted. Increased interstitial  markings may reflect minimal interstitial edema.    Ct Abdomen Pelvis Wo Contrast 09/14/2014: 1. Two ovoid high-density lesions adjacent to the pancreatic stent within the jejunum could represent hemorrhage or hematoma. 2. No additional gross evidence of surgical complication on this non IV and oral contrast exam. 3. Potential small additional clot within the stomach is indeterminate.  Dg Abd Portable 2v 09/16/2014: Negative.     Ct Abdomen Pelvis W Contrast 09/16/2014: Changes consistent with prior Whipple procedure with pancreatic stent in place. Some postoperative free fluid is noted although no organized collection to suggest abscess is noted at this time.  New main portal vein thrombus which is nonocclusive but occupies a majority of the lumen of the main portal vein. There is some differential enhancement of the liver identified which in part is likely related to this thrombus and timing of the contrast bolus.  New bilateral pleural effusions with bibasilar infiltrates particularly in the right middle lobe. This may be the etiology of the patient's elevated white blood cell count. These are new from the prior exam from 09/14/2014.  Changes of anasarca.    Dg Chest Port 1 View 09/17/2014: 1. Interval development of widespread interstitial and airspace disease throughout the lungs bilaterally concerning for severe multilobar pneumonia.    Dg Chest Port 1 View 09/19/2014: 1. Right IJ line in stable position. 2. Severe dense diffuse bilateral pulmonary infiltrates, no change from prior exam .    Dg Chest Port 1 View 09/21/2014: Severe diffuse bilateral airspace disease, unchanged.     Dg Chest Port 1 View 09/22/2014: Slightly improved aeration bilaterally. Persistent interstitial pattern of infiltrates bilaterally.     Ct Abdomen Pelvis W Contrast 09/26/2014: 1. Again noted nonobstructive small amount of thrombus in main portal vein without  change from prior exam. 2. Stable postsurgical changes post  Whipple procedure. Again noted pancreatic duct stent in place. Stable heterogeneous pattern of enhancement of the liver 3. Again noted small amount of perihepatic and perisplenic ascites. Small amount of free fluid is noted in right paracolic gutter. Anasarca infiltration subcutaneous fat abdominal and pelvic wall again noted. 4. No small bowel or colonic obstruction. Moderate stool noted in right colon and sigmoid colon. No pericecal inflammation. Normal appendix. 5. Mild distended urinary bladder. 6. No hydronephrosis or hydroureter. Bilateral renal symmetrical excretion.    Dg Chest 2 View 09/28/2014: Slight improvement in mixed interstitial and airspace disease, possibly due to edema, with small bilateral effusions.    EGD 09/28/14: 1. Prior Whipple surgery with gastroenterostomy in the gastric body 2. Fiable areas at the anastomosis 3. The EGD otherwise appeared normal  Dg Chest Port 1 View 09/29/2014: 1. Right PICC line in good position. 2. Interstitial edema pattern.    2-D echocardiogram 1/32/44: Normal systolic function with an EF 55-60 percent. No regional wall motion abnormalities.  Dg Chest 1 View 10/02/2014: 1. Findings remain compatible with mildly increased interstitial edema. 2. Bibasilar consolidation, left greater than right, question developing pneumonia versus atelectasis.     Medical Consultants:    Dr. Erskine Emery, Gastroenterology   Dr. Stark Klein, Surgery  Dr. Lane Hacker, Palliative Care  Dr. Baltazar Apo, Critical Care  Anti-Infectives:    Ceftriaxone 12/25 >> 12/26  Zosyn 12/26 >>12/28  Rocephin 12/28>>> 09/26/14  Zosyn 1/6---09/28/14  Subjective:   Melinda Conrad is tolerating her diet.  No complaints of black or bloody stools.  Occasional abdominal pain.  No N/V.  Objective:    Filed Vitals:   10/04/14 1632 10/04/14 1805 10/04/14 2050 10/05/14 0557  BP: 135/67 131/60 128/76 101/56  Pulse: 64 50 60 54  Temp:  98.3 F (36.8 C) 98.6 F (37  C) 98.6 F (37 C)  TempSrc:  Oral Oral Oral  Resp: '16 18 16 18  ' Height:      Weight:      SpO2: 100% 100% 97% 98%    Intake/Output Summary (Last 24 hours) at 10/05/14 0800 Last data filed at 10/05/14 0102  Gross per 24 hour  Intake   1030 ml  Output    900 ml  Net    130 ml    Exam: Gen:  NAD Cardiovascular:  RRR, No M/R/G, hyperdynamic precordium Respiratory:  Lungs CTAB Gastrointestinal:  Abdomen soft, NT/ND, + BS Extremities:  2+ edema   Data Reviewed:    Labs: Basic Metabolic Panel:  Recent Labs Lab 09/29/14 0431 10/02/14 1015 10/03/14 0455 10/04/14 0500 10/05/14 0510  NA 136 138 135 132* 135  K 3.4* 3.5 3.7 3.6 3.6  CL 113* 116* 114* 105 104  CO2 '20 19 19 22 28  ' GLUCOSE 64* 125* 140* 147* 109*  BUN '6 9 7 6 7  ' CREATININE 0.57 0.59 0.57 0.47* 0.38*  CALCIUM 7.0* 7.0* 7.2* 7.2* 7.2*  MG  --  1.4* 1.6 1.3* 1.5  PHOS 3.2 2.6 2.9 3.4 4.1   GFR Estimated Creatinine Clearance: 67.8 mL/min (by C-G formula based on Cr of 0.38). Liver Function Tests:  Recent Labs Lab 09/29/14 0431 10/02/14 1015 10/03/14 0455 10/04/14 0500  AST  --  '20 20 18  ' ALT  --  '15 14 11  ' ALKPHOS  --  488* 470* 424*  BILITOT  --  0.7 0.5 0.4  PROT  --  5.4* 5.9* 6.0  ALBUMIN <1.0* 1.0* 1.1* 1.1*   Coagulation profile  Recent Labs Lab 09/30/14 1540 10/02/14 1640 10/03/14 0455  INR 1.51* 1.51* 1.53*    CBC:  Recent Labs Lab 09/29/14 0431 09/30/14 0515 09/30/14 1245 09/30/14 1740 10/01/14 0028 10/02/14 1640 10/03/14 0455  WBC 4.4 5.0 6.0 9.0  --  5.1 5.5  NEUTROABS 2.6 3.3  --  7.3  --  3.4 3.7  HGB 8.9* 8.8* 8.8* 7.7* 9.3* 9.1* 9.4*  HCT 26.9* 26.5* 26.6* 23.6* 27.8* 27.8* 29.0*  MCV 90.0 89.8 89.9 90.1  --  90.3 90.1  PLT 148* 134* 123* 126*  --  97* 99*   Cardiac Enzymes: No results for input(s): CKTOTAL, CKMB, CKMBINDEX, TROPONINI in the last 168 hours. CBG: Sepsis Labs:  Recent Labs Lab 09/29/14 0431  09/30/14 1245 09/30/14 1740 10/02/14 1640  10/03/14 0455  PROCALCITON 0.12  --   --   --   --   --   WBC 4.4  < > 6.0 9.0 5.1 5.5  < > = values in this interval not displayed. Microbiology Recent Results (from the past 240 hour(s))  Culture, blood (routine x 2)     Status: None   Collection Time: 09/27/14 12:40 PM  Result Value Ref Range Status   Specimen Description BLOOD RIGHT HAND  Final   Special Requests BOTTLES DRAWN AEROBIC ONLY [place]  Final   Culture   Final    NO GROWTH 5 DAYS Performed at [facility]    Report Status 10/03/2014 FINAL  Final  Culture, blood (routine x 2)     Status: None   Collection Time: 09/27/14 12:45 PM  Result Value Ref Range Status   Specimen Description BLOOD EFT ARM  Final   Special Requests BOTTLES DRAWN AEROBIC AND ANAEROBIC 3CC  Final   Culture   Final    NO GROWTH 5 DAYS Performed at [facility]    Report Status 10/03/2014 FINAL  Final     Medications:   . antiseptic oral rinse  7 mL Mouth Rinse BID  . feeding supplement (ENSURE COMPLETE)  237 mL Oral TID BM  . feeding supplement (ENSURE)  1 Container Oral TID BM  . fentaNYL  25 mcg Transdermal Q72H  . insulin aspart  0-9 Units Subcutaneous TID WC  . lidocaine  1 patch Transdermal Q24H  . pantoprazole (PROTONIX) IV  40 mg Intravenous Q12H  . potassium chloride  20 mEq Oral BID  . pregabalin  25 mg Oral QHS  . sertraline  100 mg Oral q morning - 10a  . sodium chloride  10-40 mL Intracatheter Q12H  . sucralfate  1 g Oral TID WC & HS   Continuous Infusions: . Marland KitchenTPN (CLINIMIX-E) Adult 80 mL/hr at 10/04/14 1704  . dextrose 5 % and 0.9% NaCl 1,000 mL infusion 10 mL/hr at 10/03/14 0600    Time spent: 25 minutes.     LOS: 21 days   [facility] Pager [phone]. If unable to reach me by pager, please call my cell phone at [phone].  *Please refer to [email or web address], password [email or web address] to get updated schedule on who will round on this patient, as hospitalists switch teams weekly. If 7PM-7AM,  please contact night-coverage at [email or web address], password [email or web address] for any overnight needs.  10/05/2014, 8:00 AM

## 2014-10-05 NOTE — Progress Notes (Signed)
PARENTERAL NUTRITION CONSULT NOTE   Pharmacy Consult for TPN Indication: Severe protein calorie malnutrion  No Known Allergies  Patient Measurements: Height: '5\' 3"'  (160 cm) Weight: 147 lb 7.8 oz (66.9 kg) IBW/kg (Calculated) : 52.4 Adjusted Body Weight: 56.5 kg  Vital Signs: Temp: 98.6 F (37 C) (01/15 0557) Temp Source: Oral (01/15 0557) BP: 101/56 mmHg (01/15 0557) Pulse Rate: 54 (01/15 0557) Intake/Output from previous day: 01/14 0701 - 01/15 0700 In: 1230 [P.O.:360; I.V.:120; IV Piggyback:50; TPN:700] Out: 900 [Urine:900] Intake/Output from this shift:    Labs:  Recent Labs  10/02/14 1640 10/03/14 0455  WBC 5.1 5.5  HGB 9.1* 9.4*  HCT 27.8* 29.0*  PLT 97* 99*  INR 1.51* 1.53*     Recent Labs  10/02/14 1015 10/03/14 0455 10/04/14 0500 10/05/14 0510  NA 138 135 132* 135  K 3.5 3.7 3.6 3.6  CL 116* 114* 105 104  CO2 '19 19 22 28  ' GLUCOSE 125* 140* 147* 109*  BUN '9 7 6 7  ' CREATININE 0.59 0.57 0.47* 0.38*  CALCIUM 7.0* 7.2* 7.2* 7.2*  MG 1.4* 1.6 1.3* 1.5  PHOS 2.6 2.9 3.4 4.1  PROT 5.4* 5.9* 6.0  --   ALBUMIN 1.0* 1.1* 1.1*  --   AST '20 20 18  ' --   ALT '15 14 11  ' --   ALKPHOS 488* 470* 424*  --   BILITOT 0.7 0.5 0.4  --   PREALBUMIN  --  <3.0*  --   --   TRIG  --  98  --   --    Estimated Creatinine Clearance: 67.8 mL/min (by C-G formula based on Cr of 0.38).    Recent Labs  10/04/14 1713 10/04/14 2252 10/05/14 0801  GLUCAP 136* 129* 105*    Medical History: Past Medical History  Diagnosis Date  . Arthritis   . Depression   . H/O: substance abuse   . Back pain   . Personal history of colonic polyp - adenoma 10/26/2013    10/26/2013 diminutive sigmoid polyp removed    . Pancreatitis, acute   . Hypertension   . Osteoporosis   . Radiation 04/30/14-06/08/14    pancreas 50 gray  . Cancer     pancreatic cancer   Insulin Requirements: 3 unit Novolog sensitive SSI yesterday  Current Nutrition: full liquids, orders for ensure complete and  ensure pudding TID between meals  IVF: D5NS at [facility] access: PICC TPN start date: 10/02/14  ASSESSMENT                                                                                                          HPI:  [name] with PMH of pancreatitis, HTN, pancreatic cancer with prior Whipple surgery (08/07/14) with preoperative radiation and chemotherapy admitted to ICU on 09/14/2014 with Hgb 5.8, several days of melanotic stools, fever, hematemesis, sepsis secondary to bacteremia.  Patient is s/p EGD on 1/8 with friable areas at anastomosis.  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  Pharmacy consulted today to start TPN  for this patient.    Significant events:   Today:    Glucose - CBGs at goal <119m/dl except x 1 CBG at 180. CBGs may be higher now that also taking some PO.  Electrolytes - Na = 135 (improved), Mag improved to 1.5 , Corrected Ca WNL at 9.5  Renal -SCr low, stable (given lasix 664mIV x 1 1/14)  LFTs - AST/ALT WNL, Alk Phos elevated but improving (1/14)  TGs - 98 (1/13)  Prealbumin - <3 reflects severely depleted protein stores  Diet advanced to soft bland  NUTRITIONAL GOALS                                                                 RD recs: Kcal:1600-1800 Protein: 90-100 gram Fluid: >/= 1600 ml daily Clinimix 5/15 at a goal rate of 8057mr + 20% fat emulsion MWF at 15m62m to provide: 96g/day protein, avg 1569Kcal/day.  PLAN  ______________________________________________  At 1800 today:  Continue Clinimix E 5/15 at 80 ml/hr despite diet being advanced to soft bland as prealbumin <3 and can wean down once proves able to eat solid/soft diet consistently   20% fat emulsion at 10 ml/hr on MWF  Fat emulsion only being given on MWF d/t national backorder  TPN to contain standard multivitamins and trace elements.  Continue sensitive SSI q6h..   TPN lab panels on Mondays & Thursdays.  F/u  daily.  Melinda Conrad 10/05/2014, 10:14 AM Pager [phone]

## 2014-10-05 NOTE — Progress Notes (Signed)
Patient ID: Melinda Conrad, female   DOB: 28-Feb-1951, 64 y.o.   MRN: [id number] 7 Days Post-Op   Subjective: No n/v.  No bleeding.    Objective: Vital signs in last 24 hours: Temp:  [98.3 F (36.8 C)-98.6 F (37 C)] 98.6 F (37 C) (01/15 0557) Pulse Rate:  [50-64] 54 (01/15 0557) Resp:  [16-20] 18 (01/15 0557) BP: (101-135)/(56-76) 101/56 mmHg (01/15 0557) SpO2:  [97 %-100 %] 98 % (01/15 0557) Last BM Date: 10/03/14  Intake/Output from previous day: 01/14 0701 - 01/15 0700 In: 1230 [P.O.:360; I.V.:120; IV Piggyback:50; TPN:700] Out: 900 [Urine:900] Intake/Output this shift:    Resp: breathing comfortably Cardio: regular rate and rhythm GI: soft, nontender  Lab Results:   Recent Labs  10/02/14 1640 10/03/14 0455  WBC 5.1 5.5  HGB 9.1* 9.4*  HCT 27.8* 29.0*  PLT 97* 99*   BMET  Recent Labs  10/04/14 0500 10/05/14 0510  NA 132* 135  K 3.6 3.6  CL 105 104  CO2 22 28  GLUCOSE 147* 109*  BUN 6 7  CREATININE 0.47* 0.38*  CALCIUM 7.2* 7.2*   PT/INR  Recent Labs  10/02/14 1640 10/03/14 0455  LABPROT 18.3* 18.5*  INR 1.51* 1.53*   ABG No results for input(s): PHART, HCO3 in the last 72 hours.  Invalid input(s): PCO2, PO2  Studies/Results: No results found.  Anti-infectives: Anti-infectives    Start     Dose/Rate Route Frequency Ordered Stop   09/26/14 1400  piperacillin-tazobactam (ZOSYN) IVPB 3.375 g  Status:  Discontinued     3.375 g12.5 mL/hr over 240 Minutes Intravenous 3 times per day 09/26/14 1342 09/28/14 1649   09/17/14 1200  cefTRIAXone (ROCEPHIN) 2 g in dextrose 5 % 50 mL IVPB - Premix  Status:  Discontinued     2 g100 mL/hr over 30 Minutes Intravenous Every 24 hours 09/17/14 1013 09/26/14 1315   09/15/14 1800  piperacillin-tazobactam (ZOSYN) IVPB 3.375 g  Status:  Discontinued     3.375 g12.5 mL/hr over 240 Minutes Intravenous Every 8 hours 09/15/14 1741 09/17/14 1013   09/14/14 2245  cefTRIAXone (ROCEPHIN) 1 g in dextrose 5 % 50 mL  IVPB  Status:  Discontinued     1 g100 mL/hr over 30 Minutes Intravenous Every 24 hours 09/14/14 2238 09/15/14 1730      Assessment/Plan: s/p Procedure(s): ESOPHAGOGASTRODUODENOSCOPY (EGD) (N/A) Soft diet.   Monitor blood sugar Carafate.     LOS: 21 days    Abhiraj Dozal 10/05/2014

## 2014-10-06 LAB — GLUCOSE, CAPILLARY
GLUCOSE-CAPILLARY: 112 mg/dL — AB (ref 70–99)
Glucose-Capillary: 110 mg/dL — ABNORMAL HIGH (ref 70–99)
Glucose-Capillary: 118 mg/dL — ABNORMAL HIGH (ref 70–99)
Glucose-Capillary: 114 mg/dL — ABNORMAL HIGH (ref 70–99)

## 2014-10-06 MED ORDER — TRACE MINERALS CR-CU-F-FE-I-MN-MO-SE-ZN IV SOLN
INTRAVENOUS | Status: AC
  Administered 2014-10-06: 19:00:00 via INTRAVENOUS
  Filled 2014-10-06: qty 1920

## 2014-10-06 NOTE — Progress Notes (Signed)
PARENTERAL NUTRITION CONSULT NOTE   Pharmacy Consult for TPN Indication: Severe protein calorie malnutrion  No Known Allergies  Patient Measurements: Height: '5\' 3"'  (160 cm) Weight: 135 lb 9.3 oz (61.5 kg) IBW/kg (Calculated) : 52.4 Adjusted Body Weight: 56.5 kg  Vital Signs: Temp: 98.9 F (37.2 C) (01/16 0611) Temp Source: Oral (01/16 3254) BP: 110/74 mmHg (01/16 0611) Pulse Rate: 66 (01/16 0611) Intake/Output from previous day: 01/15 0701 - 01/16 0700 In: 480 [P.O.:480] Out: -  Intake/Output from this shift:    Labs: No results for input(s): WBC, HGB, HCT, PLT, APTT, INR in the last 72 hours.   Recent Labs  10/04/14 0500 10/05/14 0510  NA 132* 135  K 3.6 3.6  CL 105 104  CO2 22 28  GLUCOSE 147* 109*  BUN 6 7  CREATININE 0.47* 0.38*  CALCIUM 7.2* 7.2*  MG 1.3* 1.5  PHOS 3.4 4.1  PROT 6.0  --   ALBUMIN 1.1*  --   AST 18  --   ALT 11  --   ALKPHOS 424*  --   BILITOT 0.4  --    Estimated Creatinine Clearance: 61.1 mL/min (by C-G formula based on Cr of 0.38).    Recent Labs  10/05/14 1807 10/05/14 2135 10/06/14 0730  GLUCAP 93 105* 112*   Insulin Requirements: 0 unit Novolog sensitive SSI yesterday  Current Nutrition:  Full liquids Ensure complete and ensure pudding TID between meals Clinimix E 5/15 at 80 ml/hr 20% Lipids at 10 ml/hr  IVF: D5NS at [facility] access: PICC TPN start date: 10/02/14  ASSESSMENT                                                                                                          HPI:  89 y/oF with PMH of pancreatitis, HTN, pancreatic cancer with prior Whipple surgery (08/07/14) with preoperative radiation and chemotherapy admitted to ICU on 09/14/2014 with Hgb 5.8, several days of melanotic stools, fever, hematemesis, sepsis secondary to bacteremia.  Patient is s/p EGD on 1/8 with friable areas at anastomosis.  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  Pharmacy  consulted today to start TPN for this patient.    Significant events:  1/15: Advanced to soft diet, tolerating some - ate grits this morning. May be able to wean TPN soon.  Today:  10/06/2014   Glucose - CBGs at goal <170m/dl   Electrolytes (1/15) - Na = 135 (improved), Mag improved to 1.5 , Corrected Ca WNL at 9.5  Renal -SCr low, stable (given lasix 680mIV x 1 1/14)  LFTs - AST/ALT WNL, Alk Phos elevated but improving (1/14)  TGs - 98 (1/13)  Prealbumin - <3 reflects severely depleted protein stores  Diet advanced to soft bland  NUTRITIONAL GOALS  RD recs: [id number] Protein: 90-100 gram Fluid: >/= 1600 ml daily Clinimix 5/15 at a goal rate of 68m/hr + 20% fat emulsion MWF at 151mhr to provide: 96g/day protein, avg 1569Kcal/day.  PLAN  ______________________________________________  At 1800 today:  Continue Clinimix E 5/15 at 80 ml/hr despite diet being advanced to soft bland as prealbumin <3 and can wean down once proves able to eat solid/soft diet consistently   20% fat emulsion at 10 ml/hr on MWF  Fat emulsion only being given on MWF d/t national backorder  TPN to contain standard multivitamins and trace elements.  Continue sensitive SSI q6h..   TPN lab panels on Mondays & Thursdays.  F/u daily. Await ok from surgery if can begin weaning TPN.  ErPeggyann JubaPharmD, BCPS Pager: [phone]/16/2016, 11:01 AM

## 2014-10-06 NOTE — Progress Notes (Signed)
Progress Note   Melinda Conrad ENM:[id number] DOB: September 12, 1951 DOA: 09/14/2014 PCP: Kevan Ny, MD   Brief Narrative:   Melinda Conrad is an 64 y.o. female with history of pancreatitis, hypertension, pancreatic cancer with prior Whipple surgery (08/07/14) with preoperative radiation and chemotherapy. Admitted to ICU12/25/2015 with a hemoglobin of 5.8. And a complaint of several days of melanotic stools, fever up to 104.4F, and hematemesis.   09/14/14 CT abd/pelvis showed two ovoid high-density lesions adjacent to the pancreatic stent within the jejunum could represent hemorrhage or hematoma. No additional gross evidence of surgical complication. Gastroenterology was consulted.the patient was placed on an IV Protonix drip. Initial ?GI bleed was due to chronic NSAID use vs ischemia induced from her hypotension. EGD was deferred because of the patient's persistent hypotension and clincial instability at the time of sentinel bleed.  Blood cultures performed at the time of admission revealed Klebsiella pneumoniae. The patient was initially started on Zosyn, but this was narrowed to ceftriaxone and subsequently d/c altoghter 09/28/14.  CT abd/pelvis for abd pain 09/16/2014 with new portal vein thrombus, bilateral pleural effusions with bibasilar infiltrates .Gen. surgery was consulted. After careful consideration amongst all consultants due to the patient's recent GI bleed, the patient was started on a heparin drip.   The patient continued to have progressive thrombocytopenia. Heme/Onc, Dr. Benay Spice was consulted. The patient's heparin drip was subsequently held with the hopes that with her thrombocytopenia will improved with treatment for sepsis. Thrombocytopenia improving.   Unfortunately hemoglobin dropped 9-->6.7, patient complained of left flank pain not relieved by IV pain medications. Persistently hypotensive over the course of 1/5-1/6 therefore transferred back to step down unit.  Heparin completely discontinued because of new onset anemia She devleoped 2 further epiosdes of N/V of BRB on 09/30/13 and Gen surgery and GI input were sought.  Unclear etilogy of Abd pain- might represent extension of Portal venous thrombosis?  She has reached another plateau wherein she has stopped having any GI bleed as of 10/02/14, however now the ? Is when is it truly safe to feed her and whether she will recover completely from this situation?   A limited discussion was undertaken regarding what options we have moving forward and Surg-Onc and GI are to weigh in if patient re-bleeds again and I have discussed with the patient that we will need to come to a decision about what to do if patient suffers from recurrent bleeding.  Assessment/Plan:   Principal problem: Severe sepsis/Klebsiella pneumonia bacteremia  Patient was admitted with sepsis with Klebsiella bacteremia requiring pressor support. CXR compatible with PNA.  Patient improved/stable clinically.  Status post a full treatment course with Rocephin--->Zosyn with antibiotics discontinued on 09/28/14.  Cultures from 09/19/14 and 09/27/14, negative.   CT scan abdomen pelvis 09/16/14 = bibasilar infiltrates right middle lobe-new from chest x-ray 09/14/14.  Active problems:  Hemorrhagic shock/acute blood loss anemia from Staple line of Whipple on Endoscopy 1/8 + s/p Whipple T3N1   Endoscopy 1/8=Friable anastamosis with bleeding.  Re-bled again 09/30/14.  Patient status post 4 units packed red blood cells, with last transfusion given 09/27/14.  Pantoprazole infusion initiated 09/30/14-stopped 10/03/14. Continue Protonix twice a day.  Octreotide infusion initiated 09/30/14-stopped 10/03/14.  Advanced to soft per surgery on 10/05/14.    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  Platelet count improving. For now heparin/Lovenox remain on hold.  Acute  respiratory failure  Resolved.  Concern for TRALI secondary to transfusions versus  acute lung injury from bacteremia.   Patient Net +17 L since admission. Continue to gently diurese. Monitor daily weights (weight significantly lower today). Continue Lasix 60 mg IV daily.  Respiratory status stable.  Thrombocytopenia secondary to DIC and massive ABLA-Nadir = PLT 21 on 09/17/14  Likely secondary to sepsis with possible DIC and consumption.   Current PLT count stable. Platelet function showing recovery and improving.   Acute kidney injury  Secondary to sepsis and pre-azotemia secondary to hypotension. Improved and currently at baseline.   Monitor creatinine with resumption of Lasix.  Severe protein calorie malnutrition  Patient has been seen by dietitian.  TPN started 10/02/14.  Can begin to wean if OK with surgery.    ALk phos elevated for unclear reasons-monitoring. Is beginning to trend down.  Pancreatic adenocarcinoma s/p Whipple  Status post Whipple's with preoperative XRT and chemotherapy.   Patient has been seen by Dr. Benay Spice of oncology and chemotherapy on hold for now. Outpatient follow-up with oncology.  Hypokalemia/hypomagnesemia  Likely secondary to diuretics. Repleted.    Deconditioning/debility  Patient has been seen by physical therapy recommended home health PT.  Prophylaxis  Protonix for GI prophylaxis. SCDs for DVT prophylaxis.  Code Status: Full. Family Communication: Husband, [name], updated at the bedside. Disposition Plan: Home when stable.   IV Access:    PICC placed 09/29/14   Procedures and diagnostic studies:   Dg Chest Port 1 View 09/14/2014: 1. Right IJ line noted ending about the mid SVC. 2. Mild vascular congestion noted. Increased interstitial markings may reflect minimal interstitial edema.    Ct Abdomen Pelvis Wo Contrast 09/14/2014: 1. Two ovoid high-density lesions adjacent to the pancreatic stent within the jejunum could  represent hemorrhage or hematoma. 2. No additional gross evidence of surgical complication on this non IV and oral contrast exam. 3. Potential small additional clot within the stomach is indeterminate.  Dg Abd Portable 2v 09/16/2014: Negative.     Ct Abdomen Pelvis W Contrast 09/16/2014: Changes consistent with prior Whipple procedure with pancreatic stent in place. Some postoperative free fluid is noted although no organized collection to suggest abscess is noted at this time.  New main portal vein thrombus which is nonocclusive but occupies a majority of the lumen of the main portal vein. There is some differential enhancement of the liver identified which in part is likely related to this thrombus and timing of the contrast bolus.  New bilateral pleural effusions with bibasilar infiltrates particularly in the right middle lobe. This may be the etiology of the patient's elevated white blood cell count. These are new from the prior exam from 09/14/2014.  Changes of anasarca.    Dg Chest Port 1 View 09/17/2014: 1. Interval development of widespread interstitial and airspace disease throughout the lungs bilaterally concerning for severe multilobar pneumonia.    Dg Chest Port 1 View 09/19/2014: 1. Right IJ line in stable position. 2. Severe dense diffuse bilateral pulmonary infiltrates, no change from prior exam .    Dg Chest Port 1 View 09/21/2014: Severe diffuse bilateral airspace disease, unchanged.     Dg Chest Port 1 View 09/22/2014: Slightly improved aeration bilaterally. Persistent interstitial pattern of infiltrates bilaterally.     Ct Abdomen Pelvis W Contrast 09/26/2014: 1. Again noted nonobstructive small amount of thrombus in main portal vein without change from prior exam. 2. Stable postsurgical changes post Whipple procedure. Again noted pancreatic duct stent in place. Stable heterogeneous pattern of enhancement of the liver 3. Again noted small amount of  perihepatic and perisplenic ascites.  Small amount of free fluid is noted in right paracolic gutter. Anasarca infiltration subcutaneous fat abdominal and pelvic wall again noted. 4. No small bowel or colonic obstruction. Moderate stool noted in right colon and sigmoid colon. No pericecal inflammation. Normal appendix. 5. Mild distended urinary bladder. 6. No hydronephrosis or hydroureter. Bilateral renal symmetrical excretion.    Dg Chest 2 View 09/28/2014: Slight improvement in mixed interstitial and airspace disease, possibly due to edema, with small bilateral effusions.    EGD 09/28/14: 1. Prior Whipple surgery with gastroenterostomy in the gastric body 2. Fiable areas at the anastomosis 3. The EGD otherwise appeared normal  Dg Chest Port 1 View 09/29/2014: 1. Right PICC line in good position. 2. Interstitial edema pattern.    2-D echocardiogram 03/09/49: Normal systolic function with an EF 55-60 percent. No regional wall motion abnormalities.  Dg Chest 1 View 10/02/2014: 1. Findings remain compatible with mildly increased interstitial edema. 2. Bibasilar consolidation, left greater than right, question developing pneumonia versus atelectasis.     Medical Consultants:    Dr. Erskine Emery, Gastroenterology   Dr. Stark Klein, Surgery  Dr. Lane Hacker, Palliative Care  Dr. Baltazar Apo, Critical Care  Anti-Infectives:    Ceftriaxone 12/25 >> 12/26  Zosyn 12/26 >>12/28  Rocephin 12/28>>> 09/26/14  Zosyn 1/6---09/28/14  Subjective:   Melinda Conrad is tolerating her diet.  No complaints of black or bloody stools.  No N/V.  Sleepy today.  Objective:    Filed Vitals:   10/05/14 1827 10/05/14 1910 10/05/14 2136 10/06/14 0611  BP: 134/64  142/66 110/74  Pulse: 56  65 66  Temp:   100 F (37.8 C) 98.9 F (37.2 C)  TempSrc:   Oral Oral  Resp: '18  20 16  ' Height:      Weight:  65.6 kg (144 lb 10 oz)  61.5 kg (135 lb 9.3 oz)  SpO2: 100%  98% 98%    Intake/Output Summary (Last 24 hours) at 10/06/14 0820 Last  data filed at 10/05/14 1912  Gross per 24 hour  Intake    480 ml  Output      0 ml  Net    480 ml    Exam: Gen:  NAD Cardiovascular:  RRR, No M/R/G, hyperdynamic precordium Respiratory:  Lungs CTAB Gastrointestinal:  Abdomen soft, NT/ND, + BS Extremities:  2+ edema   Data Reviewed:    Labs: Basic Metabolic Panel:  Recent Labs Lab 10/02/14 1015 10/03/14 0455 10/04/14 0500 10/05/14 0510  NA 138 135 132* 135  K 3.5 3.7 3.6 3.6  CL 116* 114* 105 104  CO2 '19 19 22 28  ' GLUCOSE 125* 140* 147* 109*  BUN '9 7 6 7  ' CREATININE 0.59 0.57 0.47* 0.38*  CALCIUM 7.0* 7.2* 7.2* 7.2*  MG 1.4* 1.6 1.3* 1.5  PHOS 2.6 2.9 3.4 4.1   GFR Estimated Creatinine Clearance: 61.1 mL/min (by C-G formula based on Cr of 0.38). Liver Function Tests:  Recent Labs Lab 10/02/14 1015 10/03/14 0455 10/04/14 0500  AST '20 20 18  ' ALT '15 14 11  ' ALKPHOS 488* 470* 424*  BILITOT 0.7 0.5 0.4  PROT 5.4* 5.9* 6.0  ALBUMIN 1.0* 1.1* 1.1*   Coagulation profile  Recent Labs Lab 09/30/14 1540 10/02/14 1640 10/03/14 0455  INR 1.51* 1.51* 1.53*    CBC:  Recent Labs Lab 09/30/14 0515 09/30/14 1245 09/30/14 1740 10/01/14 0028 10/02/14 1640 10/03/14 0455  WBC 5.0 6.0 9.0  --  5.1 5.5  NEUTROABS 3.3  --  7.3  --  3.4 3.7  HGB 8.8* 8.8* 7.7* 9.3* 9.1* 9.4*  HCT 26.5* 26.6* 23.6* 27.8* 27.8* 29.0*  MCV 89.8 89.9 90.1  --  90.3 90.1  PLT 134* 123* 126*  --  97* 99*   Cardiac Enzymes: No results for input(s): CKTOTAL, CKMB, CKMBINDEX, TROPONINI in the last 168 hours. CBG: Sepsis Labs:  Recent Labs Lab 09/30/14 1245 09/30/14 1740 10/02/14 1640 10/03/14 0455  WBC 6.0 9.0 5.1 5.5   Microbiology Recent Results (from the past 240 hour(s))  Culture, blood (routine x 2)     Status: None   Collection Time: 09/27/14 12:40 PM  Result Value Ref Range Status   Specimen Description BLOOD RIGHT HAND  Final   Special Requests BOTTLES DRAWN AEROBIC ONLY [place]  Final   Culture   Final    NO  GROWTH 5 DAYS Performed at [facility]    Report Status 10/03/2014 FINAL  Final  Culture, blood (routine x 2)     Status: None   Collection Time: 09/27/14 12:45 PM  Result Value Ref Range Status   Specimen Description BLOOD EFT ARM  Final   Special Requests BOTTLES DRAWN AEROBIC AND ANAEROBIC 3CC  Final   Culture   Final    NO GROWTH 5 DAYS Performed at [facility]    Report Status 10/03/2014 FINAL  Final     Medications:   . antiseptic oral rinse  7 mL Mouth Rinse BID  . feeding supplement (ENSURE COMPLETE)  237 mL Oral TID BM  . feeding supplement (ENSURE)  1 Container Oral TID BM  . fentaNYL  25 mcg Transdermal Q72H  . insulin aspart  0-9 Units Subcutaneous TID WC  . lidocaine  1 patch Transdermal Q24H  . pantoprazole (PROTONIX) IV  40 mg Intravenous Q12H  . potassium chloride  20 mEq Oral BID  . pregabalin  25 mg Oral QHS  . sertraline  100 mg Oral q morning - 10a  . sodium chloride  10-40 mL Intracatheter Q12H  . sucralfate  1 g Oral TID WC & HS   Continuous Infusions: . dextrose 5 % and 0.9% NaCl 10 mL/hr at 10/05/14 1253  . Marland KitchenTPN (CLINIMIX-E) Adult 80 mL/hr at 10/05/14 1749   And  . fat emulsion 240 mL (10/05/14 1749)    Time spent: 25 minutes.     LOS: 22 days   [facility] Pager [phone]. If unable to reach me by pager, please call my cell phone at [phone].  *Please refer to [email or web address], password [email or web address] to get updated schedule on who will round on this patient, as hospitalists switch teams weekly. If 7PM-7AM, please contact night-coverage at [email or web address], password [email or web address] for any overnight needs.  10/06/2014, 8:20 AM

## 2014-10-07 LAB — GLUCOSE, CAPILLARY
GLUCOSE-CAPILLARY: 118 mg/dL — AB (ref 70–99)
Glucose-Capillary: 111 mg/dL — ABNORMAL HIGH (ref 70–99)
Glucose-Capillary: 111 mg/dL — ABNORMAL HIGH (ref 70–99)
Glucose-Capillary: 129 mg/dL — ABNORMAL HIGH (ref 70–99)

## 2014-10-07 LAB — BASIC METABOLIC PANEL
Anion gap: 3 — ABNORMAL LOW (ref 5–15)
BUN: 8 mg/dL (ref 6–23)
CALCIUM: 7.7 mg/dL — AB (ref 8.4–10.5)
CO2: 29 mmol/L (ref 19–32)
CREATININE: 0.39 mg/dL — AB (ref 0.50–1.10)
Chloride: 103 mEq/L (ref 96–112)
GLUCOSE: 92 mg/dL (ref 70–99)
Potassium: 4 mmol/L (ref 3.5–5.1)
Sodium: 135 mmol/L (ref 135–145)

## 2014-10-07 LAB — PHOSPHORUS: PHOSPHORUS: 5.1 mg/dL — AB (ref 2.3–4.6)

## 2014-10-07 LAB — MAGNESIUM: Magnesium: 1.6 mg/dL (ref 1.5–2.5)

## 2014-10-07 MED ORDER — TRACE MINERALS CR-CU-F-FE-I-MN-MO-SE-ZN IV SOLN
INTRAVENOUS | Status: DC
  Administered 2014-10-07: 18:00:00 via INTRAVENOUS
  Filled 2014-10-07 (×2): qty 960

## 2014-10-07 NOTE — Progress Notes (Signed)
[facility] NOTE   Pharmacy Consult for TPN Indication: Severe protein calorie malnutrion  No Known Allergies  Patient Measurements: Height: _0  (160 cm) Weight: 126 lb 15.8 oz (57.6 kg) IBW/kg (Calculated) : 52.4 Adjusted Body Weight: 56.5 kg  Vital Signs: Temp: 98 F (36.7 C) (01/17 0500) Temp Source: Oral (01/17 0500) BP: 150/63 mmHg (01/17 0500) Pulse Rate: 67 (01/17 0500) Intake/Output from previous day: 01/16 0701 - 01/17 0700 In: 971.2 [P.O.:680; I.V.:291.2] Out: -  Intake/Output from this shift:    Labs: No results for input(s): WBC, HGB, HCT, PLT, APTT, INR in the last 72 hours.   Recent Labs  10/05/14 0510 10/07/14 0444  NA 135 135  K 3.6 4.0  CL 104 103  CO2 28 29  GLUCOSE 109* 92  BUN 7 8  CREATININE 0.38* 0.39*  CALCIUM 7.2* 7.7*  MG 1.5 1.6  PHOS 4.1 5.1*   Estimated Creatinine Clearance: 61.1 mL/min (by C-G formula based on Cr of 0.39).    Recent Labs  10/06/14 1607 10/06/14 2154 10/07/14 0805  GLUCAP 118* 114* 111*   Insulin Requirements: 0 unit Novolog sensitive SSI yesterday  Current Nutrition:  Soft diet as of 1/15 Ensure complete and ensure pudding TID between meals Clinimix E 5/15 at 80 ml/hr 20% Lipids at 10 ml/hr  IVF: D5NS at [facility] access: PICC TPN start date: 10/02/14  ASSESSMENT                                                                                                          HPI:  71 y/oF with PMH of pancreatitis, HTN, pancreatic cancer with prior Whipple surgery (08/07/14) with preoperative radiation and chemotherapy admitted to ICU on 09/14/2014 with Hgb 5.8, several days of melanotic stools, fever, hematemesis, sepsis secondary to bacteremia.  Patient is s/p EGD on 1/8 with friable areas at anastomosis.  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  Pharmacy consulted today to start TPN for this patient.    Significant events:  1/15:  Advanced to soft diet, tolerating some - ate grits this morning. May be able to wean TPN soon. 1/16: Order received to begin weaning TPN if ok with Dr. Barry Dienes (she is not on service until Monday 1/18)  Today:  10/07/2014   Glucose: CBGs at goal <174m/dl   Electrolytes: all WNL except Phos elevated (5.1). K+ remains WNL on oral KDur 20 meq BID.  Renal: SCr low, stable   LFTs:  AST/ALT WNL, Alk Phos elevated but improving (1/14)  TGs: 98 (1/13), recheck in AM  Prealbumin: <3 reflects severely depleted protein stores  NUTRITIONAL GOALS                                                                 RD recs: Kcal:1600-1800 Protein: 90-100 gram Fluid: >/=  1600 ml daily Clinimix 5/15 at a goal rate of 16m/hr + 20% fat emulsion MWF at 162mhr to provide: 96g/day protein, avg 1569Kcal/day.  PLAN  ______________________________________________  At 1800 today:  Change to Clinimix 5/20 (electrolyte-free formulation due to elevated phosphorus) and decrease rate to 40 ml/hr - follow up with Dr. ByBarry Dienesomorrow to discuss possible dc TPN   20% fat emulsion at 10 ml/hr on MWF  Fat emulsion only being given on MWF d/t national backorder  TPN to contain standard multivitamins and trace elements.  Continue sensitive SSI, change to ACHS.   TPN lab panels on Mondays & Thursdays.  F/u daily.  ErPeggyann JubaPharmD, BCPS Pager: [phone]/17/2016, 10:26 AM

## 2014-10-07 NOTE — Progress Notes (Signed)
Progress Note   Melinda Conrad HYH:[id number] DOB: 1951-09-12 DOA: 09/14/2014 PCP: Kevan Ny, MD   Brief Narrative:   Melinda Conrad is an 64 y.o. female with history of pancreatitis, hypertension, pancreatic cancer with prior Whipple surgery (08/07/14) with preoperative radiation and chemotherapy. Admitted to ICU12/25/2015 with a hemoglobin of 5.8. And a complaint of several days of melanotic stools, fever up to 104.57F, and hematemesis.   09/14/14 CT abd/pelvis showed two ovoid high-density lesions adjacent to the pancreatic stent within the jejunum could represent hemorrhage or hematoma. No additional gross evidence of surgical complication. Gastroenterology was consulted.the patient was placed on an IV Protonix drip. Initial ?GI bleed was due to chronic NSAID use vs ischemia induced from her hypotension. EGD was deferred because of the patient's persistent hypotension and clincial instability at the time of sentinel bleed.  Blood cultures performed at the time of admission revealed Klebsiella pneumoniae. The patient was initially started on Zosyn, but this was narrowed to ceftriaxone and subsequently d/c altoghter 09/28/14.  CT abd/pelvis for abd pain 09/16/2014 with new portal vein thrombus, bilateral pleural effusions with bibasilar infiltrates .Gen. surgery was consulted. After careful consideration amongst all consultants due to the patient's recent GI bleed, the patient was started on a heparin drip.   The patient continued to have progressive thrombocytopenia. Heme/Onc, Dr. Benay Spice was consulted. The patient's heparin drip was subsequently held with the hopes that with her thrombocytopenia will improved with treatment for sepsis. Thrombocytopenia improving.   Unfortunately hemoglobin dropped 9-->6.7, patient complained of left flank pain not relieved by IV pain medications. Persistently hypotensive over the course of 1/5-1/6 therefore transferred back to step down unit.  Heparin completely discontinued because of new onset anemia She devleoped 2 further epiosdes of N/V of BRB on 09/30/13 and Gen surgery and GI input were sought.  Unclear etilogy of Abd pain- might represent extension of Portal venous thrombosis?  She has reached another plateau wherein she has stopped having any GI bleed as of 10/02/14, however now the ? Is when is it truly safe to feed her and whether she will recover completely from this situation?   A limited discussion was undertaken regarding what options we have moving forward and Surg-Onc and GI are to weigh in if patient re-bleeds again and I have discussed with the patient that we will need to come to a decision about what to do if patient suffers from recurrent bleeding.  Assessment/Plan:   Principal problem: Severe sepsis/Klebsiella pneumonia bacteremia  Patient was admitted with sepsis with Klebsiella bacteremia requiring pressor support. CXR compatible with PNA.  Patient improved/stable clinically.  Status post a full treatment course with Rocephin--->Zosyn with antibiotics discontinued on 09/28/14.  Cultures from 09/19/14 and 09/27/14, negative.   CT scan abdomen pelvis 09/16/14 = bibasilar infiltrates right middle lobe-new from chest x-ray 09/14/14.  Active problems:  Hemorrhagic shock/acute blood loss anemia from Staple line of Whipple on Endoscopy 1/8 + s/p Whipple T3N1   Endoscopy 1/8=Friable anastamosis with bleeding.  Re-bled again 09/30/14.  Patient status post 4 units packed red blood cells, with last transfusion given 09/27/14.  Pantoprazole infusion initiated 09/30/14-stopped 10/03/14. Continue Protonix twice a day.  Octreotide infusion initiated 09/30/14-stopped 10/03/14.  Advanced to soft per surgery on 10/05/14.    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  Platelet count improving. For now heparin/Lovenox remain on hold.  Acute  respiratory failure  Resolved.  Concern for TRALI secondary to transfusions versus  acute lung injury from bacteremia.   Patient Net +17 L since admission, but weight coming down from a high of 66.9 kg (currently 57.6 kg/was 50.7 kg on admission).  Respiratory status stable.  Thrombocytopenia secondary to DIC and massive ABLA-Nadir = PLT 21 on 09/17/14  Likely secondary to sepsis with possible DIC and consumption.   Current PLT count stable. Platelet function showing recovery and improving.   Acute kidney injury  Secondary to sepsis and pre-azotemia secondary to hypotension. Improved and currently at baseline.   Creatinine stable with resumption of Lasix.  Severe protein calorie malnutrition  Patient has been seen by dietitian.  TPN started 10/02/14.  Weaning.  ALk phos elevated for unclear reasons-monitoring. Is beginning to trend down.  Pancreatic adenocarcinoma s/p Whipple  Status post Whipple's with preoperative XRT and chemotherapy.   Patient has been seen by Dr. Benay Spice of oncology and chemotherapy on hold for now. Outpatient follow-up with oncology.  Hypokalemia/hypomagnesemia  Likely secondary to diuretics. Repleted.    Deconditioning/debility  Patient has been seen by physical therapy recommended home health PT.  Prophylaxis  Protonix for GI prophylaxis. SCDs for DVT prophylaxis.  Code Status: Full. Family Communication: Husband, [name], updated at the bedside. Disposition Plan: Home when stable.   IV Access:    PICC placed 09/29/14   Procedures and diagnostic studies:   Dg Chest Port 1 View 09/14/2014: 1. Right IJ line noted ending about the mid SVC. 2. Mild vascular congestion noted. Increased interstitial markings may reflect minimal interstitial edema.    Ct Abdomen Pelvis Wo Contrast 09/14/2014: 1. Two ovoid high-density lesions adjacent to the pancreatic stent within the jejunum could represent hemorrhage or hematoma. 2. No additional gross  evidence of surgical complication on this non IV and oral contrast exam. 3. Potential small additional clot within the stomach is indeterminate.  Dg Abd Portable 2v 09/16/2014: Negative.     Ct Abdomen Pelvis W Contrast 09/16/2014: Changes consistent with prior Whipple procedure with pancreatic stent in place. Some postoperative free fluid is noted although no organized collection to suggest abscess is noted at this time.  New main portal vein thrombus which is nonocclusive but occupies a majority of the lumen of the main portal vein. There is some differential enhancement of the liver identified which in part is likely related to this thrombus and timing of the contrast bolus.  New bilateral pleural effusions with bibasilar infiltrates particularly in the right middle lobe. This may be the etiology of the patient's elevated white blood cell count. These are new from the prior exam from 09/14/2014.  Changes of anasarca.    Dg Chest Port 1 View 09/17/2014: 1. Interval development of widespread interstitial and airspace disease throughout the lungs bilaterally concerning for severe multilobar pneumonia.    Dg Chest Port 1 View 09/19/2014: 1. Right IJ line in stable position. 2. Severe dense diffuse bilateral pulmonary infiltrates, no change from prior exam .    Dg Chest Port 1 View 09/21/2014: Severe diffuse bilateral airspace disease, unchanged.     Dg Chest Port 1 View 09/22/2014: Slightly improved aeration bilaterally. Persistent interstitial pattern of infiltrates bilaterally.     Ct Abdomen Pelvis W Contrast 09/26/2014: 1. Again noted nonobstructive small amount of thrombus in main portal vein without change from prior exam. 2. Stable postsurgical changes post Whipple procedure. Again noted pancreatic duct stent in place. Stable heterogeneous pattern of enhancement of the liver 3. Again noted small amount of perihepatic and perisplenic ascites. Small amount of free fluid  is noted in right paracolic  gutter. Anasarca infiltration subcutaneous fat abdominal and pelvic wall again noted. 4. No small bowel or colonic obstruction. Moderate stool noted in right colon and sigmoid colon. No pericecal inflammation. Normal appendix. 5. Mild distended urinary bladder. 6. No hydronephrosis or hydroureter. Bilateral renal symmetrical excretion.    Dg Chest 2 View 09/28/2014: Slight improvement in mixed interstitial and airspace disease, possibly due to edema, with small bilateral effusions.    EGD 09/28/14: 1. Prior Whipple surgery with gastroenterostomy in the gastric body 2. Fiable areas at the anastomosis 3. The EGD otherwise appeared normal  Dg Chest Port 1 View 09/29/2014: 1. Right PICC line in good position. 2. Interstitial edema pattern.    2-D echocardiogram 1/61/09: Normal systolic function with an EF 55-60 percent. No regional wall motion abnormalities.  Dg Chest 1 View 10/02/2014: 1. Findings remain compatible with mildly increased interstitial edema. 2. Bibasilar consolidation, left greater than right, question developing pneumonia versus atelectasis.     Medical Consultants:    Dr. Erskine Emery, Gastroenterology   Dr. Stark Klein, Surgery  Dr. Lane Hacker, Palliative Care  Dr. Baltazar Apo, Critical Care  Anti-Infectives:    Ceftriaxone 12/25 >> 12/26  Zosyn 12/26 >>12/28  Rocephin 12/28>>> 09/26/14  Zosyn 1/6---09/28/14  Subjective:   [name] is tolerating her diet.  Bowels moving, brown stool, no black or bloody stools.  Abdominal pain, rated 5/10, persists.  No N/V.  Objective:    Filed Vitals:   10/06/14 0611 10/06/14 1410 10/06/14 2200 10/07/14 0500  BP: 110/74 147/63 150/76 150/63  Pulse: 66 68 64 67  Temp: 98.9 F (37.2 C) 99.2 F (37.3 C) 98.1 F (36.7 C) 98 F (36.7 C)  TempSrc: Oral Oral Oral Oral  Resp: '16 18 18   ' Height:      Weight: 61.5 kg (135 lb 9.3 oz)   57.6 kg (126 lb 15.8 oz)  SpO2: 98% 99% 100% 96%    Intake/Output Summary  (Last 24 hours) at 10/07/14 1143 Last data filed at 10/06/14 1800  Gross per 24 hour  Intake 731.17 ml  Output      0 ml  Net 731.17 ml    Exam: Gen:  NAD Cardiovascular:  RRR, No M/R/G, hyperdynamic precordium Respiratory:  Lungs CTAB Gastrointestinal:  Abdomen soft, NT/ND, + BS Extremities:  2+ edema   Data Reviewed:    Labs: Basic Metabolic Panel:  Recent Labs Lab 10/02/14 1015 10/03/14 0455 10/04/14 0500 10/05/14 0510 10/07/14 0444  NA 138 135 132* 135 135  K 3.5 3.7 3.6 3.6 4.0  CL 116* 114* 105 104 103  CO2 '19 19 22 28 29  ' GLUCOSE 125* 140* 147* 109* 92  BUN '9 7 6 7 8  ' CREATININE 0.59 0.57 0.47* 0.38* 0.39*  CALCIUM 7.0* 7.2* 7.2* 7.2* 7.7*  MG 1.4* 1.6 1.3* 1.5 1.6  PHOS 2.6 2.9 3.4 4.1 5.1*   GFR Estimated Creatinine Clearance: 61.1 mL/min (by C-G formula based on Cr of 0.39). Liver Function Tests:  Recent Labs Lab 10/02/14 1015 10/03/14 0455 10/04/14 0500  AST '20 20 18  ' ALT '15 14 11  ' ALKPHOS 488* 470* 424*  BILITOT 0.7 0.5 0.4  PROT 5.4* 5.9* 6.0  ALBUMIN 1.0* 1.1* 1.1*   Coagulation profile  Recent Labs Lab 09/30/14 1540 10/02/14 1640 10/03/14 0455  INR 1.51* 1.51* 1.53*    CBC:  Recent Labs Lab 09/30/14 1245 09/30/14 1740 10/01/14 0028 10/02/14 1640 10/03/14 0455  WBC 6.0 9.0  --  5.1 5.5  NEUTROABS  --  7.3  --  3.4 3.7  HGB 8.8* 7.7* 9.3* 9.1* 9.4*  HCT 26.6* 23.6* 27.8* 27.8* 29.0*  MCV 89.9 90.1  --  90.3 90.1  PLT 123* 126*  --  97* 99*   Cardiac Enzymes: No results for input(s): CKTOTAL, CKMB, CKMBINDEX, TROPONINI in the last 168 hours. CBG: Sepsis Labs:  Recent Labs Lab 09/30/14 1245 09/30/14 1740 10/02/14 1640 10/03/14 0455  WBC 6.0 9.0 5.1 5.5   Microbiology Recent Results (from the past 240 hour(s))  Culture, blood (routine x 2)     Status: None   Collection Time: 09/27/14 12:40 PM  Result Value Ref Range Status   Specimen Description BLOOD RIGHT HAND  Final   Special Requests BOTTLES DRAWN  AEROBIC ONLY [place]  Final   Culture   Final    NO GROWTH 5 DAYS Performed at [facility]    Report Status 10/03/2014 FINAL  Final  Culture, blood (routine x 2)     Status: None   Collection Time: 09/27/14 12:45 PM  Result Value Ref Range Status   Specimen Description BLOOD EFT ARM  Final   Special Requests BOTTLES DRAWN AEROBIC AND ANAEROBIC 3CC  Final   Culture   Final    NO GROWTH 5 DAYS Performed at [facility]    Report Status 10/03/2014 FINAL  Final     Medications:   . antiseptic oral rinse  7 mL Mouth Rinse BID  . feeding supplement (ENSURE COMPLETE)  237 mL Oral TID BM  . feeding supplement (ENSURE)  1 Container Oral TID BM  . fentaNYL  25 mcg Transdermal Q72H  . insulin aspart  0-9 Units Subcutaneous TID WC  . lidocaine  1 patch Transdermal Q24H  . pantoprazole (PROTONIX) IV  40 mg Intravenous Q12H  . potassium chloride  20 mEq Oral BID  . pregabalin  25 mg Oral QHS  . sertraline  100 mg Oral q morning - 10a  . sodium chloride  10-40 mL Intracatheter Q12H  . sucralfate  1 g Oral TID WC & HS   Continuous Infusions: . Marland KitchenTPN (CLINIMIX-E) Adult    . dextrose 5 % and 0.9% NaCl 10 mL/hr at 10/05/14 1253  . Marland KitchenTPN (CLINIMIX-E) Adult 80 mL/hr at 10/06/14 1831    Time spent: 25 minutes.     LOS: 23 days   [facility] Pager [phone]. If unable to reach me by pager, please call my cell phone at [phone].  *Please refer to [email or web address], password [email or web address] to get updated schedule on who will round on this patient, as hospitalists switch teams weekly. If 7PM-7AM, please contact night-coverage at [email or web address], password [email or web address] for any overnight needs.  10/07/2014, 11:43 AM

## 2014-10-08 LAB — COMPREHENSIVE METABOLIC PANEL
ALK PHOS: 573 U/L — AB (ref 39–117)
ALT: 39 U/L — ABNORMAL HIGH (ref 0–35)
ANION GAP: 5 (ref 5–15)
AST: 79 U/L — ABNORMAL HIGH (ref 0–37)
Albumin: 1.3 g/dL — ABNORMAL LOW (ref 3.5–5.2)
BILIRUBIN TOTAL: 0.5 mg/dL (ref 0.3–1.2)
BUN: 7 mg/dL (ref 6–23)
CO2: 26 mmol/L (ref 19–32)
Calcium: 7.8 mg/dL — ABNORMAL LOW (ref 8.4–10.5)
Chloride: 101 mEq/L (ref 96–112)
Creatinine, Ser: 0.42 mg/dL — ABNORMAL LOW (ref 0.50–1.10)
GFR calc Af Amer: >90 mL/min (ref >90)
GFR calc non Af Amer: >90 mL/min (ref >90)
GLUCOSE: 135 mg/dL — AB (ref 70–99)
Potassium: 3.7 mmol/L (ref 3.5–5.1)
SODIUM: 132 mmol/L — AB (ref 135–145)
Total Protein: 7.4 g/dL (ref 6.0–8.3)

## 2014-10-08 LAB — GLUCOSE, CAPILLARY
GLUCOSE-CAPILLARY: 147 mg/dL — AB (ref 70–99)
GLUCOSE-CAPILLARY: 83 mg/dL (ref 70–99)
Glucose-Capillary: 105 mg/dL — ABNORMAL HIGH (ref 70–99)

## 2014-10-08 LAB — DIFFERENTIAL
BASOS ABS: 0 10*3/uL (ref 0.0–0.1)
Basophils Relative: 0 % (ref 0–1)
Eosinophils Absolute: 0.1 10*3/uL (ref 0.0–0.7)
Eosinophils Relative: 1 % (ref 0–5)
LYMPHS PCT: 21 % (ref 12–46)
Lymphs Abs: 1.1 10*3/uL (ref 0.7–4.0)
MONO ABS: 0.8 10*3/uL (ref 0.1–1.0)
Monocytes Relative: 15 % — ABNORMAL HIGH (ref 3–12)
NEUTROS PCT: 63 % (ref 43–77)
Neutro Abs: 3.4 10*3/uL (ref 1.7–7.7)

## 2014-10-08 LAB — CBC
HEMATOCRIT: 26.3 % — AB (ref 36.0–46.0)
HEMOGLOBIN: 8.6 g/dL — AB (ref 12.0–15.0)
MCH: 29.6 pg (ref 26.0–34.0)
MCHC: 32.7 g/dL (ref 30.0–36.0)
MCV: 90.4 fL (ref 78.0–100.0)
PLATELETS: 177 10*3/uL (ref 150–400)
RBC: 2.91 MIL/uL — ABNORMAL LOW (ref 3.87–5.11)
RDW: 15.7 % — AB (ref 11.5–15.5)
WBC: 5.5 10*3/uL (ref 4.0–10.5)

## 2014-10-08 LAB — MAGNESIUM: Magnesium: 1.5 mg/dL (ref 1.5–2.5)

## 2014-10-08 LAB — TRIGLYCERIDES: TRIGLYCERIDES: 89 mg/dL (ref <150)

## 2014-10-08 LAB — PREALBUMIN: PREALBUMIN: 3.8 mg/dL — AB (ref 17.0–34.0)

## 2014-10-08 LAB — PHOSPHORUS: PHOSPHORUS: 4.2 mg/dL (ref 2.3–4.6)

## 2014-10-08 MED ORDER — TRACE MINERALS CR-CU-F-FE-I-MN-MO-SE-ZN IV SOLN
INTRAVENOUS | Status: DC
  Filled 2014-10-08: qty 720

## 2014-10-08 NOTE — Progress Notes (Signed)
Progress Note   Melinda Conrad PNT:[id number] DOB: 1951-05-06 DOA: 09/14/2014 PCP: Kevan Ny, MD   Brief Narrative:   Melinda Conrad is an 64 y.o. female with history of pancreatitis, hypertension, pancreatic cancer with prior Whipple surgery (08/07/14) with preoperative radiation and chemotherapy. Admitted to ICU12/25/2015 with a hemoglobin of 5.8. And a complaint of several days of melanotic stools, fever up to 104.10F, and hematemesis.   09/14/14 CT abd/pelvis showed two ovoid high-density lesions adjacent to the pancreatic stent within the jejunum could represent hemorrhage or hematoma. No additional gross evidence of surgical complication. Gastroenterology was consulted.the patient was placed on an IV Protonix drip. Initial ?GI bleed was due to chronic NSAID use vs ischemia induced from her hypotension. EGD was deferred because of the patient's persistent hypotension and clincial instability at the time of sentinel bleed.  Blood cultures performed at the time of admission revealed Klebsiella pneumoniae. The patient was initially started on Zosyn, but this was narrowed to ceftriaxone and subsequently d/c altoghter 09/28/14.  CT abd/pelvis for abd pain 09/16/2014 with new portal vein thrombus, bilateral pleural effusions with bibasilar infiltrates .Gen. surgery was consulted. After careful consideration amongst all consultants due to the patient's recent GI bleed, the patient was started on a heparin drip.   The patient continued to have progressive thrombocytopenia. Heme/Onc, Dr. Benay Spice was consulted. The patient's heparin drip was subsequently held with the hopes that with her thrombocytopenia will improved with treatment for sepsis. Thrombocytopenia improving.   Unfortunately hemoglobin dropped 9-->6.7, patient complained of left flank pain not relieved by IV pain medications. Persistently hypotensive over the course of 1/5-1/6 therefore transferred back to step down unit.  Heparin completely discontinued because of new onset anemia She devleoped 2 further epiosdes of N/V of BRB on 09/30/13 and Gen surgery and GI input were sought.  Unclear etilogy of Abd pain- might represent extension of Portal venous thrombosis?  She has reached another plateau wherein she has stopped having any GI bleed as of 10/02/14, however now the ? Is when is it truly safe to feed her and whether she will recover completely from this situation?   A limited discussion was undertaken regarding what options we have moving forward and Surg-Onc and GI are to weigh in if patient re-bleeds again and I have discussed with the patient that we will need to come to a decision about what to do if patient suffers from recurrent bleeding.  Assessment/Plan:   Principal problem: Severe sepsis/Klebsiella pneumonia bacteremia  Patient was admitted with sepsis with Klebsiella bacteremia requiring pressor support. CXR compatible with PNA.  Patient improved/stable clinically.  Status post a full treatment course with Rocephin--->Zosyn with antibiotics discontinued on 09/28/14.  Cultures from 09/19/14 and 09/27/14, negative.   CT scan abdomen pelvis 09/16/14 = bibasilar infiltrates right middle lobe-new from chest x-ray 09/14/14.  Active problems:  Hemorrhagic shock/acute blood loss anemia from Staple line of Whipple on Endoscopy 1/8 + s/p Whipple T3N1   Endoscopy 1/8=Friable anastamosis with bleeding.  Re-bled again 09/30/14.  Patient status post 4 units packed red blood cells, with last transfusion given 09/27/14.  Pantoprazole infusion initiated 09/30/14-stopped 10/03/14. Continue Protonix twice a day.  Octreotide infusion initiated 09/30/14-stopped 10/03/14.  Advanced to soft per surgery on 10/05/14.  Still not eating much, however.  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  Platelet count improving. For now  heparin/Lovenox remain on hold.  Acute respiratory failure  Resolved.  Concern for TRALI  secondary to transfusions versus acute lung injury from bacteremia.   Patient Net +17 L since admission, but weight coming down from a high of 66.9 kg (currently 58 kg/was 50.7 kg on admission).  Respiratory status stable.  Thrombocytopenia secondary to DIC and massive ABLA-Nadir = PLT 21 on 09/17/14  Likely secondary to sepsis with possible DIC and consumption.   Current PLT count stable. Platelet function showing recovery and improving.   Acute kidney injury  Secondary to sepsis and pre-azotemia secondary to hypotension. Improved and currently at baseline.   Creatinine stable with resumption of Lasix.  Severe protein calorie malnutrition  Patient has been seen by dietitian.  TPN started 10/02/14.  Weaning.  Continue Ensure.  ALk phos elevated.  Pancreatic adenocarcinoma s/p Whipple  Status post Whipple's with preoperative XRT and chemotherapy.   Patient has been seen by Dr. Benay Spice of oncology and chemotherapy on hold for now. Outpatient follow-up with oncology.  Hypokalemia/hypomagnesemia  Likely secondary to diuretics. Repleted.    Deconditioning/debility  Patient has been seen by physical therapy recommended home health PT.  Prophylaxis  Protonix for GI prophylaxis. SCDs for DVT prophylaxis.  Code Status: Full. Family Communication: Husband, [name], updated at the bedside. Disposition Plan: Home when stable.   IV Access:    PICC placed 09/29/14   Procedures and diagnostic studies:   Dg Chest Port 1 View 09/14/2014: 1. Right IJ line noted ending about the mid SVC. 2. Mild vascular congestion noted. Increased interstitial markings may reflect minimal interstitial edema.    Ct Abdomen Pelvis Wo Contrast 09/14/2014: 1. Two ovoid high-density lesions adjacent to the pancreatic stent within the jejunum could represent hemorrhage or hematoma. 2. No additional gross  evidence of surgical complication on this non IV and oral contrast exam. 3. Potential small additional clot within the stomach is indeterminate.  Dg Abd Portable 2v 09/16/2014: Negative.     Ct Abdomen Pelvis W Contrast 09/16/2014: Changes consistent with prior Whipple procedure with pancreatic stent in place. Some postoperative free fluid is noted although no organized collection to suggest abscess is noted at this time.  New main portal vein thrombus which is nonocclusive but occupies a majority of the lumen of the main portal vein. There is some differential enhancement of the liver identified which in part is likely related to this thrombus and timing of the contrast bolus.  New bilateral pleural effusions with bibasilar infiltrates particularly in the right middle lobe. This may be the etiology of the patient's elevated white blood cell count. These are new from the prior exam from 09/14/2014.  Changes of anasarca.    Dg Chest Port 1 View 09/17/2014: 1. Interval development of widespread interstitial and airspace disease throughout the lungs bilaterally concerning for severe multilobar pneumonia.    Dg Chest Port 1 View 09/19/2014: 1. Right IJ line in stable position. 2. Severe dense diffuse bilateral pulmonary infiltrates, no change from prior exam .    Dg Chest Port 1 View 09/21/2014: Severe diffuse bilateral airspace disease, unchanged.     Dg Chest Port 1 View 09/22/2014: Slightly improved aeration bilaterally. Persistent interstitial pattern of infiltrates bilaterally.     Ct Abdomen Pelvis W Contrast 09/26/2014: 1. Again noted nonobstructive small amount of thrombus in main portal vein without change from prior exam. 2. Stable postsurgical changes post Whipple procedure. Again noted pancreatic duct stent in place. Stable heterogeneous pattern of enhancement of the liver 3. Again noted small amount of perihepatic and perisplenic ascites. Small amount of free fluid is  noted in right paracolic  gutter. Anasarca infiltration subcutaneous fat abdominal and pelvic wall again noted. 4. No small bowel or colonic obstruction. Moderate stool noted in right colon and sigmoid colon. No pericecal inflammation. Normal appendix. 5. Mild distended urinary bladder. 6. No hydronephrosis or hydroureter. Bilateral renal symmetrical excretion.    Dg Chest 2 View 09/28/2014: Slight improvement in mixed interstitial and airspace disease, possibly due to edema, with small bilateral effusions.    EGD 09/28/14: 1. Prior Whipple surgery with gastroenterostomy in the gastric body 2. Fiable areas at the anastomosis 3. The EGD otherwise appeared normal  Dg Chest Port 1 View 09/29/2014: 1. Right PICC line in good position. 2. Interstitial edema pattern.    2-D echocardiogram 1/54/00: Normal systolic function with an EF 55-60 percent. No regional wall motion abnormalities.  Dg Chest 1 View 10/02/2014: 1. Findings remain compatible with mildly increased interstitial edema. 2. Bibasilar consolidation, left greater than right, question developing pneumonia versus atelectasis.     Medical Consultants:    Dr. Erskine Emery, Gastroenterology   Dr. Stark Klein, Surgery  Dr. Lane Hacker, Palliative Care  Dr. Baltazar Apo, Critical Care  Anti-Infectives:    Ceftriaxone 12/25 >> 12/26  Zosyn 12/26 >>12/28  Rocephin 12/28>>> 09/26/14  Zosyn 1/6---09/28/14  Subjective:   [name] is tolerating her diet but still only eating a few bites at a time.  Bowels moving, brown stool, no black or bloody stools.  Still reports moderate abdominal pain.  No N/V.  Objective:    Filed Vitals:   10/07/14 1426 10/07/14 2201 10/08/14 0010 10/08/14 0610  BP: 147/70 170/88 161/88 164/82  Pulse: 67 73  71  Temp: 99.1 F (37.3 C) 98.3 F (36.8 C)  98.7 F (37.1 C)  TempSrc: Oral Oral  Oral  Resp: '16 18  18  ' Height:      Weight:    58 kg (127 lb 13.9 oz)  SpO2: 100% 100%  100%    Intake/Output Summary (Last  24 hours) at 10/08/14 0927 Last data filed at 10/08/14 0756  Gross per 24 hour  Intake    540 ml  Output      0 ml  Net    540 ml    Exam: Gen:  NAD Cardiovascular:  RRR, No M/R/G, hyperdynamic precordium Respiratory:  Lungs CTAB Gastrointestinal:  Abdomen soft, NT/ND, + BS Extremities:  Trace edema   Data Reviewed:    Labs: Basic Metabolic Panel:  Recent Labs Lab 10/03/14 0455 10/04/14 0500 10/05/14 0510 10/07/14 0444 10/08/14 0332  NA 135 132* 135 135 132*  K 3.7 3.6 3.6 4.0 3.7  CL 114* 105 104 103 101  CO2 '19 22 28 29 26  ' GLUCOSE 140* 147* 109* 92 135*  BUN '7 6 7 8 7  ' CREATININE 0.57 0.47* 0.38* 0.39* 0.42*  CALCIUM 7.2* 7.2* 7.2* 7.7* 7.8*  MG 1.6 1.3* 1.5 1.6 1.5  PHOS 2.9 3.4 4.1 5.1* 4.2   GFR Estimated Creatinine Clearance: 61.1 mL/min (by C-G formula based on Cr of 0.42). Liver Function Tests:  Recent Labs Lab 10/02/14 1015 10/03/14 0455 10/04/14 0500 10/08/14 0332  AST '20 20 18 ' 79*  ALT '15 14 11 ' 39*  ALKPHOS 488* 470* 424* 573*  BILITOT 0.7 0.5 0.4 0.5  PROT 5.4* 5.9* 6.0 7.4  ALBUMIN 1.0* 1.1* 1.1* 1.3*   Coagulation profile  Recent Labs Lab 10/02/14 1640 10/03/14 0455  INR 1.51* 1.53*    CBC:  Recent Labs Lab 10/02/14 1640 10/03/14  0455 10/08/14 0332  WBC 5.1 5.5 5.5  NEUTROABS 3.4 3.7 3.4  HGB 9.1* 9.4* 8.6*  HCT 27.8* 29.0* 26.3*  MCV 90.3 90.1 90.4  PLT 97* 99* 177   CBG: CBG (last 3)   Recent Labs  10/07/14 1620 10/07/14 2138 10/08/14 0729  GLUCAP 129* 118* 147*   Microbiology No results found for this or any previous visit (from the past 240 hour(s)).   Medications:   . antiseptic oral rinse  7 mL Mouth Rinse BID  . feeding supplement (ENSURE COMPLETE)  237 mL Oral TID BM  . feeding supplement (ENSURE)  1 Container Oral TID BM  . fentaNYL  25 mcg Transdermal Q72H  . insulin aspart  0-9 Units Subcutaneous TID WC  . lidocaine  1 patch Transdermal Q24H  . pantoprazole (PROTONIX) IV  40 mg Intravenous  Q12H  . potassium chloride  20 mEq Oral BID  . pregabalin  25 mg Oral QHS  . sertraline  100 mg Oral q morning - 10a  . sodium chloride  10-40 mL Intracatheter Q12H  . sucralfate  1 g Oral TID WC & HS   Continuous Infusions: . Marland KitchenTPN (CLINIMIX-E) Adult 30 mL/hr at 10/08/14 0902  . dextrose 5 % and 0.9% NaCl 10 mL/hr at 10/05/14 1253    Time spent: 25 minutes.     LOS: 24 days   [facility] Pager [phone]. If unable to reach me by pager, please call my cell phone at [phone].  *Please refer to [email or web address], password [email or web address] to get updated schedule on who will round on this patient, as hospitalists switch teams weekly. If 7PM-7AM, please contact night-coverage at [email or web address], password [email or web address] for any overnight needs.  10/08/2014, 9:27 AM

## 2014-10-08 NOTE — Progress Notes (Signed)
PARENTERAL NUTRITION CONSULT NOTE   Pharmacy Consult for TPN Indication: Severe protein calorie malnutrion  No Known Allergies  Patient Measurements: Height: '5\' 3"'  (160 cm) Weight: 127 lb 13.9 oz (58 kg) IBW/kg (Calculated) : 52.4 Adjusted Body Weight: 56.5 kg  Vital Signs: Temp: 98.7 F (37.1 C) (01/18 0610) Temp Source: Oral (01/18 0610) BP: 164/82 mmHg (01/18 0610) Pulse Rate: 71 (01/18 0610) Intake/Output from previous day: 01/17 0701 - 01/18 0700 In: 510 [P.O.:510] Out: -  Intake/Output from this shift: Total I/O In: 240 [P.O.:240] Out: -   Labs:  Recent Labs  10/08/14 0332  WBC 5.5  HGB 8.6*  HCT 26.3*  PLT 177     Recent Labs  10/07/14 0444 10/08/14 0332  NA 135 132*  K 4.0 3.7  CL 103 101  CO2 29 26  GLUCOSE 92 135*  BUN 8 7  CREATININE 0.39* 0.42*  CALCIUM 7.7* 7.8*  MG 1.6 1.5  PHOS 5.1* 4.2  PROT  --  7.4  ALBUMIN  --  1.3*  AST  --  79*  ALT  --  39*  ALKPHOS  --  573*  BILITOT  --  0.5  TRIG  --  89  Corr Ca 10.0 Estimated Creatinine Clearance: 61.1 mL/min (by C-G formula based on Cr of 0.42).    Recent Labs  10/07/14 2138 10/08/14 0729 10/08/14 1134  GLUCAP 118* 147* 105*   Insulin Requirements: 2 units Novolog sensitive SSI in past 24 hrs  Current Nutrition:  Soft diet as of 1/15 Ensure complete and ensure pudding TID between meals Clinimix-E 5/20 at 30 ml/hr (reduced to this rate this AM) 20% Lipids at 10 ml/hr MWF only  IVF: D5NS at [facility] access: PICC TPN start date: 10/02/14  ASSESSMENT                                                                                                          HPI:  9 y/oF with PMH of pancreatitis, HTN, pancreatic cancer with prior Whipple surgery (08/07/14) with preoperative radiation and chemotherapy admitted to ICU on 09/14/2014 with Hgb 5.8, several days of melanotic stools, fever, hematemesis, sepsis secondary to bacteremia.  Patient is s/p EGD on 1/8 with friable areas  at anastomosis.  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  Pharmacy consulted today to start TPN for this patient.    Significant events:  1/15: Advanced to soft diet, tolerating some - ate grits this morning. May be able to wean TPN soon. 1/16: Order received to begin weaning TPN if ok with Dr. Barry Dienes (she is not on service until Monday 1/18) 1/18: Charted as eating 80% of breakfast this AM.  Pt states she has been taking small amounts but tolerating what she does eat.   Today:  10/08/2014  Glucose: CBGs at goal <168m/dl   Electrolytes: all WNL except Na slightly low. K+ remains WNL on oral KDur 20 meq BID.  Renal: SCr low, stable   LFTs:  Transaminases and Alk Phos elevated, rising  TGs: WNL  Prealbumin: <3 (1/13) reflects severely depleted protein stores / severe illness  NUTRITIONAL GOALS                                                                 RD recs: Kcal:1600-1800 Protein: 90-100 gram Fluid: >/= 1600 ml daily Clinimix 5/15 at a goal rate of 59m/hr + 20% fat emulsion MWF at 167mhr to provide: 96g/day protein, avg 1569Kcal/day.  PLAN  ______________________________________________  1. At 1800 today:  Clinimix-E 5/20 at 30 mL/hr  No fat emulsion today given improving PO intake and national backorder of product  TPN to contain standard multivitamins and trace elements. 2. Continue sensitive SSI,  AC and HS.  3. TPN lab panels on Mondays & Thursdays. 4. F/u daily. 5. Discontinue TPN when OK with attending MD.  RaClayburn PertPharmD, BCPS Pager: [phone]/18/2016  1:18 PM

## 2014-10-08 NOTE — Progress Notes (Signed)
Noted that yesterday's TPN rate was entered by pharmacy for 60 mL/hr instead of 40 mL/hr as intended.  Patient will not have enough volume in bag to last until 6 PM at the current rate.  Will reduce rate to 30 mL/hr for now and re-evaluate after MD rounds today.  Clayburn Pert, PharmD, BCPS Pager: [phone] 10/08/2014  8:51 AM

## 2014-10-08 NOTE — Progress Notes (Signed)
NUTRITION FOLLOW-UP  INTERVENTION: -TPN per pharmacy -Continue Ensure TID -Discontinue Ensure Pudding po BID -Magic Cups BID -HS snack: Ensure milkshake -RD to continue to monitor  NUTRITION DIAGNOSIS: Inadequate oral intake related to nausea/abd pain/decreased appetite as evidenced by PO intake < 75%, 10 lb wt loss in one month, ongoing.  Goal: Pt to meet >/= 90% of their estimated nutrition needs, not met   Monitor:  Diet order, total protein/energy intake, labs, weights, GI profile, education needs  ASSESSMENT: Admitted to PCCM service via ED with melena X several days, isolated fever on evening prior to admission (to 104.0 and trated with ibuprofen), episode of hematemesis on evening of presentation to ED for which EMS was dispatched  12/28 -Pt familiar to RD from previous admit in 07/2014 -Pt s/p Whipple procedure; was on IMPACT supplementation trial that required  3 supplements/day for 5 days pre op and 5 days post op .  -Pt continued to be followed by outpatient oncology RD post d/c. Was last seen on 08/24/14. RD had encouraged pt to consume Ensure TID, and was provided with complimentary case of supplement. RD also educated pt on increasing intake of high protein/kcal foods -Pt lethargic during initial RD assessment today. Pt reported ongoing decreased appetite, and not able to tolerate significant intake of solid foods. Was drinking Ensure at home, but was unable to quantify amount daily -Endorsed ongoing weight loss of 10 lb in past one month (8.3% body weight loss, severe for time frame) -NPO for GI bleed. Possible EGD pending surgery recommendations. Palliative care c/s pending. -Refeeding risk d/t chronic illness, severe wt loss, and prolonged period of sup-optimal intake ( > one  Month) -Phos/Mg/K low on admit. Mg now WNL w/repletion  09/24/14 -Pt now on regular diet. PO intake: 50-100% -Pt's weight is +8 lb since 12/28 -Pt is receiving Ensure supplements, states she  is drinking them. -Pt states that she plans to continue drinking supplements at home. Pt feels better today. -Will continue current interventions  10/01/14: - Pt NPO due to hematemesis. Recommend soft diet when stable.  - Pt reports good appetite prior to vomiting. Pt currently hungry. She reports that she was drinking Ensure prior to NPO. Recommend continuing supplements once diet advanced.  - Wt has trended up from 111 lbs on 12/28. Current weight is 133 lbs. Pt in positive fluid balance (+16 L since admission).   1/14: - s/p EGD - Current diet is full liquids.  - Pt drank coffee and ate 3-4 bites of grits this morning for breakfast which she tolerated. She drank Ensure yesterday. Pt likes pudding, but did not feel like it this morning.  - Wt continues to trend up. Now 147 lbs (111 lbs on 12/28). Pt remains in positive fluid balance. Lasix has been increased.  - TPN currently running at 60 mL/hr.   1/18 -Pt now on soft diet, PO intake: 80%. Pt's husband states she nibbles at her food. -TPN has been decreased to 30 ml/hr. Per pharmacy, TPN is to be weaned. -Pt states she does not like the Ensure pudding, RD to D/C -Pt is drinking Ensure, attempts to drink 2 a day but is only consuming around 1 per day. -Pt would like to try magic cups with meals. Also, a milkshake made with strawberry Ensure. Per husband, pt is tiring of the supplements. RD to order. -Pt's weight is 127 lb, +3 lb since admit.  Labs: CBGs: 118-147 Na low Low Creatinine K WNL Mg WNL Phos WNL  Height:  Ht Readings from Last 1 Encounters:  10/01/14 '5\' 3"'  (1.6 m)   Weight: Wt Readings from Last 1 Encounters:  10/08/14 127 lb 13.9 oz (58 kg)  10/04/14 147 lb 10/01/14 137 lb Admit  124 lb  BMI:  Body mass index is 22.66 kg/(m^2).  Estimated Nutritional Needs: Kcal:1600-1800 Protein: 90-100 gram Fluid: >/= 1600 ml daily  Skin: surgical incision on abd  Diet Order: DIET SOFT .TPN (CLINIMIX-E)  Adult  EDUCATION NEEDS: -No education needs identified at this time   Intake/Output Summary (Last 24 hours) at 10/08/14 1109 Last data filed at 10/08/14 0756  Gross per 24 hour  Intake    540 ml  Output      0 ml  Net    540 ml    Last BM: 1/14  Labs:   Recent Labs Lab 10/05/14 0510 10/07/14 0444 10/08/14 0332  NA 135 135 132*  K 3.6 4.0 3.7  CL 104 103 101  CO2 '28 29 26  ' BUN '7 8 7  ' CREATININE 0.38* 0.39* 0.42*  CALCIUM 7.2* 7.7* 7.8*  MG 1.5 1.6 1.5  PHOS 4.1 5.1* 4.2  GLUCOSE 109* 92 135*    CBG (last 3)   Recent Labs  10/07/14 1620 10/07/14 2138 10/08/14 0729  GLUCAP 129* 118* 147*    Scheduled Meds: . antiseptic oral rinse  7 mL Mouth Rinse BID  . feeding supplement (ENSURE COMPLETE)  237 mL Oral TID BM  . feeding supplement (ENSURE)  1 Container Oral TID BM  . fentaNYL  25 mcg Transdermal Q72H  . insulin aspart  0-9 Units Subcutaneous TID WC  . lidocaine  1 patch Transdermal Q24H  . pantoprazole (PROTONIX) IV  40 mg Intravenous Q12H  . potassium chloride  20 mEq Oral BID  . pregabalin  25 mg Oral QHS  . sertraline  100 mg Oral q morning - 10a  . sodium chloride  10-40 mL Intracatheter Q12H  . sucralfate  1 g Oral TID WC & HS    Continuous Infusions: . Marland KitchenTPN (CLINIMIX-E) Adult 30 mL/hr at 10/08/14 0902  . dextrose 5 % and 0.9% NaCl 10 mL/hr at 10/05/14 1253   Clayton Bibles, MS, RD, LDN Pager: [phone] After Hours Pager: [phone]

## 2014-10-09 LAB — GLUCOSE, CAPILLARY
GLUCOSE-CAPILLARY: 70 mg/dL (ref 70–99)
GLUCOSE-CAPILLARY: 59 mg/dL — AB (ref 70–99)
GLUCOSE-CAPILLARY: 68 mg/dL — AB (ref 70–99)
Glucose-Capillary: 56 mg/dL — ABNORMAL LOW (ref 70–99)
Glucose-Capillary: 56 mg/dL — ABNORMAL LOW (ref 70–99)
Glucose-Capillary: 81 mg/dL (ref 70–99)

## 2014-10-09 MED ORDER — SUCRALFATE 1 GM/10ML PO SUSP: 1.0000 g | Freq: Three times a day (TID) | ORAL | Status: DC

## 2014-10-09 MED ORDER — POTASSIUM CHLORIDE CRYS ER 20 MEQ PO TBCR: 20.0000 meq | EXTENDED_RELEASE_TABLET | Freq: Every day | ORAL | Status: DC

## 2014-10-09 MED ORDER — PANTOPRAZOLE SODIUM 40 MG PO TBEC: 40.0000 mg | DELAYED_RELEASE_TABLET | Freq: Two times a day (BID) | ORAL | Status: DC

## 2014-10-09 MED ORDER — PREGABALIN 25 MG PO CAPS: 25.0000 mg | ORAL_CAPSULE | Freq: Every day | ORAL | Status: DC

## 2014-10-09 MED ORDER — OXYCODONE-ACETAMINOPHEN 5-325 MG PO TABS: 1.0000 | ORAL_TABLET | ORAL | Status: DC | PRN

## 2014-10-09 MED ORDER — ENSURE COMPLETE PO LIQD: 237.0000 mL | Freq: Three times a day (TID) | ORAL | Status: AC

## 2014-10-09 MED ORDER — CETYLPYRIDINIUM CHLORIDE 0.05 % MT LIQD: 7.0000 mL | Freq: Two times a day (BID) | OROMUCOSAL | Status: DC

## 2014-10-09 MED ORDER — FENTANYL 25 MCG/HR TD PT72: 25.0000 ug | MEDICATED_PATCH | TRANSDERMAL | Status: DC

## 2014-10-09 MED ORDER — ONDANSETRON HCL 4 MG PO TABS: 4.0000 mg | ORAL_TABLET | Freq: Three times a day (TID) | ORAL | Status: DC | PRN

## 2014-10-09 NOTE — Discharge Summary (Signed)
Physician Discharge Summary  Melinda Conrad JJO:[id number] DOB: June 18, 1951 DOA: 09/14/2014  PCP: Kevan Ny, MD  Admit date: 09/14/2014 Discharge date: 10/09/2014  Recommendations for Outpatient Follow-up:  1. Patient will continue taking potassium supplementation once a day for 2 weeks on discharge. Please follow-up with primary care physician to make sure potassium is within normal limits. 2. Anticoagulation on hold due to risk of bleeding.  Discharge Diagnoses:  Principal Problem:   Septic shock Active Problems:   Depression   Protein-calorie malnutrition, severe   Acute GI bleeding   Hemorrhagic shock   Thrombocytopenia   Portal vein thrombosis   Sepsis   Acute respiratory failure with hypoxia   Hypokalemia   Hypomagnesemia   Sepsis associated hypotension   Bacteremia due to Klebsiella pneumoniae   Anemia due to acute blood loss    Discharge Condition: stable   Diet recommendation: as tolerated   History of present illness:  64 y.o. female with history of pancreatitis, hypertension, pancreatic cancer with prior Whipple surgery (08/07/14) with preoperative radiation and chemotherapy. Admitted to ICU12/25/2015 with a hemoglobin of 5.8, complaints of melanotic stools, fevers of up to 104F and hematemesis.  09/14/14 CT abd/pelvis showed two ovoid high-density lesions adjacent to the pancreatic stent within the jejunum could represent hemorrhage or hematoma.  Gastroenterology was consulted.the patient was placed on an IV Protonix drip. Initial ?GI bleed was due to chronic NSAID use vs ischemia induced from her hypotension. EGD was deferred because of the patient's persistent hypotension and clincial instability at the time of sentinel bleed.  Blood cultures performed at the time of admission revealed Klebsiella pneumoniae. The patient was initially started on Zosyn, but this was narrowed to ceftriaxone and subsequently d/c altoghter 09/28/14.  CT abd/pelvis for abd pain  09/16/2014 with new portal vein thrombus, bilateral pleural effusions with bibasilar infiltrates .Gen. surgery was consulted. After careful consideration amongst all consultants due to the patient's recent GI bleed, the patient was started on a heparin drip.   The patient continued to have progressive thrombocytopenia. Heme/Onc, Dr. Benay Spice was consulted. The patient's heparin drip was subsequently held.    Unfortunately hemoglobin dropped 9-->6.7, patient complained of left flank pain not relieved by IV pain medications. Persistently hypotensive over the course of 1/5-1/6 therefore transferred back to step down unit. Heparin completely discontinued because of new onset anemia. She devleoped 2 further epiosdes of N/V of BRB on 09/30/13 and Gen surgery and GI input were sought.  Unclear etilogy of Abd pain, possible extension of Portal venous thrombosis.   Assessment/Plan:   Principal problem: Severe sepsis/Klebsiella pneumonia bacteremia  Patient was admitted with sepsis with Klebsiella bacteremia requiring pressor support. CXR compatible with PNA. Patient improved/stable clinically.CT scan abdomen pelvis 09/16/14 = bibasilar infiltrates right middle lobe-new from chest x-ray 09/14/14.  She completed full treatment course with Rocephin--->Zosyn with antibiotics discontinued on 09/28/14.  Cultures from 09/19/14 and 09/27/14, negative.    Active problems:  Hemorrhagic shock/acute blood loss anemia from Staple line of Whipple on Endoscopy 1/8 + s/p Whipple T3N1   Endoscopy 1/8=Friable anastamosis with bleeding. Re-bled again 09/30/14.  Patient status post 4 units PRBC with last transfusion given 09/27/14.  Pantoprazole infusion initiated 09/30/14-stopped 10/03/14. Continue PPI therapy   Octreotide infusion initiated 09/30/14-stopped 10/03/14.  Advanced to soft diet per surgery recommendations.  Portal vein thrombosis-unclear if propagating  Anticoagulation held secondary to  significant thrombocytopenia/bleeding.  Platelet count improved. Acute respiratory failure  Resolved. Concern for TRALI secondary to transfusions versus acute lung injury from  bacteremia.   Respiratory status remains stable. Thrombocytopenia secondary to DIC and massive ABLA-Nadir = PLT 21 on 09/17/14  Likely secondary to sepsis with possible DIC and consumption.   Current PLT count stable.  Acute kidney injury  Secondary to sepsis and pre-azotemia secondary to hypotension. Improved and at baseline.  Severe protein calorie malnutrition  Patient has been seen by dietitian.  Weaned TPN. Continue Ensure. Pancreatic adenocarcinoma s/p Whipple  Status post Whipple's with preoperative XRT and chemotherapy.   Patient has been seen by Dr. Benay Spice of oncology and chemotherapy on hold for now. Outpatient follow-up with oncology. Hypokalemia/hypomagnesemia  Likely secondary to diuretics. Repleted. Deconditioning/debility  Patient has been seen by physical therapy recommended home health PT. Prophylaxis  Protonix for GI prophylaxis. SCDs for DVT prophylaxis in hospital.   Code Status: Full. Family Communication: family not at the bedside this am    IV Access:    PICC placed 09/29/14   Procedures and diagnostic studies:   Dg Chest Port 1 View 09/14/2014: 1. Right IJ line noted ending about the mid SVC. 2. Mild vascular congestion noted. Increased interstitial markings may reflect minimal interstitial edema.   Ct Abdomen Pelvis Wo Contrast 09/14/2014: 1. Two ovoid high-density lesions adjacent to the pancreatic stent within the jejunum could represent hemorrhage or hematoma. 2. No additional gross evidence of surgical complication on this non IV and oral contrast exam. 3. Potential small additional clot within the stomach is indeterminate.  Dg Abd Portable 2v 09/16/2014: Negative.   Ct Abdomen Pelvis W Contrast 09/16/2014: Changes consistent with prior Whipple  procedure with pancreatic stent in place. Some postoperative free fluid is noted although no organized collection to suggest abscess is noted at this time. New main portal vein thrombus which is nonocclusive but occupies a majority of the lumen of the main portal vein. There is some differential enhancement of the liver identified which in part is likely related to this thrombus and timing of the contrast bolus. New bilateral pleural effusions with bibasilar infiltrates particularly in the right middle lobe. This may be the etiology of the patient's elevated white blood cell count. These are new from the prior exam from 09/14/2014. Changes of anasarca.   Dg Chest Port 1 View 09/17/2014: 1. Interval development of widespread interstitial and airspace disease throughout the lungs bilaterally concerning for severe multilobar pneumonia.   Dg Chest Port 1 View 09/19/2014: 1. Right IJ line in stable position. 2. Severe dense diffuse bilateral pulmonary infiltrates, no change from prior exam .   Dg Chest Port 1 View 09/21/2014: Severe diffuse bilateral airspace disease, unchanged.   Dg Chest Port 1 View 09/22/2014: Slightly improved aeration bilaterally. Persistent interstitial pattern of infiltrates bilaterally.   Ct Abdomen Pelvis W Contrast 09/26/2014: 1. Again noted nonobstructive small amount of thrombus in main portal vein without change from prior exam. 2. Stable postsurgical changes post Whipple procedure. Again noted pancreatic duct stent in place. Stable heterogeneous pattern of enhancement of the liver 3. Again noted small amount of perihepatic and perisplenic ascites. Small amount of free fluid is noted in right paracolic gutter. Anasarca infiltration subcutaneous fat abdominal and pelvic wall again noted. 4. No small bowel or colonic obstruction. Moderate stool noted in right colon and sigmoid colon. No pericecal inflammation. Normal appendix. 5. Mild distended urinary bladder. 6. No  hydronephrosis or hydroureter. Bilateral renal symmetrical excretion.   Dg Chest 2 View 09/28/2014: Slight improvement in mixed interstitial and airspace disease, possibly due to edema, with small bilateral effusions.  EGD 09/28/14: 1. Prior Whipple surgery with gastroenterostomy in the gastric body 2. Fiable areas at the anastomosis 3. The EGD otherwise appeared normal  Dg Chest Port 1 View 09/29/2014: 1. Right PICC line in good position. 2. Interstitial edema pattern.   2-D echocardiogram 3/81/82: Normal systolic function with an EF 55-60 percent. No regional wall motion abnormalities.  Dg Chest 1 View 10/02/2014: 1. Findings remain compatible with mildly increased interstitial edema. 2. Bibasilar consolidation, left greater than right, question developing pneumonia versus atelectasis.    Medical Consultants:    Dr. Erskine Emery, Gastroenterology   Dr. Stark Klein, Surgery  Dr. Lane Hacker, Palliative Care  Dr. Baltazar Apo, Critical Care  Anti-Infectives:    Ceftriaxone 12/25 >> 12/26  Zosyn 12/26 >>12/28  Rocephin 12/28>>> 09/26/14  Zosyn 1/6---09/28/14   Signed:  Leisa Lenz, MD  [facility] 10/09/2014, 10:59 AM  Pager #: [phone]    Discharge Exam: Filed Vitals:   10/09/14 0626  BP: 121/68  Pulse: 74  Temp: 98.8 F (37.1 C)  Resp: 18   Filed Vitals:   10/08/14 0610 10/08/14 1419 10/08/14 2159 10/09/14 0626  BP: 164/82 128/69 122/66 121/68  Pulse: 71 64 64 74  Temp: 98.7 F (37.1 C) 98.3 F (36.8 C) 98.9 F (37.2 C) 98.8 F (37.1 C)  TempSrc: Oral Oral Oral Oral  Resp: 18 20 16 18   Height:      Weight: 58 kg (127 lb 13.9 oz)   57.7 kg (127 lb 3.3 oz)  SpO2: 100% 100% 99% 99%    General: Pt is alert, follows commands appropriately, not in acute distress Cardiovascular: Regular rate and rhythm, S1/S2 +, no murmurs Respiratory: Clear to auscultation bilaterally, no wheezing, no crackles, no rhonchi Abdominal: Soft,  non tender, non distended, bowel sounds +, no guarding Neuro: Grossly nonfocal  Discharge Instructions  Discharge Instructions    Call MD for:  difficulty breathing, headache or visual disturbances    Complete by:  As directed      Call MD for:  persistant nausea and vomiting    Complete by:  As directed      Call MD for:  severe uncontrolled pain    Complete by:  As directed      Diet - low sodium heart healthy    Complete by:  As directed      Discharge instructions    Complete by:  As directed   1. Patient will continue taking potassium supplementation once a day for 2 weeks on discharge. Please follow-up with primary care physician to make sure potassium is within normal limits.     Increase activity slowly    Complete by:  As directed             Medication List    STOP taking these medications        alum & mag hydroxide-simeth 200-200-20 MG/5ML suspension  Commonly known as:  MAALOX/MYLANTA     amLODipine 5 MG tablet  Commonly known as:  NORVASC     bisacodyl 10 MG suppository  Commonly known as:  DULCOLAX     ibuprofen 800 MG tablet  Commonly known as:  ADVIL,MOTRIN     metoCLOPramide 10 MG tablet  Commonly known as:  REGLAN     omeprazole 20 MG capsule  Commonly known as:  PRILOSEC     oxyCODONE 5 MG immediate release tablet  Commonly known as:  ROXICODONE     psyllium 0.52 G capsule  Commonly known as:  REGULOID     sorbitol 70 % solution      TAKE these medications        antiseptic oral rinse 0.05 % Liqd solution  Commonly known as:  CPC / CETYLPYRIDINIUM CHLORIDE 0.05%  7 mLs by Mouth Rinse route 2 (two) times daily.     CALCIUM-VITAMIN D PO  Take 1 capsule by mouth daily.     feeding supplement (ENSURE COMPLETE) Liqd  Take 237 mLs by mouth 3 (three) times daily between meals.     fentaNYL 25 MCG/HR patch  Commonly known as:  DURAGESIC - dosed mcg/hr  Place 1 patch (25 mcg total) onto the skin every 3 (three) days.     ondansetron 4  MG tablet  Commonly known as:  ZOFRAN  Take 1 tablet (4 mg total) by mouth every 8 (eight) hours as needed for nausea or vomiting.     oxyCODONE-acetaminophen 5-325 MG per tablet  Commonly known as:  PERCOCET/ROXICET  Take 1-2 tablets by mouth every 4 (four) hours as needed for moderate pain.     pantoprazole 40 MG tablet  Commonly known as:  PROTONIX  Take 1 tablet (40 mg total) by mouth 2 (two) times daily.     potassium chloride SA 20 MEQ tablet  Commonly known as:  K-DUR,KLOR-CON  Take 1 tablet (20 mEq total) by mouth daily.     pregabalin 25 MG capsule  Commonly known as:  LYRICA  Take 1 capsule (25 mg total) by mouth at bedtime.     sertraline 100 MG tablet  Commonly known as:  ZOLOFT  Take 100 mg by mouth every morning.     sucralfate 1 GM/10ML suspension  Commonly known as:  CARAFATE  Take 10 mLs (1 g total) by mouth 4 (four) times daily -  with meals and at bedtime.           Follow-up Information    Follow up with Marlou Sa, ERIC, MD. Schedule an appointment as soon as possible for a visit in 1 week.   Specialty:  Internal Medicine   Why:  Follow up appt after recent hospitalization   Contact information:   [facility] [place] [address] [phone]        The results of significant diagnostics from this hospitalization (including imaging, microbiology, ancillary and laboratory) are listed below for reference.    Significant Diagnostic Studies: Ct Abdomen Pelvis Wo Contrast  09/14/2014   CLINICAL DATA:  Recent Whipple procedure for pancreatic carcinoma in November 2017. New onset hematemesis.  EXAM: CT ABDOMEN AND PELVIS WITHOUT CONTRAST  TECHNIQUE: Multidetector CT imaging of the abdomen and pelvis was performed following the standard protocol without IV contrast.  COMPARISON:  CTA 03/04/2014  FINDINGS: Examination abdomen is limited by lack of IV oral contrast. Recommend difficult n postsurgical patient.  Lower  chest: There is focus of bandlike atelectasis at right lung base. No pleural fluid. No pericardial fluid.  Hepatobiliary: No focal hepatic lesion on theis non contrast exam. Liver poorly evaluated. No gross evidence duct dilatation. Gallbladder surgically absent.  Pancreas: There is a pancreatic stent extending from the remaining pancreas into the jejunum through the pancreatic jejunostomy. There is a ovoid focus of high density material measuring 2.5 by 1.3 cm adjacent to the distal aspect of the stent within the small bowel (image 28, series 2). Second high-density collection measuring 3.0 x 1.8 cm on image 32, series 2 several cm distal to the first collection.  These could represent focus as of hematoma or hemorrhage. There is no gross evidence of obstruction. There is no intraperitoneal free air.  Spleen: Normal.  Adrenals/urinary tract: Adrenal glands kidneys are normal. Ureters and bladder appear normal.  Stomach/Bowel: Stomach demonstrates postsurgical change consistent with Whipple procedure. There is some high-density material in the gastric fundus measuring 2.2 by 2.0 cm (image 23 from series 2). This could also represent small focus clot. The distal small bowel, appendix, and colon are unremarkable. Small mild fluid along the pericolic gutters.  Vascular/Lymphatic: Abdominal or is normal caliber. No retroperitoneal periportal lymphadenopathy.  Reproductive: Uterus and ovaries are normal.  Musculoskeletal: No aggressive osseous lesion. Degenerate change the spine.  Other: [place] intracranial free air.  Small amount fluid.  IMPRESSION: 1. Two ovoid high-density lesions adjacent to the pancreatic stent within the jejunum could represent hemorrhage or hematoma. 2. No additional gross evidence of surgical complication on this non IV and oral contrast exam. 3. Potential small additional clot within the stomach is indeterminate. Findings conveyed toROBERT LOCKWOOD on 09/14/2014  at19:26.   Electronically Signed    By: Suzy Bouchard M.D.   On: 09/14/2014 19:27   Dg Chest 1 View  10/02/2014   CLINICAL DATA:  64 year old follow-up pleural effusion  EXAM: CHEST - 1 VIEW  COMPARISON:  09/29/2014  FINDINGS: A right-sided PICC line catheter tip projects over the region of the cavoatrial junction.  The cardiac silhouette and mediastinal contours are unchanged.  There has been interval development of increased patchy areas of consolidation, predominantly involving the left lung base. There is no large pleural effusion. There is no pneumothorax. Interstitial prominence is present.  The osseous structures are unchanged.  IMPRESSION: 1. Findings remain compatible with mildly increased interstitial edema. 2. Bibasilar consolidation, left greater than right, question developing pneumonia versus atelectasis.   Electronically Signed   By: Rosemarie Ax   On: 10/02/2014 11:34   Dg Chest 2 View  09/28/2014   CLINICAL DATA:  Follow-up pleural effusion.  Pancreatic cancer.  EXAM: CHEST  2 VIEW  COMPARISON:  09/22/2014.  FINDINGS: Trachea is midline. Right IJ Port-A-Cath tip is in the SVC. Trachea is midline. Heart size normal. Slight improvement in mixed interstitial and airspace disease bilaterally, without complete resolution. Small bilateral effusions.  IMPRESSION: Slight improvement in mixed interstitial and airspace disease, possibly due to edema, with small bilateral effusions.   Electronically Signed   By: Lorin Picket M.D.   On: 09/28/2014 16:04   Ct Abdomen Pelvis W Contrast  09/26/2014   CLINICAL DATA:  Lower abdominal pain, bilateral flank pain, nausea, status post Whipple procedure 07/2014, recurent GI bleeding, anemia, suspected GI bleed  EXAM: CT ABDOMEN AND PELVIS WITH CONTRAST  TECHNIQUE: Multidetector CT imaging of the abdomen and pelvis was performed using the standard protocol following bolus administration of intravenous contrast.  CONTRAST:  31mL OMNIPAQUE IOHEXOL 300 MG/ML  SOLN  COMPARISON:  09/16/2014   FINDINGS: Sagittal images of the spine shows disc space flattening with vacuum disc phenomenon anterior spurring at L5-S1 level. Again noted disc bulge at L4-L5 and L5-S1 level.  Lung bases are unremarkable. Trace bilateral posterior pleural effusion with basilar atelectasis.  Again noted status post Whipple procedure. A pancreatic stent in place again noted. Again noted heterogeneous geographic pattern of enhancement of the liver without significant change from prior exam. Stable nonobstructive thrombosis in main portal vein see axial image 24.  Abdominal aorta is unremarkable. Bilateral renal artery appears patent. Again noted small amount of  perihepatic and perisplenic ascites. No definite mesenteric abscess is noted. No aortic aneurysm. Atherosclerotic calcifications of distal abdominal aorta.  Contrast material noted within small bowel. There is no small bowel obstruction. Moderate stool noted in right colon and sigmoid colon. Normal appendix partially visualized in axial image 52. The uterus and adnexa are unremarkable. Mild distended urinary bladder. Small amount of pelvic free fluid noted posterior cul-de-sac. Again noted anasarca infiltration of subcutaneous fat abdominal and pelvic wall.  The spleen and adrenal glands are unremarkable. Kidneys are symmetrical in size and enhancement. No hydronephrosis or hydroureter. Delayed renal images shows bilateral renal symmetrical excretion. Small amount of free fluid noted in right paracolic gutter. No small bowel or colonic obstruction.  IMPRESSION: 1. Again noted nonobstructive small amount of thrombus in main portal vein without change from prior exam. 2. Stable postsurgical changes post Whipple procedure. Again noted pancreatic duct stent in place. Stable heterogeneous pattern of enhancement of the liver 3. Again noted small amount of perihepatic and perisplenic ascites. Small amount of free fluid is noted in right paracolic gutter. Anasarca infiltration  subcutaneous fat abdominal and pelvic wall again noted. 4. No small bowel or colonic obstruction. Moderate stool noted in right colon and sigmoid colon. No pericecal inflammation. Normal appendix. 5. Mild distended urinary bladder. 6. No hydronephrosis or hydroureter. Bilateral renal symmetrical excretion.   Electronically Signed   By: Lahoma Crocker M.D.   On: 09/26/2014 17:04   Ct Abdomen Pelvis W Contrast  09/16/2014   CLINICAL DATA:  Melena and fever, history of pancreatic carcinoma as following Whipple procedure, elevated white blood cell count  EXAM: CT ABDOMEN AND PELVIS WITH CONTRAST  TECHNIQUE: Multidetector CT imaging of the abdomen and pelvis was performed using the standard protocol following bolus administration of intravenous contrast.  CONTRAST:  70mL OMNIPAQUE IOHEXOL 300 MG/ML SOLN, 168mL OMNIPAQUE IOHEXOL 300 MG/ML SOLN  COMPARISON:  09/14/2014  FINDINGS: New moderate bilateral pleural effusions are identified with lower lobe infiltrate worse in the right base then the left particularly in the right middle lobe. These changes are new from the prior exam and may account for the patient's elevated white blood cell count.  The liver demonstrates diffuse irregular enhancement likely related this the timing of the contrast bolus as the delayed images show a relatively normal enhancement pattern. There is however evidence of thrombus within the main portal vein just before its bifurcation into the right and left portal veins. It is not occlusive but occupies the majority of the lumen of the portal vein.  The spleen, adrenal glands and kidneys are normal in their CT appearance. No obstructive changes are noted.  Postoperative changes in the pancreas are seen with a pancreatic stent in place. Small amount of free fluid is noted within the abdomen which is new from the prior exam and likely related to the prior surgery. No definitive air-fluid collection to suggest abscess is identified.  The bladder is  decompressed by Foley catheter. Diffuse anasarca is noted. The appendix is not well visualized although no inflammatory changes are seen. No pelvic mass lesion is seen. The osseous structures are within normal limits for the patient's given age.  IMPRESSION: Changes consistent with prior Whipple procedure with pancreatic stent in place. Some postoperative free fluid is noted although no organized collection to suggest abscess is noted at this time.  New main portal vein thrombus which is nonocclusive but occupies a majority of the lumen of the main portal vein. There is some differential enhancement of  the liver identified which in part is likely related to this thrombus and timing of the contrast bolus.  New bilateral pleural effusions with bibasilar infiltrates particularly in the right middle lobe. This may be the etiology of the patient's elevated white blood cell count. These are new from the prior exam from 09/14/2014.  Changes of anasarca.  These results were called by telephone at the time of interpretation on 09/16/2014 at 3:48 pm to Amy, the pts nurse, who verbally acknowledged these results.   Electronically Signed   By: Inez Catalina M.D.   On: 09/16/2014 15:50   Dg Chest Port 1 View  09/29/2014   CLINICAL DATA:  PICC line placement  EXAM: PORTABLE CHEST - 1 VIEW  COMPARISON:  Radiograph 09/28/2014  FINDINGS: Interval placement of a right PICC line with tip in the distal SVC. Right IJ catheter remains with tip in the distal SVC. Normal cardiac silhouette. There is fine interstitial pattern consistent with pulmonary interstitial edema. No pneumothorax. No focal consolidation.  IMPRESSION: 1. Right PICC line in good position. 2. Interstitial edema pattern.   Electronically Signed   By: Suzy Bouchard M.D.   On: 09/29/2014 17:27   Dg Chest Port 1 View  09/22/2014   CLINICAL DATA:  Sepsis and status post Whipple procedure to resect pancreatic head carcinoma.  EXAM: PORTABLE CHEST - 1 VIEW  COMPARISON:   09/21/2014  FINDINGS: Right-sided jugular central line tip is stable and positioned within the SVC. Lungs show marginally improved aeration bilaterally with evidence of persistent predominantly interstitial pattern of infiltrates bilaterally. No overt airspace edema or pleural fluid identified. No pneumothorax. The heart size and mediastinal contours remain normal.  IMPRESSION: Slightly improved aeration bilaterally. Persistent interstitial pattern of infiltrates bilaterally.   Electronically Signed   By: Aletta Edouard M.D.   On: 09/22/2014 10:01   Dg Chest Port 1 View  09/21/2014   CLINICAL DATA:  Infiltrates, shortness of breath.  EXAM: PORTABLE CHEST - 1 VIEW  COMPARISON:  09/19/2014  FINDINGS: Right central line remains in place, unchanged. Severe diffuse bilateral pulmonary airspace opacities are again noted, unchanged. Heart is normal size. No effusions or acute bony abnormality.  IMPRESSION: Severe diffuse bilateral airspace disease, unchanged.   Electronically Signed   By: Rolm Baptise M.D.   On: 09/21/2014 07:37   Dg Chest Port 1 View  09/19/2014   CLINICAL DATA:  Respiratory failure.  EXAM: PORTABLE CHEST - 1 VIEW  COMPARISON:  09/17/2014.  FINDINGS: Right IJ line in stable position. Mediastinum normal. Heart size stable. Dense diffuse pulmonary infiltrates are present without significant interim clearing. No pleural effusion or pneumothorax.  IMPRESSION: 1. Right IJ line in stable position. 2. Severe dense diffuse bilateral pulmonary infiltrates, no change from prior exam .   Electronically Signed   By: Crocker   On: 09/19/2014 10:00   Dg Chest Port 1 View  09/17/2014   CLINICAL DATA:  63 year old female with history of healthcare associated pneumonia presenting with shortness of breath and chest pain.  EXAM: PORTABLE CHEST - 1 VIEW  COMPARISON:  Chest x-ray 09/14/2014.  FINDINGS: There is a right-sided internal jugular central venous catheter with tip terminating in the distal  superior vena cava. Marked worsening of aeration, with widespread interstitial and airspace disease throughout the lungs bilaterally, most confluent in the right mid to lower lung, concerning for severe multilobar pneumonia. No definite pleural effusions. Pulmonary vasculature is obscured. Heart size is upper limits of normal. Atherosclerosis in the thoracic  aorta.  IMPRESSION: 1. Interval development of widespread interstitial and airspace disease throughout the lungs bilaterally concerning for severe multilobar pneumonia.   Electronically Signed   By: Vinnie Langton M.D.   On: 09/17/2014 10:52   Dg Chest Port 1 View  09/14/2014   CLINICAL DATA:  Central line placement.  Initial encounter.  EXAM: PORTABLE CHEST - 1 VIEW  COMPARISON:  Chest radiograph performed 08/10/2014  FINDINGS: The patient's right IJ line is noted ending about the mid SVC.  The lungs are well-aerated. Mild vascular congestion is noted. Increased interstitial markings may reflect minimal interstitial edema. There is no evidence of pleural effusion or pneumothorax.  The cardiomediastinal silhouette is borderline normal in size. No acute osseous abnormalities are seen.  IMPRESSION: 1. Right IJ line noted ending about the mid SVC. 2. Mild vascular congestion noted. Increased interstitial markings may reflect minimal interstitial edema.   Electronically Signed   By: Garald Balding M.D.   On: 09/14/2014 22:58   Dg Abd Portable 2v  09/16/2014   CLINICAL DATA:  Abdominal pain.  History of pancreatic cancer.  EXAM: PORTABLE ABDOMEN - 2 VIEW  COMPARISON:  CT abdomen 09/14/2014.  FINDINGS: Pancreatic stent remains centered over the mid abdomen. There is no bowel obstruction.  IMPRESSION: Negative.   Electronically Signed   By: Rolla Flatten M.D.   On: 09/16/2014 12:15    Microbiology: No results found for this or any previous visit (from the past 240 hour(s)).   Labs: Basic Metabolic Panel:  Recent Labs Lab 10/03/14 0455 10/04/14 0500  10/05/14 0510 10/07/14 0444 10/08/14 0332  NA 135 132* 135 135 132*  K 3.7 3.6 3.6 4.0 3.7  CL 114* 105 104 103 101  CO2 19 22 28 29 26   GLUCOSE 140* 147* 109* 92 135*  BUN 7 6 7 8 7   CREATININE 0.57 0.47* 0.38* 0.39* 0.42*  CALCIUM 7.2* 7.2* 7.2* 7.7* 7.8*  MG 1.6 1.3* 1.5 1.6 1.5  PHOS 2.9 3.4 4.1 5.1* 4.2   Liver Function Tests:  Recent Labs Lab 10/03/14 0455 10/04/14 0500 10/08/14 0332  AST 20 18 79*  ALT 14 11 39*  ALKPHOS 470* 424* 573*  BILITOT 0.5 0.4 0.5  PROT 5.9* 6.0 7.4  ALBUMIN 1.1* 1.1* 1.3*   No results for input(s): LIPASE, AMYLASE in the last 168 hours. No results for input(s): AMMONIA in the last 168 hours. CBC:  Recent Labs Lab 10/02/14 1640 10/03/14 0455 10/08/14 0332  WBC 5.1 5.5 5.5  NEUTROABS 3.4 3.7 3.4  HGB 9.1* 9.4* 8.6*  HCT 27.8* 29.0* 26.3*  MCV 90.3 90.1 90.4  PLT 97* 99* 177   Cardiac Enzymes: No results for input(s): CKTOTAL, CKMB, CKMBINDEX, TROPONINI in the last 168 hours. BNP: BNP (last 3 results) No results for input(s): PROBNP in the last 8760 hours. CBG:  Recent Labs Lab 10/07/14 2138 10/08/14 0729 10/08/14 1134 10/08/14 1624 10/09/14 0730  GLUCAP 118* 147* 105* 83 56*    Time coordinating discharge: Over 30 minutes

## 2014-10-09 NOTE — Discharge Instructions (Signed)
Anemia, Nonspecific Anemia is a condition in which the concentration of red blood cells or hemoglobin in the blood is below normal. Hemoglobin is a substance in red blood cells that carries oxygen to the tissues of the body. Anemia results in not enough oxygen reaching these tissues.  CAUSES  Common causes of anemia include:   Excessive bleeding. Bleeding may be internal or external. This includes excessive bleeding from periods (in women) or from the intestine.   Poor nutrition.   Chronic kidney, thyroid, and liver disease.  Bone marrow disorders that decrease red blood cell production.  Cancer and treatments for cancer.  HIV, AIDS, and their treatments.  Spleen problems that increase red blood cell destruction.  Blood disorders.  Excess destruction of red blood cells due to infection, medicines, and autoimmune disorders. SIGNS AND SYMPTOMS   Minor weakness.   Dizziness.   Headache.  Palpitations.   Shortness of breath, especially with exercise.   Paleness.  Cold sensitivity.  Indigestion.  Nausea.  Difficulty sleeping.  Difficulty concentrating. Symptoms may occur suddenly or they may develop slowly.  DIAGNOSIS  Additional blood tests are often needed. These help your health care provider determine the best treatment. Your health care provider will check your stool for blood and look for other causes of blood loss.  TREATMENT  Treatment varies depending on the cause of the anemia. Treatment can include:   Supplements of iron, vitamin J57, or folic acid.   Hormone medicines.   A blood transfusion. This may be needed if blood loss is severe.   Hospitalization. This may be needed if there is significant continual blood loss.   Dietary changes.  Spleen removal. HOME CARE INSTRUCTIONS Keep all follow-up appointments. It often takes many weeks to correct anemia, and having your health care provider check on your condition and your response to  treatment is very important. SEEK IMMEDIATE MEDICAL CARE IF:   You develop extreme weakness, shortness of breath, or chest pain.   You become dizzy or have trouble concentrating.  You develop heavy vaginal bleeding.   You develop a rash.   You have bloody or black, tarry stools.   You faint.   You vomit up blood.   You vomit repeatedly.   You have abdominal pain.  You have a fever or persistent symptoms for more than 2-3 days.   You have a fever and your symptoms suddenly get worse.   You are dehydrated.  MAKE SURE YOU:  Understand these instructions.  Will watch your condition.  Will get help right away if you are not doing well or get worse. Document Released: 10/15/2004 Document Revised: 05/10/2013 Document Reviewed: 03/03/2013 [facility] Patient Information 2015 [place], [place]. This information is not intended to replace advice given to you by your health care provider. Make sure you discuss any questions you have with your health care provider. Pneumonia Pneumonia is an infection of the lungs.  CAUSES Pneumonia may be caused by bacteria or a virus. Usually, these infections are caused by breathing infectious particles into the lungs (respiratory tract). SIGNS AND SYMPTOMS   Cough.  Fever.  Chest pain.  Increased rate of breathing.  Wheezing.  Mucus production. DIAGNOSIS  If you have the common symptoms of pneumonia, your health care provider will typically confirm the diagnosis with a chest X-ray. The X-ray will show an abnormality in the lung (pulmonary infiltrate) if you have pneumonia. Other tests of your blood, urine, or sputum may be done to find the specific cause of  your pneumonia. Your health care provider may also do tests (blood gases or pulse oximetry) to see how well your lungs are working. TREATMENT  Some forms of pneumonia may be spread to other people when you cough or sneeze. You may be asked to wear a mask before and during your  exam. Pneumonia that is caused by bacteria is treated with antibiotic medicine. Pneumonia that is caused by the influenza virus may be treated with an antiviral medicine. Most other viral infections must run their course. These infections will not respond to antibiotics.  HOME CARE INSTRUCTIONS   Cough suppressants may be used if you are losing too much rest. However, coughing protects you by clearing your lungs. You should avoid using cough suppressants if you can.  Your health care provider may have prescribed medicine if he or she thinks your pneumonia is caused by bacteria or influenza. Finish your medicine even if you start to feel better.  Your health care provider may also prescribe an expectorant. This loosens the mucus to be coughed up.  Take medicines only as directed by your health care provider.  Do not smoke. Smoking is a common cause of bronchitis and can contribute to pneumonia. If you are a smoker and continue to smoke, your cough may last several weeks after your pneumonia has cleared.  A cold steam vaporizer or humidifier in your room or home may help loosen mucus.  Coughing is often worse at night. Sleeping in a semi-upright position in a recliner or using a couple pillows under your head will help with this.  Get rest as you feel it is needed. Your body will usually let you know when you need to rest. PREVENTION A pneumococcal shot (vaccine) is available to prevent a common bacterial cause of pneumonia. This is usually suggested for:  People over 79 years old.  Patients on chemotherapy.  People with chronic lung problems, such as bronchitis or emphysema.  People with immune system problems. If you are over 65 or have a high risk condition, you may receive the pneumococcal vaccine if you have not received it before. In some countries, a routine influenza vaccine is also recommended. This vaccine can help prevent some cases of pneumonia.You may be offered the influenza  vaccine as part of your care. If you smoke, it is time to quit. You may receive instructions on how to stop smoking. Your health care provider can provide medicines and counseling to help you quit. SEEK MEDICAL CARE IF: You have a fever. SEEK IMMEDIATE MEDICAL CARE IF:   Your illness becomes worse. This is especially true if you are elderly or weakened from any other disease.  You cannot control your cough with suppressants and are losing sleep.  You begin coughing up blood.  You develop pain which is getting worse or is uncontrolled with medicines.  Any of the symptoms which initially brought you in for treatment are getting worse rather than better.  You develop shortness of breath or chest pain. MAKE SURE YOU:   Understand these instructions.  Will watch your condition.  Will get help right away if you are not doing well or get worse. Document Released: 09/07/2005 Document Revised: 01/22/2014 Document Reviewed: 11/27/2010 [facility] Patient Information 2015 [place], [place]. This information is not intended to replace advice given to you by your health care provider. Make sure you discuss any questions you have with your health care provider.

## 2014-10-09 NOTE — Progress Notes (Signed)
Patient given discharge instructions, and verbalized an understanding of all discharge instructions.  Patient agrees with discharge plan, and is being discharged in stable medical condition.  Patient's blood sugars have been lower off of TNA, Dr. Charlies Silvers made aware.  Patient is eating a moderate amount and tolerating food without difficulties.  Patient will be given transportation via wheelchair.  Durwin Nora RN

## 2014-10-09 NOTE — Progress Notes (Signed)
Patient ID: Melinda Conrad, female   DOB: 1951/08/28, 64 y.o.   MRN: [id number] 11 Days Post-Op   Subjective: Doing better on soft diet. No n/v. Pain improving.  Objective: Vital signs in last 24 hours: Temp:  [98.3 F (36.8 C)-98.9 F (37.2 C)] 98.8 F (37.1 C) (01/19 0626) Pulse Rate:  [64-74] 74 (01/19 0626) Resp:  [16-20] 18 (01/19 0626) BP: (121-128)/(66-69) 121/68 mmHg (01/19 0626) SpO2:  [99 %-100 %] 99 % (01/19 0626) Weight:  [127 lb 3.3 oz (57.7 kg)] 127 lb 3.3 oz (57.7 kg) (01/19 0626) Last BM Date: 10/04/14  Intake/Output from previous day: 01/18 0701 - 01/19 0700 In: 480 [P.O.:480] Out: -  Intake/Output this shift: Total I/O In: 10 [I.V.:10] Out: -   General: Cachectic, NAD Resp: breathing comfortably GI: soft, nontender  Lab Results:   Recent Labs  10/08/14 0332  WBC 5.5  HGB 8.6*  HCT 26.3*  PLT 177   BMET  Recent Labs  10/07/14 0444 10/08/14 0332  NA 135 132*  K 4.0 3.7  CL 103 101  CO2 29 26  GLUCOSE 92 135*  BUN 8 7  CREATININE 0.39* 0.42*  CALCIUM 7.7* 7.8*   PT/INR No results for input(s): LABPROT, INR in the last 72 hours. ABG No results for input(s): PHART, HCO3 in the last 72 hours.  Invalid input(s): PCO2, PO2  Studies/Results: No results found.  Anti-infectives: Anti-infectives    Start     Dose/Rate Route Frequency Ordered Stop   09/26/14 1400  piperacillin-tazobactam (ZOSYN) IVPB 3.375 g  Status:  Discontinued     3.375 g12.5 mL/hr over 240 Minutes Intravenous 3 times per day 09/26/14 1342 09/28/14 1649   09/17/14 1200  cefTRIAXone (ROCEPHIN) 2 g in dextrose 5 % 50 mL IVPB - Premix  Status:  Discontinued     2 g100 mL/hr over 30 Minutes Intravenous Every 24 hours 09/17/14 1013 09/26/14 1315   09/15/14 1800  piperacillin-tazobactam (ZOSYN) IVPB 3.375 g  Status:  Discontinued     3.375 g12.5 mL/hr over 240 Minutes Intravenous Every 8 hours 09/15/14 1741 09/17/14 1013   09/14/14 2245  cefTRIAXone (ROCEPHIN) 1 g in  dextrose 5 % 50 mL IVPB  Status:  Discontinued     1 g100 mL/hr over 30 Minutes Intravenous Every 24 hours 09/14/14 2238 09/15/14 1730      Assessment/Plan: s/p Procedure(s): ESOPHAGOGASTRODUODENOSCOPY (EGD) (N/A) Continue soft diet and encouraged intake. Monitor blood sugar Carafate.   Off TNA. D/c home when PO intake is okay. Calorie counts.   LOS: 25 days    Chester Holstein 10/09/2014

## 2014-10-11 ENCOUNTER — Telehealth: Payer: Self-pay | Admitting: *Deleted

## 2014-10-11 ENCOUNTER — Encounter: Payer: Self-pay | Admitting: *Deleted

## 2014-10-11 ENCOUNTER — Other Ambulatory Visit: Payer: Self-pay | Admitting: *Deleted

## 2014-10-11 ENCOUNTER — Ambulatory Visit: Payer: Medicaid Other

## 2014-10-11 ENCOUNTER — Other Ambulatory Visit: Payer: Medicaid Other

## 2014-10-11 DIAGNOSIS — C257 Malignant neoplasm of other parts of pancreas: Secondary | ICD-10-CM

## 2014-10-11 MED ORDER — PROCHLORPERAZINE MALEATE 5 MG PO TABS: ORAL_TABLET | ORAL | Status: DC

## 2014-10-11 NOTE — Progress Notes (Signed)
Was on schedule for lab/Gemzar today-did not come. Just discharged from hospital 10/09/14 with EGD performed on 09/28/14. No follow up on oncology schedule.

## 2014-10-11 NOTE — Progress Notes (Signed)
Per Dr. Benay Spice : Schedule for follow up week of 10/22/14 with BS or Ned Card.

## 2014-10-11 NOTE — Telephone Encounter (Signed)
NO VOMITING OR DIARRHEA. PT. IS NOT EATING OR DRINKING. VERBAL ORDER AND READ BACK TO DR.SHERRILL- SEE MEDICATION LIST FOR NEW ORDER. PT. WILL BE SCHEDULED TO SEE DR.Pelham IN February. SCHEDULER WILL CALL PT. WITH AN APPOINTMENT. IF PT.'S CONDITION WORSEN SHE NEEDS TO GO TO THE EMERGENCY ROOM TO BE EVALUATED. NOTIFIED TIFFANY. SHE VOICES UNDERSTANDING.

## 2014-10-12 ENCOUNTER — Telehealth: Payer: Self-pay | Admitting: Oncology

## 2014-10-12 NOTE — Telephone Encounter (Signed)
bother #s not in service....mailed pt appt sched/avs and letter

## 2014-10-16 ENCOUNTER — Inpatient Hospital Stay (HOSPITAL_COMMUNITY)
Admission: EM | Admit: 2014-10-16 | Discharge: 2014-10-23 | DRG: 871 | Disposition: A | Discharge status: Home Health | Payer: Medicaid Other | Attending: Internal Medicine | Admitting: Internal Medicine

## 2014-10-16 ENCOUNTER — Emergency Department (HOSPITAL_COMMUNITY): Payer: Medicaid Other

## 2014-10-16 ENCOUNTER — Inpatient Hospital Stay (HOSPITAL_COMMUNITY): Payer: Medicaid Other

## 2014-10-16 ENCOUNTER — Encounter (HOSPITAL_COMMUNITY): Payer: Self-pay | Admitting: Emergency Medicine

## 2014-10-16 DIAGNOSIS — F17210 Nicotine dependence, cigarettes, uncomplicated: Secondary | ICD-10-CM | POA: Diagnosis present

## 2014-10-16 DIAGNOSIS — A4159 Other Gram-negative sepsis: Principal | ICD-10-CM | POA: Diagnosis present

## 2014-10-16 DIAGNOSIS — Z923 Personal history of irradiation: Secondary | ICD-10-CM | POA: Diagnosis not present

## 2014-10-16 DIAGNOSIS — B1920 Unspecified viral hepatitis C without hepatic coma: Secondary | ICD-10-CM | POA: Diagnosis present

## 2014-10-16 DIAGNOSIS — IMO0001 Reserved for inherently not codable concepts without codable children: Secondary | ICD-10-CM | POA: Insufficient documentation

## 2014-10-16 DIAGNOSIS — Z79899 Other long term (current) drug therapy: Secondary | ICD-10-CM

## 2014-10-16 DIAGNOSIS — F32A Depression, unspecified: Secondary | ICD-10-CM | POA: Diagnosis present

## 2014-10-16 DIAGNOSIS — E876 Hypokalemia: Secondary | ICD-10-CM | POA: Diagnosis present

## 2014-10-16 DIAGNOSIS — R109 Unspecified abdominal pain: Secondary | ICD-10-CM

## 2014-10-16 DIAGNOSIS — E43 Unspecified severe protein-calorie malnutrition: Secondary | ICD-10-CM | POA: Diagnosis present

## 2014-10-16 DIAGNOSIS — Z681 Body mass index (BMI) 19 or less, adult: Secondary | ICD-10-CM | POA: Diagnosis not present

## 2014-10-16 DIAGNOSIS — D630 Anemia in neoplastic disease: Secondary | ICD-10-CM | POA: Diagnosis present

## 2014-10-16 DIAGNOSIS — R748 Abnormal levels of other serum enzymes: Secondary | ICD-10-CM | POA: Diagnosis present

## 2014-10-16 DIAGNOSIS — R509 Fever, unspecified: Secondary | ICD-10-CM | POA: Diagnosis present

## 2014-10-16 DIAGNOSIS — E871 Hypo-osmolality and hyponatremia: Secondary | ICD-10-CM | POA: Insufficient documentation

## 2014-10-16 DIAGNOSIS — K219 Gastro-esophageal reflux disease without esophagitis: Secondary | ICD-10-CM | POA: Diagnosis present

## 2014-10-16 DIAGNOSIS — R7881 Bacteremia: Secondary | ICD-10-CM | POA: Diagnosis present

## 2014-10-16 DIAGNOSIS — C259 Malignant neoplasm of pancreas, unspecified: Secondary | ICD-10-CM | POA: Diagnosis present

## 2014-10-16 DIAGNOSIS — G8929 Other chronic pain: Secondary | ICD-10-CM | POA: Diagnosis present

## 2014-10-16 DIAGNOSIS — M810 Age-related osteoporosis without current pathological fracture: Secondary | ICD-10-CM | POA: Diagnosis present

## 2014-10-16 DIAGNOSIS — A419 Sepsis, unspecified organism: Secondary | ICD-10-CM

## 2014-10-16 DIAGNOSIS — I10 Essential (primary) hypertension: Secondary | ICD-10-CM | POA: Diagnosis present

## 2014-10-16 DIAGNOSIS — I81 Portal vein thrombosis: Secondary | ICD-10-CM | POA: Diagnosis present

## 2014-10-16 DIAGNOSIS — Z90411 Acquired partial absence of pancreas: Secondary | ICD-10-CM | POA: Diagnosis present

## 2014-10-16 DIAGNOSIS — R1084 Generalized abdominal pain: Secondary | ICD-10-CM

## 2014-10-16 DIAGNOSIS — B961 Klebsiella pneumoniae [K. pneumoniae] as the cause of diseases classified elsewhere: Secondary | ICD-10-CM | POA: Diagnosis present

## 2014-10-16 DIAGNOSIS — F329 Major depressive disorder, single episode, unspecified: Secondary | ICD-10-CM | POA: Diagnosis present

## 2014-10-16 DIAGNOSIS — R6521 Severe sepsis with septic shock: Secondary | ICD-10-CM | POA: Diagnosis present

## 2014-10-16 DIAGNOSIS — B954 Other streptococcus as the cause of diseases classified elsewhere: Secondary | ICD-10-CM | POA: Diagnosis present

## 2014-10-16 HISTORY — DX: Bacteremia: R78.81

## 2014-10-16 HISTORY — DX: Acute respiratory failure with hypoxia: J96.01

## 2014-10-16 HISTORY — DX: Thrombocytopenia, unspecified: D69.6

## 2014-10-16 HISTORY — DX: Portal vein thrombosis: I81

## 2014-10-16 HISTORY — DX: Acute posthemorrhagic anemia: D62

## 2014-10-16 HISTORY — DX: Other shock: R57.8

## 2014-10-16 HISTORY — DX: Gastrointestinal hemorrhage, unspecified: K92.2

## 2014-10-16 HISTORY — DX: Klebsiella pneumoniae (k. pneumoniae) as the cause of diseases classified elsewhere: B96.1

## 2014-10-16 LAB — CBC WITH DIFFERENTIAL/PLATELET
Basophils Absolute: 0 10*3/uL (ref 0.0–0.1)
Basophils Relative: 0 % (ref 0–1)
EOS PCT: 0 % (ref 0–5)
Eosinophils Absolute: 0 10*3/uL (ref 0.0–0.7)
HCT: 30.1 % — ABNORMAL LOW (ref 36.0–46.0)
Hemoglobin: 9.6 g/dL — ABNORMAL LOW (ref 12.0–15.0)
LYMPHS ABS: 0.6 10*3/uL — AB (ref 0.7–4.0)
LYMPHS PCT: 4 % — AB (ref 12–46)
MCH: 28.9 pg (ref 26.0–34.0)
MCHC: 31.9 g/dL (ref 30.0–36.0)
MCV: 90.7 fL (ref 78.0–100.0)
Monocytes Absolute: 0.6 10*3/uL (ref 0.1–1.0)
Monocytes Relative: 4 % (ref 3–12)
NEUTROS PCT: 92 % — AB (ref 43–77)
Neutro Abs: 13.6 10*3/uL — ABNORMAL HIGH (ref 1.7–7.7)
Platelets: 233 10*3/uL (ref 150–400)
RBC: 3.32 MIL/uL — ABNORMAL LOW (ref 3.87–5.11)
RDW: 15.4 % (ref 11.5–15.5)
WBC: 14.8 10*3/uL — AB (ref 4.0–10.5)

## 2014-10-16 LAB — COMPREHENSIVE METABOLIC PANEL
ALBUMIN: 2.1 g/dL — AB (ref 3.5–5.2)
ALT: 24 U/L (ref 0–35)
ANION GAP: 8 (ref 5–15)
AST: 35 U/L (ref 0–37)
Alkaline Phosphatase: 455 U/L — ABNORMAL HIGH (ref 39–117)
BUN: 10 mg/dL (ref 6–23)
CALCIUM: 8 mg/dL — AB (ref 8.4–10.5)
CHLORIDE: 103 mmol/L (ref 96–112)
CO2: 18 mmol/L — AB (ref 19–32)
Creatinine, Ser: 0.6 mg/dL (ref 0.50–1.10)
GFR calc Af Amer: >90 mL/min (ref >90)
GFR calc non Af Amer: >90 mL/min (ref >90)
Glucose, Bld: 143 mg/dL — ABNORMAL HIGH (ref 70–99)
Potassium: 3.7 mmol/L (ref 3.5–5.1)
Sodium: 129 mmol/L — ABNORMAL LOW (ref 135–145)
Total Bilirubin: 0.6 mg/dL (ref 0.3–1.2)
Total Protein: 9 g/dL — ABNORMAL HIGH (ref 6.0–8.3)

## 2014-10-16 LAB — URINALYSIS, ROUTINE W REFLEX MICROSCOPIC
Glucose, UA: 100 mg/dL — AB
Hgb urine dipstick: NEGATIVE
Ketones, ur: NEGATIVE mg/dL
Nitrite: NEGATIVE
PROTEIN: 100 mg/dL — AB
Specific Gravity, Urine: 1.03 (ref 1.005–1.030)
Urobilinogen, UA: 1 mg/dL (ref 0.0–1.0)
pH: 5.5 (ref 5.0–8.0)

## 2014-10-16 LAB — URINE MICROSCOPIC-ADD ON

## 2014-10-16 LAB — MRSA PCR SCREENING: MRSA BY PCR: NEGATIVE

## 2014-10-16 LAB — LIPASE, BLOOD: LIPASE: 14 U/L (ref 11–59)

## 2014-10-16 LAB — LACTIC ACID, PLASMA: Lactic Acid, Venous: 2 mmol/L (ref 0.5–2.0)

## 2014-10-16 MED ORDER — VANCOMYCIN HCL IN DEXTROSE 1-5 GM/200ML-% IV SOLN
1000.0000 mg | Freq: Once | INTRAVENOUS | Status: AC
  Administered 2014-10-16: 1000 mg via INTRAVENOUS
  Filled 2014-10-16: qty 200

## 2014-10-16 MED ORDER — SERTRALINE HCL 100 MG PO TABS
100.0000 mg | ORAL_TABLET | Freq: Every morning | ORAL | Status: DC
  Administered 2014-10-17 – 2014-10-23 (×7): 100 mg via ORAL
  Filled 2014-10-16 (×7): qty 1

## 2014-10-16 MED ORDER — SODIUM CHLORIDE 0.9 % IV BOLUS (SEPSIS)
500.0000 mL | Freq: Once | INTRAVENOUS | Status: AC
  Administered 2014-10-16: 500 mL via INTRAVENOUS

## 2014-10-16 MED ORDER — PREGABALIN 25 MG PO CAPS
25.0000 mg | ORAL_CAPSULE | Freq: Every day | ORAL | Status: DC
  Administered 2014-10-16 – 2014-10-22 (×7): 25 mg via ORAL
  Filled 2014-10-16 (×7): qty 1

## 2014-10-16 MED ORDER — PIPERACILLIN-TAZOBACTAM 3.375 G IVPB
3.3750 g | Freq: Three times a day (TID) | INTRAVENOUS | Status: DC
  Administered 2014-10-17 – 2014-10-21 (×14): 3.375 g via INTRAVENOUS
  Filled 2014-10-16 (×15): qty 50

## 2014-10-16 MED ORDER — ACETAMINOPHEN 650 MG RE SUPP: 650.0000 mg | Freq: Four times a day (QID) | RECTAL | Status: DC | PRN

## 2014-10-16 MED ORDER — OXYCODONE-ACETAMINOPHEN 5-325 MG PO TABS
1.0000 | ORAL_TABLET | Freq: Four times a day (QID) | ORAL | Status: DC | PRN
  Administered 2014-10-16 – 2014-10-23 (×14): 1 via ORAL
  Filled 2014-10-16 (×14): qty 1

## 2014-10-16 MED ORDER — ENOXAPARIN SODIUM 40 MG/0.4ML ~~LOC~~ SOLN
40.0000 mg | SUBCUTANEOUS | Status: DC
  Administered 2014-10-16 – 2014-10-22 (×7): 40 mg via SUBCUTANEOUS
  Filled 2014-10-16 (×8): qty 0.4

## 2014-10-16 MED ORDER — SODIUM CHLORIDE 0.9 % IV BOLUS (SEPSIS)
1000.0000 mL | Freq: Once | INTRAVENOUS | Status: AC
  Administered 2014-10-16: 1000 mL via INTRAVENOUS

## 2014-10-16 MED ORDER — ONDANSETRON HCL 4 MG/2ML IJ SOLN
4.0000 mg | Freq: Once | INTRAMUSCULAR | Status: AC
  Administered 2014-10-16: 4 mg via INTRAVENOUS
  Filled 2014-10-16: qty 2

## 2014-10-16 MED ORDER — IOHEXOL 300 MG/ML  SOLN
100.0000 mL | Freq: Once | INTRAMUSCULAR | Status: AC | PRN
  Administered 2014-10-16: 100 mL via INTRAVENOUS

## 2014-10-16 MED ORDER — ONDANSETRON HCL 4 MG PO TABS: 4.0000 mg | ORAL_TABLET | Freq: Four times a day (QID) | ORAL | Status: DC | PRN

## 2014-10-16 MED ORDER — VANCOMYCIN HCL IN DEXTROSE 750-5 MG/150ML-% IV SOLN
750.0000 mg | Freq: Two times a day (BID) | INTRAVENOUS | Status: DC
  Administered 2014-10-17 – 2014-10-21 (×9): 750 mg via INTRAVENOUS
  Filled 2014-10-16 (×12): qty 150

## 2014-10-16 MED ORDER — HYDROMORPHONE HCL 1 MG/ML IJ SOLN
1.0000 mg | Freq: Once | INTRAMUSCULAR | Status: AC
  Administered 2014-10-16: 1 mg via INTRAVENOUS
  Filled 2014-10-16: qty 1

## 2014-10-16 MED ORDER — PIPERACILLIN-TAZOBACTAM 3.375 G IVPB 30 MIN
3.3750 g | Freq: Once | INTRAVENOUS | Status: AC
  Administered 2014-10-16: 3.375 g via INTRAVENOUS
  Filled 2014-10-16: qty 50

## 2014-10-16 MED ORDER — ONDANSETRON HCL 4 MG/2ML IJ SOLN
4.0000 mg | Freq: Four times a day (QID) | INTRAMUSCULAR | Status: DC | PRN
Start: 2014-10-16 — End: 2014-10-23

## 2014-10-16 MED ORDER — SODIUM CHLORIDE 0.9 % IV SOLN: INTRAVENOUS | Status: DC

## 2014-10-16 MED ORDER — FENTANYL 25 MCG/HR TD PT72
25.0000 ug | MEDICATED_PATCH | TRANSDERMAL | Status: DC
  Administered 2014-10-16 – 2014-10-22 (×3): 25 ug via TRANSDERMAL
  Filled 2014-10-16 (×3): qty 1

## 2014-10-16 MED ORDER — ACETAMINOPHEN 325 MG PO TABS: 650.0000 mg | ORAL_TABLET | Freq: Four times a day (QID) | ORAL | Status: DC | PRN

## 2014-10-16 MED ORDER — SODIUM CHLORIDE 0.9 % IJ SOLN
3.0000 mL | Freq: Two times a day (BID) | INTRAMUSCULAR | Status: DC
  Administered 2014-10-16 – 2014-10-22 (×4): 3 mL via INTRAVENOUS

## 2014-10-16 MED ORDER — SUCRALFATE 1 GM/10ML PO SUSP
1.0000 g | Freq: Three times a day (TID) | ORAL | Status: DC
Start: 2014-10-16 — End: 2014-10-23
  Administered 2014-10-17 – 2014-10-23 (×26): 1 g via ORAL
  Filled 2014-10-16 (×29): qty 10

## 2014-10-16 MED ORDER — ENSURE COMPLETE PO LIQD
237.0000 mL | Freq: Three times a day (TID) | ORAL | Status: DC
Start: 2014-10-16 — End: 2014-10-23
  Administered 2014-10-17 – 2014-10-23 (×11): 237 mL via ORAL

## 2014-10-16 NOTE — Progress Notes (Signed)
EDCM spoke to patient at bedside.  Patient reports she lives alone in "My own apartment."  Patient has a daughter who checks  in on her frequently and also a friend who assists the patient with ADL's and walking to the bathroom.  Patient has a walker at home.  Patient reports she would like a wheelchair and and a shower chair at home.  Patient reports she has a nurse who comes twice a week, "She checks my vitals."  Upon chart review, on previous admission, patient was enrolled with [facility] for RN, PT, OT, aide and disease management.  Patient was admitted and discharged from hospital from 12/25 to 01/19.  Patient confirms her pcp is Dr. Marlou Sa.  Patient reports since she has been discharged from the hospital, she has not seen any doctors.  She has not received a follow up phone call from her pcp.  This information was placed in readmission focus note.  No further EDCM needs at this time.

## 2014-10-16 NOTE — H&P (Signed)
[facility] History and Physical  Melinda Conrad HXT:[id number] DOB: [birth year] DOA: 10/16/2014  Referring physician: er PCP: Marlou Sa ERIC, MD   Chief Complaint: fever  HPI: Melinda Conrad is a 64 y.o. female  Who was d/c'd from the hospital with septic shock with Klebsiella pneu bacteremia on 1/19.  Patient was seen by home health nurse today who said her temperature was 104.  She was sent to the ER by Dr. Barry Dienes.  She has been "taking it easy" since discharge.  No chills.  Her BP was found to be 80s-90s, which is what she says her baseline is.  Patient has been c/o epigastric pain worse in the last few days.    In the ER, CT scan ordered but not done.  Patient had elevated alk phos.  WBC count was elevated.  Lactic acid was normal Blood cultures ordered.     Review of Systems:  All systems reviewed, negative unless stated above    Past Medical History  Diagnosis Date  . Arthritis   . Depression   . H/O: substance abuse   . Back pain   . Personal history of colonic polyp - adenoma 10/26/2013    10/26/2013 diminutive sigmoid polyp removed    . Pancreatitis, acute   . Hypertension   . Osteoporosis   . Radiation 04/30/14-06/08/14    pancreas 50 gray  . Cancer     pancreatic cancer   Past Surgical History  Procedure Laterality Date  . Lumbar epidural injection    . Colonoscopy    . Ercp N/A 03/06/2014    Procedure: ENDOSCOPIC RETROGRADE CHOLANGIOPANCREATOGRAPHY (ERCP);  Surgeon: Gatha Mayer, MD;  Location: [facility] ENDOSCOPY;  Service: Endoscopy;  Laterality: N/A;  . Eus N/A 04/05/2014    Procedure: UPPER ENDOSCOPIC ULTRASOUND (EUS) RADIAL;  Surgeon: [name], MD;  Location: [facility] ENDOSCOPY;  Service: Endoscopy;  Laterality: N/A;  . Whipple procedure N/A 08/07/2014    Procedure:  DIAGNOSTIC LAPAROSCOPY, WHIPPLE PROCEDURE;  Surgeon: Stark Klein, MD;  Location: [facility] ORS;  Service: General;  Laterality: N/A;  . Esophagogastroduodenoscopy N/A 09/28/2014    Procedure:  ESOPHAGOGASTRODUODENOSCOPY (EGD);  Surgeon: Ladene Artist, MD;  Location: [facility] ENDOSCOPY;  Service: Endoscopy;  Laterality: N/A;   Social History:  reports that she has been smoking Cigarettes.  She has a 10 pack-year smoking history. She uses smokeless tobacco. She reports that she does not drink alcohol or use illicit drugs.  No Known Allergies  Family History  Problem Relation Age of Onset  . Colon cancer Neg Hx   . Esophageal cancer Neg Hx   . Rectal cancer Neg Hx   . Stomach cancer Neg Hx   . CAD Mother   . Cancer Father   . Cirrhosis Sister     she is a heavy drinker.     Prior to Admission medications   Medication Sig Start Date End Date Taking? Authorizing Provider  CALCIUM-VITAMIN D PO Take 1 capsule by mouth daily.   Yes Historical Provider, MD  feeding supplement, ENSURE COMPLETE, (ENSURE COMPLETE) LIQD Take 237 mLs by mouth 3 (three) times daily between meals. 10/09/14  Yes Robbie Lis, MD  ondansetron (ZOFRAN) 4 MG tablet Take 1 tablet (4 mg total) by mouth every 8 (eight) hours as needed for nausea or vomiting. 10/09/14  Yes Robbie Lis, MD  oxyCODONE-acetaminophen (PERCOCET/ROXICET) 5-325 MG per tablet Take 1-2 tablets by mouth every 4 (four) hours as needed for moderate pain. 10/09/14  Yes Alma  Vivi Ferns, MD  pantoprazole (PROTONIX) 40 MG tablet Take 1 tablet (40 mg total) by mouth 2 (two) times daily. Patient taking differently: Take 40 mg by mouth 2 (two) times daily as needed (heartburn).  10/09/14  Yes Robbie Lis, MD  potassium chloride SA (K-DUR,KLOR-CON) 20 MEQ tablet Take 1 tablet (20 mEq total) by mouth daily. 10/09/14  Yes Robbie Lis, MD  sertraline (ZOLOFT) 100 MG tablet Take 100 mg by mouth every morning.    Yes Historical Provider, MD  sucralfate (CARAFATE) 1 GM/10ML suspension Take 10 mLs (1 g total) by mouth 4 (four) times daily -  with meals and at bedtime. 10/09/14  Yes Robbie Lis, MD  antiseptic oral rinse (CPC / CETYLPYRIDINIUM CHLORIDE 0.05%) 0.05  % LIQD solution 7 mLs by Mouth Rinse route 2 (two) times daily. 10/09/14   Robbie Lis, MD  fentaNYL (DURAGESIC - DOSED MCG/HR) 25 MCG/HR patch Place 1 patch (25 mcg total) onto the skin every 3 (three) days. 10/09/14   Robbie Lis, MD  pregabalin (LYRICA) 25 MG capsule Take 1 capsule (25 mg total) by mouth at bedtime. 10/09/14   Robbie Lis, MD  prochlorperazine (COMPAZINE) 5 MG tablet TAKE ONE TO TWO TABS EVERY SIX HOURS PRN. 10/11/14   Ladell Pier, MD   Physical Exam: Filed Vitals:   10/16/14 1243 10/16/14 1545  BP: 98/67 86/57  Pulse: 115 79  Temp: 97.5 F (36.4 C) 98.2 F (36.8 C)  TempSrc: Oral Oral  Resp: 16 18  SpO2: 98% 100%    Wt Readings from Last 3 Encounters:  10/09/14 57.7 kg (127 lb 3.3 oz)  09/10/14 49.714 kg (109 lb 9.6 oz)  08/07/14 53.7 kg (118 lb 6.2 oz)    General:  Appears calm and comfortable- reading a book Eyes: PERRL, normal lids, irises & conjunctiva ENT: grossly normal hearing, lips & tongue Neck: no LAD, masses or thyromegaly Cardiovascular: RRR, no m/r/g. No LE edema. Respiratory: CTA bilaterally, no w/r/r. Normal respiratory effort. Abdomen: soft, ntnd, midline incision attached Skin: no rash or induration seen on limited exam Musculoskeletal: grossly normal tone BUE/BLE Psychiatric: grossly normal mood and affect, speech fluent and appropriate Neurologic: grossly non-focal.          Labs on Admission:  Basic Metabolic Panel:  Recent Labs Lab 10/16/14 1339  NA 129*  K 3.7  CL 103  CO2 18*  GLUCOSE 143*  BUN 10  CREATININE 0.60  CALCIUM 8.0*   Liver Function Tests:  Recent Labs Lab 10/16/14 1339  AST 35  ALT 24  ALKPHOS 455*  BILITOT 0.6  PROT 9.0*  ALBUMIN 2.1*    Recent Labs Lab 10/16/14 1339  LIPASE 14   No results for input(s): AMMONIA in the last 168 hours. CBC:  Recent Labs Lab 10/16/14 1339  WBC 14.8*  NEUTROABS 13.6*  HGB 9.6*  HCT 30.1*  MCV 90.7  PLT 233   Cardiac Enzymes: No results for  input(s): CKTOTAL, CKMB, CKMBINDEX, TROPONINI in the last 168 hours.  BNP (last 3 results) No results for input(s): PROBNP in the last 8760 hours. CBG: No results for input(s): GLUCAP in the last 168 hours.  Radiological Exams on Admission: Dg Chest 2 View  10/16/2014   CLINICAL DATA:  Fever. History of pancreatic carcinoma. Hypertension.  EXAM: CHEST  2 VIEW  COMPARISON:  October 02, 2014  FINDINGS: There is been interval clearing of interstitial edema. There is some mild fibrotic change scattered throughout the  lungs, stable. No edema or consolidation currently. Heart size and pulmonary vascularity are normal. No adenopathy. No bone lesions.  IMPRESSION: Areas of scattered interstitial fibrosis. Recent interstitial edema has cleared. Currently no edema or consolidation. No effusions.   Electronically Signed   By: Lowella Grip M.D.   On: 10/16/2014 14:05      Assessment/Plan Active Problems:   Protein-calorie malnutrition, severe   Septic shock   Hyponatremia   Elevated alkaline phosphatase level    Patient will be admitted to step down unit.  Patient was just discharged after septic shock.  BP appears to be at baseline.  Will get blood cultures and start broad spectrum abx- vanc/zosyn- quick to de-escalate if no cause found -await urine culture- not impressive -no fever since being here -await abd/pelvis  CT scan- surgical consult if needed  Elevated alk phos- check U/S  Hyponatremia- IVF and recheck in AM  Protein calorie malnutrition- PO supplements  Leukocytosis- monitor  Code Status: full DVT Prophylaxis: Family Communication: patient Disposition Plan:   Time spent: 75 min  Eulogio Bear [facility] Pager [phone]

## 2014-10-16 NOTE — Progress Notes (Signed)
Utilization Review completed.  Shawneen Deetz RN CM  

## 2014-10-16 NOTE — ED Notes (Signed)
Patient transported to X-ray 

## 2014-10-16 NOTE — Progress Notes (Signed)
ANTIBIOTIC CONSULT NOTE - INITIAL  Pharmacy Consult for Vancomycin & Zosyn Indication: Sepsis  No Known Allergies  Patient Measurements: Height : 63 inches Weight : 57.7 kg  Vital Signs: Temp: 98.2 F (36.8 C) (01/26 1545) Temp Source: Oral (01/26 1545) BP: 84/55 mmHg (01/26 1545) Pulse Rate: 65 (01/26 1545) Intake/Output from previous day:   Intake/Output from this shift:    Labs:  Recent Labs  10/16/14 1339  WBC 14.8*  HGB 9.6*  PLT 233  CREATININE 0.60   Estimated Creatinine Clearance: 61.1 mL/min (by C-G formula based on Cr of 0.6). No results for input(s): VANCOTROUGH, VANCOPEAK, VANCORANDOM, GENTTROUGH, GENTPEAK, GENTRANDOM, TOBRATROUGH, TOBRAPEAK, TOBRARND, AMIKACINPEAK, AMIKACINTROU, AMIKACIN in the last 72 hours.   Microbiology: Recent Results (from the past 720 hour(s))  Culture, blood (routine x 2)     Status: None   Collection Time: 09/19/14  2:50 PM  Result Value Ref Range Status   Specimen Description BLOOD RIGHT ARM  Final   Special Requests BOTTLES DRAWN AEROBIC ONLY [place]  Final   Culture   Final    NO GROWTH 5 DAYS Performed at [facility]    Report Status 09/25/2014 FINAL  Final  Culture, blood (routine x 2)     Status: None   Collection Time: 09/19/14  3:05 PM  Result Value Ref Range Status   Specimen Description BLOOD RIGHT ARM  Final   Special Requests BOTTLES DRAWN AEROBIC ONLY 4CC  Final   Culture   Final    NO GROWTH 5 DAYS Performed at [facility]    Report Status 09/25/2014 FINAL  Final  Urine culture     Status: None   Collection Time: 09/20/14 10:45 AM  Result Value Ref Range Status   Specimen Description URINE, CATHETERIZED  Final   Special Requests NONE  Final   Colony Count NO GROWTH Performed at [facility]   Final   Culture NO GROWTH Performed at [facility]   Final   Report Status 09/23/2014 FINAL  Final  Culture, blood (routine x 2)     Status: None   Collection Time: 09/27/14  12:40 PM  Result Value Ref Range Status   Specimen Description BLOOD RIGHT HAND  Final   Special Requests BOTTLES DRAWN AEROBIC ONLY [place]  Final   Culture   Final    NO GROWTH 5 DAYS Performed at [facility]    Report Status 10/03/2014 FINAL  Final  Culture, blood (routine x 2)     Status: None   Collection Time: 09/27/14 12:45 PM  Result Value Ref Range Status   Specimen Description BLOOD EFT ARM  Final   Special Requests BOTTLES DRAWN AEROBIC AND ANAEROBIC 3CC  Final   Culture   Final    NO GROWTH 5 DAYS Performed at [facility]    Report Status 10/03/2014 FINAL  Final    Medical History: Past Medical History  Diagnosis Date  . Arthritis   . Depression   . H/O: substance abuse   . Back pain   . Personal history of colonic polyp - adenoma 10/26/2013    10/26/2013 diminutive sigmoid polyp removed    . Pancreatitis, acute   . Hypertension   . Osteoporosis   . Radiation 04/30/14-06/08/14    pancreas 50 gray  . Cancer     pancreatic cancer    Medications:  Scheduled:  . sodium chloride   Intravenous STAT  . enoxaparin (LOVENOX) injection  40 mg  Subcutaneous Q24H  . feeding supplement (ENSURE COMPLETE)  237 mL Oral TID BM  . fentaNYL  25 mcg Transdermal Q72H  . pregabalin  25 mg Oral QHS  . [START ON 10/17/2014] sertraline  100 mg Oral q morning - 10a  . sodium chloride  3 mL Intravenous Q12H  . sucralfate  1 g Oral TID WC & HS   Infusions:   Assessment:  64 yr female with pancreatic cancer and was to begin chemo on 1/21 (chemo not given).  Recent discharge from hospital on 1/19 where diagnosed with septic shock and Klebsiella pneumonia bacteremia.  At home temp = 104F  Currently afebrile.    Check blood and urine cultures  Zosyn 3.375gm and Vancomycin 1gm IV x 1 dose each given in ED @ 16:45  Pharmacy consulted to continue dosing of Zosyn and Vancomycin for sepsis  CrCl ~ 61 ml/min  Goal of Therapy:  Vancomycin trough level 15-20  mcg/ml  Plan:  Measure antibiotic drug levels at steady state Follow up culture results  Zosyn 3.375gm IV q8h (each dose infused over 4 hrs) Vancomycin 750mg  IV q12h  Rayvn Rickerson, Toribio Harbour, PharmD 10/16/2014,6:45 PM

## 2014-10-16 NOTE — ED Provider Notes (Signed)
CSN: [id number]     Arrival date & time 10/16/14  1232 History   First MD Initiated Contact with Patient 10/16/14 1300     Chief Complaint  Patient presents with  . Fever     (Consider location/radiation/quality/duration/timing/severity/associated sxs/prior Treatment) The history is provided by the patient and a relative.  pt with hx pancreatic cancer, s/p resection 11/15, c/o fever onset in past day, ?103 at home. No chills/sweats. Pt denies source of fever. No cvl. No headache. No cough or uri c/o. No cp or sob. No vomiting or diarrhea.  Pt does state mid to upper/epigastric pain has been worse in past few days. Describes as constant, mod-severe, non radiating. Denies dysuria or gu c/o. No rash/skin changes or abscess. No known ill contacts. Denies any prior chemo - states they were planning to start next month. No recent change in meds or new meds. No night sweats.      Past Medical History  Diagnosis Date  . Arthritis   . Depression   . H/O: substance abuse   . Back pain   . Personal history of colonic polyp - adenoma 10/26/2013    10/26/2013 diminutive sigmoid polyp removed    . Pancreatitis, acute   . Hypertension   . Osteoporosis   . Radiation 04/30/14-06/08/14    pancreas 50 gray  . Cancer     pancreatic cancer   Past Surgical History  Procedure Laterality Date  . Lumbar epidural injection    . Colonoscopy    . Ercp N/A 03/06/2014    Procedure: ENDOSCOPIC RETROGRADE CHOLANGIOPANCREATOGRAPHY (ERCP);  Surgeon: Gatha Mayer, MD;  Location: [facility] ENDOSCOPY;  Service: Endoscopy;  Laterality: N/A;  . Eus N/A 04/05/2014    Procedure: UPPER ENDOSCOPIC ULTRASOUND (EUS) RADIAL;  Surgeon: [name], MD;  Location: [facility] ENDOSCOPY;  Service: Endoscopy;  Laterality: N/A;  . Whipple procedure N/A 08/07/2014    Procedure:  DIAGNOSTIC LAPAROSCOPY, WHIPPLE PROCEDURE;  Surgeon: Stark Klein, MD;  Location: [facility] ORS;  Service: General;  Laterality: N/A;  . Esophagogastroduodenoscopy N/A  09/28/2014    Procedure: ESOPHAGOGASTRODUODENOSCOPY (EGD);  Surgeon: Ladene Artist, MD;  Location: [facility] ENDOSCOPY;  Service: Endoscopy;  Laterality: N/A;   Family History  Problem Relation Age of Onset  . Colon cancer Neg Hx   . Esophageal cancer Neg Hx   . Rectal cancer Neg Hx   . Stomach cancer Neg Hx   . CAD Mother   . Cancer Father   . Cirrhosis Sister     she is a heavy drinker.    History  Substance Use Topics  . Smoking status: Current Every Day Smoker -- 0.25 packs/day for 40 years    Types: Cigarettes  . Smokeless tobacco: Current User  . Alcohol Use: No     Comment: occasionally. Quit  , none in [place] drank before  to help with pain before ERD visit"   OB History    No data available     Review of Systems  Constitutional: Positive for fever. Negative for chills and diaphoresis.  HENT: Negative for sinus pressure and sore throat.   Eyes: Negative for redness.  Respiratory: Negative for cough and shortness of breath.   Cardiovascular: Negative for chest pain.  Gastrointestinal: Positive for abdominal pain. Negative for vomiting and diarrhea.  Endocrine: Negative for polyuria.  Genitourinary: Negative for dysuria, flank pain, vaginal bleeding and vaginal discharge.  Musculoskeletal: Negative for back pain, neck pain and neck stiffness.  Skin: Negative for rash.  Neurological: Negative for headaches.  Hematological: Does not bruise/bleed easily.  Psychiatric/Behavioral: Negative for confusion.      Allergies  Review of patient's allergies indicates no known allergies.  Home Medications   Prior to Admission medications   Medication Sig Start Date End Date Taking? Authorizing Provider  antiseptic oral rinse (CPC / CETYLPYRIDINIUM CHLORIDE 0.05%) 0.05 % LIQD solution 7 mLs by Mouth Rinse route 2 (two) times daily. 10/09/14   Robbie Lis, MD  CALCIUM-VITAMIN D PO Take 1 capsule by mouth daily.    Historical Provider, MD  feeding supplement, ENSURE COMPLETE,  (ENSURE COMPLETE) LIQD Take 237 mLs by mouth 3 (three) times daily between meals. 10/09/14   Robbie Lis, MD  fentaNYL (DURAGESIC - DOSED MCG/HR) 25 MCG/HR patch Place 1 patch (25 mcg total) onto the skin every 3 (three) days. 10/09/14   Robbie Lis, MD  ondansetron (ZOFRAN) 4 MG tablet Take 1 tablet (4 mg total) by mouth every 8 (eight) hours as needed for nausea or vomiting. 10/09/14   Robbie Lis, MD  oxyCODONE-acetaminophen (PERCOCET/ROXICET) 5-325 MG per tablet Take 1-2 tablets by mouth every 4 (four) hours as needed for moderate pain. 10/09/14   Robbie Lis, MD  pantoprazole (PROTONIX) 40 MG tablet Take 1 tablet (40 mg total) by mouth 2 (two) times daily. 10/09/14   Robbie Lis, MD  potassium chloride SA (K-DUR,KLOR-CON) 20 MEQ tablet Take 1 tablet (20 mEq total) by mouth daily. 10/09/14   Robbie Lis, MD  pregabalin (LYRICA) 25 MG capsule Take 1 capsule (25 mg total) by mouth at bedtime. 10/09/14   Robbie Lis, MD  prochlorperazine (COMPAZINE) 5 MG tablet TAKE ONE TO TWO TABS EVERY SIX HOURS PRN. 10/11/14   Ladell Pier, MD  sertraline (ZOLOFT) 100 MG tablet Take 100 mg by mouth every morning.     Historical Provider, MD  sucralfate (CARAFATE) 1 GM/10ML suspension Take 10 mLs (1 g total) by mouth 4 (four) times daily -  with meals and at bedtime. 10/09/14   Robbie Lis, MD   BP 98/67 mmHg  Pulse 115  Temp(Src) 97.5 F (36.4 C) (Oral)  Resp 16  SpO2 98% Physical Exam  Constitutional: She is oriented to person, place, and time. She appears well-developed and well-nourished. No distress.  HENT:  Nose: Nose normal.  Mouth/Throat: Oropharynx is clear and moist.  Eyes: Conjunctivae are normal. Pupils are equal, round, and reactive to light. No scleral icterus.  Neck: Neck supple. No tracheal deviation present. No thyromegaly present.  No stiffness or rigidity  Cardiovascular: Normal rate, regular rhythm, normal heart sounds and intact distal pulses.  Exam reveals no gallop and no  friction rub.   No murmur heard. Pulmonary/Chest: Effort normal and breath sounds normal. No respiratory distress.  Abdominal: Soft. Normal appearance and bowel sounds are normal. She exhibits no distension. There is tenderness. There is no rebound and no guarding.  Marked mid abd and epigastric tenderness, no rebound or guarding.  No incarc hernia.    Genitourinary:  No cva tenderness  Musculoskeletal: She exhibits no edema or tenderness.  Lymphadenopathy:    She has no cervical adenopathy.  Neurological: She is alert and oriented to person, place, and time.  Skin: Skin is warm and dry. No rash noted. She is not diaphoretic.  Psychiatric: She has a normal mood and affect.  Nursing note and vitals reviewed.   ED Course  Procedures (including critical care time) Labs Review  Results  for orders placed or performed during the hospital encounter of 10/16/14  CBC with Differential/Platelet  Result Value Ref Range   WBC 14.8 (H) 4.0 - 10.5 K/uL   RBC 3.32 (L) 3.87 - 5.11 MIL/uL   Hemoglobin 9.6 (L) 12.0 - 15.0 g/dL   HCT 30.1 (L) 36.0 - 46.0 %   MCV 90.7 78.0 - 100.0 fL   MCH 28.9 26.0 - 34.0 pg   MCHC 31.9 30.0 - 36.0 g/dL   RDW 15.4 11.5 - 15.5 %   Platelets 233 150 - 400 K/uL   Neutrophils Relative % 92 (H) 43 - 77 %   Neutro Abs 13.6 (H) 1.7 - 7.7 K/uL   Lymphocytes Relative 4 (L) 12 - 46 %   Lymphs Abs 0.6 (L) 0.7 - 4.0 K/uL   Monocytes Relative 4 3 - 12 %   Monocytes Absolute 0.6 0.1 - 1.0 K/uL   Eosinophils Relative 0 0 - 5 %   Eosinophils Absolute 0.0 0.0 - 0.7 K/uL   Basophils Relative 0 0 - 1 %   Basophils Absolute 0.0 0.0 - 0.1 K/uL  Comprehensive metabolic panel  Result Value Ref Range   Sodium 129 (L) 135 - 145 mmol/L   Potassium 3.7 3.5 - 5.1 mmol/L   Chloride 103 96 - 112 mmol/L   CO2 18 (L) 19 - 32 mmol/L   Glucose, Bld 143 (H) 70 - 99 mg/dL   BUN 10 6 - 23 mg/dL   Creatinine, Ser 0.60 0.50 - 1.10 mg/dL   Calcium 8.0 (L) 8.4 - 10.5 mg/dL   Total Protein  9.0 (H) 6.0 - 8.3 g/dL   Albumin 2.1 (L) 3.5 - 5.2 g/dL   AST 35 0 - 37 U/L   ALT 24 0 - 35 U/L   Alkaline Phosphatase 455 (H) 39 - 117 U/L   Total Bilirubin 0.6 0.3 - 1.2 mg/dL   GFR calc non Af Amer >90 >90 mL/min   GFR calc Af Amer >90 >90 mL/min   Anion gap 8 5 - 15  Lactic acid, plasma  Result Value Ref Range   Lactic Acid, Venous 2 0.5 - 2.0 mmol/L  Lipase, blood  Result Value Ref Range   Lipase 14 11 - 59 U/L      Dg Chest 2 View  10/16/2014   CLINICAL DATA:  Fever. History of pancreatic carcinoma. Hypertension.  EXAM: CHEST  2 VIEW  COMPARISON:  October 02, 2014  FINDINGS: There is been interval clearing of interstitial edema. There is some mild fibrotic change scattered throughout the lungs, stable. No edema or consolidation currently. Heart size and pulmonary vascularity are normal. No adenopathy. No bone lesions.  IMPRESSION: Areas of scattered interstitial fibrosis. Recent interstitial edema has cleared. Currently no edema or consolidation. No effusions.   Electronically Signed   By: Lowella Grip M.D.   On: 10/16/2014 14:05           MDM   Iv ns bolus. Dilaudid iv. zofran iv.  Pain improved.  Wbc elevated.   Pt again denies cough or sob.   ua pending.  Given worsening abd pain, tenderness on exam, elevated wbc and no other clear source infection, will get ct abd.   Cultures have been sent.  Repeat bp lower/soft. ua still pending.    Given low bp, elevated wbc, will rx empirically w abx pending ua and ct result.   Cath ua w many bact and few wbc, will culture.   Ct pending.  Discussed with [facility], Dr Eliseo Squires  - she will follow up on ct when done, requests temp orders, stepdown.      Mirna Mires, MD 10/16/14 [phone]

## 2014-10-16 NOTE — ED Notes (Signed)
Pt with Hx of pancreatic cancer states she had a temperature of 104, pt did take percocet for pain. Pt currently does not have a fever when measured orally.

## 2014-10-17 ENCOUNTER — Encounter (HOSPITAL_COMMUNITY): Payer: Self-pay | Admitting: Internal Medicine

## 2014-10-17 DIAGNOSIS — R7881 Bacteremia: Secondary | ICD-10-CM

## 2014-10-17 DIAGNOSIS — B1920 Unspecified viral hepatitis C without hepatic coma: Secondary | ICD-10-CM | POA: Diagnosis present

## 2014-10-17 DIAGNOSIS — B9689 Other specified bacterial agents as the cause of diseases classified elsewhere: Secondary | ICD-10-CM

## 2014-10-17 DIAGNOSIS — D630 Anemia in neoplastic disease: Secondary | ICD-10-CM | POA: Diagnosis present

## 2014-10-17 DIAGNOSIS — C259 Malignant neoplasm of pancreas, unspecified: Secondary | ICD-10-CM

## 2014-10-17 DIAGNOSIS — E876 Hypokalemia: Secondary | ICD-10-CM

## 2014-10-17 LAB — CBC
HCT: 23.6 % — ABNORMAL LOW (ref 36.0–46.0)
HEMATOCRIT: 25.6 % — AB (ref 36.0–46.0)
Hemoglobin: 7.4 g/dL — ABNORMAL LOW (ref 12.0–15.0)
Hemoglobin: 8 g/dL — ABNORMAL LOW (ref 12.0–15.0)
MCH: 28.2 pg (ref 26.0–34.0)
MCH: 28.5 pg (ref 26.0–34.0)
MCHC: 31.4 g/dL (ref 30.0–36.0)
MCHC: 31.3 g/dL (ref 30.0–36.0)
MCV: 90.1 fL (ref 78.0–100.0)
MCV: 91.1 fL (ref 78.0–100.0)
Platelets: 219 10*3/uL (ref 150–400)
Platelets: 241 10*3/uL (ref 150–400)
RBC: 2.62 MIL/uL — ABNORMAL LOW (ref 3.87–5.11)
RBC: 2.81 MIL/uL — ABNORMAL LOW (ref 3.87–5.11)
RDW: 15.5 % (ref 11.5–15.5)
RDW: 15.5 % (ref 11.5–15.5)
WBC: 7.2 10*3/uL (ref 4.0–10.5)
WBC: 5.6 10*3/uL (ref 4.0–10.5)

## 2014-10-17 LAB — BASIC METABOLIC PANEL
Anion gap: 4 — ABNORMAL LOW (ref 5–15)
BUN: 9 mg/dL (ref 6–23)
CALCIUM: 7.6 mg/dL — AB (ref 8.4–10.5)
CO2: 20 mmol/L (ref 19–32)
CREATININE: 0.44 mg/dL — AB (ref 0.50–1.10)
Chloride: 110 mmol/L (ref 96–112)
GFR calc non Af Amer: >90 mL/min (ref >90)
GLUCOSE: 76 mg/dL (ref 70–99)
POTASSIUM: 3.1 mmol/L — AB (ref 3.5–5.1)
SODIUM: 134 mmol/L — AB (ref 135–145)

## 2014-10-17 LAB — TYPE AND SCREEN
ABO/RH(D): O POS
Antibody Screen: NEGATIVE

## 2014-10-17 MED ORDER — SIMETHICONE 80 MG PO CHEW
160.0000 mg | CHEWABLE_TABLET | Freq: Four times a day (QID) | ORAL | Status: DC | PRN
  Administered 2014-10-17: 160 mg via ORAL
  Filled 2014-10-17 (×2): qty 2

## 2014-10-17 MED ORDER — POTASSIUM CHLORIDE 10 MEQ/100ML IV SOLN: 10.0000 meq | INTRAVENOUS | Status: DC

## 2014-10-17 MED ORDER — POTASSIUM CHLORIDE 10 MEQ/100ML IV SOLN
INTRAVENOUS | Status: AC
  Filled 2014-10-17: qty 100

## 2014-10-17 MED ORDER — CETYLPYRIDINIUM CHLORIDE 0.05 % MT LIQD: 7.0000 mL | Freq: Two times a day (BID) | OROMUCOSAL | Status: DC

## 2014-10-17 MED ORDER — POTASSIUM CHLORIDE IN NACL 40-0.9 MEQ/L-% IV SOLN
INTRAVENOUS | Status: DC
  Administered 2014-10-17 – 2014-10-22 (×10): 75 mL/h via INTRAVENOUS
  Filled 2014-10-17 (×17): qty 1000

## 2014-10-17 MED ORDER — POTASSIUM CHLORIDE 10 MEQ/100ML IV SOLN
10.0000 meq | INTRAVENOUS | Status: AC
  Administered 2014-10-17 (×2): 10 meq via INTRAVENOUS
  Filled 2014-10-17: qty 100

## 2014-10-17 NOTE — Progress Notes (Signed)
Subjective: Unfortunate lady with pancreatic cancer who had a Whipple on 08/07/14.  She has had multiple complications post op including pneumonia, Portal vein thrombosis, , sepsis with Klebsiella bacteremia, GI bleed, thrombocytopenia, and requiring transfusion on multiple occasions.  She was just discharged on 10/09/14 and now returns with sepsis, hypotension, a drop in her H/H.  She looks good now.  HR 67 sinus, RR 18, Bp 106/71, 100 O2 sat.  She says her stomach hurts and this is chronic pain now.  No different from before.  Relieved by PO or IV pain medicine.  She is on a regular diet, but didn't eat a great deal.  We are ask to see.  Objective: Vital signs in last 24 hours: Temp:  [98 F (36.7 C)-99.3 F (37.4 C)] 98 F (36.7 C) (01/27 1200) Pulse Rate:  [57-83] 57 (01/27 1500) Resp:  [12-20] 13 (01/27 1500) BP: (93-124)/(49-79) 93/56 mmHg (01/27 1500) SpO2:  [100 %] 100 % (01/27 1500) Last BM Date: 10/16/14 Afebrile here in hospital. Last BP in the 90's 2 gram drop in H/H since admit labs yesterday.  WBC is normal NA 134, K+3.1 US abdomen:  thrombosis of portal vein unchanged, from CT.  Pancreatic stent now in SB, right colon thickening ?etiology. Intake/Output from previous day: 01/26 0701 - 01/27 0700 In: 783 [I.V.:83; IV Piggyback:700] Out: 275 [Urine:275] Intake/Output this shift: Total I/O In: 931.3 [P.O.:240; I.V.:641.3; IV Piggyback:50] Out: 175 [Urine:175]  General appearance: alert, cooperative and no distress Resp: clear to auscultation bilaterally Cardio: regular rate and rhythm, S1, S2 normal, no murmur, click, rub or gallop GI: soft, non-tender; bowel sounds normal; no masses,  no organomegaly and some chronic tenderness, but nothing acute.  Abdominal incision is well healed , no distension + Bs. Extremities: extremities normal, atraumatic, no cyanosis or edema  Lab Results:   Recent Labs  10/16/14 1339 10/17/14 0345  WBC 14.8* 7.2  HGB 9.6* 7.4*  HCT  30.1* 23.6*  PLT 233 219    BMET  Recent Labs  10/16/14 1339 10/17/14 0345  NA 129* 134*  K 3.7 3.1*  CL 103 110  CO2 18* 20  GLUCOSE 143* 76  BUN 10 9  CREATININE 0.60 0.44*  CALCIUM 8.0* 7.6*   PT/INR No results for input(s): LABPROT, INR in the last 72 hours.   Recent Labs Lab 10/16/14 1339  AST 35  ALT 24  ALKPHOS 455*  BILITOT 0.6  PROT 9.0*  ALBUMIN 2.1*     Lipase     Component Value Date/Time   LIPASE 14 10/16/2014 1339     Studies/Results: Dg Chest 2 View  10/16/2014   CLINICAL DATA:  Fever. History of pancreatic carcinoma. Hypertension.  EXAM: CHEST  2 VIEW  COMPARISON:  October 02, 2014  FINDINGS: There is been interval clearing of interstitial edema. There is some mild fibrotic change scattered throughout the lungs, stable. No edema or consolidation currently. Heart size and pulmonary vascularity are normal. No adenopathy. No bone lesions.  IMPRESSION: Areas of scattered interstitial fibrosis. Recent interstitial edema has cleared. Currently no edema or consolidation. No effusions.   Electronically Signed   By: Lowella Grip M.D.   On: 10/16/2014 14:05   Ct Abdomen Pelvis W Contrast  10/16/2014   CLINICAL DATA:  Fever, lower pelvic pain, history of pancreatic cancer status post Whipple  EXAM: CT ABDOMEN AND PELVIS WITH CONTRAST  TECHNIQUE: Multidetector CT imaging of the abdomen and pelvis was performed using the standard protocol following bolus administration  of intravenous contrast.  CONTRAST:  152mL OMNIPAQUE IOHEXOL 300 MG/ML  SOLN  COMPARISON:  09/26/2014  FINDINGS: Postsurgical changes related to Whipple procedure.  Lower chest:  Mild dependent atelectasis at the lung bases.  Hepatobiliary: Heterogeneous perfusion of the liver on the arterial phase, although this improves on the delayed phase.  Status post cholecystectomy. No intrahepatic or extrahepatic ductal dilatation.  Pancreas: Partial pancreatectomy with atrophy of the residual pancreatic  tail. Prior pancreatic stent is now located entirely within small bowel (series 2/image 26).  Spleen: Within normal limits.  Adrenals/Urinary Tract: Adrenal glands are unremarkable.  Kidneys are within normal limits.  No hydronephrosis.  Bladder is unremarkable.  Stomach/Bowel: Partial gastrectomy.  Patent gastrojejunostomy.  No evidence of bowel obstruction.  Right colon is mildly thick-walled (series 2/image 50), nonspecific, but raising the possibility of infectious/inflammatory colitis. Rectosigmoid colon is mildly thick-walled but underdistended.  Vascular/Lymphatic: Atherosclerotic calcifications of the abdominal aorta and branch vessels.  Thrombosis of the main portal vein (series 2/ image 25).  Soft tissue/nodularity along the hepatoduodenal ligament (series 2/ image 21), likely reflecting radiation/postsurgical changes, although a discrete 11 mm gastrohepatic node is possible.  Reproductive: Uterus and bilateral ovaries are grossly unremarkable.  Other: Small volume pelvic ascites.  Musculoskeletal: Degenerative changes of the visualized thoracolumbar spine, most prominent at L5-S1.  IMPRESSION: Postsurgical changes related to Whipple procedure, as above. Please note that the prior pancreatic stent has now migrated into the small bowel.  Portal vein thrombosis. Suspected associated heterogeneous perfusion of the liver.  Soft tissue/nodularity along the hepatoduodenal ligament is favored to reflect radiation/postsurgical changes, although a discrete 11 mm gastrohepatic node is not excluded.  Mild wall thickening of the right colon, possibly reflecting infectious/inflammatory colitis.   Electronically Signed   By: Julian Hy M.D.   On: 10/16/2014 18:27   US Abdomen Limited Ruq  10/16/2014   CLINICAL DATA:  Elevated alkaline phosphatase  EXAM: US ABDOMEN LIMITED - RIGHT UPPER QUADRANT  COMPARISON:  CT from earlier in the same day  FINDINGS: Gallbladder:  Surgically removed  Common bile duct:  Not  well visualized likely related to the recent Whipple procedure.  Liver:  Diffuse heterogeneity of the liver is noted consistent with that seen on the recent CT examination. The portal vein is again poorly visualized consistent with thrombosis as previously described. Hepatic artery is patent.  IMPRESSION: Postsurgical changes.  Thrombosis of the portal vein similar to that seen on recent CT examination.  Heterogeneous liver consistent with that seen on the recent CT examination likely related to the portal vein thrombosis.   Electronically Signed   By: Inez Catalina M.D.   On: 10/16/2014 21:43    Medications: . enoxaparin (LOVENOX) injection  40 mg Subcutaneous Q24H  . feeding supplement (ENSURE COMPLETE)  237 mL Oral TID BM  . fentaNYL  25 mcg Transdermal Q72H  . piperacillin-tazobactam (ZOSYN)  IV  3.375 g Intravenous Q8H  . pregabalin  25 mg Oral QHS  . sertraline  100 mg Oral q morning - 10a  . sodium chloride  3 mL Intravenous Q12H  . sucralfate  1 g Oral TID WC & HS  . vancomycin  750 mg Intravenous Q12H   . 0.9 % NaCl with KCl 40 mEq / L 75 mL/hr (10/17/14 0747)   Prior to Admission medications   Medication Sig Start Date End Date Taking? Authorizing Provider  CALCIUM-VITAMIN D PO Take 1 capsule by mouth daily.   Yes Historical Provider, MD  feeding supplement,  ENSURE COMPLETE, (ENSURE COMPLETE) LIQD Take 237 mLs by mouth 3 (three) times daily between meals. 10/09/14  Yes Robbie Lis, MD  ondansetron (ZOFRAN) 4 MG tablet Take 1 tablet (4 mg total) by mouth every 8 (eight) hours as needed for nausea or vomiting. 10/09/14  Yes Robbie Lis, MD  oxyCODONE-acetaminophen (PERCOCET/ROXICET) 5-325 MG per tablet Take 1-2 tablets by mouth every 4 (four) hours as needed for moderate pain. 10/09/14  Yes Robbie Lis, MD  pantoprazole (PROTONIX) 40 MG tablet Take 1 tablet (40 mg total) by mouth 2 (two) times daily. Patient taking differently: Take 40 mg by mouth 2 (two) times daily as needed  (heartburn).  10/09/14  Yes Robbie Lis, MD  potassium chloride SA (K-DUR,KLOR-CON) 20 MEQ tablet Take 1 tablet (20 mEq total) by mouth daily. 10/09/14  Yes Robbie Lis, MD  sertraline (ZOLOFT) 100 MG tablet Take 100 mg by mouth every morning.    Yes Historical Provider, MD  sucralfate (CARAFATE) 1 GM/10ML suspension Take 10 mLs (1 g total) by mouth 4 (four) times daily -  with meals and at bedtime. 10/09/14  Yes Robbie Lis, MD  antiseptic oral rinse (CPC / CETYLPYRIDINIUM CHLORIDE 0.05%) 0.05 % LIQD solution 7 mLs by Mouth Rinse route 2 (two) times daily. 10/09/14   Robbie Lis, MD  fentaNYL (DURAGESIC - DOSED MCG/HR) 25 MCG/HR patch Place 1 patch (25 mcg total) onto the skin every 3 (three) days. 10/09/14   Robbie Lis, MD  pregabalin (LYRICA) 25 MG capsule Take 1 capsule (25 mg total) by mouth at bedtime. 10/09/14   Robbie Lis, MD  prochlorperazine (COMPAZINE) 5 MG tablet TAKE ONE TO TWO TABS EVERY SIX HOURS PRN. 10/11/14   Ladell Pier, MD    Assessment/Plan 1.  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 Barry Dienes Post op RLL pneumonia/Severe PCM/Home 08/21/14. Readmitted 09/14/14 with GI bleed on NSAID's/admit Hemoglobin 5.8. Portal vein clot, proximal to the bifurcation; heparin started 09/16/14. Thrombocytopenia platelet count 21K 09/17/14 (hepain on hold)  Sepsis temp 104/hypotension/bacteremia with Klebsiella Rebleed on 09/26/14 with transfusion, anticoagulation held EGD 09/28/14, Dr. Fuller Plan: 1. Prior Whipple surgery with gastroenterostomy in the gastric Body. Fiable areas at the anastomosis The EGD otherwise appeared normal./ recurrent hematemesis on 10/01/14-  Carafate/PPI/Octreotide Home 10/11/14.   Plan:  Last cbc at  [place], another ordered for AM, I will recheck now.   Currently on Vancomycin.  No surgical recommendations at this point.  LOS: 1 day    Dennisha Mouser 10/17/2014

## 2014-10-17 NOTE — Progress Notes (Signed)
INITIAL NUTRITION ASSESSMENT  DOCUMENTATION CODES Per approved criteria  -Severe malnutrition in the context of chronic illness  Pt meets criteria for severe MALNUTRITION in the context of chronic illness as evidenced by moderate fat and muscle wasting.  INTERVENTION: - Ensure Complete po BID, each supplement provides 350 kcal and 13 grams of protein - Magic cup TID with meals, each supplement provides 290 kcal and 9 grams of protein - RD will continue to monitor  NUTRITION DIAGNOSIS: Inadequate oral intake related to chronic illness as evidenced by reported poor po intake.   Goal: Pt to meet >/= 90% of their estimated nutrition needs   Monitor:  Weight trend, po intake, acceptance of supplements, labs  Reason for Assessment: MST  64 y.o. female  Admitting Dx: Septic shock  ASSESSMENT: 64 y.o. female Who was d/c'd from the hospital with septic shock with Klebsiella pneu bacteremia on 1/19. Patient was seen by home health nurse today who said her temperature was 104.  - Pt seen by RD during previous admissions.  - She said that she ate well at breakfast and lunch today. Pt reports that she has felt hungry. She says that "she is starting to get her muscle back." Per chart history, weight has increased by 9-18 lbs in the past several months. Unsure if weight is correct?  - RD to order nutritional supplements.   Labs reviewed  Nutrition Focused Physical Exam:  Subcutaneous Fat:  Orbital Region: moderate wasting Upper Arm Region: moderate wasting Thoracic and Lumbar Region: n/a  Muscle:  Temple Region: moderate wasting Clavicle Bone Region: moderate wasting Clavicle and Acromion Bone Region: moderate wasting Scapular Bone Region: n/a Dorsal Hand: mild wasting Patellar Region: moderate wasting Anterior Thigh Region: moderate wasting Posterior Calf Region: moderate wasting  Edema: none  Height: Ht Readings from Last 1 Encounters:  10/01/14 5\' 3"  (1.6 m)     Weight: Wt Readings from Last 1 Encounters:  10/09/14 127 lb 3.3 oz (57.7 kg)    Ideal Body Weight: 63.9 kg  % Ideal Body Weight: 90%  Wt Readings from Last 10 Encounters:  10/09/14 127 lb 3.3 oz (57.7 kg)  09/10/14 109 lb 9.6 oz (49.714 kg)  08/07/14 118 lb 6.2 oz (53.7 kg)  08/02/14 121 lb 11.2 oz (55.203 kg)  07/30/14 121 lb (54.885 kg)  07/26/14 122 lb 1.6 oz (55.384 kg)  06/22/14 125 lb 4.8 oz (56.836 kg)  06/08/14 126 lb 11.2 oz (57.471 kg)  06/05/14 130 lb 1.6 oz (59.013 kg)  05/24/14 130 lb 1.6 oz (59.013 kg)    BMI:  There is no weight on file to calculate BMI.  Estimated Nutritional Needs: Kcal: 1500-1700 Protein: 75-95 g Fluid: 1.7 L/day  Skin: intact  Diet Order: Diet regular  EDUCATION NEEDS: -Education needs addressed   Intake/Output Summary (Last 24 hours) at 10/17/14 1338 Last data filed at 10/17/14 1100  Gross per 24 hour  Intake 1414.25 ml  Output    450 ml  Net 964.25 ml    Last BM: prior to admission   Labs:   Recent Labs Lab 10/16/14 1339 10/17/14 0345  NA 129* 134*  K 3.7 3.1*  CL 103 110  CO2 18* 20  BUN 10 9  CREATININE 0.60 0.44*  CALCIUM 8.0* 7.6*  GLUCOSE 143* 76    CBG (last 3)  No results for input(s): GLUCAP in the last 72 hours.  Scheduled Meds: . antiseptic oral rinse  7 mL Mouth Rinse BID  . enoxaparin (LOVENOX) injection  40 mg Subcutaneous Q24H  . feeding supplement (ENSURE COMPLETE)  237 mL Oral TID BM  . fentaNYL  25 mcg Transdermal Q72H  . piperacillin-tazobactam (ZOSYN)  IV  3.375 g Intravenous Q8H  . pregabalin  25 mg Oral QHS  . sertraline  100 mg Oral q morning - 10a  . sodium chloride  3 mL Intravenous Q12H  . sucralfate  1 g Oral TID WC & HS  . vancomycin  750 mg Intravenous Q12H    Continuous Infusions: . 0.9 % NaCl with KCl 40 mEq / L 75 mL/hr (10/17/14 0747)    Past Medical History  Diagnosis Date  . Arthritis   . Depression   . H/O: substance abuse   . Back pain   . Personal  history of colonic polyp - adenoma 10/26/2013    10/26/2013 diminutive sigmoid polyp removed    . Pancreatitis, acute   . Hypertension   . Osteoporosis   . Radiation 04/30/14-06/08/14    pancreas 50 gray  . Cancer     pancreatic cancer  . Hemorrhagic shock 09/14/2014  . Acute GI bleeding 09/14/2014  . Thrombocytopenia 09/16/2014  . 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 Acute respiratory failure with hypoxia   . Bacteremia due to Klebsiella pneumoniae   . Anemia due to acute blood loss 09/26/2014    Past Surgical History  Procedure Laterality Date  . Lumbar epidural injection    . Colonoscopy    . Ercp N/A 03/06/2014    Procedure: ENDOSCOPIC RETROGRADE CHOLANGIOPANCREATOGRAPHY (ERCP);  Surgeon: Gatha Mayer, MD;  Location: [facility] ENDOSCOPY;  Service: Endoscopy;  Laterality: N/A;  . Eus N/A 04/05/2014    Procedure: UPPER ENDOSCOPIC ULTRASOUND (EUS) RADIAL;  Surgeon: [name], MD;  Location: [facility] ENDOSCOPY;  Service: Endoscopy;  Laterality: N/A;  . Whipple procedure N/A 08/07/2014    Procedure:  DIAGNOSTIC LAPAROSCOPY, WHIPPLE PROCEDURE;  Surgeon: Stark Klein, MD;  Location: [facility] ORS;  Service: General;  Laterality: N/A;  . Esophagogastroduodenoscopy N/A 09/28/2014    Procedure: ESOPHAGOGASTRODUODENOSCOPY (EGD);  Surgeon: Ladene Artist, MD;  Location: [facility] ENDOSCOPY;  Service: Endoscopy;  Laterality: N/A;    Laurette Schimke [place], [place], [place]

## 2014-10-17 NOTE — Progress Notes (Addendum)
Progress Note   Melinda Conrad CBJ:[id number] DOB: 1950/12/03 DOA: 10/16/2014 PCP: Kevan Ny, MD   Brief Narrative:   Melinda Conrad is an 64 y.o. female with PMH of pancreatic cancer, status post radiation therapy, hypertension and history of substance abuse, recent prolonged admission (09/14/14-10/09/14) for Klebsiella sepsis/bacteremia, who was re-admitted 10/16/13 with high fever and worsening epigastric pain. On admission, lactic acid was normal. Blood pressure was low with systolics in the 16W-73X.  Assessment/Plan:   Principal Problem:   Severe sepsis / sepsis associated hypotension  Continue empiric broad-spectrum antibiotics with vancomycin/Zosyn.  Follow-up blood cultures and urine culture. Preliminary blood culture growing gram-positive cocci in pairs/chains and gram-negative rods. Will ask ID to consult.  Blood pressure stable this morning status post aggressive IV fluids.  CT of the abdomen concerning for infectious versus inflammatory colitis and migration of pancreatic stent. Will notify Dr. Barry Dienes of the patient's admission.  Active Problems:   Multifactorial anemia  Patient had recent GI bleeding. 2 g drop in hemoglobin noted over the past 24 hours, hopefully dilutional, but will monitor closely.    Portal vein thrombosis  Has not been on therapeutic anticoagulation secondary to recent significant GI bleeding.    Pancreatic cancer  Status post Whipple procedure.    Protein-calorie malnutrition, severe  Continue nutritional supplements.    Hypokalemia  Replace.    Hyponatremia  Improved with IV fluids.    Elevated alkaline phosphatase level  Has been elevated since 10/02/14.  Status post abdominal ultrasound which only shows known portal vein thrombosis.    DVT Prophylaxis  On Lovenox.  Code Status: Full. Family Communication: Husband, [name] updated at the bedside. Disposition Plan: Home when stable.   IV Access:    Peripheral  IV   Procedures and diagnostic studies:   Dg Chest 2 View 10/16/2014: Areas of scattered interstitial fibrosis. Recent interstitial edema has cleared. Currently no edema or consolidation. No effusions.    Ct Abdomen Pelvis W Contrast 10/16/2014: Postsurgical changes related to Whipple procedure, as above. Please note that the prior pancreatic stent has now migrated into the small bowel.  Portal vein thrombosis. Suspected associated heterogeneous perfusion of the liver.  Soft tissue/nodularity along the hepatoduodenal ligament is favored to reflect radiation/postsurgical changes, although a discrete 11 mm gastrohepatic node is not excluded.  Mild wall thickening of the right colon, possibly reflecting infectious/inflammatory colitis.     US Abdomen Limited Ruq 10/16/2014: Postsurgical changes.  Thrombosis of the portal vein similar to that seen on recent CT examination.  Heterogeneous liver consistent with that seen on the recent CT examination likely related to the portal vein thrombosis.     Medical Consultants:    Infectious Disease  Surgery  Anti-Infectives:    Vancomycin 10/16/14--->  Zosyn 10/16/14--->  Subjective:   Melinda Conrad tells me she is feeling a little bit better today, but that she had high fevers yesterday. She has some mild abdominal pain, no current nausea or vomiting.  Objective:    Filed Vitals:   10/17/14 1000 10/17/14 1100 10/17/14 1200 10/17/14 1300  BP: 123/62 112/64 124/61 99/54  Pulse: 60 83 67 72  Temp:   98 F (36.7 C)   TempSrc:   Oral   Resp: 14 18 16 13   SpO2: 100% 100% 100% 100%    Intake/Output Summary (Last 24 hours) at 10/17/14 1421 Last data filed at 10/17/14 1300  Gross per 24 hour  Intake 1564.25 ml  Output  450 ml  Net 1114.25 ml    Exam: Gen:  NAD Cardiovascular:  RRR, No M/R/G Respiratory:  Lungs CTAB Gastrointestinal:  Abdomen soft, NT/ND, + BS, abdominal surgical wound well-healed with no signs of  infection Extremities:  No C/E/C   Data Reviewed:    Labs: Basic Metabolic Panel:  Recent Labs Lab 10/16/14 1339 10/17/14 0345  NA 129* 134*  K 3.7 3.1*  CL 103 110  CO2 18* 20  GLUCOSE 143* 76  BUN 10 9  CREATININE 0.60 0.44*  CALCIUM 8.0* 7.6*   GFR Estimated Creatinine Clearance: 61.1 mL/min (by C-G formula based on Cr of 0.44). Liver Function Tests:  Recent Labs Lab 10/16/14 1339  AST 35  ALT 24  ALKPHOS 455*  BILITOT 0.6  PROT 9.0*  ALBUMIN 2.1*    Recent Labs Lab 10/16/14 1339  LIPASE 14   CBC:  Recent Labs Lab 10/16/14 1339 10/17/14 0345  WBC 14.8* 7.2  NEUTROABS 13.6*  --   HGB 9.6* 7.4*  HCT 30.1* 23.6*  MCV 90.7 90.1  PLT 233 219   Sepsis Labs:  Recent Labs Lab 10/16/14 1339 10/16/14 1340 10/17/14 0345  WBC 14.8*  --  7.2  LATICACIDVEN  --  2  --    Microbiology Recent Results (from the past 240 hour(s))  Blood culture (routine x 2)     Status: None (Preliminary result)   Collection Time: 10/16/14  3:41 PM  Result Value Ref Range Status   Specimen Description BLOOD RIGHT HAND  Final   Special Requests BOTTLES DRAWN AEROBIC AND ANAEROBIC 4 CC EA  Final   Culture   Final    GRAM NEGATIVE RODS GRAM POSITIVE COCCI IN PAIRS AND CHAINS Note: Gram Stain Report Called to,Read Back By and Verified With: CAROLYN RN ON 2W AT [id number] BY CASTC Performed at [facility]    Report Status PENDING  Incomplete  Blood culture (routine x 2)     Status: None (Preliminary result)   Collection Time: 10/16/14  4:22 PM  Result Value Ref Range Status   Specimen Description BLOOD RIGHT FOREARM  Final   Special Requests BOTTLES DRAWN AEROBIC AND ANAEROBIC 5ML  Final   Culture   Final    GRAM NEGATIVE RODS Note: Gram Stain Report Called to,Read Back By and Verified With: NICKY CARTER 10/17/14 1425 BY SMITHERSJ Performed at [facility]    Report Status PENDING  Incomplete  MRSA PCR Screening     Status: None   Collection  Time: 10/16/14  6:20 PM  Result Value Ref Range Status   MRSA by PCR NEGATIVE NEGATIVE Final    Comment:        The GeneXpert MRSA Assay (FDA approved for NASAL specimens only), is one component of a comprehensive MRSA colonization surveillance program. It is not intended to diagnose MRSA infection nor to guide or monitor treatment for MRSA infections.      Medications:   . enoxaparin (LOVENOX) injection  40 mg Subcutaneous Q24H  . feeding supplement (ENSURE COMPLETE)  237 mL Oral TID BM  . fentaNYL  25 mcg Transdermal Q72H  . piperacillin-tazobactam (ZOSYN)  IV  3.375 g Intravenous Q8H  . pregabalin  25 mg Oral QHS  . sertraline  100 mg Oral q morning - 10a  . sodium chloride  3 mL Intravenous Q12H  . sucralfate  1 g Oral TID WC & HS  . vancomycin  750 mg Intravenous Q12H   Continuous Infusions: .  0.9 % NaCl with KCl 40 mEq / L 75 mL/hr (10/17/14 0747)    Time spent: 35 minutes.  The patient is medically complex and requires high complexity decision making.    LOS: 1 day   Ayron Fillinger  [facility] Pager [phone]. If unable to reach me by pager, please call my cell phone at [phone].  *Please refer to [email or web address], password [email or web address] to get updated schedule on who will round on this patient, as hospitalists switch teams weekly. If 7PM-7AM, please contact night-coverage at [email or web address], password [email or web address] for any overnight needs.  10/17/2014, 2:21 PM

## 2014-10-17 NOTE — Progress Notes (Signed)
CRITICAL VALUE ALERT  Critical value received:  Positive blood cultures  Date of notification: 10/17/2014   Time of notification: 8118  Critical value read back:Yes.    Nurse who received alert:  Marcelyn Ditty   MD notified (1st page):  Dr. Rockne Menghini  Time of first page: 1328  MD notified (2nd page):  Time of second page:  Responding MD:  Dr. Rockne Menghini  Time MD responded:  1330 No new orders at this time. Sandre Kitty

## 2014-10-17 NOTE — Consult Note (Signed)
[facility]    Date of Admission:  10/16/2014           Day 2 vancomycin        Day 2 piperacillin tazobactam       Reason for Consult: Recurrent polymicrobial bacteremia following pancreaticoduodenectomy    Referring Physician: Dr. Gerald Stabs Rama  Principal Problem:   Recurrent bacteremia Active Problems:   Septic shock   Pancreatic cancer   Depression   Protein-calorie malnutrition, severe   Portal vein thrombosis   Anemia in neoplastic disease   Hepatitis C without hepatic coma   . enoxaparin (LOVENOX) injection  40 mg Subcutaneous Q24H  . feeding supplement (ENSURE COMPLETE)  237 mL Oral TID BM  . fentaNYL  25 mcg Transdermal Q72H  . piperacillin-tazobactam (ZOSYN)  IV  3.375 g Intravenous Q8H  . pregabalin  25 mg Oral QHS  . sertraline  100 mg Oral q morning - 10a  . sodium chloride  3 mL Intravenous Q12H  . sucralfate  1 g Oral TID WC & HS  . vancomycin  750 mg Intravenous Q12H    Recommendations: 1. Continue current antibiotics pending final blood culture results   Assessment: She has recurrent bacteremia with multiple different organisms. This is all likely to be from an intra-abdominal source related to her recent diagnosis of pancreatic cancer and pancreaticoduodenectomy. It is possible she has septic portal vein thrombosis. She is better with empiric anabiotic therapy. I will follow-up tomorrow.    HPI: Melinda Conrad is a 64 y.o. female who was diagnosed with pancreatic cancer last summer. She underwent chemoradiation followed by pancreaticoduodenectomy in November. Postoperatively she developed Enterobacter bacteremia on 08/10/2014. She was also treated for possible healthcare associated pneumonia. She was readmitted to the hospital on 09/14/2014 with sepsis and Klebsiella bacteremia. She also had an acute GI bleed. She was discharged on 10/09/2014. Yesterday morning she developed fever to 104, hypotension and increasing  epigastric pain leading to readmission. Abdominal CT scan showed postoperative changes and the previously recognized portal vein thrombosis. There was some new right colonic thickening and migration of her pancreatic stent into the small bowel. She was started on empiric antibiotics and fluids. She is feeling better today. Admission blood cultures are growing gram-negative rods and gram-positive cocci in pairs and chains.   Review of Systems: Constitutional: positive for anorexia, chills, fevers, malaise and weight loss, negative for sweats Eyes: negative Ears, nose, mouth, throat, and face: negative Respiratory: negative Cardiovascular: negative Gastrointestinal: positive for abdominal pain, negative for change in bowel habits, constipation, diarrhea, nausea and vomiting Genitourinary:negative  Past Medical History  Diagnosis Date  . Arthritis   . Depression   . H/O: substance abuse   . Back pain   . Personal history of colonic polyp - adenoma 10/26/2013    10/26/2013 diminutive sigmoid polyp removed    . Pancreatitis, acute   . Hypertension   . Osteoporosis   . Radiation 04/30/14-06/08/14    pancreas 50 gray  . Cancer     pancreatic cancer  . Hemorrhagic shock 09/14/2014  . Acute GI bleeding 09/14/2014  . Thrombocytopenia 09/16/2014  . 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 Acute respiratory failure with hypoxia   . Bacteremia due to Klebsiella pneumoniae   . Anemia due to acute blood loss 09/26/2014    History  Substance Use Topics  . Smoking status: Current Every Day Smoker -- 0.25 packs/day for 40 years  Types: Cigarettes  . Smokeless tobacco: Current User  . Alcohol Use: No     Comment: occasionally. Quit  , none in 3weeks"only drank before  to help with pain before ERD visit"    Family History  Problem Relation Age of Onset  . Colon cancer Neg Hx   . Esophageal cancer Neg Hx   . Rectal cancer Neg Hx   . Stomach cancer Neg Hx   . CAD Mother   . Cancer Father   .  Cirrhosis Sister     she is a heavy drinker.    No Known Allergies  OBJECTIVE: Blood pressure 78/56, pulse 57, temperature 98.2 F (36.8 C), temperature source Oral, resp. rate 12, SpO2 100 %. General: She is thin but alert in no distress Oral: No oropharyngeal lesions Skin: No rash Lungs: Clear Cor: Regular S1 and S2 with no murmur Abdomen: Healed midline incision. Mid abdominal pain with palpation.  Lab Results Lab Results  Component Value Date   WBC 7.2 10/17/2014   HGB 7.4* 10/17/2014   HCT 23.6* 10/17/2014   MCV 90.1 10/17/2014   PLT 219 10/17/2014    Lab Results  Component Value Date   CREATININE 0.44* 10/17/2014   BUN 9 10/17/2014   NA 134* 10/17/2014   K 3.1* 10/17/2014   CL 110 10/17/2014   CO2 20 10/17/2014    Lab Results  Component Value Date   ALT 24 10/16/2014   AST 35 10/16/2014   ALKPHOS 455* 10/16/2014   BILITOT 0.6 10/16/2014     Microbiology: Recent Results (from the past 240 hour(s))  Blood culture (routine x 2)     Status: None (Preliminary result)   Collection Time: 10/16/14  3:41 PM  Result Value Ref Range Status   Specimen Description BLOOD RIGHT HAND  Final   Special Requests BOTTLES DRAWN AEROBIC AND ANAEROBIC 4 CC EA  Final   Culture   Final    GRAM NEGATIVE RODS GRAM POSITIVE COCCI IN PAIRS AND CHAINS Note: Gram Stain Report Called to,Read Back By and Verified With: CAROLYN RN ON 2W AT [id number] BY CASTC Performed at [facility]    Report Status PENDING  Incomplete  Blood culture (routine x 2)     Status: None (Preliminary result)   Collection Time: 10/16/14  4:22 PM  Result Value Ref Range Status   Specimen Description BLOOD RIGHT FOREARM  Final   Special Requests BOTTLES DRAWN AEROBIC AND ANAEROBIC 5ML  Final   Culture   Final    GRAM NEGATIVE RODS Note: Gram Stain Report Called to,Read Back By and Verified With: NICKY CARTER 10/17/14 1425 BY SMITHERSJ Performed at [facility]    Report Status PENDING   Incomplete  MRSA PCR Screening     Status: None   Collection Time: 10/16/14  6:20 PM  Result Value Ref Range Status   MRSA by PCR NEGATIVE NEGATIVE Final    Comment:        The GeneXpert MRSA Assay (FDA approved for NASAL specimens only), is one component of a comprehensive MRSA colonization surveillance program. It is not intended to diagnose MRSA infection nor to guide or monitor treatment for MRSA infections.    CT ABDOMEN AND PELVIS WITH CONTRAST 10/16/2014  IMPRESSION: Postsurgical changes related to Whipple procedure, as above. Please note that the prior pancreatic stent has now migrated into the small bowel.  Portal vein thrombosis. Suspected associated heterogeneous perfusion of the liver.  Soft tissue/nodularity along the hepatoduodenal  ligament is favored to reflect radiation/postsurgical changes, although a discrete 11 mm gastrohepatic node is not excluded.  Mild wall thickening of the right colon, possibly reflecting infectious/inflammatory colitis.   Electronically Signed  By: Julian Hy M.D.  On: 10/16/2014 18:27   Michel Bickers, [facility] [phone] pager   [phone] cell 10/17/2014, 6:13 PM

## 2014-10-17 NOTE — Progress Notes (Signed)
[facility]  Patient Status: Active (receiving services up to time of hospitalization)  [facility] is providing the following services: RN  If patient discharges after hours, please call [phone].   Lurlean Leyden 10/17/2014, 3:39 PM

## 2014-10-17 NOTE — Progress Notes (Signed)
CRITICAL VALUE ALERT  Critical value received:  + blood cultures: gram negative rods and gram positive cocci in pairs and chains in anaerobic bottle  Date of notification: 10/17/2014  Time of notification:  0835  Critical value read back:Yes.    Nurse who received alert:  Idelle Crouch, RN  MD notified (1st page):  Dr. Rockne Menghini  Time of first page:  0900, notified Dr. Rockne Menghini at patient's bedside  MD notified (2nd page):  Time of second page:  Responding MD:  Dr. Rockne Menghini  Time MD responded:  0900

## 2014-10-17 NOTE — Progress Notes (Signed)
Patient has not urinated since her admission to the ICU; Bladder scan shows ~316mL urine in bladder. Patient denies need to urinate. Will continue to monitor.

## 2014-10-17 NOTE — Progress Notes (Signed)
Patient ID: Melinda Conrad, female   DOB: 08-May-1951, 64 y.o.   MRN: [id number]  Subjective: Pt alert and in no distress laying in bed. States her home nurse told her she had a temp of 104 degrees Farenheit and to come back to the hospital. Received CT scan today, concern for possible colitis. Doing well now, no n/v/d and pain controlled. Says her appetite has been improving and has more energy.  Objective: Vital signs in last 24 hours: Temp:  [98 F (36.7 C)-99.3 F (37.4 C)] 98.2 F (36.8 C) (01/27 1600) Pulse Rate:  [57-83] 57 (01/27 1500) Resp:  [12-20] 13 (01/27 1500) BP: (93-124)/(53-79) 93/56 mmHg (01/27 1500) SpO2:  [100 %] 100 % (01/27 1500) Last BM Date: 10/16/14  Intake/Output from previous day: 01/26 0701 - 01/27 0700 In: 783 [I.V.:83; IV Piggyback:700] Out: 275 [Urine:275] Intake/Output this shift: Total I/O In: 931.3 [P.O.:240; I.V.:641.3; IV Piggyback:50] Out: 175 [Urine:175]  General: Cachectic, NAD Resp: breathing comfortably GI: soft, nontender  Lab Results:   Recent Labs  10/16/14 1339 10/17/14 0345  WBC 14.8* 7.2  HGB 9.6* 7.4*  HCT 30.1* 23.6*  PLT 233 219   BMET  Recent Labs  10/16/14 1339 10/17/14 0345  NA 129* 134*  K 3.7 3.1*  CL 103 110  CO2 18* 20  GLUCOSE 143* 76  BUN 10 9  CREATININE 0.60 0.44*  CALCIUM 8.0* 7.6*   PT/INR No results for input(s): LABPROT, INR in the last 72 hours. ABG No results for input(s): PHART, HCO3 in the last 72 hours.  Invalid input(s): PCO2, PO2  Studies/Results: Dg Chest 2 View  10/16/2014   CLINICAL DATA:  Fever. History of pancreatic carcinoma. Hypertension.  EXAM: CHEST  2 VIEW  COMPARISON:  October 02, 2014  FINDINGS: There is been interval clearing of interstitial edema. There is some mild fibrotic change scattered throughout the lungs, stable. No edema or consolidation currently. Heart size and pulmonary vascularity are normal. No adenopathy. No bone lesions.  IMPRESSION: Areas of scattered  interstitial fibrosis. Recent interstitial edema has cleared. Currently no edema or consolidation. No effusions.   Electronically Signed   By: Lowella Grip M.D.   On: 10/16/2014 14:05   Ct Abdomen Pelvis W Contrast  10/16/2014   CLINICAL DATA:  Fever, lower pelvic pain, history of pancreatic cancer status post Whipple  EXAM: CT ABDOMEN AND PELVIS WITH CONTRAST  TECHNIQUE: Multidetector CT imaging of the abdomen and pelvis was performed using the standard protocol following bolus administration of intravenous contrast.  CONTRAST:  130mL OMNIPAQUE IOHEXOL 300 MG/ML  SOLN  COMPARISON:  09/26/2014  FINDINGS: Postsurgical changes related to Whipple procedure.  Lower chest:  Mild dependent atelectasis at the lung bases.  Hepatobiliary: Heterogeneous perfusion of the liver on the arterial phase, although this improves on the delayed phase.  Status post cholecystectomy. No intrahepatic or extrahepatic ductal dilatation.  Pancreas: Partial pancreatectomy with atrophy of the residual pancreatic tail. Prior pancreatic stent is now located entirely within small bowel (series 2/image 26).  Spleen: Within normal limits.  Adrenals/Urinary Tract: Adrenal glands are unremarkable.  Kidneys are within normal limits.  No hydronephrosis.  Bladder is unremarkable.  Stomach/Bowel: Partial gastrectomy.  Patent gastrojejunostomy.  No evidence of bowel obstruction.  Right colon is mildly thick-walled (series 2/image 50), nonspecific, but raising the possibility of infectious/inflammatory colitis. Rectosigmoid colon is mildly thick-walled but underdistended.  Vascular/Lymphatic: Atherosclerotic calcifications of the abdominal aorta and branch vessels.  Thrombosis of the main portal vein (series 2/ image  25).  Soft tissue/nodularity along the hepatoduodenal ligament (series 2/ image 21), likely reflecting radiation/postsurgical changes, although a discrete 11 mm gastrohepatic node is possible.  Reproductive: Uterus and bilateral  ovaries are grossly unremarkable.  Other: Small volume pelvic ascites.  Musculoskeletal: Degenerative changes of the visualized thoracolumbar spine, most prominent at L5-S1.  IMPRESSION: Postsurgical changes related to Whipple procedure, as above. Please note that the prior pancreatic stent has now migrated into the small bowel.  Portal vein thrombosis. Suspected associated heterogeneous perfusion of the liver.  Soft tissue/nodularity along the hepatoduodenal ligament is favored to reflect radiation/postsurgical changes, although a discrete 11 mm gastrohepatic node is not excluded.  Mild wall thickening of the right colon, possibly reflecting infectious/inflammatory colitis.   Electronically Signed   By: Julian Hy M.D.   On: 10/16/2014 18:27   US Abdomen Limited Ruq  10/16/2014   CLINICAL DATA:  Elevated alkaline phosphatase  EXAM: US ABDOMEN LIMITED - RIGHT UPPER QUADRANT  COMPARISON:  CT from earlier in the same day  FINDINGS: Gallbladder:  Surgically removed  Common bile duct:  Not well visualized likely related to the recent Whipple procedure.  Liver:  Diffuse heterogeneity of the liver is noted consistent with that seen on the recent CT examination. The portal vein is again poorly visualized consistent with thrombosis as previously described. Hepatic artery is patent.  IMPRESSION: Postsurgical changes.  Thrombosis of the portal vein similar to that seen on recent CT examination.  Heterogeneous liver consistent with that seen on the recent CT examination likely related to the portal vein thrombosis.   Electronically Signed   By: Inez Catalina M.D.   On: 10/16/2014 21:43    Anti-infectives: Anti-infectives    Start     Dose/Rate Route Frequency Ordered Stop   10/17/14 0600  vancomycin (VANCOCIN) IVPB 750 mg/150 ml premix     750 mg150 mL/hr over 60 Minutes Intravenous Every 12 hours 10/16/14 1853     10/17/14 0100  piperacillin-tazobactam (ZOSYN) IVPB 3.375 g     3.375 g12.5 mL/hr over 240  Minutes Intravenous Every 8 hours 10/16/14 1853     10/16/14 1630  piperacillin-tazobactam (ZOSYN) IVPB 3.375 g     3.375 g100 mL/hr over 30 Minutes Intravenous  Once 10/16/14 1624 10/16/14 1723   10/16/14 1630  vancomycin (VANCOCIN) IVPB 1000 mg/200 mL premix     1,000 mg200 mL/hr over 60 Minutes Intravenous  Once 10/16/14 1624 10/16/14 1745      Assessment/Plan: S/p whipple 08/20/14 Readmission for fever. ? Colitis Pt seems clinically to be improving in terms of energy, appetite, and pain level. Unclear source of fevers, perhaps related to portal vein thrombosis or translocation.     LOS: 1 day    Chester Holstein 10/17/2014

## 2014-10-18 ENCOUNTER — Other Ambulatory Visit: Payer: Medicaid Other

## 2014-10-18 DIAGNOSIS — F329 Major depressive disorder, single episode, unspecified: Secondary | ICD-10-CM

## 2014-10-18 DIAGNOSIS — D638 Anemia in other chronic diseases classified elsewhere: Secondary | ICD-10-CM

## 2014-10-18 DIAGNOSIS — B1920 Unspecified viral hepatitis C without hepatic coma: Secondary | ICD-10-CM

## 2014-10-18 DIAGNOSIS — I81 Portal vein thrombosis: Secondary | ICD-10-CM

## 2014-10-18 DIAGNOSIS — R6510 Systemic inflammatory response syndrome (SIRS) of non-infectious origin without acute organ dysfunction: Secondary | ICD-10-CM

## 2014-10-18 DIAGNOSIS — D630 Anemia in neoplastic disease: Secondary | ICD-10-CM

## 2014-10-18 DIAGNOSIS — E43 Unspecified severe protein-calorie malnutrition: Secondary | ICD-10-CM

## 2014-10-18 LAB — COMPREHENSIVE METABOLIC PANEL
ALT: 15 U/L (ref 0–35)
ANION GAP: 3 — AB (ref 5–15)
AST: 18 U/L (ref 0–37)
Albumin: 1.4 g/dL — ABNORMAL LOW (ref 3.5–5.2)
Alkaline Phosphatase: 275 U/L — ABNORMAL HIGH (ref 39–117)
BUN: 8 mg/dL (ref 6–23)
CHLORIDE: 112 mmol/L (ref 96–112)
CO2: 21 mmol/L (ref 19–32)
Calcium: 7.8 mg/dL — ABNORMAL LOW (ref 8.4–10.5)
Creatinine, Ser: 0.45 mg/dL — ABNORMAL LOW (ref 0.50–1.10)
Glucose, Bld: 66 mg/dL — ABNORMAL LOW (ref 70–99)
POTASSIUM: 3.9 mmol/L (ref 3.5–5.1)
Sodium: 136 mmol/L (ref 135–145)
TOTAL PROTEIN: 6.8 g/dL (ref 6.0–8.3)
Total Bilirubin: 0.5 mg/dL (ref 0.3–1.2)

## 2014-10-18 LAB — CBC
HEMATOCRIT: 23.8 % — AB (ref 36.0–46.0)
HEMOGLOBIN: 7.5 g/dL — AB (ref 12.0–15.0)
MCH: 28.4 pg (ref 26.0–34.0)
MCHC: 31.5 g/dL (ref 30.0–36.0)
MCV: 90.2 fL (ref 78.0–100.0)
Platelets: 249 10*3/uL (ref 150–400)
RBC: 2.64 MIL/uL — ABNORMAL LOW (ref 3.87–5.11)
RDW: 15.6 % — AB (ref 11.5–15.5)
WBC: 4.6 10*3/uL (ref 4.0–10.5)

## 2014-10-18 LAB — URINE CULTURE
CULTURE: NO GROWTH
Colony Count: NO GROWTH

## 2014-10-18 MED ORDER — ALUM & MAG HYDROXIDE-SIMETH 200-200-20 MG/5ML PO SUSP
30.0000 mL | ORAL | Status: DC | PRN
  Administered 2014-10-18 – 2014-10-19 (×3): 30 mL via ORAL
  Filled 2014-10-18 (×3): qty 30

## 2014-10-18 NOTE — Progress Notes (Signed)
Patient ID: Melinda Conrad, female   DOB: 1950/12/25, 64 y.o.   MRN: [id number]  Subjective: Pt awake in bed with boyfriend at the bedside. Pain is controlled, no n/v. Reports no fevers since seeing her yesterday.  Objective: Vital signs in last 24 hours: Temp:  [98 F (36.7 C)-98.5 F (36.9 C)] 98.1 F (36.7 C) (01/28 0800) Pulse Rate:  [51-83] 51 (01/28 0600) Resp:  [10-23] 11 (01/28 0600) BP: (78-124)/(54-74) 96/61 mmHg (01/28 0600) SpO2:  [100 %] 100 % (01/28 0600) Weight:  [100 lb 8.5 oz (45.6 kg)] 100 lb 8.5 oz (45.6 kg) (01/27 1900) Last BM Date: 10/16/14  Intake/Output from previous day: 01/27 0701 - 01/28 0700 In: 2496.3 [P.O.:280; I.V.:1766.3; IV Piggyback:450] Out: 350 [Urine:350] Intake/Output this shift: Total I/O In: -  Out: 75 [Urine:75]  General: Cachectic, NAD Resp: breathing comfortably GI: soft, nontender  Lab Results:   Recent Labs  10/17/14 1916 10/18/14 0410  WBC 5.6 4.6  HGB 8.0* 7.5*  HCT 25.6* 23.8*  PLT 241 249   BMET  Recent Labs  10/17/14 0345 10/18/14 0410  NA 134* 136  K 3.1* 3.9  CL 110 112  CO2 20 21  GLUCOSE 76 66*  BUN 9 8  CREATININE 0.44* 0.45*  CALCIUM 7.6* 7.8*   PT/INR No results for input(s): LABPROT, INR in the last 72 hours. ABG No results for input(s): PHART, HCO3 in the last 72 hours.  Invalid input(s): PCO2, PO2  Studies/Results: Dg Chest 2 View  10/16/2014   CLINICAL DATA:  Fever. History of pancreatic carcinoma. Hypertension.  EXAM: CHEST  2 VIEW  COMPARISON:  October 02, 2014  FINDINGS: There is been interval clearing of interstitial edema. There is some mild fibrotic change scattered throughout the lungs, stable. No edema or consolidation currently. Heart size and pulmonary vascularity are normal. No adenopathy. No bone lesions.  IMPRESSION: Areas of scattered interstitial fibrosis. Recent interstitial edema has cleared. Currently no edema or consolidation. No effusions.   Electronically Signed   By:  Lowella Grip M.D.   On: 10/16/2014 14:05   Ct Abdomen Pelvis W Contrast  10/16/2014   CLINICAL DATA:  Fever, lower pelvic pain, history of pancreatic cancer status post Whipple  EXAM: CT ABDOMEN AND PELVIS WITH CONTRAST  TECHNIQUE: Multidetector CT imaging of the abdomen and pelvis was performed using the standard protocol following bolus administration of intravenous contrast.  CONTRAST:  124mL OMNIPAQUE IOHEXOL 300 MG/ML  SOLN  COMPARISON:  09/26/2014  FINDINGS: Postsurgical changes related to Whipple procedure.  Lower chest:  Mild dependent atelectasis at the lung bases.  Hepatobiliary: Heterogeneous perfusion of the liver on the arterial phase, although this improves on the delayed phase.  Status post cholecystectomy. No intrahepatic or extrahepatic ductal dilatation.  Pancreas: Partial pancreatectomy with atrophy of the residual pancreatic tail. Prior pancreatic stent is now located entirely within small bowel (series 2/image 26).  Spleen: Within normal limits.  Adrenals/Urinary Tract: Adrenal glands are unremarkable.  Kidneys are within normal limits.  No hydronephrosis.  Bladder is unremarkable.  Stomach/Bowel: Partial gastrectomy.  Patent gastrojejunostomy.  No evidence of bowel obstruction.  Right colon is mildly thick-walled (series 2/image 50), nonspecific, but raising the possibility of infectious/inflammatory colitis. Rectosigmoid colon is mildly thick-walled but underdistended.  Vascular/Lymphatic: Atherosclerotic calcifications of the abdominal aorta and branch vessels.  Thrombosis of the main portal vein (series 2/ image 25).  Soft tissue/nodularity along the hepatoduodenal ligament (series 2/ image 21), likely reflecting radiation/postsurgical changes, although a discrete 11 mm  gastrohepatic node is possible.  Reproductive: Uterus and bilateral ovaries are grossly unremarkable.  Other: Small volume pelvic ascites.  Musculoskeletal: Degenerative changes of the visualized thoracolumbar spine,  most prominent at L5-S1.  IMPRESSION: Postsurgical changes related to Whipple procedure, as above. Please note that the prior pancreatic stent has now migrated into the small bowel.  Portal vein thrombosis. Suspected associated heterogeneous perfusion of the liver.  Soft tissue/nodularity along the hepatoduodenal ligament is favored to reflect radiation/postsurgical changes, although a discrete 11 mm gastrohepatic node is not excluded.  Mild wall thickening of the right colon, possibly reflecting infectious/inflammatory colitis.   Electronically Signed   By: Julian Hy M.D.   On: 10/16/2014 18:27   US Abdomen Limited Ruq  10/16/2014   CLINICAL DATA:  Elevated alkaline phosphatase  EXAM: US ABDOMEN LIMITED - RIGHT UPPER QUADRANT  COMPARISON:  CT from earlier in the same day  FINDINGS: Gallbladder:  Surgically removed  Common bile duct:  Not well visualized likely related to the recent Whipple procedure.  Liver:  Diffuse heterogeneity of the liver is noted consistent with that seen on the recent CT examination. The portal vein is again poorly visualized consistent with thrombosis as previously described. Hepatic artery is patent.  IMPRESSION: Postsurgical changes.  Thrombosis of the portal vein similar to that seen on recent CT examination.  Heterogeneous liver consistent with that seen on the recent CT examination likely related to the portal vein thrombosis.   Electronically Signed   By: Inez Catalina M.D.   On: 10/16/2014 21:43    Anti-infectives: Anti-infectives    Start     Dose/Rate Route Frequency Ordered Stop   10/17/14 0600  vancomycin (VANCOCIN) IVPB 750 mg/150 ml premix     750 mg150 mL/hr over 60 Minutes Intravenous Every 12 hours 10/16/14 1853     10/17/14 0100  piperacillin-tazobactam (ZOSYN) IVPB 3.375 g     3.375 g12.5 mL/hr over 240 Minutes Intravenous Every 8 hours 10/16/14 1853     10/16/14 1630  piperacillin-tazobactam (ZOSYN) IVPB 3.375 g     3.375 g100 mL/hr over 30 Minutes  Intravenous  Once 10/16/14 1624 10/16/14 1723   10/16/14 1630  vancomycin (VANCOCIN) IVPB 1000 mg/200 mL premix     1,000 mg200 mL/hr over 60 Minutes Intravenous  Once 10/16/14 1624 10/16/14 1745      Assessment/Plan: S/p whipple 08/20/14 Readmission for fever. ? Colitis Waiting on blood culture results for source of fever.    LOS: 2 days    Chester Holstein 10/18/2014

## 2014-10-18 NOTE — Progress Notes (Signed)
Patient ID: Melinda Conrad, female   DOB: [birth year], 64 y.o.   MRN: [id number]         [facility]    Date of Admission:  10/16/2014           Day 3 vancomycin        Day 3 piperacillin tazobactam  Principal Problem:   Recurrent bacteremia Active Problems:   Septic shock   Pancreatic cancer   Depression   Protein-calorie malnutrition, severe   Portal vein thrombosis   Anemia in neoplastic disease   Hepatitis C without hepatic coma   . enoxaparin (LOVENOX) injection  40 mg Subcutaneous Q24H  . feeding supplement (ENSURE COMPLETE)  237 mL Oral TID BM  . fentaNYL  25 mcg Transdermal Q72H  . piperacillin-tazobactam (ZOSYN)  IV  3.375 g Intravenous Q8H  . pregabalin  25 mg Oral QHS  . sertraline  100 mg Oral q morning - 10a  . sodium chloride  3 mL Intravenous Q12H  . sucralfate  1 g Oral TID WC & HS  . vancomycin  750 mg Intravenous Q12H    Subjective: She is feeling better but she has had some "acid indigestion" since yesterday. It got better with some Maalox this morning  Review of Systems: Pertinent items are noted in HPI.  Past Medical History  Diagnosis Date  . Arthritis   . Depression   . H/O: substance abuse   . Back pain   . Personal history of colonic polyp - adenoma 10/26/2013    10/26/2013 diminutive sigmoid polyp removed    . Pancreatitis, acute   . Hypertension   . Osteoporosis   . Radiation 04/30/14-06/08/14    pancreas 50 gray  . Cancer     pancreatic cancer  . Hemorrhagic shock 09/14/2014  . Acute GI bleeding 09/14/2014  . Thrombocytopenia 09/16/2014  . 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 Acute respiratory failure with hypoxia   . Bacteremia due to Klebsiella pneumoniae   . Anemia due to acute blood loss 09/26/2014    History  Substance Use Topics  . Smoking status: Current Every Day Smoker -- 0.25 packs/day for 40 years    Types: Cigarettes  . Smokeless tobacco: Current User  . Alcohol Use: No     Comment:  occasionally. Quit  , none in 3weeks"only drank before  to help with pain before ERD visit"    Family History  Problem Relation Age of Onset  . Colon cancer Neg Hx   . Esophageal cancer Neg Hx   . Rectal cancer Neg Hx   . Stomach cancer Neg Hx   . CAD Mother   . Cancer Father   . Cirrhosis Sister     she is a heavy drinker.    No Known Allergies  OBJECTIVE: Blood pressure 145/70, pulse 66, temperature 98.6 F (37 C), temperature source Oral, resp. rate 15, height 5\' 3"  (1.6 m), weight 100 lb 8.5 oz (45.6 kg), SpO2 100 %.   General: Alert and thin. She appears comfortable Lungs: Clear Cor: Regular S1 and S2 with no murmur Abdomen: Soft with mild epigastric tenderness. Her midline surgical incision has healed.  Lab Results Lab Results  Component Value Date   WBC 4.6 10/18/2014   HGB 7.5* 10/18/2014   HCT 23.8* 10/18/2014   MCV 90.2 10/18/2014   PLT 249 10/18/2014    Lab Results  Component Value Date   CREATININE 0.45* 10/18/2014   BUN 8 10/18/2014  NA 136 10/18/2014   K 3.9 10/18/2014   CL 112 10/18/2014   CO2 21 10/18/2014    Lab Results  Component Value Date   ALT 15 10/18/2014   AST 18 10/18/2014   ALKPHOS 275* 10/18/2014   BILITOT 0.5 10/18/2014     Microbiology: Recent Results (from the past 240 hour(s))  Urine culture     Status: None   Collection Time: 10/16/14  3:35 PM  Result Value Ref Range Status   Specimen Description URINE, RANDOM  Final   Special Requests NONE  Final   Colony Count NO GROWTH Performed at [facility]   Final   Culture NO GROWTH Performed at [facility]   Final   Report Status 10/18/2014 FINAL  Final  Blood culture (routine x 2)     Status: None (Preliminary result)   Collection Time: 10/16/14  3:41 PM  Result Value Ref Range Status   Specimen Description BLOOD RIGHT HAND  Final   Special Requests BOTTLES DRAWN AEROBIC AND ANAEROBIC 4 CC EA  Final   Culture   Final    GRAM NEGATIVE RODS GRAM POSITIVE  COCCI IN PAIRS AND CHAINS Note: Gram Stain Report Called to,Read Back By and Verified With: CAROLYN RN ON 2W AT [id number] BY CASTC Performed at [facility]    Report Status PENDING  Incomplete  Blood culture (routine x 2)     Status: None (Preliminary result)   Collection Time: 10/16/14  4:22 PM  Result Value Ref Range Status   Specimen Description BLOOD RIGHT FOREARM  Final   Special Requests BOTTLES DRAWN AEROBIC AND ANAEROBIC 5ML  Final   Culture   Final    GRAM NEGATIVE RODS Note: Gram Stain Report Called to,Read Back By and Verified With: NICKY CARTER 10/17/14 1425 BY SMITHERSJ GRAM POSITIVE COCCI IN CLUSTERS Note: Gram Stain Report Called to,Read Back By and Verified With: Jackey Loge B Performed at [facility]    Report Status PENDING  Incomplete  MRSA PCR Screening     Status: None   Collection Time: 10/16/14  6:20 PM  Result Value Ref Range Status   MRSA by PCR NEGATIVE NEGATIVE Final    Comment:        The GeneXpert MRSA Assay (FDA approved for NASAL specimens only), is one component of a comprehensive MRSA colonization surveillance program. It is not intended to diagnose MRSA infection nor to guide or monitor treatment for MRSA infections.     Assessment: She has recurrent, polymicrobial bacteremia with gram-negative rods, gram positive cocci in pairs and gram-positive cocci in clusters. I strongly suspect that this is coming from an intra-abdominal source related to her recent pancreaticoduodenectomy.  Plan: 1. Continue current antibiotics pending final blood culture results  Michel Bickers, MD [facility] for [place] [phone] pager   [phone] cell 10/18/2014, 5:12 PM

## 2014-10-18 NOTE — Progress Notes (Signed)
ANTIBIOTIC CONSULT NOTE - FOLLOW UP  Pharmacy Consult for Vancomycin and Zosyn Indication: Sepsis  No Known Allergies  Patient Measurements: Height: 5\' 3"  (160 cm) Weight: 100 lb 8.5 oz (45.6 kg) IBW/kg (Calculated) : 52.4  Vital Signs: Temp: 98 F (36.7 C) (01/28 1135) Temp Source: Oral (01/28 1135) BP: 112/68 mmHg (01/28 0857) Pulse Rate: 68 (01/28 0857) Intake/Output from previous day: 01/27 0701 - 01/28 0700 In: 2571.3 [P.O.:280; I.V.:1841.3; IV Piggyback:450] Out: 350 [Urine:350] Intake/Output from this shift: Total I/O In: 150 [I.V.:150] Out: 325 [Urine:325]  Labs:  Recent Labs  10/16/14 1339 10/17/14 0345 10/17/14 1916 10/18/14 0410  WBC 14.8* 7.2 5.6 4.6  HGB 9.6* 7.4* 8.0* 7.5*  PLT 233 219 241 249  CREATININE 0.60 0.44*  --  0.45*   Estimated Creatinine Clearance: 53.2 mL/min (by C-G formula based on Cr of 0.45). No results for input(s): VANCOTROUGH, VANCOPEAK, VANCORANDOM, GENTTROUGH, GENTPEAK, GENTRANDOM, TOBRATROUGH, TOBRAPEAK, TOBRARND, AMIKACINPEAK, AMIKACINTROU, AMIKACIN in the last 72 hours.   Microbiology: Recent Results (from the past 720 hour(s))  Culture, blood (routine x 2)     Status: None   Collection Time: 09/19/14  2:50 PM  Result Value Ref Range Status   Specimen Description BLOOD RIGHT ARM  Final   Special Requests BOTTLES DRAWN AEROBIC ONLY [place]  Final   Culture   Final    NO GROWTH 5 DAYS Performed at [facility]    Report Status 09/25/2014 FINAL  Final  Culture, blood (routine x 2)     Status: None   Collection Time: 09/19/14  3:05 PM  Result Value Ref Range Status   Specimen Description BLOOD RIGHT ARM  Final   Special Requests BOTTLES DRAWN AEROBIC ONLY 4CC  Final   Culture   Final    NO GROWTH 5 DAYS Performed at [facility]    Report Status 09/25/2014 FINAL  Final  Urine culture     Status: None   Collection Time: 09/20/14 10:45 AM  Result Value Ref Range Status   Specimen Description URINE,  CATHETERIZED  Final   Special Requests NONE  Final   Colony Count NO GROWTH Performed at [facility]   Final   Culture NO GROWTH Performed at [facility]   Final   Report Status 09/23/2014 FINAL  Final  Culture, blood (routine x 2)     Status: None   Collection Time: 09/27/14 12:40 PM  Result Value Ref Range Status   Specimen Description BLOOD RIGHT HAND  Final   Special Requests BOTTLES DRAWN AEROBIC ONLY [place]  Final   Culture   Final    NO GROWTH 5 DAYS Performed at [facility]    Report Status 10/03/2014 FINAL  Final  Culture, blood (routine x 2)     Status: None   Collection Time: 09/27/14 12:45 PM  Result Value Ref Range Status   Specimen Description BLOOD EFT ARM  Final   Special Requests BOTTLES DRAWN AEROBIC AND ANAEROBIC 3CC  Final   Culture   Final    NO GROWTH 5 DAYS Performed at [facility]    Report Status 10/03/2014 FINAL  Final  Urine culture     Status: None   Collection Time: 10/16/14  3:35 PM  Result Value Ref Range Status   Specimen Description URINE, RANDOM  Final   Special Requests NONE  Final   Colony Count NO GROWTH Performed at [facility]   Final   Culture NO GROWTH  Performed at [facility]   Final   Report Status 10/18/2014 FINAL  Final  Blood culture (routine x 2)     Status: None (Preliminary result)   Collection Time: 10/16/14  3:41 PM  Result Value Ref Range Status   Specimen Description BLOOD RIGHT HAND  Final   Special Requests BOTTLES DRAWN AEROBIC AND ANAEROBIC 4 CC EA  Final   Culture   Final    GRAM NEGATIVE RODS GRAM POSITIVE COCCI IN PAIRS AND CHAINS Note: Gram Stain Report Called to,Read Back By and Verified With: CAROLYN RN ON 2W AT [id number] BY CASTC Performed at [facility]    Report Status PENDING  Incomplete  Blood culture (routine x 2)     Status: None (Preliminary result)   Collection Time: 10/16/14  4:22 PM  Result Value Ref Range Status   Specimen  Description BLOOD RIGHT FOREARM  Final   Special Requests BOTTLES DRAWN AEROBIC AND ANAEROBIC 5ML  Final   Culture   Final    GRAM NEGATIVE RODS Note: Gram Stain Report Called to,Read Back By and Verified With: NICKY CARTER 10/17/14 1425 BY SMITHERSJ GRAM POSITIVE COCCI IN CLUSTERS Note: Gram Stain Report Called to,Read Back By and Verified With: Jackey Loge B Performed at [facility]    Report Status PENDING  Incomplete  MRSA PCR Screening     Status: None   Collection Time: 10/16/14  6:20 PM  Result Value Ref Range Status   MRSA by PCR NEGATIVE NEGATIVE Final    Comment:        The GeneXpert MRSA Assay (FDA approved for NASAL specimens only), is one component of a comprehensive MRSA colonization surveillance program. It is not intended to diagnose MRSA infection nor to guide or monitor treatment for MRSA infections.     Assessment: 64 yr female with pancreatic cancer and was to begin chemo on 1/21 (chemo not given). Recent discharge from hospital on 1/19 where diagnosed with septic shock and Klebsiella pneumonia bacteremia. At home temp = 104F. CXR negative for PNA.  1/26 >>Vancomycin >> 1/26 >>Zosyn >>   Tmax: Afebrile x 24hr WBCs: improved to WNL Renal: SCr low/stable, CrCl 61 ml/min CG, 83 ml/min Normalized(using 0.8)  1/26 blood: 2 of 2 GNR, 1 of 2 GNR and GPC pairs & chains 1/26 urine: NGF (UA many bacteria)  Drug level / dose changes info: 1/29 VT at 0500 = ____ prior to 5th dose of 750mg  q12h  Goal of Therapy:  Vancomycin trough level 15-20 mcg/ml  Zosyn dose per renal function  Plan:  Day #3 Vancomycin and Zosyn  Continue vancomycin 750mg  IV q12h  Check trough tomorrow AM before 6th total dose Follow up renal function & cultures, de-escalation pending final cultures  Peggyann Juba, PharmD, BCPS Pager: [phone] 10/18/2014,11:39 AM

## 2014-10-18 NOTE — Progress Notes (Signed)
Progress Note   Melinda Conrad VZC:[id number] DOB: 04-Nov-1950 DOA: 10/16/2014 PCP: Kevan Ny, MD   Brief Narrative:   Melinda Conrad is an 64 y.o. female with PMH of pancreatic cancer, status post radiation therapy, hypertension and history of substance abuse, recent prolonged admission (09/14/14-10/09/14) for Klebsiella sepsis/bacteremia, who was re-admitted 10/16/13 with high fever and worsening epigastric pain. On admission, lactic acid was normal. Blood pressure was low with systolics in the 12I-78M.  Assessment/Plan:   Principal Problem:   Severe sepsis / sepsis associated hypotension  Continue empiric broad-spectrum antibiotics with vancomycin/Zosyn.  Follow-up blood cultures and urine culture. Preliminary blood culture growing gram-positive cocci in pairs/chains and gram-negative rods. Seen by Dr. Megan Salon in consultation with recommendations to continue current antibiotics until culture data returned.  Blood pressure remains stable status post aggressive IV fluids.  CT of the abdomen concerning for infectious versus inflammatory colitis and migration of pancreatic stent. Dr. Barry Dienes of the patient's admission.  Transfer to floor, hemodynamically stable.  Active Problems:   Multifactorial anemia  Patient had recent GI bleeding. 2 g drop in hemoglobin noted over the past 24 hours, hopefully dilutional, but will monitor closely.  Has stabilized at 7.5-8.    Portal vein thrombosis  Has not been on therapeutic anticoagulation secondary to recent significant GI bleeding.    Pancreatic cancer  Status post Whipple procedure.    Protein-calorie malnutrition, severe  Continue nutritional supplements.    Hypokalemia  Replaced.    Hyponatremia  Improved with IV fluids.    Elevated alkaline phosphatase level  Has been elevated since 10/02/14.  Status post abdominal ultrasound which only shows known portal vein thrombosis.    DVT Prophylaxis  On  Lovenox.  Code Status: Full. Family Communication: Husband, [name] updated at the bedside. Disposition Plan: Home when stable.   IV Access:    Peripheral IV   Procedures and diagnostic studies:   Dg Chest 2 View 10/16/2014: Areas of scattered interstitial fibrosis. Recent interstitial edema has cleared. Currently no edema or consolidation. No effusions.    Ct Abdomen Pelvis W Contrast 10/16/2014: Postsurgical changes related to Whipple procedure, as above. Please note that the prior pancreatic stent has now migrated into the small bowel.  Portal vein thrombosis. Suspected associated heterogeneous perfusion of the liver.  Soft tissue/nodularity along the hepatoduodenal ligament is favored to reflect radiation/postsurgical changes, although a discrete 11 mm gastrohepatic node is not excluded.  Mild wall thickening of the right colon, possibly reflecting infectious/inflammatory colitis.     US Abdomen Limited Ruq 10/16/2014: Postsurgical changes.  Thrombosis of the portal vein similar to that seen on recent CT examination.  Heterogeneous liver consistent with that seen on the recent CT examination likely related to the portal vein thrombosis.     Medical Consultants:    Dr. Michel Bickers, Infectious Disease  Dr. Stark Klein, Surgery  Anti-Infectives:    Vancomycin 10/16/14--->  Zosyn 10/16/14--->  Subjective:   Melinda Conrad feels well.  Brown stools noted by [name].  No nausea or vomiting.  Some heartburn and typical abdominal pain, unchanged.    Objective:    Filed Vitals:   10/18/14 0400 10/18/14 0500 10/18/14 0600 10/18/14 0800  BP:  104/71 96/61   Pulse: 58 51 51   Temp: 98.4 F (36.9 C)   98.1 F (36.7 C)  TempSrc: Oral   Oral  Resp: 17 11 11    Height:      Weight:      SpO2:  100% 100% 100%     Intake/Output Summary (Last 24 hours) at 10/18/14 0809 Last data filed at 10/18/14 0755  Gross per 24 hour  Intake   2480 ml  Output    425 ml  Net   2055 ml     Exam: Gen:  NAD Cardiovascular:  RRR, No M/R/G Respiratory:  Lungs CTAB Gastrointestinal:  Abdomen soft, NT/ND, + BS Extremities:  No C/E/C   Data Reviewed:    Labs: Basic Metabolic Panel:  Recent Labs Lab 10/16/14 1339 10/17/14 0345 10/18/14 0410  NA 129* 134* 136  K 3.7 3.1* 3.9  CL 103 110 112  CO2 18* 20 21  GLUCOSE 143* 76 66*  BUN 10 9 8   CREATININE 0.60 0.44* 0.45*  CALCIUM 8.0* 7.6* 7.8*   GFR Estimated Creatinine Clearance: 53.2 mL/min (by C-G formula based on Cr of 0.45). Liver Function Tests:  Recent Labs Lab 10/16/14 1339 10/18/14 0410  AST 35 18  ALT 24 15  ALKPHOS 455* 275*  BILITOT 0.6 0.5  PROT 9.0* 6.8  ALBUMIN 2.1* 1.4*    Recent Labs Lab 10/16/14 1339  LIPASE 14   CBC:  Recent Labs Lab 10/16/14 1339 10/17/14 0345 10/17/14 1916 10/18/14 0410  WBC 14.8* 7.2 5.6 4.6  NEUTROABS 13.6*  --   --   --   HGB 9.6* 7.4* 8.0* 7.5*  HCT 30.1* 23.6* 25.6* 23.8*  MCV 90.7 90.1 91.1 90.2  PLT 233 219 241 249   Sepsis Labs:  Recent Labs Lab 10/16/14 1339 10/16/14 1340 10/17/14 0345 10/17/14 1916 10/18/14 0410  WBC 14.8*  --  7.2 5.6 4.6  LATICACIDVEN  --  2  --   --   --    Microbiology Recent Results (from the past 240 hour(s))  Urine culture     Status: None   Collection Time: 10/16/14  3:35 PM  Result Value Ref Range Status   Specimen Description URINE, RANDOM  Final   Special Requests NONE  Final   Colony Count NO GROWTH Performed at [facility]   Final   Culture NO GROWTH Performed at [facility]   Final   Report Status 10/18/2014 FINAL  Final  Blood culture (routine x 2)     Status: None (Preliminary result)   Collection Time: 10/16/14  3:41 PM  Result Value Ref Range Status   Specimen Description BLOOD RIGHT HAND  Final   Special Requests BOTTLES DRAWN AEROBIC AND ANAEROBIC 4 CC EA  Final   Culture   Final    GRAM NEGATIVE RODS GRAM POSITIVE COCCI IN PAIRS AND CHAINS Note: Gram Stain  Report Called to,Read Back By and Verified With: CAROLYN RN ON 2W AT [id number] BY CASTC Performed at [facility]    Report Status PENDING  Incomplete  Blood culture (routine x 2)     Status: None (Preliminary result)   Collection Time: 10/16/14  4:22 PM  Result Value Ref Range Status   Specimen Description BLOOD RIGHT FOREARM  Final   Special Requests BOTTLES DRAWN AEROBIC AND ANAEROBIC 5ML  Final   Culture   Final    GRAM NEGATIVE RODS Note: Gram Stain Report Called to,Read Back By and Verified With: NICKY CARTER 10/17/14 1425 BY SMITHERSJ Gram Stain Report Called to,Read Back By and Verified With: Stormy Card RN 98P Performed at [facility]    Report Status PENDING  Incomplete  MRSA PCR Screening     Status: None   Collection  Time: 10/16/14  6:20 PM  Result Value Ref Range Status   MRSA by PCR NEGATIVE NEGATIVE Final    Comment:        The GeneXpert MRSA Assay (FDA approved for NASAL specimens only), is one component of a comprehensive MRSA colonization surveillance program. It is not intended to diagnose MRSA infection nor to guide or monitor treatment for MRSA infections.      Medications:   . enoxaparin (LOVENOX) injection  40 mg Subcutaneous Q24H  . feeding supplement (ENSURE COMPLETE)  237 mL Oral TID BM  . fentaNYL  25 mcg Transdermal Q72H  . piperacillin-tazobactam (ZOSYN)  IV  3.375 g Intravenous Q8H  . pregabalin  25 mg Oral QHS  . sertraline  100 mg Oral q morning - 10a  . sodium chloride  3 mL Intravenous Q12H  . sucralfate  1 g Oral TID WC & HS  . vancomycin  750 mg Intravenous Q12H   Continuous Infusions: . 0.9 % NaCl with KCl 40 mEq / L 75 mL/hr at 10/18/14 0600    Time spent: 35 minutes.  The patient is medically complex and requires high complexity decision making, coordination of care with multiple specialists.    LOS: 2 days   Isadore Bokhari  [facility] Pager [phone]. If unable to reach me by pager, please  call my cell phone at [phone].  *Please refer to [email or web address], password [email or web address] to get updated schedule on who will round on this patient, as hospitalists switch teams weekly. If 7PM-7AM, please contact night-coverage at [email or web address], password [email or web address] for any overnight needs.  10/18/2014, 8:09 AM

## 2014-10-19 ENCOUNTER — Telehealth: Payer: Self-pay | Admitting: *Deleted

## 2014-10-19 DIAGNOSIS — IMO0001 Reserved for inherently not codable concepts without codable children: Secondary | ICD-10-CM | POA: Insufficient documentation

## 2014-10-19 DIAGNOSIS — R1084 Generalized abdominal pain: Secondary | ICD-10-CM

## 2014-10-19 DIAGNOSIS — R109 Unspecified abdominal pain: Secondary | ICD-10-CM | POA: Insufficient documentation

## 2014-10-19 LAB — BASIC METABOLIC PANEL
Anion gap: 5 (ref 5–15)
CO2: 21 mmol/L (ref 19–32)
Calcium: 8.2 mg/dL — ABNORMAL LOW (ref 8.4–10.5)
Chloride: 111 mmol/L (ref 96–112)
Creatinine, Ser: 0.49 mg/dL — ABNORMAL LOW (ref 0.50–1.10)
Glucose, Bld: 63 mg/dL — ABNORMAL LOW (ref 70–99)
Potassium: 4.2 mmol/L (ref 3.5–5.1)
Sodium: 137 mmol/L (ref 135–145)

## 2014-10-19 LAB — CBC
HCT: 25.9 % — ABNORMAL LOW (ref 36.0–46.0)
Hemoglobin: 8.1 g/dL — ABNORMAL LOW (ref 12.0–15.0)
MCH: 28.2 pg (ref 26.0–34.0)
MCHC: 31.3 g/dL (ref 30.0–36.0)
MCV: 90.2 fL (ref 78.0–100.0)
Platelets: 296 10*3/uL (ref 150–400)
RBC: 2.87 MIL/uL — ABNORMAL LOW (ref 3.87–5.11)
RDW: 15.5 % (ref 11.5–15.5)
WBC: 4.9 10*3/uL (ref 4.0–10.5)

## 2014-10-19 LAB — VANCOMYCIN, TROUGH: Vancomycin Tr: 17.5 ug/mL (ref 10.0–20.0)

## 2014-10-19 NOTE — Progress Notes (Signed)
ANTIBIOTIC CONSULT NOTE - FOLLOW UP  Pharmacy Consult for Vancomycin and Zosyn Indication: Sepsis  No Known Allergies  Patient Measurements: Height: 5\' 3"  (160 cm) Weight: 100 lb 8.5 oz (45.6 kg) IBW/kg (Calculated) : 52.4  Vital Signs: Temp: 98.4 F (36.9 C) (01/29 0615) Temp Source: Oral (01/29 0615) BP: 142/78 mmHg (01/29 0615) Pulse Rate: 62 (01/29 0615) Intake/Output from previous day: 01/28 0701 - 01/29 0700 In: 2546.3 [P.O.:410; I.V.:1686.3; IV Piggyback:450] Out: 9675 [Urine:1675] Intake/Output from this shift:    Labs:  Recent Labs  10/17/14 0345 10/17/14 1916 10/18/14 0410 10/19/14 0447  WBC 7.2 5.6 4.6 4.9  HGB 7.4* 8.0* 7.5* 8.1*  PLT 219 241 249 296  CREATININE 0.44*  --  0.45* 0.49*   Estimated Creatinine Clearance: 53.2 mL/min (by C-G formula based on Cr of 0.49).  Recent Labs  10/19/14 0447  VANCOTROUGH 17.5     Microbiology: 1/26 blood: 2 of 2 GNR, 2 of 2 GPC - ID and susceptibilities pending 1/26 urine: NGF (UA many bacteria)  Antimicrobials: 1/26 >>Vancomycin >> 1/26 >>Zosyn >>  Drug levels and dosage changes:  1/29: Vancomycin trough 17.5 on 750 mg IV q12h - continue same dosage  Assessment: 64 yr female with pancreatic cancer and was to begin chemo on 1/21 (chemo not given). Recent discharge from hospital on 1/19 where diagnosed with septic shock and Klebsiella pneumonia bacteremia. At home temp = 104F. CXR negative for PNA.  Goal of Therapy:  Vancomycin trough level 15-20 mcg/ml  Zosyn dose per renal function  Today, 10/19/2014 D#4 Vancomycin 750mg  IV q12h D#4 Zosyn 3.375 grams IV q8h (extended-infusion) Remains afebrile Leukocytosis resolved Vancomycin trough therapeutic Serum creatinine stable Final blood culture results still pending  Plan:  1. Continue present vancomycin dosage (750mg  IV q12h). 2. Continue present Zosyn dosage (3.375 grams IV q8h, each dose over 4 hours). 3. Await final blood culture results and  subsequent guidance / orders from ID physician. 4. Follow renal function, clinical course.  Clayburn Pert, PharmD, BCPS Pager: [phone] 10/19/2014  8:12 AM

## 2014-10-19 NOTE — Progress Notes (Signed)
Progress Note   [name] YKD:[id number] DOB: [birth year] DOA: 10/16/2014 PCP: Kevan Ny, MD   Brief Narrative:   Melinda Conrad is an 64 y.o. female with PMH of pancreatic cancer, status post radiation therapy, hypertension and history of substance abuse, recent prolonged admission (09/14/14-10/09/14) for Klebsiella sepsis/bacteremia, who was re-admitted 10/16/13 with high fever and worsening epigastric pain. On admission, lactic acid was normal. Blood pressure was low with systolics in the 39J-67H.  Assessment/Plan:   Principal Problem:   Severe sepsis / sepsis associated hypotension / bacteremia  Continue empiric broad-spectrum antibiotics with vancomycin/Zosyn.  Follow-up blood cultures and urine culture. Preliminary blood culture growing gram-positive cocci in pairs/chains and gram-negative rods. Seen by Dr. Megan Salon in consultation with recommendations to continue current antibiotics until culture data returned.  Blood pressure remains stable status post aggressive IV fluids.  CT of the abdomen concerning for infectious versus inflammatory colitis and migration of pancreatic stent. Dr. Barry Dienes aware of the patient's admission.  Transfer to floor, hemodynamically stable.  Active Problems:   Multifactorial anemia  Patient had recent GI bleeding. 2 g drop in hemoglobin noted 24 hours after admission, likely dilutional, but will continue to monitor closely.  Hemoglobin stable with no further drops.    Portal vein thrombosis  Has not been on therapeutic anticoagulation secondary to recent significant GI bleeding.    Pancreatic cancer  Status post Whipple procedure.    Protein-calorie malnutrition, severe  Continue nutritional supplements.    Hypokalemia  Replaced.    Hyponatremia  Improved with IV fluids.    Elevated alkaline phosphatase level  Has been elevated since 10/02/14.  Status post abdominal ultrasound which only shows known portal vein  thrombosis.    DVT Prophylaxis  On Lovenox.  Code Status: Full. Family Communication: Husband, [name] updated at the bedside. Disposition Plan: Home when stable.   IV Access:    Peripheral IV   Procedures and diagnostic studies:   Dg Chest 2 View 10/16/2014: Areas of scattered interstitial fibrosis. Recent interstitial edema has cleared. Currently no edema or consolidation. No effusions.    Ct Abdomen Pelvis W Contrast 10/16/2014: Postsurgical changes related to Whipple procedure, as above. Please note that the prior pancreatic stent has now migrated into the small bowel.  Portal vein thrombosis. Suspected associated heterogeneous perfusion of the liver.  Soft tissue/nodularity along the hepatoduodenal ligament is favored to reflect radiation/postsurgical changes, although a discrete 11 mm gastrohepatic node is not excluded.  Mild wall thickening of the right colon, possibly reflecting infectious/inflammatory colitis.     US Abdomen Limited Ruq 10/16/2014: Postsurgical changes.  Thrombosis of the portal vein similar to that seen on recent CT examination.  Heterogeneous liver consistent with that seen on the recent CT examination likely related to the portal vein thrombosis.     Medical Consultants:    Dr. Michel Bickers, Infectious Disease  Dr. Stark Klein, Surgery  Anti-Infectives:    Vancomycin 10/16/14--->  Zosyn 10/16/14--->  Subjective:   [name] says she had a bad night with reflux and some abdominal pain.  No nausea/vomiting.    Objective:    Filed Vitals:   10/18/14 2000 10/18/14 2100 10/18/14 2249 10/19/14 0615  BP: 148/80 141/69 134/73 142/78  Pulse: 63 63 75 62  Temp:   98.1 F (36.7 C) 98.4 F (36.9 C)  TempSrc:   Oral Oral  Resp: 17 16 16 16   Height:      Weight:      SpO2:  100% 100% 100% 100%    Intake/Output Summary (Last 24 hours) at 10/19/14 0820 Last data filed at 10/19/14 0541  Gross per 24 hour  Intake 2546.25 ml  Output   1600  ml  Net 946.25 ml    Exam: Gen:  NAD Cardiovascular:  RRR, No M/R/G Respiratory:  Lungs CTAB Gastrointestinal:  Abdomen soft, NT/ND, + BS Extremities:  No C/E/C   Data Reviewed:    Labs: Basic Metabolic Panel:  Recent Labs Lab 10/16/14 1339 10/17/14 0345 10/18/14 0410 10/19/14 0447  NA 129* 134* 136 137  K 3.7 3.1* 3.9 4.2  CL 103 110 112 111  CO2 18* 20 21 21   GLUCOSE 143* 76 66* 63*  BUN 10 9 8  <5*  CREATININE 0.60 0.44* 0.45* 0.49*  CALCIUM 8.0* 7.6* 7.8* 8.2*   GFR Estimated Creatinine Clearance: 53.2 mL/min (by C-G formula based on Cr of 0.49). Liver Function Tests:  Recent Labs Lab 10/16/14 1339 10/18/14 0410  AST 35 18  ALT 24 15  ALKPHOS 455* 275*  BILITOT 0.6 0.5  PROT 9.0* 6.8  ALBUMIN 2.1* 1.4*    Recent Labs Lab 10/16/14 1339  LIPASE 14   CBC:  Recent Labs Lab 10/16/14 1339 10/17/14 0345 10/17/14 1916 10/18/14 0410 10/19/14 0447  WBC 14.8* 7.2 5.6 4.6 4.9  NEUTROABS 13.6*  --   --   --   --   HGB 9.6* 7.4* 8.0* 7.5* 8.1*  HCT 30.1* 23.6* 25.6* 23.8* 25.9*  MCV 90.7 90.1 91.1 90.2 90.2  PLT 233 219 241 249 296   Sepsis Labs:  Recent Labs Lab 10/16/14 1340 10/17/14 0345 10/17/14 1916 10/18/14 0410 10/19/14 0447  WBC  --  7.2 5.6 4.6 4.9  LATICACIDVEN 2  --   --   --   --    Microbiology Recent Results (from the past 240 hour(s))  Urine culture     Status: None   Collection Time: 10/16/14  3:35 PM  Result Value Ref Range Status   Specimen Description URINE, RANDOM  Final   Special Requests NONE  Final   Colony Count NO GROWTH Performed at [facility]   Final   Culture NO GROWTH Performed at [facility]   Final   Report Status 10/18/2014 FINAL  Final  Blood culture (routine x 2)     Status: None (Preliminary result)   Collection Time: 10/16/14  3:41 PM  Result Value Ref Range Status   Specimen Description BLOOD RIGHT HAND  Final   Special Requests BOTTLES DRAWN AEROBIC AND ANAEROBIC 4 CC EA   Final   Culture   Final    GRAM NEGATIVE RODS GRAM POSITIVE COCCI IN PAIRS AND CHAINS Note: Gram Stain Report Called to,Read Back By and Verified With: CAROLYN RN ON 2W AT [id number] BY CASTC Performed at [facility]    Report Status PENDING  Incomplete  Blood culture (routine x 2)     Status: None (Preliminary result)   Collection Time: 10/16/14  4:22 PM  Result Value Ref Range Status   Specimen Description BLOOD RIGHT FOREARM  Final   Special Requests BOTTLES DRAWN AEROBIC AND ANAEROBIC 5ML  Final   Culture   Final    GRAM NEGATIVE RODS Note: Gram Stain Report Called to,Read Back By and Verified With: NICKY CARTER 10/17/14 1425 BY SMITHERSJ GRAM POSITIVE COCCI IN CLUSTERS Note: Gram Stain Report Called to,Read Back By and Verified With: Jackey Loge B Performed at [facility]  Report Status PENDING  Incomplete  MRSA PCR Screening     Status: None   Collection Time: 10/16/14  6:20 PM  Result Value Ref Range Status   MRSA by PCR NEGATIVE NEGATIVE Final    Comment:        The GeneXpert MRSA Assay (FDA approved for NASAL specimens only), is one component of a comprehensive MRSA colonization surveillance program. It is not intended to diagnose MRSA infection nor to guide or monitor treatment for MRSA infections.      Medications:   . enoxaparin (LOVENOX) injection  40 mg Subcutaneous Q24H  . feeding supplement (ENSURE COMPLETE)  237 mL Oral TID BM  . fentaNYL  25 mcg Transdermal Q72H  . piperacillin-tazobactam (ZOSYN)  IV  3.375 g Intravenous Q8H  . pregabalin  25 mg Oral QHS  . sertraline  100 mg Oral q morning - 10a  . sodium chloride  3 mL Intravenous Q12H  . sucralfate  1 g Oral TID WC & HS  . vancomycin  750 mg Intravenous Q12H   Continuous Infusions: . 0.9 % NaCl with KCl 40 mEq / L 75 mL/hr (10/19/14 0541)    Time spent: 25 minutes.    LOS: 3 days   [facility] Pager [phone]. If unable to reach me by  pager, please call my cell phone at [phone].  *Please refer to [email or web address], password [email or web address] to get updated schedule on who will round on this patient, as hospitalists switch teams weekly. If 7PM-7AM, please contact night-coverage at [email or web address], password [email or web address] for any overnight needs.  10/19/2014, 8:20 AM

## 2014-10-19 NOTE — Progress Notes (Signed)
Patient ID: Melinda Conrad, female   DOB: December 13, 1950, 64 y.o.   MRN: [id number]         [facility]    Date of Admission:  10/16/2014           Day 4 vancomycin        Day 4 piperacillin tazobactam  Principal Problem:   Recurrent bacteremia Active Problems:   Septic shock   Pancreatic cancer   Depression   Protein-calorie malnutrition, severe   Portal vein thrombosis   Anemia in neoplastic disease   Hepatitis C without hepatic coma   . enoxaparin (LOVENOX) injection  40 mg Subcutaneous Q24H  . feeding supplement (ENSURE COMPLETE)  237 mL Oral TID BM  . fentaNYL  25 mcg Transdermal Q72H  . piperacillin-tazobactam (ZOSYN)  IV  3.375 g Intravenous Q8H  . pregabalin  25 mg Oral QHS  . sertraline  100 mg Oral q morning - 10a  . sodium chloride  3 mL Intravenous Q12H  . sucralfate  1 g Oral TID WC & HS  . vancomycin  750 mg Intravenous Q12H    Subjective: She is complaining of in "heartburn". She describes it as intermittent sharp, shooting pains in her abdomen that she has not had before. She thinks it gets worse if she eats. She did not try to eat any breakfast. She rates the pain as 6 out of 10.   Review of Systems: Pertinent items are noted in HPI.  Past Medical History  Diagnosis Date  . Arthritis   . Depression   . H/O: substance abuse   . Back pain   . Personal history of colonic polyp - adenoma 10/26/2013    10/26/2013 diminutive sigmoid polyp removed    . Pancreatitis, acute   . Hypertension   . Osteoporosis   . Radiation 04/30/14-06/08/14    pancreas 50 gray  . Cancer     pancreatic cancer  . Hemorrhagic shock 09/14/2014  . Acute GI bleeding 09/14/2014  . Thrombocytopenia 09/16/2014  . 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 Acute respiratory failure with hypoxia   . Bacteremia due to Klebsiella pneumoniae   . Anemia due to acute blood loss 09/26/2014    History  Substance Use Topics  . Smoking status: Current Every Day Smoker --  0.25 packs/day for 40 years    Types: Cigarettes  . Smokeless tobacco: Current User  . Alcohol Use: No     Comment: occasionally. Quit  , none in 3weeks"only drank before  to help with pain before ERD visit"    Family History  Problem Relation Age of Onset  . Colon cancer Neg Hx   . Esophageal cancer Neg Hx   . Rectal cancer Neg Hx   . Stomach cancer Neg Hx   . CAD Mother   . Cancer Father   . Cirrhosis Sister     she is a heavy drinker.    No Known Allergies  OBJECTIVE: Blood pressure 142/78, pulse 62, temperature 98.4 F (36.9 C), temperature source Oral, resp. rate 16, height 5\' 3"  (1.6 m), weight 100 lb 8.5 oz (45.6 kg), SpO2 100 %.   General: Alert and thin. She appearsuncomfortable due to pain  Lungs: Clear Cor: Regular S1 and S2 with no murmur Abdomen: Soft withmoderate upper abdominal tenderness. Her midline surgical incision has healed.  Lab Results Lab Results  Component Value Date   WBC 4.9 10/19/2014   HGB 8.1* 10/19/2014   HCT 25.9* 10/19/2014  MCV 90.2 10/19/2014   PLT 296 10/19/2014    Lab Results  Component Value Date   CREATININE 0.49* 10/19/2014   BUN <5* 10/19/2014   NA 137 10/19/2014   K 4.2 10/19/2014   CL 111 10/19/2014   CO2 21 10/19/2014    Lab Results  Component Value Date   ALT 15 10/18/2014   AST 18 10/18/2014   ALKPHOS 275* 10/18/2014   BILITOT 0.5 10/18/2014     Microbiology: Recent Results (from the past 240 hour(s))  Urine culture     Status: None   Collection Time: 10/16/14  3:35 PM  Result Value Ref Range Status   Specimen Description URINE, RANDOM  Final   Special Requests NONE  Final   Colony Count NO GROWTH Performed at [facility]   Final   Culture NO GROWTH Performed at [facility]   Final   Report Status 10/18/2014 FINAL  Final  Blood culture (routine x 2)     Status: None (Preliminary result)   Collection Time: 10/16/14  3:41 PM  Result Value Ref Range Status   Specimen Description BLOOD  RIGHT HAND  Final   Special Requests BOTTLES DRAWN AEROBIC AND ANAEROBIC 4 CC EA  Final   Culture   Final    GRAM NEGATIVE RODS GRAM POSITIVE COCCI IN PAIRS AND CHAINS Note: Gram Stain Report Called to,Read Back By and Verified With: CAROLYN RN ON 2W AT [id number] BY CASTC Performed at [facility]    Report Status PENDING  Incomplete  Blood culture (routine x 2)     Status: None (Preliminary result)   Collection Time: 10/16/14  4:22 PM  Result Value Ref Range Status   Specimen Description BLOOD RIGHT FOREARM  Final   Special Requests BOTTLES DRAWN AEROBIC AND ANAEROBIC 5ML  Final   Culture   Final    GRAM NEGATIVE RODS Note: Gram Stain Report Called to,Read Back By and Verified With: NICKY CARTER 10/17/14 1425 BY SMITHERSJ GRAM POSITIVE COCCI IN CLUSTERS Note: Gram Stain Report Called to,Read Back By and Verified With: Jackey Loge B Performed at [facility]    Report Status PENDING  Incomplete  MRSA PCR Screening     Status: None   Collection Time: 10/16/14  6:20 PM  Result Value Ref Range Status   MRSA by PCR NEGATIVE NEGATIVE Final    Comment:        The GeneXpert MRSA Assay (FDA approved for NASAL specimens only), is one component of a comprehensive MRSA colonization surveillance program. It is not intended to diagnose MRSA infection nor to guide or monitor treatment for MRSA infections.     Assessment: She has recurrent, polymicrobial bacteremia with gram-negative rods, gram positive cocci in pairs and gram-positive cocci in clusters. I strongly suspect that this is coming from an intra-abdominal source related to her recent pancreaticoduodenectomy.  Plan: 1. Continue current antibiotics pending final blood culture results 2. I will have my partner, Dr. Linus Salmons, review blood culture results this weekend and make any necessary adjustments in her antibiotic therapy  Michel Bickers, MD [facility] [phone] pager   [phone] cell 10/19/2014, 9:58 AM

## 2014-10-19 NOTE — Progress Notes (Signed)
Patient ID: Melinda Conrad, female   DOB: [birth year], 64 y.o.   MRN: [id number]  Subjective: Pt awake in bed. Stated last night was a bad night. Complains of reflux and abdominal pain in the left lower quadrant; states meds for both haven't helped. No n/v.   Objective: Vital signs in last 24 hours: Temp:  [98 F (36.7 C)-98.6 F (37 C)] 98.4 F (36.9 C) (01/29 0615) Pulse Rate:  [61-75] 62 (01/29 0615) Resp:  [14-21] 16 (01/29 0615) BP: (123-156)/(69-80) 142/78 mmHg (01/29 0615) SpO2:  [100 %] 100 % (01/29 0615) Last BM Date: 10/18/14  Intake/Output from previous day: 01/28 0701 - 01/29 0700 In: 2546.3 [P.O.:410; I.V.:1686.3; IV Piggyback:450] Out: 1675 [Urine:1675] Intake/Output this shift: Total I/O In: -  Out: 300 [Urine:300]  General: Cachectic, mild distress Resp: breathing comfortably GI: soft, tender in LLQ  Lab Results:   Recent Labs  10/18/14 0410 10/19/14 0447  WBC 4.6 4.9  HGB 7.5* 8.1*  HCT 23.8* 25.9*  PLT 249 296   BMET  Recent Labs  10/18/14 0410 10/19/14 0447  NA 136 137  K 3.9 4.2  CL 112 111  CO2 21 21  GLUCOSE 66* 63*  BUN 8 <5*  CREATININE 0.45* 0.49*  CALCIUM 7.8* 8.2*   PT/INR No results for input(s): LABPROT, INR in the last 72 hours. ABG No results for input(s): PHART, HCO3 in the last 72 hours.  Invalid input(s): PCO2, PO2  Studies/Results: No results found.  Anti-infectives: Anti-infectives    Start     Dose/Rate Route Frequency Ordered Stop   10/17/14 0600  vancomycin (VANCOCIN) IVPB 750 mg/150 ml premix     750 mg150 mL/hr over 60 Minutes Intravenous Every 12 hours 10/16/14 1853     10/17/14 0100  piperacillin-tazobactam (ZOSYN) IVPB 3.375 g     3.375 g12.5 mL/hr over 240 Minutes Intravenous Every 8 hours 10/16/14 1853     10/16/14 1630  piperacillin-tazobactam (ZOSYN) IVPB 3.375 g     3.375 g100 mL/hr over 30 Minutes Intravenous  Once 10/16/14 1624 10/16/14 1723   10/16/14 1630  vancomycin (VANCOCIN) IVPB 1000  mg/200 mL premix     1,000 mg200 mL/hr over 60 Minutes Intravenous  Once 10/16/14 1624 10/16/14 1745      Assessment/Plan: S/p whipple 08/20/14 Readmission for fever. ? Colitis Blood cultures show GNR and GPC clusters, awaiting speciation    LOS: 3 days    Chester Holstein 10/19/2014

## 2014-10-19 NOTE — Telephone Encounter (Signed)
Call from pt's daughter reporting she has been admitted to [facility]. 2/1 appt will be rescheduled.

## 2014-10-20 LAB — CBC
HCT: 25.5 % — ABNORMAL LOW (ref 36.0–46.0)
Hemoglobin: 8 g/dL — ABNORMAL LOW (ref 12.0–15.0)
MCH: 28.6 pg (ref 26.0–34.0)
MCHC: 31.4 g/dL (ref 30.0–36.0)
MCV: 91.1 fL (ref 78.0–100.0)
PLATELETS: 263 10*3/uL (ref 150–400)
RBC: 2.8 MIL/uL — ABNORMAL LOW (ref 3.87–5.11)
RDW: 15.3 % (ref 11.5–15.5)
WBC: 3.8 10*3/uL — AB (ref 4.0–10.5)

## 2014-10-20 LAB — BASIC METABOLIC PANEL
ANION GAP: 4 — AB (ref 5–15)
CO2: 21 mmol/L (ref 19–32)
CREATININE: 0.53 mg/dL (ref 0.50–1.10)
Calcium: 8.1 mg/dL — ABNORMAL LOW (ref 8.4–10.5)
Chloride: 113 mmol/L — ABNORMAL HIGH (ref 96–112)
GFR calc non Af Amer: >90 mL/min (ref >90)
Glucose, Bld: 59 mg/dL — ABNORMAL LOW (ref 70–99)
Potassium: 4.7 mmol/L (ref 3.5–5.1)
Sodium: 138 mmol/L (ref 135–145)

## 2014-10-20 NOTE — Progress Notes (Signed)
Patient ID: Melinda Conrad, female   DOB: 28-Apr-1951, 64 y.o.   MRN: [id number]    Subjective: Having some upper abdominal pain last night but says it is better this morning. Wants to eat some breakfast. Had a normal bowel movement yesterday.  Objective: Vital signs in last 24 hours: Temp:  [98 F (36.7 C)-98.7 F (37.1 C)] 98 F (36.7 C) (01/30 0547) Pulse Rate:  [50-55] 50 (01/30 0547) Resp:  [16] 16 (01/30 0547) BP: (122-137)/(63-73) 122/63 mmHg (01/30 0547) SpO2:  [100 %] 100 % (01/30 0547) Last BM Date: 10/18/14  Intake/Output from previous day: 01/29 0701 - 01/30 0700 In: 2178.8 [P.O.:300; I.V.:1878.8] Out: 1400 [Urine:1400] Intake/Output this shift:    General appearance: alert, cooperative, no distress and chronically ill-appearing Resp: clear to auscultation bilaterally GI: firm, minimally tender.  Lab Results:   Recent Labs  10/19/14 0447 10/20/14 0500  WBC 4.9 3.8*  HGB 8.1* 8.0*  HCT 25.9* 25.5*  PLT 296 263   BMET  Recent Labs  10/19/14 0447 10/20/14 0500  NA 137 138  K 4.2 4.7  CL 111 113*  CO2 21 21  GLUCOSE 63* 59*  BUN <5* <5*  CREATININE 0.49* 0.53  CALCIUM 8.2* 8.1*   Recent Results (from the past 240 hour(s))  Urine culture     Status: None   Collection Time: 10/16/14  3:35 PM  Result Value Ref Range Status   Specimen Description URINE, RANDOM  Final   Special Requests NONE  Final   Colony Count NO GROWTH Performed at [facility]   Final   Culture NO GROWTH Performed at [facility]   Final   Report Status 10/18/2014 FINAL  Final  Blood culture (routine x 2)     Status: None (Preliminary result)   Collection Time: 10/16/14  3:41 PM  Result Value Ref Range Status   Specimen Description BLOOD RIGHT HAND  Final   Special Requests BOTTLES DRAWN AEROBIC AND ANAEROBIC 4 CC EA  Final   Culture   Final    GRAM NEGATIVE RODS GRAM POSITIVE COCCI IN PAIRS AND CHAINS Note: Gram Stain Report Called to,Read Back By and  Verified With: CAROLYN RN ON 2W AT [id number] BY CASTC Performed at [facility]    Report Status PENDING  Incomplete  Blood culture (routine x 2)     Status: None (Preliminary result)   Collection Time: 10/16/14  4:22 PM  Result Value Ref Range Status   Specimen Description BLOOD RIGHT FOREARM  Final   Special Requests BOTTLES DRAWN AEROBIC AND ANAEROBIC 5ML  Final   Culture   Final    GRAM NEGATIVE RODS Note: Gram Stain Report Called to,Read Back By and Verified With: NICKY CARTER 10/17/14 1425 BY SMITHERSJ GRAM POSITIVE COCCI IN CLUSTERS Note: Gram Stain Report Called to,Read Back By and Verified With: Jackey Loge B Performed at [facility]    Report Status PENDING  Incomplete  MRSA PCR Screening     Status: None   Collection Time: 10/16/14  6:20 PM  Result Value Ref Range Status   MRSA by PCR NEGATIVE NEGATIVE Final    Comment:        The GeneXpert MRSA Assay (FDA approved for NASAL specimens only), is one component of a comprehensive MRSA colonization surveillance program. It is not intended to diagnose MRSA infection nor to guide or monitor treatment for MRSA infections.     Studies/Results: No results found.  Anti-infectives: Anti-infectives  Start     Dose/Rate Route Frequency Ordered Stop   10/17/14 0600  vancomycin (VANCOCIN) IVPB 750 mg/150 ml premix     750 mg150 mL/hr over 60 Minutes Intravenous Every 12 hours 10/16/14 1853     10/17/14 0100  piperacillin-tazobactam (ZOSYN) IVPB 3.375 g     3.375 g12.5 mL/hr over 240 Minutes Intravenous Every 8 hours 10/16/14 1853     10/16/14 1630  piperacillin-tazobactam (ZOSYN) IVPB 3.375 g     3.375 g100 mL/hr over 30 Minutes Intravenous  Once 10/16/14 1624 10/16/14 1723   10/16/14 1630  vancomycin (VANCOCIN) IVPB 1000 mg/200 mL premix     1,000 mg200 mL/hr over 60 Minutes Intravenous  Once 10/16/14 1624 10/16/14 1745      Assessment/Plan: Bacteremia with multiple organisms. 2 months status  post Whipple. No obvious source on CT scan. Colon somewhat thickened, question colitis Appears stable. On IV antibiotics and TNA. Portal vein thrombosis, on heparin.     LOS: 4 days    Rylen Hou T 10/20/2014

## 2014-10-20 NOTE — Progress Notes (Signed)
Progress Note   Melinda Conrad GQQ:[id number] DOB: [birth year] DOA: 10/16/2014 PCP: Kevan Ny, MD   Brief Narrative:   Melinda Conrad is an 64 y.o. female with PMH of pancreatic cancer, status post radiation therapy, hypertension and history of substance abuse, recent prolonged admission (09/14/14-10/09/14) for Klebsiella sepsis/bacteremia, who was re-admitted 10/16/13 with high fever and worsening epigastric pain. On admission, lactic acid was normal. Blood pressure was low with systolics in the 67T-24P.  Assessment/Plan:   Principal Problem:   Severe sepsis / sepsis associated hypotension / bacteremia  Continue empiric broad-spectrum antibiotics with vancomycin/Zosyn.  Follow-up blood cultures and urine culture. Preliminary blood culture growing gram-positive cocci in pairs/chains and gram-negative rods. Seen by Dr. Megan Salon in consultation with recommendations to continue current antibiotics until culture data returned.  Blood pressure remains stable status post aggressive IV fluids.  CT of the abdomen concerning for infectious versus inflammatory colitis and migration of pancreatic stent. Dr. Barry Dienes aware of the patient's admission.  Active Problems:   Multifactorial anemia  Patient had recent GI bleeding. 2 g drop in hemoglobin noted 24 hours after admission, likely dilutional, but will continue to monitor closely.  Hemoglobin stable with no further drops.    Portal vein thrombosis  Has not been on therapeutic anticoagulation secondary to recent significant GI bleeding.    Pancreatic cancer  Status post Whipple procedure.    Protein-calorie malnutrition, severe  Continue nutritional supplements.    Hypokalemia  Replaced.    Hyponatremia  Improved with IV fluids.    Elevated alkaline phosphatase level  Has been elevated since 10/02/14.  Status post abdominal ultrasound which only shows known portal vein thrombosis.    DVT Prophylaxis  On  Lovenox.  Code Status: Conrad. Family Communication: Husband, [name] updated at the bedside 10/19/14. Disposition Plan: Home when stable.   IV Access:    Peripheral IV   Procedures and diagnostic studies:   Dg Chest 2 View 10/16/2014: Areas of scattered interstitial fibrosis. Recent interstitial edema has cleared. Currently no edema or consolidation. No effusions.    Ct Abdomen Pelvis W Contrast 10/16/2014: Postsurgical changes related to Whipple procedure, as above. Please note that the prior pancreatic stent has now migrated into the small bowel.  Portal vein thrombosis. Suspected associated heterogeneous perfusion of the liver.  Soft tissue/nodularity along the hepatoduodenal ligament is favored to reflect radiation/postsurgical changes, although a discrete 11 mm gastrohepatic node is not excluded.  Mild wall thickening of the right colon, possibly reflecting infectious/inflammatory colitis.     US Abdomen Limited Ruq 10/16/2014: Postsurgical changes.  Thrombosis of the portal vein similar to that seen on recent CT examination.  Heterogeneous liver consistent with that seen on the recent CT examination likely related to the portal vein thrombosis.     Medical Consultants:    Dr. Michel Bickers, Infectious Disease  Dr. Stark Klein, Surgery  Anti-Infectives:    Vancomycin 10/16/14--->  Zosyn 10/16/14--->  Subjective:   Melinda Conrad denies pain this morning.  Bowels moved yesterday.  No nausea/vomiting.  Appetite OK.  Objective:    Filed Vitals:   10/19/14 0615 10/19/14 1731 10/19/14 2124 10/20/14 0547  BP: 142/78 137/68 123/73 122/63  Pulse: 62 55 51 50  Temp: 98.4 F (36.9 C) 98.2 F (36.8 C) 98.7 F (37.1 C) 98 F (36.7 C)  TempSrc: Oral Oral Oral Oral  Resp: 16 16 16 16   Height:      Weight:      SpO2: 100% 100%  100% 100%    Intake/Output Summary (Last 24 hours) at 10/20/14 0904 Last data filed at 10/20/14 0600  Gross per 24 hour  Intake 1938.75 ml   Output   1100 ml  Net 838.75 ml    Exam: Gen:  NAD, sleepy Cardiovascular:  RRR, No M/R/G Respiratory:  Lungs CTAB Gastrointestinal:  Abdomen soft, NT/ND, + BS Extremities:  No C/E/C   Data Reviewed:    Labs: Basic Metabolic Panel:  Recent Labs Lab 10/16/14 1339 10/17/14 0345 10/18/14 0410 10/19/14 0447 10/20/14 0500  NA 129* 134* 136 137 138  K 3.7 3.1* 3.9 4.2 4.7  CL 103 110 112 111 113*  CO2 18* 20 21 21 21   GLUCOSE 143* 76 66* 63* 59*  BUN 10 9 8  <5* <5*  CREATININE 0.60 0.44* 0.45* 0.49* 0.53  CALCIUM 8.0* 7.6* 7.8* 8.2* 8.1*   GFR Estimated Creatinine Clearance: 53.2 mL/min (by C-G formula based on Cr of 0.53). Liver Function Tests:  Recent Labs Lab 10/16/14 1339 10/18/14 0410  AST 35 18  ALT 24 15  ALKPHOS 455* 275*  BILITOT 0.6 0.5  PROT 9.0* 6.8  ALBUMIN 2.1* 1.4*    Recent Labs Lab 10/16/14 1339  LIPASE 14   CBC:  Recent Labs Lab 10/16/14 1339 10/17/14 0345 10/17/14 1916 10/18/14 0410 10/19/14 0447 10/20/14 0500  WBC 14.8* 7.2 5.6 4.6 4.9 3.8*  NEUTROABS 13.6*  --   --   --   --   --   HGB 9.6* 7.4* 8.0* 7.5* 8.1* 8.0*  HCT 30.1* 23.6* 25.6* 23.8* 25.9* 25.5*  MCV 90.7 90.1 91.1 90.2 90.2 91.1  PLT 233 219 241 249 296 263   Sepsis Labs:  Recent Labs Lab 10/16/14 1340  10/17/14 1916 10/18/14 0410 10/19/14 0447 10/20/14 0500  WBC  --   < > 5.6 4.6 4.9 3.8*  LATICACIDVEN 2  --   --   --   --   --   < > = values in this interval not displayed. Microbiology Recent Results (from the past 240 hour(s))  Urine culture     Status: None   Collection Time: 10/16/14  3:35 PM  Result Value Ref Range Status   Specimen Description URINE, RANDOM  Final   Special Requests NONE  Final   Colony Count NO GROWTH Performed at [facility]   Final   Culture NO GROWTH Performed at [facility]   Final   Report Status 10/18/2014 FINAL  Final  Blood culture (routine x 2)     Status: None (Preliminary result)    Collection Time: 10/16/14  3:41 PM  Result Value Ref Range Status   Specimen Description BLOOD RIGHT HAND  Final   Special Requests BOTTLES DRAWN AEROBIC AND ANAEROBIC 4 CC EA  Final   Culture   Final    GRAM NEGATIVE RODS GRAM POSITIVE COCCI IN PAIRS AND CHAINS Note: Gram Stain Report Called to,Read Back By and Verified With: CAROLYN RN ON 2W AT [id number] BY CASTC Performed at [facility]    Report Status PENDING  Incomplete  Blood culture (routine x 2)     Status: None (Preliminary result)   Collection Time: 10/16/14  4:22 PM  Result Value Ref Range Status   Specimen Description BLOOD RIGHT FOREARM  Final   Special Requests BOTTLES DRAWN AEROBIC AND ANAEROBIC 5ML  Final   Culture   Final    GRAM NEGATIVE RODS Note: Gram Stain Report Called to,Read Back By  and Verified With: NICKY CARTER 10/17/14 1425 BY SMITHERSJ GRAM POSITIVE COCCI IN CLUSTERS Note: Gram Stain Report Called to,Read Back By and Verified With: Jackey Loge B Performed at [facility]    Report Status PENDING  Incomplete  MRSA PCR Screening     Status: None   Collection Time: 10/16/14  6:20 PM  Result Value Ref Range Status   MRSA by PCR NEGATIVE NEGATIVE Final    Comment:        The GeneXpert MRSA Assay (FDA approved for NASAL specimens only), is one component of a comprehensive MRSA colonization surveillance program. It is not intended to diagnose MRSA infection nor to guide or monitor treatment for MRSA infections.      Medications:   . enoxaparin (LOVENOX) injection  40 mg Subcutaneous Q24H  . feeding supplement (ENSURE COMPLETE)  237 mL Oral TID BM  . fentaNYL  25 mcg Transdermal Q72H  . piperacillin-tazobactam (ZOSYN)  IV  3.375 g Intravenous Q8H  . pregabalin  25 mg Oral QHS  . sertraline  100 mg Oral q morning - 10a  . sodium chloride  3 mL Intravenous Q12H  . sucralfate  1 g Oral TID WC & HS  . vancomycin  750 mg Intravenous Q12H   Continuous Infusions: . 0.9 %  NaCl with KCl 40 mEq / L 75 mL/hr (10/20/14 0842)    Time spent: 25 minutes.    LOS: 4 days   [facility] Pager [phone]. If unable to reach me by pager, please call my cell phone at [phone].  *Please refer to [email or web address], password [email or web address] to get updated schedule on who will round on this patient, as hospitalists switch teams weekly. If 7PM-7AM, please contact night-coverage at [email or web address], password [email or web address] for any overnight needs.  10/20/2014, 9:04 AM

## 2014-10-21 DIAGNOSIS — B955 Unspecified streptococcus as the cause of diseases classified elsewhere: Secondary | ICD-10-CM

## 2014-10-21 DIAGNOSIS — B961 Klebsiella pneumoniae [K. pneumoniae] as the cause of diseases classified elsewhere: Secondary | ICD-10-CM

## 2014-10-21 LAB — CULTURE, BLOOD (ROUTINE X 2)

## 2014-10-21 MED ORDER — SODIUM CHLORIDE 0.9 % IV SOLN
3.0000 g | Freq: Four times a day (QID) | INTRAVENOUS | Status: DC
  Administered 2014-10-21 – 2014-10-23 (×8): 3 g via INTRAVENOUS
  Filled 2014-10-21 (×9): qty 3

## 2014-10-21 NOTE — Progress Notes (Signed)
ANTIBIOTIC CONSULT NOTE - INITIAL  Pharmacy Consult for Unasyn Indication: bacteremia and intra-abdominal infection  No Known Allergies  Patient Measurements: Height: 5\' 3"  (160 cm) Weight: 100 lb 8.5 oz (45.6 kg) IBW/kg (Calculated) : 52.4  Assessment: 49 yoF admitted 1/26 with high fever and worsening abdominal pain. Pt with pancreatic cancer, s/p Whipple procedure 08/07/14, and was to begin chemo on 1/21 (chemo not given). Noted recent prolonged admission 12/25 - 1/19 for septic shock and Klebsiella pneumonia bacteremia. Pharmacy was originally consulted 1/26 to dose vancomycin and Zosyn for sepsis. ID was consulted 1/27 for recurrent polymicrobial bacteremia. MS suspects intra-abdominal infection 2/2 to recent surgery as source. Pharmacy is now consulted to change antibiotics to Unasyn monotherapy with identification and speciation of blood cultures.  Antiinfectives  Day 6 antibiotics 1/26 >> vancomycin >> 1/31 1/26 >> Zosyn >> 1/31 1/31 >> Unasyn >>  Labs / vitals Tmax: remains afebrile WBCs: now slightly low at 3.8 Renal: SCr low/slight increase to 0.53 (appears at patient's baseline), CrCl 53 CG, 83 N (using 0.8)  Microbiology 1/26 blood x2: Klebsiella pneumonia (R= ampicillin only)            Streptococcus anginosus (penicillin sensitive) 1/26 urine: NGF (UA many bacteria)  Drug level / dose changes info: 1/29 VT 17.5 before 5th dose of 750mg  q12h - cont same dosage   Goal of Therapy:  Unasyn per indication and renal function  Plan:  - start Unasyn 3g IV q6h tonight at 1800 (8 hours after last dose of Zosyn extended infusion) - CBC and Bmet in the AM - Pharmacy will follow-up daily  Thank you for the consult.  Currie Paris, PharmD, BCPS Pager: [phone] Pharmacy: [phone] 10/21/2014 11:42 AM

## 2014-10-21 NOTE — Progress Notes (Signed)
Patient ID: Melinda Conrad, female   DOB: 1951-06-15, 64 y.o.   MRN: [id number] Patient ID: Melinda Conrad, female   DOB: June 14, 1951, 65 y.o.   MRN: [id number]    Subjective: Feeling "alright". Ate some breakfast. Minimal abdominal discomfort.  No new complaints.  Objective: Vital signs in last 24 hours: Temp:  [98.1 F (36.7 C)-98.4 F (36.9 C)] 98.1 F (36.7 C) (01/31 0602) Pulse Rate:  [49-55] 50 (01/31 0602) Resp:  [16] 16 (01/31 0602) BP: (93-114)/(51-62) 113/54 mmHg (01/31 0602) SpO2:  [100 %] 100 % (01/31 0602) Last BM Date: 10/20/14  Intake/Output from previous day: 01/30 0701 - 01/31 0700 In: 3220 [P.O.:720; I.V.:1800; IV Piggyback:700] Out: 600 [Urine:600] Intake/Output this shift:    General appearance: alert, cooperative, no distress and chronically ill-appearing Resp: clear to auscultation bilaterally GI: firm, minimally tender.  Lab Results:   Recent Labs  10/19/14 0447 10/20/14 0500  WBC 4.9 3.8*  HGB 8.1* 8.0*  HCT 25.9* 25.5*  PLT 296 263   BMET  Recent Labs  10/19/14 0447 10/20/14 0500  NA 137 138  K 4.2 4.7  CL 111 113*  CO2 21 21  GLUCOSE 63* 59*  BUN <5* <5*  CREATININE 0.49* 0.53  CALCIUM 8.2* 8.1*   Recent Results (from the past 240 hour(s))  Urine culture     Status: None   Collection Time: 10/16/14  3:35 PM  Result Value Ref Range Status   Specimen Description URINE, RANDOM  Final   Special Requests NONE  Final   Colony Count NO GROWTH Performed at [facility]   Final   Culture NO GROWTH Performed at [facility]   Final   Report Status 10/18/2014 FINAL  Final  Blood culture (routine x 2)     Status: None (Preliminary result)   Collection Time: 10/16/14  3:41 PM  Result Value Ref Range Status   Specimen Description BLOOD RIGHT HAND  Final   Special Requests BOTTLES DRAWN AEROBIC AND ANAEROBIC 4 CC EA  Final   Culture   Final    KLEBSIELLA PNEUMONIAE STREPTOCOCCUS SPECIES Note: IDENTIFIED AS  STREPTOCOCCUS ANGINOSUS Note: Gram Stain Report Called to,Read Back By and Verified With: CAROLYN RN ON 2W AT [id number] BY CASTC Performed at [facility]    Report Status PENDING  Incomplete  Blood culture (routine x 2)     Status: None (Preliminary result)   Collection Time: 10/16/14  4:22 PM  Result Value Ref Range Status   Specimen Description BLOOD RIGHT FOREARM  Final   Special Requests BOTTLES DRAWN AEROBIC AND ANAEROBIC 5ML  Final   Culture   Final    KLEBSIELLA PNEUMONIAE Note: Gram Stain Report Called to,Read Back By and Verified With: NICKY CARTER 10/17/14 1425 BY SMITHERSJ STREPTOCOCCUS SPECIES Note: Gram Stain Report Called to,Read Back By and Verified With: MARSHBURN WALTER B IDENTIFIED AS STREPTOCOCCUS ANGINOSUS Performed at [facility]    Report Status PENDING  Incomplete   Organism ID, Bacteria KLEBSIELLA PNEUMONIAE  Final      Susceptibility   Klebsiella pneumoniae - MIC*    AMPICILLIN >=32 RESISTANT Resistant     AMPICILLIN/SULBACTAM 4 SENSITIVE Sensitive     CEFAZOLIN <=4 SENSITIVE Sensitive     CEFEPIME <=1 SENSITIVE Sensitive     CEFTAZIDIME <=1 SENSITIVE Sensitive     CEFTRIAXONE <=1 SENSITIVE Sensitive     CIPROFLOXACIN <=0.25 SENSITIVE Sensitive     GENTAMICIN <=1 SENSITIVE Sensitive     IMIPENEM <=  0.25 SENSITIVE Sensitive     PIP/TAZO <=4 SENSITIVE Sensitive     TOBRAMYCIN <=1 SENSITIVE Sensitive     TRIMETH/SULFA <=20 SENSITIVE Sensitive     * KLEBSIELLA PNEUMONIAE  MRSA PCR Screening     Status: None   Collection Time: 10/16/14  6:20 PM  Result Value Ref Range Status   MRSA by PCR NEGATIVE NEGATIVE Final    Comment:        The GeneXpert MRSA Assay (FDA approved for NASAL specimens only), is one component of a comprehensive MRSA colonization surveillance program. It is not intended to diagnose MRSA infection nor to guide or monitor treatment for MRSA infections.     Studies/Results: No results  found.  Anti-infectives: Anti-infectives    Start     Dose/Rate Route Frequency Ordered Stop   10/17/14 0600  vancomycin (VANCOCIN) IVPB 750 mg/150 ml premix     750 mg150 mL/hr over 60 Minutes Intravenous Every 12 hours 10/16/14 1853     10/17/14 0100  piperacillin-tazobactam (ZOSYN) IVPB 3.375 g     3.375 g12.5 mL/hr over 240 Minutes Intravenous Every 8 hours 10/16/14 1853     10/16/14 1630  piperacillin-tazobactam (ZOSYN) IVPB 3.375 g     3.375 g100 mL/hr over 30 Minutes Intravenous  Once 10/16/14 1624 10/16/14 1723   10/16/14 1630  vancomycin (VANCOCIN) IVPB 1000 mg/200 mL premix     1,000 mg200 mL/hr over 60 Minutes Intravenous  Once 10/16/14 1624 10/16/14 1745      Assessment/Plan: Bacteremia with multiple organisms. 2 months status post Whipple. No obvious source on CT scan. Colon somewhat thickened, question colitis Appears stable. On IV antibiotics. Portal vein thrombosis, On prophylactic enoxaparin due to history of GI bleeding.  No apparent surgical issues currently.    LOS: 5 days    Sherisa Gilvin T 10/21/2014

## 2014-10-21 NOTE — Progress Notes (Signed)
[facility]  Date of Admission:  10/16/2014  Antibiotics: Vancomycin and zosyn day 6  Subjective: No acute events  Objective: Temp:  [98.1 F (36.7 C)-98.4 F (36.9 C)] 98.1 F (36.7 C) (01/31 0602) Pulse Rate:  [49-55] 50 (01/31 0602) Resp:  [16] 16 (01/31 0602) BP: (93-114)/(51-62) 113/54 mmHg (01/31 0602) SpO2:  [100 %] 100 % (01/31 0602)  General: alert, nad   Lab Results Lab Results  Component Value Date   WBC 3.8* 10/20/2014   HGB 8.0* 10/20/2014   HCT 25.5* 10/20/2014   MCV 91.1 10/20/2014   PLT 263 10/20/2014    Lab Results  Component Value Date   CREATININE 0.53 10/20/2014   BUN <5* 10/20/2014   NA 138 10/20/2014   K 4.7 10/20/2014   CL 113* 10/20/2014   CO2 21 10/20/2014    Lab Results  Component Value Date   ALT 15 10/18/2014   AST 18 10/18/2014   ALKPHOS 275* 10/18/2014   BILITOT 0.5 10/18/2014      Microbiology: Recent Results (from the past 240 hour(s))  Urine culture     Status: None   Collection Time: 10/16/14  3:35 PM  Result Value Ref Range Status   Specimen Description URINE, RANDOM  Final   Special Requests NONE  Final   Colony Count NO GROWTH Performed at [facility]   Final   Culture NO GROWTH Performed at [facility]   Final   Report Status 10/18/2014 FINAL  Final  Blood culture (routine x 2)     Status: None   Collection Time: 10/16/14  3:41 PM  Result Value Ref Range Status   Specimen Description BLOOD RIGHT HAND  Final   Special Requests BOTTLES DRAWN AEROBIC AND ANAEROBIC 4 CC EA  Final   Culture   Final    KLEBSIELLA PNEUMONIAE Note: SUSCEPTIBILITIES PERFORMED ON PREVIOUS CULTURE WITHIN THE LAST 5 DAYS. STREPTOCOCCUS SPECIES Note: IDENTIFIED AS STREPTOCOCCUS ANGINOSUS SUSCEPTIBILITIES PERFORMED ON PREVIOUS CULTURE WITHIN THE LAST 5 DAYS. Note: Gram Stain Report Called to,Read Back By and Verified With: CAROLYN RN ON 2W AT [phone] BY CASTC    Report Status  10/21/2014 FINAL  Final  Blood culture (routine x 2)     Status: None   Collection Time: 10/16/14  4:22 PM  Result Value Ref Range Status   Specimen Description BLOOD RIGHT FOREARM  Final   Special Requests BOTTLES DRAWN AEROBIC AND ANAEROBIC 5ML  Final   Culture   Final    KLEBSIELLA PNEUMONIAE Note: Gram Stain Report Called to,Read Back By and Verified With: NICKY CARTER 10/17/14 1425 BY SMITHERSJ STREPTOCOCCUS SPECIES Note: Gram Stain Report Called to,Read Back By and Verified With: MARSHBURN WALTER B IDENTIFIED AS STREPTOCOCCUS ANGINOSUS Performed at [facility]    Report Status 10/21/2014 FINAL  Final   Organism ID, Bacteria KLEBSIELLA PNEUMONIAE  Final   Organism ID, Bacteria STREPTOCOCCUS SPECIES  Final      Susceptibility   Klebsiella pneumoniae - MIC*    AMPICILLIN >=32 RESISTANT Resistant     AMPICILLIN/SULBACTAM 4 SENSITIVE Sensitive     CEFAZOLIN <=4 SENSITIVE Sensitive     CEFEPIME <=1 SENSITIVE Sensitive     CEFTAZIDIME <=1 SENSITIVE Sensitive     CEFTRIAXONE <=1 SENSITIVE Sensitive     CIPROFLOXACIN <=0.25 SENSITIVE Sensitive     GENTAMICIN <=1 SENSITIVE Sensitive     IMIPENEM <=0.25 SENSITIVE Sensitive     PIP/TAZO <=4 SENSITIVE Sensitive  TOBRAMYCIN <=1 SENSITIVE Sensitive     TRIMETH/SULFA <=20 SENSITIVE Sensitive     * KLEBSIELLA PNEUMONIAE   Streptococcus species - MIC (ETEST)*    PENICILLIN 0.125 SENSITIVE Sensitive     * STREPTOCOCCUS SPECIES  MRSA PCR Screening     Status: None   Collection Time: 10/16/14  6:20 PM  Result Value Ref Range Status   MRSA by PCR NEGATIVE NEGATIVE Final    Comment:        The GeneXpert MRSA Assay (FDA approved for NASAL specimens only), is one component of a comprehensive MRSA colonization surveillance program. It is not intended to diagnose MRSA infection nor to guide or monitor treatment for MRSA infections.     Studies/Results: No results found.  Assessment/Plan:  1) Bacteremia from  intra-abdominal source - ID as Klebsiella and Strep.  Will narrow antibiotics to unasyn based on sensitivities.   Dr. Baxter Flattery to follow up tomorrow.  thanks  Scharlene Gloss, [facility] [place] [email or web address] [phone] pager   [phone] cell 10/21/2014, 11:07 AM

## 2014-10-21 NOTE — Progress Notes (Signed)
Progress Note   Melinda Conrad ULA:[id number] DOB: 1951-04-25 DOA: 10/16/2014 PCP: Kevan Ny, MD   Brief Narrative:   Melinda Conrad is an 64 y.o. female with PMH of pancreatic cancer, status post radiation therapy, hypertension and history of substance abuse, recent prolonged admission (09/14/14-10/09/14) for Klebsiella sepsis/bacteremia, who was re-admitted 10/16/13 with high fever and worsening epigastric pain. On admission, lactic acid was normal. Blood pressure was low with systolics in the 21Y-24M.  Assessment/Plan:   Principal Problem:   Severe sepsis / sepsis associated hypotension / bacteremia  Continue empiric broad-spectrum antibiotics with vancomycin/Zosyn.  Urine cultures negative. Blood cultures growing Klebsiella and strep species, presumed GI source. ID following. Defer to them regarding appropriate antibiotic and duration of therapy.  Blood pressure remains stable status post aggressive IV fluids.  CT of the abdomen concerning for infectious versus inflammatory colitis and migration of pancreatic stent. Surgery following.  Active Problems:   Multifactorial anemia  Patient had recent GI bleeding. 2 g drop in hemoglobin noted 24 hours after admission, likely dilutional, but will continue to monitor closely.  Hemoglobin stable with no further drops.    Portal vein thrombosis  Has not been on therapeutic anticoagulation secondary to recent significant GI bleeding.    Pancreatic cancer  Status post Whipple procedure.    Protein-calorie malnutrition, severe  Continue nutritional supplements.    Hypokalemia  Replaced.    Hyponatremia  Improved with IV fluids.    Elevated alkaline phosphatase level  Has been elevated since 10/02/14.  Status post abdominal ultrasound which only shows known portal vein thrombosis.    DVT Prophylaxis  On Lovenox.  Code Status: Full. Family Communication: Husband, [name] updated at the bedside  10/19/14. Disposition Plan: Home when stable.   IV Access:    Peripheral IV   Procedures and diagnostic studies:   Dg Chest 2 View 10/16/2014: Areas of scattered interstitial fibrosis. Recent interstitial edema has cleared. Currently no edema or consolidation. No effusions.    Ct Abdomen Pelvis W Contrast 10/16/2014: Postsurgical changes related to Whipple procedure, as above. Please note that the prior pancreatic stent has now migrated into the small bowel.  Portal vein thrombosis. Suspected associated heterogeneous perfusion of the liver.  Soft tissue/nodularity along the hepatoduodenal ligament is favored to reflect radiation/postsurgical changes, although a discrete 11 mm gastrohepatic node is not excluded.  Mild wall thickening of the right colon, possibly reflecting infectious/inflammatory colitis.     US Abdomen Limited Ruq 10/16/2014: Postsurgical changes.  Thrombosis of the portal vein similar to that seen on recent CT examination.  Heterogeneous liver consistent with that seen on the recent CT examination likely related to the portal vein thrombosis.     Medical Consultants:    Dr. Michel Bickers, Infectious Disease  Dr. Stark Klein, Surgery  Anti-Infectives:    Vancomycin 10/16/14--->10/21/14  Zosyn 10/16/14--->10/21/14  Unasyn 10/21/14--->  Subjective:   Melinda Conrad still reports some abdominal pain, but feels better overall.  No nausea/vomiting.  Appetite OK.  No new complaints.  Bowels moving.  Objective:    Filed Vitals:   10/20/14 0547 10/20/14 1400 10/20/14 2151 10/21/14 0602  BP: 122/63 93/51 114/62 113/54  Pulse: 50 49 55 50  Temp: 98 F (36.7 C) 98.1 F (36.7 C) 98.4 F (36.9 C) 98.1 F (36.7 C)  TempSrc: Oral Oral Oral Oral  Resp: 16 16 16 16   Height:      Weight:      SpO2: 100% 100% 100% 100%  Intake/Output Summary (Last 24 hours) at 10/21/14 0908 Last data filed at 10/21/14 0600  Gross per 24 hour  Intake   3220 ml  Output    600 ml   Net   2620 ml    Exam: Gen:  NAD Cardiovascular:  RRR, No M/R/G Respiratory:  Lungs CTAB Gastrointestinal:  Abdomen soft, NT/ND, + BS Extremities:  No C/E/C   Data Reviewed:    Labs: Basic Metabolic Panel:  Recent Labs Lab 10/16/14 1339 10/17/14 0345 10/18/14 0410 10/19/14 0447 10/20/14 0500  NA 129* 134* 136 137 138  K 3.7 3.1* 3.9 4.2 4.7  CL 103 110 112 111 113*  CO2 18* 20 21 21 21   GLUCOSE 143* 76 66* 63* 59*  BUN 10 9 8  <5* <5*  CREATININE 0.60 0.44* 0.45* 0.49* 0.53  CALCIUM 8.0* 7.6* 7.8* 8.2* 8.1*   GFR Estimated Creatinine Clearance: 53.2 mL/min (by C-G formula based on Cr of 0.53). Liver Function Tests:  Recent Labs Lab 10/16/14 1339 10/18/14 0410  AST 35 18  ALT 24 15  ALKPHOS 455* 275*  BILITOT 0.6 0.5  PROT 9.0* 6.8  ALBUMIN 2.1* 1.4*    Recent Labs Lab 10/16/14 1339  LIPASE 14   CBC:  Recent Labs Lab 10/16/14 1339 10/17/14 0345 10/17/14 1916 10/18/14 0410 10/19/14 0447 10/20/14 0500  WBC 14.8* 7.2 5.6 4.6 4.9 3.8*  NEUTROABS 13.6*  --   --   --   --   --   HGB 9.6* 7.4* 8.0* 7.5* 8.1* 8.0*  HCT 30.1* 23.6* 25.6* 23.8* 25.9* 25.5*  MCV 90.7 90.1 91.1 90.2 90.2 91.1  PLT 233 219 241 249 296 263   Sepsis Labs:  Recent Labs Lab 10/16/14 1340  10/17/14 1916 10/18/14 0410 10/19/14 0447 10/20/14 0500  WBC  --   < > 5.6 4.6 4.9 3.8*  LATICACIDVEN 2  --   --   --   --   --   < > = values in this interval not displayed. Microbiology Recent Results (from the past 240 hour(s))  Urine culture     Status: None   Collection Time: 10/16/14  3:35 PM  Result Value Ref Range Status   Specimen Description URINE, RANDOM  Final   Special Requests NONE  Final   Colony Count NO GROWTH Performed at [facility]   Final   Culture NO GROWTH Performed at [facility]   Final   Report Status 10/18/2014 FINAL  Final  Blood culture (routine x 2)     Status: None (Preliminary result)   Collection Time: 10/16/14  3:41  PM  Result Value Ref Range Status   Specimen Description BLOOD RIGHT HAND  Final   Special Requests BOTTLES DRAWN AEROBIC AND ANAEROBIC 4 CC EA  Final   Culture   Final    KLEBSIELLA PNEUMONIAE STREPTOCOCCUS SPECIES Note: IDENTIFIED AS STREPTOCOCCUS ANGINOSUS Note: Gram Stain Report Called to,Read Back By and Verified With: CAROLYN RN ON 2W AT [id number] [id number] BY CASTC Performed at [facility]    Report Status PENDING  Incomplete  Blood culture (routine x 2)     Status: None (Preliminary result)   Collection Time: 10/16/14  4:22 PM  Result Value Ref Range Status   Specimen Description BLOOD RIGHT FOREARM  Final   Special Requests BOTTLES DRAWN AEROBIC AND ANAEROBIC 5ML  Final   Culture   Final    KLEBSIELLA PNEUMONIAE Note: Gram Stain Report Called to,Read Back By and Verified With:  NICKY CARTER 10/17/14 1425 BY SMITHERSJ STREPTOCOCCUS SPECIES Note: Gram Stain Report Called to,Read Back By and Verified With: MARSHBURN WALTER B IDENTIFIED AS STREPTOCOCCUS ANGINOSUS Performed at [facility]    Report Status PENDING  Incomplete   Organism ID, Bacteria KLEBSIELLA PNEUMONIAE  Final      Susceptibility   Klebsiella pneumoniae - MIC*    AMPICILLIN >=32 RESISTANT Resistant     AMPICILLIN/SULBACTAM 4 SENSITIVE Sensitive     CEFAZOLIN <=4 SENSITIVE Sensitive     CEFEPIME <=1 SENSITIVE Sensitive     CEFTAZIDIME <=1 SENSITIVE Sensitive     CEFTRIAXONE <=1 SENSITIVE Sensitive     CIPROFLOXACIN <=0.25 SENSITIVE Sensitive     GENTAMICIN <=1 SENSITIVE Sensitive     IMIPENEM <=0.25 SENSITIVE Sensitive     PIP/TAZO <=4 SENSITIVE Sensitive     TOBRAMYCIN <=1 SENSITIVE Sensitive     TRIMETH/SULFA <=20 SENSITIVE Sensitive     * KLEBSIELLA PNEUMONIAE  MRSA PCR Screening     Status: None   Collection Time: 10/16/14  6:20 PM  Result Value Ref Range Status   MRSA by PCR NEGATIVE NEGATIVE Final    Comment:        The GeneXpert MRSA Assay (FDA approved for NASAL specimens only), is  one component of a comprehensive MRSA colonization surveillance program. It is not intended to diagnose MRSA infection nor to guide or monitor treatment for MRSA infections.      Medications:   . enoxaparin (LOVENOX) injection  40 mg Subcutaneous Q24H  . feeding supplement (ENSURE COMPLETE)  237 mL Oral TID BM  . fentaNYL  25 mcg Transdermal Q72H  . piperacillin-tazobactam (ZOSYN)  IV  3.375 g Intravenous Q8H  . pregabalin  25 mg Oral QHS  . sertraline  100 mg Oral q morning - 10a  . sodium chloride  3 mL Intravenous Q12H  . sucralfate  1 g Oral TID WC & HS  . vancomycin  750 mg Intravenous Q12H   Continuous Infusions: . 0.9 % NaCl with KCl 40 mEq / L 75 mL/hr (10/21/14 0108)    Time spent: 25 minutes.    LOS: 5 days   [facility] Pager [phone]. If unable to reach me by pager, please call my cell phone at [phone].  *Please refer to [email or web address], password [email or web address] to get updated schedule on who will round on this patient, as hospitalists switch teams weekly. If 7PM-7AM, please contact night-coverage at [email or web address], password [email or web address] for any overnight needs.  10/21/2014, 9:08 AM

## 2014-10-22 ENCOUNTER — Ambulatory Visit: Payer: Medicaid Other | Admitting: Nurse Practitioner

## 2014-10-22 DIAGNOSIS — B954 Other streptococcus as the cause of diseases classified elsewhere: Secondary | ICD-10-CM

## 2014-10-22 DIAGNOSIS — A09 Infectious gastroenteritis and colitis, unspecified: Secondary | ICD-10-CM

## 2014-10-22 LAB — BASIC METABOLIC PANEL
ANION GAP: 3 — AB (ref 5–15)
CALCIUM: 8.1 mg/dL — AB (ref 8.4–10.5)
CHLORIDE: 112 mmol/L (ref 96–112)
CO2: 22 mmol/L (ref 19–32)
Creatinine, Ser: 0.58 mg/dL (ref 0.50–1.10)
GFR calc Af Amer: >90 mL/min (ref >90)
GLUCOSE: 70 mg/dL (ref 70–99)
POTASSIUM: 4.6 mmol/L (ref 3.5–5.1)
Sodium: 137 mmol/L (ref 135–145)

## 2014-10-22 LAB — CBC
HCT: 26 % — ABNORMAL LOW (ref 36.0–46.0)
Hemoglobin: 8.1 g/dL — ABNORMAL LOW (ref 12.0–15.0)
MCH: 28.4 pg (ref 26.0–34.0)
MCHC: 31.2 g/dL (ref 30.0–36.0)
MCV: 91.2 fL (ref 78.0–100.0)
Platelets: 301 10*3/uL (ref 150–400)
RBC: 2.85 MIL/uL — ABNORMAL LOW (ref 3.87–5.11)
RDW: 15.8 % — ABNORMAL HIGH (ref 11.5–15.5)
WBC: 3.9 10*3/uL — AB (ref 4.0–10.5)

## 2014-10-22 MED ORDER — SODIUM CHLORIDE 0.9 % IJ SOLN
10.0000 mL | INTRAMUSCULAR | Status: DC | PRN
  Administered 2014-10-23 (×2): 10 mL
  Filled 2014-10-22 (×2): qty 40

## 2014-10-22 NOTE — Progress Notes (Signed)
Progress Note   Melinda Conrad QHU:[id number] DOB: [birth year] DOA: 10/16/2014 PCP: Kevan Ny, MD   Brief Narrative:   Melinda Conrad is an 64 y.o. female with PMH of pancreatic cancer, status post radiation therapy, hypertension and history of substance abuse, recent prolonged admission (09/14/14-10/09/14) for Klebsiella sepsis/bacteremia, who was re-admitted 10/16/13 with high fever and worsening epigastric pain. On admission, lactic acid was normal. Blood pressure was low with systolics in the 46F-68L.  Assessment/Plan:   Principal Problem:   Severe sepsis / sepsis associated hypotension / bacteremia  Continue empiric broad-spectrum antibiotics with vancomycin/Zosyn.  Urine cultures negative. Blood cultures growing Klebsiella and strep species, presumed GI source. ID following. Defer to them regarding appropriate outpatient antibiotic and duration of therapy.  Blood pressure remains stable status post aggressive IV fluids.  CT of the abdomen concerning for infectious versus inflammatory colitis and migration of pancreatic stent. Surgery following.  Active Problems:   Multifactorial anemia  Patient had recent GI bleeding. 2 g drop in hemoglobin noted 24 hours after admission, likely dilutional, but will continue to monitor closely.  Hemoglobin stable with no further drops.    Portal vein thrombosis  Has not been on therapeutic anticoagulation secondary to recent significant GI bleeding.    Pancreatic cancer  Status post Whipple procedure.    Protein-calorie malnutrition, severe  Continue nutritional supplements.    Hypokalemia  Replaced.    Hyponatremia  Improved with IV fluids.    Elevated alkaline phosphatase level  Has been elevated since 10/02/14.  Status post abdominal ultrasound which only shows known portal vein thrombosis.    DVT Prophylaxis  On Lovenox.  Code Status: Full. Family Communication: Husband, Melinda Conrad updated at the bedside  10/19/14. Disposition Plan: Home when stable.   IV Access:    Peripheral IV   Procedures and diagnostic studies:   Dg Chest 2 View 10/16/2014: Areas of scattered interstitial fibrosis. Recent interstitial edema has cleared. Currently no edema or consolidation. No effusions.    Ct Abdomen Pelvis W Contrast 10/16/2014: Postsurgical changes related to Whipple procedure, as above. Please note that the prior pancreatic stent has now migrated into the small bowel.  Portal vein thrombosis. Suspected associated heterogeneous perfusion of the liver.  Soft tissue/nodularity along the hepatoduodenal ligament is favored to reflect radiation/postsurgical changes, although a discrete 11 mm gastrohepatic node is not excluded.  Mild wall thickening of the right colon, possibly reflecting infectious/inflammatory colitis.     US Abdomen Limited Ruq 10/16/2014: Postsurgical changes.  Thrombosis of the portal vein similar to that seen on recent CT examination.  Heterogeneous liver consistent with that seen on the recent CT examination likely related to the portal vein thrombosis.     Medical Consultants:    Dr. Michel Bickers, Infectious Disease  Dr. Stark Klein, Surgery  Anti-Infectives:    Vancomycin 10/16/14--->10/21/14  Zosyn 10/16/14--->10/21/14  Unasyn 10/21/14--->  Subjective:   Melinda Conrad still reports some abdominal pain 6/10 at present. No nausea/vomiting.  Appetite OK. Bowels moving.  Objective:    Filed Vitals:   10/21/14 0602 10/21/14 1400 10/21/14 2100 10/22/14 0557  BP: 113/54 111/65 130/54 121/54  Pulse: 50 56 52 59  Temp: 98.1 F (36.7 C) 97.7 F (36.5 C) 97.7 F (36.5 C) 98.6 F (37 C)  TempSrc: Oral Oral Oral Oral  Resp: 16 16 18 18   Height:      Weight:      SpO2: 100% 100% 100% 100%    Intake/Output Summary (Last  24 hours) at 10/22/14 0832 Last data filed at 10/22/14 0607  Gross per 24 hour  Intake   2500 ml  Output   1300 ml  Net   1200 ml     Exam: Gen:  NAD Cardiovascular:  RRR, No M/R/G Respiratory:  Lungs CTAB Gastrointestinal:  Abdomen soft, NT/ND, + BS Extremities:  No C/E/C   Data Reviewed:    Labs: Basic Metabolic Panel:  Recent Labs Lab 10/17/14 0345 10/18/14 0410 10/19/14 0447 10/20/14 0500 10/22/14 0537  NA 134* 136 137 138 137  K 3.1* 3.9 4.2 4.7 4.6  CL 110 112 111 113* 112  CO2 20 21 21 21 22   GLUCOSE 76 66* 63* 59* 70  BUN 9 8 <5* <5* <5*  CREATININE 0.44* 0.45* 0.49* 0.53 0.58  CALCIUM 7.6* 7.8* 8.2* 8.1* 8.1*   GFR Estimated Creatinine Clearance: 53.2 mL/min (by C-G formula based on Cr of 0.58). Liver Function Tests:  Recent Labs Lab 10/16/14 1339 10/18/14 0410  AST 35 18  ALT 24 15  ALKPHOS 455* 275*  BILITOT 0.6 0.5  PROT 9.0* 6.8  ALBUMIN 2.1* 1.4*    Recent Labs Lab 10/16/14 1339  LIPASE 14   CBC:  Recent Labs Lab 10/16/14 1339  10/17/14 1916 10/18/14 0410 10/19/14 0447 10/20/14 0500 10/22/14 0537  WBC 14.8*  < > 5.6 4.6 4.9 3.8* 3.9*  NEUTROABS 13.6*  --   --   --   --   --   --   HGB 9.6*  < > 8.0* 7.5* 8.1* 8.0* 8.1*  HCT 30.1*  < > 25.6* 23.8* 25.9* 25.5* 26.0*  MCV 90.7  < > 91.1 90.2 90.2 91.1 91.2  PLT 233  < > 241 249 296 263 301  < > = values in this interval not displayed. Sepsis Labs:  Recent Labs Lab 10/16/14 1340  10/18/14 0410 10/19/14 0447 10/20/14 0500 10/22/14 0537  WBC  --   < > 4.6 4.9 3.8* 3.9*  LATICACIDVEN 2  --   --   --   --   --   < > = values in this interval not displayed. Microbiology Recent Results (from the past 240 hour(s))  Urine culture     Status: None   Collection Time: 10/16/14  3:35 PM  Result Value Ref Range Status   Specimen Description URINE, RANDOM  Final   Special Requests NONE  Final   Colony Count NO GROWTH Performed at [facility]   Final   Culture NO GROWTH Performed at [facility]   Final   Report Status 10/18/2014 FINAL  Final  Blood culture (routine x 2)     Status: None    Collection Time: 10/16/14  3:41 PM  Result Value Ref Range Status   Specimen Description BLOOD RIGHT HAND  Final   Special Requests BOTTLES DRAWN AEROBIC AND ANAEROBIC 4 CC EA  Final   Culture   Final    KLEBSIELLA PNEUMONIAE Note: SUSCEPTIBILITIES PERFORMED ON PREVIOUS CULTURE WITHIN THE LAST 5 DAYS. STREPTOCOCCUS SPECIES Note: IDENTIFIED AS STREPTOCOCCUS ANGINOSUS SUSCEPTIBILITIES PERFORMED ON PREVIOUS CULTURE WITHIN THE LAST 5 DAYS. Note: Gram Stain Report Called to,Read Back By and Verified With: CAROLYN RN ON 2W AT [id number] BY CASTC    Report Status 10/21/2014 FINAL  Final  Blood culture (routine x 2)     Status: None   Collection Time: 10/16/14  4:22 PM  Result Value Ref Range Status   Specimen Description BLOOD RIGHT FOREARM  Final   Special Requests BOTTLES DRAWN AEROBIC AND ANAEROBIC 5ML  Final   Culture   Final    KLEBSIELLA PNEUMONIAE Note: Gram Stain Report Called to,Read Back By and Verified With: NICKY CARTER 10/17/14 1425 BY SMITHERSJ STREPTOCOCCUS SPECIES Note: Gram Stain Report Called to,Read Back By and Verified With: MARSHBURN WALTER B IDENTIFIED AS STREPTOCOCCUS ANGINOSUS Performed at [facility]    Report Status 10/21/2014 FINAL  Final   Organism ID, Bacteria KLEBSIELLA PNEUMONIAE  Final   Organism ID, Bacteria STREPTOCOCCUS SPECIES  Final      Susceptibility   Klebsiella pneumoniae - MIC*    AMPICILLIN >=32 RESISTANT Resistant     AMPICILLIN/SULBACTAM 4 SENSITIVE Sensitive     CEFAZOLIN <=4 SENSITIVE Sensitive     CEFEPIME <=1 SENSITIVE Sensitive     CEFTAZIDIME <=1 SENSITIVE Sensitive     CEFTRIAXONE <=1 SENSITIVE Sensitive     CIPROFLOXACIN <=0.25 SENSITIVE Sensitive     GENTAMICIN <=1 SENSITIVE Sensitive     IMIPENEM <=0.25 SENSITIVE Sensitive     PIP/TAZO <=4 SENSITIVE Sensitive     TOBRAMYCIN <=1 SENSITIVE Sensitive     TRIMETH/SULFA <=20 SENSITIVE Sensitive     * KLEBSIELLA PNEUMONIAE   Streptococcus species - MIC (ETEST)*     PENICILLIN 0.125 SENSITIVE Sensitive     * STREPTOCOCCUS SPECIES  MRSA PCR Screening     Status: None   Collection Time: 10/16/14  6:20 PM  Result Value Ref Range Status   MRSA by PCR NEGATIVE NEGATIVE Final    Comment:        The GeneXpert MRSA Assay (FDA approved for NASAL specimens only), is one component of a comprehensive MRSA colonization surveillance program. It is not intended to diagnose MRSA infection nor to guide or monitor treatment for MRSA infections.      Medications:   . ampicillin-sulbactam (UNASYN) IV  3 g Intravenous Q6H  . enoxaparin (LOVENOX) injection  40 mg Subcutaneous Q24H  . feeding supplement (ENSURE COMPLETE)  237 mL Oral TID BM  . fentaNYL  25 mcg Transdermal Q72H  . pregabalin  25 mg Oral QHS  . sertraline  100 mg Oral q morning - 10a  . sodium chloride  3 mL Intravenous Q12H  . sucralfate  1 g Oral TID WC & HS   Continuous Infusions: . 0.9 % NaCl with KCl 40 mEq / L 75 mL/hr (10/22/14 0540)    Time spent: 25 minutes.    LOS: 6 days   [facility] Pager [phone]. If unable to reach me by pager, please call my cell phone at [phone].  *Please refer to [email or web address], password [email or web address] to get updated schedule on who will round on this patient, as hospitalists switch teams weekly. If 7PM-7AM, please contact night-coverage at [email or web address], password [email or web address] for any overnight needs.  10/22/2014, 8:32 AM

## 2014-10-22 NOTE — Progress Notes (Signed)
Peripherally Inserted Central Catheter/Midline Placement  The IV Nurse has discussed with the patient and/or persons authorized to consent for the patient, the purpose of this procedure and the potential benefits and risks involved with this procedure.  The benefits include less needle sticks, lab draws from the catheter and patient may be discharged home with the catheter.  Risks include, but not limited to, infection, bleeding, blood clot (thrombus formation), and puncture of an artery; nerve damage and irregular heat beat.  Alternatives to this procedure were also discussed.  PICC/Midline Placement Documentation  PICC / Midline Single Lumen 97/35/32 PICC Right Basilic 38 cm 2 cm (Active)  Indication for Insertion or Continuance of Line Home intravenous therapies (PICC only) 10/22/2014  5:00 PM  Exposed Catheter (cm) 2 cm 10/22/2014  5:00 PM  Dressing Change Due 10/29/14 10/22/2014  5:00 PM       Jule Economy Horton 10/22/2014, 5:57 PM

## 2014-10-22 NOTE — Progress Notes (Signed)
[facility]  Date of Admission:  10/16/2014  Antibiotics: day 7 Amp/sub #2  Subjective: Afebrile, reports sleepy in the daytime due to inability to sleep at night  Objective: Temp:  [97.7 F (36.5 C)-98.6 F (37 C)] 98.6 F (37 C) (02/01 0557) Pulse Rate:  [52-59] 59 (02/01 0557) Resp:  [18] 18 (02/01 0557) BP: (121-130)/(54) 121/54 mmHg (02/01 0557) SpO2:  [100 %] 100 % (02/01 0557)  General: alert,  thin appearing female in NAD Heent= EOMI, PERRLA, no scleral icterus, moist mucous membranes Neck = supple Cors = nl s1,s2, no g/m/r Abd = surgical scar noted at midline. Mildly distended, non tender. BS+ Ext= no c/c/e   Lab Results Lab Results  Component Value Date   WBC 3.9* 10/22/2014   HGB 8.1* 10/22/2014   HCT 26.0* 10/22/2014   MCV 91.2 10/22/2014   PLT 301 10/22/2014    Lab Results  Component Value Date   CREATININE 0.58 10/22/2014   BUN <5* 10/22/2014   NA 137 10/22/2014   K 4.6 10/22/2014   CL 112 10/22/2014   CO2 22 10/22/2014    Lab Results  Component Value Date   ALT 15 10/18/2014   AST 18 10/18/2014   ALKPHOS 275* 10/18/2014   BILITOT 0.5 10/18/2014      Microbiology: Recent Results (from the past 240 hour(s))  Urine culture     Status: None   Collection Time: 10/16/14  3:35 PM  Result Value Ref Range Status   Specimen Description URINE, RANDOM  Final   Special Requests NONE  Final   Colony Count NO GROWTH Performed at [facility]   Final   Culture NO GROWTH Performed at [facility]   Final   Report Status 10/18/2014 FINAL  Final  Blood culture (routine x 2)     Status: None   Collection Time: 10/16/14  3:41 PM  Result Value Ref Range Status   Specimen Description BLOOD RIGHT HAND  Final   Special Requests BOTTLES DRAWN AEROBIC AND ANAEROBIC 4 CC EA  Final   Culture   Final    KLEBSIELLA PNEUMONIAE Note: SUSCEPTIBILITIES PERFORMED ON PREVIOUS CULTURE WITHIN THE LAST 5  DAYS. STREPTOCOCCUS SPECIES Note: IDENTIFIED AS STREPTOCOCCUS ANGINOSUS SUSCEPTIBILITIES PERFORMED ON PREVIOUS CULTURE WITHIN THE LAST 5 DAYS. Note: Gram Stain Report Called to,Read Back By and Verified With: CAROLYN RN ON 2W AT [phone] BY CASTC    Report Status 10/21/2014 FINAL  Final  Blood culture (routine x 2)     Status: None   Collection Time: 10/16/14  4:22 PM  Result Value Ref Range Status   Specimen Description BLOOD RIGHT FOREARM  Final   Special Requests BOTTLES DRAWN AEROBIC AND ANAEROBIC 5ML  Final   Culture   Final    KLEBSIELLA PNEUMONIAE Note: Gram Stain Report Called to,Read Back By and Verified With: NICKY CARTER 10/17/14 1425 BY SMITHERSJ STREPTOCOCCUS SPECIES Note: Gram Stain Report Called to,Read Back By and Verified With: MARSHBURN WALTER B IDENTIFIED AS STREPTOCOCCUS ANGINOSUS Performed at [facility]    Report Status 10/21/2014 FINAL  Final   Organism ID, Bacteria KLEBSIELLA PNEUMONIAE  Final   Organism ID, Bacteria STREPTOCOCCUS SPECIES  Final      Susceptibility   Klebsiella pneumoniae - MIC*    AMPICILLIN >=32 RESISTANT Resistant     AMPICILLIN/SULBACTAM 4 SENSITIVE Sensitive     CEFAZOLIN <=4 SENSITIVE Sensitive     CEFEPIME <=1 SENSITIVE Sensitive  CEFTAZIDIME <=1 SENSITIVE Sensitive     CEFTRIAXONE <=1 SENSITIVE Sensitive     CIPROFLOXACIN <=0.25 SENSITIVE Sensitive     GENTAMICIN <=1 SENSITIVE Sensitive     IMIPENEM <=0.25 SENSITIVE Sensitive     PIP/TAZO <=4 SENSITIVE Sensitive     TOBRAMYCIN <=1 SENSITIVE Sensitive     TRIMETH/SULFA <=20 SENSITIVE Sensitive     * KLEBSIELLA PNEUMONIAE   Streptococcus species - MIC (ETEST)*    PENICILLIN 0.125 SENSITIVE Sensitive     * STREPTOCOCCUS SPECIES  MRSA PCR Screening     Status: None   Collection Time: 10/16/14  6:20 PM  Result Value Ref Range Status   MRSA by PCR NEGATIVE NEGATIVE Final    Comment:        The GeneXpert MRSA Assay (FDA approved for NASAL specimens only), is one  component of a comprehensive MRSA colonization surveillance program. It is not intended to diagnose MRSA infection nor to guide or monitor treatment for MRSA infections.     Studies/Results: No results found.  Assessment/Plan:  1) polymicrobial bacteremia from intra-abdominal source - found to have streptococcus anginosus and kleb pneumonaie.(sens amp/sub). Would continue with amp/sub for a minimum of 2 wks, favoring 6 wks (for presumed septic PV thrombus).  No new intra-abdominal fluid collection found on CT scan. CT scan commented on colitis, which could explain translocation of bacteria. Other potential sources is that the patient had a new portal vein thrombosis at her  Previous hospitalization on 12/27 in the setting of klebsiella bacteremia.  - recommend to repeat blood cultures to document clearance - will need picc line to treat for 14 days using 1/27 as day 1 of 42. Currently day 6 of [place], [place], [place] for [place] [phone] pager   [phone] cell 10/22/2014, 2:02 PM

## 2014-10-23 DIAGNOSIS — B182 Chronic viral hepatitis C: Secondary | ICD-10-CM

## 2014-10-23 LAB — CREATININE, SERUM
Creatinine, Ser: 0.44 mg/dL — ABNORMAL LOW (ref 0.50–1.10)
GFR calc non Af Amer: >90 mL/min (ref >90)

## 2014-10-23 MED ORDER — PANTOPRAZOLE SODIUM 40 MG PO TBEC: 40.0000 mg | DELAYED_RELEASE_TABLET | Freq: Two times a day (BID) | ORAL | Status: DC | PRN

## 2014-10-23 MED ORDER — SODIUM CHLORIDE 0.9 % IV SOLN: 3.0000 g | Freq: Four times a day (QID) | INTRAVENOUS | Status: DC

## 2014-10-23 MED ORDER — HEPARIN SOD (PORK) LOCK FLUSH 100 UNIT/ML IV SOLN
250.0000 [IU] | INTRAVENOUS | Status: AC | PRN
  Administered 2014-10-23: 250 [IU]

## 2014-10-23 NOTE — Care Management Note (Signed)
    Page 1 of 1   10/23/2014     11:00:51 AM CARE MANAGEMENT NOTE 10/23/2014  Patient:  Melinda Conrad, Melinda Conrad   Account Number:  [id number]  Date Initiated:  10/23/2014  Documentation initiated by:  Sunday Spillers  Subjective/Objective Assessment:   64 yo female admitted with bacteremia. PTA lived at home alone.     Action/Plan:   Home when stable   Anticipated DC Date:  10/23/2014   Anticipated DC Plan:  [place]  CM consult      [facility] Choice  Resumption Of Svcs/PTA Provider   Choice offered to / List presented to:  C-1 Patient        [place] arranged  Remy RN      [place].   Status of service:  Completed, signed off Medicare Important Message given?   (If response is "NO", the following Medicare IM given date fields will be blank) Date Medicare IM given:   Medicare IM given by:   Date Additional Medicare IM given:   Additional Medicare IM given by:    Discharge Disposition:  [place]  Per UR Regulation:  Reviewed for med. necessity/level of care/duration of stay  If discussed at [place] of Stay Meetings, dates discussed:    Comments:  10-23-14 Sunday Spillers RN CM 46 Spoke with patient at bedside regarding need for home abx, patient is trying to reach daughter for teaching and to take her home. Contacted [facility] to notify of needs, orders placed by attending, PICC in place. [facility] to come around 12n to do teaching.

## 2014-10-23 NOTE — Discharge Instructions (Signed)
Bacteremia °Bacteremia occurs when bacteria get in your blood. Normal blood does not usually have bacteria. Bacteremia is one way infections can spread from one part of the body to another. °CAUSES  °· Causes may include anything that allows bacteria to get into the body. Examples are: °¨ Catheters. °¨ Intravenous (IV) access tubes. °¨ Cuts or scrapes of the skin. °· Temporary bacteremia may occur during dental procedures, while brushing your teeth, or during a bowel movement. This rarely causes any symptoms or medical problems. °· Bacteria may also get in the bloodstream as a complication of a bacterial infection elsewhere. This includes infected wounds and bacterial infections of the: °¨ Lungs (pneumonia). °¨ Kidneys (pyelonephritis). °¨ Intestines (enteritis, colitis). °¨ Organs in the abdomen (appendicitis, cholecystitis, diverticulitis). °SYMPTOMS  °The body is usually able to clear small numbers of bacteria out of the blood quickly. Brief bacteremia usually does not cause problems.  °· Problems can occur if the bacteria start to grow in number or spread to other parts of the body. If the bacteria start growing, you may develop: °¨ Chills. °¨ Fever. °¨ Nausea. °¨ Vomiting. °¨ Sweating. °¨ Lightheadedness and low blood pressure. °¨ Pain. °· If bacteria start to grow in the linings around the brain, it is called meningitis. This can cause severe headaches, many other problems, and even death. °· If bacteria start to grow in a joint, it causes arthritis with painful joints. If bacteria start to grow in a bone, it is called osteomyelitis. °· Bacteria from the blood can also cause sores (abscesses) in many organs, such as the muscle, liver, spleen, lungs, brain, and kidneys. °DIAGNOSIS  °· This condition is diagnosed by cultures of the blood. °· Cultures may also be taken from other parts of the body that are thought to be causing the bacteremia. A small piece of tissue, fluid, or other product of the body is  sampled. The sample is then put on a growth plate to see if any bacteria grows. °· Other lab tests may be done and the results may be abnormal. °TREATMENT  °Treatment requires a stay in the hospital. You will be given antibiotic medicine through an IV access tube. °PREVENTION  °People with an increased risk of developing bacteremia or complications may be given antibiotics before certain procedures. Examples are: °· A person with a heart murmur or artificial heart valve, before having his or her teeth cleaned. °· Before having a surgical or other invasive procedure. °· Before having a bowel procedure. °Document Released: 06/21/2006 Document Revised: 11/30/2011 Document Reviewed: 04/02/2011 °ExitCare® Patient Information ©2015 [employer]. This information is not intended to replace advice given to you by your health care provider. Make sure you discuss any questions you have with your health care provider. ° °

## 2014-10-23 NOTE — Progress Notes (Signed)
Patient is alert and oriented, discharged home with [place], prescriptions given to patient, patient to follow up with MD Neta Mends RN 10-23-2014 17:01pm

## 2014-10-23 NOTE — Discharge Summary (Signed)
Physician Discharge Summary  Melinda Conrad MWN:[id number] DOB: October 31, 1950 DOA: 10/16/2014  PCP: Kevan Ny, MD  Admit date: 10/16/2014 Discharge date: 10/23/2014   Recommendations for Outpatient Follow-Up:   1. Please F/U on surveillance blood cultures drawn 10/22/14.   Discharge Diagnosis:   Principal Problem:    Severe Sepsis with Recurrent Klebsiella bacteremia Active Problems:    Pancreatic cancer    Depression    Protein-calorie malnutrition, severe    Portal vein thrombosis    Anemia in neoplastic disease    Hepatitis C without hepatic coma    Abdominal pain    Blood poisoning   Discharge Condition: Improved.  Diet recommendation:  Regular.   History of Present Illness:   Melinda Conrad is an 64 y.o. female with PMH of pancreatic cancer, status post radiation therapy, hypertension and history of substance abuse, recent prolonged admission (09/14/14-10/09/14) for Klebsiella sepsis/bacteremia, who was re-admitted 10/16/13 with high fever and worsening epigastric pain. On admission, lactic acid was normal. Blood pressure was low with systolics in the 40H-47Q.   Hospital Course by Problem:   Principal Problem:  Severe sepsis / sepsis associated hypotension / Klebseilla bacteremia  Initially treated with broad-spectrum antibiotics with vancomycin/Zosyn x 6 days.  Changed to Unasyn 10/22/14 per ID recommendations with recommendations of a total treatment course of 14 days of IV antibiotics.  Urine cultures negative. Blood cultures growing Klebsiella and strep species, presumed GI source. I  Blood pressure remains stable status post aggressive IV fluids.  CT of the abdomen concerning for infectious versus inflammatory colitis and migration of pancreatic stent. Surgery followed the patient's clinical course while in the hospital.  Active Problems:  Multifactorial anemia  Patient had recent GI bleeding. 2 g drop in hemoglobin noted 24 hours after admission,  likely dilutional. Hemoglobin stable with no further drops.   Portal vein thrombosis  Has not been on therapeutic anticoagulation secondary to recent significant GI bleeding.   Pancreatic cancer  Status post Whipple procedure.  F/U with oncology post discharge.   Protein-calorie malnutrition, severe  Continue nutritional supplements.   Hypokalemia  Replaced.   Hyponatremia  Improved with IV fluids.   Elevated alkaline phosphatase level  Has been elevated since 10/02/14.  Status post abdominal ultrasound which only shows known portal vein thrombosis.    Medical Consultants:    Dr. Michel Bickers, Infectious Disease  Dr. Stark Klein, Surgery   Discharge Exam:   Filed Vitals:   10/23/14 1237  BP: 118/66  Pulse: 61  Temp: 98.1 F (36.7 C)  Resp: 16   Filed Vitals:   10/22/14 2046 10/22/14 2102 10/23/14 0516 10/23/14 1237  BP: 126/64 136/68 123/70 118/66  Pulse: 61 60 89 61  Temp: 98.6 F (37 C) 98.5 F (36.9 C) 98.6 F (37 C) 98.1 F (36.7 C)  TempSrc: Oral Oral Oral Oral  Resp: 16 16 14 16   Height:      Weight:      SpO2: 100% 100% 100% 100%    Gen:  NAD, thin Cardiovascular:  RRR, No M/R/G Respiratory: Lungs CTAB Gastrointestinal: Abdomen soft, NT/ND with normal active bowel sounds. Extremities: No C/E/C   The results of significant diagnostics from this hospitalization (including imaging, microbiology, ancillary and laboratory) are listed below for reference.     Procedures and Diagnostic Studies:   Dg Chest 2 View 10/16/2014: Areas of scattered interstitial fibrosis. Recent interstitial edema has cleared. Currently no edema or consolidation. No effusions.   Ct Abdomen Pelvis W Contrast  10/16/2014: Postsurgical changes related to Whipple procedure, as above. Please note that the prior pancreatic stent has now migrated into the small bowel. Portal vein thrombosis. Suspected associated heterogeneous perfusion of the liver. Soft  tissue/nodularity along the hepatoduodenal ligament is favored to reflect radiation/postsurgical changes, although a discrete 11 mm gastrohepatic node is not excluded. Mild wall thickening of the right colon, possibly reflecting infectious/inflammatory colitis.   US Abdomen Limited Ruq 10/16/2014: Postsurgical changes. Thrombosis of the portal vein similar to that seen on recent CT examination. Heterogeneous liver consistent with that seen on the recent CT examination likely related to the portal vein thrombosis.   PICC line placed 10/22/14   Labs:   Basic Metabolic Panel:  Recent Labs Lab 10/17/14 0345 10/18/14 0410 10/19/14 0447 10/20/14 0500 10/22/14 0537 10/23/14 0408  NA 134* 136 137 138 137  --   K 3.1* 3.9 4.2 4.7 4.6  --   CL 110 112 111 113* 112  --   CO2 20 21 21 21 22   --   GLUCOSE 76 66* 63* 59* 70  --   BUN 9 8 <5* <5* <5*  --   CREATININE 0.44* 0.45* 0.49* 0.53 0.58 0.44*  CALCIUM 7.6* 7.8* 8.2* 8.1* 8.1*  --    GFR Estimated Creatinine Clearance: 53.2 mL/min (by C-G formula based on Cr of 0.44). Liver Function Tests:  Recent Labs Lab 10/18/14 0410  AST 18  ALT 15  ALKPHOS 275*  BILITOT 0.5  PROT 6.8  ALBUMIN 1.4*   CBC:  Recent Labs Lab 10/17/14 1916 10/18/14 0410 10/19/14 0447 10/20/14 0500 10/22/14 0537  WBC 5.6 4.6 4.9 3.8* 3.9*  HGB 8.0* 7.5* 8.1* 8.0* 8.1*  HCT 25.6* 23.8* 25.9* 25.5* 26.0*  MCV 91.1 90.2 90.2 91.1 91.2  PLT 241 249 296 263 301   Microbiology Recent Results (from the past 240 hour(s))  Urine culture     Status: None   Collection Time: 10/16/14  3:35 PM  Result Value Ref Range Status   Specimen Description URINE, RANDOM  Final   Special Requests NONE  Final   Colony Count NO GROWTH Performed at [facility]   Final   Culture NO GROWTH Performed at [facility]   Final   Report Status 10/18/2014 FINAL  Final  Blood culture (routine x 2)     Status: None   Collection Time: 10/16/14  3:41 PM    Result Value Ref Range Status   Specimen Description BLOOD RIGHT HAND  Final   Special Requests BOTTLES DRAWN AEROBIC AND ANAEROBIC 4 CC EA  Final   Culture   Final    KLEBSIELLA PNEUMONIAE Note: SUSCEPTIBILITIES PERFORMED ON PREVIOUS CULTURE WITHIN THE LAST 5 DAYS. STREPTOCOCCUS SPECIES Note: IDENTIFIED AS STREPTOCOCCUS ANGINOSUS SUSCEPTIBILITIES PERFORMED ON PREVIOUS CULTURE WITHIN THE LAST 5 DAYS. Note: Gram Stain Report Called to,Read Back By and Verified With: CAROLYN RN ON 2W AT [phone] BY CASTC    Report Status 10/21/2014 FINAL  Final  Blood culture (routine x 2)     Status: None   Collection Time: 10/16/14  4:22 PM  Result Value Ref Range Status   Specimen Description BLOOD RIGHT FOREARM  Final   Special Requests BOTTLES DRAWN AEROBIC AND ANAEROBIC 5ML  Final   Culture   Final    KLEBSIELLA PNEUMONIAE Note: Gram Stain Report Called to,Read Back By and Verified With: NICKY CARTER 10/17/14 1425 BY SMITHERSJ STREPTOCOCCUS SPECIES Note: Gram Stain Report Called to,Read Back By and Verified With:  Ingenio B IDENTIFIED AS STREPTOCOCCUS ANGINOSUS Performed at [facility]    Report Status 10/21/2014 FINAL  Final   Organism ID, Bacteria KLEBSIELLA PNEUMONIAE  Final   Organism ID, Bacteria STREPTOCOCCUS SPECIES  Final      Susceptibility   Klebsiella pneumoniae - MIC*    AMPICILLIN >=32 RESISTANT Resistant     AMPICILLIN/SULBACTAM 4 SENSITIVE Sensitive     CEFAZOLIN <=4 SENSITIVE Sensitive     CEFEPIME <=1 SENSITIVE Sensitive     CEFTAZIDIME <=1 SENSITIVE Sensitive     CEFTRIAXONE <=1 SENSITIVE Sensitive     CIPROFLOXACIN <=0.25 SENSITIVE Sensitive     GENTAMICIN <=1 SENSITIVE Sensitive     IMIPENEM <=0.25 SENSITIVE Sensitive     PIP/TAZO <=4 SENSITIVE Sensitive     TOBRAMYCIN <=1 SENSITIVE Sensitive     TRIMETH/SULFA <=20 SENSITIVE Sensitive     * KLEBSIELLA PNEUMONIAE   Streptococcus species - MIC (ETEST)*    PENICILLIN 0.125 SENSITIVE Sensitive     *  STREPTOCOCCUS SPECIES  MRSA PCR Screening     Status: None   Collection Time: 10/16/14  6:20 PM  Result Value Ref Range Status   MRSA by PCR NEGATIVE NEGATIVE Final    Comment:        The GeneXpert MRSA Assay (FDA approved for NASAL specimens only), is one component of a comprehensive MRSA colonization surveillance program. It is not intended to diagnose MRSA infection nor to guide or monitor treatment for MRSA infections.   Culture, blood (routine x 2)     Status: None (Preliminary result)   Collection Time: 10/22/14  4:00 PM  Result Value Ref Range Status   Specimen Description BLOOD LEFT ARM  Final   Special Requests BOTTLES DRAWN AEROBIC ONLY 5CC  Final   Culture   Final           BLOOD CULTURE RECEIVED NO GROWTH TO DATE CULTURE WILL BE HELD FOR 5 DAYS BEFORE ISSUING A FINAL NEGATIVE REPORT Performed at [facility]    Report Status PENDING  Incomplete  Culture, blood (routine x 2)     Status: None (Preliminary result)   Collection Time: 10/22/14  4:05 PM  Result Value Ref Range Status   Specimen Description BLOOD LEFT HAND  Final   Special Requests BOTTLES DRAWN AEROBIC ONLY 5CC  Final   Culture   Final           BLOOD CULTURE RECEIVED NO GROWTH TO DATE CULTURE WILL BE HELD FOR 5 DAYS BEFORE ISSUING A FINAL NEGATIVE REPORT Performed at [facility]    Report Status PENDING  Incomplete     Discharge Instructions:   Discharge Instructions    Call MD for:  extreme fatigue    Complete by:  As directed      Call MD for:  persistant nausea and vomiting    Complete by:  As directed      Call MD for:  severe uncontrolled pain    Complete by:  As directed      Call MD for:  temperature >100.4    Complete by:  As directed      Diet general    Complete by:  As directed      Discharge instructions    Complete by:  As directed   A home health care nurse will teach you how to use your PICC line to administer your antibiotics every 6 hours.  You were cared  for by Dr. Margreta Journey Malcolm Quast  (  a hospitalist) during your hospital stay. If you have any questions about your discharge medications or the care you received while you were in the hospital after you are discharged, you can call the unit and ask to speak with the hospitalist on call if the hospitalist that took care of you is not available. Once you are discharged, your primary care physician will handle any further medical issues. Please note that NO REFILLS for any discharge medications will be authorized once you are discharged, as it is imperative that you return to your primary care physician (or establish a relationship with a primary care physician if you do not have one) for your aftercare needs so that they can reassess your need for medications and monitor your lab values.  Any outstanding tests can be reviewed by your PCP at your follow up visit.  It is also important to review any medicine changes with your PCP.  Please bring these d/c instructions with you to your next visit so your physician can review these changes with you.  If you do not have a primary care physician, you can call [phone] for a physician referral.  It is highly recommended that you obtain a PCP for hospital follow up.     Face-to-face encounter (required for Medicare/Medicaid patients)    Complete by:  As directed   I Nevae Pinnix certify that this patient is under my care and that I, or a nurse practitioner or physician's assistant working with me, had a face-to-face encounter that meets the physician face-to-face encounter requirements with this patient on 10/23/2014. The encounter with the patient was in whole, or in part for the following medical condition(s) which is the primary reason for home health care (List medical condition): Bacteremia/sespis requiring prolonged IV antibiotics.  Deconditioning post prolonged recent hospitalization following a Whipples.  The encounter with the patient was in whole, or in part, for the  following medical condition, which is the primary reason for home health care:  Bacteremia/sepsis  I certify that, based on my findings, the following services are medically necessary home health services:   Nursing Physical therapy    Reason for Medically Necessary Home Health Services:   Skilled Nursing- Changes in Medication/Medication Management Skilled Nursing- Skilled Assessment/Observation Skilled Nursing- Assessment and Observation of Wound Status Skilled Nursing- Assessment and Training for Infusion Therapy, Line Care, and Infection Control Therapy- Therapeutic Exercises to Increase Strength and Endurance    My clinical findings support the need for the above services:  Unable to leave home safely without assistance and/or assistive device  Further, I certify that my clinical findings support that this patient is homebound due to:  Unable to leave home safely without assistance     Home Health    Complete by:  As directed   To provide the following care/treatments:   PT RN       Increase activity slowly    Complete by:  As directed             Medication List    STOP taking these medications        potassium chloride SA 20 MEQ tablet  Commonly known as:  K-DUR,KLOR-CON      TAKE these medications        Ampicillin-Sulbactam 3 g in sodium chloride 0.9 % 100 mL  Inject 3 g into the vein every 6 (six) hours.     antiseptic oral rinse 0.05 % Liqd solution  Commonly known as:  CPC / CETYLPYRIDINIUM CHLORIDE  0.05%  7 mLs by Mouth Rinse route 2 (two) times daily.     CALCIUM-VITAMIN D PO  Take 1 capsule by mouth daily.     feeding supplement (ENSURE COMPLETE) Liqd  Take 237 mLs by mouth 3 (three) times daily between meals.     fentaNYL 25 MCG/HR patch  Commonly known as:  DURAGESIC - dosed mcg/hr  Place 1 patch (25 mcg total) onto the skin every 3 (three) days.     ondansetron 4 MG tablet  Commonly known as:  ZOFRAN  Take 1 tablet (4 mg total) by mouth every 8  (eight) hours as needed for nausea or vomiting.     oxyCODONE-acetaminophen 5-325 MG per tablet  Commonly known as:  PERCOCET/ROXICET  Take 1-2 tablets by mouth every 4 (four) hours as needed for moderate pain.     pantoprazole 40 MG tablet  Commonly known as:  PROTONIX  Take 1 tablet (40 mg total) by mouth 2 (two) times daily as needed (heartburn).     pregabalin 25 MG capsule  Commonly known as:  LYRICA  Take 1 capsule (25 mg total) by mouth at bedtime.     prochlorperazine 5 MG tablet  Commonly known as:  COMPAZINE  TAKE ONE TO TWO TABS EVERY SIX HOURS PRN.     sertraline 100 MG tablet  Commonly known as:  ZOLOFT  Take 100 mg by mouth every morning.     sucralfate 1 GM/10ML suspension  Commonly known as:  CARAFATE  Take 10 mLs (1 g total) by mouth 4 (four) times daily -  with meals and at bedtime.          Time coordinating discharge: 35 minutes.  Signed:  Clarece Drzewiecki  Pager [phone] [facility] 10/23/2014, 2:00 PM

## 2014-10-24 ENCOUNTER — Other Ambulatory Visit: Payer: Self-pay | Admitting: *Deleted

## 2014-10-24 DIAGNOSIS — R1084 Generalized abdominal pain: Secondary | ICD-10-CM | POA: Insufficient documentation

## 2014-10-25 ENCOUNTER — Telehealth: Payer: Self-pay | Admitting: Nurse Practitioner

## 2014-10-25 NOTE — Telephone Encounter (Signed)
Confirmed appointment for February.

## 2014-10-28 LAB — CULTURE, BLOOD (ROUTINE X 2)
CULTURE: NO GROWTH
Culture: NO GROWTH

## 2014-10-29 ENCOUNTER — Telehealth: Payer: Self-pay | Admitting: Nurse Practitioner

## 2014-10-29 ENCOUNTER — Telehealth: Payer: Self-pay

## 2014-10-29 ENCOUNTER — Other Ambulatory Visit: Payer: Self-pay

## 2014-10-29 ENCOUNTER — Ambulatory Visit (HOSPITAL_BASED_OUTPATIENT_CLINIC_OR_DEPARTMENT_OTHER): Payer: Medicaid Other | Admitting: Nurse Practitioner

## 2014-10-29 VITALS — BP 154/93 | HR 100 | Temp 98.7°F | Resp 18 | Ht 63.0 in | Wt 111.1 lb

## 2014-10-29 DIAGNOSIS — C259 Malignant neoplasm of pancreas, unspecified: Secondary | ICD-10-CM

## 2014-10-29 DIAGNOSIS — R630 Anorexia: Secondary | ICD-10-CM

## 2014-10-29 DIAGNOSIS — G893 Neoplasm related pain (acute) (chronic): Secondary | ICD-10-CM

## 2014-10-29 DIAGNOSIS — R188 Other ascites: Secondary | ICD-10-CM

## 2014-10-29 DIAGNOSIS — K5909 Other constipation: Secondary | ICD-10-CM

## 2014-10-29 DIAGNOSIS — C250 Malignant neoplasm of head of pancreas: Secondary | ICD-10-CM

## 2014-10-29 MED ORDER — MEGESTROL ACETATE 40 MG/ML PO SUSP: 200.0000 mg | Freq: Two times a day (BID) | ORAL | Status: DC

## 2014-10-29 MED ORDER — OXYCODONE-ACETAMINOPHEN 5-325 MG PO TABS: 1.0000 | ORAL_TABLET | ORAL | Status: DC | PRN

## 2014-10-29 MED ORDER — MEGESTROL ACETATE 40 MG/ML PO SUSP
200.0000 mg | Freq: Two times a day (BID) | ORAL | Status: DC
Start: 2014-10-29 — End: 2014-10-29

## 2014-10-29 NOTE — Telephone Encounter (Signed)
Called [facility] for labs that were drawn on pt today to be faxed to our office. Spoke with Nira Conn, labs were not back today but when she received them she would have the nurse fax them over. Informed Ned Card, NP.

## 2014-10-29 NOTE — Progress Notes (Addendum)
[facility] OFFICE PROGRESS NOTE   Diagnosis:  Pancreas cancer  INTERVAL HISTORY:   Melinda Conrad returns for follow-up. She was hospitalized 09/14/2014 through 10/09/2014 with Klebsiella sepsis and a gastrointestinal bleed. CT abdomen/pelvis on 09/16/2014 showed a new portal vein thrombus. Anticoagulation was initiated and then discontinued due to continued gastrointestinal bleeding. She developed thrombocytopenia likely secondary to sepsis with possible DIC and consumption.  She was rehospitalized on 10/16/2014 with fever/sepsis with recurrent Klebsiella bacteremia. GI source presumed. She was followed by infectious disease. She was discharged home on 10/23/2014 on IV antibiotics.  She denies any fever since discharge from the hospital. Her appetite is poor. She would like an appetite stimulant. She had some nausea last night and vomited 2 times. Bowels are moving. No diarrhea. She denies any bleeding. She feels weak. She states that she has "good days and bad days". She continues to have abdominal pain which she reports is the same since surgery. She takes one Percocet as needed with partial relief. She feels her abdomen is more distended.   Objective:  Vital signs in last 24 hours:  Blood pressure 154/93, pulse 100, temperature 98.7 F (37.1 C), temperature source Oral, resp. rate 18, height 5\' 3"  (1.6 m), weight 111 lb 1.6 oz (50.395 kg), SpO2 99 %.    HEENT: No thrush or ulcers. Resp: Lungs clear bilaterally. Cardio: Regular rate and rhythm. GI: Abdomen is distended with generalized tenderness. Positive ascites. Vascular: No leg edema.    Lab Results:  Lab Results  Component Value Date   WBC 3.9* 10/22/2014   HGB 8.1* 10/22/2014   HCT 26.0* 10/22/2014   MCV 91.2 10/22/2014   PLT 301 10/22/2014   NEUTROABS 13.6* 10/16/2014    Imaging:  No results found.  Medications: I have reviewed the patient's current  medications.  Assessment/Plan: 1. 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  Pancreaticoduodenectomy 08/07/2014 confirmed a pathologic ypT3,ypN1 tumor with negative surgical margins 2. constipation-likely secondary to narcotic analgesics, improved  3. anorexia/weight loss  4. tobacco and alcohol use  5. history of a colon polyp in February 2015  6. depression 7. Pain secondary to pancreas cancer-improved 8. Klebsiella bacteremia 09/14/2014; readmitted 10/16/2014 with recurrent Klebsiella bacteremia 9. Respiratory failure/bilateral airspace disease 10. Acute portal vein thrombosis 09/16/2014. Not on anticoagulation due to gastrointestinal bleed. 11. Acute thrombocytopenia during hospitalization 09/14/2014. Resolved. 12. GI bleeding 09/14/2014 13. Hepatitis C positive   Disposition: Melinda Conrad is slowly recovering from the Whipple procedure and 2 hospitalizations with Klebsiella sepsis. She is completing IV antibiotics. She also had a GI bleed and acute portal vein thrombus. She has a poor performance status. Dr. Benay Spice does not recommend adjuvant chemotherapy given her performance status as well as the repeated episodes of sepsis.  For the anorexia she would like to try an appetite stimulant. We gave her a prescription for Megace. We discussed the increased risk of blood clots associated with Megace.  On exam today her abdomen appeared distended consistent with ascites. She understands to contact the office with further distention and we will  refer her for a therapeutic/diagnostic paracentesis.  For the abdominal pain she will try increasing the Percocet to 2 tablets every 4-6 hours as needed. She will contact the office if this is not effective.  We scheduled a return visit with labs in one month. She will contact the office in the interim as outlined above or with any other problems.   Patient seen with Dr. Benay Spice. 25 minutes were spent face-to-face at today's visit with the majority of that time involved in counseling/coordination of care.   Ned Card ANP/GNP-BC   10/29/2014  4:33 PM  This was a shared visit with Ned Card.  Melinda Conrad was interviewed and examined.  She continues to have a poor performance status after the repeat admissions with Gi bleeding and sepsis.  She is not a candidate for chemotherapy.  She may be developing progressive ascites.  Julieanne Manson, MD

## 2014-10-29 NOTE — Telephone Encounter (Signed)
Pt confirmed labs/ov per 02/08 POF, gave pt AVS.... KJ °

## 2014-11-05 ENCOUNTER — Telehealth: Payer: Self-pay | Admitting: *Deleted

## 2014-11-05 NOTE — Telephone Encounter (Signed)
Per Dr. Benay Spice; left voice message with Eustaquio Maize, RN from [facility] to contact Dr. Barry Dienes re: heartburn and paracentesis; she is managing this patient.  Any questions, please call office back.

## 2014-11-05 NOTE — Telephone Encounter (Signed)
Received voice message from [place], [place], with [facility] regarding patient Melinda Conrad. Beth stated," patient is having increased heartburn but I don't see anything on her medication list for it. What about Protonix? Also, "her abdomen if pretty firm. Her weight has increased, however, she has been eating more too. Please ask Dr. Benay Spice if she needs a paracentesis?" My return number is [phone].

## 2014-11-06 ENCOUNTER — Other Ambulatory Visit (INDEPENDENT_AMBULATORY_CARE_PROVIDER_SITE_OTHER): Payer: Self-pay | Admitting: General Surgery

## 2014-11-06 ENCOUNTER — Other Ambulatory Visit (INDEPENDENT_AMBULATORY_CARE_PROVIDER_SITE_OTHER): Payer: Self-pay | Admitting: *Deleted

## 2014-11-06 DIAGNOSIS — R188 Other ascites: Secondary | ICD-10-CM

## 2014-11-06 NOTE — Addendum Note (Signed)
Addended by: Stark Klein on: 11/06/2014 01:58 PM   Modules accepted: Orders

## 2014-11-12 ENCOUNTER — Telehealth: Payer: Self-pay | Admitting: *Deleted

## 2014-11-12 ENCOUNTER — Encounter: Payer: Self-pay | Admitting: *Deleted

## 2014-11-12 NOTE — Telephone Encounter (Signed)
Hepatic Function Panel Bilirubin, total =   2.6 H Bilirubin, direct =   2.0 H Indirect Bilirubin =  0.6 Alkalilne phosphatase -= 1337 H (result repeated and verified) AST/SGOT =    104 H ALT/SGPT =  `  40 H Total protein =   7.5  Albumin =    1.6 L  BUN =    9  Creatinine =    0.53  Verbal Order Per Dr. Baxter Flattery  - Stop IV antibiotics.  Phone cal to [place] [facility].  [facility] to stop IV ABX, verbalized back.   [facility] pharmacy will wait for further orders regarding medications and PICC line.

## 2014-11-12 NOTE — Progress Notes (Signed)
11/12/14 @ 12:35 pm:  Receive a voicemail from [place] at [facility] about this patient.  She is concerned because the patient has not been eating, has had increase swelling in LE and increased abdominal girth.  She wanted direction regarding who should evaluate the patient.  Spoke with Dr. Benay Spice who said that Dr. Barry Dienes is seeing the patient for these issues.  She is not receiving any treatment here.  Spoke with Beth and advised her to call Dr. Marlowe Aschoff office.  She said she had a call into that office already.

## 2014-11-13 NOTE — Telephone Encounter (Signed)
She has pancreatic cancer that explains her LEFT and alk phos abnormality. Can we finish out the course of her antibiotics with oral, cefuroxime 543m BID until the end date noted in my note. Please have home health pull her picc line.

## 2014-11-14 ENCOUNTER — Emergency Department (HOSPITAL_COMMUNITY): Payer: Medicaid Other

## 2014-11-14 ENCOUNTER — Inpatient Hospital Stay (HOSPITAL_COMMUNITY)
Admission: EM | Admit: 2014-11-14 | Discharge: 2014-11-29 | DRG: 420 | Disposition: A | Discharge status: Hospice | Payer: Medicaid Other | Attending: Internal Medicine | Admitting: Internal Medicine

## 2014-11-14 ENCOUNTER — Encounter (HOSPITAL_COMMUNITY): Payer: Self-pay | Admitting: Emergency Medicine

## 2014-11-14 ENCOUNTER — Ambulatory Visit
Admission: RE | Admit: 2014-11-14 | Discharge: 2014-11-14 | Disposition: A | Discharge status: Home / Self Care | Payer: Medicaid Other | Source: Ambulatory Visit | Attending: General Surgery | Admitting: General Surgery

## 2014-11-14 ENCOUNTER — Inpatient Hospital Stay (HOSPITAL_COMMUNITY): Payer: Medicaid Other

## 2014-11-14 DIAGNOSIS — Z923 Personal history of irradiation: Secondary | ICD-10-CM | POA: Diagnosis not present

## 2014-11-14 DIAGNOSIS — K550 Acute vascular disorders of intestine: Secondary | ICD-10-CM | POA: Diagnosis present

## 2014-11-14 DIAGNOSIS — E43 Unspecified severe protein-calorie malnutrition: Secondary | ICD-10-CM | POA: Diagnosis present

## 2014-11-14 DIAGNOSIS — Z86010 Personal history of colonic polyps: Secondary | ICD-10-CM

## 2014-11-14 DIAGNOSIS — E7220 Disorder of urea cycle metabolism, unspecified: Secondary | ICD-10-CM | POA: Diagnosis present

## 2014-11-14 DIAGNOSIS — K7290 Hepatic failure, unspecified without coma: Secondary | ICD-10-CM | POA: Diagnosis present

## 2014-11-14 DIAGNOSIS — Z90411 Acquired partial absence of pancreas: Secondary | ICD-10-CM | POA: Diagnosis present

## 2014-11-14 DIAGNOSIS — D630 Anemia in neoplastic disease: Secondary | ICD-10-CM | POA: Diagnosis present

## 2014-11-14 DIAGNOSIS — I81 Portal vein thrombosis: Secondary | ICD-10-CM | POA: Diagnosis present

## 2014-11-14 DIAGNOSIS — Z66 Do not resuscitate: Secondary | ICD-10-CM | POA: Diagnosis present

## 2014-11-14 DIAGNOSIS — Z6820 Body mass index (BMI) 20.0-20.9, adult: Secondary | ICD-10-CM | POA: Diagnosis not present

## 2014-11-14 DIAGNOSIS — R627 Adult failure to thrive: Secondary | ICD-10-CM | POA: Diagnosis present

## 2014-11-14 DIAGNOSIS — R740 Nonspecific elevation of levels of transaminase and lactic acid dehydrogenase [LDH]: Secondary | ICD-10-CM | POA: Diagnosis present

## 2014-11-14 DIAGNOSIS — Z9049 Acquired absence of other specified parts of digestive tract: Secondary | ICD-10-CM | POA: Diagnosis present

## 2014-11-14 DIAGNOSIS — E876 Hypokalemia: Secondary | ICD-10-CM | POA: Diagnosis present

## 2014-11-14 DIAGNOSIS — K766 Portal hypertension: Secondary | ICD-10-CM | POA: Diagnosis present

## 2014-11-14 DIAGNOSIS — K851 Biliary acute pancreatitis without necrosis or infection: Secondary | ICD-10-CM | POA: Insufficient documentation

## 2014-11-14 DIAGNOSIS — B1920 Unspecified viral hepatitis C without hepatic coma: Secondary | ICD-10-CM | POA: Diagnosis present

## 2014-11-14 DIAGNOSIS — M549 Dorsalgia, unspecified: Secondary | ICD-10-CM | POA: Diagnosis present

## 2014-11-14 DIAGNOSIS — M1990 Unspecified osteoarthritis, unspecified site: Secondary | ICD-10-CM | POA: Diagnosis present

## 2014-11-14 DIAGNOSIS — F329 Major depressive disorder, single episode, unspecified: Secondary | ICD-10-CM | POA: Diagnosis present

## 2014-11-14 DIAGNOSIS — C259 Malignant neoplasm of pancreas, unspecified: Secondary | ICD-10-CM | POA: Diagnosis present

## 2014-11-14 DIAGNOSIS — E860 Dehydration: Secondary | ICD-10-CM | POA: Diagnosis present

## 2014-11-14 DIAGNOSIS — R4182 Altered mental status, unspecified: Secondary | ICD-10-CM

## 2014-11-14 DIAGNOSIS — B182 Chronic viral hepatitis C: Secondary | ICD-10-CM | POA: Diagnosis present

## 2014-11-14 DIAGNOSIS — R7989 Other specified abnormal findings of blood chemistry: Secondary | ICD-10-CM | POA: Insufficient documentation

## 2014-11-14 DIAGNOSIS — K7460 Unspecified cirrhosis of liver: Secondary | ICD-10-CM | POA: Diagnosis present

## 2014-11-14 DIAGNOSIS — R601 Generalized edema: Secondary | ICD-10-CM | POA: Diagnosis present

## 2014-11-14 DIAGNOSIS — R748 Abnormal levels of other serum enzymes: Secondary | ICD-10-CM | POA: Diagnosis present

## 2014-11-14 DIAGNOSIS — E871 Hypo-osmolality and hyponatremia: Secondary | ICD-10-CM | POA: Diagnosis present

## 2014-11-14 DIAGNOSIS — Z79899 Other long term (current) drug therapy: Secondary | ICD-10-CM | POA: Diagnosis not present

## 2014-11-14 DIAGNOSIS — K7682 Hepatic encephalopathy: Secondary | ICD-10-CM | POA: Diagnosis present

## 2014-11-14 DIAGNOSIS — R140 Abdominal distension (gaseous): Secondary | ICD-10-CM

## 2014-11-14 DIAGNOSIS — R7881 Bacteremia: Secondary | ICD-10-CM | POA: Diagnosis present

## 2014-11-14 DIAGNOSIS — M810 Age-related osteoporosis without current pathological fracture: Secondary | ICD-10-CM | POA: Diagnosis present

## 2014-11-14 DIAGNOSIS — F17210 Nicotine dependence, cigarettes, uncomplicated: Secondary | ICD-10-CM | POA: Diagnosis present

## 2014-11-14 DIAGNOSIS — Z515 Encounter for palliative care: Secondary | ICD-10-CM | POA: Diagnosis not present

## 2014-11-14 DIAGNOSIS — J90 Pleural effusion, not elsewhere classified: Secondary | ICD-10-CM | POA: Diagnosis present

## 2014-11-14 DIAGNOSIS — C250 Malignant neoplasm of head of pancreas: Secondary | ICD-10-CM | POA: Diagnosis present

## 2014-11-14 DIAGNOSIS — B961 Klebsiella pneumoniae [K. pneumoniae] as the cause of diseases classified elsewhere: Secondary | ICD-10-CM | POA: Diagnosis present

## 2014-11-14 DIAGNOSIS — D735 Infarction of spleen: Secondary | ICD-10-CM | POA: Diagnosis present

## 2014-11-14 DIAGNOSIS — R188 Other ascites: Secondary | ICD-10-CM | POA: Diagnosis present

## 2014-11-14 DIAGNOSIS — R100 Acute abdomen: Secondary | ICD-10-CM | POA: Diagnosis not present

## 2014-11-14 DIAGNOSIS — K567 Ileus, unspecified: Secondary | ICD-10-CM | POA: Diagnosis present

## 2014-11-14 DIAGNOSIS — Z7189 Other specified counseling: Secondary | ICD-10-CM

## 2014-11-14 DIAGNOSIS — R945 Abnormal results of liver function studies: Secondary | ICD-10-CM

## 2014-11-14 DIAGNOSIS — R109 Unspecified abdominal pain: Secondary | ICD-10-CM

## 2014-11-14 LAB — COMPREHENSIVE METABOLIC PANEL
ALBUMIN: 1.4 g/dL — AB (ref 3.5–5.2)
ALT: 53 U/L — ABNORMAL HIGH (ref 0–35)
AST: 128 U/L — ABNORMAL HIGH (ref 0–37)
Alkaline Phosphatase: 1216 U/L — ABNORMAL HIGH (ref 39–117)
Anion gap: 10 (ref 5–15)
BUN: 12 mg/dL (ref 6–23)
CO2: 21 mmol/L (ref 19–32)
Calcium: 7.7 mg/dL — ABNORMAL LOW (ref 8.4–10.5)
Chloride: 108 mmol/L (ref 96–112)
Creatinine, Ser: 0.54 mg/dL (ref 0.50–1.10)
GFR calc Af Amer: >90 mL/min (ref >90)
GLUCOSE: 101 mg/dL — AB (ref 70–99)
Potassium: <2 mmol/L — CL (ref 3.5–5.1)
Sodium: 139 mmol/L (ref 135–145)
Total Bilirubin: 4.9 mg/dL — ABNORMAL HIGH (ref 0.3–1.2)
Total Protein: 8.1 g/dL (ref 6.0–8.3)

## 2014-11-14 LAB — CBC
HCT: 26 % — ABNORMAL LOW (ref 36.0–46.0)
Hemoglobin: 8.5 g/dL — ABNORMAL LOW (ref 12.0–15.0)
MCH: 28 pg (ref 26.0–34.0)
MCHC: 32.7 g/dL (ref 30.0–36.0)
MCV: 85.5 fL (ref 78.0–100.0)
PLATELETS: 279 10*3/uL (ref 150–400)
RBC: 3.04 MIL/uL — AB (ref 3.87–5.11)
RDW: 20.8 % — ABNORMAL HIGH (ref 11.5–15.5)
WBC: 10.5 10*3/uL (ref 4.0–10.5)

## 2014-11-14 LAB — URINE MICROSCOPIC-ADD ON

## 2014-11-14 LAB — BLOOD GAS, ARTERIAL
Acid-base deficit: 2.4 mmol/L — ABNORMAL HIGH (ref 0.0–2.0)
Bicarbonate: 19.1 mEq/L — ABNORMAL LOW (ref 20.0–24.0)
Drawn by: 257701
O2 SAT: 97.1 %
PH ART: 7.533 — AB (ref 7.350–7.450)
Patient temperature: 98.6
TCO2: 17.6 mmol/L (ref 0–100)
pCO2 arterial: 22.8 mmHg — ABNORMAL LOW (ref 35.0–45.0)
pO2, Arterial: 85.7 mmHg (ref 80.0–100.0)

## 2014-11-14 LAB — URINALYSIS, ROUTINE W REFLEX MICROSCOPIC
Glucose, UA: NEGATIVE mg/dL
Hgb urine dipstick: NEGATIVE
Ketones, ur: 15 mg/dL — AB
NITRITE: NEGATIVE
Protein, ur: 30 mg/dL — AB
Specific Gravity, Urine: 1.018 (ref 1.005–1.030)
UROBILINOGEN UA: 1 mg/dL (ref 0.0–1.0)
pH: 6.5 (ref 5.0–8.0)

## 2014-11-14 LAB — PROTIME-INR
INR: 1.6 — AB (ref 0.00–1.49)
Prothrombin Time: 19.2 seconds — ABNORMAL HIGH (ref 11.6–15.2)

## 2014-11-14 LAB — CBG MONITORING, ED
GLUCOSE-CAPILLARY: 92 mg/dL (ref 70–99)
GLUCOSE-CAPILLARY: 99 mg/dL (ref 70–99)

## 2014-11-14 LAB — AMMONIA: Ammonia: 120 umol/L — ABNORMAL HIGH (ref 11–32)

## 2014-11-14 LAB — MAGNESIUM: MAGNESIUM: 1.9 mg/dL (ref 1.5–2.5)

## 2014-11-14 LAB — LIPASE, BLOOD: LIPASE: 16 U/L (ref 11–59)

## 2014-11-14 LAB — MRSA PCR SCREENING: MRSA by PCR: NEGATIVE

## 2014-11-14 MED ORDER — SODIUM CHLORIDE 0.9 % IV BOLUS (SEPSIS)
1000.0000 mL | Freq: Once | INTRAVENOUS | Status: AC
  Administered 2014-11-14: 1000 mL via INTRAVENOUS

## 2014-11-14 MED ORDER — LIP MEDEX EX OINT
TOPICAL_OINTMENT | CUTANEOUS | Status: AC
  Administered 2014-11-14: 20:00:00
  Filled 2014-11-14: qty 7

## 2014-11-14 MED ORDER — ONDANSETRON HCL 4 MG/2ML IJ SOLN
4.0000 mg | Freq: Four times a day (QID) | INTRAMUSCULAR | Status: DC | PRN
  Administered 2014-11-15 – 2014-11-17 (×4): 4 mg via INTRAVENOUS
  Filled 2014-11-14 (×4): qty 2

## 2014-11-14 MED ORDER — IOHEXOL 300 MG/ML  SOLN: 50.0000 mL | Freq: Once | INTRAMUSCULAR | Status: DC | PRN

## 2014-11-14 MED ORDER — POTASSIUM CHLORIDE 10 MEQ/100ML IV SOLN
10.0000 meq | INTRAVENOUS | Status: AC
  Administered 2014-11-14 (×6): 10 meq via INTRAVENOUS
  Filled 2014-11-14 (×3): qty 100

## 2014-11-14 MED ORDER — POTASSIUM CHLORIDE 10 MEQ/100ML IV SOLN
10.0000 meq | Freq: Once | INTRAVENOUS | Status: AC
  Administered 2014-11-14: 10 meq via INTRAVENOUS
  Filled 2014-11-14: qty 100

## 2014-11-14 MED ORDER — POTASSIUM CHLORIDE 10 MEQ/100ML IV SOLN: 10.0000 meq | Freq: Once | INTRAVENOUS | Status: DC

## 2014-11-14 MED ORDER — POTASSIUM CHLORIDE IN NACL 40-0.9 MEQ/L-% IV SOLN
INTRAVENOUS | Status: AC
  Administered 2014-11-14 – 2014-11-15 (×2): 75 mL/h via INTRAVENOUS
  Filled 2014-11-14 (×2): qty 1000

## 2014-11-14 MED ORDER — SODIUM CHLORIDE 0.9 % IJ SOLN
3.0000 mL | Freq: Two times a day (BID) | INTRAMUSCULAR | Status: DC
  Administered 2014-11-16 – 2014-11-25 (×5): 3 mL via INTRAVENOUS

## 2014-11-14 MED ORDER — MORPHINE SULFATE 2 MG/ML IJ SOLN
1.0000 mg | INTRAMUSCULAR | Status: DC | PRN
  Administered 2014-11-14 – 2014-11-18 (×17): 1 mg via INTRAVENOUS
  Filled 2014-11-14 (×17): qty 1

## 2014-11-14 MED ORDER — PANTOPRAZOLE SODIUM 40 MG IV SOLR
40.0000 mg | INTRAVENOUS | Status: DC
  Administered 2014-11-14 – 2014-11-22 (×9): 40 mg via INTRAVENOUS
  Filled 2014-11-14 (×10): qty 40

## 2014-11-14 MED ORDER — LACTULOSE ENEMA
300.0000 mL | Freq: Once | ORAL | Status: AC
  Administered 2014-11-14: 300 mL via RECTAL
  Filled 2014-11-14: qty 300

## 2014-11-14 MED ORDER — IOHEXOL 300 MG/ML  SOLN
100.0000 mL | Freq: Once | INTRAMUSCULAR | Status: AC | PRN
  Administered 2014-11-14: 100 mL via INTRAVENOUS

## 2014-11-14 MED ORDER — SODIUM CHLORIDE 0.9 % IV SOLN
3.0000 g | INTRAVENOUS | Status: AC
  Administered 2014-11-14: 3 g via INTRAVENOUS
  Filled 2014-11-14: qty 3

## 2014-11-14 MED ORDER — ALBUTEROL SULFATE (2.5 MG/3ML) 0.083% IN NEBU: 2.5000 mg | INHALATION_SOLUTION | RESPIRATORY_TRACT | Status: DC | PRN

## 2014-11-14 MED ORDER — SODIUM CHLORIDE 0.9 % IV SOLN
1.5000 g | Freq: Once | INTRAVENOUS | Status: DC
  Filled 2014-11-14: qty 1.5

## 2014-11-14 MED ORDER — HYDRALAZINE HCL 20 MG/ML IJ SOLN
5.0000 mg | Freq: Four times a day (QID) | INTRAMUSCULAR | Status: DC | PRN
  Administered 2014-11-14: 5 mg via INTRAVENOUS
  Filled 2014-11-14: qty 1

## 2014-11-14 MED ORDER — CHLORHEXIDINE GLUCONATE 0.12 % MT SOLN
15.0000 mL | Freq: Two times a day (BID) | OROMUCOSAL | Status: DC
  Administered 2014-11-14 – 2014-11-28 (×21): 15 mL via OROMUCOSAL
  Filled 2014-11-14 (×34): qty 15

## 2014-11-14 MED ORDER — CETYLPYRIDINIUM CHLORIDE 0.05 % MT LIQD
7.0000 mL | Freq: Two times a day (BID) | OROMUCOSAL | Status: DC
  Administered 2014-11-15 – 2014-11-27 (×14): 7 mL via OROMUCOSAL

## 2014-11-14 MED ORDER — ACETAMINOPHEN 650 MG RE SUPP
650.0000 mg | Freq: Four times a day (QID) | RECTAL | Status: DC | PRN
  Administered 2014-11-20: 650 mg via RECTAL
  Filled 2014-11-14: qty 1

## 2014-11-14 MED ORDER — SODIUM CHLORIDE 0.9 % IV SOLN
3.0000 g | Freq: Four times a day (QID) | INTRAVENOUS | Status: DC
  Administered 2014-11-14 – 2014-11-22 (×32): 3 g via INTRAVENOUS
  Filled 2014-11-14 (×33): qty 3

## 2014-11-14 NOTE — ED Provider Notes (Signed)
CSN: [id number]     Arrival date & time 11/14/14  1049 History   First MD Initiated Contact with Patient 11/14/14 1104     Chief Complaint  Patient presents with  . Ascites  . Weakness  . Altered Mental Status     (Consider location/radiation/quality/duration/timing/severity/associated sxs/prior Treatment) Patient is a 64 y.o. female presenting with altered mental status.  Altered Mental Status Presenting symptoms: confusion, disorientation and lethargy   Severity:  Severe Duration:  3 days Timing:  Constant Progression:  Worsening Chronicity:  Recurrent Context comment:  Pancreatitc ca with recurrent klebsiella infection Associated symptoms: abdominal pain   Associated symptoms: no fever, no nausea and no vomiting     Past Medical History  Diagnosis Date  . Arthritis   . Depression   . H/O: substance abuse   . Back pain   . Personal history of colonic polyp - adenoma 10/26/2013    10/26/2013 diminutive sigmoid polyp removed    . Pancreatitis, acute   . Hypertension   . Osteoporosis   . Radiation 04/30/14-06/08/14    pancreas 50 gray  . Cancer     pancreatic cancer  . Hemorrhagic shock 09/14/2014  . Acute GI bleeding 09/14/2014  . Thrombocytopenia 09/16/2014  . 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 Acute respiratory failure with hypoxia   . Bacteremia due to Klebsiella pneumoniae   . Anemia due to acute blood loss 09/26/2014   Past Surgical History  Procedure Laterality Date  . Lumbar epidural injection    . Colonoscopy    . Ercp N/A 03/06/2014    Procedure: ENDOSCOPIC RETROGRADE CHOLANGIOPANCREATOGRAPHY (ERCP);  Surgeon: Gatha Mayer, MD;  Location: [facility] ENDOSCOPY;  Service: Endoscopy;  Laterality: N/A;  . Eus N/A 04/05/2014    Procedure: UPPER ENDOSCOPIC ULTRASOUND (EUS) RADIAL;  Surgeon: [name], MD;  Location: [facility] ENDOSCOPY;  Service: Endoscopy;  Laterality: N/A;  . Whipple procedure N/A 08/07/2014    Procedure:  DIAGNOSTIC LAPAROSCOPY, WHIPPLE PROCEDURE;   Surgeon: Stark Klein, MD;  Location: [facility] ORS;  Service: General;  Laterality: N/A;  . Esophagogastroduodenoscopy N/A 09/28/2014    Procedure: ESOPHAGOGASTRODUODENOSCOPY (EGD);  Surgeon: Ladene Artist, MD;  Location: [facility] ENDOSCOPY;  Service: Endoscopy;  Laterality: N/A;   Family History  Problem Relation Age of Onset  . Colon cancer Neg Hx   . Esophageal cancer Neg Hx   . Rectal cancer Neg Hx   . Stomach cancer Neg Hx   . CAD Mother   . Cancer Father   . Cirrhosis Sister     she is a heavy drinker.    History  Substance Use Topics  . Smoking status: Current Every Day Smoker -- 0.25 packs/day for 40 years    Types: Cigarettes  . Smokeless tobacco: Current User  . Alcohol Use: No     Comment: occasionally. Quit  , none in [place] drank before  to help with pain before ERD visit"   OB History    No data available     Review of Systems  Unable to perform ROS: Mental status change  Constitutional: Negative for fever.  Gastrointestinal: Positive for abdominal pain. Negative for nausea and vomiting.  Psychiatric/Behavioral: Positive for confusion.      Allergies  Review of patient's allergies indicates no known allergies.  Home Medications   Prior to Admission medications   Medication Sig Start Date End Date Taking? Authorizing Provider  antiseptic oral rinse (CPC / CETYLPYRIDINIUM CHLORIDE 0.05%) 0.05 % LIQD solution 7 mLs by Mouth  Rinse route 2 (two) times daily. 10/09/14  Yes Robbie Lis, MD  CALCIUM-VITAMIN D PO Take 1 capsule by mouth daily.   Yes Historical Provider, MD  feeding supplement, ENSURE COMPLETE, (ENSURE COMPLETE) LIQD Take 237 mLs by mouth 3 (three) times daily between meals. 10/09/14  Yes Robbie Lis, MD  megestrol (MEGACE ORAL) 40 MG/ML suspension Take 5 mLs (200 mg total) by mouth 2 (two) times daily. 10/29/14  Yes Owens Shark, NP  ondansetron (ZOFRAN) 4 MG tablet Take 1 tablet (4 mg total) by mouth every 8 (eight) hours as needed for nausea or  vomiting. 10/09/14  Yes Robbie Lis, MD  Ampicillin-Sulbactam 3 g in sodium chloride 0.9 % 100 mL Inject 3 g into the vein every 6 (six) hours. Patient not taking: Reported on 11/14/2014 10/23/14   Venetia Maxon Rama, MD  fentaNYL (DURAGESIC - DOSED MCG/HR) 25 MCG/HR patch Place 1 patch (25 mcg total) onto the skin every 3 (three) days. Patient not taking: Reported on 11/14/2014 10/09/14   Robbie Lis, MD  oxyCODONE-acetaminophen (PERCOCET/ROXICET) 5-325 MG per tablet Take 1-2 tablets by mouth every 4 (four) hours as needed for moderate pain. Patient not taking: Reported on 11/14/2014 10/29/14   Owens Shark, NP  pantoprazole (PROTONIX) 40 MG tablet Take 1 tablet (40 mg total) by mouth 2 (two) times daily as needed (heartburn). Patient not taking: Reported on 11/14/2014 10/23/14   Venetia Maxon Rama, MD  pregabalin (LYRICA) 25 MG capsule Take 1 capsule (25 mg total) by mouth at bedtime. Patient not taking: Reported on 11/14/2014 10/09/14   Robbie Lis, MD  prochlorperazine (COMPAZINE) 5 MG tablet TAKE ONE TO TWO TABS EVERY SIX HOURS PRN. Patient not taking: Reported on 11/14/2014 10/11/14   Ladell Pier, MD  sucralfate (CARAFATE) 1 GM/10ML suspension Take 10 mLs (1 g total) by mouth 4 (four) times daily -  with meals and at bedtime. Patient not taking: Reported on 11/14/2014 10/09/14   Robbie Lis, MD   BP 180/94 mmHg  Pulse 95  Temp(Src) 98.6 F (37 C) (Oral)  Resp 14  Ht 5\' 3"  (1.6 m)  Wt 126 lb 1.7 oz (57.2 kg)  BMI 22.34 kg/m2  SpO2 98% Physical Exam  Constitutional: She appears well-developed and well-nourished.  HENT:  Head: Normocephalic and atraumatic.  Right Ear: External ear normal.  Left Ear: External ear normal.  Eyes: Conjunctivae and EOM are normal. Pupils are equal, round, and reactive to light.  Neck: Normal range of motion. Neck supple.  Cardiovascular: Normal rate, regular rhythm, normal heart sounds and intact distal pulses.   Pulmonary/Chest: Effort normal and breath  sounds normal.  Abdominal: Soft. Bowel sounds are normal. There is generalized tenderness.  Musculoskeletal: Normal range of motion.  Neurological: GCS eye subscore is 3. GCS verbal subscore is 3. GCS motor subscore is 5.  Skin: Skin is warm and dry.  Vitals reviewed.   ED Course  Procedures (including critical care time) Labs Review Labs Reviewed  COMPREHENSIVE METABOLIC PANEL - Abnormal; Notable for the following:    Potassium <2.0 (*)    Glucose, Bld 101 (*)    Calcium 7.7 (*)    Albumin 1.4 (*)    AST 128 (*)    ALT 53 (*)    Alkaline Phosphatase 1216 (*)    Total Bilirubin 4.9 (*)    All other components within normal limits  CBC - Abnormal; Notable for the following:    RBC 3.04 (*)  Hemoglobin 8.5 (*)    HCT 26.0 (*)    RDW 20.8 (*)    All other components within normal limits  URINALYSIS, ROUTINE W REFLEX MICROSCOPIC - Abnormal; Notable for the following:    Color, Urine ORANGE (*)    APPearance CLOUDY (*)    Bilirubin Urine LARGE (*)    Ketones, ur 15 (*)    Protein, ur 30 (*)    Leukocytes, UA TRACE (*)    All other components within normal limits  BLOOD GAS, ARTERIAL - Abnormal; Notable for the following:    pH, Arterial 7.533 (*)    pCO2 arterial 22.8 (*)    Bicarbonate 19.1 (*)    Acid-base deficit 2.4 (*)    All other components within normal limits  AMMONIA - Abnormal; Notable for the following:    Ammonia 120 (*)    All other components within normal limits  PROTIME-INR - Abnormal; Notable for the following:    Prothrombin Time 19.2 (*)    INR 1.60 (*)    All other components within normal limits  URINE MICROSCOPIC-ADD ON - Abnormal; Notable for the following:    Bacteria, UA MANY (*)    Casts GRANULAR CAST (*)    All other components within normal limits  COMPREHENSIVE METABOLIC PANEL - Abnormal; Notable for the following:    Potassium 3.1 (*)    Chloride 115 (*)    CO2 16 (*)    Creatinine, Ser 0.49 (*)    Calcium 7.1 (*)    Albumin 1.3  (*)    AST 126 (*)    ALT 53 (*)    Alkaline Phosphatase 1035 (*)    Total Bilirubin 5.1 (*)    All other components within normal limits  MRSA PCR SCREENING  CULTURE, BLOOD (ROUTINE X 2)  CULTURE, BLOOD (ROUTINE X 2)  LIPASE, BLOOD  MAGNESIUM  CBC  CBG MONITORING, ED  CBG MONITORING, ED    Imaging Review Ct Head Wo Contrast  11/14/2014   CLINICAL DATA:  Altered mental status  EXAM: CT HEAD WITHOUT CONTRAST  TECHNIQUE: Contiguous axial images were obtained from the base of the skull through the vertex without intravenous contrast.  COMPARISON:  October 27, 2005  FINDINGS: The ventricles are normal in size and configuration. There is no mass, hemorrhage, extra-axial fluid collection, or midline shift. Gray-white compartments are normal. No acute infarct apparent. The bony calvarium appears intact. The mastoid air cells are clear.  IMPRESSION: No intracranial mass, hemorrhage, or focal gray -white compartment lesions/acute appearing infarct.   Electronically Signed   By: Lowella Grip III M.D.   On: 11/14/2014 14:17   US Abdomen Complete  11/14/2014   CLINICAL DATA:  Pancreatic cancer post Whipple procedure 08/07/2014, abdominal swelling for 1 week, prior cholecystectomy, hypertension, radiation therapy, smoker  EXAM: ULTRASOUND ABDOMEN COMPLETE  COMPARISON:  CT abdomen and pelvis 10/16/2014  FINDINGS: Gallbladder: Surgically absent  Common bile duct: Diameter: 5 mm diameter, normal  Liver: Heterogeneous in echogenicity. No discrete mass. Portal vein is not well demonstrated ; portal vein demonstrated thrombosis on the prior CT.  IVC: Intrahepatic portion normal appearance. Infrahepatic portion nonvisualized due to bowel gas.  Pancreas: Post Whipple procedure by history. Pancreatic bed inadequately visualized due to bowel gas.  Spleen: Normal appearance, 5.0 cm length  Right Kidney: Length: 9.3 cm. Normal morphology without mass or hydronephrosis.  Left Kidney: Length: 10.0 cm. Normal  morphology without mass or hydronephrosis.  Abdominal aorta: Proximally normal caliber, remainder obscured  by bowel gas  Other findings: Significant ascites throughout abdomen and pelvis.  IMPRESSION: Significant ascites significantly increased since prior CT exam, likely cause of abdominal swelling.  Limited exam due to bowel gas, with inadequate visualization of the pancreatic bed, portal vein and portions of the aorta.  Post cholecystectomy.   Electronically Signed   By: Lavonia Dana M.D.   On: 11/14/2014 14:32   Ct Abdomen Pelvis W Contrast  11/14/2014   CLINICAL DATA:  64 year old female with abdominal pain and distension. Current history of pancreatic cancer status post Whipple and radiation. Subsequent encounter.  EXAM: CT ABDOMEN AND PELVIS WITH CONTRAST  TECHNIQUE: Multidetector CT imaging of the abdomen and pelvis was performed using the standard protocol following bolus administration of intravenous contrast.  CONTRAST:  134mL OMNIPAQUE IOHEXOL 300 MG/ML  SOLN  COMPARISON:  10/16/2014 and earlier.  FINDINGS: New moderate size bilateral pleural effusions with lower lobe atelectasis. No pericardial effusion.  Advanced degenerative changes in the lower lumbar spine. No acute osseous abnormality identified.  Increased body wall stranding and density in keeping with a degree of anasarca. Moderate to large volume of ascites in the abdomen has significantly progressed, and in the upper abdomen this is associated with new lobular mass effect on the liver contour (series 2, image 17).  No dilated large bowel. Widespread small and large bowel wall thickening. Pancreatic or biliary stent re- identified within a small bowel loop in the right upper quadrant which today is dilated with fluid up to 4 cm diameter. There is continued intra and extrahepatic biliary ductal dilatation. Pancreatic tail atrophy again noted.  In January of the main portal vein was thrombosed. On today images no portal venous system  enhancement is identified.  The stomach is thick walled. Oral contrast was administered, and has reached mid small bowel loops.  Major arterial structures in the abdomen and pelvis appear patent. Aortoiliac calcified atherosclerosis noted. Renal enhancement and contrast excretion is within normal limits.  IMPRESSION: 1. Progression of disease. Progressed and now diffuse portal venous system thrombosis since January. 2. Anasarca. Large volume ascites, mass effect on the liver may reflect subcapsular or loculated ascites. Diffuse moderate to severe bowel wall thickening. Moderate bilateral pleural effusions. 3. Fluid-filled and dilated small bowel loop draining the pancreas.   Electronically Signed   By: Genevie Ann M.D.   On: 11/14/2014 14:42     EKG Interpretation   Date/Time:  Wednesday November 14 2014 11:11:28 EST Ventricular Rate:  74 PR Interval:  129 QRS Duration: 75 QT Interval:  448 QTC Calculation: 497 R Axis:   62 Text Interpretation:  Sinus rhythm Probable left atrial enlargement  Probable LVH with secondary repol abnrm Borderline prolonged QT interval  Confirmed by Debby Freiberg [phone]) on 11/14/2014 12:13:30 PM      MDM   Final diagnoses:  Abdominal pain, acute  Altered mental status    64 y.o. female with pertinent PMH of pancreatic adenocarcinoma sp whipple and stenting with recent hospitalization for presumed septic portal vein thrombosis presents with altered mental status.  Pt is DNR, healthcare POA, daughter, is in room and is reasonable, confirms Full code status.    Wu as above with hyperammonemia.  This is likely etiology of altered mental status.  Also ordered IR drainage of ascites to ro SBP.  Empirically covered with unasyn.  Repleted K x 2.  Admitted to stepdown.  I have reviewed all laboratory and imaging studies if ordered as above  1. Abdominal pain, acute  2. Altered mental status         Debby Freiberg, MD 11/15/14 [id number]

## 2014-11-14 NOTE — H&P (Signed)
History and Physical  Melinda Conrad VZD:[id number] DOB: [birth year] DOA: 11/14/2014  Referring physician: Dr. Debby Freiberg, EDP PCP: Kevan Ny, MD  Outpatient Specialists:  1. Oncology: Dr. Benay Spice 2. ID: Dr. Carlyle Basques 3. Surgery: Dr. Stark Klein  Chief Complaint: Altered mental status, abdominal distention and pain and poor appetite.  HPI: Melinda Conrad is a 64 y.o. female with history of adenocarcinoma of head of pancreas, status post Whipple's procedure 08/07/14, completed concurrent capecitabine and radiation, poor performance status and hence did not get adjuvant chemotherapy, recurrent Klebsiella bacteremia- attributed to intra-abdominal source/presumed septic portal vein thrombus, acute portal vein thrombosis-not on anticoagulation secondary to GI bleed, tobacco abuse & hepatitis C presented to the [facility] ED on 11/14/14 with complaints of progressive mental status changes, abdominal distention/pain and poor appetite. Patient unable to provide history secondary to AMS. No family at bedside currently. History obtained from EDP who discussed with family. Patient apparently has been having progressive failure to thrive since Whipple's procedure. However over the last 3 days, family have noticed gradually worsening confusion, disorientation and lethargic to a point of near unresponsiveness today, decreased oral intake of liquids and food but no nausea or vomiting, progressive abdominal and lower extremity swelling with intermittent abdominal pain and no fevers. Patient has been getting IV antibiotics through right upper extremity PICC line and therefore plans to remove PICC line and from IV Unasyn to oral cefuroxime. In the ED, patient is somnolent but arousable, afebrile, elevated blood pressure, lab work significant for potassium <2, abnormal LFTs, ammonia 120, hemoglobin 8.5, CT head without acute findings, CT abdomen and pelvis: Diffuse portal vein system thrombosis,  anasarca, diffuse moderate to severe bowel wall thickening, moderate bilateral pleural effusions and fluid filled and dilated small bowel loop draining the pancreas. Hospitalist admission was requested.   Review of Systems: All systems reviewed and apart from history of presenting illness, are negative.  Past Medical History  Diagnosis Date  . Arthritis   . Depression   . H/O: substance abuse   . Back pain   . Personal history of colonic polyp - adenoma 10/26/2013    10/26/2013 diminutive sigmoid polyp removed    . Pancreatitis, acute   . Hypertension   . Osteoporosis   . Radiation 04/30/14-06/08/14    pancreas 50 gray  . Cancer     pancreatic cancer  . Hemorrhagic shock 09/14/2014  . Acute GI bleeding 09/14/2014  . Thrombocytopenia 09/16/2014  . 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 Acute respiratory failure with hypoxia   . Bacteremia due to Klebsiella pneumoniae   . Anemia due to acute blood loss 09/26/2014   Past Surgical History  Procedure Laterality Date  . Lumbar epidural injection    . Colonoscopy    . Ercp N/A 03/06/2014    Procedure: ENDOSCOPIC RETROGRADE CHOLANGIOPANCREATOGRAPHY (ERCP);  Surgeon: Gatha Mayer, MD;  Location: [facility] ENDOSCOPY;  Service: Endoscopy;  Laterality: N/A;  . Eus N/A 04/05/2014    Procedure: UPPER ENDOSCOPIC ULTRASOUND (EUS) RADIAL;  Surgeon: [name], MD;  Location: [facility] ENDOSCOPY;  Service: Endoscopy;  Laterality: N/A;  . Whipple procedure N/A 08/07/2014    Procedure:  DIAGNOSTIC LAPAROSCOPY, WHIPPLE PROCEDURE;  Surgeon: Stark Klein, MD;  Location: [facility] ORS;  Service: General;  Laterality: N/A;  . Esophagogastroduodenoscopy N/A 09/28/2014    Procedure: ESOPHAGOGASTRODUODENOSCOPY (EGD);  Surgeon: Ladene Artist, MD;  Location: [facility] ENDOSCOPY;  Service: Endoscopy;  Laterality: N/A;   Social History:  reports that she has  been smoking Cigarettes.  She has a 10 pack-year smoking history. She uses smokeless tobacco. She reports that she does not drink  alcohol or use illicit drugs. Unable to obtain any further history secondary to patient's AMS.  No Known Allergies  Family History  Problem Relation Age of Onset  . Colon cancer Neg Hx   . Esophageal cancer Neg Hx   . Rectal cancer Neg Hx   . Stomach cancer Neg Hx   . CAD Mother   . Cancer Father   . Cirrhosis Sister     she is a heavy drinker.     Prior to Admission medications   Medication Sig Start Date End Date Taking? Authorizing Provider  antiseptic oral rinse (CPC / CETYLPYRIDINIUM CHLORIDE 0.05%) 0.05 % LIQD solution 7 mLs by Mouth Rinse route 2 (two) times daily. 10/09/14  Yes Robbie Lis, MD  CALCIUM-VITAMIN D PO Take 1 capsule by mouth daily.   Yes Historical Provider, MD  feeding supplement, ENSURE COMPLETE, (ENSURE COMPLETE) LIQD Take 237 mLs by mouth 3 (three) times daily between meals. 10/09/14  Yes Robbie Lis, MD  megestrol (MEGACE ORAL) 40 MG/ML suspension Take 5 mLs (200 mg total) by mouth 2 (two) times daily. 10/29/14  Yes Owens Shark, NP  ondansetron (ZOFRAN) 4 MG tablet Take 1 tablet (4 mg total) by mouth every 8 (eight) hours as needed for nausea or vomiting. 10/09/14  Yes Robbie Lis, MD  Ampicillin-Sulbactam 3 g in sodium chloride 0.9 % 100 mL Inject 3 g into the vein every 6 (six) hours. Patient not taking: Reported on 11/14/2014 10/23/14   Venetia Maxon Rama, MD  fentaNYL (DURAGESIC - DOSED MCG/HR) 25 MCG/HR patch Place 1 patch (25 mcg total) onto the skin every 3 (three) days. Patient not taking: Reported on 11/14/2014 10/09/14   Robbie Lis, MD  oxyCODONE-acetaminophen (PERCOCET/ROXICET) 5-325 MG per tablet Take 1-2 tablets by mouth every 4 (four) hours as needed for moderate pain. Patient not taking: Reported on 11/14/2014 10/29/14   Owens Shark, NP  pantoprazole (PROTONIX) 40 MG tablet Take 1 tablet (40 mg total) by mouth 2 (two) times daily as needed (heartburn). Patient not taking: Reported on 11/14/2014 10/23/14   Venetia Maxon Rama, MD  pregabalin (LYRICA)  25 MG capsule Take 1 capsule (25 mg total) by mouth at bedtime. Patient not taking: Reported on 11/14/2014 10/09/14   Robbie Lis, MD  prochlorperazine (COMPAZINE) 5 MG tablet TAKE ONE TO TWO TABS EVERY SIX HOURS PRN. Patient not taking: Reported on 11/14/2014 10/11/14   Ladell Pier, MD  sucralfate (CARAFATE) 1 GM/10ML suspension Take 10 mLs (1 g total) by mouth 4 (four) times daily -  with meals and at bedtime. Patient not taking: Reported on 11/14/2014 10/09/14   Robbie Lis, MD   Physical Exam: Filed Vitals:   11/14/14 1108 11/14/14 1313 11/14/14 1515 11/14/14 1546  BP:  178/81 165/97 182/96  Pulse:  66  108  Temp: 97.7 F (36.5 C) 97.6 F (36.4 C)    TempSrc: Rectal Oral    Resp:  16 15 26   SpO2:  98%  96%     General exam: Small built and cachectic, chronically ill-looking middle-aged female, lying comfortably supine on the gurney in no obvious distress.  Head, eyes and ENT: Nontraumatic and normocephalic. Pupils equally reacting to light and accommodation. Oral mucosa dry.  Neck: Supple. No JVD, carotid bruit or thyromegaly.  Lymphatics: No lymphadenopathy.  Respiratory system: Diminished  breath sounds in the bases but otherwise clear to auscultation/poor inspiratory effort. No increased work of breathing.  Cardiovascular system: S1 and S2 heard, RRR. No JVD, murmurs, gallops, clicks. 1+ pitting bilateral leg edema right >left.  Gastrointestinal system: Abdomen is moderately distended, diffusely tender, mild guarding without rigidity or rebound. Healed midline laparotomy scar. Diminished/few bowel sounds heard. No organomegaly or masses appreciated. Ascites + +.  Central nervous system: Somnolent and easily arousable to call, will briefly open eyes and drift back to sleep. Oriented only to self. No focal neurological deficits.  Extremities: Moving all limbs symmetrically. Peripheral pulses symmetrically felt.   Skin: No rashes or acute findings.  Musculoskeletal  system: Negative exam.  Psychiatry: Unable to assess secondary to AMS   Labs on Admission:  Basic Metabolic Panel:  Recent Labs Lab 11/14/14 1154  NA 139  K <2.0*  CL 108  CO2 21  GLUCOSE 101*  BUN 12  CREATININE 0.54  CALCIUM 7.7*   Liver Function Tests:  Recent Labs Lab 11/14/14 1154  AST 128*  ALT 53*  ALKPHOS 1216*  BILITOT 4.9*  PROT 8.1  ALBUMIN 1.4*    Recent Labs Lab 11/14/14 1152  LIPASE 16    Recent Labs Lab 11/14/14 1153  AMMONIA 120*   CBC:  Recent Labs Lab 11/14/14 1154  WBC 10.5  HGB 8.5*  HCT 26.0*  MCV 85.5  PLT 279   Cardiac Enzymes: No results for input(s): CKTOTAL, CKMB, CKMBINDEX, TROPONINI in the last 168 hours.  BNP (last 3 results) No results for input(s): PROBNP in the last 8760 hours. CBG:  Recent Labs Lab 11/14/14 1120 11/14/14 1150  GLUCAP 92 99    Radiological Exams on Admission: Ct Head Wo Contrast  11/14/2014   CLINICAL DATA:  Altered mental status  EXAM: CT HEAD WITHOUT CONTRAST  TECHNIQUE: Contiguous axial images were obtained from the base of the skull through the vertex without intravenous contrast.  COMPARISON:  October 27, 2005  FINDINGS: The ventricles are normal in size and configuration. There is no mass, hemorrhage, extra-axial fluid collection, or midline shift. Gray-white compartments are normal. No acute infarct apparent. The bony calvarium appears intact. The mastoid air cells are clear.  IMPRESSION: No intracranial mass, hemorrhage, or focal gray -white compartment lesions/acute appearing infarct.   Electronically Signed   By: Lowella Grip III M.D.   On: 11/14/2014 14:17   US Abdomen Complete  11/14/2014   CLINICAL DATA:  Pancreatic cancer post Whipple procedure 08/07/2014, abdominal swelling for 1 week, prior cholecystectomy, hypertension, radiation therapy, smoker  EXAM: ULTRASOUND ABDOMEN COMPLETE  COMPARISON:  CT abdomen and pelvis 10/16/2014  FINDINGS: Gallbladder: Surgically absent   Common bile duct: Diameter: 5 mm diameter, normal  Liver: Heterogeneous in echogenicity. No discrete mass. Portal vein is not well demonstrated ; portal vein demonstrated thrombosis on the prior CT.  IVC: Intrahepatic portion normal appearance. Infrahepatic portion nonvisualized due to bowel gas.  Pancreas: Post Whipple procedure by history. Pancreatic bed inadequately visualized due to bowel gas.  Spleen: Normal appearance, 5.0 cm length  Right Kidney: Length: 9.3 cm. Normal morphology without mass or hydronephrosis.  Left Kidney: Length: 10.0 cm. Normal morphology without mass or hydronephrosis.  Abdominal aorta: Proximally normal caliber, remainder obscured by bowel gas  Other findings: Significant ascites throughout abdomen and pelvis.  IMPRESSION: Significant ascites significantly increased since prior CT exam, likely cause of abdominal swelling.  Limited exam due to bowel gas, with inadequate visualization of the pancreatic bed, portal vein and  portions of the aorta.  Post cholecystectomy.   Electronically Signed   By: Lavonia Dana M.D.   On: 11/14/2014 14:32   Ct Abdomen Pelvis W Contrast  11/14/2014   CLINICAL DATA:  64 year old female with abdominal pain and distension. Current history of pancreatic cancer status post Whipple and radiation. Subsequent encounter.  EXAM: CT ABDOMEN AND PELVIS WITH CONTRAST  TECHNIQUE: Multidetector CT imaging of the abdomen and pelvis was performed using the standard protocol following bolus administration of intravenous contrast.  CONTRAST:  191mL OMNIPAQUE IOHEXOL 300 MG/ML  SOLN  COMPARISON:  10/16/2014 and earlier.  FINDINGS: New moderate size bilateral pleural effusions with lower lobe atelectasis. No pericardial effusion.  Advanced degenerative changes in the lower lumbar spine. No acute osseous abnormality identified.  Increased body wall stranding and density in keeping with a degree of anasarca. Moderate to large volume of ascites in the abdomen has significantly  progressed, and in the upper abdomen this is associated with new lobular mass effect on the liver contour (series 2, image 17).  No dilated large bowel. Widespread small and large bowel wall thickening. Pancreatic or biliary stent re- identified within a small bowel loop in the right upper quadrant which today is dilated with fluid up to 4 cm diameter. There is continued intra and extrahepatic biliary ductal dilatation. Pancreatic tail atrophy again noted.  In January of the main portal vein was thrombosed. On today images no portal venous system enhancement is identified.  The stomach is thick walled. Oral contrast was administered, and has reached mid small bowel loops.  Major arterial structures in the abdomen and pelvis appear patent. Aortoiliac calcified atherosclerosis noted. Renal enhancement and contrast excretion is within normal limits.  IMPRESSION: 1. Progression of disease. Progressed and now diffuse portal venous system thrombosis since January. 2. Anasarca. Large volume ascites, mass effect on the liver may reflect subcapsular or loculated ascites. Diffuse moderate to severe bowel wall thickening. Moderate bilateral pleural effusions. 3. Fluid-filled and dilated small bowel loop draining the pancreas.   Electronically Signed   By: Genevie Ann M.D.   On: 11/14/2014 14:42    EKG: Independently reviewed. Baseline artifact. Sinus rhythm without acute changes.  Assessment/Plan Principal Problem:   Hepatic encephalopathy Active Problems:   Pancreatic cancer   Protein-calorie malnutrition, severe   Portal vein thrombosis   Hypokalemia   Anemia in neoplastic disease   Recurrent bacteremia   Hyperammonemia   Hepatitis C   Ascites/anasarca   Dehydration   Failure to thrive in adult   1. Hepatic encephalopathy: Secondary to liver disease-? Etiology-hepatitis C, rule out obstructive etiology. Nothing by mouth until consistently awake and alert to safely take by mouth. Lactulose enema. Follow  mental status. CT head: No acute findings. Replace potassium and magnesium. IV fluids. Requested [facility] assistance. 2. Severe hypokalemia: Secondary to poor oral intake. Aggressively replace IV and follow closely. Check magnesium. 3. Dehydration: Secondary to poor oral intake. IV fluids. 4. Anasarca/ascites: Secondary to liver disease, portal vein thrombosis and hypoalbuminemia related to malnutrition. Diagnostic and therapeutic ultrasound-guided paracentesis by interventional radiology. Check ascitic fluid for culture and cytology in addition to other studies. 5. R/O SBP: Management per problem #4. Empirically cover with IV Unasyn. 6. Recurrent Klebsiella bacteremia: Was on IV antibiotics via right upper extremity PICC line. Discussed with ID. Continue IV Unasyn. 7. Portal vein thrombosis: Not on anticoagulation secondary to GI bleed issues. 8. History of GI bleed: No reported bleeding during this admission. IV PPI. 9. Anemia: Secondary  to chronic disease. Hemoglobin may drop post hydration. Follow CBC and transfuse if hemoglobin <7 g per DL. 10. Severe protein calorie malnutrition: Secondary to recurrent infections, cancer diagnosis. Nutrition consult when alert and able to take by mouth. 11. History of hepatitis C: Not on treatment currently. 12. Failure to thrive: Multifactorial 13. Adenocarcinoma of head of pancreas, status post Whipple's procedure: As per oncologist, out of window for adjuvant chemotherapy-patient was not well enough to receive same since surgery. Will discuss with primary surgeon in a.m. 14. ? Small bowel ileus: NPO, IVF      Code Status: Full code-confirmed by EDP with patient's daughter.  Family Communication: None at bedside  Disposition Plan: Admit to stepdown.   Time spent: 71 minutes  Yussuf Sawyers, MD, FACP, FHM. [facility] Pager [phone]  If 7PM-7AM, please contact night-coverage [email or web address] Password [facility] 11/14/2014, 4:22 PM

## 2014-11-14 NOTE — ED Notes (Signed)
Will do in and out cath with help of [place], NT once nurse is done drawing blood

## 2014-11-14 NOTE — Progress Notes (Signed)
ANTIBIOTIC CONSULT NOTE - INITIAL  Pharmacy Consult for Unasyn Indication: Intra-abdominal infection, history of recurrent Klebsiella bacteremia, R/o SBP  No Known Allergies  Patient Measurements:   Height: 5'3" Weight (from 10/29/14: 50.4 kg  Vital Signs: Temp: 97.6 F (36.4 C) (02/24 1313) Temp Source: Oral (02/24 1313) BP: 182/96 mmHg (02/24 1546) Pulse Rate: 108 (02/24 1546) Intake/Output from previous day:   Intake/Output from this shift: Total I/O In: -  Out: 13 [Urine:13]  Labs:  Recent Labs  11/14/14 1154  WBC 10.5  HGB 8.5*  PLT 279  CREATININE 0.54   CrCl cannot be calculated (Unknown ideal weight.). No results for input(s): VANCOTROUGH, VANCOPEAK, VANCORANDOM, GENTTROUGH, GENTPEAK, GENTRANDOM, TOBRATROUGH, TOBRAPEAK, TOBRARND, AMIKACINPEAK, AMIKACINTROU, AMIKACIN in the last 72 hours.   Microbiology: Recent Results (from the past 720 hour(s))  Urine culture     Status: None   Collection Time: 10/16/14  3:35 PM  Result Value Ref Range Status   Specimen Description URINE, RANDOM  Final   Special Requests NONE  Final   Colony Count NO GROWTH Performed at [facility]   Final   Culture NO GROWTH Performed at [facility]   Final   Report Status 10/18/2014 FINAL  Final  Blood culture (routine x 2)     Status: None   Collection Time: 10/16/14  3:41 PM  Result Value Ref Range Status   Specimen Description BLOOD RIGHT HAND  Final   Special Requests BOTTLES DRAWN AEROBIC AND ANAEROBIC 4 CC EA  Final   Culture   Final    KLEBSIELLA PNEUMONIAE Note: SUSCEPTIBILITIES PERFORMED ON PREVIOUS CULTURE WITHIN THE LAST 5 DAYS. STREPTOCOCCUS SPECIES Note: IDENTIFIED AS STREPTOCOCCUS ANGINOSUS SUSCEPTIBILITIES PERFORMED ON PREVIOUS CULTURE WITHIN THE LAST 5 DAYS. Note: Gram Stain Report Called to,Read Back By and Verified With: CAROLYN RN ON 2W AT [phone] BY [facility]    Report Status 10/21/2014 FINAL  Final  Blood culture (routine x 2)     Status:  None   Collection Time: 10/16/14  4:22 PM  Result Value Ref Range Status   Specimen Description BLOOD RIGHT FOREARM  Final   Special Requests BOTTLES DRAWN AEROBIC AND ANAEROBIC 5ML  Final   Culture   Final    KLEBSIELLA PNEUMONIAE Note: Gram Stain Report Called to,Read Back By and Verified With: NICKY CARTER 10/17/14 1425 BY SMITHERSJ STREPTOCOCCUS SPECIES Note: Gram Stain Report Called to,Read Back By and Verified With: MARSHBURN WALTER B IDENTIFIED AS STREPTOCOCCUS ANGINOSUS Performed at [facility]    Report Status 10/21/2014 FINAL  Final   Organism ID, Bacteria KLEBSIELLA PNEUMONIAE  Final   Organism ID, Bacteria STREPTOCOCCUS SPECIES  Final      Susceptibility   Klebsiella pneumoniae - MIC*    AMPICILLIN >=32 RESISTANT Resistant     AMPICILLIN/SULBACTAM 4 SENSITIVE Sensitive     CEFAZOLIN <=4 SENSITIVE Sensitive     CEFEPIME <=1 SENSITIVE Sensitive     CEFTAZIDIME <=1 SENSITIVE Sensitive     CEFTRIAXONE <=1 SENSITIVE Sensitive     CIPROFLOXACIN <=0.25 SENSITIVE Sensitive     GENTAMICIN <=1 SENSITIVE Sensitive     IMIPENEM <=0.25 SENSITIVE Sensitive     PIP/TAZO <=4 SENSITIVE Sensitive     TOBRAMYCIN <=1 SENSITIVE Sensitive     TRIMETH/SULFA <=20 SENSITIVE Sensitive     * KLEBSIELLA PNEUMONIAE   Streptococcus species - MIC (ETEST)*    PENICILLIN 0.125 SENSITIVE Sensitive     * STREPTOCOCCUS SPECIES  MRSA PCR Screening  Status: None   Collection Time: 10/16/14  6:20 PM  Result Value Ref Range Status   MRSA by PCR NEGATIVE NEGATIVE Final    Comment:        The GeneXpert MRSA Assay (FDA approved for NASAL specimens only), is one component of a comprehensive MRSA colonization surveillance program. It is not intended to diagnose MRSA infection nor to guide or monitor treatment for MRSA infections.   Culture, blood (routine x 2)     Status: None   Collection Time: 10/22/14  4:00 PM  Result Value Ref Range Status   Specimen Description BLOOD LEFT ARM  Final    Special Requests BOTTLES DRAWN AEROBIC ONLY 5CC  Final   Culture   Final    NO GROWTH 5 DAYS Performed at [facility]    Report Status 10/28/2014 FINAL  Final  Culture, blood (routine x 2)     Status: None   Collection Time: 10/22/14  4:05 PM  Result Value Ref Range Status   Specimen Description BLOOD LEFT HAND  Final   Special Requests BOTTLES DRAWN AEROBIC ONLY 5CC  Final   Culture   Final    NO GROWTH 5 DAYS Performed at [facility]    Report Status 10/28/2014 FINAL  Final    Medical History: Past Medical History  Diagnosis Date  . Arthritis   . Depression   . H/O: substance abuse   . Back pain   . Personal history of colonic polyp - adenoma 10/26/2013    10/26/2013 diminutive sigmoid polyp removed    . Pancreatitis, acute   . Hypertension   . Osteoporosis   . Radiation 04/30/14-06/08/14    pancreas 50 gray  . Cancer     pancreatic cancer  . Hemorrhagic shock 09/14/2014  . Acute GI bleeding 09/14/2014  . Thrombocytopenia 09/16/2014  . 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 Acute respiratory failure with hypoxia   . Bacteremia due to Klebsiella pneumoniae   . Anemia due to acute blood loss 09/26/2014    Assessment: 67 y/oF with PMH of adenocarcinoma of head of pancreas, s/p Whipple's procedure, recurrent Klebsiella bacteremia attributed to intra-abdominal source/presumed septic portal vein thrombus, acute portal vein thrombosis, hepatitis C, tobacco abuse who presents to [facility] ED with complaints of progressive mental status changes, abdominal distention/pain, and poor appetite. Patient has been receiving Unasyn through PICC line, but per ID note 2/23 was to change to Cefuroxime to complete therapy for polymicrobial bacteremia.  Pharmacy consulted today to dose Unasyn for r/o SBP, recurrent Klebsiella bacteremia.  PTA, started 1/31 >> Unasyn >>  Tmax: 97.7 F WBCs: WNL, 10.5 Renal: SCr 0.54, CrCl ~ 59 CG, 83 N (using SCr 0.8)  1/26 blood: Klebsiella  pneumoniae, Streptococcus species 2/24 blood x 2: sent  Goal of Therapy:  Appropriate antibiotic dosing for renal function and indication Eradication of infection  Plan:   Unasyn 3 g IV q6h.  Monitor renal function, cultures, clinical course.   Lindell Spar, PharmD, BCPS Pager: [phone] 11/14/2014 4:54 PM

## 2014-11-14 NOTE — ED Notes (Addendum)
Patient comes from home with c/o altered mental status and ascites.  Patient's family report patient has not been eating or drinking consistently for over a week.  Patient's family deny observing vomiting, diarrhea or fever.    On exam, patient is lethargic.  Patient responds to painful stimuli and c/o being cold.  Patient's bowel sounds are diminished.  Patient has ascites.  Patient's lung sounds clear and diminished.  Patient has 2+ pitting edema to bilateral lower extremities.  No icterus noted.

## 2014-11-14 NOTE — ED Notes (Signed)
Melinda Conrad from lab reports critical potassium of less than 2. RN notified. MD Colin Rhein notified.

## 2014-11-14 NOTE — Telephone Encounter (Signed)
Pt currently in the ED.  Came in the morning.  See ED notes .

## 2014-11-14 NOTE — ED Notes (Signed)
Per pharmacy consult, Unasyn is NOT COMPATIBLE with KCL.

## 2014-11-14 NOTE — ED Notes (Signed)
Pt from home c/o abdominal pain and distention. Hx of Ascites and pancreatic CA. Edematous extremities. Daughter reports that she has been confused x 3 days. Pt had [place] of abdomen today.

## 2014-11-15 ENCOUNTER — Inpatient Hospital Stay (HOSPITAL_COMMUNITY): Payer: Medicaid Other

## 2014-11-15 DIAGNOSIS — G893 Neoplasm related pain (acute) (chronic): Secondary | ICD-10-CM

## 2014-11-15 DIAGNOSIS — B1920 Unspecified viral hepatitis C without hepatic coma: Secondary | ICD-10-CM

## 2014-11-15 DIAGNOSIS — I81 Portal vein thrombosis: Secondary | ICD-10-CM

## 2014-11-15 DIAGNOSIS — C250 Malignant neoplasm of head of pancreas: Secondary | ICD-10-CM

## 2014-11-15 DIAGNOSIS — D630 Anemia in neoplastic disease: Secondary | ICD-10-CM

## 2014-11-15 DIAGNOSIS — R4182 Altered mental status, unspecified: Secondary | ICD-10-CM

## 2014-11-15 DIAGNOSIS — R627 Adult failure to thrive: Secondary | ICD-10-CM

## 2014-11-15 DIAGNOSIS — R188 Other ascites: Secondary | ICD-10-CM

## 2014-11-15 DIAGNOSIS — R109 Unspecified abdominal pain: Secondary | ICD-10-CM | POA: Insufficient documentation

## 2014-11-15 DIAGNOSIS — K7290 Hepatic failure, unspecified without coma: Principal | ICD-10-CM

## 2014-11-15 DIAGNOSIS — R100 Acute abdomen: Secondary | ICD-10-CM

## 2014-11-15 DIAGNOSIS — E43 Unspecified severe protein-calorie malnutrition: Secondary | ICD-10-CM

## 2014-11-15 LAB — PROTEIN, BODY FLUID: Total protein, fluid: <3 g/dL

## 2014-11-15 LAB — COMPREHENSIVE METABOLIC PANEL
ALT: 53 U/L — ABNORMAL HIGH (ref 0–35)
AST: 126 U/L — AB (ref 0–37)
Albumin: 1.3 g/dL — ABNORMAL LOW (ref 3.5–5.2)
Alkaline Phosphatase: 1035 U/L — ABNORMAL HIGH (ref 39–117)
Anion gap: 6 (ref 5–15)
BUN: 11 mg/dL (ref 6–23)
CHLORIDE: 115 mmol/L — AB (ref 96–112)
CO2: 16 mmol/L — AB (ref 19–32)
CREATININE: 0.49 mg/dL — AB (ref 0.50–1.10)
Calcium: 7.1 mg/dL — ABNORMAL LOW (ref 8.4–10.5)
GFR calc Af Amer: >90 mL/min (ref >90)
Glucose, Bld: 73 mg/dL (ref 70–99)
Potassium: 3.1 mmol/L — ABNORMAL LOW (ref 3.5–5.1)
Sodium: 137 mmol/L (ref 135–145)
Total Bilirubin: 5.1 mg/dL — ABNORMAL HIGH (ref 0.3–1.2)
Total Protein: 7.5 g/dL (ref 6.0–8.3)

## 2014-11-15 LAB — BODY FLUID CELL COUNT WITH DIFFERENTIAL
Lymphs, Fluid: 14 %
Monocyte-Macrophage-Serous Fluid: 59 % (ref 50–90)
NEUTROPHIL FLUID: 27 % — AB (ref 0–25)
WBC FLUID: 17 uL (ref 0–1000)

## 2014-11-15 LAB — LACTATE DEHYDROGENASE, PLEURAL OR PERITONEAL FLUID: LD FL: 37 U/L — AB (ref 3–23)

## 2014-11-15 LAB — CBC
HCT: 23.8 % — ABNORMAL LOW (ref 36.0–46.0)
Hemoglobin: 8 g/dL — ABNORMAL LOW (ref 12.0–15.0)
MCH: 27.9 pg (ref 26.0–34.0)
MCHC: 33.6 g/dL (ref 30.0–36.0)
MCV: 82.9 fL (ref 78.0–100.0)
Platelets: 273 10*3/uL (ref 150–400)
RBC: 2.87 MIL/uL — AB (ref 3.87–5.11)
RDW: 21.6 % — AB (ref 11.5–15.5)
WBC: 8.6 10*3/uL (ref 4.0–10.5)

## 2014-11-15 LAB — ALBUMIN, FLUID (OTHER)

## 2014-11-15 LAB — GLUCOSE, SEROUS FLUID: GLUCOSE FL: 74 mg/dL

## 2014-11-15 LAB — AMYLASE, PERITONEAL FLUID: Amylase, peritoneal fluid: 3 U/L

## 2014-11-15 MED ORDER — BOOST / RESOURCE BREEZE PO LIQD
1.0000 | Freq: Three times a day (TID) | ORAL | Status: DC
  Administered 2014-11-15 – 2014-11-28 (×22): 1 via ORAL

## 2014-11-15 MED ORDER — POTASSIUM CHLORIDE 10 MEQ/100ML IV SOLN
10.0000 meq | INTRAVENOUS | Status: AC
  Administered 2014-11-15 (×2): 10 meq via INTRAVENOUS
  Filled 2014-11-15: qty 100

## 2014-11-15 NOTE — Progress Notes (Signed)
Per MD Mikhail, pt may transport to ultrasound on monitor without RN.   Idelle Crouch, RN

## 2014-11-15 NOTE — Evaluation (Signed)
Occupational Therapy Evaluation Patient Details Name: Melinda Conrad MRN: [id number] DOB: [birth year] Today's Date: 11/15/2014    History of Present Illness 64 yo female admitted 11/14/14 with Altered mental status, abdominal distention and pain and poor appetite, patient has h/o pancreatic cancer, portal vein thrombosis,    Clinical Impression   Pt was admitted for the above.  She will benefit from skilled OT to increase activity tolerance to maximize participation in ADLs.  Pt recently needed extensive assistance from significant other.  Goals in acute are for set up to min A for UB adls, transfer to commode and standing for significant other to assist with LB adls    Follow Up Recommendations  Home health OT;Supervision/Assistance - 24 hour;SNF (vs)    Equipment Recommendations   (to be further assessed for 3:1)    Recommendations for Other Services       Precautions / Restrictions Precautions Precautions: Fall Restrictions Weight Bearing Restrictions: No      Mobility Bed Mobility Overal bed mobility: Needs Assistance Bed Mobility: Rolling;Sit to Sidelying   Sidelying to sit: Max assist     Sit to sidelying: Mod assist General bed mobility comments: used rail, multimodal cues for reaching for rail, assist to roll to the side, assist for legs and trunk going to sitting and back to supine,  Transfers             General transfer comment: not tested with OT    Balance Overall balance assessment: Needs assistance Sitting-balance support: Bilateral upper extremity supported;Feet supported Sitting balance-Leahy Scale: Fair                                    ADL Overall ADL's : Needs assistance/impaired Eating/Feeding: NPO   Grooming: Oral care;Minimal assistance;Sitting (used suction to expectorate due to NPO)   Upper Body Bathing: Maximal assistance;Sitting       Upper Body Dressing : Maximal assistance;Sitting                      General ADL Comments: Pt has decreased activity tolerance.  Sat EOB x 10 minutes with supervision.  She needs total A for LB adls.  Did not transfer this session.  Pt had just finished with PT but was willing to sit up     Vision     Perception     Praxis      Pertinent Vitals/Pain Pain Assessment: Faces Faces Pain Scale: Hurts even more Pain Location: abdomen Pain Descriptors / Indicators: Discomfort;Cramping Pain Intervention(s): Limited activity within patient's tolerance;Monitored during session;Premedicated before session;Repositioned     Hand Dominance     Extremity/Trunk Assessment Upper Extremity Assessment Upper Extremity Assessment: Generalized weakness      Cervical / Trunk Assessment Cervical / Trunk Assessment: Normal   Communication Communication Communication: No difficulties   Cognition Arousal/Alertness: Awake/alert   Overall Cognitive Status: Impaired/Different from baseline Area of Impairment: Orientation Orientation Level: Time;Situation   Memory: Decreased short-term memory         General Comments: consistently follows commands; significant other present and corrected PLOF details   General Comments       Exercises       Shoulder Instructions      Home Living Family/patient expects to be discharged to:: Private residence Living Arrangements: Spouse/significant other Available Help at Discharge: Family Type of Home: Apartment (pt reports  apt w/elevator.)  Home Layout: One level     Bathroom Shower/Tub:  (sponge bathes)   Bathroom Toilet:  (bedpan)     Home Equipment: Walker - 2 wheels          Prior Functioning/Environment Level of Independence: Needs assistance        Comments: significant other helps extensively with ADLs.  She has been using bedpan    OT Diagnosis: Generalized weakness   OT Problem List: Decreased strength;Decreased activity tolerance;Decreased cognition;Pain   OT  Treatment/Interventions: Self-care/ADL training;DME and/or AE instruction;Patient/family education;Cognitive remediation/compensation    OT Goals(Current goals can be found in the care plan section) Acute Rehab OT Goals Patient Stated Goal: wants to be able  to get up and walk OT Goal Formulation: With patient/family Time For Goal Achievement: 11/29/14 Potential to Achieve Goals: Good ADL Goals Pt Will Perform Grooming: with set-up;with supervision;sitting Pt Will Transfer to Toilet: with min assist;stand pivot transfer;bedside commode Additional ADL Goal #1: Pt will complete UB adls with min A, seated Additional ADL Goal #2: Pt will go from sit to stand with min A and maintain this for 2 minutes with min guard for adls  OT Frequency: Min 2X/week   Barriers to D/C:            Co-evaluation              End of Session    Activity Tolerance: Patient limited by fatigue Patient left: in bed;with call bell/phone within reach;with bed alarm set   Time: 2162-4469 OT Time Calculation (min): 21 min Charges:  OT General Charges $OT Visit: 1 Procedure OT Evaluation $Initial OT Evaluation Tier I: 1 Procedure G-Codes:    Daud Cayer 12-07-2014, 4:08 PM  Lesle Chris, OTR/L [phone] 2014/12/07

## 2014-11-15 NOTE — Progress Notes (Signed)
[facility]                                                                              Patient Demographics  Melinda Conrad, is a 64 y.o. female, DOB - 07-Sep-1951, VXB:[id number]  Admit date - 11/14/2014   Admitting Physician Modena Jansky, MD  Outpatient Primary MD for the patient is Marlou Sa, ERIC, MD  LOS - 1   Chief Complaint  Patient presents with  . Ascites  . Weakness  . Altered Mental Status      HPI on 11/14/2014 by Dr. [name] Melinda Conrad is a 65 y.o. female with history of adenocarcinoma of head of pancreas, status post Whipple's procedure 08/07/14, completed concurrent capecitabine and radiation, poor performance status and hence did not get adjuvant chemotherapy, recurrent Klebsiella bacteremia- attributed to intra-abdominal source/presumed septic portal vein thrombus, acute portal vein thrombosis-not on anticoagulation secondary to GI bleed, tobacco abuse & hepatitis C presented to the [facility] ED on 11/14/14 with complaints of progressive mental status changes, abdominal distention/pain and poor appetite. Patient unable to provide history secondary to AMS. No family at bedside currently. History obtained from EDP who discussed with family. Patient apparently has been having progressive failure to thrive since Whipple's procedure. However over the last 3 days, family have noticed gradually worsening confusion, disorientation and lethargic to a point of near unresponsiveness today, decreased oral intake of liquids and food but no nausea or vomiting, progressive abdominal and lower extremity swelling with intermittent abdominal pain and no fevers. Patient has been getting IV antibiotics through right upper extremity PICC line and therefore plans to remove PICC line and from IV Unasyn to oral cefuroxime. In the ED, patient is somnolent but arousable, afebrile, elevated blood pressure, lab work significant for potassium <2, abnormal LFTs, ammonia 120,  hemoglobin 8.5, CT head without acute findings, CT abdomen and pelvis: Diffuse portal vein system thrombosis, anasarca, diffuse moderate to severe bowel wall thickening, moderate bilateral pleural effusions and fluid filled and dilated small bowel loop draining the pancreas. Hospitalist admission was requested.  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/hepatitis C -Improving -Patient was given lactulose enema -CT of the head: No acute findings -Gastroenterology consulted and appreciated -Once patient is able to tolerate by mouth's, will add on rifaximin  Anasarca/ascites -Secondary to liver disease, portal vein thrombosis and hypoalbuminemia with malnutrition -Interventional radiology consult appreciated -Patient had ultrasound-guided paracentesis with removal of 3 L -Patient empirically started on IV Unasyn to cover for possible SBP -Fluid cytology and culture pending  Severe hypokalemia -Magnesium 1.9 -Continue to replace and monitor BMP  Dehydration -Secondary to poor oral intake and continue IV fluids  Portal vein thrombosis -Patient has had history of GI bleed is currently not anticoagulation candidate  Anemia secondary to chronic disease -Baseline approximately 8 -Will transfuse if below 7  Hepatitis C -Currently not on treatment  Severe protein calorie malnutrition/failure to thrive -Nutrition consulted and appreciated -Patient started on clear liquid diet with nutritional supplementation  Pancreatic adenocarcinoma -S/p Whipple's procedure -Oncology consulted and appreciated  ?Small bowel ileus -will start patient on   Code Status: Full  Family Communication: None at bedside  Disposition Plan: Admitted, pending further workup.   Time Spent in minutes   30 minutes  Procedures  Ultrasound guided paracentesis  Consults   Interventional radiology Gastroenterology General surgery Oncology  DVT Prophylaxis  SCDs  Lab  Results  Component Value Date   PLT 273 11/15/2014    Medications  Scheduled Meds: . ampicillin-sulbactam (UNASYN) IV  3 g Intravenous 4 times per day  . antiseptic oral rinse  7 mL Mouth Rinse q12n4p  . chlorhexidine  15 mL Mouth Rinse BID  . feeding supplement (RESOURCE BREEZE)  1 Container Oral TID BM  . pantoprazole (PROTONIX) IV  40 mg Intravenous Q24H  . sodium chloride  3 mL Intravenous Q12H   Continuous Infusions: . 0.9 % NaCl with KCl 40 mEq / L 75 mL/hr (11/15/14 1041)   PRN Meds:.acetaminophen, albuterol, hydrALAZINE, morphine injection, ondansetron (ZOFRAN) IV  Antibiotics    Anti-infectives    Start     Dose/Rate Route Frequency Ordered Stop   11/15/14 0000  Ampicillin-Sulbactam (UNASYN) 3 g in sodium chloride 0.9 % 100 mL IVPB     3 g 100 mL/hr over 60 Minutes Intravenous 4 times per day 11/14/14 1616     11/14/14 1615  Ampicillin-Sulbactam (UNASYN) 3 g in sodium chloride 0.9 % 100 mL IVPB     3 g 100 mL/hr over 60 Minutes Intravenous STAT 11/14/14 1609 11/14/14 1827   11/14/14 1600  ampicillin-sulbactam (UNASYN) 1.5 g in sodium chloride 0.9 % 50 mL IVPB  Status:  Discontinued     1.5 g 100 mL/hr over 30 Minutes Intravenous  Once 11/14/14 1527 11/14/14 1609        Subjective:   [name] seen and examined today.  Patient currently awake and knows she is in the hospital, but remains confused.  Continues to complain of abdominal pain.     Objective:   Filed Vitals:   11/15/14 1140 11/15/14 1210 11/15/14 1215 11/15/14 1220  BP: 156/77 142/82 143/78 137/77  Pulse:      Temp:      TempSrc:      Resp:      Height:      Weight:      SpO2:        Wt Readings from Last 3 Encounters:  11/14/14 57.2 kg (126 lb 1.7 oz)  10/29/14 50.395 kg (111 lb 1.6 oz)  10/17/14 45.6 kg (100 lb 8.5 oz)     Intake/Output Summary (Last 24 hours) at 11/15/14 1546 Last data filed at 11/15/14 1400  Gross per 24 hour  Intake 1556.25 ml  Output    785 ml  Net  771.25 ml    Exam  General: Well developed, thin, NAD  HEENT: NCAT, mucous membranes dry  Cardiovascular: S1 S2 auscultated, no rubs, murmurs or gallops. Regular rate and rhythm.  Respiratory: Diminished breath sounds  Abdomen: Firm, distended, diffusely tender, hypoactive bowel sounds  Extremities: warm dry without cyanosis clubbing. +LE edema B/L  Neuro: Awake and alert, oriented to self and place.  Remains confused.  Able to follow commands.   Data Review   Micro Results Recent Results (from the past 240 hour(s))  Blood culture (routine x 2)     Status: None (Preliminary result)   Collection Time: 11/14/14 12:30 PM  Result Value Ref Range Status   Specimen Description BLOOD RIGHT HAND  Final   Special Requests BOTTLES DRAWN AEROBIC ONLY 4CC  Final   Culture   Final  BLOOD CULTURE RECEIVED NO GROWTH TO DATE CULTURE WILL BE HELD FOR 5 DAYS BEFORE ISSUING A FINAL NEGATIVE REPORT Performed at [facility]    Report Status PENDING  Incomplete  MRSA PCR Screening     Status: None   Collection Time: 11/14/14  5:15 PM  Result Value Ref Range Status   MRSA by PCR NEGATIVE NEGATIVE Final    Comment:        The GeneXpert MRSA Assay (FDA approved for NASAL specimens only), is one component of a comprehensive MRSA colonization surveillance program. It is not intended to diagnose MRSA infection nor to guide or monitor treatment for MRSA infections.     Radiology Reports Ct Head Wo Contrast  11/14/2014   CLINICAL DATA:  Altered mental status  EXAM: CT HEAD WITHOUT CONTRAST  TECHNIQUE: Contiguous axial images were obtained from the base of the skull through the vertex without intravenous contrast.  COMPARISON:  October 27, 2005  FINDINGS: The ventricles are normal in size and configuration. There is no mass, hemorrhage, extra-axial fluid collection, or midline shift. Gray-white compartments are normal. No acute infarct apparent. The bony calvarium appears  intact. The mastoid air cells are clear.  IMPRESSION: No intracranial mass, hemorrhage, or focal gray -white compartment lesions/acute appearing infarct.   Electronically Signed   By: Lowella Grip III M.D.   On: 11/14/2014 14:17   US Abdomen Complete  11/14/2014   CLINICAL DATA:  Pancreatic cancer post Whipple procedure 08/07/2014, abdominal swelling for 1 week, prior cholecystectomy, hypertension, radiation therapy, smoker  EXAM: ULTRASOUND ABDOMEN COMPLETE  COMPARISON:  CT abdomen and pelvis 10/16/2014  FINDINGS: Gallbladder: Surgically absent  Common bile duct: Diameter: 5 mm diameter, normal  Liver: Heterogeneous in echogenicity. No discrete mass. Portal vein is not well demonstrated ; portal vein demonstrated thrombosis on the prior CT.  IVC: Intrahepatic portion normal appearance. Infrahepatic portion nonvisualized due to bowel gas.  Pancreas: Post Whipple procedure by history. Pancreatic bed inadequately visualized due to bowel gas.  Spleen: Normal appearance, 5.0 cm length  Right Kidney: Length: 9.3 cm. Normal morphology without mass or hydronephrosis.  Left Kidney: Length: 10.0 cm. Normal morphology without mass or hydronephrosis.  Abdominal aorta: Proximally normal caliber, remainder obscured by bowel gas  Other findings: Significant ascites throughout abdomen and pelvis.  IMPRESSION: Significant ascites significantly increased since prior CT exam, likely cause of abdominal swelling.  Limited exam due to bowel gas, with inadequate visualization of the pancreatic bed, portal vein and portions of the aorta.  Post cholecystectomy.   Electronically Signed   By: Lavonia Dana M.D.   On: 11/14/2014 14:32   Ct Abdomen Pelvis W Contrast  11/14/2014   CLINICAL DATA:  64 year old female with abdominal pain and distension. Current history of pancreatic cancer status post Whipple and radiation. Subsequent encounter.  EXAM: CT ABDOMEN AND PELVIS WITH CONTRAST  TECHNIQUE: Multidetector CT imaging of the abdomen  and pelvis was performed using the standard protocol following bolus administration of intravenous contrast.  CONTRAST:  162mL OMNIPAQUE IOHEXOL 300 MG/ML  SOLN  COMPARISON:  10/16/2014 and earlier.  FINDINGS: New moderate size bilateral pleural effusions with lower lobe atelectasis. No pericardial effusion.  Advanced degenerative changes in the lower lumbar spine. No acute osseous abnormality identified.  Increased body wall stranding and density in keeping with a degree of anasarca. Moderate to large volume of ascites in the abdomen has significantly progressed, and in the upper abdomen this is associated with new lobular mass effect on the liver  contour (series 2, image 17).  No dilated large bowel. Widespread small and large bowel wall thickening. Pancreatic or biliary stent re- identified within a small bowel loop in the right upper quadrant which today is dilated with fluid up to 4 cm diameter. There is continued intra and extrahepatic biliary ductal dilatation. Pancreatic tail atrophy again noted.  In January of the main portal vein was thrombosed. On today images no portal venous system enhancement is identified.  The stomach is thick walled. Oral contrast was administered, and has reached mid small bowel loops.  Major arterial structures in the abdomen and pelvis appear patent. Aortoiliac calcified atherosclerosis noted. Renal enhancement and contrast excretion is within normal limits.  IMPRESSION: 1. Progression of disease. Progressed and now diffuse portal venous system thrombosis since January. 2. Anasarca. Large volume ascites, mass effect on the liver may reflect subcapsular or loculated ascites. Diffuse moderate to severe bowel wall thickening. Moderate bilateral pleural effusions. 3. Fluid-filled and dilated small bowel loop draining the pancreas.   Electronically Signed   By: Genevie Ann M.D.   On: 11/14/2014 14:42   Ct Abdomen Pelvis W Contrast  10/16/2014   CLINICAL DATA:  Fever, lower pelvic pain,  history of pancreatic cancer status post Whipple  EXAM: CT ABDOMEN AND PELVIS WITH CONTRAST  TECHNIQUE: Multidetector CT imaging of the abdomen and pelvis was performed using the standard protocol following bolus administration of intravenous contrast.  CONTRAST:  184mL OMNIPAQUE IOHEXOL 300 MG/ML  SOLN  COMPARISON:  09/26/2014  FINDINGS: Postsurgical changes related to Whipple procedure.  Lower chest:  Mild dependent atelectasis at the lung bases.  Hepatobiliary: Heterogeneous perfusion of the liver on the arterial phase, although this improves on the delayed phase.  Status post cholecystectomy. No intrahepatic or extrahepatic ductal dilatation.  Pancreas: Partial pancreatectomy with atrophy of the residual pancreatic tail. Prior pancreatic stent is now located entirely within small bowel (series 2/image 26).  Spleen: Within normal limits.  Adrenals/Urinary Tract: Adrenal glands are unremarkable.  Kidneys are within normal limits.  No hydronephrosis.  Bladder is unremarkable.  Stomach/Bowel: Partial gastrectomy.  Patent gastrojejunostomy.  No evidence of bowel obstruction.  Right colon is mildly thick-walled (series 2/image 50), nonspecific, but raising the possibility of infectious/inflammatory colitis. Rectosigmoid colon is mildly thick-walled but underdistended.  Vascular/Lymphatic: Atherosclerotic calcifications of the abdominal aorta and branch vessels.  Thrombosis of the main portal vein (series 2/ image 25).  Soft tissue/nodularity along the hepatoduodenal ligament (series 2/ image 21), likely reflecting radiation/postsurgical changes, although a discrete 11 mm gastrohepatic node is possible.  Reproductive: Uterus and bilateral ovaries are grossly unremarkable.  Other: Small volume pelvic ascites.  Musculoskeletal: Degenerative changes of the visualized thoracolumbar spine, most prominent at L5-S1.  IMPRESSION: Postsurgical changes related to Whipple procedure, as above. Please note that the prior pancreatic  stent has now migrated into the small bowel.  Portal vein thrombosis. Suspected associated heterogeneous perfusion of the liver.  Soft tissue/nodularity along the hepatoduodenal ligament is favored to reflect radiation/postsurgical changes, although a discrete 11 mm gastrohepatic node is not excluded.  Mild wall thickening of the right colon, possibly reflecting infectious/inflammatory colitis.   Electronically Signed   By: Julian Hy M.D.   On: 10/16/2014 18:27   US Abdomen Limited Ruq  10/16/2014   CLINICAL DATA:  Elevated alkaline phosphatase  EXAM: US ABDOMEN LIMITED - RIGHT UPPER QUADRANT  COMPARISON:  CT from earlier in the same day  FINDINGS: Gallbladder:  Surgically removed  Common bile duct:  Not well  visualized likely related to the recent Whipple procedure.  Liver:  Diffuse heterogeneity of the liver is noted consistent with that seen on the recent CT examination. The portal vein is again poorly visualized consistent with thrombosis as previously described. Hepatic artery is patent.  IMPRESSION: Postsurgical changes.  Thrombosis of the portal vein similar to that seen on recent CT examination.  Heterogeneous liver consistent with that seen on the recent CT examination likely related to the portal vein thrombosis.   Electronically Signed   By: Inez Catalina M.D.   On: 10/16/2014 21:43    CBC  Recent Labs Lab 11/14/14 1154 11/15/14 0857  WBC 10.5 8.6  HGB 8.5* 8.0*  HCT 26.0* 23.8*  PLT 279 273  MCV 85.5 82.9  MCH 28.0 27.9  MCHC 32.7 33.6  RDW 20.8* 21.6*    Chemistries   Recent Labs Lab 11/14/14 1154 11/15/14 0200  NA 139 137  K <2.0* 3.1*  CL 108 115*  CO2 21 16*  GLUCOSE 101* 73  BUN 12 11  CREATININE 0.54 0.49*  CALCIUM 7.7* 7.1*  MG 1.9  --   AST 128* 126*  ALT 53* 53*  ALKPHOS 1216* 1035*  BILITOT 4.9* 5.1*   ------------------------------------------------------------------------------------------------------------------ estimated creatinine clearance  is 61.1 mL/min (by C-G formula based on Cr of 0.49). ------------------------------------------------------------------------------------------------------------------ No results for input(s): HGBA1C in the last 72 hours. ------------------------------------------------------------------------------------------------------------------ No results for input(s): CHOL, HDL, LDLCALC, TRIG, CHOLHDL, LDLDIRECT in the last 72 hours. ------------------------------------------------------------------------------------------------------------------ No results for input(s): TSH, T4TOTAL, T3FREE, THYROIDAB in the last 72 hours.  Invalid input(s): FREET3 ------------------------------------------------------------------------------------------------------------------ No results for input(s): VITAMINB12, FOLATE, FERRITIN, TIBC, IRON, RETICCTPCT in the last 72 hours.  Coagulation profile  Recent Labs Lab 11/14/14 1153  INR 1.60*    No results for input(s): DDIMER in the last 72 hours.  Cardiac Enzymes No results for input(s): CKMB, TROPONINI, MYOGLOBIN in the last 168 hours.  Invalid input(s): CK ------------------------------------------------------------------------------------------------------------------ Invalid input(s): POCBNP    Vania Rosero D.O. on 11/15/2014 at 3:46 PM  Between 7am to 7pm - Pager - [phone]  After 7pm go to [email or web address] - password [email or web address]  And look for the night coverage person covering for me after hours  [facility] Office  [phone]

## 2014-11-15 NOTE — Evaluation (Signed)
Physical Therapy Evaluation Patient Details Name: Melinda Conrad MRN: [id number] DOB: 10-06-1950 Today's Date: 11/15/2014   History of Present Illness  64 yo female admitted 11/14/14 with Altered mental status, abdominal distention and pain and poor appetite, patient has h/o pancreatic cancer, portal vein thrombosis,   Clinical Impression  Patient is very weak but tolerated standing at the bedside wit 2 person assist. Patient will benefit from PT to address problems listed in note below.    Follow Up Recommendations Home health PT;Supervision/Assistance - 24 hour (if returns  enough strength and  functioonal mobility)    Equipment Recommendations  Wheelchair (measurements PT)    Recommendations for Other Services       Precautions / Restrictions Precautions Precautions: Fall Restrictions Weight Bearing Restrictions: No      Mobility  Bed Mobility Overal bed mobility: Needs Assistance Bed Mobility: Rolling;Sit to Sidelying   Sidelying to sit: Max assist     Sit to sidelying: Mod assist General bed mobility comments: used rail, multimodal cues for reaching for rail, assist to roll to the side, assist for legs and trunk going to sitting and back to supine,  Transfers Overall transfer level: Needs assistance Equipment used: 2 person hand held assist Transfers: Sit to/from Stand Sit to Stand: Mod assist;+2 physical assistance;+2 safety/equipment;From elevated surface         General transfer comment: not tested with OT  Ambulation/Gait Ambulation/Gait assistance: Mod assist;+2 physical assistance;+2 safety/equipment   Assistive device: 2 person hand held assist       General Gait Details: 4 shuffle side asteps to move along  the edge of the bed.  Stairs            Wheelchair Mobility    Modified Rankin (Stroke Patients Only)       Balance Overall balance assessment: Needs assistance Sitting-balance support: Bilateral upper extremity supported;Feet  supported Sitting balance-Leahy Scale: Fair     Standing balance support: During functional activity;Bilateral upper extremity supported Standing balance-Leahy Scale: Poor                               Pertinent Vitals/Pain Pain Assessment: Faces Faces Pain Scale: Hurts even more Pain Location: abdomen Pain Descriptors / Indicators: Discomfort;Cramping Pain Intervention(s): Limited activity within patient's tolerance;Monitored during session;Premedicated before session;Repositioned    Home Living Family/patient expects to be discharged to:: Private residence Living Arrangements: Spouse/significant other Available Help at Discharge: Family Type of Home: Apartment (pt reports  apt w/elevator.)       Home Layout: One level Home Equipment: Walker - 2 wheels      Prior Function Level of Independence: Needs assistance         Comments: significant other helps extensively with ADLs.  She has been using bedpan     Hand Dominance        Extremity/Trunk Assessment   Upper Extremity Assessment: Generalized weakness           Lower Extremity Assessment: RLE deficits/detail;LLE deficits/detail RLE Deficits / Details: grossly 3+/5 bil    Cervical / Trunk Assessment: Normal  Communication   Communication: No difficulties  Cognition Arousal/Alertness: Awake/alert   Overall Cognitive Status: Impaired/Different from baseline Area of Impairment: Orientation Orientation Level: Time;Situation   Memory: Decreased short-term memory         General Comments: consistently follows commands; significant other present and corrected PLOF details    General Comments      Exercises  Assessment/Plan    PT Assessment Patient needs continued PT services  PT Diagnosis Generalized weakness;Acute pain   PT Problem List Decreased strength;Decreased activity tolerance;Decreased balance;Decreased mobility;Decreased knowledge of precautions;Decreased safety  awareness;Decreased knowledge of use of DME;Pain  PT Treatment Interventions DME instruction;Gait training;Functional mobility training;Therapeutic activities;Patient/family education;Balance training;Therapeutic exercise;Stair training   PT Goals (Current goals can be found in the Care Plan section) Acute Rehab PT Goals Patient Stated Goal: wants to be able  to get up and walk PT Goal Formulation: With patient/family Time For Goal Achievement: 11/29/14 Potential to Achieve Goals: Good    Frequency Min 3X/week   Barriers to discharge        Co-evaluation               End of Session   Activity Tolerance: Patient limited by fatigue;Patient tolerated treatment well Patient left: in bed;with call bell/phone within reach;with bed alarm set Nurse Communication: Mobility status         Time: 1537-9432 PT Time Calculation (min) (ACUTE ONLY): 15 min   Charges:   PT Evaluation $Initial PT Evaluation Tier I: 1 Procedure     PT G CodesClaretha Cooper 11/15/2014, 4:07 PM Tresa Endo PT [phone]

## 2014-11-15 NOTE — Progress Notes (Signed)
IP PROGRESS NOTE  Subjective:   [name] is known to me with a history of pancreas cancer. She was admitted yesterday with an altered mental status and increased abdominal distention. She is alert this morning, but confused.  Objective: Vital signs in last 24 hours: Blood pressure 137/77, pulse 77, temperature 98.6 F (37 C), temperature source Oral, resp. rate 14, height 5\' 3"  (1.6 m), weight 126 lb 1.7 oz (57.2 kg), SpO2 98 %.  Intake/Output from previous day: 02/24 0701 - 02/25 0700 In: 1126.3 [I.V.:726.3; IV Piggyback:400] Out: 463 [Urine:463]  Physical Exam:  HEENT: Scleral icterus Lungs: Clear anteriorly Cardiac: Regular rate and rhythm Abdomen: Markedly distended with ascites Extremities: Pitting edema at the ankles bilaterally Neurologic: Alert, follows some commands, not oriented  Portacath/PICC-without erythema  Lab Results:  Recent Labs  11/14/14 1154 11/15/14 0857  WBC 10.5 8.6  HGB 8.5* 8.0*  HCT 26.0* 23.8*  PLT 279 273    BMET  Recent Labs  11/14/14 1154 11/15/14 0200  NA 139 137  K <2.0* 3.1*  CL 108 115*  CO2 21 16*  GLUCOSE 101* 73  BUN 12 11  CREATININE 0.54 0.49*  CALCIUM 7.7* 7.1*    Studies/Results: Ct Head Wo Contrast  11/14/2014   CLINICAL DATA:  Altered mental status  EXAM: CT HEAD WITHOUT CONTRAST  TECHNIQUE: Contiguous axial images were obtained from the base of the skull through the vertex without intravenous contrast.  COMPARISON:  October 27, 2005  FINDINGS: The ventricles are normal in size and configuration. There is no mass, hemorrhage, extra-axial fluid collection, or midline shift. Gray-white compartments are normal. No acute infarct apparent. The bony calvarium appears intact. The mastoid air cells are clear.  IMPRESSION: No intracranial mass, hemorrhage, or focal gray -white compartment lesions/acute appearing infarct.   Electronically Signed   By: Lowella Grip III M.D.   On: 11/14/2014 14:17   US Abdomen  Complete  11/14/2014   CLINICAL DATA:  Pancreatic cancer post Whipple procedure 08/07/2014, abdominal swelling for 1 week, prior cholecystectomy, hypertension, radiation therapy, smoker  EXAM: ULTRASOUND ABDOMEN COMPLETE  COMPARISON:  CT abdomen and pelvis 10/16/2014  FINDINGS: Gallbladder: Surgically absent  Common bile duct: Diameter: 5 mm diameter, normal  Liver: Heterogeneous in echogenicity. No discrete mass. Portal vein is not well demonstrated ; portal vein demonstrated thrombosis on the prior CT.  IVC: Intrahepatic portion normal appearance. Infrahepatic portion nonvisualized due to bowel gas.  Pancreas: Post Whipple procedure by history. Pancreatic bed inadequately visualized due to bowel gas.  Spleen: Normal appearance, 5.0 cm length  Right Kidney: Length: 9.3 cm. Normal morphology without mass or hydronephrosis.  Left Kidney: Length: 10.0 cm. Normal morphology without mass or hydronephrosis.  Abdominal aorta: Proximally normal caliber, remainder obscured by bowel gas  Other findings: Significant ascites throughout abdomen and pelvis.  IMPRESSION: Significant ascites significantly increased since prior CT exam, likely cause of abdominal swelling.  Limited exam due to bowel gas, with inadequate visualization of the pancreatic bed, portal vein and portions of the aorta.  Post cholecystectomy.   Electronically Signed   By: Lavonia Dana M.D.   On: 11/14/2014 14:32   Ct Abdomen Pelvis W Contrast  11/14/2014   CLINICAL DATA:  64 year old female with abdominal pain and distension. Current history of pancreatic cancer status post Whipple and radiation. Subsequent encounter.  EXAM: CT ABDOMEN AND PELVIS WITH CONTRAST  TECHNIQUE: Multidetector CT imaging of the abdomen and pelvis was performed using the standard protocol following bolus administration of intravenous  contrast.  CONTRAST:  11mL OMNIPAQUE IOHEXOL 300 MG/ML  SOLN  COMPARISON:  10/16/2014 and earlier.  FINDINGS: New moderate size bilateral pleural  effusions with lower lobe atelectasis. No pericardial effusion.  Advanced degenerative changes in the lower lumbar spine. No acute osseous abnormality identified.  Increased body wall stranding and density in keeping with a degree of anasarca. Moderate to large volume of ascites in the abdomen has significantly progressed, and in the upper abdomen this is associated with new lobular mass effect on the liver contour (series 2, image 17).  No dilated large bowel. Widespread small and large bowel wall thickening. Pancreatic or biliary stent re- identified within a small bowel loop in the right upper quadrant which today is dilated with fluid up to 4 cm diameter. There is continued intra and extrahepatic biliary ductal dilatation. Pancreatic tail atrophy again noted.  In January of the main portal vein was thrombosed. On today images no portal venous system enhancement is identified.  The stomach is thick walled. Oral contrast was administered, and has reached mid small bowel loops.  Major arterial structures in the abdomen and pelvis appear patent. Aortoiliac calcified atherosclerosis noted. Renal enhancement and contrast excretion is within normal limits.  IMPRESSION: 1. Progression of disease. Progressed and now diffuse portal venous system thrombosis since January. 2. Anasarca. Large volume ascites, mass effect on the liver may reflect subcapsular or loculated ascites. Diffuse moderate to severe bowel wall thickening. Moderate bilateral pleural effusions. 3. Fluid-filled and dilated small bowel loop draining the pancreas.   Electronically Signed   By: Genevie Ann M.D.   On: 11/14/2014 14:42    Medications: I have reviewed the patient's current medications.  Assessment/Plan:  1. Pancreas cancer-clinical stage II a (T3 N0), status post an ERCP brush of the common bile duct stricture 03/06/2014 confirming adenocarcinoma  EUS 04/05/2012 consistent with a uT3N0 lesion, ultrasound evidence for invasion of the portal  vein   Staging MRI of the abdomen 03/05/2014 confirmed a pancreas mass abutting the undersurface of the proximal main portal vein   Initiation of concurrent capecitabine and radiation on 04/30/2014; completed 06/08/2014.  Pancreaticoduodenectomy 08/07/2014 confirmed a pathologic ypT3,ypN1 tumor with negative surgical margins 2. constipation-likely secondary to narcotic analgesics, improved  3. anorexia/weight loss  4. tobacco and alcohol use  5. history of a colon polyp in February 2015  6. depression 7. Pain secondary to pancreas cancer-improved 8. Klebsiella bacteremia 09/14/2014; readmitted 10/16/2014 with recurrent Klebsiella bacteremia 9. Respiratory failure/bilateral airspace disease 10. Acute portal vein thrombosis 09/16/2014. Not on anticoagulation due to gastrointestinal bleed. 11. Acute thrombocytopenia during hospitalization 09/14/2014. Resolved. 12. GI bleeding 09/14/2014 13. Hepatitis C positive 14. Hepatic encephalopathy 11/14/2014  She is admitted with an altered mental status, failure to thrive, and progressive ascites. The ascites is most likely secondary to the portal vein thrombosis, hypoalbuminemia, and underlying liver disease. I doubt her presentation is related to progression of pancreas cancer, but this is possible. Our plan was to deliver adjuvant gemcitabine chemotherapy following the Whipple procedure, but she was unable to complete adjuvant therapy secondary to multiple post Whipple complications. Her for a long-term survival from pancreas cancer is poor, but it is possible she has undergone curative surgery.  Recommendations: 1. Management of hepatic encephalopathy and portal hypertension per gastroenterology 2. Check ascitic fluid cytology 3. Please call oncology as needed  LOS: 1 day   Mortimer Bair  11/15/2014, 1:49 PM

## 2014-11-15 NOTE — Consult Note (Signed)
Referring Provider: [facility] Primary Care Physician:  Kevan Ny, MD Primary Gastroenterologist:  Dr. Carlean Purl  Reason for Consultation:  Pancreatic ca s/p whipple, elevated alk phos     HPI: Melinda Conrad is a 64 y.o. female who underwent a Whipple's procedure in November 2015 due to adenocarcinoma of the head of the pancreas. She completed concurrent chemoradiation but did poorly and did not get adjuvant chemotherapy. She has had recurrent Klebsiella bacteremia which was thought to be due to an intra-abdominal source likely septic portal vein thrombosis. She is not on anticoagulation due to GI bleeds. She has a history of hepatitis C and tobacco abuse. She was brought to the [facility] emergency room yesterday with mental status changes, increased abdominal pain and distention and anorexia. Patient has been doing poorly since her surgery in November but over the past 3-4 days prior to this admission her family noticed that she was becoming increasingly lethargic and disoriented and confused and was nearly unresponsive yesterday. She has not exhibited any nausea or vomiting but they did note that she had increased swelling of her abdomen and lower extremities. She had been getting IV antibiotics through a PICC line in her right arm. Her alkaline phosphatase was noted to be elevated as an outpatient and it was felt to be due to her pancreatic cancer. In the emergency room the patient was noted to be lethargic. Ammonia was 120, hemoglobin 8.5, CT of the head had no acute findings. She had a CT of the abdomen and pelvis that showed portal vein system thrombosis, anasarca, diffuse moderate to severe bowel wall thickening, as well as bilateral pleural effusions and fluid-filled and dilated small bowel loops straightening the pancreas.   Past Medical History  Diagnosis Date  . Arthritis   . Depression   . H/O: substance abuse   . Back pain   . Personal history of colonic polyp -  adenoma 10/26/2013    10/26/2013 diminutive sigmoid polyp removed    . Pancreatitis, acute   . Hypertension   . Osteoporosis   . Radiation 04/30/14-06/08/14    pancreas 50 gray  . Cancer     pancreatic cancer  . Hemorrhagic shock 09/14/2014  . Acute GI bleeding 09/14/2014  . Thrombocytopenia 09/16/2014  . 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 Acute respiratory failure with hypoxia   . Bacteremia due to Klebsiella pneumoniae   . Anemia due to acute blood loss 09/26/2014    Past Surgical History  Procedure Laterality Date  . Lumbar epidural injection    . Colonoscopy    . Ercp N/A 03/06/2014    Procedure: ENDOSCOPIC RETROGRADE CHOLANGIOPANCREATOGRAPHY (ERCP);  Surgeon: Gatha Mayer, MD;  Location: [facility] ENDOSCOPY;  Service: Endoscopy;  Laterality: N/A;  . Eus N/A 04/05/2014    Procedure: UPPER ENDOSCOPIC ULTRASOUND (EUS) RADIAL;  Surgeon: [name], MD;  Location: [facility] ENDOSCOPY;  Service: Endoscopy;  Laterality: N/A;  . Whipple procedure N/A 08/07/2014    Procedure:  DIAGNOSTIC LAPAROSCOPY, WHIPPLE PROCEDURE;  Surgeon: Stark Klein, MD;  Location: [facility] ORS;  Service: General;  Laterality: N/A;  . Esophagogastroduodenoscopy N/A 09/28/2014    Procedure: ESOPHAGOGASTRODUODENOSCOPY (EGD);  Surgeon: Ladene Artist, MD;  Location: [facility] ENDOSCOPY;  Service: Endoscopy;  Laterality: N/A;    Prior to Admission medications   Medication Sig Start Date End Date Taking? Authorizing Provider  antiseptic oral rinse (CPC / CETYLPYRIDINIUM CHLORIDE 0.05%) 0.05 % LIQD solution 7 mLs by Mouth Rinse route 2 (two) times daily. 10/09/14  Yes Robbie Lis, MD  CALCIUM-VITAMIN D PO Take 1 capsule by mouth daily.   Yes Historical Provider, MD  feeding supplement, ENSURE COMPLETE, (ENSURE COMPLETE) LIQD Take 237 mLs by mouth 3 (three) times daily between meals. 10/09/14  Yes Robbie Lis, MD  megestrol (MEGACE ORAL) 40 MG/ML suspension Take 5 mLs (200 mg total) by mouth 2 (two) times daily. 10/29/14  Yes Owens Shark, NP   ondansetron (ZOFRAN) 4 MG tablet Take 1 tablet (4 mg total) by mouth every 8 (eight) hours as needed for nausea or vomiting. 10/09/14  Yes Robbie Lis, MD  Ampicillin-Sulbactam 3 g in sodium chloride 0.9 % 100 mL Inject 3 g into the vein every 6 (six) hours. Patient not taking: Reported on 11/14/2014 10/23/14   Venetia Maxon Rama, MD  fentaNYL (DURAGESIC - DOSED MCG/HR) 25 MCG/HR patch Place 1 patch (25 mcg total) onto the skin every 3 (three) days. Patient not taking: Reported on 11/14/2014 10/09/14   Robbie Lis, MD  oxyCODONE-acetaminophen (PERCOCET/ROXICET) 5-325 MG per tablet Take 1-2 tablets by mouth every 4 (four) hours as needed for moderate pain. Patient not taking: Reported on 11/14/2014 10/29/14   Owens Shark, NP  pantoprazole (PROTONIX) 40 MG tablet Take 1 tablet (40 mg total) by mouth 2 (two) times daily as needed (heartburn). Patient not taking: Reported on 11/14/2014 10/23/14   Venetia Maxon Rama, MD  pregabalin (LYRICA) 25 MG capsule Take 1 capsule (25 mg total) by mouth at bedtime. Patient not taking: Reported on 11/14/2014 10/09/14   Robbie Lis, MD  prochlorperazine (COMPAZINE) 5 MG tablet TAKE ONE TO TWO TABS EVERY SIX HOURS PRN. Patient not taking: Reported on 11/14/2014 10/11/14   Ladell Pier, MD  sucralfate (CARAFATE) 1 GM/10ML suspension Take 10 mLs (1 g total) by mouth 4 (four) times daily -  with meals and at bedtime. Patient not taking: Reported on 11/14/2014 10/09/14   Robbie Lis, MD    Current Facility-Administered Medications  Medication Dose Route Frequency Provider Last Rate Last Dose  . 0.9 % NaCl with KCl 40 mEq / L  infusion   Intravenous Continuous Modena Jansky, MD 75 mL/hr at 11/15/14 0600    . acetaminophen (TYLENOL) suppository 650 mg  650 mg Rectal Q6H PRN Modena Jansky, MD      . albuterol (PROVENTIL) (2.5 MG/3ML) 0.083% nebulizer solution 2.5 mg  2.5 mg Nebulization Q2H PRN Modena Jansky, MD      . Ampicillin-Sulbactam (UNASYN) 3 g in sodium  chloride 0.9 % 100 mL IVPB  3 g Intravenous 4 times per day Luiz Ochoa, RPH   3 g at 11/15/14 0449  . antiseptic oral rinse (CPC / CETYLPYRIDINIUM CHLORIDE 0.05%) solution 7 mL  7 mL Mouth Rinse q12n4p Modena Jansky, MD   7 mL at 11/14/14 1730  . chlorhexidine (PERIDEX) 0.12 % solution 15 mL  15 mL Mouth Rinse BID Modena Jansky, MD   15 mL at 11/14/14 2019  . hydrALAZINE (APRESOLINE) injection 5 mg  5 mg Intravenous Q6H PRN Modena Jansky, MD   5 mg at 11/14/14 2030  . morphine 2 MG/ML injection 1 mg  1 mg Intravenous Q3H PRN Gardiner Barefoot, NP   1 mg at 11/15/14 0448  . ondansetron (ZOFRAN) injection 4 mg  4 mg Intravenous Q6H PRN Modena Jansky, MD   4 mg at 11/15/14 0448  . pantoprazole (PROTONIX) injection 40 mg  40 mg  Intravenous Q24H Modena Jansky, MD   40 mg at 11/14/14 1738  . sodium chloride 0.9 % injection 3 mL  3 mL Intravenous Q12H Modena Jansky, MD   3 mL at 11/14/14 2312    Allergies as of 11/14/2014  . (No Known Allergies)    Family History  Problem Relation Age of Onset  . Colon cancer Neg Hx   . Esophageal cancer Neg Hx   . Rectal cancer Neg Hx   . Stomach cancer Neg Hx   . CAD Mother   . Cancer Father   . Cirrhosis Sister     she is a heavy drinker.     History   Social History  . Marital Status: Legally Separated    Spouse Name: N/A  . Number of Children: 3  . Years of Education: N/A   Occupational History  . vocational rehabilitation    Social History Main Topics  . Smoking status: Current Every Day Smoker -- 0.25 packs/day for 40 years    Types: Cigarettes  . Smokeless tobacco: Current User  . Alcohol Use: No     Comment: occasionally. Quit  , none in 3weeks"only drank before  to help with pain before ERD visit"  . Drug Use: No  . Sexual Activity: Yes   Other Topics Concern  . Not on file   Social History Narrative    Review of Systems: Pt answers her name to all questions  Physical Exam: Vital signs in last 24  hours: Temp:  [97.1 F (36.2 C)-97.9 F (36.6 C)] 97.4 F (36.3 C) (02/25 0400) Pulse Rate:  [66-108] 82 (02/25 0600) Resp:  [15-26] 15 (02/25 0600) BP: (148-200)/(67-104) 160/89 mmHg (02/25 0600) SpO2:  [96 %-100 %] 99 % (02/25 0600) Weight:  [126 lb 1.7 oz (57.2 kg)] 126 lb 1.7 oz (57.2 kg) (02/24 2000) Last BM Date: 11/15/14 General:   confused, chronically ill appearing frail female in no obvious distress Head:  Normocephalic and atraumatic. Eyes:  Sclera clear, no icterus.   Conjunctiva pink. Ears:  Normal auditory acuity. Nose:  No deformity, discharge,  or lesions. Mouth:  No deformity or lesions.   Neck:  Supple; no masses or thyromegaly. Lungs:  Diminished breath sounds throughout  Heart:  Regular rate and rhythm; no murmurs, clicks, rubs,  or gallops. Abdomen:  Taut, dustended, diffusely tender, midline scar,  Diminished bowel sounds  Rectal:  Deferred  Msk:  Symmetrical without gross deformities. . Pulses:  Normal pulses noted. Extremities: 2+ edema lower extremities Neurologic:  Alert, oriented only to self Skin:  Intact without significant lesions or rashes.. Psych:  Alert, oriented only to self.  Intake/Output from previous day: 02/24 0701 - 02/25 0700 In: 1126.3 [I.V.:726.3; IV Piggyback:400] Out: 463 [Urine:463] Intake/Output this shift:    Lab Results:  Recent Labs  11/14/14 1154  WBC 10.5  HGB 8.5*  HCT 26.0*  PLT 279   BMET  Recent Labs  11/14/14 1154 11/15/14 0200  NA 139 137  K <2.0* 3.1*  CL 108 115*  CO2 21 16*  GLUCOSE 101* 73  BUN 12 11  CREATININE 0.54 0.49*  CALCIUM 7.7* 7.1*   LFT  Recent Labs  11/15/14 0200  PROT 7.5  ALBUMIN 1.3*  AST 126*  ALT 53*  ALKPHOS 1035*  BILITOT 5.1*   PT/INR  Recent Labs  11/14/14 1153  LABPROT 19.2*  INR 1.60*        Studies/Results: Ct Head Wo Contrast  11/14/2014  CLINICAL DATA:  Altered mental status  EXAM: CT HEAD WITHOUT CONTRAST  TECHNIQUE: Contiguous axial images  were obtained from the base of the skull through the vertex without intravenous contrast.  COMPARISON:  October 27, 2005  FINDINGS: The ventricles are normal in size and configuration. There is no mass, hemorrhage, extra-axial fluid collection, or midline shift. Gray-white compartments are normal. No acute infarct apparent. The bony calvarium appears intact. The mastoid air cells are clear.  IMPRESSION: No intracranial mass, hemorrhage, or focal gray -white compartment lesions/acute appearing infarct.   Electronically Signed   By: Lowella Grip III M.D.   On: 11/14/2014 14:17   US Abdomen Complete  11/14/2014   CLINICAL DATA:  Pancreatic cancer post Whipple procedure 08/07/2014, abdominal swelling for 1 week, prior cholecystectomy, hypertension, radiation therapy, smoker  EXAM: ULTRASOUND ABDOMEN COMPLETE  COMPARISON:  CT abdomen and pelvis 10/16/2014  FINDINGS: Gallbladder: Surgically absent  Common bile duct: Diameter: 5 mm diameter, normal  Liver: Heterogeneous in echogenicity. No discrete mass. Portal vein is not well demonstrated ; portal vein demonstrated thrombosis on the prior CT.  IVC: Intrahepatic portion normal appearance. Infrahepatic portion nonvisualized due to bowel gas.  Pancreas: Post Whipple procedure by history. Pancreatic bed inadequately visualized due to bowel gas.  Spleen: Normal appearance, 5.0 cm length  Right Kidney: Length: 9.3 cm. Normal morphology without mass or hydronephrosis.  Left Kidney: Length: 10.0 cm. Normal morphology without mass or hydronephrosis.  Abdominal aorta: Proximally normal caliber, remainder obscured by bowel gas  Other findings: Significant ascites throughout abdomen and pelvis.  IMPRESSION: Significant ascites significantly increased since prior CT exam, likely cause of abdominal swelling.  Limited exam due to bowel gas, with inadequate visualization of the pancreatic bed, portal vein and portions of the aorta.  Post cholecystectomy.   Electronically Signed    By: Lavonia Dana M.D.   On: 11/14/2014 14:32   Ct Abdomen Pelvis W Contrast  11/14/2014   CLINICAL DATA:  64 year old female with abdominal pain and distension. Current history of pancreatic cancer status post Whipple and radiation. Subsequent encounter.  EXAM: CT ABDOMEN AND PELVIS WITH CONTRAST  TECHNIQUE: Multidetector CT imaging of the abdomen and pelvis was performed using the standard protocol following bolus administration of intravenous contrast.  CONTRAST:  153m OMNIPAQUE IOHEXOL 300 MG/ML  SOLN  COMPARISON:  10/16/2014 and earlier.  FINDINGS: New moderate size bilateral pleural effusions with lower lobe atelectasis. No pericardial effusion.  Advanced degenerative changes in the lower lumbar spine. No acute osseous abnormality identified.  Increased body wall stranding and density in keeping with a degree of anasarca. Moderate to large volume of ascites in the abdomen has significantly progressed, and in the upper abdomen this is associated with new lobular mass effect on the liver contour (series 2, image 17).  No dilated large bowel. Widespread small and large bowel wall thickening. Pancreatic or biliary stent re- identified within a small bowel loop in the right upper quadrant which today is dilated with fluid up to 4 cm diameter. There is continued intra and extrahepatic biliary ductal dilatation. Pancreatic tail atrophy again noted.  In January of the main portal vein was thrombosed. On today images no portal venous system enhancement is identified.  The stomach is thick walled. Oral contrast was administered, and has reached mid small bowel loops.  Major arterial structures in the abdomen and pelvis appear patent. Aortoiliac calcified atherosclerosis noted. Renal enhancement and contrast excretion is within normal limits.  IMPRESSION: 1. Progression of disease. Progressed and  now diffuse portal venous system thrombosis since January. 2. Anasarca. Large volume ascites, mass effect on the liver may  reflect subcapsular or loculated ascites. Diffuse moderate to severe bowel wall thickening. Moderate bilateral pleural effusions. 3. Fluid-filled and dilated small bowel loop draining the pancreas.   Electronically Signed   By: Genevie Ann M.D.   On: 11/14/2014 14:42   PROCEDURES: ENDOSCOPIC IMPRESSION: 1. Prior Whipple surgery with gastroenterostomy in the gastric body 2. Fiable areas at the anastomosis 3. The EGD otherwise appeared normal RECOMMENDATIONS: 1. Friable areas at anastomosis more likely to bleed with anticoagulation. Continue PPI and add Carafate 2. Avoid ASA/NSAIDS 3. Follow up with Dr. Carlean Purl as needed eSigned: Ladene Artist, MD, [facility] 09/28/2014 8:09 AM    IMPRESSION/PLAN: #1. Ascites/anasarca. (? Carcinomatosis). Likely due to her liver disease and portal vein thrombosis along with hypoalbuminemia. Ultrasound guided paracentesis by interventional radiology diagnostic and therapeutic has been referred by the medical service. The fluid is to be checked for culture and cytology. #2. SBP. Diagnostic and therapeutic ultrasound-guided paracentesis will help eval for this. She is covered with Unasyn at this time.  #3. Klebsiella bacteremia. She has been on IV antibiotics via her PICC line. Unasyn will be continued. #4. Portal vein thrombosis. Due to her previous GI bleed she is currently not on anticoagulation. She has not had bleeding thus far in this admission. #5. Anemia. Follow CBC and recommend transfusing if hemoglobin below 7. #6 hepatic encephalopathy due to her liver disease. Currently she is nothing by mouth until she is alert enough to take by mouth. Recent CT of the head had no findings. Lactulose enema. Follow ammonia. Will add rifaximin when taking po. #7.  Elevated alk phos and transaminases.Has biliary dilatation on imaging that has been present.? Cholangitis though unlikely as wbc nl and pt afebrile. Will review with Dr Fuller Plan re further plans.    Hvozdovic, Deloris Ping 11/15/2014,  Pager [phone]     Attending physician's note   I have taken a history, examined the patient and reviewed the chart. I agree with the Advanced Practitioner's note, impression and recommendations. Extremely complicated patient S/P Whipple for pancreatic adenocarcinoma in 07/2014 with recurrent Klebsiella bacteremia, PVT, several hospitalizations. Now with ascites, pleural effusions, elevated LFTs, anemia. CT shows chronic, stable biliary dilaton however US show a normal CBD at 5 mm. R/O abdominal carcinomatosis with malignant ascites, portal hypertension with ascites from PVT, SBP, liver infiltrated by tumor, biliary obstruction. Paracentesis has been ordered.   Ladene Artist, MD [facility]

## 2014-11-15 NOTE — Procedures (Signed)
Successful US guided paracentesis from LLQ.  Yielded 3L of clear yellow fluid.  No immediate complications.  Pt tolerated well.   Specimen was sent for labs.  Ascencion Dike PA-C 11/15/2014 12:54 PM

## 2014-11-15 NOTE — Progress Notes (Signed)
Quick Note:  Please set pt up for ultrasound guided paracentesis. ______

## 2014-11-15 NOTE — Care Management Note (Signed)
CARE MANAGEMENT NOTE 11/15/2014  Patient:  Melinda Conrad, Melinda Conrad   Account Number:  [id number]  Date Initiated:  11/15/2014  Documentation initiated by:  Amandalynn Pitz  Subjective/Objective Assessment:   hypokalemia, abnormal ecg, poss. sepsis, Whipple's procedure 08/07/14, abd pain and distention     Action/Plan:   home when stable   Anticipated DC Date:  11/18/2014   Anticipated DC Plan:  HOME/SELF CARE  In-house referral  NA      DC Planning Services  CM consult      PAC Choice  NA   Choice offered to / List presented to:  NA   DME arranged  NA      DME agency  NA     [place] arranged  NA      [place] agency  NA   Status of service:  In process, will continue to follow Medicare Important Message given?   (If response is "NO", the following Medicare IM given date fields will be blank) Date Medicare IM given:   Medicare IM given by:   Date Additional Medicare IM given:   Additional Medicare IM given by:    Discharge Disposition:    Per UR Regulation:  Reviewed for med. necessity/level of care/duration of stay  If discussed at [place] of Stay Meetings, dates discussed:    Comments:  Feb. 25 2016/Christionna Poland L. Rosana Hoes, RN, BSN, CCM. Case Management [place] [phone] No discharge needs present of time of review.

## 2014-11-15 NOTE — Progress Notes (Signed)
INITIAL NUTRITION ASSESSMENT  DOCUMENTATION CODES Per approved criteria  -Severe malnutrition in the context of chronic illness  Pt meets criteria for severe MALNUTRITION in the context of chronic illness as evidenced by severe fat and muscle wasting.  INTERVENTION: - Diet advancement per MD - Once diet advanced, add Magic cup TID with meals, each supplement provides 290 kcal and 9 grams of protein - Once diet advanced, add Resource Breeze po TID, each supplement provides 250 kcal and 9 grams of protein   NUTRITION DIAGNOSIS: Inadequate oral intake related to inability to eat as evidenced by NPO.   Goal: Pt to meet >/= 90% of their estimated nutrition needs   Monitor:  Weight trend, NPO, labs  Reason for Assessment: Malnutrition Screening Tool  64 y.o. female  Admitting Dx: Hepatic encephalopathy  ASSESSMENT: 64 y.o. female with history of adenocarcinoma of head of pancreas, status post Whipple's procedure 08/07/14, completed concurrent capecitabine and radiation, poor performance status and hence did not get adjuvant chemotherapy, recurrent Klebsiella bacteremia- attributed to intra-abdominal source/presumed septic portal vein thrombus, acute portal vein thrombosis-not on anticoagulation secondary to GI bleed, tobacco abuse & hepatitis C presented to the [facility] ED on 11/14/14 with complaints of progressive mental status changes, abdominal distention/pain and poor appetite.   - Pt seen by dietitian during previous admission. - Pt currently NPO and asking for po liquids.  - PO intake has been very little for the past several weeks.  - Pt s/p paracentesis today.  - She does not like Ensure supplements. Agreed to try [employer] and [employer].  - Pt's wt is increased, probably due to fluid retention. - labs reviewed; K low  Nutrition Focused Physical Exam:  Subcutaneous Fat:  Orbital Region: severe depletion Upper Arm Region: severe depletion Thoracic and  Lumbar Region: severe depletion  Muscle:  Temple Region: severe depletion Clavicle Bone Region: severe depletion Clavicle and Acromion Bone Region:severe depletion  Scapular Bone Region: severe depletion Dorsal Hand:severe depletion  Patellar Region: severe depletion Anterior Thigh Region: severe depletion Posterior Calf Region: severe depletion  Edema: none  Height: Ht Readings from Last 1 Encounters:  11/14/14 5\' 3"  (1.6 m)    Weight: Wt Readings from Last 1 Encounters:  11/14/14 126 lb 1.7 oz (57.2 kg)    Ideal Body Weight: 52.4 kg  % Ideal Body Weight: 109%  Wt Readings from Last 10 Encounters:  11/14/14 126 lb 1.7 oz (57.2 kg)  10/29/14 111 lb 1.6 oz (50.395 kg)  10/17/14 100 lb 8.5 oz (45.6 kg)  10/09/14 127 lb 3.3 oz (57.7 kg)  09/10/14 109 lb 9.6 oz (49.714 kg)  08/07/14 118 lb 6.2 oz (53.7 kg)  08/02/14 121 lb 11.2 oz (55.203 kg)  07/30/14 121 lb (54.885 kg)  07/26/14 122 lb 1.6 oz (55.384 kg)  06/22/14 125 lb 4.8 oz (56.836 kg)    BMI:  Body mass index is 22.34 kg/(m^2).  Estimated Nutritional Needs: Kcal: 1600-1800 Protein: 85-100 g Fluid: 1.8 L/day  Skin: closed incision on abdomen  Diet Order: Diet NPO time specified  EDUCATION NEEDS: -Education needs addressed   Intake/Output Summary (Last 24 hours) at 11/15/14 1515 Last data filed at 11/15/14 1400  Gross per 24 hour  Intake 1556.25 ml  Output    785 ml  Net 771.25 ml    Last BM: prior to admission   Labs:   Recent Labs Lab 11/14/14 1154 11/15/14 0200  NA 139 137  K <2.0* 3.1*  CL 108 115*  CO2 21 16*  BUN 12 11  CREATININE 0.54 0.49*  CALCIUM 7.7* 7.1*  MG 1.9  --   GLUCOSE 101* 73    CBG (last 3)   Recent Labs  11/14/14 1120 11/14/14 1150  GLUCAP 92 99    Scheduled Meds: . ampicillin-sulbactam (UNASYN) IV  3 g Intravenous 4 times per day  . antiseptic oral rinse  7 mL Mouth Rinse q12n4p  . chlorhexidine  15 mL Mouth Rinse BID  . pantoprazole (PROTONIX) IV   40 mg Intravenous Q24H  . sodium chloride  3 mL Intravenous Q12H    Continuous Infusions: . 0.9 % NaCl with KCl 40 mEq / L 75 mL/hr (11/15/14 1041)    Past Medical History  Diagnosis Date  . Arthritis   . Depression   . H/O: substance abuse   . Back pain   . Personal history of colonic polyp - adenoma 10/26/2013    10/26/2013 diminutive sigmoid polyp removed    . Pancreatitis, acute   . Hypertension   . Osteoporosis   . Radiation 04/30/14-06/08/14    pancreas 50 gray  . Cancer     pancreatic cancer  . Hemorrhagic shock 09/14/2014  . Acute GI bleeding 09/14/2014  . Thrombocytopenia 09/16/2014  . 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 Acute respiratory failure with hypoxia   . Bacteremia due to Klebsiella pneumoniae   . Anemia due to acute blood loss 09/26/2014    Past Surgical History  Procedure Laterality Date  . Lumbar epidural injection    . Colonoscopy    . Ercp N/A 03/06/2014    Procedure: ENDOSCOPIC RETROGRADE CHOLANGIOPANCREATOGRAPHY (ERCP);  Surgeon: Gatha Mayer, MD;  Location: [facility] ENDOSCOPY;  Service: Endoscopy;  Laterality: N/A;  . Eus N/A 04/05/2014    Procedure: UPPER ENDOSCOPIC ULTRASOUND (EUS) RADIAL;  Surgeon: [name], MD;  Location: [facility] ENDOSCOPY;  Service: Endoscopy;  Laterality: N/A;  . Whipple procedure N/A 08/07/2014    Procedure:  DIAGNOSTIC LAPAROSCOPY, WHIPPLE PROCEDURE;  Surgeon: Stark Klein, MD;  Location: [facility] ORS;  Service: General;  Laterality: N/A;  . Esophagogastroduodenoscopy N/A 09/28/2014    Procedure: ESOPHAGOGASTRODUODENOSCOPY (EGD);  Surgeon: Ladene Artist, MD;  Location: [facility] ENDOSCOPY;  Service: Endoscopy;  Laterality: N/A;    Laurette Schimke [place], [place], [place]

## 2014-11-16 ENCOUNTER — Inpatient Hospital Stay (HOSPITAL_COMMUNITY): Payer: Medicaid Other

## 2014-11-16 ENCOUNTER — Other Ambulatory Visit (INDEPENDENT_AMBULATORY_CARE_PROVIDER_SITE_OTHER): Payer: Self-pay | Admitting: General Surgery

## 2014-11-16 DIAGNOSIS — R188 Other ascites: Secondary | ICD-10-CM

## 2014-11-16 DIAGNOSIS — E7220 Disorder of urea cycle metabolism, unspecified: Secondary | ICD-10-CM

## 2014-11-16 DIAGNOSIS — R7881 Bacteremia: Secondary | ICD-10-CM

## 2014-11-16 LAB — COMPREHENSIVE METABOLIC PANEL
ALT: 55 U/L — AB (ref 0–35)
ANION GAP: 4 — AB (ref 5–15)
AST: 127 U/L — ABNORMAL HIGH (ref 0–37)
Albumin: 1.2 g/dL — ABNORMAL LOW (ref 3.5–5.2)
Alkaline Phosphatase: 969 U/L — ABNORMAL HIGH (ref 39–117)
BUN: 10 mg/dL (ref 6–23)
CALCIUM: 7.3 mg/dL — AB (ref 8.4–10.5)
CO2: 21 mmol/L (ref 19–32)
CREATININE: 0.44 mg/dL — AB (ref 0.50–1.10)
Chloride: 116 mmol/L — ABNORMAL HIGH (ref 96–112)
GFR calc non Af Amer: >90 mL/min (ref >90)
GLUCOSE: 80 mg/dL (ref 70–99)
Potassium: 3.1 mmol/L — ABNORMAL LOW (ref 3.5–5.1)
SODIUM: 141 mmol/L (ref 135–145)
TOTAL PROTEIN: 7.2 g/dL (ref 6.0–8.3)
Total Bilirubin: 5.4 mg/dL — ABNORMAL HIGH (ref 0.3–1.2)

## 2014-11-16 LAB — CBC
HEMATOCRIT: 23.7 % — AB (ref 36.0–46.0)
Hemoglobin: 7.9 g/dL — ABNORMAL LOW (ref 12.0–15.0)
MCH: 28.3 pg (ref 26.0–34.0)
MCHC: 33.3 g/dL (ref 30.0–36.0)
MCV: 84.9 fL (ref 78.0–100.0)
PLATELETS: 226 10*3/uL (ref 150–400)
RBC: 2.79 MIL/uL — AB (ref 3.87–5.11)
RDW: 21.9 % — AB (ref 11.5–15.5)
WBC: 5.8 10*3/uL (ref 4.0–10.5)

## 2014-11-16 LAB — PH, BODY FLUID: PH, FLUID: 8

## 2014-11-16 LAB — AMMONIA: Ammonia: 68 umol/L — ABNORMAL HIGH (ref 11–32)

## 2014-11-16 MED ORDER — SODIUM CHLORIDE 0.9 % IV SOLN
INTRAVENOUS | Status: DC
  Administered 2014-11-16 – 2014-11-17 (×3): via INTRAVENOUS
  Administered 2014-11-18: 75 mL/h via INTRAVENOUS
  Administered 2014-11-19 – 2014-11-22 (×3): via INTRAVENOUS

## 2014-11-16 MED ORDER — POTASSIUM CHLORIDE 20 MEQ PO PACK
40.0000 meq | PACK | Freq: Two times a day (BID) | ORAL | Status: DC
  Filled 2014-11-16 (×2): qty 2

## 2014-11-16 MED ORDER — POTASSIUM CHLORIDE CRYS ER 20 MEQ PO TBCR
40.0000 meq | EXTENDED_RELEASE_TABLET | Freq: Two times a day (BID) | ORAL | Status: AC
  Administered 2014-11-16 (×2): 40 meq via ORAL
  Filled 2014-11-16 (×2): qty 2

## 2014-11-16 NOTE — Progress Notes (Signed)
PT Cancellation Note  Patient Details Name: Melinda Conrad MRN: [id number] DOB: 03/12/1951   Cancelled Treatment:    Reason Eval/Treat Not Completed: Patient declined, no reason specified   [facility] 11/16/2014, 1:36 PM

## 2014-11-16 NOTE — Progress Notes (Signed)
[facility]    Date of Admission:  11/14/2014   Total days of antibiotics 31        Day 3 amp/sub ( during hospitalization)           ID: Melinda Conrad is a 64 y.o. female with previously admitted for strep anginosus/klebsiella bacteremia in setting of colitis, PV thrombosis, admitted for hepatic encephalopathy, new onset ascites   Principal Problem:   Hepatic encephalopathy Active Problems:   Pancreatic cancer   Protein-calorie malnutrition, severe   Portal vein thrombosis   Hypokalemia   Anemia in neoplastic disease   Recurrent bacteremia   Hyperammonemia   Hepatitis C   Ascites/anasarca   Dehydration   Failure to thrive in adult   Abdominal pain, acute    Subjective: Still remains confused this morning, but alert. She c/o abdominal pain. She underwent a large volume paracentesis yesterday by IR having 3L removed.  Medications:  . ampicillin-sulbactam (UNASYN) IV  3 g Intravenous 4 times per day  . antiseptic oral rinse  7 mL Mouth Rinse q12n4p  . chlorhexidine  15 mL Mouth Rinse BID  . feeding supplement (RESOURCE BREEZE)  1 Container Oral TID BM  . pantoprazole (PROTONIX) IV  40 mg Intravenous Q24H  . potassium chloride  40 mEq Oral BID  . sodium chloride  3 mL Intravenous Q12H    Objective: Vital signs in last 24 hours: Temp:  [97.4 F (36.3 C)-98.2 F (36.8 C)] 98 F (36.7 C) (02/26 0800) Pulse Rate:  [67-97] 80 (02/26 0800) Resp:  [13-26] 19 (02/26 0800) BP: (137-179)/(76-106) 162/93 mmHg (02/26 0800) SpO2:  [97 %-100 %] 100 % (02/26 0800)  Physical Exam  Constitutional:  oriented to person,and place. appears chronically ill and cachetic. No acute distress HENT: bitemporal wasting Mouth/Throat: Oropharynx is clear and moist. No oropharyngeal exudate. dry Cardiovascular: Normal rate, regular rhythm and normal heart sounds. Exam reveals no gallop and no friction rub.  No murmur heard.  Pulmonary/Chest: Effort normal and  breath sounds normal. No respiratory distress.  has no wheezes.  Abdominal: Soft. Bowel sounds are decrease. Distended abdomen Lymphadenopathy: no cervical adenopathy.   Skin: Skin is warm and dry. No rash noted. No erythema.  Psychiatric: a normal mood and affect.  behavior is normal.   Lab Results  Recent Labs  11/15/14 0200 11/15/14 0857 11/16/14 0425  WBC  --  8.6 5.8  HGB  --  8.0* 7.9*  HCT  --  23.8* 23.7*  NA 137  --  141  K 3.1*  --  3.1*  CL 115*  --  116*  CO2 16*  --  21  BUN 11  --  10  CREATININE 0.49*  --  0.44*   Liver Panel  Recent Labs  11/15/14 0200 11/16/14 0425  PROT 7.5 7.2  ALBUMIN 1.3* 1.2*  AST 126* 127*  ALT 53* 55*  ALKPHOS 1035* 969*  BILITOT 5.1* 5.4*    Microbiology: 2/24 blood cx ngtd 2/25 ascites fluid cx ngtd  Studies/Results: Ct Head Wo Contrast  11/14/2014   CLINICAL DATA:  Altered mental status  EXAM: CT HEAD WITHOUT CONTRAST  TECHNIQUE: Contiguous axial images were obtained from the base of the skull through the vertex without intravenous contrast.  COMPARISON:  October 27, 2005  FINDINGS: The ventricles are normal in size and configuration. There is no mass, hemorrhage, extra-axial fluid collection, or midline shift. Gray-white compartments are normal. No acute infarct apparent. The bony  calvarium appears intact. The mastoid air cells are clear.  IMPRESSION: No intracranial mass, hemorrhage, or focal gray -white compartment lesions/acute appearing infarct.   Electronically Signed   By: Lowella Grip III M.D.   On: 11/14/2014 14:17   Ct Abdomen Pelvis W Contrast  11/14/2014   CLINICAL DATA:  64 year old female with abdominal pain and distension. Current history of pancreatic cancer status post Whipple and radiation. Subsequent encounter.  EXAM: CT ABDOMEN AND PELVIS WITH CONTRAST  TECHNIQUE: Multidetector CT imaging of the abdomen and pelvis was performed using the standard protocol following bolus administration of intravenous  contrast.  CONTRAST:  137mL OMNIPAQUE IOHEXOL 300 MG/ML  SOLN  COMPARISON:  10/16/2014 and earlier.  FINDINGS: New moderate size bilateral pleural effusions with lower lobe atelectasis. No pericardial effusion.  Advanced degenerative changes in the lower lumbar spine. No acute osseous abnormality identified.  Increased body wall stranding and density in keeping with a degree of anasarca. Moderate to large volume of ascites in the abdomen has significantly progressed, and in the upper abdomen this is associated with new lobular mass effect on the liver contour (series 2, image 17).  No dilated large bowel. Widespread small and large bowel wall thickening. Pancreatic or biliary stent re- identified within a small bowel loop in the right upper quadrant which today is dilated with fluid up to 4 cm diameter. There is continued intra and extrahepatic biliary ductal dilatation. Pancreatic tail atrophy again noted.  In January of the main portal vein was thrombosed. On today images no portal venous system enhancement is identified.  The stomach is thick walled. Oral contrast was administered, and has reached mid small bowel loops.  Major arterial structures in the abdomen and pelvis appear patent. Aortoiliac calcified atherosclerosis noted. Renal enhancement and contrast excretion is within normal limits.  IMPRESSION: 1. Progression of disease. Progressed and now diffuse portal venous system thrombosis since January. 2. Anasarca. Large volume ascites, mass effect on the liver may reflect subcapsular or loculated ascites. Diffuse moderate to severe bowel wall thickening. Moderate bilateral pleural effusions. 3. Fluid-filled and dilated small bowel loop draining the pancreas.   Electronically Signed   By: Genevie Ann M.D.   On: 11/14/2014 14:42   US Paracentesis  11/15/2014   CLINICAL DATA:  Ascites, history of pancreatic cancer. Request therapeutic and diagnostic paracentesis of up to 3 L max.  EXAM: ULTRASOUND GUIDED  PARACENTESIS  COMPARISON:  None.  PROCEDURE: An ultrasound guided paracentesis was thoroughly discussed with the patient and questions answered. The benefits, risks, alternatives and complications were also discussed. The patient understands and wishes to proceed with the procedure. Written consent was obtained.  Ultrasound was performed to localize and mark an adequate pocket of fluid in the left lower quadrant of the abdomen. The area was then prepped and draped in the normal sterile fashion. 1% Lidocaine was used for local anesthesia. Under ultrasound guidance a 19 gauge Yueh catheter was introduced. Paracentesis was performed. The catheter was removed and a dressing applied.  COMPLICATIONS: None immediate  FINDINGS: A total of approximately 3 L of clear yellow fluid was removed. A fluid sample was sent for laboratory analysis.  IMPRESSION: Successful ultrasound guided paracentesis yielding 3 L of ascites.  Read by: Ascencion Dike PA-C   Electronically Signed   By: Corrie Mckusick D.O.   On: 11/15/2014 12:55     Assessment/Plan: Previous klebsiella and streptococcal bacteremia = currently on day 31 of 42 days of therapy. Initially recommended 6 wk course of  therapy due to presumed septic thrombosis due to her PV thrombosis. She has nearly completed her course of therapy. Would recommend finishing out course with oral antibiotics such as cefuroxime 500mg  BID through march 8th, once she is ready for discharge so that she no longer needs PICC line and potential nidus for further infection or DVT.  ?ileus = distended abdomen. Continue to monitor, would get plain films to see if any sbo, to need decompression with ng  Progressed portal vein thrombosis = appears worsening likely contributing to obstruction and ascites. Previously unable to be on anticoagulation due to GI bleed  Ascites = does not appear to be c/w SBP. Awaiting cytology  Poor prognosis = her overall health is very poor. She has considerable  deconditioning, consider re-addressing goals of care talk with palliative care  Chronic hepatitis c without hepatic coma = not likely a candidate for treatment unless her health improves. Can re  Will see back on Monday. If you have questions, dr. Megan Salon available over the weekend.  [facility] Cell: [phone] Pager: [phone]  11/16/2014, 10:55 AM

## 2014-11-16 NOTE — Progress Notes (Signed)
[facility] Progress Note  Subjective:   Complains of abdominal pain and nausea. Awake, aware she is in hospital but has difficulty answering other questions. Confused.   Objective:  Vital signs in last 24 hours: Temp:  [97.4 F (36.3 C)-98.2 F (36.8 C)] 98 F (36.7 C) (02/26 0800) Pulse Rate:  [67-97] 88 (02/26 1000) Resp:  [13-26] 16 (02/26 1000) BP: (143-179)/(76-101) 158/101 mmHg (02/26 1000) SpO2:  [99 %-100 %] 100 % (02/26 0800) Last BM Date: 11/15/14 General:   Alert, thin,in NAD Heart:  Regular rate and rhythm; no murmurs Pulm;diminished breath sounds throughout Abdomen:  Soft, diffusely tender, distended. Diminished bowel sounds Extremities: bilat lower extremity edema Neurologic:  Alert, awake, oriented to self and place   Intake/Output from previous day: 02/25 0701 - 02/26 0700 In: 1390 [I.V.:1140; IV Piggyback:250] Out: 550 [Urine:550] Intake/Output this shift: Total I/O In: -  Out: 80 [Urine:80]  Lab Results:  Recent Labs  11/14/14 1154 11/15/14 0857 11/16/14 0425  WBC 10.5 8.6 5.8  HGB 8.5* 8.0* 7.9*  HCT 26.0* 23.8* 23.7*  PLT 279 273 226   BMET  Recent Labs  11/14/14 1154 11/15/14 0200 11/16/14 0425  NA 139 137 141  K <2.0* 3.1* 3.1*  CL 108 115* 116*  CO2 21 16* 21  GLUCOSE 101* 73 80  BUN '12 11 10  ' CREATININE 0.54 0.49* 0.44*  CALCIUM 7.7* 7.1* 7.3*   LFT  Recent Labs  11/16/14 0425  PROT 7.2  ALBUMIN 1.2*  AST 127*  ALT 55*  ALKPHOS 969*  BILITOT 5.4*   PT/INR  Recent Labs  11/14/14 1153  LABPROT 19.2*  INR 1.60*   Hepatitis Panel No results for input(s): HEPBSAG, HCVAB, HEPAIGM, HEPBIGM in the last 72 hours.  Ct Head Wo Contrast  11/14/2014   CLINICAL DATA:  Altered mental status  EXAM: CT HEAD WITHOUT CONTRAST  TECHNIQUE: Contiguous axial images were obtained from the base of the skull through the vertex without intravenous contrast.  COMPARISON:  October 27, 2005  FINDINGS: The ventricles  are normal in size and configuration. There is no mass, hemorrhage, extra-axial fluid collection, or midline shift. Gray-white compartments are normal. No acute infarct apparent. The bony calvarium appears intact. The mastoid air cells are clear.  IMPRESSION: No intracranial mass, hemorrhage, or focal gray -white compartment lesions/acute appearing infarct.   Electronically Signed   By: Lowella Grip III M.D.   On: 11/14/2014 14:17   Ct Abdomen Pelvis W Contrast  11/14/2014   CLINICAL DATA:  64 year old female with abdominal pain and distension. Current history of pancreatic cancer status post Whipple and radiation. Subsequent encounter.  EXAM: CT ABDOMEN AND PELVIS WITH CONTRAST  TECHNIQUE: Multidetector CT imaging of the abdomen and pelvis was performed using the standard protocol following bolus administration of intravenous contrast.  CONTRAST:  130m OMNIPAQUE IOHEXOL 300 MG/ML  SOLN  COMPARISON:  10/16/2014 and earlier.  FINDINGS: New moderate size bilateral pleural effusions with lower lobe atelectasis. No pericardial effusion.  Advanced degenerative changes in the lower lumbar spine. No acute osseous abnormality identified.  Increased body wall stranding and density in keeping with a degree of anasarca. Moderate to large volume of ascites in the abdomen has significantly progressed, and in the upper abdomen this is associated with new lobular mass effect on the liver contour (series 2, image 17).  No dilated large bowel. Widespread small and large bowel wall thickening. Pancreatic or biliary stent re- identified within a small bowel loop in  the right upper quadrant which today is dilated with fluid up to 4 cm diameter. There is continued intra and extrahepatic biliary ductal dilatation. Pancreatic tail atrophy again noted.  In January of the main portal vein was thrombosed. On today images no portal venous system enhancement is identified.  The stomach is thick walled. Oral contrast was administered,  and has reached mid small bowel loops.  Major arterial structures in the abdomen and pelvis appear patent. Aortoiliac calcified atherosclerosis noted. Renal enhancement and contrast excretion is within normal limits.  IMPRESSION: 1. Progression of disease. Progressed and now diffuse portal venous system thrombosis since January. 2. Anasarca. Large volume ascites, mass effect on the liver may reflect subcapsular or loculated ascites. Diffuse moderate to severe bowel wall thickening. Moderate bilateral pleural effusions. 3. Fluid-filled and dilated small bowel loop draining the pancreas.   Electronically Signed   By: Genevie Ann M.D.   On: 11/14/2014 14:42   US Paracentesis  11/15/2014   CLINICAL DATA:  Ascites, history of pancreatic cancer. Request therapeutic and diagnostic paracentesis of up to 3 L max.  EXAM: ULTRASOUND GUIDED PARACENTESIS  COMPARISON:  None.  PROCEDURE: An ultrasound guided paracentesis was thoroughly discussed with the patient and questions answered. The benefits, risks, alternatives and complications were also discussed. The patient understands and wishes to proceed with the procedure. Written consent was obtained.  Ultrasound was performed to localize and mark an adequate pocket of fluid in the left lower quadrant of the abdomen. The area was then prepped and draped in the normal sterile fashion. 1% Lidocaine was used for local anesthesia. Under ultrasound guidance a 19 gauge Yueh catheter was introduced. Paracentesis was performed. The catheter was removed and a dressing applied.  COMPLICATIONS: None immediate  FINDINGS: A total of approximately 3 L of clear yellow fluid was removed. A fluid sample was sent for laboratory analysis.  IMPRESSION: Successful ultrasound guided paracentesis yielding 3 L of ascites.  Read by: Ascencion Dike PA-C   Electronically Signed   By: Corrie Mckusick D.O.   On: 11/15/2014 12:55    ASSESSMENT/PLAN:   #1. Ascites/anasarca. (? Carcinomatosis). Likely due to  portal vein thrombosis along with hypoalbuminemia. Ultrasound guided paracentesis done yesterday by IR. Ascites not appreciably improved post paracentesis. Cytology pending.  #2. SBP. Paracentesis done yesterday .Ascites fluid 2/25 NGTD #3. Klebsiella bacteremia. She has been on IV antibiotics via her PICC line. Unasyn will be continued. #4. Portal vein thrombosis. Due to her previous GI bleed she is currently not on anticoagulation. Appears to be worsening and likely contributing to ascites. #5. Anemia. Follow CBC and recommend transfusing if hemoglobin below 7. #6 hepatic encephalopathy due to her liver disease. Currently she is nothing by mouth until she is alert enough to take by mouth. Recent CT of the head had no findings. Lactulose enema. Follow ammonia. Ammonia today pending. Would add xifaxan when taking po. #7. Elevated alk phos and transaminases. Has biliary dilatation on CT that has been present and stable though US shows nl CBD at 29m. #8. ? Ileus with diminished BS, nausea. Check plain films to eval for SBO. ( CT 2/24 had widespread small and large bowel wall thickening).    LOS: 2 days   Hvozdovic, LDeloris Ping2/26/2016, Pager [phone]    Attending physician's note   I have taken an interval history, reviewed the chart and examined the patient. I agree with the Advanced Practitioner's note, impression and recommendations. Worsening ascites/anasarca from PVT with worsening and complete  PV obstruction by CT. Fluid filled and dilated SB loop draining the pancreas. Bowel wall thickening likely from PVT/ascites/anasarca/low albumin. Abd films today. Continue antibiotics per ID recommendations. Prognosis is poor. Consider goals of care and palliative care if oncology and surgery concur.  Pricilla Riffle. Fuller Plan, MD [facility]

## 2014-11-16 NOTE — Progress Notes (Signed)
[facility]                                                                              Patient Demographics  Melinda Conrad, is a 64 y.o. female, DOB - Oct 18, 1950, EYC:[id number]  Admit date - 11/14/2014   Admitting Physician Modena Jansky, MD  Outpatient Primary MD for the patient is Marlou Sa, ERIC, MD  LOS - 2   Chief Complaint  Patient presents with  . Ascites  . Weakness  . Altered Mental Status      HPI on 11/14/2014 by Dr. [name] Melinda Conrad is a 64 y.o. female with history of adenocarcinoma of head of pancreas, status post Whipple's procedure 08/07/14, completed concurrent capecitabine and radiation, poor performance status and hence did not get adjuvant chemotherapy, recurrent Klebsiella bacteremia- attributed to intra-abdominal source/presumed septic portal vein thrombus, acute portal vein thrombosis-not on anticoagulation secondary to GI bleed, tobacco abuse & hepatitis C presented to the [facility] ED on 11/14/14 with complaints of progressive mental status changes, abdominal distention/pain and poor appetite. Patient unable to provide history secondary to AMS. No family at bedside currently. History obtained from EDP who discussed with family. Patient apparently has been having progressive failure to thrive since Whipple's procedure. However over the last 3 days, family have noticed gradually worsening confusion, disorientation and lethargic to a point of near unresponsiveness today, decreased oral intake of liquids and food but no nausea or vomiting, progressive abdominal and lower extremity swelling with intermittent abdominal pain and no fevers. Patient has been getting IV antibiotics through right upper extremity PICC line and therefore plans to remove PICC line and from IV Unasyn to oral cefuroxime. In the ED, patient is somnolent but arousable, afebrile, elevated blood pressure, lab work significant for potassium <2, abnormal LFTs, ammonia 120,  hemoglobin 8.5, CT head without acute findings, CT abdomen and pelvis: Diffuse portal vein system thrombosis, anasarca, diffuse moderate to severe bowel wall thickening, moderate bilateral pleural effusions and fluid filled and dilated small bowel loop draining the pancreas. Hospitalist admission was requested.  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/hepatitis C -Improving slightly, but continues to have confusion (unknown baseline) -Patient was given lactulose enema -CT of the head: No acute findings -Blood cultures: no growth to date -Gastroenterology consulted and appreciated -Once patient is able to tolerate by mouth's, will add on rifaximin (per GI) -Ammonia level upon admission 120, pending repeat  Anasarca/ascites -Secondary to liver disease, portal vein thrombosis and hypoalbuminemia with malnutrition -Interventional radiology consult appreciated -Patient had ultrasound-guided paracentesis with removal of 3 L (11/15/2014) -Patient empirically started on IV Unasyn to cover for possible SBP (as patient has had klebseilla bacteremia in the 2015) -Fluid cytology pending -Fluid culture: no growth to date, no organisms seen -Fluid chemistry not consistent with SBP -SAAG: 0.3; given her history of pancreatitic cancer, suspect carcinomatosis??? -Patient continues to have a firm abdomen with pain, will obtain repeat US  Severe hypokalemia -Magnesium 1.9 -Continue to replace and monitor BMP  Dehydration -Secondary to poor oral intake and continue IV fluids  Portal vein thrombosis -Patient has had history of GI bleed is currently not anticoagulation candidate  Anemia secondary to chronic disease -Baseline approximately 8, currently 7.9 -Will transfuse if below 7  Hepatitis C -Currently not on treatment  Severe protein calorie malnutrition/failure to thrive -Nutrition consulted and appreciated -Patient started on clear liquid diet with nutritional  supplementation  Pancreatic adenocarcinoma -S/p Whipple's procedure -Oncology consulted and appreciated  ?Small bowel ileus -will start patient on   Code Status: Full  Family Communication: None at bedside; attempt to call daughter  Disposition Plan: Admitted, continue to monitor, pending cytology  Time Spent in minutes   30 minutes  Procedures  Ultrasound guided paracentesis  Consults   Interventional radiology Gastroenterology General surgery Oncology  DVT Prophylaxis  SCDs  Lab Results  Component Value Date   PLT 226 11/16/2014    Medications  Scheduled Meds: . ampicillin-sulbactam (UNASYN) IV  3 g Intravenous 4 times per day  . antiseptic oral rinse  7 mL Mouth Rinse q12n4p  . chlorhexidine  15 mL Mouth Rinse BID  . feeding supplement (RESOURCE BREEZE)  1 Container Oral TID BM  . pantoprazole (PROTONIX) IV  40 mg Intravenous Q24H  . potassium chloride  40 mEq Oral BID  . sodium chloride  3 mL Intravenous Q12H   Continuous Infusions:   PRN Meds:.acetaminophen, albuterol, hydrALAZINE, morphine injection, ondansetron (ZOFRAN) IV  Antibiotics    Anti-infectives    Start     Dose/Rate Route Frequency Ordered Stop   11/15/14 0000  Ampicillin-Sulbactam (UNASYN) 3 g in sodium chloride 0.9 % 100 mL IVPB     3 g 100 mL/hr over 60 Minutes Intravenous 4 times per day 11/14/14 1616     11/14/14 1615  Ampicillin-Sulbactam (UNASYN) 3 g in sodium chloride 0.9 % 100 mL IVPB     3 g 100 mL/hr over 60 Minutes Intravenous STAT 11/14/14 1609 11/14/14 1827   11/14/14 1600  ampicillin-sulbactam (UNASYN) 1.5 g in sodium chloride 0.9 % 50 mL IVPB  Status:  Discontinued     1.5 g 100 mL/hr over 30 Minutes Intravenous  Once 11/14/14 1527 11/14/14 1609        Subjective:   [name] seen and examined today.  Patient currently awake and knows she is in the hospital, but remains confused.  Continues to complain of abdominal pain.    Objective:   Filed Vitals:    11/16/14 0328 11/16/14 0400 11/16/14 0411 11/16/14 0800  BP:  143/91  162/93  Pulse:   91 80  Temp: 98.1 F (36.7 C)   98 F (36.7 C)  TempSrc: Oral   Oral  Resp:   17 19  Height:      Weight:      SpO2:   99% 100%    Wt Readings from Last 3 Encounters:  11/14/14 57.2 kg (126 lb 1.7 oz)  10/29/14 50.395 kg (111 lb 1.6 oz)  10/17/14 45.6 kg (100 lb 8.5 oz)     Intake/Output Summary (Last 24 hours) at 11/16/14 1114 Last data filed at 11/16/14 0800  Gross per 24 hour  Intake    940 ml  Output    355 ml  Net    585 ml    Exam  General: Well developed, thin, NAD  HEENT: NCAT, mucous membranes dry  Cardiovascular: S1 S2 auscultated, no rubs, murmurs or gallops. Regular rate and rhythm.  Respiratory: Diminished breath sounds  Abdomen: Firm, distended, diffusely tender, hypoactive bowel sounds  Extremities: warm dry without cyanosis clubbing. +LE edema B/L  Neuro: Awake and alert, oriented to self and  place.  Remains confused.  Able to follow commands.   Data Review   Micro Results Recent Results (from the past 240 hour(s))  Blood culture (routine x 2)     Status: None (Preliminary result)   Collection Time: 11/14/14 12:30 PM  Result Value Ref Range Status   Specimen Description BLOOD RIGHT HAND  Final   Special Requests BOTTLES DRAWN AEROBIC ONLY 4CC  Final   Culture   Final           BLOOD CULTURE RECEIVED NO GROWTH TO DATE CULTURE WILL BE HELD FOR 5 DAYS BEFORE ISSUING A FINAL NEGATIVE REPORT Performed at [facility]    Report Status PENDING  Incomplete  MRSA PCR Screening     Status: None   Collection Time: 11/14/14  5:15 PM  Result Value Ref Range Status   MRSA by PCR NEGATIVE NEGATIVE Final    Comment:        The GeneXpert MRSA Assay (FDA approved for NASAL specimens only), is one component of a comprehensive MRSA colonization surveillance program. It is not intended to diagnose MRSA infection nor to guide or monitor treatment for MRSA  infections.   Body fluid culture     Status: None (Preliminary result)   Collection Time: 11/15/14 12:10 PM  Result Value Ref Range Status   Specimen Description ASCITIC  Final   Special Requests NONE  Final   Gram Stain   Final    RARE WBC PRESENT, PREDOMINANTLY PMN NO ORGANISMS SEEN Performed at [facility]    Culture   Final    NO GROWTH 1 DAY Performed at [facility]    Report Status PENDING  Incomplete  Anaerobic culture     Status: None (Preliminary result)   Collection Time: 11/15/14 12:10 PM  Result Value Ref Range Status   Specimen Description FLUID ASCITIES  Final   Special Requests NONE  Final   Gram Stain   Final    RARE WBC PRESENT, PREDOMINANTLY PMN NO ORGANISMS SEEN Performed at [facility]    Culture   Final    NO ANAEROBES ISOLATED; CULTURE IN PROGRESS FOR 5 DAYS Performed at [facility]    Report Status PENDING  Incomplete    Radiology Reports Ct Head Wo Contrast  11/14/2014   CLINICAL DATA:  Altered mental status  EXAM: CT HEAD WITHOUT CONTRAST  TECHNIQUE: Contiguous axial images were obtained from the base of the skull through the vertex without intravenous contrast.  COMPARISON:  October 27, 2005  FINDINGS: The ventricles are normal in size and configuration. There is no mass, hemorrhage, extra-axial fluid collection, or midline shift. Gray-white compartments are normal. No acute infarct apparent. The bony calvarium appears intact. The mastoid air cells are clear.  IMPRESSION: No intracranial mass, hemorrhage, or focal gray -white compartment lesions/acute appearing infarct.   Electronically Signed   By: Lowella Grip III M.D.   On: 11/14/2014 14:17   US Abdomen Complete  11/14/2014   CLINICAL DATA:  Pancreatic cancer post Whipple procedure 08/07/2014, abdominal swelling for 1 week, prior cholecystectomy, hypertension, radiation therapy, smoker  EXAM: ULTRASOUND ABDOMEN COMPLETE  COMPARISON:  CT abdomen and pelvis  10/16/2014  FINDINGS: Gallbladder: Surgically absent  Common bile duct: Diameter: 5 mm diameter, normal  Liver: Heterogeneous in echogenicity. No discrete mass. Portal vein is not well demonstrated ; portal vein demonstrated thrombosis on the prior CT.  IVC: Intrahepatic portion normal appearance. Infrahepatic portion nonvisualized due to bowel gas.  Pancreas: Post Whipple procedure by history. Pancreatic bed inadequately visualized due to bowel gas.  Spleen: Normal appearance, 5.0 cm length  Right Kidney: Length: 9.3 cm. Normal morphology without mass or hydronephrosis.  Left Kidney: Length: 10.0 cm. Normal morphology without mass or hydronephrosis.  Abdominal aorta: Proximally normal caliber, remainder obscured by bowel gas  Other findings: Significant ascites throughout abdomen and pelvis.  IMPRESSION: Significant ascites significantly increased since prior CT exam, likely cause of abdominal swelling.  Limited exam due to bowel gas, with inadequate visualization of the pancreatic bed, portal vein and portions of the aorta.  Post cholecystectomy.   Electronically Signed   By: Lavonia Dana M.D.   On: 11/14/2014 14:32   Ct Abdomen Pelvis W Contrast  11/14/2014   CLINICAL DATA:  64 year old female with abdominal pain and distension. Current history of pancreatic cancer status post Whipple and radiation. Subsequent encounter.  EXAM: CT ABDOMEN AND PELVIS WITH CONTRAST  TECHNIQUE: Multidetector CT imaging of the abdomen and pelvis was performed using the standard protocol following bolus administration of intravenous contrast.  CONTRAST:  116mL OMNIPAQUE IOHEXOL 300 MG/ML  SOLN  COMPARISON:  10/16/2014 and earlier.  FINDINGS: New moderate size bilateral pleural effusions with lower lobe atelectasis. No pericardial effusion.  Advanced degenerative changes in the lower lumbar spine. No acute osseous abnormality identified.  Increased body wall stranding and density in keeping with a degree of anasarca. Moderate to  large volume of ascites in the abdomen has significantly progressed, and in the upper abdomen this is associated with new lobular mass effect on the liver contour (series 2, image 17).  No dilated large bowel. Widespread small and large bowel wall thickening. Pancreatic or biliary stent re- identified within a small bowel loop in the right upper quadrant which today is dilated with fluid up to 4 cm diameter. There is continued intra and extrahepatic biliary ductal dilatation. Pancreatic tail atrophy again noted.  In January of the main portal vein was thrombosed. On today images no portal venous system enhancement is identified.  The stomach is thick walled. Oral contrast was administered, and has reached mid small bowel loops.  Major arterial structures in the abdomen and pelvis appear patent. Aortoiliac calcified atherosclerosis noted. Renal enhancement and contrast excretion is within normal limits.  IMPRESSION: 1. Progression of disease. Progressed and now diffuse portal venous system thrombosis since January. 2. Anasarca. Large volume ascites, mass effect on the liver may reflect subcapsular or loculated ascites. Diffuse moderate to severe bowel wall thickening. Moderate bilateral pleural effusions. 3. Fluid-filled and dilated small bowel loop draining the pancreas.   Electronically Signed   By: Genevie Ann M.D.   On: 11/14/2014 14:42   US Paracentesis  11/15/2014   CLINICAL DATA:  Ascites, history of pancreatic cancer. Request therapeutic and diagnostic paracentesis of up to 3 L max.  EXAM: ULTRASOUND GUIDED PARACENTESIS  COMPARISON:  None.  PROCEDURE: An ultrasound guided paracentesis was thoroughly discussed with the patient and questions answered. The benefits, risks, alternatives and complications were also discussed. The patient understands and wishes to proceed with the procedure. Written consent was obtained.  Ultrasound was performed to localize and mark an adequate pocket of fluid in the left lower  quadrant of the abdomen. The area was then prepped and draped in the normal sterile fashion. 1% Lidocaine was used for local anesthesia. Under ultrasound guidance a 19 gauge Yueh catheter was introduced. Paracentesis was performed. The catheter was removed and a dressing applied.  COMPLICATIONS: None immediate  FINDINGS: A total of approximately 3 L of clear yellow fluid was removed. A fluid sample was sent for laboratory analysis.  IMPRESSION: Successful ultrasound guided paracentesis yielding 3 L of ascites.  Read by: Ascencion Dike PA-C   Electronically Signed   By: Corrie Mckusick D.O.   On: 11/15/2014 12:55    CBC  Recent Labs Lab 11/14/14 1154 11/15/14 0857 11/16/14 0425  WBC 10.5 8.6 5.8  HGB 8.5* 8.0* 7.9*  HCT 26.0* 23.8* 23.7*  PLT 279 273 226  MCV 85.5 82.9 84.9  MCH 28.0 27.9 28.3  MCHC 32.7 33.6 33.3  RDW 20.8* 21.6* 21.9*    Chemistries   Recent Labs Lab 11/14/14 1154 11/15/14 0200 11/16/14 0425  NA 139 137 141  K <2.0* 3.1* 3.1*  CL 108 115* 116*  CO2 21 16* 21  GLUCOSE 101* 73 80  BUN 12 11 10   CREATININE 0.54 0.49* 0.44*  CALCIUM 7.7* 7.1* 7.3*  MG 1.9  --   --   AST 128* 126* 127*  ALT 53* 53* 55*  ALKPHOS 1216* 1035* 969*  BILITOT 4.9* 5.1* 5.4*   ------------------------------------------------------------------------------------------------------------------ estimated creatinine clearance is 61.1 mL/min (by C-G formula based on Cr of 0.44). ------------------------------------------------------------------------------------------------------------------ No results for input(s): HGBA1C in the last 72 hours. ------------------------------------------------------------------------------------------------------------------ No results for input(s): CHOL, HDL, LDLCALC, TRIG, CHOLHDL, LDLDIRECT in the last 72 hours. ------------------------------------------------------------------------------------------------------------------ No results for input(s): TSH,  T4TOTAL, T3FREE, THYROIDAB in the last 72 hours.  Invalid input(s): FREET3 ------------------------------------------------------------------------------------------------------------------ No results for input(s): VITAMINB12, FOLATE, FERRITIN, TIBC, IRON, RETICCTPCT in the last 72 hours.  Coagulation profile  Recent Labs Lab 11/14/14 1153  INR 1.60*    No results for input(s): DDIMER in the last 72 hours.  Cardiac Enzymes No results for input(s): CKMB, TROPONINI, MYOGLOBIN in the last 168 hours.  Invalid input(s): CK ------------------------------------------------------------------------------------------------------------------ Invalid input(s): POCBNP    Edra Riccardi D.O. on 11/16/2014 at 11:14 AM  Between 7am to 7pm - Pager - [phone]  After 7pm go to [email or web address] - password [email or web address]  And look for the night coverage person covering for me after hours  [facility] Office  [phone]

## 2014-11-16 NOTE — Progress Notes (Signed)
ANTIBIOTIC CONSULT NOTE  Pharmacy Consult for Unasyn Indication: Intra-abdominal infection, history of recurrent Klebsiella bacteremia, R/o SBP  No Known Allergies  Patient Measurements: Height: 5\' 3"  (160 cm) Weight: 126 lb 1.7 oz (57.2 kg) IBW/kg (Calculated) : 52.4  Assessment: 31 y/oF with PMH of adenocarcinoma of head of pancreas, s/p Whipple's procedure, recurrent Klebsiella bacteremia attributed to intra-abdominal source/presumed septic portal vein thrombus, acute portal vein thrombosis, hepatitis C, tobacco abuse who presents to [facility] ED with complaints of progressive mental status changes, abdominal distention/pain, and poor appetite. Patient has been receiving Unasyn through PICC line, but per ID note 2/23 was to changed to cefuroxime to complete therapy for polymicrobial bacteremia.  Pharmacy consulted today to dose Unasyn for r/o SBP, recurrent Klebsiella bacteremia. ID is following  Antiinfectives  PTA, started 1/31 >> Unasyn >> Currently on day 31 of 42 days of therapy for polymicrobial bacteremia  Labs / vitals Tmax: remains afebrile WBCs: now remains WNL Renal: SCr 0.44- stable (appears at baseline), CrCl ~61, 84N  Microbiology 1/26 blood: Klebsiella pneumoniae, Streptococcus species 2/24 blood x 2: NGTD 2/24 MRSA PCR: negative 2/25 ascites fluid: ngtd  Goal of Therapy:  Appropriate antibiotic dosing for renal function and indication Eradication of infection  Plan:   Continue Unasyn 3 g IV q6h  Monitor renal function, cultures, clinical course.  Thank you for the consult.  Currie Paris, PharmD, BCPS Pager: [phone] Pharmacy: [phone] 11/16/2014 12:24 PM

## 2014-11-17 ENCOUNTER — Inpatient Hospital Stay (HOSPITAL_COMMUNITY): Payer: Medicaid Other

## 2014-11-17 LAB — CBC
HCT: 23.7 % — ABNORMAL LOW (ref 36.0–46.0)
HEMOGLOBIN: 7.9 g/dL — AB (ref 12.0–15.0)
MCH: 28.4 pg (ref 26.0–34.0)
MCHC: 33.3 g/dL (ref 30.0–36.0)
MCV: 85.3 fL (ref 78.0–100.0)
PLATELETS: 242 10*3/uL (ref 150–400)
RBC: 2.78 MIL/uL — ABNORMAL LOW (ref 3.87–5.11)
RDW: 22.9 % — ABNORMAL HIGH (ref 11.5–15.5)
WBC: 4.5 10*3/uL (ref 4.0–10.5)

## 2014-11-17 LAB — COMPREHENSIVE METABOLIC PANEL
ALT: 56 U/L — AB (ref 0–35)
AST: 117 U/L — ABNORMAL HIGH (ref 0–37)
Albumin: 1.3 g/dL — ABNORMAL LOW (ref 3.5–5.2)
Alkaline Phosphatase: 965 U/L — ABNORMAL HIGH (ref 39–117)
Anion gap: 2 — ABNORMAL LOW (ref 5–15)
BILIRUBIN TOTAL: 6.1 mg/dL — AB (ref 0.3–1.2)
BUN: 9 mg/dL (ref 6–23)
CALCIUM: 7.2 mg/dL — AB (ref 8.4–10.5)
CO2: 20 mmol/L (ref 19–32)
Chloride: 114 mmol/L — ABNORMAL HIGH (ref 96–112)
Creatinine, Ser: 0.49 mg/dL — ABNORMAL LOW (ref 0.50–1.10)
GFR calc non Af Amer: >90 mL/min (ref >90)
GLUCOSE: 110 mg/dL — AB (ref 70–99)
POTASSIUM: 3.6 mmol/L (ref 3.5–5.1)
SODIUM: 136 mmol/L (ref 135–145)
Total Protein: 7.2 g/dL (ref 6.0–8.3)

## 2014-11-17 LAB — AMMONIA: AMMONIA: 78 umol/L — AB (ref 11–32)

## 2014-11-17 MED ORDER — HYDRALAZINE HCL 20 MG/ML IJ SOLN
5.0000 mg | Freq: Four times a day (QID) | INTRAMUSCULAR | Status: DC | PRN
  Administered 2014-11-17: 5 mg via INTRAVENOUS
  Filled 2014-11-17: qty 1

## 2014-11-17 MED ORDER — LACTULOSE ENEMA
300.0000 mL | Freq: Once | ORAL | Status: AC
  Administered 2014-11-17: 300 mL via RECTAL
  Filled 2014-11-17: qty 300

## 2014-11-17 MED ORDER — SODIUM CHLORIDE 0.9 % IJ SOLN
10.0000 mL | INTRAMUSCULAR | Status: DC | PRN
  Administered 2014-11-18 – 2014-11-22 (×4): 10 mL
  Administered 2014-11-25: 20 mL
  Administered 2014-11-26 – 2014-11-28 (×3): 10 mL
  Filled 2014-11-17 (×8): qty 40

## 2014-11-17 NOTE — Progress Notes (Signed)
Progress Note for [facility]  Subjective: Feeling better after paracentesis, but still with abdominal tenderness.  Objective: Vital signs in last 24 hours: Temp:  [98.1 F (36.7 C)-98.6 F (37 C)] 98.3 F (36.8 C) (02/27 0800) Pulse Rate:  [87-102] 89 (02/27 1000) Resp:  [15-27] 18 (02/27 1000) BP: (131-170)/(78-111) 131/78 mmHg (02/27 1029) SpO2:  [98 %-100 %] 100 % (02/27 0600) Weight:  [61.4 kg (135 lb 5.8 oz)] 61.4 kg (135 lb 5.8 oz) (02/27 0400) Last BM Date: 11/15/14  Intake/Output from previous day: 02/26 0701 - 02/27 0700 In: 2706.3 [P.O.:1440; I.V.:866.3; IV Piggyback:400] Out: 415 [Urine:415] Intake/Output this shift: Total I/O In: 210 [P.O.:60; I.V.:150] Out: 120 [Urine:120]  General appearance: alert, fatigued and mild distress GI: diffusely tender, soft  Lab Results:  Recent Labs  11/15/14 0857 11/16/14 0425 11/17/14 0505  WBC 8.6 5.8 4.5  HGB 8.0* 7.9* 7.9*  HCT 23.8* 23.7* 23.7*  PLT 273 226 242   BMET  Recent Labs  11/15/14 0200 11/16/14 0425 11/17/14 0505  NA 137 141 136  K 3.1* 3.1* 3.6  CL 115* 116* 114*  CO2 16* 21 20  GLUCOSE 73 80 110*  BUN 11 10 9   CREATININE 0.49* 0.44* 0.49*  CALCIUM 7.1* 7.3* 7.2*   LFT  Recent Labs  11/17/14 0505  PROT 7.2  ALBUMIN 1.3*  AST 117*  ALT 56*  ALKPHOS 965*  BILITOT 6.1*   PT/INR  Recent Labs  11/14/14 1153  LABPROT 19.2*  INR 1.60*   Hepatitis Panel No results for input(s): HEPBSAG, HCVAB, HEPAIGM, HEPBIGM in the last 72 hours. C-Diff No results for input(s): CDIFFTOX in the last 72 hours. Fecal Lactopherrin No results for input(s): FECLLACTOFRN in the last 72 hours.  Studies/Results: Dg Abd 1 View  11/16/2014   CLINICAL DATA:  Abdominal pain and distention.  EXAM: ABDOMEN - 1 VIEW  COMPARISON:  CT on 11/14/2014  FINDINGS: Chronic and bowel loops in the central abdomen is consistent with ascites. Surgical clips and piece of catheter or stent again seen in right upper  quadrant. Mild dilatation of small bowel loops and colon is seen in the central abdomen likely due to on adynamic ileus. Some bowel gas in contrast is seen in the rectum.  IMPRESSION: Ascites and probable adynamic ileus.   Electronically Signed   By: Earle Gell M.D.   On: 11/16/2014 16:06   US Abdomen Limited  11/16/2014   CLINICAL DATA:  Abdominal distention question ascites, history Whipple procedure, hypertension, pancreatic cancer  EXAM: LIMITED ABDOMEN ULTRASOUND FOR ASCITES  TECHNIQUE: Limited ultrasound survey for ascites was performed in all four abdominal quadrants.  COMPARISON:  11/14/2014 CT abdomen and pelvis  FINDINGS: Significant ascites is identified in both lower quadrants.  Smaller amount of ascites is seen in the LEFT upper quadrant, with even less in the perihepatic region.  IMPRESSION: Significant ascites.   Electronically Signed   By: Lavonia Dana M.D.   On: 11/16/2014 17:34   US Paracentesis  11/15/2014   CLINICAL DATA:  Ascites, history of pancreatic cancer. Request therapeutic and diagnostic paracentesis of up to 3 L max.  EXAM: ULTRASOUND GUIDED PARACENTESIS  COMPARISON:  None.  PROCEDURE: An ultrasound guided paracentesis was thoroughly discussed with the patient and questions answered. The benefits, risks, alternatives and complications were also discussed. The patient understands and wishes to proceed with the procedure. Written consent was obtained.  Ultrasound was performed to localize and mark an adequate pocket of fluid in the left lower  quadrant of the abdomen. The area was then prepped and draped in the normal sterile fashion. 1% Lidocaine was used for local anesthesia. Under ultrasound guidance a 19 gauge Yueh catheter was introduced. Paracentesis was performed. The catheter was removed and a dressing applied.  COMPLICATIONS: None immediate  FINDINGS: A total of approximately 3 L of clear yellow fluid was removed. A fluid sample was sent for laboratory analysis.  IMPRESSION:  Successful ultrasound guided paracentesis yielding 3 L of ascites.  Read by: Ascencion Dike PA-C   Electronically Signed   By: Corrie Mckusick D.O.   On: 11/15/2014 12:55    Medications:  Scheduled: . ampicillin-sulbactam (UNASYN) IV  3 g Intravenous 4 times per day  . antiseptic oral rinse  7 mL Mouth Rinse q12n4p  . chlorhexidine  15 mL Mouth Rinse BID  . feeding supplement (RESOURCE BREEZE)  1 Container Oral TID BM  . lactulose  300 mL Rectal Once  . pantoprazole (PROTONIX) IV  40 mg Intravenous Q24H  . sodium chloride  3 mL Intravenous Q12H   Continuous: . sodium chloride 75 mL/hr at 11/17/14 0759    Assessment/Plan: 1) Portal vein thrombosis. 2) Ascites. 3) Failure to thrive s/p Whipple's for pancreatic adenocarcinoma. 4) Klebsiella bacteremia followed by ID.   I evaluated the patient immediately after her paracentesis.  3 liters were removed and she feels better, but she is very weak appearing.  Her prognosis is poor.  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  Her SAAG is difficult to calculate as the ascitic albumin is no specifically quantified, i.e., it is only reported as being <1.    Plan: 1) Continue with supportive care. 2) Await cytology. 3) I agree with Dr. Fuller Plan, per my conversation with him, Palliative care/Hospice needs to be considered.  LOS: 3 days   Marcel Sorter D 11/17/2014, 10:42 AM

## 2014-11-17 NOTE — Progress Notes (Addendum)
[facility]                                                                              Patient Demographics  Melinda Conrad, is a 64 y.o. female, DOB - 1951/08/13, NWG:[id number]  Admit date - 11/14/2014   Admitting Physician Modena Jansky, MD  Outpatient Primary MD for the patient is Marlou Sa, ERIC, MD  LOS - 3   Chief Complaint  Patient presents with  . Ascites  . Weakness  . Altered Mental Status      HPI on 11/14/2014 by Dr. [name] Melinda Conrad is a 64 y.o. female with history of adenocarcinoma of head of pancreas, status post Whipple's procedure 08/07/14, completed concurrent capecitabine and radiation, poor performance status and hence did not get adjuvant chemotherapy, recurrent Klebsiella bacteremia- attributed to intra-abdominal source/presumed septic portal vein thrombus, acute portal vein thrombosis-not on anticoagulation secondary to GI bleed, tobacco abuse & hepatitis C presented to the [facility] ED on 11/14/14 with complaints of progressive mental status changes, abdominal distention/pain and poor appetite. Patient unable to provide history secondary to AMS. No family at bedside currently. History obtained from EDP who discussed with family. Patient apparently has been having progressive failure to thrive since Whipple's procedure. However over the last 3 days, family have noticed gradually worsening confusion, disorientation and lethargic to a point of near unresponsiveness today, decreased oral intake of liquids and food but no nausea or vomiting, progressive abdominal and lower extremity swelling with intermittent abdominal pain and no fevers. Patient has been getting IV antibiotics through right upper extremity PICC line and therefore plans to remove PICC line and from IV Unasyn to oral cefuroxime. In the ED, patient is somnolent but arousable, afebrile, elevated blood pressure, lab work significant for potassium <2, abnormal LFTs, ammonia 120,  hemoglobin 8.5, CT head without acute findings, CT abdomen and pelvis: Diffuse portal vein system thrombosis, anasarca, diffuse moderate to severe bowel wall thickening, moderate bilateral pleural effusions and fluid filled and dilated small bowel loop draining the pancreas. Hospitalist admission was requested.  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/hepatitis C -Improving slightly, but continues to have confusion (unknown baseline) -CT of the head: No acute findings -Blood cultures: no growth to date -Gastroenterology consulted and appreciated -Once patient is able to tolerate by mouth's, will add on rifaximin (per GI) -Ammonia level upon admission 120, now 78 -Continue lactulose enema  Anasarca/ascites -Secondary to liver disease, portal vein thrombosis and hypoalbuminemia with malnutrition -Interventional radiology consult appreciated -Patient had ultrasound-guided paracentesis with removal of 3 L (11/15/2014) -Patient empirically started on IV Unasyn to cover for possible SBP (as patient has had klebseilla bacteremia in the 2015) -Fluid cytology pending -Fluid culture: no growth to date, no organisms seen -Fluid chemistry not consistent with SBP -SAAG: 0.3; given her history of pancreatitic cancer, suspect carcinomatosis??? -Patient continues to have a firm abdomen with pain- US showed significant ascites on 11/16/2014 -Repeat US paracentesis on 11/17/2014 removed 3L -Spoke with daughter regarding prognosis and Code status.  She would like to speak to her brother first.  She is amendable to palliative care consult. -Palliative care consulted.  Previous Klebsiella  and streptococcal bacteremia -ID consulted and appreciated -Patient currently on 32 or 42 day course of antibiotics -ID recommended Cefuroxime 500mg  BID through March 8th when patient discharged- so she no longer needs PICC line -Thus far blood cultures and fluid cultures negative  Severe  hypokalemia -Magnesium 1.9 -Resolved, Continue to replace and monitor BMP  Dehydration -Secondary to poor oral intake and continue IV fluids  Portal vein thrombosis -Patient has had history of GI bleed is currently not anticoagulation candidate  Anemia secondary to chronic disease -Baseline approximately 8, currently 7.9 -Will transfuse if below 7  Hepatitis C -Currently not on treatment  Severe protein calorie malnutrition/failure to thrive -Nutrition consulted and appreciated -Patient started on clear liquid diet with nutritional supplementation  Pancreatic adenocarcinoma -S/p Whipple's procedure -Oncology consulted and appreciated  ?Small bowel ileus  Code Status: Full  Family Communication: None at bedside; Spoke with daughter, who is POA.   Disposition Plan: Admitted, continue to monitor, pending cytology.  Will transfer to medical floor.  Time Spent in minutes   30 minutes  Procedures  Ultrasound guided paracentesis 2/25 and 2/27  Consults   Interventional radiology Gastroenterology General surgery Oncology Infectious Disease Palliative care  DVT Prophylaxis  SCDs  Lab Results  Component Value Date   PLT 242 11/17/2014    Medications  Scheduled Meds: . ampicillin-sulbactam (UNASYN) IV  3 g Intravenous 4 times per day  . antiseptic oral rinse  7 mL Mouth Rinse q12n4p  . chlorhexidine  15 mL Mouth Rinse BID  . feeding supplement (RESOURCE BREEZE)  1 Container Oral TID BM  . lactulose  300 mL Rectal Once  . pantoprazole (PROTONIX) IV  40 mg Intravenous Q24H  . sodium chloride  3 mL Intravenous Q12H   Continuous Infusions: . sodium chloride 75 mL/hr at 11/17/14 0759   PRN Meds:.acetaminophen, albuterol, hydrALAZINE, morphine injection, ondansetron (ZOFRAN) IV  Antibiotics    Anti-infectives    Start     Dose/Rate Route Frequency Ordered Stop   11/15/14 0000  Ampicillin-Sulbactam (UNASYN) 3 g in sodium chloride 0.9 % 100 mL IVPB     3 g 100  mL/hr over 60 Minutes Intravenous 4 times per day 11/14/14 1616     11/14/14 1615  Ampicillin-Sulbactam (UNASYN) 3 g in sodium chloride 0.9 % 100 mL IVPB     3 g 100 mL/hr over 60 Minutes Intravenous STAT 11/14/14 1609 11/14/14 1827   11/14/14 1600  ampicillin-sulbactam (UNASYN) 1.5 g in sodium chloride 0.9 % 50 mL IVPB  Status:  Discontinued     1.5 g 100 mL/hr over 30 Minutes Intravenous  Once 11/14/14 1527 11/14/14 1609        Subjective:   Melinda Conrad seen and examined today.  Patient continues to complain of abdominal pain.  Denies nausea or vomiting.    Objective:   Filed Vitals:   11/17/14 1000 11/17/14 1010 11/17/14 1022 11/17/14 1029  BP: 139/88 141/81 134/81 131/78  Pulse: 89     Temp:      TempSrc:      Resp: 18     Height:      Weight:      SpO2:        Wt Readings from Last 3 Encounters:  11/17/14 61.4 kg (135 lb 5.8 oz)  10/29/14 50.395 kg (111 lb 1.6 oz)  10/17/14 45.6 kg (100 lb 8.5 oz)     Intake/Output Summary (Last 24 hours) at 11/17/14 1045 Last data filed at 11/17/14 0900  Gross  per 24 hour  Intake 2676.25 ml  Output    455 ml  Net 2221.25 ml    Exam  General: Well developed, thin, NAD  HEENT: NCAT, mucous membranes dry  Cardiovascular: S1 S2 auscultated,RRR  Respiratory: Diminished breath sounds  Abdomen: Firm, distended, diffusely tender, hypoactive bowel sounds  Extremities: warm dry without cyanosis clubbing. +LE edema B/L  Neuro: Awake and alert, oriented to self and place.  Remains confused.  Able to follow commands.   Data Review   Micro Results Recent Results (from the past 240 hour(s))  Blood culture (routine x 2)     Status: None (Preliminary result)   Collection Time: 11/14/14 12:30 PM  Result Value Ref Range Status   Specimen Description BLOOD RIGHT HAND  Final   Special Requests BOTTLES DRAWN AEROBIC ONLY 4CC  Final   Culture   Final           BLOOD CULTURE RECEIVED NO GROWTH TO DATE CULTURE WILL BE HELD FOR  5 DAYS BEFORE ISSUING A FINAL NEGATIVE REPORT Performed at [facility]    Report Status PENDING  Incomplete  MRSA PCR Screening     Status: None   Collection Time: 11/14/14  5:15 PM  Result Value Ref Range Status   MRSA by PCR NEGATIVE NEGATIVE Final    Comment:        The GeneXpert MRSA Assay (FDA approved for NASAL specimens only), is one component of a comprehensive MRSA colonization surveillance program. It is not intended to diagnose MRSA infection nor to guide or monitor treatment for MRSA infections.   Body fluid culture     Status: None (Preliminary result)   Collection Time: 11/15/14 12:10 PM  Result Value Ref Range Status   Specimen Description ASCITIC  Final   Special Requests NONE  Final   Gram Stain   Final    RARE WBC PRESENT, PREDOMINANTLY PMN NO ORGANISMS SEEN Performed at [facility]    Culture   Final    NO GROWTH 2 DAYS Performed at [facility]    Report Status PENDING  Incomplete  Anaerobic culture     Status: None (Preliminary result)   Collection Time: 11/15/14 12:10 PM  Result Value Ref Range Status   Specimen Description FLUID ASCITIES  Final   Special Requests NONE  Final   Gram Stain   Final    RARE WBC PRESENT, PREDOMINANTLY PMN NO ORGANISMS SEEN Performed at [facility]    Culture   Final    NO ANAEROBES ISOLATED; CULTURE IN PROGRESS FOR 5 DAYS Performed at [facility]    Report Status PENDING  Incomplete    Radiology Reports Dg Abd 1 View  11/16/2014   CLINICAL DATA:  Abdominal pain and distention.  EXAM: ABDOMEN - 1 VIEW  COMPARISON:  CT on 11/14/2014  FINDINGS: Chronic and bowel loops in the central abdomen is consistent with ascites. Surgical clips and piece of catheter or stent again seen in right upper quadrant. Mild dilatation of small bowel loops and colon is seen in the central abdomen likely due to on adynamic ileus. Some bowel gas in contrast is seen in the rectum.  IMPRESSION:  Ascites and probable adynamic ileus.   Electronically Signed   By: Earle Gell M.D.   On: 11/16/2014 16:06   Ct Head Wo Contrast  11/14/2014   CLINICAL DATA:  Altered mental status  EXAM: CT HEAD WITHOUT CONTRAST  TECHNIQUE: Contiguous axial images were obtained  from the base of the skull through the vertex without intravenous contrast.  COMPARISON:  October 27, 2005  FINDINGS: The ventricles are normal in size and configuration. There is no mass, hemorrhage, extra-axial fluid collection, or midline shift. Gray-white compartments are normal. No acute infarct apparent. The bony calvarium appears intact. The mastoid air cells are clear.  IMPRESSION: No intracranial mass, hemorrhage, or focal gray -white compartment lesions/acute appearing infarct.   Electronically Signed   By: Lowella Grip III M.D.   On: 11/14/2014 14:17   US Abdomen Complete  11/14/2014   CLINICAL DATA:  Pancreatic cancer post Whipple procedure 08/07/2014, abdominal swelling for 1 week, prior cholecystectomy, hypertension, radiation therapy, smoker  EXAM: ULTRASOUND ABDOMEN COMPLETE  COMPARISON:  CT abdomen and pelvis 10/16/2014  FINDINGS: Gallbladder: Surgically absent  Common bile duct: Diameter: 5 mm diameter, normal  Liver: Heterogeneous in echogenicity. No discrete mass. Portal vein is not well demonstrated ; portal vein demonstrated thrombosis on the prior CT.  IVC: Intrahepatic portion normal appearance. Infrahepatic portion nonvisualized due to bowel gas.  Pancreas: Post Whipple procedure by history. Pancreatic bed inadequately visualized due to bowel gas.  Spleen: Normal appearance, 5.0 cm length  Right Kidney: Length: 9.3 cm. Normal morphology without mass or hydronephrosis.  Left Kidney: Length: 10.0 cm. Normal morphology without mass or hydronephrosis.  Abdominal aorta: Proximally normal caliber, remainder obscured by bowel gas  Other findings: Significant ascites throughout abdomen and pelvis.  IMPRESSION: Significant ascites  significantly increased since prior CT exam, likely cause of abdominal swelling.  Limited exam due to bowel gas, with inadequate visualization of the pancreatic bed, portal vein and portions of the aorta.  Post cholecystectomy.   Electronically Signed   By: Lavonia Dana M.D.   On: 11/14/2014 14:32   Ct Abdomen Pelvis W Contrast  11/14/2014   CLINICAL DATA:  64 year old female with abdominal pain and distension. Current history of pancreatic cancer status post Whipple and radiation. Subsequent encounter.  EXAM: CT ABDOMEN AND PELVIS WITH CONTRAST  TECHNIQUE: Multidetector CT imaging of the abdomen and pelvis was performed using the standard protocol following bolus administration of intravenous contrast.  CONTRAST:  113mL OMNIPAQUE IOHEXOL 300 MG/ML  SOLN  COMPARISON:  10/16/2014 and earlier.  FINDINGS: New moderate size bilateral pleural effusions with lower lobe atelectasis. No pericardial effusion.  Advanced degenerative changes in the lower lumbar spine. No acute osseous abnormality identified.  Increased body wall stranding and density in keeping with a degree of anasarca. Moderate to large volume of ascites in the abdomen has significantly progressed, and in the upper abdomen this is associated with new lobular mass effect on the liver contour (series 2, image 17).  No dilated large bowel. Widespread small and large bowel wall thickening. Pancreatic or biliary stent re- identified within a small bowel loop in the right upper quadrant which today is dilated with fluid up to 4 cm diameter. There is continued intra and extrahepatic biliary ductal dilatation. Pancreatic tail atrophy again noted.  In January of the main portal vein was thrombosed. On today images no portal venous system enhancement is identified.  The stomach is thick walled. Oral contrast was administered, and has reached mid small bowel loops.  Major arterial structures in the abdomen and pelvis appear patent. Aortoiliac calcified  atherosclerosis noted. Renal enhancement and contrast excretion is within normal limits.  IMPRESSION: 1. Progression of disease. Progressed and now diffuse portal venous system thrombosis since January. 2. Anasarca. Large volume ascites, mass effect on the liver may  reflect subcapsular or loculated ascites. Diffuse moderate to severe bowel wall thickening. Moderate bilateral pleural effusions. 3. Fluid-filled and dilated small bowel loop draining the pancreas.   Electronically Signed   By: Genevie Ann M.D.   On: 11/14/2014 14:42   US Abdomen Limited  11/16/2014   CLINICAL DATA:  Abdominal distention question ascites, history Whipple procedure, hypertension, pancreatic cancer  EXAM: LIMITED ABDOMEN ULTRASOUND FOR ASCITES  TECHNIQUE: Limited ultrasound survey for ascites was performed in all four abdominal quadrants.  COMPARISON:  11/14/2014 CT abdomen and pelvis  FINDINGS: Significant ascites is identified in both lower quadrants.  Smaller amount of ascites is seen in the LEFT upper quadrant, with even less in the perihepatic region.  IMPRESSION: Significant ascites.   Electronically Signed   By: Lavonia Dana M.D.   On: 11/16/2014 17:34   US Paracentesis  11/15/2014   CLINICAL DATA:  Ascites, history of pancreatic cancer. Request therapeutic and diagnostic paracentesis of up to 3 L max.  EXAM: ULTRASOUND GUIDED PARACENTESIS  COMPARISON:  None.  PROCEDURE: An ultrasound guided paracentesis was thoroughly discussed with the patient and questions answered. The benefits, risks, alternatives and complications were also discussed. The patient understands and wishes to proceed with the procedure. Written consent was obtained.  Ultrasound was performed to localize and mark an adequate pocket of fluid in the left lower quadrant of the abdomen. The area was then prepped and draped in the normal sterile fashion. 1% Lidocaine was used for local anesthesia. Under ultrasound guidance a 19 gauge Yueh catheter was introduced.  Paracentesis was performed. The catheter was removed and a dressing applied.  COMPLICATIONS: None immediate  FINDINGS: A total of approximately 3 L of clear yellow fluid was removed. A fluid sample was sent for laboratory analysis.  IMPRESSION: Successful ultrasound guided paracentesis yielding 3 L of ascites.  Read by: Ascencion Dike PA-C   Electronically Signed   By: Corrie Mckusick D.O.   On: 11/15/2014 12:55    CBC  Recent Labs Lab 11/14/14 1154 11/15/14 0857 11/16/14 0425 11/17/14 0505  WBC 10.5 8.6 5.8 4.5  HGB 8.5* 8.0* 7.9* 7.9*  HCT 26.0* 23.8* 23.7* 23.7*  PLT 279 273 226 242  MCV 85.5 82.9 84.9 85.3  MCH 28.0 27.9 28.3 28.4  MCHC 32.7 33.6 33.3 33.3  RDW 20.8* 21.6* 21.9* 22.9*    Chemistries   Recent Labs Lab 11/14/14 1154 11/15/14 0200 11/16/14 0425 11/17/14 0505  NA 139 137 141 136  K <2.0* 3.1* 3.1* 3.6  CL 108 115* 116* 114*  CO2 21 16* 21 20  GLUCOSE 101* 73 80 110*  BUN 12 11 10 9   CREATININE 0.54 0.49* 0.44* 0.49*  CALCIUM 7.7* 7.1* 7.3* 7.2*  MG 1.9  --   --   --   AST 128* 126* 127* 117*  ALT 53* 53* 55* 56*  ALKPHOS 1216* 1035* 969* 965*  BILITOT 4.9* 5.1* 5.4* 6.1*   ------------------------------------------------------------------------------------------------------------------ estimated creatinine clearance is 61.1 mL/min (by C-G formula based on Cr of 0.49). ------------------------------------------------------------------------------------------------------------------ No results for input(s): HGBA1C in the last 72 hours. ------------------------------------------------------------------------------------------------------------------ No results for input(s): CHOL, HDL, LDLCALC, TRIG, CHOLHDL, LDLDIRECT in the last 72 hours. ------------------------------------------------------------------------------------------------------------------ No results for input(s): TSH, T4TOTAL, T3FREE, THYROIDAB in the last 72 hours.  Invalid input(s):  FREET3 ------------------------------------------------------------------------------------------------------------------ No results for input(s): VITAMINB12, FOLATE, FERRITIN, TIBC, IRON, RETICCTPCT in the last 72 hours.  Coagulation profile  Recent Labs Lab 11/14/14 1153  INR 1.60*    No results for input(s):  DDIMER in the last 72 hours.  Cardiac Enzymes No results for input(s): CKMB, TROPONINI, MYOGLOBIN in the last 168 hours.  Invalid input(s): CK ------------------------------------------------------------------------------------------------------------------ Invalid input(s): POCBNP    Shaliah Wann D.O. on 11/17/2014 at 10:45 AM  Between 7am to 7pm - Pager - [phone]  After 7pm go to [email or web address] - password [email or web address]  And look for the night coverage person covering for me after hours  [facility] Office  [phone]

## 2014-11-17 NOTE — Procedures (Signed)
Successful US guided paracentesis from RLQ.  Yielded 3 Liters of serous fluid.  No immediate complications.  Pt tolerated well.   Specimen was not sent for labs.  Tsosie Billing D PA-C 11/17/2014 10:21 AM

## 2014-11-18 LAB — COMPREHENSIVE METABOLIC PANEL
ALBUMIN: 1.2 g/dL — AB (ref 3.5–5.2)
ALT: 50 U/L — AB (ref 0–35)
AST: 100 U/L — ABNORMAL HIGH (ref 0–37)
Alkaline Phosphatase: 905 U/L — ABNORMAL HIGH (ref 39–117)
Anion gap: 4 — ABNORMAL LOW (ref 5–15)
BUN: 7 mg/dL (ref 6–23)
CALCIUM: 7.5 mg/dL — AB (ref 8.4–10.5)
CO2: 20 mmol/L (ref 19–32)
CREATININE: 0.33 mg/dL — AB (ref 0.50–1.10)
Chloride: 113 mmol/L — ABNORMAL HIGH (ref 96–112)
GFR calc Af Amer: >90 mL/min (ref >90)
GFR calc non Af Amer: >90 mL/min (ref >90)
Glucose, Bld: 93 mg/dL (ref 70–99)
Potassium: 3.1 mmol/L — ABNORMAL LOW (ref 3.5–5.1)
Sodium: 137 mmol/L (ref 135–145)
TOTAL PROTEIN: 6.9 g/dL (ref 6.0–8.3)
Total Bilirubin: 6.3 mg/dL — ABNORMAL HIGH (ref 0.3–1.2)

## 2014-11-18 LAB — CBC
HCT: 23.2 % — ABNORMAL LOW (ref 36.0–46.0)
HEMOGLOBIN: 7.6 g/dL — AB (ref 12.0–15.0)
MCH: 28.1 pg (ref 26.0–34.0)
MCHC: 32.8 g/dL (ref 30.0–36.0)
MCV: 85.9 fL (ref 78.0–100.0)
Platelets: 219 10*3/uL (ref 150–400)
RBC: 2.7 MIL/uL — AB (ref 3.87–5.11)
RDW: 23.9 % — AB (ref 11.5–15.5)
WBC: 5.7 10*3/uL (ref 4.0–10.5)

## 2014-11-18 LAB — BODY FLUID CULTURE: Culture: NO GROWTH

## 2014-11-18 LAB — AMMONIA: Ammonia: 72 umol/L — ABNORMAL HIGH (ref 11–32)

## 2014-11-18 MED ORDER — POTASSIUM CHLORIDE CRYS ER 20 MEQ PO TBCR
40.0000 meq | EXTENDED_RELEASE_TABLET | Freq: Two times a day (BID) | ORAL | Status: DC
  Administered 2014-11-18 – 2014-11-23 (×10): 40 meq via ORAL
  Filled 2014-11-18 (×12): qty 2

## 2014-11-18 MED ORDER — LACTULOSE ENEMA
300.0000 mL | Freq: Once | ORAL | Status: DC
  Filled 2014-11-18: qty 300

## 2014-11-18 MED ORDER — ALBUMIN HUMAN 25 % IV SOLN
50.0000 g | Freq: Once | INTRAVENOUS | Status: AC
  Administered 2014-11-18: 50 g via INTRAVENOUS
  Filled 2014-11-18: qty 200

## 2014-11-18 MED ORDER — POTASSIUM CHLORIDE 20 MEQ PO PACK: 40.0000 meq | PACK | Freq: Once | ORAL | Status: DC

## 2014-11-18 MED ORDER — MORPHINE SULFATE 2 MG/ML IJ SOLN
2.0000 mg | INTRAMUSCULAR | Status: DC | PRN
  Administered 2014-11-18: 1 mg via INTRAVENOUS
  Administered 2014-11-18 – 2014-11-20 (×8): 2 mg via INTRAVENOUS
  Filled 2014-11-18 (×10): qty 1

## 2014-11-18 NOTE — Progress Notes (Signed)
Subjective: No acute events.  Complains of back and abdominal pain.  Pain medication is helping.  Objective: Vital signs in last 24 hours: Temp:  [98.2 F (36.8 C)-98.6 F (37 C)] 98.6 F (37 C) (02/28 0618) Pulse Rate:  [81-102] 88 (02/28 0618) Resp:  [15-23] 18 (02/28 0618) BP: (131-154)/(78-110) 145/78 mmHg (02/28 0618) SpO2:  [98 %-100 %] 100 % (02/28 0618) Weight:  [61.2 kg (134 lb 14.7 oz)] 61.2 kg (134 lb 14.7 oz) (02/28 0500) Last BM Date: 11/18/14  Intake/Output from previous day: 02/27 0701 - 02/28 0700 In: 1221.3 [P.O.:180; I.V.:841.3; IV Piggyback:200] Out: 1685 [Urine:1685] Intake/Output this shift:    General appearance: alert, fatigued and no distress GI: distended with ascites, soft  Lab Results:  Recent Labs  11/16/14 0425 11/17/14 0505 11/18/14 0545  WBC 5.8 4.5 5.7  HGB 7.9* 7.9* 7.6*  HCT 23.7* 23.7* 23.2*  PLT 226 242 219   BMET  Recent Labs  11/16/14 0425 11/17/14 0505 11/18/14 0545  NA 141 136 137  K 3.1* 3.6 3.1*  CL 116* 114* 113*  CO2 21 20 20   GLUCOSE 80 110* 93  BUN 10 9 7   CREATININE 0.44* 0.49* 0.33*  CALCIUM 7.3* 7.2* 7.5*   LFT  Recent Labs  11/18/14 0545  PROT 6.9  ALBUMIN 1.2*  AST 100*  ALT 50*  ALKPHOS 905*  BILITOT 6.3*   PT/INR No results for input(s): LABPROT, INR in the last 72 hours. Hepatitis Panel No results for input(s): HEPBSAG, HCVAB, HEPAIGM, HEPBIGM in the last 72 hours. C-Diff No results for input(s): CDIFFTOX in the last 72 hours. Fecal Lactopherrin No results for input(s): FECLLACTOFRN in the last 72 hours.  Studies/Results: Dg Abd 1 View  11/16/2014   CLINICAL DATA:  Abdominal pain and distention.  EXAM: ABDOMEN - 1 VIEW  COMPARISON:  CT on 11/14/2014  FINDINGS: Chronic and bowel loops in the central abdomen is consistent with ascites. Surgical clips and piece of catheter or stent again seen in right upper quadrant. Mild dilatation of small bowel loops and colon is seen in the central  abdomen likely due to on adynamic ileus. Some bowel gas in contrast is seen in the rectum.  IMPRESSION: Ascites and probable adynamic ileus.   Electronically Signed   By: Earle Gell M.D.   On: 11/16/2014 16:06   US Abdomen Limited  11/16/2014   CLINICAL DATA:  Abdominal distention question ascites, history Whipple procedure, hypertension, pancreatic cancer  EXAM: LIMITED ABDOMEN ULTRASOUND FOR ASCITES  TECHNIQUE: Limited ultrasound survey for ascites was performed in all four abdominal quadrants.  COMPARISON:  11/14/2014 CT abdomen and pelvis  FINDINGS: Significant ascites is identified in both lower quadrants.  Smaller amount of ascites is seen in the LEFT upper quadrant, with even less in the perihepatic region.  IMPRESSION: Significant ascites.   Electronically Signed   By: Lavonia Dana M.D.   On: 11/16/2014 17:34   US Paracentesis  11/17/2014   INDICATION: ascites  CLINICAL DATA:  Request for therapeutic paracentesis  EXAM: ULTRASOUND-GUIDED PARACENTESIS  ULTRASOUND GUIDED RLQ PARACENTESIS  COMPARISON:  None.  MEDICATIONS: 10 cc 1% lidocaine  COMPLICATIONS: None immediate  TECHNIQUE: Informed written consent was obtained from the patient after a discussion of the risks, benefits and alternatives to treatment. A timeout was performed prior to the initiation of the procedure.  Initial ultrasound scanning demonstrates a large amount of ascites within the right lower abdominal quadrant. The right lower abdomen was prepped and draped in the usual  sterile fashion. 1% lidocaine with epinephrine was used for local anesthesia. Under direct ultrasound guidance, a 19 gauge, 7-cm, Yueh catheter was introduced. An ultrasound image was saved for documentation purposed. the paracentesis was performed. The catheter was removed and a dressing was applied. The patient tolerated the procedure well without immediate post procedural complication.  FINDINGS: A total of approximately 3 liters of serous fluid was removed.   IMPRESSION: Successful ultrasound-guided paracentesis yielding 3 liters of peritoneal fluid.  PROCEDURE: Ultrasound was performed to localize and mark an adequate pocket of fluid in the right quadrant of the abdomen. The area was then prepped and draped in the normal sterile fashion. 1% Lidocaine was used for local anesthesia. Under ultrasound guidance a 19 gauge Yueh catheter was introduced. Paracentesis was performed. The catheter was removed and a dressing applied.  Read by:  Lavonia Drafts [facility]   Electronically Signed   By: Jacqulynn Cadet M.D.   On: 11/17/2014 11:31    Medications:  Scheduled: . ampicillin-sulbactam (UNASYN) IV  3 g Intravenous 4 times per day  . antiseptic oral rinse  7 mL Mouth Rinse q12n4p  . chlorhexidine  15 mL Mouth Rinse BID  . feeding supplement (RESOURCE BREEZE)  1 Container Oral TID BM  . pantoprazole (PROTONIX) IV  40 mg Intravenous Q24H  . sodium chloride  3 mL Intravenous Q12H   Continuous: . sodium chloride 75 mL/hr at 11/17/14 2114    Assessment/Plan: 1) Portal vein thrombosis. 2) Ascites. 3) Failure to thrive s/p Whipple's for pancreatic adenocarcinoma. 4) Klebsiella bacteremia.   No significant changed from yesterday.  Three liters removed yesterday, but it appears that she may have accumulated.  Her abdomen does seem to be fuller today.  Plan: 1) Await cytology results. 2) Paracentesis PRN.  LOS: 4 days   Jeremie Abdelaziz D 11/18/2014, 7:54 AM

## 2014-11-18 NOTE — Progress Notes (Addendum)
[facility]                                                                              Patient Demographics  Melinda Conrad, is a 64 y.o. female, DOB - [birth year], BVQ:[id number]  Admit date - 11/14/2014   Admitting Physician Modena Jansky, MD  Outpatient Primary MD for the patient is Marlou Sa, ERIC, MD  LOS - 4   Chief Complaint  Patient presents with  . Ascites  . Weakness  . Altered Mental Status      HPI on 11/14/2014 by Dr. [name] Melinda Conrad is a 64 y.o. female with history of adenocarcinoma of head of pancreas, status post Whipple's procedure 08/07/14, completed concurrent capecitabine and radiation, poor performance status and hence did not get adjuvant chemotherapy, recurrent Klebsiella bacteremia- attributed to intra-abdominal source/presumed septic portal vein thrombus, acute portal vein thrombosis-not on anticoagulation secondary to GI bleed, tobacco abuse & hepatitis C presented to the [facility] ED on 11/14/14 with complaints of progressive mental status changes, abdominal distention/pain and poor appetite. Patient unable to provide history secondary to AMS. No family at bedside currently. History obtained from EDP who discussed with family. Patient apparently has been having progressive failure to thrive since Whipple's procedure. However over the last 3 days, family have noticed gradually worsening confusion, disorientation and lethargic to a point of near unresponsiveness today, decreased oral intake of liquids and food but no nausea or vomiting, progressive abdominal and lower extremity swelling with intermittent abdominal pain and no fevers. Patient has been getting IV antibiotics through right upper extremity PICC line and therefore plans to remove PICC line and from IV Unasyn to oral cefuroxime. In the ED, patient is somnolent but arousable, afebrile, elevated blood pressure, lab work significant for potassium <2, abnormal LFTs, ammonia 120,  hemoglobin 8.5, CT head without acute findings, CT abdomen and pelvis: Diffuse portal vein system thrombosis, anasarca, diffuse moderate to severe bowel wall thickening, moderate bilateral pleural effusions and fluid filled and dilated small bowel loop draining the pancreas. Hospitalist admission was requested.  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/hepatitis C -Improved -CT of the head: No acute findings -Blood cultures: no growth to date -Gastroenterology consulted and appreciated -Ammonia level upon admission 120, now 72 -Continue lactulose enema  Anasarca/ascites -Secondary to liver disease, portal vein thrombosis and hypoalbuminemia with malnutrition -Interventional radiology consult appreciated -Patient had ultrasound-guided paracentesis with removal of 3 L (11/15/2014) -Patient empirically started on IV Unasyn to cover for possible SBP (as patient has had klebseilla bacteremia in the 2015) -Fluid cytology pending -Fluid culture: no growth to date, no organisms seen -Fluid chemistry not consistent with SBP -SAAG unable to accurately calculate as patient's ascitic albumin was not a specific number, rather <1.   -Given her history of pancreatitic cancer, suspect carcinomatosis???- but cytology pending -Patient continues to have a firm abdomen with pain- US showed significant ascites on 11/16/2014 -Repeat US paracentesis on 11/17/2014 removed 3L- however patient's abdomen looks distended and firm -Spoke with daughter on 11/17/2014, regarding prognosis and Code status.  She would like to speak to her brother first.  She is amendable to palliative care consult. -  Palliative care consulted and appreciated -Spoke with radiology, Dr. Laurence Ferrari, who recommended tunneled cath is cytology shows malignancy or [place] shunt if not malignant  Previous Klebsiella and streptococcal bacteremia -ID consulted and appreciated -Patient currently on 33 or 42 day course of  antibiotics -ID recommended Cefuroxime 500mg  BID through March 8th when patient discharged- so she no longer needs PICC line -Thus far blood cultures and fluid cultures negative  Severe hypokalemia -Magnesium 1.9 -Continue to replace and monitor BMP  Dehydration -Secondary to poor oral intake and continue IV fluids  Portal vein thrombosis -Patient has had history of GI bleed is currently not anticoagulation candidate  Anemia secondary to chronic disease -Baseline approximately 8, currently 7.6 -Will transfuse if below 7  Hepatitis C -Currently not on treatment  Severe protein calorie malnutrition/failure to thrive -Nutrition consulted and appreciated -Patient started on clear liquid diet with nutritional supplementation  Pancreatic adenocarcinoma -S/p Whipple's procedure -Oncology consulted and appreciated  ?Small bowel ileus  Code Status: Full  Family Communication: None at bedside; Spoke with daughter, who is POA.   Disposition Plan: Admitted, continue to monitor, pending cytology.Patient will likely need repeat paracentesis.   Time Spent in minutes   30 minutes  Procedures  Ultrasound guided paracentesis 2/25 and 2/27  Consults   Interventional radiology Gastroenterology General surgery Oncology Infectious Disease Palliative care  DVT Prophylaxis  SCDs  Lab Results  Component Value Date   PLT 219 11/18/2014    Medications  Scheduled Meds: . ampicillin-sulbactam (UNASYN) IV  3 g Intravenous 4 times per day  . antiseptic oral rinse  7 mL Mouth Rinse q12n4p  . chlorhexidine  15 mL Mouth Rinse BID  . feeding supplement (RESOURCE BREEZE)  1 Container Oral TID BM  . pantoprazole (PROTONIX) IV  40 mg Intravenous Q24H  . potassium chloride  40 mEq Oral BID  . sodium chloride  3 mL Intravenous Q12H   Continuous Infusions: . sodium chloride 75 mL/hr at 11/17/14 2114   PRN Meds:.acetaminophen, albuterol, hydrALAZINE, morphine injection, ondansetron  (ZOFRAN) IV, sodium chloride  Antibiotics    Anti-infectives    Start     Dose/Rate Route Frequency Ordered Stop   11/15/14 0000  Ampicillin-Sulbactam (UNASYN) 3 g in sodium chloride 0.9 % 100 mL IVPB     3 g 100 mL/hr over 60 Minutes Intravenous 4 times per day 11/14/14 1616     11/14/14 1615  Ampicillin-Sulbactam (UNASYN) 3 g in sodium chloride 0.9 % 100 mL IVPB     3 g 100 mL/hr over 60 Minutes Intravenous STAT 11/14/14 1609 11/14/14 1827   11/14/14 1600  ampicillin-sulbactam (UNASYN) 1.5 g in sodium chloride 0.9 % 50 mL IVPB  Status:  Discontinued     1.5 g 100 mL/hr over 30 Minutes Intravenous  Once 11/14/14 1527 11/14/14 1609        Subjective:   Melinda Conrad seen and examined today.  Patient complains of "hard" abdomen.  She denies chest pain, shortness of breath, nausea or vomiting.    Objective:   Filed Vitals:   11/17/14 1651 11/17/14 2105 11/18/14 0500 11/18/14 0618  BP: 139/89 147/91  145/78  Pulse: 93 84  88  Temp: 98.5 F (36.9 C) 98.5 F (36.9 C)  98.6 F (37 C)  TempSrc: Oral Oral  Oral  Resp: 18 18  18   Height:      Weight:   61.2 kg (134 lb 14.7 oz)   SpO2: 100% 100%  100%    Wt Readings from  Last 3 Encounters:  11/18/14 61.2 kg (134 lb 14.7 oz)  10/29/14 50.395 kg (111 lb 1.6 oz)  10/17/14 45.6 kg (100 lb 8.5 oz)     Intake/Output Summary (Last 24 hours) at 11/18/14 1053 Last data filed at 11/18/14 0930  Gross per 24 hour  Intake 1176.25 ml  Output   1565 ml  Net -388.75 ml    Exam  General: Well developed, thin  Cardiovascular: S1 S2 auscultated,RRR  Respiratory: Diminished breath sounds  Abdomen: Firm, distended, diffusely tender, hypoactive bowel sounds  Extremities: warm dry without cyanosis clubbing. +LE edema B/L  Data Review   Micro Results Recent Results (from the past 240 hour(s))  Blood culture (routine x 2)     Status: None (Preliminary result)   Collection Time: 11/14/14 12:30 PM  Result Value Ref Range Status     Specimen Description BLOOD RIGHT HAND  Final   Special Requests BOTTLES DRAWN AEROBIC ONLY 4CC  Final   Culture   Final           BLOOD CULTURE RECEIVED NO GROWTH TO DATE CULTURE WILL BE HELD FOR 5 DAYS BEFORE ISSUING A FINAL NEGATIVE REPORT Performed at [facility]    Report Status PENDING  Incomplete  MRSA PCR Screening     Status: None   Collection Time: 11/14/14  5:15 PM  Result Value Ref Range Status   MRSA by PCR NEGATIVE NEGATIVE Final    Comment:        The GeneXpert MRSA Assay (FDA approved for NASAL specimens only), is one component of a comprehensive MRSA colonization surveillance program. It is not intended to diagnose MRSA infection nor to guide or monitor treatment for MRSA infections.   Body fluid culture     Status: None   Collection Time: 11/15/14 12:10 PM  Result Value Ref Range Status   Specimen Description ASCITIC  Final   Special Requests NONE  Final   Gram Stain   Final    RARE WBC PRESENT, PREDOMINANTLY PMN NO ORGANISMS SEEN Performed at [facility]    Culture   Final    NO GROWTH 3 DAYS Performed at [facility]    Report Status 11/18/2014 FINAL  Final  Anaerobic culture     Status: None (Preliminary result)   Collection Time: 11/15/14 12:10 PM  Result Value Ref Range Status   Specimen Description FLUID ASCITIES  Final   Special Requests NONE  Final   Gram Stain   Final    RARE WBC PRESENT, PREDOMINANTLY PMN NO ORGANISMS SEEN Performed at [facility]    Culture   Final    NO ANAEROBES ISOLATED; CULTURE IN PROGRESS FOR 5 DAYS Performed at [facility]    Report Status PENDING  Incomplete    Radiology Reports Dg Abd 1 View  11/16/2014   CLINICAL DATA:  Abdominal pain and distention.  EXAM: ABDOMEN - 1 VIEW  COMPARISON:  CT on 11/14/2014  FINDINGS: Chronic and bowel loops in the central abdomen is consistent with ascites. Surgical clips and piece of catheter or stent again seen in right upper  quadrant. Mild dilatation of small bowel loops and colon is seen in the central abdomen likely due to on adynamic ileus. Some bowel gas in contrast is seen in the rectum.  IMPRESSION: Ascites and probable adynamic ileus.   Electronically Signed   By: Earle Gell M.D.   On: 11/16/2014 16:06   Ct Head Wo Contrast  11/14/2014  CLINICAL DATA:  Altered mental status  EXAM: CT HEAD WITHOUT CONTRAST  TECHNIQUE: Contiguous axial images were obtained from the base of the skull through the vertex without intravenous contrast.  COMPARISON:  October 27, 2005  FINDINGS: The ventricles are normal in size and configuration. There is no mass, hemorrhage, extra-axial fluid collection, or midline shift. Gray-white compartments are normal. No acute infarct apparent. The bony calvarium appears intact. The mastoid air cells are clear.  IMPRESSION: No intracranial mass, hemorrhage, or focal gray -white compartment lesions/acute appearing infarct.   Electronically Signed   By: Lowella Grip III M.D.   On: 11/14/2014 14:17   US Abdomen Complete  11/14/2014   CLINICAL DATA:  Pancreatic cancer post Whipple procedure 08/07/2014, abdominal swelling for 1 week, prior cholecystectomy, hypertension, radiation therapy, smoker  EXAM: ULTRASOUND ABDOMEN COMPLETE  COMPARISON:  CT abdomen and pelvis 10/16/2014  FINDINGS: Gallbladder: Surgically absent  Common bile duct: Diameter: 5 mm diameter, normal  Liver: Heterogeneous in echogenicity. No discrete mass. Portal vein is not well demonstrated ; portal vein demonstrated thrombosis on the prior CT.  IVC: Intrahepatic portion normal appearance. Infrahepatic portion nonvisualized due to bowel gas.  Pancreas: Post Whipple procedure by history. Pancreatic bed inadequately visualized due to bowel gas.  Spleen: Normal appearance, 5.0 cm length  Right Kidney: Length: 9.3 cm. Normal morphology without mass or hydronephrosis.  Left Kidney: Length: 10.0 cm. Normal morphology without mass or  hydronephrosis.  Abdominal aorta: Proximally normal caliber, remainder obscured by bowel gas  Other findings: Significant ascites throughout abdomen and pelvis.  IMPRESSION: Significant ascites significantly increased since prior CT exam, likely cause of abdominal swelling.  Limited exam due to bowel gas, with inadequate visualization of the pancreatic bed, portal vein and portions of the aorta.  Post cholecystectomy.   Electronically Signed   By: Lavonia Dana M.D.   On: 11/14/2014 14:32   Ct Abdomen Pelvis W Contrast  11/14/2014   CLINICAL DATA:  64 year old female with abdominal pain and distension. Current history of pancreatic cancer status post Whipple and radiation. Subsequent encounter.  EXAM: CT ABDOMEN AND PELVIS WITH CONTRAST  TECHNIQUE: Multidetector CT imaging of the abdomen and pelvis was performed using the standard protocol following bolus administration of intravenous contrast.  CONTRAST:  140mL OMNIPAQUE IOHEXOL 300 MG/ML  SOLN  COMPARISON:  10/16/2014 and earlier.  FINDINGS: New moderate size bilateral pleural effusions with lower lobe atelectasis. No pericardial effusion.  Advanced degenerative changes in the lower lumbar spine. No acute osseous abnormality identified.  Increased body wall stranding and density in keeping with a degree of anasarca. Moderate to large volume of ascites in the abdomen has significantly progressed, and in the upper abdomen this is associated with new lobular mass effect on the liver contour (series 2, image 17).  No dilated large bowel. Widespread small and large bowel wall thickening. Pancreatic or biliary stent re- identified within a small bowel loop in the right upper quadrant which today is dilated with fluid up to 4 cm diameter. There is continued intra and extrahepatic biliary ductal dilatation. Pancreatic tail atrophy again noted.  In January of the main portal vein was thrombosed. On today images no portal venous system enhancement is identified.  The  stomach is thick walled. Oral contrast was administered, and has reached mid small bowel loops.  Major arterial structures in the abdomen and pelvis appear patent. Aortoiliac calcified atherosclerosis noted. Renal enhancement and contrast excretion is within normal limits.  IMPRESSION: 1. Progression of disease. Progressed and  now diffuse portal venous system thrombosis since January. 2. Anasarca. Large volume ascites, mass effect on the liver may reflect subcapsular or loculated ascites. Diffuse moderate to severe bowel wall thickening. Moderate bilateral pleural effusions. 3. Fluid-filled and dilated small bowel loop draining the pancreas.   Electronically Signed   By: Genevie Ann M.D.   On: 11/14/2014 14:42   US Abdomen Limited  11/16/2014   CLINICAL DATA:  Abdominal distention question ascites, history Whipple procedure, hypertension, pancreatic cancer  EXAM: LIMITED ABDOMEN ULTRASOUND FOR ASCITES  TECHNIQUE: Limited ultrasound survey for ascites was performed in all four abdominal quadrants.  COMPARISON:  11/14/2014 CT abdomen and pelvis  FINDINGS: Significant ascites is identified in both lower quadrants.  Smaller amount of ascites is seen in the LEFT upper quadrant, with even less in the perihepatic region.  IMPRESSION: Significant ascites.   Electronically Signed   By: Lavonia Dana M.D.   On: 11/16/2014 17:34   US Paracentesis  11/17/2014   INDICATION: ascites  CLINICAL DATA:  Request for therapeutic paracentesis  EXAM: ULTRASOUND-GUIDED PARACENTESIS  ULTRASOUND GUIDED RLQ PARACENTESIS  COMPARISON:  None.  MEDICATIONS: 10 cc 1% lidocaine  COMPLICATIONS: None immediate  TECHNIQUE: Informed written consent was obtained from the patient after a discussion of the risks, benefits and alternatives to treatment. A timeout was performed prior to the initiation of the procedure.  Initial ultrasound scanning demonstrates a large amount of ascites within the right lower abdominal quadrant. The right lower abdomen was  prepped and draped in the usual sterile fashion. 1% lidocaine with epinephrine was used for local anesthesia. Under direct ultrasound guidance, a 19 gauge, 7-cm, Yueh catheter was introduced. An ultrasound image was saved for documentation purposed. the paracentesis was performed. The catheter was removed and a dressing was applied. The patient tolerated the procedure well without immediate post procedural complication.  FINDINGS: A total of approximately 3 liters of serous fluid was removed.  IMPRESSION: Successful ultrasound-guided paracentesis yielding 3 liters of peritoneal fluid.  PROCEDURE: Ultrasound was performed to localize and mark an adequate pocket of fluid in the right quadrant of the abdomen. The area was then prepped and draped in the normal sterile fashion. 1% Lidocaine was used for local anesthesia. Under ultrasound guidance a 19 gauge Yueh catheter was introduced. Paracentesis was performed. The catheter was removed and a dressing applied.  Read by:  Lavonia Drafts [facility]   Electronically Signed   By: Jacqulynn Cadet M.D.   On: 11/17/2014 11:31   US Paracentesis  11/15/2014   CLINICAL DATA:  Ascites, history of pancreatic cancer. Request therapeutic and diagnostic paracentesis of up to 3 L max.  EXAM: ULTRASOUND GUIDED PARACENTESIS  COMPARISON:  None.  PROCEDURE: An ultrasound guided paracentesis was thoroughly discussed with the patient and questions answered. The benefits, risks, alternatives and complications were also discussed. The patient understands and wishes to proceed with the procedure. Written consent was obtained.  Ultrasound was performed to localize and mark an adequate pocket of fluid in the left lower quadrant of the abdomen. The area was then prepped and draped in the normal sterile fashion. 1% Lidocaine was used for local anesthesia. Under ultrasound guidance a 19 gauge Yueh catheter was introduced. Paracentesis was performed. The catheter was removed and a dressing applied.   COMPLICATIONS: None immediate  FINDINGS: A total of approximately 3 L of clear yellow fluid was removed. A fluid sample was sent for laboratory analysis.  IMPRESSION: Successful ultrasound guided paracentesis yielding 3 L of ascites.  Read by: Ascencion Dike PA-C   Electronically Signed   By: Corrie Mckusick D.O.   On: 11/15/2014 12:55    CBC  Recent Labs Lab 11/14/14 1154 11/15/14 0857 11/16/14 0425 11/17/14 0505 11/18/14 0545  WBC 10.5 8.6 5.8 4.5 5.7  HGB 8.5* 8.0* 7.9* 7.9* 7.6*  HCT 26.0* 23.8* 23.7* 23.7* 23.2*  PLT 279 273 226 242 219  MCV 85.5 82.9 84.9 85.3 85.9  MCH 28.0 27.9 28.3 28.4 28.1  MCHC 32.7 33.6 33.3 33.3 32.8  RDW 20.8* 21.6* 21.9* 22.9* 23.9*    Chemistries   Recent Labs Lab 11/14/14 1154 11/15/14 0200 11/16/14 0425 11/17/14 0505 11/18/14 0545  NA 139 137 141 136 137  K <2.0* 3.1* 3.1* 3.6 3.1*  CL 108 115* 116* 114* 113*  CO2 21 16* 21 20 20   GLUCOSE 101* 73 80 110* 93  BUN 12 11 10 9 7   CREATININE 0.54 0.49* 0.44* 0.49* 0.33*  CALCIUM 7.7* 7.1* 7.3* 7.2* 7.5*  MG 1.9  --   --   --   --   AST 128* 126* 127* 117* 100*  ALT 53* 53* 55* 56* 50*  ALKPHOS 1216* 1035* 969* 965* 905*  BILITOT 4.9* 5.1* 5.4* 6.1* 6.3*   ------------------------------------------------------------------------------------------------------------------ estimated creatinine clearance is 61.1 mL/min (by C-G formula based on Cr of 0.33). ------------------------------------------------------------------------------------------------------------------ No results for input(s): HGBA1C in the last 72 hours. ------------------------------------------------------------------------------------------------------------------ No results for input(s): CHOL, HDL, LDLCALC, TRIG, CHOLHDL, LDLDIRECT in the last 72 hours. ------------------------------------------------------------------------------------------------------------------ No results for input(s): TSH, T4TOTAL, T3FREE, THYROIDAB  in the last 72 hours.  Invalid input(s): FREET3 ------------------------------------------------------------------------------------------------------------------ No results for input(s): VITAMINB12, FOLATE, FERRITIN, TIBC, IRON, RETICCTPCT in the last 72 hours.  Coagulation profile  Recent Labs Lab 11/14/14 1153  INR 1.60*    No results for input(s): DDIMER in the last 72 hours.  Cardiac Enzymes No results for input(s): CKMB, TROPONINI, MYOGLOBIN in the last 168 hours.  Invalid input(s): CK ------------------------------------------------------------------------------------------------------------------ Invalid input(s): POCBNP    Atira Borello D.O. on 11/18/2014 at 10:53 AM  Between 7am to 7pm - Pager - [phone]  After 7pm go to [email or web address] - password [email or web address]  And look for the night coverage person covering for me after hours  [facility] Office  [phone]

## 2014-11-19 LAB — COMPREHENSIVE METABOLIC PANEL
ALT: 37 U/L — ABNORMAL HIGH (ref 0–35)
ANION GAP: 4 — AB (ref 5–15)
AST: 70 U/L — ABNORMAL HIGH (ref 0–37)
Albumin: 2 g/dL — ABNORMAL LOW (ref 3.5–5.2)
Alkaline Phosphatase: 671 U/L — ABNORMAL HIGH (ref 39–117)
BUN: 6 mg/dL (ref 6–23)
CALCIUM: 7.8 mg/dL — AB (ref 8.4–10.5)
CO2: 20 mmol/L (ref 19–32)
Chloride: 114 mmol/L — ABNORMAL HIGH (ref 96–112)
Creatinine, Ser: <0.3 mg/dL — ABNORMAL LOW (ref 0.50–1.10)
Glucose, Bld: 93 mg/dL (ref 70–99)
Potassium: 3.3 mmol/L — ABNORMAL LOW (ref 3.5–5.1)
SODIUM: 138 mmol/L (ref 135–145)
TOTAL PROTEIN: 6.5 g/dL (ref 6.0–8.3)
Total Bilirubin: 8.4 mg/dL — ABNORMAL HIGH (ref 0.3–1.2)

## 2014-11-19 LAB — CBC
HEMATOCRIT: 21.8 % — AB (ref 36.0–46.0)
HEMOGLOBIN: 7 g/dL — AB (ref 12.0–15.0)
MCH: 27.8 pg (ref 26.0–34.0)
MCHC: 32.1 g/dL (ref 30.0–36.0)
MCV: 86.5 fL (ref 78.0–100.0)
Platelets: 192 10*3/uL (ref 150–400)
RBC: 2.52 MIL/uL — ABNORMAL LOW (ref 3.87–5.11)
RDW: 24.3 % — ABNORMAL HIGH (ref 11.5–15.5)
WBC: 4.1 10*3/uL (ref 4.0–10.5)

## 2014-11-19 LAB — AMMONIA: AMMONIA: 95 umol/L — AB (ref 11–32)

## 2014-11-19 LAB — PREPARE RBC (CROSSMATCH)

## 2014-11-19 MED ORDER — LACTULOSE ENEMA
300.0000 mL | Freq: Once | ORAL | Status: DC
  Filled 2014-11-19: qty 300

## 2014-11-19 MED ORDER — SODIUM CHLORIDE 0.9 % IV SOLN
Freq: Once | INTRAVENOUS | Status: AC
Start: 2014-11-19 — End: 2014-11-19
  Administered 2014-11-19: 10:00:00 via INTRAVENOUS

## 2014-11-19 NOTE — Progress Notes (Signed)
Patient ID: Melinda Conrad, female   DOB: 03-11-1951, 64 y.o.   MRN: [id number]    Progress Note   Subjective  C/o ongoing abdominal pain, not able to eat much. No better- no change in pain post 3 liter paracentesis   Objective   Vital signs in last 24 hours: Temp:  [98.3 F (36.8 C)-99.4 F (37.4 C)] 98.9 F (37.2 C) (02/29 1045) Pulse Rate:  [82-92] 82 (02/29 1045) Resp:  [16] 16 (02/29 1045) BP: (106-145)/(70-90) 129/84 mmHg (02/29 1045) SpO2:  [99 %-100 %] 99 % (02/29 1045) Weight:  [135 lb 5.8 oz (61.4 kg)] 135 lb 5.8 oz (61.4 kg) (02/29 0400) Last BM Date: 11/19/14 General:    Emaciated AA  female in NAD ill appearing Heart:  Regular rate and rhythm; no murmurs Lungs: Respirations even and unlabored, lungs CTA bilaterally Abdomen : full ,firm ,rather diffusely tender+ ascites  Extremities:  Without edema. Neurologic:  Alert slow response time, no asterixis Psych:  Cooperative. Normal mood and affect.  Intake/Output from previous day: 02/28 0701 - 02/29 0700 In: 3983.8 [P.O.:1200; I.V.:2683.8; IV Piggyback:100] Out: 1025 [Urine:1025] Intake/Output this shift: Total I/O In: 120 [P.O.:120] Out: -   Lab Results:  Recent Labs  11/17/14 0505 11/18/14 0545 11/19/14 0520  WBC 4.5 5.7 4.1  HGB 7.9* 7.6* 7.0*  HCT 23.7* 23.2* 21.8*  PLT 242 219 192   BMET  Recent Labs  11/17/14 0505 11/18/14 0545 11/19/14 0520  NA 136 137 138  K 3.6 3.1* 3.3*  CL 114* 113* 114*  CO2 20 20 20   GLUCOSE 110* 93 93  BUN 9 7 6   CREATININE 0.49* 0.33* <0.30*  CALCIUM 7.2* 7.5* 7.8*   LFT  Recent Labs  11/19/14 0520  PROT 6.5  ALBUMIN 2.0*  AST 70*  ALT 37*  ALKPHOS 671*  BILITOT 8.4*   PT/INR No results for input(s): LABPROT, INR in the last 72 hours.  Studies/Results: No results found.     Assessment / Plan:   #1 64 yo female with pancreatic adenocarcinoma s/p whipple 07/2014 . 1/6 nodes Positive at time of surgery . Pt has had a difficult time post op  with hospitalizations secondary to Gi bleeding from anastomotic friability, PV thrombosis, and now refractory ascites and encephalopathy. Cytology pending from paracentesis on 2/26 . 3 liters removed, and she is not sure she feels any better post decompression. Palliative care is seeing today, pt lives alone  ? Candidate for [facility].  #2 anemia- transfusing today #3Bacteremia-klebsiella-continues on Unasyn  Principal Problem:   Hepatic encephalopathy Active Problems:   Pancreatic cancer   Protein-calorie malnutrition, severe   Portal vein thrombosis   Hypokalemia   Anemia in neoplastic disease   Recurrent bacteremia   Hyperammonemia   Hepatitis C   Ascites/anasarca   Dehydration   Failure to thrive in adult   Abdominal pain, acute     LOS: 5 days   Amy Esterwood  11/19/2014, 11:10 AM   GI Attending Note  I have personally taken an interval history, reviewed the chart, and examined the patient.  I agree with the extender's note, impression and recommendations.  Main problem appears to be ascites.  Malignant ascites is unlikely.  She may benefit from placement of a peritoneal vascular shunt.  Will discuss with oncology.  Sandy Salaam. Deatra Ina, MD, [facility] [phone]

## 2014-11-19 NOTE — Progress Notes (Signed)
This NP spoke to patient's daughter and awaits call back for meeting time for family to meet  in the am.  Wadie Lessen NP

## 2014-11-19 NOTE — Progress Notes (Signed)
MD aware of patient output of 75 from foley catheter in last 8 hours.  Patient currently on fluids with a diet.  No orders att his time

## 2014-11-19 NOTE — Progress Notes (Signed)
[facility]  Patient Status: Active (receiving services up to time of hospitalization)  [facility] is providing the following services: RN  If patient discharges after hours, please call [phone].   Lurlean Leyden 11/19/2014, 9:34 AM

## 2014-11-19 NOTE — Progress Notes (Signed)
PT Cancellation Note  Patient Details Name: Melinda Conrad MRN: [id number] DOB: 10-30-1950   Cancelled Treatment:    Reason Eval/Treat Not Completed: Medical issues which prohibited therapy--attempted PT tx session. Pt declined participation due to not feeling well. Will check back as schedule permits.    Weston Anna, MPT Pager: [phone]

## 2014-11-19 NOTE — Progress Notes (Signed)
PT Cancellation Note  Patient Details Name: Melinda Conrad MRN: [id number] DOB: December 25, 1950   Cancelled Treatment:    Reason Eval/Treat Not Completed: 2nd attempt-pt declined due to still not feeling well. Will check back another day.    Weston Anna, MPT Pager: [phone]

## 2014-11-19 NOTE — Progress Notes (Signed)
[facility]                                                                              Patient Demographics  Melinda Conrad, is a 64 y.o. female, DOB - 1950/11/12, OAC:[id number]  Admit date - 11/14/2014   Admitting Physician Modena Jansky, MD  Outpatient Primary MD for the patient is Marlou Sa, ERIC, MD  LOS - 5   Chief Complaint  Patient presents with  . Ascites  . Weakness  . Altered Mental Status      HPI on 11/14/2014 by Dr. [name] IRIANNA Conrad is a 64 y.o. female with history of adenocarcinoma of head of pancreas, status post Whipple's procedure 08/07/14, completed concurrent capecitabine and radiation, poor performance status and hence did not get adjuvant chemotherapy, recurrent Klebsiella bacteremia- attributed to intra-abdominal source/presumed septic portal vein thrombus, acute portal vein thrombosis-not on anticoagulation secondary to GI bleed, tobacco abuse & hepatitis C presented to the [facility] ED on 11/14/14 with complaints of progressive mental status changes, abdominal distention/pain and poor appetite. Patient unable to provide history secondary to AMS. No family at bedside currently. History obtained from EDP who discussed with family. Patient apparently has been having progressive failure to thrive since Whipple's procedure. However over the last 3 days, family have noticed gradually worsening confusion, disorientation and lethargic to a point of near unresponsiveness today, decreased oral intake of liquids and food but no nausea or vomiting, progressive abdominal and lower extremity swelling with intermittent abdominal pain and no fevers. Patient has been getting IV antibiotics through right upper extremity PICC line and therefore plans to remove PICC line and from IV Unasyn to oral cefuroxime. In the ED, patient is somnolent but arousable, afebrile, elevated blood pressure, lab work significant for potassium <2, abnormal LFTs, ammonia 120,  hemoglobin 8.5, CT head without acute findings, CT abdomen and pelvis: Diffuse portal vein system thrombosis, anasarca, diffuse moderate to severe bowel wall thickening, moderate bilateral pleural effusions and fluid filled and dilated small bowel loop draining the pancreas. Hospitalist admission was requested.  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/hepatitis C -Improved -CT of the head: No acute findings -Blood cultures: no growth to date -Gastroenterology consulted and appreciated -Ammonia level upon admission 120, now 72 -Continue lactulose enema  Anasarca/ascites -Secondary to liver disease, portal vein thrombosis and hypoalbuminemia with malnutrition -Interventional radiology consult appreciated -Patient had ultrasound-guided paracentesis with removal of 3 L (11/15/2014) -Patient empirically started on IV Unasyn to cover for possible SBP (as patient has had klebseilla bacteremia in the 2015) -Spoke with pathology: Fluid cytology shows atypical cells -Fluid culture: no growth to date, no organisms seen -Fluid chemistry not consistent with SBP -SAAG unable to accurately calculate as patient's ascitic albumin was not a specific number, rather <1.   -Given her history of pancreatitic cancer, suspect carcinomatosis???- but cytology pending -Patient continues to have a firm abdomen with pain- US showed significant ascites on 11/16/2014 -Repeat US paracentesis on 11/17/2014 removed 3L- however patient's abdomen looks distended and firm -Spoke with daughter on 11/17/2014, regarding prognosis and Code status.  She would like to speak to her brother first.  She is  amendable to palliative care consult. -Palliative care consulted and appreciated -Spoke with radiology, Dr. Laurence Ferrari, who recommended tunneled cath is cytology shows malignancy or [place] shunt if not malignant -Planned for 3rd paracentesis today -Spoke with patient and her daughter about outlook and  prognosis.  They would like to have Dr. Barry Dienes (general surgery) weigh in.   -Patient also given albumin on 2/28.    Previous Klebsiella and streptococcal bacteremia -ID consulted and appreciated -Patient needs to complete 42 day course of antibiotics -ID recommended Cefuroxime 500mg  BID through March 8th when patient discharged- so she no longer needs PICC line -Thus far blood cultures and fluid cultures negative  Severe hypokalemia -Magnesium 1.9 -Continue to replace and monitor BMP  Dehydration -Secondary to poor oral intake and continue IV fluids  Portal vein thrombosis -Patient has had history of GI bleed is currently not anticoagulation candidate  Anemia secondary to chronic disease -Baseline approximately 8, currently 7.0 -Will transfuse 1uPRBCs  Hepatitis C -Currently not on treatment  Severe protein calorie malnutrition/failure to thrive -Nutrition consulted and appreciated -Patient started on clear liquid diet with nutritional supplementation  Pancreatic adenocarcinoma -S/p Whipple's procedure -Oncology consulted and appreciated  ?Small bowel ileus  Code Status: Full  Family Communication: None at bedside; Spoke with daughter, who is POA.   Disposition Plan: Admitted- Paracentesis and blood transfusion today  Time Spent in minutes   30 minutes  Procedures  Ultrasound guided paracentesis 2/25 and 2/27  Consults   Interventional radiology Gastroenterology General surgery Oncology Infectious Disease Palliative care  DVT Prophylaxis  SCDs  Lab Results  Component Value Date   PLT 192 11/19/2014    Medications  Scheduled Meds: . ampicillin-sulbactam (UNASYN) IV  3 g Intravenous 4 times per day  . antiseptic oral rinse  7 mL Mouth Rinse q12n4p  . chlorhexidine  15 mL Mouth Rinse BID  . feeding supplement (RESOURCE BREEZE)  1 Container Oral TID BM  . lactulose  300 mL Rectal Once  . pantoprazole (PROTONIX) IV  40 mg Intravenous Q24H  .  potassium chloride  40 mEq Oral BID  . sodium chloride  3 mL Intravenous Q12H   Continuous Infusions: . sodium chloride 75 mL/hr at 11/19/14 0428   PRN Meds:.acetaminophen, albuterol, hydrALAZINE, morphine injection, ondansetron (ZOFRAN) IV, sodium chloride  Antibiotics    Anti-infectives    Start     Dose/Rate Route Frequency Ordered Stop   11/15/14 0000  Ampicillin-Sulbactam (UNASYN) 3 g in sodium chloride 0.9 % 100 mL IVPB     3 g 100 mL/hr over 60 Minutes Intravenous 4 times per day 11/14/14 1616     11/14/14 1615  Ampicillin-Sulbactam (UNASYN) 3 g in sodium chloride 0.9 % 100 mL IVPB     3 g 100 mL/hr over 60 Minutes Intravenous STAT 11/14/14 1609 11/14/14 1827   11/14/14 1600  ampicillin-sulbactam (UNASYN) 1.5 g in sodium chloride 0.9 % 50 mL IVPB  Status:  Discontinued     1.5 g 100 mL/hr over 30 Minutes Intravenous  Once 11/14/14 1527 11/14/14 1609        Subjective:   [name] seen and examined today.  Patient complains of pain in her stomach.  She did not want a paracentesis yesterday but will go through with it today.  Patient stated that "this is not cancer, Dr. Benay Spice told me that."  Objective:   Filed Vitals:   11/19/14 0900 11/19/14 1003 11/19/14 1030 11/19/14 1045  BP: 132/90 122/82 129/81 129/84  Pulse: 84 86  85 82  Temp: 99.4 F (37.4 C) 98.6 F (37 C) 98.8 F (37.1 C) 98.9 F (37.2 C)  TempSrc: Oral Oral Oral Oral  Resp: 16 16 16 16   Height:      Weight:      SpO2: 99% 99% 99% 99%    Wt Readings from Last 3 Encounters:  11/19/14 61.4 kg (135 lb 5.8 oz)  10/29/14 50.395 kg (111 lb 1.6 oz)  10/17/14 45.6 kg (100 lb 8.5 oz)     Intake/Output Summary (Last 24 hours) at 11/19/14 1153 Last data filed at 11/19/14 6333  Gross per 24 hour  Intake 3863.75 ml  Output    725 ml  Net 3138.75 ml    Exam  General: Well developed, thin  Cardiovascular: S1 S2 auscultated,RRR  Respiratory: Diminished breath sounds  Abdomen: Firm,  distended, diffusely tender, hypoactive bowel sounds  Neuro: AAO X3.  Nonfocal  Data Review   Micro Results Recent Results (from the past 240 hour(s))  Blood culture (routine x 2)     Status: None (Preliminary result)   Collection Time: 11/14/14 12:30 PM  Result Value Ref Range Status   Specimen Description BLOOD RIGHT HAND  Final   Special Requests BOTTLES DRAWN AEROBIC ONLY 4CC  Final   Culture   Final           BLOOD CULTURE RECEIVED NO GROWTH TO DATE CULTURE WILL BE HELD FOR 5 DAYS BEFORE ISSUING A FINAL NEGATIVE REPORT Performed at [facility]    Report Status PENDING  Incomplete  MRSA PCR Screening     Status: None   Collection Time: 11/14/14  5:15 PM  Result Value Ref Range Status   MRSA by PCR NEGATIVE NEGATIVE Final    Comment:        The GeneXpert MRSA Assay (FDA approved for NASAL specimens only), is one component of a comprehensive MRSA colonization surveillance program. It is not intended to diagnose MRSA infection nor to guide or monitor treatment for MRSA infections.   Body fluid culture     Status: None   Collection Time: 11/15/14 12:10 PM  Result Value Ref Range Status   Specimen Description ASCITIC  Final   Special Requests NONE  Final   Gram Stain   Final    RARE WBC PRESENT, PREDOMINANTLY PMN NO ORGANISMS SEEN Performed at [facility]    Culture   Final    NO GROWTH 3 DAYS Performed at [facility]    Report Status 11/18/2014 FINAL  Final  Anaerobic culture     Status: None (Preliminary result)   Collection Time: 11/15/14 12:10 PM  Result Value Ref Range Status   Specimen Description FLUID ASCITIES  Final   Special Requests NONE  Final   Gram Stain   Final    RARE WBC PRESENT, PREDOMINANTLY PMN NO ORGANISMS SEEN Performed at [facility]    Culture   Final    NO ANAEROBES ISOLATED; CULTURE IN PROGRESS FOR 5 DAYS Performed at [facility]    Report Status PENDING  Incomplete    Radiology  Reports Dg Abd 1 View  11/16/2014   CLINICAL DATA:  Abdominal pain and distention.  EXAM: ABDOMEN - 1 VIEW  COMPARISON:  CT on 11/14/2014  FINDINGS: Chronic and bowel loops in the central abdomen is consistent with ascites. Surgical clips and piece of catheter or stent again seen in right upper quadrant. Mild dilatation of small bowel loops and colon is seen  in the central abdomen likely due to on adynamic ileus. Some bowel gas in contrast is seen in the rectum.  IMPRESSION: Ascites and probable adynamic ileus.   Electronically Signed   By: Earle Gell M.D.   On: 11/16/2014 16:06   Ct Head Wo Contrast  11/14/2014   CLINICAL DATA:  Altered mental status  EXAM: CT HEAD WITHOUT CONTRAST  TECHNIQUE: Contiguous axial images were obtained from the base of the skull through the vertex without intravenous contrast.  COMPARISON:  October 27, 2005  FINDINGS: The ventricles are normal in size and configuration. There is no mass, hemorrhage, extra-axial fluid collection, or midline shift. Gray-white compartments are normal. No acute infarct apparent. The bony calvarium appears intact. The mastoid air cells are clear.  IMPRESSION: No intracranial mass, hemorrhage, or focal gray -white compartment lesions/acute appearing infarct.   Electronically Signed   By: Lowella Grip III M.D.   On: 11/14/2014 14:17   US Abdomen Complete  11/14/2014   CLINICAL DATA:  Pancreatic cancer post Whipple procedure 08/07/2014, abdominal swelling for 1 week, prior cholecystectomy, hypertension, radiation therapy, smoker  EXAM: ULTRASOUND ABDOMEN COMPLETE  COMPARISON:  CT abdomen and pelvis 10/16/2014  FINDINGS: Gallbladder: Surgically absent  Common bile duct: Diameter: 5 mm diameter, normal  Liver: Heterogeneous in echogenicity. No discrete mass. Portal vein is not well demonstrated ; portal vein demonstrated thrombosis on the prior CT.  IVC: Intrahepatic portion normal appearance. Infrahepatic portion nonvisualized due to bowel gas.   Pancreas: Post Whipple procedure by history. Pancreatic bed inadequately visualized due to bowel gas.  Spleen: Normal appearance, 5.0 cm length  Right Kidney: Length: 9.3 cm. Normal morphology without mass or hydronephrosis.  Left Kidney: Length: 10.0 cm. Normal morphology without mass or hydronephrosis.  Abdominal aorta: Proximally normal caliber, remainder obscured by bowel gas  Other findings: Significant ascites throughout abdomen and pelvis.  IMPRESSION: Significant ascites significantly increased since prior CT exam, likely cause of abdominal swelling.  Limited exam due to bowel gas, with inadequate visualization of the pancreatic bed, portal vein and portions of the aorta.  Post cholecystectomy.   Electronically Signed   By: Lavonia Dana M.D.   On: 11/14/2014 14:32   Ct Abdomen Pelvis W Contrast  11/14/2014   CLINICAL DATA:  64 year old female with abdominal pain and distension. Current history of pancreatic cancer status post Whipple and radiation. Subsequent encounter.  EXAM: CT ABDOMEN AND PELVIS WITH CONTRAST  TECHNIQUE: Multidetector CT imaging of the abdomen and pelvis was performed using the standard protocol following bolus administration of intravenous contrast.  CONTRAST:  174mL OMNIPAQUE IOHEXOL 300 MG/ML  SOLN  COMPARISON:  10/16/2014 and earlier.  FINDINGS: New moderate size bilateral pleural effusions with lower lobe atelectasis. No pericardial effusion.  Advanced degenerative changes in the lower lumbar spine. No acute osseous abnormality identified.  Increased body wall stranding and density in keeping with a degree of anasarca. Moderate to large volume of ascites in the abdomen has significantly progressed, and in the upper abdomen this is associated with new lobular mass effect on the liver contour (series 2, image 17).  No dilated large bowel. Widespread small and large bowel wall thickening. Pancreatic or biliary stent re- identified within a small bowel loop in the right upper quadrant  which today is dilated with fluid up to 4 cm diameter. There is continued intra and extrahepatic biliary ductal dilatation. Pancreatic tail atrophy again noted.  In January of the main portal vein was thrombosed. On today images no portal venous  system enhancement is identified.  The stomach is thick walled. Oral contrast was administered, and has reached mid small bowel loops.  Major arterial structures in the abdomen and pelvis appear patent. Aortoiliac calcified atherosclerosis noted. Renal enhancement and contrast excretion is within normal limits.  IMPRESSION: 1. Progression of disease. Progressed and now diffuse portal venous system thrombosis since January. 2. Anasarca. Large volume ascites, mass effect on the liver may reflect subcapsular or loculated ascites. Diffuse moderate to severe bowel wall thickening. Moderate bilateral pleural effusions. 3. Fluid-filled and dilated small bowel loop draining the pancreas.   Electronically Signed   By: Genevie Ann M.D.   On: 11/14/2014 14:42   US Abdomen Limited  11/16/2014   CLINICAL DATA:  Abdominal distention question ascites, history Whipple procedure, hypertension, pancreatic cancer  EXAM: LIMITED ABDOMEN ULTRASOUND FOR ASCITES  TECHNIQUE: Limited ultrasound survey for ascites was performed in all four abdominal quadrants.  COMPARISON:  11/14/2014 CT abdomen and pelvis  FINDINGS: Significant ascites is identified in both lower quadrants.  Smaller amount of ascites is seen in the LEFT upper quadrant, with even less in the perihepatic region.  IMPRESSION: Significant ascites.   Electronically Signed   By: Lavonia Dana M.D.   On: 11/16/2014 17:34   US Paracentesis  11/17/2014   INDICATION: ascites  CLINICAL DATA:  Request for therapeutic paracentesis  EXAM: ULTRASOUND-GUIDED PARACENTESIS  ULTRASOUND GUIDED RLQ PARACENTESIS  COMPARISON:  None.  MEDICATIONS: 10 cc 1% lidocaine  COMPLICATIONS: None immediate  TECHNIQUE: Informed written consent was obtained from the  patient after a discussion of the risks, benefits and alternatives to treatment. A timeout was performed prior to the initiation of the procedure.  Initial ultrasound scanning demonstrates a large amount of ascites within the right lower abdominal quadrant. The right lower abdomen was prepped and draped in the usual sterile fashion. 1% lidocaine with epinephrine was used for local anesthesia. Under direct ultrasound guidance, a 19 gauge, 7-cm, Yueh catheter was introduced. An ultrasound image was saved for documentation purposed. the paracentesis was performed. The catheter was removed and a dressing was applied. The patient tolerated the procedure well without immediate post procedural complication.  FINDINGS: A total of approximately 3 liters of serous fluid was removed.  IMPRESSION: Successful ultrasound-guided paracentesis yielding 3 liters of peritoneal fluid.  PROCEDURE: Ultrasound was performed to localize and mark an adequate pocket of fluid in the right quadrant of the abdomen. The area was then prepped and draped in the normal sterile fashion. 1% Lidocaine was used for local anesthesia. Under ultrasound guidance a 19 gauge Yueh catheter was introduced. Paracentesis was performed. The catheter was removed and a dressing applied.  Read by:  Lavonia Drafts [facility]   Electronically Signed   By: Jacqulynn Cadet M.D.   On: 11/17/2014 11:31   US Paracentesis  11/15/2014   CLINICAL DATA:  Ascites, history of pancreatic cancer. Request therapeutic and diagnostic paracentesis of up to 3 L max.  EXAM: ULTRASOUND GUIDED PARACENTESIS  COMPARISON:  None.  PROCEDURE: An ultrasound guided paracentesis was thoroughly discussed with the patient and questions answered. The benefits, risks, alternatives and complications were also discussed. The patient understands and wishes to proceed with the procedure. Written consent was obtained.  Ultrasound was performed to localize and mark an adequate pocket of fluid in the left  lower quadrant of the abdomen. The area was then prepped and draped in the normal sterile fashion. 1% Lidocaine was used for local anesthesia. Under ultrasound guidance a 19 gauge  Yueh catheter was introduced. Paracentesis was performed. The catheter was removed and a dressing applied.  COMPLICATIONS: None immediate  FINDINGS: A total of approximately 3 L of clear yellow fluid was removed. A fluid sample was sent for laboratory analysis.  IMPRESSION: Successful ultrasound guided paracentesis yielding 3 L of ascites.  Read by: Ascencion Dike PA-C   Electronically Signed   By: Corrie Mckusick D.O.   On: 11/15/2014 12:55    CBC  Recent Labs Lab 11/15/14 0857 11/16/14 0425 11/17/14 0505 11/18/14 0545 11/19/14 0520  WBC 8.6 5.8 4.5 5.7 4.1  HGB 8.0* 7.9* 7.9* 7.6* 7.0*  HCT 23.8* 23.7* 23.7* 23.2* 21.8*  PLT 273 226 242 219 192  MCV 82.9 84.9 85.3 85.9 86.5  MCH 27.9 28.3 28.4 28.1 27.8  MCHC 33.6 33.3 33.3 32.8 32.1  RDW 21.6* 21.9* 22.9* 23.9* 24.3*    Chemistries   Recent Labs Lab 11/14/14 1154 11/15/14 0200 11/16/14 0425 11/17/14 0505 11/18/14 0545 11/19/14 0520  NA 139 137 141 136 137 138  K <2.0* 3.1* 3.1* 3.6 3.1* 3.3*  CL 108 115* 116* 114* 113* 114*  CO2 21 16* 21 20 20 20   GLUCOSE 101* 73 80 110* 93 93  BUN 12 11 10 9 7 6   CREATININE 0.54 0.49* 0.44* 0.49* 0.33* <0.30*  CALCIUM 7.7* 7.1* 7.3* 7.2* 7.5* 7.8*  MG 1.9  --   --   --   --   --   AST 128* 126* 127* 117* 100* 70*  ALT 53* 53* 55* 56* 50* 37*  ALKPHOS 1216* 1035* 969* 965* 905* 671*  BILITOT 4.9* 5.1* 5.4* 6.1* 6.3* 8.4*   ------------------------------------------------------------------------------------------------------------------ CrCl cannot be calculated (Patient has no serum creatinine result on file.). ------------------------------------------------------------------------------------------------------------------ No results for input(s): HGBA1C in the last 72  hours. ------------------------------------------------------------------------------------------------------------------ No results for input(s): CHOL, HDL, LDLCALC, TRIG, CHOLHDL, LDLDIRECT in the last 72 hours. ------------------------------------------------------------------------------------------------------------------ No results for input(s): TSH, T4TOTAL, T3FREE, THYROIDAB in the last 72 hours.  Invalid input(s): FREET3 ------------------------------------------------------------------------------------------------------------------ No results for input(s): VITAMINB12, FOLATE, FERRITIN, TIBC, IRON, RETICCTPCT in the last 72 hours.  Coagulation profile  Recent Labs Lab 11/14/14 1153  INR 1.60*    No results for input(s): DDIMER in the last 72 hours.  Cardiac Enzymes No results for input(s): CKMB, TROPONINI, MYOGLOBIN in the last 168 hours.  Invalid input(s): CK ------------------------------------------------------------------------------------------------------------------ Invalid input(s): POCBNP    Jowanda Heeg D.O. on 11/19/2014 at 11:53 AM  Between 7am to 7pm - Pager - [phone]  After 7pm go to [email or web address] - password [email or web address]  And look for the night coverage person covering for me after hours  [facility] Office  [phone]

## 2014-11-19 NOTE — Consult Note (Signed)
Patient XI:PJASN J Riner      DOB: 1951-02-16      KNL:[id number]     Consult Note from the Palliative Medicine Team at [facility] Requested by: Dr Ree Kida     PCP: Marlou Sa, ERIC, MD Reason for Consultation: Clarification of [place] andoptions     Phone Number:[phone]  Assessment of patients Current state:   Continued physical and functional and cognitive decline  decline 2/2 to  multiple co-morbidities; h/o pancreatic cancer, portal hypertension, portal vein thrombosis s/p whipple procedure, severe protein calorie mal-nutrition, ascites/multiple paracentesis.  Overall failure to thrive, limited medical options.  Patient is faced with advanced directive decisions and anticipatory care needs.   Consult is for review of medical treatment options, clarification of goals of care and end of life issues, disposition and options, and symptom recommendation.  This NP Wadie Lessen reviewed medical records, received report from team, assessed the patient and then meet at the patient's bedside along with her daughter and SO [name] and then by telephone with her son Vicente Serene  to discuss diagnosis prognosis, [place], EOL wishes disposition and options.  A detailed discussion was had today regarding advanced directives.  Concepts specific to code status, artifical feeding and hydration, continued IV antibiotics and rehospitalization was had.  The difference between a aggressive medical intervention path  and a palliative comfort care path for this patient at this time was had.  Values and goals of care important to patient and family were attempted to be elicited.  Concept of Hospice and Palliative Care were discussed  Natural trajectory and expectations at EOL were discussed.  Questions and concerns addressed.  Hard Choices booklet left for review. Family encouraged to call with questions or concerns.  PMT will continue to support holistically.   Goals of Care: 1.  Code Status: DNR/DNI-comfort is  main focus of care   2. Scope of Treatment:  Patient is hopeful for tunneled catheter for ascites before discharge home with hospice.  IR notifed  1. Vital Signs: per unit  2. Respiratory/Oxygen: as needed for comfort 3. Nutritional Support/Tube Feeds: no artifical feeding or hydration once discharged 4. Antibiotics: yes 5. IVF: until discharge   6. Review of Medications to be discontinued: continue current medications 7. Labs: until discharge   3. Disposition:  Hopefully home with hospice after tunneled catheter is placed for ascites buildup.   4. Symptom Management:   1.   Pain:        Oxycodone ER 10 mg po every 12 hrs        Oxycodone IR 5 mg po every 3 hrs prn 2.  Ascites: call placed to IR, consider pleurex before dc home for refractory ascites and to promote comfort 3.  Bowel Regimen: Dulcolax supp pr daily prn 4   Weight loss/protein calorie malnutrition: diet as tolerated to enhance comfort  5. Psychosocial:  Emotional support offered to pateint and her family.  All support a comfort approach and to put "things into God's hands"  6. Spiritual:  Chaplain consulted   Brief HPI:  64 y.o. female with history of adenocarcinoma of head of pancreas, status post Whipple's procedure 08/07/14, completed concurrent capecitabine and radiation, poor performance status and hence did not get adjuvant chemotherapy, recurrent Klebsiella bacteremia- attributed to intra-abdominal source/presumed septic portal vein thrombus, acute portal vein thrombosis-not on anticoagulation secondary to GI bleed, tobacco abuse & hepatitis C presented [facility] ED on 11/14/14 with complaints of progressive mental status changes,  abdominal distention/pain and poor appetite. Patient unable to provide history secondary to AMS. No family at bedside currently. History obtained from EDP who discussed with family. Patient apparently has been having progressive failure to thrive since Whipple's  procedure. However over the last 3 days, family have noticed gradually worsening confusion, disorientation and lethargic to a point of near unresponsiveness today, decreased oral intake of liquids and food but no nausea or vomiting, progressive abdominal and lower extremity swelling with intermittent abdominal pain and no fevers. Patient has been getting IV antibiotics through right upper extremity PICC line and therefore plans to remove PICC line and from IV Unasyn to oral cefuroxime. Diffuse portal vein system thrombosis, anasarca, diffuse moderate to severe bowel wall thickening, moderate bilateral pleural effusions and fluid filled and dilated small bowel loop draining the pancreas.   ROS: weakness, fatigue, weight loss, abdominal pain   PMH:  Past Medical History  Diagnosis Date  . Arthritis   . Depression   . H/O: substance abuse   . Back pain   . Personal history of colonic polyp - adenoma 10/26/2013    10/26/2013 diminutive sigmoid polyp removed    . Pancreatitis, acute   . Hypertension   . Osteoporosis   . Radiation 04/30/14-06/08/14    pancreas 50 gray  . Cancer     pancreatic cancer  . Hemorrhagic shock 09/14/2014  . Acute GI bleeding 09/14/2014  . Thrombocytopenia 09/16/2014  . 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 Acute respiratory failure with hypoxia   . Bacteremia due to Klebsiella pneumoniae   . Anemia due to acute blood loss 09/26/2014     PSH: Past Surgical History  Procedure Laterality Date  . Lumbar epidural injection    . Colonoscopy    . Ercp N/A 03/06/2014    Procedure: ENDOSCOPIC RETROGRADE CHOLANGIOPANCREATOGRAPHY (ERCP);  Surgeon: Gatha Mayer, MD;  Location: [facility] ENDOSCOPY;  Service: Endoscopy;  Laterality: N/A;  . Eus N/A 04/05/2014    Procedure: UPPER ENDOSCOPIC ULTRASOUND (EUS) RADIAL;  Surgeon: [name], MD;  Location: [facility] ENDOSCOPY;  Service: Endoscopy;  Laterality: N/A;  . Whipple procedure N/A 08/07/2014    Procedure:  DIAGNOSTIC LAPAROSCOPY,  WHIPPLE PROCEDURE;  Surgeon: Stark Klein, MD;  Location: [facility] ORS;  Service: General;  Laterality: N/A;  . Esophagogastroduodenoscopy N/A 09/28/2014    Procedure: ESOPHAGOGASTRODUODENOSCOPY (EGD);  Surgeon: Ladene Artist, MD;  Location: [facility] ENDOSCOPY;  Service: Endoscopy;  Laterality: N/A;   I have reviewed the [place] and SH and  If appropriate update it with new information. No Known Allergies Scheduled Meds: . ampicillin-sulbactam (UNASYN) IV  3 g Intravenous 4 times per day  . antiseptic oral rinse  7 mL Mouth Rinse q12n4p  . chlorhexidine  15 mL Mouth Rinse BID  . feeding supplement (RESOURCE BREEZE)  1 Container Oral TID BM  . lactulose  300 mL Rectal Once  . pantoprazole (PROTONIX) IV  40 mg Intravenous Q24H  . potassium chloride  40 mEq Oral BID  . sodium chloride  3 mL Intravenous Q12H   Continuous Infusions: . sodium chloride 75 mL/hr at 11/19/14 0428   PRN Meds:.acetaminophen, albuterol, hydrALAZINE, morphine injection, ondansetron (ZOFRAN) IV, sodium chloride    BP 129/84 mmHg  Pulse 82  Temp(Src) 98.9 F (37.2 C) (Oral)  Resp 16  Ht 5\' 3"  (1.6 m)  Wt 61.4 kg (135 lb 5.8 oz)  BMI 23.98 kg/m2  SpO2 99%   PPS: 30 %   Intake/Output Summary (Last 24 hours) at 11/19/14 1129  Last data filed at 11/19/14 0806  Gross per 24 hour  Intake 3863.75 ml  Output    725 ml  Net 3138.75 ml    Physical Exam:  General: ill appearing, NAD HEENT: moist buccal membranes, no exudate Chest:  Decreased in bases CVS: RRR Abdomen: distended and firm, tender to gentle palpation  Ext: BLE +2 edema Neuro: oriented X3 presently   Labs: CBC    Component Value Date/Time   WBC 4.1 11/19/2014 0520   WBC 4.2 06/22/2014 0935   RBC 2.52* 11/19/2014 0520   RBC 4.37 06/22/2014 0935   HGB 7.0* 11/19/2014 0520   HGB 13.7 06/22/2014 0935   HCT 21.8* 11/19/2014 0520   HCT 40.1 06/22/2014 0935   PLT 192 11/19/2014 0520   PLT 168 06/22/2014 0935   MCV 86.5 11/19/2014 0520   MCV 91.8  06/22/2014 0935   MCH 27.8 11/19/2014 0520   MCH 31.4 06/22/2014 0935   MCHC 32.1 11/19/2014 0520   MCHC 34.2 06/22/2014 0935   RDW 24.3* 11/19/2014 0520   RDW 15.2* 06/22/2014 0935   LYMPHSABS 0.6* 10/16/2014 1339   LYMPHSABS 0.5* 06/22/2014 0935   MONOABS 0.6 10/16/2014 1339   MONOABS 0.4 06/22/2014 0935   EOSABS 0.0 10/16/2014 1339   EOSABS 0.1 06/22/2014 0935   BASOSABS 0.0 10/16/2014 1339   BASOSABS 0.0 06/22/2014 0935    BMET    Component Value Date/Time   NA 138 11/19/2014 0520   NA 141 06/22/2014 0935   K 3.3* 11/19/2014 0520   K 4.1 06/22/2014 0935   CL 114* 11/19/2014 0520   CO2 20 11/19/2014 0520   CO2 22 06/22/2014 0935   GLUCOSE 93 11/19/2014 0520   GLUCOSE 79 06/22/2014 0935   BUN 6 11/19/2014 0520   BUN 10.7 06/22/2014 0935   CREATININE <0.30* 11/19/2014 0520   CREATININE 0.9 06/22/2014 0935   CALCIUM 7.8* 11/19/2014 0520   CALCIUM 9.6 06/22/2014 0935   GFRNONAA NOT CALCULATED 11/19/2014 0520   GFRAA NOT CALCULATED 11/19/2014 0520    CMP     Component Value Date/Time   NA 138 11/19/2014 0520   NA 141 06/22/2014 0935   K 3.3* 11/19/2014 0520   K 4.1 06/22/2014 0935   CL 114* 11/19/2014 0520   CO2 20 11/19/2014 0520   CO2 22 06/22/2014 0935   GLUCOSE 93 11/19/2014 0520   GLUCOSE 79 06/22/2014 0935   BUN 6 11/19/2014 0520   BUN 10.7 06/22/2014 0935   CREATININE <0.30* 11/19/2014 0520   CREATININE 0.9 06/22/2014 0935   CALCIUM 7.8* 11/19/2014 0520   CALCIUM 9.6 06/22/2014 0935   PROT 6.5 11/19/2014 0520   PROT 8.7* 06/08/2014 1013   ALBUMIN 2.0* 11/19/2014 0520   ALBUMIN 3.4* 06/08/2014 1013   AST 70* 11/19/2014 0520   AST 24 06/08/2014 1013   ALT 37* 11/19/2014 0520   ALT 15 06/08/2014 1013   ALKPHOS 671* 11/19/2014 0520   ALKPHOS 89 06/08/2014 1013   BILITOT 8.4* 11/19/2014 0520   BILITOT 0.47 06/08/2014 1013   GFRNONAA NOT CALCULATED 11/19/2014 0520   GFRAA NOT CALCULATED 11/19/2014 0520     Time In Time Out Total Time Spent with  Patient Total Overall Time  1000 1115 70 min 75 min    Greater than 50%  of this time was spent counseling and coordinating care related to the above assessment and plan.   Wadie Lessen NP  Palliative Medicine Team Team Phone # [phone] Pager [phone]  Discussed with Dr  Mikhail

## 2014-11-19 NOTE — Progress Notes (Signed)
ANTIBIOTIC CONSULT NOTE  Pharmacy Consult for Unasyn Indication: Intra-abdominal infection, history of recurrent Klebsiella bacteremia, R/o SBP  No Known Allergies  Patient Measurements: Height: 5\' 3"  (160 cm) Weight: 135 lb 5.8 oz (61.4 kg) IBW/kg (Calculated) : 52.4  Assessment: 50 y/oF with PMH of adenocarcinoma of head of pancreas, s/p Whipple's procedure, recurrent Klebsiella bacteremia attributed to intra-abdominal source/presumed septic portal vein thrombus, acute portal vein thrombosis, hepatitis C, tobacco abuse who presents to [facility] ED with complaints of progressive mental status changes, abdominal distention/pain, and poor appetite. Patient has been receiving Unasyn through PICC line, but per ID note 2/23 was to changed to cefuroxime to complete therapy for polymicrobial bacteremia.  Pharmacy consulted today to dose Unasyn for r/o SBP, recurrent Klebsiella bacteremia. ID is following  Antiinfectives  PTA, started 1/31 >> Unasyn >> 3/8 Currently on day 34 of 42 days of therapy for polymicrobial bacteremia per ID with plan to change abx's to Ceftin when discharged and continue thru 3/8  Labs / vitals Tmax: remains afebrile WBCs: now remains WNL Renal: SCr stable (appears at baseline), CrCl ~61, 84N  Microbiology 1/26 blood: Klebsiella pneumoniae, Streptococcus species 2/24 blood x 2: NGTD 2/24 MRSA PCR: negative 2/25 ascites fluid: ngtd  Goal of Therapy:  Appropriate antibiotic dosing for renal function and indication Eradication of infection  Plan:   Continue Unasyn 3 g IV q6h and plan as per above when discharged  Monitor renal function, cultures, clinical course.  Adrian Saran, PharmD, BCPS Pager [phone] 11/19/2014 11:18 AM

## 2014-11-19 NOTE — Progress Notes (Signed)
OT Cancellation Note  Patient Details Name: Melinda Conrad MRN: [id number] DOB: [birth year]   Cancelled Treatment:    Reason Eval/Treat Not Completed: Fatigue/lethargy limiting ability to participate. Will continue to follow.  Malka So 11/19/2014, 9:58 AM

## 2014-11-19 NOTE — Progress Notes (Signed)
Patient refused to have paracentesis done today, will try again tomorrow. Spoke with [facility] today regarding peritoneal PleurX catheter versus [place] shunt-[facility] previously spoke with Dr. Laurence Ferrari about, awaiting Cytology results from paracentesis and Palliative care consult.  Tsosie Billing PA-C Interventional Radiology  11/19/14  4:28 PM

## 2014-11-19 NOTE — Progress Notes (Signed)
Patient refusing all meds and procedures today.  States she does not feel good today.  Explained to patient that paracentesis would help with her complaints of abdomen distention and abdominal pain. She states she is tired of needles and just needs a break.

## 2014-11-20 ENCOUNTER — Inpatient Hospital Stay (HOSPITAL_COMMUNITY): Payer: Medicaid Other

## 2014-11-20 DIAGNOSIS — R1084 Generalized abdominal pain: Secondary | ICD-10-CM

## 2014-11-20 DIAGNOSIS — Z515 Encounter for palliative care: Secondary | ICD-10-CM

## 2014-11-20 DIAGNOSIS — Z66 Do not resuscitate: Secondary | ICD-10-CM

## 2014-11-20 LAB — TYPE AND SCREEN
ABO/RH(D): O POS
ANTIBODY SCREEN: NEGATIVE
UNIT DIVISION: 0

## 2014-11-20 LAB — ANAEROBIC CULTURE

## 2014-11-20 LAB — COMPREHENSIVE METABOLIC PANEL
ALBUMIN: 1.7 g/dL — AB (ref 3.5–5.2)
ALT: 35 U/L (ref 0–35)
AST: 64 U/L — ABNORMAL HIGH (ref 0–37)
Alkaline Phosphatase: 633 U/L — ABNORMAL HIGH (ref 39–117)
Anion gap: 5 (ref 5–15)
BUN: 5 mg/dL — AB (ref 6–23)
CALCIUM: 7.9 mg/dL — AB (ref 8.4–10.5)
CO2: 19 mmol/L (ref 19–32)
Chloride: 115 mmol/L — ABNORMAL HIGH (ref 96–112)
GLUCOSE: 80 mg/dL (ref 70–99)
POTASSIUM: 4.1 mmol/L (ref 3.5–5.1)
Sodium: 139 mmol/L (ref 135–145)
Total Bilirubin: 8.7 mg/dL — ABNORMAL HIGH (ref 0.3–1.2)
Total Protein: 6.5 g/dL (ref 6.0–8.3)

## 2014-11-20 LAB — ALBUMIN, FLUID (OTHER): Albumin, Fluid: <1 g/dL

## 2014-11-20 LAB — CULTURE, BLOOD (ROUTINE X 2): Culture: NO GROWTH

## 2014-11-20 LAB — CBC
HCT: 27 % — ABNORMAL LOW (ref 36.0–46.0)
HEMOGLOBIN: 9.2 g/dL — AB (ref 12.0–15.0)
MCH: 29.6 pg (ref 26.0–34.0)
MCHC: 34.1 g/dL (ref 30.0–36.0)
MCV: 86.8 fL (ref 78.0–100.0)
Platelets: 196 10*3/uL (ref 150–400)
RBC: 3.11 MIL/uL — AB (ref 3.87–5.11)
RDW: 22.8 % — ABNORMAL HIGH (ref 11.5–15.5)
WBC: 5.4 10*3/uL (ref 4.0–10.5)

## 2014-11-20 LAB — AMMONIA: AMMONIA: 92 umol/L — AB (ref 11–32)

## 2014-11-20 MED ORDER — OXYCODONE HCL 5 MG PO TABS
5.0000 mg | ORAL_TABLET | ORAL | Status: DC | PRN
  Administered 2014-11-20 – 2014-11-27 (×20): 5 mg via ORAL
  Filled 2014-11-20 (×20): qty 1

## 2014-11-20 MED ORDER — OXYCODONE HCL ER 10 MG PO T12A
10.0000 mg | EXTENDED_RELEASE_TABLET | Freq: Two times a day (BID) | ORAL | Status: DC
  Administered 2014-11-20 – 2014-11-22 (×5): 10 mg via ORAL
  Filled 2014-11-20 (×5): qty 1

## 2014-11-20 MED ORDER — BISACODYL 10 MG RE SUPP: 10.0000 mg | Freq: Every day | RECTAL | Status: DC | PRN

## 2014-11-20 MED ORDER — MORPHINE SULFATE 2 MG/ML IJ SOLN
2.0000 mg | Freq: Four times a day (QID) | INTRAMUSCULAR | Status: DC | PRN
  Administered 2014-11-21 – 2014-11-29 (×8): 2 mg via INTRAVENOUS
  Filled 2014-11-20 (×8): qty 1

## 2014-11-20 NOTE — Procedures (Signed)
US guided diagnostic/therapeutic paracentesis performed yielding 1.7 liters clear, yellow fluid. No immediate complications. A portion of the fluid was sent to the lab for preordered studies.

## 2014-11-20 NOTE — Progress Notes (Signed)
    Progress Note   Subjective  *Abdominal pain clearly decreased with therapeutic paracentesis.**   Objective  Vital signs in last 24 hours: Temp:  [98.2 F (36.8 C)-98.9 F (37.2 C)] 98.7 F (37.1 C) (03/01 0830) Pulse Rate:  [79-91] 79 (03/01 0830) Resp:  [16-18] 18 (03/01 0830) BP: (117-152)/(65-91) 128/74 mmHg (03/01 1011) SpO2:  [99 %-100 %] 100 % (03/01 0830) Weight:  [138 lb 10.7 oz (62.9 kg)] 138 lb 10.7 oz (62.9 kg) (03/01 0500) Last BM Date: 11/20/14 :  Alert and cooperative. Normal mood and affect.  Intake/Output from previous day: 02/29 0701 - 03/01 0700 In: 2230 [P.O.:420; I.V.:1350; Blood:260; IV Piggyback:200] Out: 676 [Urine:675; Stool:1] Intake/Output this shift: Total I/O In: 300 [I.V.:300] Out: -   Lab Results:  Recent Labs  11/18/14 0545 11/19/14 0520 11/20/14 0500  WBC 5.7 4.1 5.4  HGB 7.6* 7.0* 9.2*  HCT 23.2* 21.8* 27.0*  PLT 219 192 196   BMET  Recent Labs  11/18/14 0545 11/19/14 0520 11/20/14 0500  NA 137 138 139  K 3.1* 3.3* 4.1  CL 113* 114* 115*  CO2 20 20 19   GLUCOSE 93 93 80  BUN 7 6 5*  CREATININE 0.33* <0.30* <0.30*  CALCIUM 7.5* 7.8* 7.9*   LFT  Recent Labs  11/20/14 0500  PROT 6.5  ALBUMIN 1.7*  AST 64*  ALT 35  ALKPHOS 633*  BILITOT 8.7*   PT/INR No results for input(s): LABPROT, INR in the last 72 hours. Hepatitis Panel No results for input(s): HEPBSAG, HCVAB, HEPAIGM, HEPBIGM in the last 72 hours.  Studies/Results: No results found.    Assessment & Plan  *At this point it appears that I will we are switching to a palliative mode.  For symptomatic ascites she can either have periodic paracentesis or [place] shunt placement, provided that there is no evidence for SBP.  Await palliative care consult. * Principal Problem:   Hepatic encephalopathy Active Problems:   Pancreatic cancer   Protein-calorie malnutrition, severe   Portal vein thrombosis   Hypokalemia   Anemia in neoplastic disease  Recurrent bacteremia   Hyperammonemia   Hepatitis C   Ascites/anasarca   Dehydration   Failure to thrive in adult   Abdominal pain, acute     LOS: 6 days   Erskine Emery  11/20/2014, 11:38 AM

## 2014-11-20 NOTE — Progress Notes (Signed)
OT Cancellation Note  Patient Details Name: Melinda Conrad MRN: [id number] DOB: [birth year]   Cancelled Treatment:    Reason Eval/Treat Not Completed: Pain limiting ability to participate. Pt states pain 6/10 and she is is just up for even sitting up on EOB. Nursing aware of pain and addressed. Will reattempt later time.  [place], [employer] 11/20/2014, 12:36 PM

## 2014-11-20 NOTE — Progress Notes (Addendum)
[facility]                                                                              Patient Demographics  Melinda Conrad, is a 64 y.o. female, DOB - 1950-10-10, YQI:[id number]  Admit date - 11/14/2014   Admitting Physician Modena Jansky, MD  Outpatient Primary MD for the patient is Marlou Sa, ERIC, MD  LOS - 6   Chief Complaint  Patient presents with  . Ascites  . Weakness  . Altered Mental Status      HPI on 11/14/2014 by Dr. [name] Melinda Conrad is a 64 y.o. female with history of adenocarcinoma of head of pancreas, status post Whipple's procedure 08/07/14, completed concurrent capecitabine and radiation, poor performance status and hence did not get adjuvant chemotherapy, recurrent Klebsiella bacteremia- attributed to intra-abdominal source/presumed septic portal vein thrombus, acute portal vein thrombosis-not on anticoagulation secondary to GI bleed, tobacco abuse & hepatitis C presented to the [facility] ED on 11/14/14 with complaints of progressive mental status changes, abdominal distention/pain and poor appetite. Patient unable to provide history secondary to AMS. No family at bedside currently. History obtained from EDP who discussed with family. Patient apparently has been having progressive failure to thrive since Whipple's procedure. However over the last 3 days, family have noticed gradually worsening confusion, disorientation and lethargic to a point of near unresponsiveness today, decreased oral intake of liquids and food but no nausea or vomiting, progressive abdominal and lower extremity swelling with intermittent abdominal pain and no fevers. Patient has been getting IV antibiotics through right upper extremity PICC line and therefore plans to remove PICC line and from IV Unasyn to oral cefuroxime. In the ED, patient is somnolent but arousable, afebrile, elevated blood pressure, lab work significant for potassium <2, abnormal LFTs, ammonia 120,  hemoglobin 8.5, CT head without acute findings, CT abdomen and pelvis: Diffuse portal vein system thrombosis, anasarca, diffuse moderate to severe bowel wall thickening, moderate bilateral pleural effusions and fluid filled and dilated small bowel loop draining the pancreas. Hospitalist admission was requested.  Interim history Patient with history of pancreatic cancer underwent Whipple's procedure. Patient was told by oncology that she no longer had cancer. Question of possible carcinomatosis. Patient has had paracentesis twice. Patient continues to develop fluid and requires further tapping. Discussed with radiology possible catheter placement versus [place] shunt. Palliative care has been consulted and pending.  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/hepatitis C -Improved -CT of the head: No acute findings -Blood cultures: no growth to date -Gastroenterology consulted and appreciated -Ammonia level upon admission 120, now 19 (patient continues to refuse her lactulose enema and dose not want PO)  Anasarca/ascites -Secondary to liver disease, portal vein thrombosis and hypoalbuminemia with malnutrition -Interventional radiology consult appreciated -Patient had ultrasound-guided paracentesis with removal of 3 L (11/15/2014) -Patient empirically started on IV Unasyn to cover for possible SBP (as patient has had klebseilla bacteremia in the 2015) -Spoke with pathology: Fluid cytology shows atypical cells -Fluid culture: no growth to date, no organisms seen -Fluid chemistry not consistent with SBP -SAAG unable to accurately calculate as patient's ascitic albumin was not a specific number, rather <  1.   -Patient continues to have a firm abdomen with pain- US showed significant ascites on 11/16/2014 -Repeat US paracentesis on 11/17/2014 removed 3L- however patient's abdomen looks distended and firm -Spoke with daughter on 11/17/2014, regarding prognosis and Code status.   She would like to speak to her brother first.  She is amendable to palliative care consult. -Palliative care consulted and appreciated -Spoke with radiology, Dr. Laurence Ferrari, who recommended tunneled cath is cytology shows malignancy or [place] shunt if not malignant -Spoke with patient and her daughter about outlook and prognosis.  They would like to have Dr. Barry Dienes (general surgery) weigh in- pending. -Patient also given albumin on 2/28.   -Third paracentesis ordered on "Sunday, 11/18/2014 however patient refused, rescheduled for 2/29, patient refused again. -Planned for today, 11/20/2014.  Previous Klebsiella and streptococcal bacteremia -ID consulted and appreciated -Patient needs to complete 42 day course of antibiotics -ID recommended Cefuroxime 500mg BID through March 8th when patient discharged- so she no longer needs PICC line -Thus far blood cultures and fluid cultures negative  Severe hypokalemia -Magnesium 1.9 -Resolved, will continue to monitor in place as needed   Dehydration -Secondary to poor oral intake and continue IV fluids  Portal vein thrombosis -Patient has had history of GI bleed is currently not anticoagulation candidate  Anemia secondary to chronic disease -Baseline approximately 8 -Hemoglobin dropped to 7.0, patient was transfused 1 unit on 2/29, hemoglobin currently 9.2 -Continue to monitor CBC  Hepatitis C -Currently not on treatment  Severe protein calorie malnutrition/failure to thrive -Nutrition consulted and appreciated -Patient started on clear liquid diet with nutritional supplementation  Pancreatic adenocarcinoma -S/p Whipple's procedure -Oncology consulted and appreciated  ?Small bowel ileus  Code Status: Full  Family Communication: None at bedside; Spoke with daughter, who is POA.   Disposition Plan: Admitted- Paracentesis planned for today. Palliative care meeting also planned.  Time Spent in minutes   30 minutes  Procedures    Ultrasound guided paracentesis 2/25 and 2/27  Consults   Interventional radiology Gastroenterology General surgery Oncology Infectious Disease Palliative care  DVT Prophylaxis  SCDs  Lab Results  Component Value Date   PLT 196 11/20/2014    Medications  Scheduled Meds: . ampicillin-sulbactam (UNASYN) IV  3 g Intravenous 4 times per day  . antiseptic oral rinse  7 mL Mouth Rinse q12n4p  . chlorhexidine  15 mL Mouth Rinse BID  . feeding supplement (RESOURCE BREEZE)  1 Container Oral TID BM  . lactulose  300 mL Rectal Once  . lactulose  300 mL Rectal Once  . pantoprazole (PROTONIX) IV  40 mg Intravenous Q24H  . potassium chloride  40 mEq Oral BID  . sodium chloride  3 mL Intravenous Q12H   Continuous Infusions: . sodium chloride 75 mL/hr at 11/19/14 2340   PRN Meds:.acetaminophen, albuterol, hydrALAZINE, morphine injection, ondansetron (ZOFRAN) IV, sodium chloride  Antibiotics    Anti-infectives    Start     Dose/Rate Route Frequency Ordered Stop   11/15/14 0000  Ampicillin-Sulbactam (UNASYN) 3 g in sodium chloride 0.9 % 100 mL IVPB     3 g 100 mL/hr over 60 Minutes Intravenous 4 times per day 11/14/14 1616     02" /24/16 1615  Ampicillin-Sulbactam (UNASYN) 3 g in sodium chloride 0.9 % 100 mL IVPB     3 g 100 mL/hr over 60 Minutes Intravenous STAT 11/14/14 1609 11/14/14 1827   11/14/14 1600  ampicillin-sulbactam (UNASYN) 1.5 g in sodium chloride 0.9 % 50 mL IVPB  Status:  Discontinued     1.5 g 100 mL/hr over 30 Minutes Intravenous  Once 11/14/14 1527 11/14/14 1609        Subjective:   [name] seen and examined today.  Patient states she's feeling better. She states that she will have the paracentesis today. Patient denies any shortness of breath or chest pain at this time.  Objective:   Filed Vitals:   11/20/14 0830 11/20/14 0935 11/20/14 1003 11/20/14 1011  BP: 125/81 129/80 122/65 128/74  Pulse: 79     Temp: 98.7 F (37.1 C)     TempSrc: Oral      Resp: 18     Height:      Weight:      SpO2:        Wt Readings from Last 3 Encounters:  11/20/14 62.9 kg (138 lb 10.7 oz)  10/29/14 50.395 kg (111 lb 1.6 oz)  10/17/14 45.6 kg (100 lb 8.5 oz)     Intake/Output Summary (Last 24 hours) at 11/20/14 1038 Last data filed at 11/20/14 1000  Gross per 24 hour  Intake   2410 ml  Output    676 ml  Net   1734 ml    Exam  General: Well developed, thin  Cardiovascular: S1 S2 auscultated,RRR  Respiratory: Diminished breath sounds  Abdomen: Firm, distended, diffusely tender, hypoactive bowel sounds  Neuro: AAO X3.  Nonfocal  Data Review   Micro Results Recent Results (from the past 240 hour(s))  Blood culture (routine x 2)     Status: None   Collection Time: 11/14/14 12:30 PM  Result Value Ref Range Status   Specimen Description BLOOD RIGHT HAND  Final   Special Requests BOTTLES DRAWN AEROBIC ONLY 4CC  Final   Culture   Final    NO GROWTH 5 DAYS Performed at [facility]    Report Status 11/20/2014 FINAL  Final  MRSA PCR Screening     Status: None   Collection Time: 11/14/14  5:15 PM  Result Value Ref Range Status   MRSA by PCR NEGATIVE NEGATIVE Final    Comment:        The GeneXpert MRSA Assay (FDA approved for NASAL specimens only), is one component of a comprehensive MRSA colonization surveillance program. It is not intended to diagnose MRSA infection nor to guide or monitor treatment for MRSA infections.   Body fluid culture     Status: None   Collection Time: 11/15/14 12:10 PM  Result Value Ref Range Status   Specimen Description ASCITIC  Final   Special Requests NONE  Final   Gram Stain   Final    RARE WBC PRESENT, PREDOMINANTLY PMN NO ORGANISMS SEEN Performed at [facility]    Culture   Final    NO GROWTH 3 DAYS Performed at [facility]    Report Status 11/18/2014 FINAL  Final  Anaerobic culture     Status: None (Preliminary result)   Collection Time: 11/15/14 12:10 PM   Result Value Ref Range Status   Specimen Description FLUID ASCITIES  Final   Special Requests NONE  Final   Gram Stain   Final    RARE WBC PRESENT, PREDOMINANTLY PMN NO ORGANISMS SEEN Performed at [facility]    Culture   Final    NO ANAEROBES ISOLATED; CULTURE IN PROGRESS FOR 5 DAYS Performed at [facility]    Report Status PENDING  Incomplete    Radiology Reports Dg Abd 1 View  11/16/2014  CLINICAL DATA:  Abdominal pain and distention.  EXAM: ABDOMEN - 1 VIEW  COMPARISON:  CT on 11/14/2014  FINDINGS: Chronic and bowel loops in the central abdomen is consistent with ascites. Surgical clips and piece of catheter or stent again seen in right upper quadrant. Mild dilatation of small bowel loops and colon is seen in the central abdomen likely due to on adynamic ileus. Some bowel gas in contrast is seen in the rectum.  IMPRESSION: Ascites and probable adynamic ileus.   Electronically Signed   By: Earle Gell M.D.   On: 11/16/2014 16:06   Ct Head Wo Contrast  11/14/2014   CLINICAL DATA:  Altered mental status  EXAM: CT HEAD WITHOUT CONTRAST  TECHNIQUE: Contiguous axial images were obtained from the base of the skull through the vertex without intravenous contrast.  COMPARISON:  October 27, 2005  FINDINGS: The ventricles are normal in size and configuration. There is no mass, hemorrhage, extra-axial fluid collection, or midline shift. Gray-white compartments are normal. No acute infarct apparent. The bony calvarium appears intact. The mastoid air cells are clear.  IMPRESSION: No intracranial mass, hemorrhage, or focal gray -white compartment lesions/acute appearing infarct.   Electronically Signed   By: Lowella Grip III M.D.   On: 11/14/2014 14:17   US Abdomen Complete  11/14/2014   CLINICAL DATA:  Pancreatic cancer post Whipple procedure 08/07/2014, abdominal swelling for 1 week, prior cholecystectomy, hypertension, radiation therapy, smoker  EXAM: ULTRASOUND ABDOMEN  COMPLETE  COMPARISON:  CT abdomen and pelvis 10/16/2014  FINDINGS: Gallbladder: Surgically absent  Common bile duct: Diameter: 5 mm diameter, normal  Liver: Heterogeneous in echogenicity. No discrete mass. Portal vein is not well demonstrated ; portal vein demonstrated thrombosis on the prior CT.  IVC: Intrahepatic portion normal appearance. Infrahepatic portion nonvisualized due to bowel gas.  Pancreas: Post Whipple procedure by history. Pancreatic bed inadequately visualized due to bowel gas.  Spleen: Normal appearance, 5.0 cm length  Right Kidney: Length: 9.3 cm. Normal morphology without mass or hydronephrosis.  Left Kidney: Length: 10.0 cm. Normal morphology without mass or hydronephrosis.  Abdominal aorta: Proximally normal caliber, remainder obscured by bowel gas  Other findings: Significant ascites throughout abdomen and pelvis.  IMPRESSION: Significant ascites significantly increased since prior CT exam, likely cause of abdominal swelling.  Limited exam due to bowel gas, with inadequate visualization of the pancreatic bed, portal vein and portions of the aorta.  Post cholecystectomy.   Electronically Signed   By: Lavonia Dana M.D.   On: 11/14/2014 14:32   Ct Abdomen Pelvis W Contrast  11/14/2014   CLINICAL DATA:  64 year old female with abdominal pain and distension. Current history of pancreatic cancer status post Whipple and radiation. Subsequent encounter.  EXAM: CT ABDOMEN AND PELVIS WITH CONTRAST  TECHNIQUE: Multidetector CT imaging of the abdomen and pelvis was performed using the standard protocol following bolus administration of intravenous contrast.  CONTRAST:  139mL OMNIPAQUE IOHEXOL 300 MG/ML  SOLN  COMPARISON:  10/16/2014 and earlier.  FINDINGS: New moderate size bilateral pleural effusions with lower lobe atelectasis. No pericardial effusion.  Advanced degenerative changes in the lower lumbar spine. No acute osseous abnormality identified.  Increased body wall stranding and density in  keeping with a degree of anasarca. Moderate to large volume of ascites in the abdomen has significantly progressed, and in the upper abdomen this is associated with new lobular mass effect on the liver contour (series 2, image 17).  No dilated large bowel. Widespread small and large bowel wall thickening.  Pancreatic or biliary stent re- identified within a small bowel loop in the right upper quadrant which today is dilated with fluid up to 4 cm diameter. There is continued intra and extrahepatic biliary ductal dilatation. Pancreatic tail atrophy again noted.  In January of the main portal vein was thrombosed. On today images no portal venous system enhancement is identified.  The stomach is thick walled. Oral contrast was administered, and has reached mid small bowel loops.  Major arterial structures in the abdomen and pelvis appear patent. Aortoiliac calcified atherosclerosis noted. Renal enhancement and contrast excretion is within normal limits.  IMPRESSION: 1. Progression of disease. Progressed and now diffuse portal venous system thrombosis since January. 2. Anasarca. Large volume ascites, mass effect on the liver may reflect subcapsular or loculated ascites. Diffuse moderate to severe bowel wall thickening. Moderate bilateral pleural effusions. 3. Fluid-filled and dilated small bowel loop draining the pancreas.   Electronically Signed   By: Genevie Ann M.D.   On: 11/14/2014 14:42   US Abdomen Limited  11/16/2014   CLINICAL DATA:  Abdominal distention question ascites, history Whipple procedure, hypertension, pancreatic cancer  EXAM: LIMITED ABDOMEN ULTRASOUND FOR ASCITES  TECHNIQUE: Limited ultrasound survey for ascites was performed in all four abdominal quadrants.  COMPARISON:  11/14/2014 CT abdomen and pelvis  FINDINGS: Significant ascites is identified in both lower quadrants.  Smaller amount of ascites is seen in the LEFT upper quadrant, with even less in the perihepatic region.  IMPRESSION: Significant  ascites.   Electronically Signed   By: Lavonia Dana M.D.   On: 11/16/2014 17:34   US Paracentesis  11/17/2014   INDICATION: ascites  CLINICAL DATA:  Request for therapeutic paracentesis  EXAM: ULTRASOUND-GUIDED PARACENTESIS  ULTRASOUND GUIDED RLQ PARACENTESIS  COMPARISON:  None.  MEDICATIONS: 10 cc 1% lidocaine  COMPLICATIONS: None immediate  TECHNIQUE: Informed written consent was obtained from the patient after a discussion of the risks, benefits and alternatives to treatment. A timeout was performed prior to the initiation of the procedure.  Initial ultrasound scanning demonstrates a large amount of ascites within the right lower abdominal quadrant. The right lower abdomen was prepped and draped in the usual sterile fashion. 1% lidocaine with epinephrine was used for local anesthesia. Under direct ultrasound guidance, a 19 gauge, 7-cm, Yueh catheter was introduced. An ultrasound image was saved for documentation purposed. the paracentesis was performed. The catheter was removed and a dressing was applied. The patient tolerated the procedure well without immediate post procedural complication.  FINDINGS: A total of approximately 3 liters of serous fluid was removed.  IMPRESSION: Successful ultrasound-guided paracentesis yielding 3 liters of peritoneal fluid.  PROCEDURE: Ultrasound was performed to localize and mark an adequate pocket of fluid in the right quadrant of the abdomen. The area was then prepped and draped in the normal sterile fashion. 1% Lidocaine was used for local anesthesia. Under ultrasound guidance a 19 gauge Yueh catheter was introduced. Paracentesis was performed. The catheter was removed and a dressing applied.  Read by:  Lavonia Drafts [facility]   Electronically Signed   By: Jacqulynn Cadet M.D.   On: 11/17/2014 11:31   US Paracentesis  11/15/2014   CLINICAL DATA:  Ascites, history of pancreatic cancer. Request therapeutic and diagnostic paracentesis of up to 3 L max.  EXAM: ULTRASOUND  GUIDED PARACENTESIS  COMPARISON:  None.  PROCEDURE: An ultrasound guided paracentesis was thoroughly discussed with the patient and questions answered. The benefits, risks, alternatives and complications were also discussed. The patient understands  and wishes to proceed with the procedure. Written consent was obtained.  Ultrasound was performed to localize and mark an adequate pocket of fluid in the left lower quadrant of the abdomen. The area was then prepped and draped in the normal sterile fashion. 1% Lidocaine was used for local anesthesia. Under ultrasound guidance a 19 gauge Yueh catheter was introduced. Paracentesis was performed. The catheter was removed and a dressing applied.  COMPLICATIONS: None immediate  FINDINGS: A total of approximately 3 L of clear yellow fluid was removed. A fluid sample was sent for laboratory analysis.  IMPRESSION: Successful ultrasound guided paracentesis yielding 3 L of ascites.  Read by: Ascencion Dike PA-C   Electronically Signed   By: Corrie Mckusick D.O.   On: 11/15/2014 12:55    CBC  Recent Labs Lab 11/16/14 0425 11/17/14 0505 11/18/14 0545 11/19/14 0520 11/20/14 0500  WBC 5.8 4.5 5.7 4.1 5.4  HGB 7.9* 7.9* 7.6* 7.0* 9.2*  HCT 23.7* 23.7* 23.2* 21.8* 27.0*  PLT 226 242 219 192 196  MCV 84.9 85.3 85.9 86.5 86.8  MCH 28.3 28.4 28.1 27.8 29.6  MCHC 33.3 33.3 32.8 32.1 34.1  RDW 21.9* 22.9* 23.9* 24.3* 22.8*    Chemistries   Recent Labs Lab 11/14/14 1154  11/16/14 0425 11/17/14 0505 11/18/14 0545 11/19/14 0520 11/20/14 0500  NA 139  < > 141 136 137 138 139  K <2.0*  < > 3.1* 3.6 3.1* 3.3* 4.1  CL 108  < > 116* 114* 113* 114* 115*  CO2 21  < > 21 20 20 20 19   GLUCOSE 101*  < > 80 110* 93 93 80  BUN 12  < > 10 9 7 6  5*  CREATININE 0.54  < > 0.44* 0.49* 0.33* <0.30* <0.30*  CALCIUM 7.7*  < > 7.3* 7.2* 7.5* 7.8* 7.9*  MG 1.9  --   --   --   --   --   --   AST 128*  < > 127* 117* 100* 70* 64*  ALT 53*  < > 55* 56* 50* 37* 35  ALKPHOS 1216*  <  > 969* 965* 905* 671* 633*  BILITOT 4.9*  < > 5.4* 6.1* 6.3* 8.4* 8.7*  < > = values in this interval not displayed. ------------------------------------------------------------------------------------------------------------------ CrCl cannot be calculated (Patient has no serum creatinine result on file.). ------------------------------------------------------------------------------------------------------------------ No results for input(s): HGBA1C in the last 72 hours. ------------------------------------------------------------------------------------------------------------------ No results for input(s): CHOL, HDL, LDLCALC, TRIG, CHOLHDL, LDLDIRECT in the last 72 hours. ------------------------------------------------------------------------------------------------------------------ No results for input(s): TSH, T4TOTAL, T3FREE, THYROIDAB in the last 72 hours.  Invalid input(s): FREET3 ------------------------------------------------------------------------------------------------------------------ No results for input(s): VITAMINB12, FOLATE, FERRITIN, TIBC, IRON, RETICCTPCT in the last 72 hours.  Coagulation profile  Recent Labs Lab 11/14/14 1153  INR 1.60*    No results for input(s): DDIMER in the last 72 hours.  Cardiac Enzymes No results for input(s): CKMB, TROPONINI, MYOGLOBIN in the last 168 hours.  Invalid input(s): CK ------------------------------------------------------------------------------------------------------------------ Invalid input(s): POCBNP    Martena Emanuele D.O. on 11/20/2014 at 10:38 AM  Between 7am to 7pm - Pager - [phone]  After 7pm go to [email or web address] - password [email or web address]  And look for the night coverage person covering for me after hours  [facility] Office  [phone]

## 2014-11-20 NOTE — Progress Notes (Signed)
  Subjective: Pt got paracentesis again today.  Says she is eating more.    Objective: Vital signs in last 24 hours: Temp:  [98.2 F (36.8 C)-98.9 F (37.2 C)] 98.7 F (37.1 C) (03/01 0830) Pulse Rate:  [79-91] 79 (03/01 0830) Resp:  [16-18] 18 (03/01 0830) BP: (117-152)/(65-91) 128/74 mmHg (03/01 1011) SpO2:  [99 %-100 %] 100 % (03/01 0830) Weight:  [138 lb 10.7 oz (62.9 kg)] 138 lb 10.7 oz (62.9 kg) (03/01 0500) Last BM Date: 11/20/14  Intake/Output from previous day: 02/29 0701 - 03/01 0700 In: 2230 [P.O.:420; I.V.:1350; Blood:260; IV Piggyback:200] Out: 676 [Urine:675; Stool:1] Intake/Output this shift: Total I/O In: 300 [I.V.:300] Out: -   General appearance: alert, cooperative, cachectic and mild distress Chest wall: breathing comfortably GI: soft, mildly distended, non tender Decreased ascites  Lab Results:   Recent Labs  11/19/14 0520 11/20/14 0500  WBC 4.1 5.4  HGB 7.0* 9.2*  HCT 21.8* 27.0*  PLT 192 196   BMET  Recent Labs  11/19/14 0520 11/20/14 0500  NA 138 139  K 3.3* 4.1  CL 114* 115*  CO2 20 19  GLUCOSE 93 80  BUN 6 5*  CREATININE <0.30* <0.30*  CALCIUM 7.8* 7.9*   PT/INR No results for input(s): LABPROT, INR in the last 72 hours. ABG No results for input(s): PHART, HCO3 in the last 72 hours.  Invalid input(s): PCO2, PO2  Studies/Results: No results found.  Anti-infectives: Anti-infectives    Start     Dose/Rate Route Frequency Ordered Stop   11/15/14 0000  Ampicillin-Sulbactam (UNASYN) 3 g in sodium chloride 0.9 % 100 mL IVPB     3 g 100 mL/hr over 60 Minutes Intravenous 4 times per day 11/14/14 1616     11/14/14 1615  Ampicillin-Sulbactam (UNASYN) 3 g in sodium chloride 0.9 % 100 mL IVPB     3 g 100 mL/hr over 60 Minutes Intravenous STAT 11/14/14 1609 11/14/14 1827   11/14/14 1600  ampicillin-sulbactam (UNASYN) 1.5 g in sodium chloride 0.9 % 50 mL IVPB  Status:  Discontinued     1.5 g 100 mL/hr over 30 Minutes Intravenous   Once 11/14/14 1527 11/14/14 1609      Assessment/Plan: s/p * No surgery found * pancreatic cancer, s/p whipple  Very malnourished.  Nothing surgical to do to d/c thrombosis.  Discussed the importance of nutrition with patient and daughter.  The ascites is a combination of the portal hypertension and the malnutrition.    Agree with palliative care for patient.  She may rally if her nutrition gets better, but she may not.  Questionable benefit to TNA.  That would be very aggressive for her condition.     LOS: 6 days    [facility] 11/20/2014

## 2014-11-21 DIAGNOSIS — R109 Unspecified abdominal pain: Secondary | ICD-10-CM

## 2014-11-21 DIAGNOSIS — Z7189 Other specified counseling: Secondary | ICD-10-CM

## 2014-11-21 LAB — CBC
HEMATOCRIT: 25.5 % — AB (ref 36.0–46.0)
HEMOGLOBIN: 8.5 g/dL — AB (ref 12.0–15.0)
MCH: 29.7 pg (ref 26.0–34.0)
MCHC: 33.3 g/dL (ref 30.0–36.0)
MCV: 89.2 fL (ref 78.0–100.0)
Platelets: 153 10*3/uL (ref 150–400)
RBC: 2.86 MIL/uL — ABNORMAL LOW (ref 3.87–5.11)
RDW: 23.3 % — ABNORMAL HIGH (ref 11.5–15.5)
WBC: 4.9 10*3/uL (ref 4.0–10.5)

## 2014-11-21 LAB — COMPREHENSIVE METABOLIC PANEL
ALK PHOS: 541 U/L — AB (ref 39–117)
ALT: 32 U/L (ref 0–35)
ANION GAP: 2 — AB (ref 5–15)
AST: 59 U/L — ABNORMAL HIGH (ref 0–37)
Albumin: 1.4 g/dL — ABNORMAL LOW (ref 3.5–5.2)
BUN: <5 mg/dL — ABNORMAL LOW (ref 6–23)
CALCIUM: 7.6 mg/dL — AB (ref 8.4–10.5)
CO2: 19 mmol/L (ref 19–32)
Chloride: 118 mmol/L — ABNORMAL HIGH (ref 96–112)
Creatinine, Ser: <0.3 mg/dL — ABNORMAL LOW (ref 0.50–1.10)
Glucose, Bld: 93 mg/dL (ref 70–99)
POTASSIUM: 4.4 mmol/L (ref 3.5–5.1)
Sodium: 139 mmol/L (ref 135–145)
Total Bilirubin: 8 mg/dL — ABNORMAL HIGH (ref 0.3–1.2)
Total Protein: 6.1 g/dL (ref 6.0–8.3)

## 2014-11-21 LAB — TOTAL BILIRUBIN, BODY FLUID: TOTBILIFLUID: 0.2 mg/dL

## 2014-11-21 LAB — AMMONIA: AMMONIA: 73 umol/L — AB (ref 11–32)

## 2014-11-21 MED ORDER — FUROSEMIDE 20 MG PO TABS
20.0000 mg | ORAL_TABLET | Freq: Every day | ORAL | Status: DC
  Administered 2014-11-21: 20 mg via ORAL
  Filled 2014-11-21 (×2): qty 1

## 2014-11-21 MED ORDER — SPIRONOLACTONE 50 MG PO TABS
50.0000 mg | ORAL_TABLET | Freq: Every day | ORAL | Status: DC
  Administered 2014-11-21: 50 mg via ORAL
  Filled 2014-11-21 (×2): qty 1

## 2014-11-21 NOTE — Progress Notes (Signed)
Physical Therapy Treatment Patient Details Name: Melinda Conrad MRN: [id number] DOB: [birth year] Today's Date: 11/21/2014    History of Present Illness 64 yo female admitted 11/14/14 with Altered mental status, abdominal distention and pain and poor appetite, patient has h/o pancreatic cancer, portal vein thrombosis,     PT Comments    Patient/spouse report that patient was just  OOB, assisted onto bedpan x 2. Spouse requesting  Different bedpan like used at home. Provided  Patient with such.  Continue as patient is able to mobilize.  Follow Up Recommendations  Home health PT;Supervision/Assistance - 24 hour     Equipment Recommendations  Wheelchair (measurements PT)    Recommendations for Other Services       Precautions / Restrictions Precautions Precautions: Fall Precaution Comments: abdominal surgery    Mobility  Bed Mobility Overal bed mobility: Needs Assistance             General bed mobility comments: pt  requested Bedpan, spouse present  and assisted getting pan under patient x 2, as patient  had urge. of note, patient has a catheter and reports she feels like she   leaks around it. declined sitting up, statres she just got back to bed after being up to day bed with spouse assisting.  Transfers                    Ambulation/Gait                 Stairs            Wheelchair Mobility    Modified Rankin (Stroke Patients Only)       Balance                                    Cognition Arousal/Alertness: Awake/alert Behavior During Therapy: Anxious                        Exercises      General Comments        Pertinent Vitals/Pain Faces Pain Scale: Hurts even more Pain Location: abdomen, feels like needs to urinate (has a  catheter) Pain Descriptors / Indicators: Aching;Burning;Cramping;Crying Pain Intervention(s): Limited activity within patient's tolerance;Repositioned    Home Living                       Prior Function            PT Goals (current goals can now be found in the care plan section) Progress towards PT goals: Not progressing toward goals - comment (patient has limited participation)    Frequency  Min 3X/week    PT Plan Current plan remains appropriate    Co-evaluation             End of Session   Activity Tolerance: Patient limited by fatigue;Patient limited by pain Patient left: in bed;with call bell/phone within reach;with family/visitor present     Time: 3710-6269 PT Time Calculation (min) (ACUTE ONLY): 27 min  Charges:  $Self Care/Home Management: 23-37                    G Codes:      Claretha Cooper 11/21/2014, 4:51 PM Tresa Endo PT [phone]

## 2014-11-21 NOTE — Progress Notes (Signed)
Pt was sitting up and awake in bed when I arrived. She told me she was expecting me, but not today. Pt said her pastor had been by; still wanted Chaplain visit. Pt talked about family; her children and grandchildren (mostly). Pt suddenly said she needed to go to bathroom (could hardly hold it) and called for help. Pt thanked me for coming by and asked me to come back another time because she wanted to talk with me about something. I told pt I would return the visit. Ernest Haber Chaplain   11/21/14 1500  Clinical Encounter Type  Visited With Patient

## 2014-11-21 NOTE — Progress Notes (Signed)
Progress Note   Subjective  awoken from sleep. Some mild diffuse abdominal pain but overall feels okay.    Objective   Vital signs in last 24 hours: Temp:  [97.9 F (36.6 C)-98.7 F (37.1 C)] 98.7 F (37.1 C) (03/02 0545) Pulse Rate:  [71-88] 71 (03/02 0545) Resp:  [16] 16 (03/02 0545) BP: (107-133)/(71-82) 117/73 mmHg (03/02 0545) SpO2:  [99 %-100 %] 99 % (03/02 0545) Weight:  [147 lb 7.8 oz (66.9 kg)] 147 lb 7.8 oz (66.9 kg) (03/02 0545) Last BM Date: 11/20/14 General:    Pleasant black female in NAD Abdomen:  Soft, nontender and nondistended.  Extremities:  Without edema. Neurologic:  Alert and oriented,  grossly normal neurologically. Psych:  Cooperative. Normal mood and affect.  Intake/Output from previous day: 03/01 0701 - 03/02 0700 In: 2997.5 [P.O.:720; I.V.:1777.5; IV Piggyback:500] Out: 1025 [Urine:1025] Intake/Output this shift: Total I/O In: 422.5 [I.V.:322.5; IV Piggyback:100] Out: -   Lab Results:  Recent Labs  11/19/14 0520 11/20/14 0500 11/21/14 0530  WBC 4.1 5.4 4.9  HGB 7.0* 9.2* 8.5*  HCT 21.8* 27.0* 25.5*  PLT 192 196 153   BMET  Recent Labs  11/19/14 0520 11/20/14 0500 11/21/14 0530  NA 138 139 139  K 3.3* 4.1 4.4  CL 114* 115* 118*  CO2 20 19 19   GLUCOSE 93 80 93  BUN 6 5* <5*  CREATININE <0.30* <0.30* <0.30*  CALCIUM 7.8* 7.9* 7.6*   LFT  Recent Labs  11/21/14 0530  PROT 6.1  ALBUMIN 1.4*  AST 59*  ALT 32  ALKPHOS 541*  BILITOT 8.0*    Studies/Results: US Paracentesis  11/20/2014   INDICATION: Pancreatic cancer, hepatitis-C, ascites. Request is made for diagnostic and therapeutic paracentesis.  EXAM: ULTRASOUND-GUIDED DIAGNOSTIC AND THERAPEUTIC PARACENTESIS  COMPARISON:  Prior paracentesis on 11/17/2014  MEDICATIONS: None.  COMPLICATIONS: None immediate  TECHNIQUE: Informed written consent was obtained from the patient after a discussion of the risks, benefits and alternatives to treatment. A timeout was performed  prior to the initiation of the procedure.  Initial ultrasound scanning demonstrates a small amount of ascites within the right lower abdominal quadrant. The right lower abdomen was prepped and draped in the usual sterile fashion. 1% lidocaine was used for local anesthesia. Under direct ultrasound guidance, a 19 gauge, 7-cm, Yueh catheter was introduced. An ultrasound image was saved for documentation purposed. The paracentesis was performed. The catheter was removed and a dressing was applied. The patient tolerated the procedure well without immediate post procedural complication.  FINDINGS: A total of approximately 1.7 liters of clear, yellow fluid was removed. Samples were sent to the laboratory as requested by the clinical team.  IMPRESSION: Successful ultrasound-guided diagnostic and therapeutic paracentesis yielding 1.7 liters of peritoneal fluid.  Read by: Rowe Perseus Westall, PA-C   Electronically Signed   By: Jerilynn Mages.  Shick M.D.   On: 11/20/2014 10:53     Assessment / Plan:     64 year old female with pancreatic cancer, s/p whipple Nov 2015. Never could get adjuvant chemo due to recurrent hospitalizations for bacteremia, PV thrombosis and general decline. Now with ascites. Cytology reveals atypical cells. Cannot calculate SAAG since fluid protein <1. Patient has had 7 liters of ascitic fluid removed this admission. Her renal function is okay. Ascites may be secondary to portal HTN from PVT vrs  malignancy. Will try low dose diuretics with caveat that ascites may not respond if malignant. If ascites begins to re-accumulate would ask IR to evaluate for  [place] shunt for palliation.     LOS: 7 days   Melinda Conrad  11/21/2014, 11:25 AM   GI Attending Note  I have personally taken an interval history, reviewed the chart, and examined the patient.  I agree with the extender's note, impression and recommendations.  Her main complaint is abdominal pain related to ascites.  Ideally this can capped under control with  diuretics.  Failing that I would consider a [place] shunt  Melinda Baltimore D. Deatra Ina, MD, [facility] [phone]

## 2014-11-21 NOTE — Progress Notes (Signed)
[facility]                                                                              64 y.o.? adenocarcinoma of head of pancreas, status post Whipple's procedure 08/07/14, completed concurrent capecitabine and radiation, poor performance status and hence did not get adjuvant chemotherapy, recurrent Klebsiella bacteremia- attributed to intra-abdominal source/presumed septic portal vein thrombus, acute portal vein thrombosis-not on anticoagulation secondary to GI bleed, tobacco abuse & hepatitis C presented to the [facility] ED on 11/14/14 with complaints of progressive mental status changes, abdominal distention/pain and poor appetite. gradually worsening confusion, disorientation and lethargic to a point of near unresponsiveness decreased oral intake of liquids and food but no nausea or vomiting, progressive abdominal and lower extremity swelling with intermittent abdominal pain and no fevers.  Was on IV antibiotics through right upper extremity PICC line and therefore plans to remove PICC line and from IV Unasyn to oral cefuroxime.   on admission somnolent but arousable, afebrile, elevated blood pressure, lab work significant for potassium <2, abnormal LFTs, ammonia 120, hemoglobin 8.5, CT head without acute findings, CT abdomen and pelvis: Diffuse portal vein system thrombosis, anasarca, diffuse moderate to severe bowel wall thickening, moderate bilateral pleural effusions and fluid filled and dilated small bowel loop draining the pancreas. Hospitalist admission was requested. Since admission Patient has had paracentesis twice. Patient continues to develop fluid and requires further tapping. Discussed with radiology possible catheter placement versus [place] shunt. Palliative care has been consulted and pending.  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/hepatitis C -Improved -CT of the head: No acute findings -Blood cultures: no growth to  date -Gastroenterology consulted and appreciated -Ammonia level upon admission 120, now 42 (patient continues to refuse her lactulose enema and dose not want PO)  Anasarca/ascites -Secondary to liver disease, portal vein thrombosis and hypoalbuminemia with malnutrition -Interventional radiology consult appreciated -Patient had ultrasound-guided paracentesis with removal of 3 L (11/15/2014) -Patient empirically started on IV Unasyn to cover for possible SBP (as patient has had klebseilla bacteremia in the 2015) -Spoke with pathology: Fluid cytology shows atypical cells -Fluid culture: no growth to date, no organisms seen -Fluid chemistry not consistent with SBP -SAAG unable to accurately calculate as patient's ascitic albumin was not a specific number, rather <1.   -Patient continues to have a firm abdomen with pain- US showed significant ascites on 11/16/2014 -Repeat US paracentesis on 11/17/2014 removed 3L- however patient's abdomen looks distended and firm -Dr. Maye Hides D/W family 11/17/2014, regarding prognosis and Code status. Palliative care consulted and appreciated -Spoke with radiology, Dr. Laurence Ferrari, who recommended tunneled cath is cytology shows malignancy or [place] shunt if not malignant -await further decision making as per GI and interventional radiology  Previous Klebsiella and streptococcal bacteremia -ID consulted and appreciated -Patient needs to complete 42 day course of antibiotics -ID recommended Cefuroxime 500mg  BID through March 8th when patient discharged- so she no longer needs PICC line -Thus far blood cultures and fluid cultures negative  Severe hypokalemia -Magnesium 1.9 -Resolved, will continue to monitor in place as needed   Dehydration -Secondary to poor oral intake and continue IV fluids  Portal vein thrombosis -Patient  has had history of GI bleed is currently not anticoagulation candidate  Anemia secondary to chronic disease -Baseline approximately  8 -Hemoglobin dropped to 7.0, patient was transfused 1 unit on 2/29, hemoglobin stable between 8-9 range-Continue to monitor CBC  Hepatitis C -Currently not on treatment  Severe protein calorie malnutrition/failure to thrive -Nutrition consulted and appreciated -Patient started on clear liquid diet with nutritional supplementation  Pancreatic adenocarcinoma -S/p Whipple's procedure -Oncology consulted and appreciated  ?Small bowel ileus  Code Status: Full  Family Communication: discussed with husband at bedside  Disposition Plan: patient is currently now DNR/DNI and it is recommended to the patient to pursue a palliative/comfort trajectory. We await further reaccumulation of ascites that placement of shunt/ drain and patient should be able to be discharged home  Time Spent in minutes   30 minutes  Procedures  Ultrasound guided paracentesis 2/25 and 2/27  Consults   Interventional radiology Gastroenterology General surgery Oncology Infectious Disease Palliative care  DVT Prophylaxis  SCDs  Lab Results  Component Value Date   PLT 153 11/21/2014    Medications  Scheduled Meds: . ampicillin-sulbactam (UNASYN) IV  3 g Intravenous 4 times per day  . antiseptic oral rinse  7 mL Mouth Rinse q12n4p  . chlorhexidine  15 mL Mouth Rinse BID  . feeding supplement (RESOURCE BREEZE)  1 Container Oral TID BM  . furosemide  20 mg Oral Daily  . lactulose  300 mL Rectal Once  . lactulose  300 mL Rectal Once  . OxyCODONE  10 mg Oral Q12H  . pantoprazole (PROTONIX) IV  40 mg Intravenous Q24H  . potassium chloride  40 mEq Oral BID  . sodium chloride  3 mL Intravenous Q12H  . spironolactone  50 mg Oral Daily   Continuous Infusions: . sodium chloride 75 mL/hr at 11/19/14 2340   PRN Meds:.acetaminophen, albuterol, bisacodyl, hydrALAZINE, morphine injection, ondansetron (ZOFRAN) IV, oxyCODONE, sodium chloride  Antibiotics    Anti-infectives    Start     Dose/Rate Route  Frequency Ordered Stop   11/15/14 0000  Ampicillin-Sulbactam (UNASYN) 3 g in sodium chloride 0.9 % 100 mL IVPB     3 g 100 mL/hr over 60 Minutes Intravenous 4 times per day 11/14/14 1616     11/14/14 1615  Ampicillin-Sulbactam (UNASYN) 3 g in sodium chloride 0.9 % 100 mL IVPB     3 g 100 mL/hr over 60 Minutes Intravenous STAT 11/14/14 1609 11/14/14 1827   11/14/14 1600  ampicillin-sulbactam (UNASYN) 1.5 g in sodium chloride 0.9 % 50 mL IVPB  Status:  Discontinued     1.5 g 100 mL/hr over 30 Minutes Intravenous  Once 11/14/14 1527 11/14/14 1609        Subjective:   Doing fair, in some discomfort when she passes urine-she has a Foley catheter and she states that she has pain and cries when she passes urine No nausea no vomiting Tolerating clears only    Objective:   Filed Vitals:   11/20/14 1349 11/20/14 2230 11/21/14 0545 11/21/14 1400  BP: 133/82 107/71 117/73 129/81  Pulse: 88 88 71 81  Temp: 97.9 F (36.6 C) 98.2 F (36.8 C) 98.7 F (37.1 C) 96.8 F (36 C)  TempSrc: Oral Oral Oral Oral  Resp: 16 16 16 18   Height:      Weight:   66.9 kg (147 lb 7.8 oz)   SpO2: 100% 100% 99% 100%    Wt Readings from Last 3 Encounters:  11/21/14 66.9 kg (147 lb 7.8  oz)  10/29/14 50.395 kg (111 lb 1.6 oz)  10/17/14 45.6 kg (100 lb 8.5 oz)     Intake/Output Summary (Last 24 hours) at 11/21/14 1738 Last data filed at 11/21/14 1400  Gross per 24 hour  Intake   2700 ml  Output   1150 ml  Net   1550 ml    Exam  General: Well developed, thin  Cardiovascular: S1 S2 auscultated,RRR  Respiratory: Diminished breath sounds  Abdomen: Firm, distended, diffusely tender, hypoactive bowel sounds   Data Review   Micro Results Recent Results (from the past 240 hour(s))  Blood culture (routine x 2)     Status: None   Collection Time: 11/14/14 12:30 PM  Result Value Ref Range Status   Specimen Description BLOOD RIGHT HAND  Final   Special Requests BOTTLES DRAWN AEROBIC ONLY 4CC   Final   Culture   Final    NO GROWTH 5 DAYS Performed at [facility]    Report Status 11/20/2014 FINAL  Final  MRSA PCR Screening     Status: None   Collection Time: 11/14/14  5:15 PM  Result Value Ref Range Status   MRSA by PCR NEGATIVE NEGATIVE Final    Comment:        The GeneXpert MRSA Assay (FDA approved for NASAL specimens only), is one component of a comprehensive MRSA colonization surveillance program. It is not intended to diagnose MRSA infection nor to guide or monitor treatment for MRSA infections.   Body fluid culture     Status: None   Collection Time: 11/15/14 12:10 PM  Result Value Ref Range Status   Specimen Description ASCITIC  Final   Special Requests NONE  Final   Gram Stain   Final    RARE WBC PRESENT, PREDOMINANTLY PMN NO ORGANISMS SEEN Performed at [facility]    Culture   Final    NO GROWTH 3 DAYS Performed at [facility]    Report Status 11/18/2014 FINAL  Final  Anaerobic culture     Status: None   Collection Time: 11/15/14 12:10 PM  Result Value Ref Range Status   Specimen Description FLUID ASCITIES  Final   Special Requests NONE  Final   Gram Stain   Final    RARE WBC PRESENT, PREDOMINANTLY PMN NO ORGANISMS SEEN Performed at [facility]    Culture   Final    NO ANAEROBES ISOLATED Performed at [facility]    Report Status 11/20/2014 FINAL  Final    Radiology Reports Dg Abd 1 View  11/16/2014   CLINICAL DATA:  Abdominal pain and distention.  EXAM: ABDOMEN - 1 VIEW  COMPARISON:  CT on 11/14/2014  FINDINGS: Chronic and bowel loops in the central abdomen is consistent with ascites. Surgical clips and piece of catheter or stent again seen in right upper quadrant. Mild dilatation of small bowel loops and colon is seen in the central abdomen likely due to on adynamic ileus. Some bowel gas in contrast is seen in the rectum.  IMPRESSION: Ascites and probable adynamic ileus.   Electronically Signed    By: Earle Gell M.D.   On: 11/16/2014 16:06   Ct Head Wo Contrast  11/14/2014   CLINICAL DATA:  Altered mental status  EXAM: CT HEAD WITHOUT CONTRAST  TECHNIQUE: Contiguous axial images were obtained from the base of the skull through the vertex without intravenous contrast.  COMPARISON:  October 27, 2005  FINDINGS: The ventricles are normal in size and  configuration. There is no mass, hemorrhage, extra-axial fluid collection, or midline shift. Gray-white compartments are normal. No acute infarct apparent. The bony calvarium appears intact. The mastoid air cells are clear.  IMPRESSION: No intracranial mass, hemorrhage, or focal gray -white compartment lesions/acute appearing infarct.   Electronically Signed   By: Lowella Grip III M.D.   On: 11/14/2014 14:17   US Abdomen Complete  11/14/2014   CLINICAL DATA:  Pancreatic cancer post Whipple procedure 08/07/2014, abdominal swelling for 1 week, prior cholecystectomy, hypertension, radiation therapy, smoker  EXAM: ULTRASOUND ABDOMEN COMPLETE  COMPARISON:  CT abdomen and pelvis 10/16/2014  FINDINGS: Gallbladder: Surgically absent  Common bile duct: Diameter: 5 mm diameter, normal  Liver: Heterogeneous in echogenicity. No discrete mass. Portal vein is not well demonstrated ; portal vein demonstrated thrombosis on the prior CT.  IVC: Intrahepatic portion normal appearance. Infrahepatic portion nonvisualized due to bowel gas.  Pancreas: Post Whipple procedure by history. Pancreatic bed inadequately visualized due to bowel gas.  Spleen: Normal appearance, 5.0 cm length  Right Kidney: Length: 9.3 cm. Normal morphology without mass or hydronephrosis.  Left Kidney: Length: 10.0 cm. Normal morphology without mass or hydronephrosis.  Abdominal aorta: Proximally normal caliber, remainder obscured by bowel gas  Other findings: Significant ascites throughout abdomen and pelvis.  IMPRESSION: Significant ascites significantly increased since prior CT exam, likely cause of  abdominal swelling.  Limited exam due to bowel gas, with inadequate visualization of the pancreatic bed, portal vein and portions of the aorta.  Post cholecystectomy.   Electronically Signed   By: Lavonia Dana M.D.   On: 11/14/2014 14:32   Ct Abdomen Pelvis W Contrast  11/14/2014   CLINICAL DATA:  64 year old female with abdominal pain and distension. Current history of pancreatic cancer status post Whipple and radiation. Subsequent encounter.  EXAM: CT ABDOMEN AND PELVIS WITH CONTRAST  TECHNIQUE: Multidetector CT imaging of the abdomen and pelvis was performed using the standard protocol following bolus administration of intravenous contrast.  CONTRAST:  122mL OMNIPAQUE IOHEXOL 300 MG/ML  SOLN  COMPARISON:  10/16/2014 and earlier.  FINDINGS: New moderate size bilateral pleural effusions with lower lobe atelectasis. No pericardial effusion.  Advanced degenerative changes in the lower lumbar spine. No acute osseous abnormality identified.  Increased body wall stranding and density in keeping with a degree of anasarca. Moderate to large volume of ascites in the abdomen has significantly progressed, and in the upper abdomen this is associated with new lobular mass effect on the liver contour (series 2, image 17).  No dilated large bowel. Widespread small and large bowel wall thickening. Pancreatic or biliary stent re- identified within a small bowel loop in the right upper quadrant which today is dilated with fluid up to 4 cm diameter. There is continued intra and extrahepatic biliary ductal dilatation. Pancreatic tail atrophy again noted.  In January of the main portal vein was thrombosed. On today images no portal venous system enhancement is identified.  The stomach is thick walled. Oral contrast was administered, and has reached mid small bowel loops.  Major arterial structures in the abdomen and pelvis appear patent. Aortoiliac calcified atherosclerosis noted. Renal enhancement and contrast excretion is within  normal limits.  IMPRESSION: 1. Progression of disease. Progressed and now diffuse portal venous system thrombosis since January. 2. Anasarca. Large volume ascites, mass effect on the liver may reflect subcapsular or loculated ascites. Diffuse moderate to severe bowel wall thickening. Moderate bilateral pleural effusions. 3. Fluid-filled and dilated small bowel loop draining the pancreas.  Electronically Signed   By: Genevie Ann M.D.   On: 11/14/2014 14:42   US Abdomen Limited  11/16/2014   CLINICAL DATA:  Abdominal distention question ascites, history Whipple procedure, hypertension, pancreatic cancer  EXAM: LIMITED ABDOMEN ULTRASOUND FOR ASCITES  TECHNIQUE: Limited ultrasound survey for ascites was performed in all four abdominal quadrants.  COMPARISON:  11/14/2014 CT abdomen and pelvis  FINDINGS: Significant ascites is identified in both lower quadrants.  Smaller amount of ascites is seen in the LEFT upper quadrant, with even less in the perihepatic region.  IMPRESSION: Significant ascites.   Electronically Signed   By: Lavonia Dana M.D.   On: 11/16/2014 17:34   US Paracentesis  11/20/2014   INDICATION: Pancreatic cancer, hepatitis-C, ascites. Request is made for diagnostic and therapeutic paracentesis.  EXAM: ULTRASOUND-GUIDED DIAGNOSTIC AND THERAPEUTIC PARACENTESIS  COMPARISON:  Prior paracentesis on 11/17/2014  MEDICATIONS: None.  COMPLICATIONS: None immediate  TECHNIQUE: Informed written consent was obtained from the patient after a discussion of the risks, benefits and alternatives to treatment. A timeout was performed prior to the initiation of the procedure.  Initial ultrasound scanning demonstrates a small amount of ascites within the right lower abdominal quadrant. The right lower abdomen was prepped and draped in the usual sterile fashion. 1% lidocaine was used for local anesthesia. Under direct ultrasound guidance, a 19 gauge, 7-cm, Yueh catheter was introduced. An ultrasound image was saved for  documentation purposed. The paracentesis was performed. The catheter was removed and a dressing was applied. The patient tolerated the procedure well without immediate post procedural complication.  FINDINGS: A total of approximately 1.7 liters of clear, yellow fluid was removed. Samples were sent to the laboratory as requested by the clinical team.  IMPRESSION: Successful ultrasound-guided diagnostic and therapeutic paracentesis yielding 1.7 liters of peritoneal fluid.  Read by: Rowe Robert, PA-C   Electronically Signed   By: Jerilynn Mages.  Shick M.D.   On: 11/20/2014 10:53   US Paracentesis  11/17/2014   INDICATION: ascites  CLINICAL DATA:  Request for therapeutic paracentesis  EXAM: ULTRASOUND-GUIDED PARACENTESIS  ULTRASOUND GUIDED RLQ PARACENTESIS  COMPARISON:  None.  MEDICATIONS: 10 cc 1% lidocaine  COMPLICATIONS: None immediate  TECHNIQUE: Informed written consent was obtained from the patient after a discussion of the risks, benefits and alternatives to treatment. A timeout was performed prior to the initiation of the procedure.  Initial ultrasound scanning demonstrates a large amount of ascites within the right lower abdominal quadrant. The right lower abdomen was prepped and draped in the usual sterile fashion. 1% lidocaine with epinephrine was used for local anesthesia. Under direct ultrasound guidance, a 19 gauge, 7-cm, Yueh catheter was introduced. An ultrasound image was saved for documentation purposed. the paracentesis was performed. The catheter was removed and a dressing was applied. The patient tolerated the procedure well without immediate post procedural complication.  FINDINGS: A total of approximately 3 liters of serous fluid was removed.  IMPRESSION: Successful ultrasound-guided paracentesis yielding 3 liters of peritoneal fluid.  PROCEDURE: Ultrasound was performed to localize and mark an adequate pocket of fluid in the right quadrant of the abdomen. The area was then prepped and draped in the  normal sterile fashion. 1% Lidocaine was used for local anesthesia. Under ultrasound guidance a 19 gauge Yueh catheter was introduced. Paracentesis was performed. The catheter was removed and a dressing applied.  Read by:  Lavonia Drafts [facility]   Electronically Signed   By: Jacqulynn Cadet M.D.   On: 11/17/2014 11:31   US Paracentesis  11/15/2014   CLINICAL DATA:  Ascites, history of pancreatic cancer. Request therapeutic and diagnostic paracentesis of up to 3 L max.  EXAM: ULTRASOUND GUIDED PARACENTESIS  COMPARISON:  None.  PROCEDURE: An ultrasound guided paracentesis was thoroughly discussed with the patient and questions answered. The benefits, risks, alternatives and complications were also discussed. The patient understands and wishes to proceed with the procedure. Written consent was obtained.  Ultrasound was performed to localize and mark an adequate pocket of fluid in the left lower quadrant of the abdomen. The area was then prepped and draped in the normal sterile fashion. 1% Lidocaine was used for local anesthesia. Under ultrasound guidance a 19 gauge Yueh catheter was introduced. Paracentesis was performed. The catheter was removed and a dressing applied.  COMPLICATIONS: None immediate  FINDINGS: A total of approximately 3 L of clear yellow fluid was removed. A fluid sample was sent for laboratory analysis.  IMPRESSION: Successful ultrasound guided paracentesis yielding 3 L of ascites.  Read by: Ascencion Dike PA-C   Electronically Signed   By: Corrie Mckusick D.O.   On: 11/15/2014 12:55    CBC  Recent Labs Lab 11/17/14 0505 11/18/14 0545 11/19/14 0520 11/20/14 0500 11/21/14 0530  WBC 4.5 5.7 4.1 5.4 4.9  HGB 7.9* 7.6* 7.0* 9.2* 8.5*  HCT 23.7* 23.2* 21.8* 27.0* 25.5*  PLT 242 219 192 196 153  MCV 85.3 85.9 86.5 86.8 89.2  MCH 28.4 28.1 27.8 29.6 29.7  MCHC 33.3 32.8 32.1 34.1 33.3  RDW 22.9* 23.9* 24.3* 22.8* 23.3*    Chemistries   Recent Labs Lab 11/17/14 0505 11/18/14 0545  11/19/14 0520 11/20/14 0500 11/21/14 0530  NA 136 137 138 139 139  K 3.6 3.1* 3.3* 4.1 4.4  CL 114* 113* 114* 115* 118*  CO2 20 20 20 19 19   GLUCOSE 110* 93 93 80 93  BUN 9 7 6  5* <5*  CREATININE 0.49* 0.33* <0.30* <0.30* <0.30*  CALCIUM 7.2* 7.5* 7.8* 7.9* 7.6*  AST 117* 100* 70* 64* 59*  ALT 56* 50* 37* 35 32  ALKPHOS 965* 905* 671* 633* 541*  BILITOT 6.1* 6.3* 8.4* 8.7* 8.0*   ------------------------------------------------------------------------------------------------------------------ CrCl cannot be calculated (Patient has no serum creatinine result on file.). ------------------------------------------------------------------------------------------------------------------ No results for input(s): HGBA1C in the last 72 hours. ------------------------------------------------------------------------------------------------------------------ No results for input(s): CHOL, HDL, LDLCALC, TRIG, CHOLHDL, LDLDIRECT in the last 72 hours. ------------------------------------------------------------------------------------------------------------------ No results for input(s): TSH, T4TOTAL, T3FREE, THYROIDAB in the last 72 hours.  Invalid input(s): FREET3 ------------------------------------------------------------------------------------------------------------------ No results for input(s): VITAMINB12, FOLATE, FERRITIN, TIBC, IRON, RETICCTPCT in the last 72 hours.  Coagulation profile No results for input(s): INR, PROTIME in the last 168 hours.  No results for input(s): DDIMER in the last 72 hours.  Cardiac Enzymes No results for input(s): CKMB, TROPONINI, MYOGLOBIN in the last 168 hours.  Invalid input(s): CK ------------------------------------------------------------------------------------------------------------------ Invalid input(s): POCBNP  Verneita Griffes, MD [facility] [phone]

## 2014-11-21 NOTE — Progress Notes (Signed)
Occupational Therapy Treatment Patient Details Name: Melinda Conrad MRN: [id number] DOB: 03/27/1951 Today's Date: 11/21/2014    History of present illness 64 yo female admitted 11/14/14 with Altered mental status, abdominal distention and pain and poor appetite, patient has h/o pancreatic cancer, portal vein thrombosis,    OT comments  Limited participation  Follow Up Recommendations  SNF    Equipment Recommendations  None recommended by OT       Precautions / Restrictions Precautions Precautions: Fall Precaution Comments: abdominal surgery Restrictions Weight Bearing Restrictions: No       Mobility Bed Mobility Overal bed mobility: Needs Assistance Bed Mobility: Rolling Rolling: Mod assist         General bed mobility comments: declined sitting  Transfers                          ADL Overall ADL's : Needs assistance/impaired     Grooming: Wash/dry face;Bed level                                 General ADL Comments: pt refused OOB- but did agreee to grooming and BUE exercise.  Pt will need significant A at home or SNF.  Spoke with RN                Cognition   Behavior During Therapy: [facility] for tasks assessed/performed Overall Cognitive Status: Within Functional Limits for tasks assessed                         Exercises        General Comments  Pt did agree to minimal BUE exercise- 5 reps each UE.     Pertinent Vitals/ Pain       Pain Score: 7  Faces Pain Scale: Hurts even more Pain Location: belly Pain Descriptors / Indicators: Sore Pain Intervention(s): Limited activity within patient's tolerance;Monitored during session;Repositioned;Patient requesting pain meds-RN notified  Home Living                                          Prior Functioning/Environment              Frequency Min 2X/week     Progress Toward Goals  OT Goals(current goals can now be found in the care plan  section)  Progress towards OT goals: OT to reassess next treatment     Plan Discharge plan needs to be updated    Co-evaluation                 End of Session     Activity Tolerance Patient limited by pain   Patient Left in bed;with call bell/phone within reach   Nurse Communication Mobility status        Time: 7371-0626 OT Time Calculation (min): 13 min  Charges: OT General Charges $OT Visit: 1 Procedure OT Treatments $Self Care/Home Management : 8-22 mins  Manasvini Whatley D 11/21/2014, 11:30 AM

## 2014-11-21 NOTE — Progress Notes (Signed)
Progress Note from the Palliative Medicine Team at [facility]:   -f/u yesterday's [facility] meeting, patient is comfortable with her decision for comfort and hope for dc home with hospice after pluerex is placed for refractory ascites  -patient tells me her pain "is better" today    Objective: No Known Allergies Scheduled Meds: . ampicillin-sulbactam (UNASYN) IV  3 g Intravenous 4 times per day  . antiseptic oral rinse  7 mL Mouth Rinse q12n4p  . chlorhexidine  15 mL Mouth Rinse BID  . feeding supplement (RESOURCE BREEZE)  1 Container Oral TID BM  . lactulose  300 mL Rectal Once  . lactulose  300 mL Rectal Once  . OxyCODONE  10 mg Oral Q12H  . pantoprazole (PROTONIX) IV  40 mg Intravenous Q24H  . potassium chloride  40 mEq Oral BID  . sodium chloride  3 mL Intravenous Q12H   Continuous Infusions: . sodium chloride 75 mL/hr at 11/19/14 2340   PRN Meds:.acetaminophen, albuterol, bisacodyl, hydrALAZINE, morphine injection, ondansetron (ZOFRAN) IV, oxyCODONE, sodium chloride  BP 117/73 mmHg  Pulse 71  Temp(Src) 98.7 F (37.1 C) (Oral)  Resp 16  Ht 5\' 3"  (1.6 m)  Wt 66.9 kg (147 lb 7.8 oz)  BMI 26.13 kg/m2  SpO2 99%   PPS:30 % at best     Intake/Output Summary (Last 24 hours) at 11/21/14 0933 Last data filed at 11/21/14 0616  Gross per 24 hour  Intake 2877.5 ml  Output    850 ml  Net 2027.5 ml       Physical Exam:  General: chronically ill appearing  NAD HEENT:  Moist buccal  Membranes, no exudate Chest:   Decreased in bases CVS: RRR Abdomen: distended, firm and tender Ext: BLE +2 edema  Labs: CBC    Component Value Date/Time   WBC 4.9 11/21/2014 0530   WBC 4.2 06/22/2014 0935   RBC 2.86* 11/21/2014 0530   RBC 4.37 06/22/2014 0935   HGB 8.5* 11/21/2014 0530   HGB 13.7 06/22/2014 0935   HCT 25.5* 11/21/2014 0530   HCT 40.1 06/22/2014 0935   PLT 153 11/21/2014 0530   PLT 168 06/22/2014 0935   MCV 89.2 11/21/2014 0530   MCV 91.8 06/22/2014 0935    MCH 29.7 11/21/2014 0530   MCH 31.4 06/22/2014 0935   MCHC 33.3 11/21/2014 0530   MCHC 34.2 06/22/2014 0935   RDW 23.3* 11/21/2014 0530   RDW 15.2* 06/22/2014 0935   LYMPHSABS 0.6* 10/16/2014 1339   LYMPHSABS 0.5* 06/22/2014 0935   MONOABS 0.6 10/16/2014 1339   MONOABS 0.4 06/22/2014 0935   EOSABS 0.0 10/16/2014 1339   EOSABS 0.1 06/22/2014 0935   BASOSABS 0.0 10/16/2014 1339   BASOSABS 0.0 06/22/2014 0935    BMET    Component Value Date/Time   NA 139 11/21/2014 0530   NA 141 06/22/2014 0935   K 4.4 11/21/2014 0530   K 4.1 06/22/2014 0935   CL 118* 11/21/2014 0530   CO2 19 11/21/2014 0530   CO2 22 06/22/2014 0935   GLUCOSE 93 11/21/2014 0530   GLUCOSE 79 06/22/2014 0935   BUN <5* 11/21/2014 0530   BUN 10.7 06/22/2014 0935   CREATININE <0.30* 11/21/2014 0530   CREATININE 0.9 06/22/2014 0935   CALCIUM 7.6* 11/21/2014 0530   CALCIUM 9.6 06/22/2014 0935   GFRNONAA NOT CALCULATED 11/21/2014 0530   GFRAA NOT CALCULATED 11/21/2014 0530    CMP     Component Value Date/Time   NA 139 11/21/2014 0530  NA 141 06/22/2014 0935   K 4.4 11/21/2014 0530   K 4.1 06/22/2014 0935   CL 118* 11/21/2014 0530   CO2 19 11/21/2014 0530   CO2 22 06/22/2014 0935   GLUCOSE 93 11/21/2014 0530   GLUCOSE 79 06/22/2014 0935   BUN <5* 11/21/2014 0530   BUN 10.7 06/22/2014 0935   CREATININE <0.30* 11/21/2014 0530   CREATININE 0.9 06/22/2014 0935   CALCIUM 7.6* 11/21/2014 0530   CALCIUM 9.6 06/22/2014 0935   PROT 6.1 11/21/2014 0530   PROT 8.7* 06/08/2014 1013   ALBUMIN 1.4* 11/21/2014 0530   ALBUMIN 3.4* 06/08/2014 1013   AST 59* 11/21/2014 0530   AST 24 06/08/2014 1013   ALT 32 11/21/2014 0530   ALT 15 06/08/2014 1013   ALKPHOS 541* 11/21/2014 0530   ALKPHOS 89 06/08/2014 1013   BILITOT 8.0* 11/21/2014 0530   BILITOT 0.47 06/08/2014 1013   GFRNONAA NOT CALCULATED 11/21/2014 0530   GFRAA NOT CALCULATED 11/21/2014 0530      Assessment and Plan: 1. Code Status: DNR/DNI  -comfort is main focus of care 2. Symptom Control:        Continue: Morphine Er 10 mg every 12 hr  Morphine IR 5 mg every 3 hrs prn 3. Psycho/Social:  Emotional support offered to patient at bedside 4. Spiritual  Chaplain involved 5. Disposition:  Home with hospice services after tunneled cath for refractory ascites     Time In Time Out Total Time Spent with Patient Total Overall Time  0800 0825 25 min 25 min    Greater than 50%  of this time was spent counseling and coordinating care related to the above assessment and plan.  Wadie Lessen NP  Palliative Medicine Team Team Phone # [phone] Pager [phone]  Discussed with Dr Verlon Au 1

## 2014-11-22 DIAGNOSIS — R140 Abdominal distension (gaseous): Secondary | ICD-10-CM

## 2014-11-22 LAB — COMPREHENSIVE METABOLIC PANEL
ALBUMIN: 1.4 g/dL — AB (ref 3.5–5.2)
ALT: 29 U/L (ref 0–35)
AST: 54 U/L — AB (ref 0–37)
Alkaline Phosphatase: 541 U/L — ABNORMAL HIGH (ref 39–117)
Anion gap: 3 — ABNORMAL LOW (ref 5–15)
BILIRUBIN TOTAL: 8 mg/dL — AB (ref 0.3–1.2)
BUN: 5 mg/dL — ABNORMAL LOW (ref 6–23)
CO2: 21 mmol/L (ref 19–32)
CREATININE: 0.44 mg/dL — AB (ref 0.50–1.10)
Calcium: 7.8 mg/dL — ABNORMAL LOW (ref 8.4–10.5)
Chloride: 112 mmol/L (ref 96–112)
GFR calc Af Amer: >90 mL/min (ref >90)
GFR calc non Af Amer: >90 mL/min (ref >90)
Glucose, Bld: 85 mg/dL (ref 70–99)
Potassium: 4.4 mmol/L (ref 3.5–5.1)
SODIUM: 136 mmol/L (ref 135–145)
Total Protein: 6.6 g/dL (ref 6.0–8.3)

## 2014-11-22 LAB — CBC WITH DIFFERENTIAL/PLATELET
BASOS ABS: 0 10*3/uL (ref 0.0–0.1)
Basophils Relative: 0 % (ref 0–1)
Eosinophils Absolute: 0.2 10*3/uL (ref 0.0–0.7)
Eosinophils Relative: 3 % (ref 0–5)
HEMATOCRIT: 26.9 % — AB (ref 36.0–46.0)
HEMOGLOBIN: 8.8 g/dL — AB (ref 12.0–15.0)
Lymphocytes Relative: 16 % (ref 12–46)
Lymphs Abs: 0.8 10*3/uL (ref 0.7–4.0)
MCH: 29.4 pg (ref 26.0–34.0)
MCHC: 32.7 g/dL (ref 30.0–36.0)
MCV: 90 fL (ref 78.0–100.0)
MONOS PCT: 12 % (ref 3–12)
Monocytes Absolute: 0.6 10*3/uL (ref 0.1–1.0)
NEUTROS ABS: 3.4 10*3/uL (ref 1.7–7.7)
NEUTROS PCT: 69 % (ref 43–77)
Platelets: 167 10*3/uL (ref 150–400)
RBC: 2.99 MIL/uL — ABNORMAL LOW (ref 3.87–5.11)
RDW: 23.8 % — ABNORMAL HIGH (ref 11.5–15.5)
WBC: 5 10*3/uL (ref 4.0–10.5)

## 2014-11-22 MED ORDER — SPIRONOLACTONE 100 MG PO TABS
100.0000 mg | ORAL_TABLET | Freq: Every day | ORAL | Status: DC
  Administered 2014-11-22 – 2014-11-26 (×5): 100 mg via ORAL
  Filled 2014-11-22 (×6): qty 1

## 2014-11-22 MED ORDER — FUROSEMIDE 40 MG PO TABS
40.0000 mg | ORAL_TABLET | Freq: Every day | ORAL | Status: DC
  Administered 2014-11-22 – 2014-11-26 (×5): 40 mg via ORAL
  Filled 2014-11-22 (×5): qty 1

## 2014-11-22 MED ORDER — OXYCODONE HCL ER 15 MG PO T12A
15.0000 mg | EXTENDED_RELEASE_TABLET | Freq: Two times a day (BID) | ORAL | Status: DC
  Administered 2014-11-22 – 2014-11-28 (×11): 15 mg via ORAL
  Filled 2014-11-22 (×14): qty 1

## 2014-11-22 MED ORDER — AMOXICILLIN-POT CLAVULANATE 875-125 MG PO TABS
1.0000 | ORAL_TABLET | Freq: Two times a day (BID) | ORAL | Status: DC
  Administered 2014-11-22 – 2014-11-25 (×6): 1 via ORAL
  Filled 2014-11-22 (×8): qty 1

## 2014-11-22 NOTE — Progress Notes (Signed)
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 Patient has been receiving Unasyn through PICC line, but per ID note 2/23 was to changed to cefuroxime to complete therapy for polymicrobial bacteremia.  Pharmacy consulted today to dose Unasyn for r/o SBP, recurrent Klebsiella bacteremia. ID is following  Antiinfectives  PTA, started 1/31 >> Unasyn >> 3/8 Currently on day 37 of 42 days of therapy for polymicrobial bacteremia per ID with plan to change abx's to Ceftin when discharged and continue thru 3/8  Labs / vitals Tmax: remains afebrile WBCs: now remains WNL Renal: SCr stable (appears at baseline, ? Slightly increased), CrCl ~61, 84N  Microbiology 1/26 blood: Klebsiella pneumoniae, Streptococcus species 2/24 blood x 2: NGTD 2/24 MRSA PCR: negative 2/25 ascites fluid: ngtd  Goal of Therapy:  Appropriate antibiotic dosing for renal function and indication Eradication of infection  Plan:  Day #9 unasyn  Continue Unasyn 3 g IV q6h and plan as per above when discharged  Monitor renal function, cultures, clinical course.  No dose adjustment needed  Doreene Eland, PharmD, BCPS.   Pager: [phone] 11/22/2014 1:25 PM

## 2014-11-22 NOTE — Progress Notes (Signed)
[facility]                                                                              64 y.o.? adenocarcinoma of head of pancreas, status post Whipple's procedure 08/07/14, completed concurrent capecitabine and radiation, poor performance status and hence did not get adjuvant chemotherapy, recurrent Klebsiella bacteremia- attributed to intra-abdominal source/presumed septic portal vein thrombus, acute portal vein thrombosis-not on anticoagulation secondary to GI bleed, tobacco abuse & hepatitis C presented to the [facility] ED on 11/14/14 with complaints of progressive mental status changes, abdominal distention/pain and poor appetite. gradually worsening confusion, disorientation and lethargic to a point of near unresponsiveness decreased oral intake of liquids and food but no nausea or vomiting, progressive abdominal and lower extremity swelling with intermittent abdominal pain and no fevers.  Was on IV antibiotics through right upper extremity PICC line and therefore plans to remove PICC line and from IV Unasyn to oral cefuroxime.   on admission somnolent but arousable, afebrile, elevated blood pressure, lab work significant for potassium <2, abnormal LFTs, ammonia 120, hemoglobin 8.5, CT head without acute findings, CT abdomen and pelvis: Diffuse portal vein system thrombosis, anasarca, diffuse moderate to severe bowel wall thickening, moderate bilateral pleural effusions and fluid filled and dilated small bowel loop draining the pancreas. Hospitalist admission was requested. Since admission Patient has had paracentesis twice. Patient continues to develop fluid and requires further tapping. Discussed with radiology possible catheter placement versus [place] shunt. Palliative care has been consulted and pending.  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 -CT of the head: No acute findings -Blood cultures: no growth  to date -Gastroenterology consulted and appreciated -Ammonia level upon admission 120, now 92.  Anasarca/ascites -Secondary to liver disease, portal vein thrombosis and hypoalbuminemia with malnutrition -Interventional radiology consult appreciated -Patient had ultrasound-guided paracentesis with removal of 3 L (11/15/2014) -Patient empirically started on IV Unasyn 2/24 which was tapered on 3/3 2 Augmentin to cover for possible SBP-stop date 11/25/14 -Spoke with pathology: Fluid cytology shows atypical cells--they will stain the atypical cells tomorrow and let me know if there is any risk or concern for malignancy -Fluid culture: no growth to date, no organisms seen -Fluid chemistry not consistent with SBP -SAAG unable to accurately calculate as patient's ascitic albumin was not a specific number, rather <1.   -Patient continues to have a firm abdomen with pain- US showed significant ascites on 11/16/2014 -Repeat US paracentesis on 11/17/2014 removed 3L- however patient's abdomen looks distended and firm -Dr. Maye Hides D/W family 11/17/2014, regarding prognosis and Code status. Palliative care consulted and appreciated -Long discussion with family as well as with patient and with palliative care as well as with interventional radiology totaling >35 minutes of time Patient is not sure what she wants . I have spent a long time with the daughter and the patient discussing the risks and benefits of indwelling Pleurx drain which will offer comfort if that is what she so desires   patient was also offered the option of diuretic therapy for her ascites and intermittent paracentesis She tells me she is not sure what she wants to do and  at this rate I do not think she is hospice eligible just yet--I did intubate hospice and palliative care [facility] as to her complicated and difficult disposition I have asked the patient and her family to think about all of the options that have been presented to the and we will  make a decision together within the next 24-48 hours  For now-strict I's and O's, keep indwelling Foley catheter, daily weights I will obtain a urinary sodium/potassium ratio in about 2-3 days to determine ADH blockade and we can then decide on either upping her diuretic versus paracentesis  Previous Klebsiella and streptococcal bacteremia -ID consulted and appreciated -Patient needs to complete 42 day course of antibiotics -ID recommended Cefuroxime 500mg  BID through March 8th when patient discharged- so she no longer needs PICC line -I will revisit this issue in a.m. as patient is already on Unasyn/Augmentin  -Thus far blood cultures and fluid cultures negative  Severe hypokalemia -Magnesium 1.9 -Resolved, will continue to monitor in place as needed   Dehydration -Secondary to poor oral intake and continue IV fluids  Portal vein thrombosis -Patient has had history of GI bleed is currently not anticoagulation candidate  Anemia secondary to chronic disease -Baseline approximately 8 -Hemoglobin dropped to 7.0, patient was transfused 1 unit on 2/29, hemoglobin stable between 8-9 range-Continue to monitor CBC  Hepatitis C -Currently not on treatment  Severe protein calorie malnutrition/failure to thrive -Nutrition consulted and appreciated -Patient started on clear liquid diet with nutritional supplementation  Pancreatic adenocarcinoma -S/p Whipple's procedure  ?Small bowel ileus  resolved and improved-  Code Status: Full  Family Communication: discussed with husband at bedside  Disposition Plan: patient is currently now DNR/DNI and it is recommended to the patient to pursue a palliative care trajectory   see above note  Time Spent in minutes   55 minutes  Procedures  Ultrasound guided paracentesis 2/25 and 2/27  Consults   Interventional radiology Gastroenterology General surgery Oncology Infectious Disease Palliative care  DVT Prophylaxis  SCDs  Lab Results   Component Value Date   PLT 167 11/22/2014    Medications  Scheduled Meds: . ampicillin-sulbactam (UNASYN) IV  3 g Intravenous 4 times per day  . antiseptic oral rinse  7 mL Mouth Rinse q12n4p  . chlorhexidine  15 mL Mouth Rinse BID  . feeding supplement (RESOURCE BREEZE)  1 Container Oral TID BM  . furosemide  40 mg Oral Daily  . lactulose  300 mL Rectal Once  . lactulose  300 mL Rectal Once  . OxyCODONE  15 mg Oral Q12H  . pantoprazole (PROTONIX) IV  40 mg Intravenous Q24H  . potassium chloride  40 mEq Oral BID  . sodium chloride  3 mL Intravenous Q12H  . spironolactone  100 mg Oral Daily   Continuous Infusions: . sodium chloride 75 mL/hr at 11/22/14 0103   PRN Meds:.acetaminophen, albuterol, bisacodyl, hydrALAZINE, morphine injection, ondansetron (ZOFRAN) IV, oxyCODONE, sodium chloride  Antibiotics    Anti-infectives    Start     Dose/Rate Route Frequency Ordered Stop   11/22/14 2200  amoxicillin-clavulanate (AUGMENTIN) 875-125 MG per tablet 1 tablet     1 tablet Oral Every 12 hours 11/22/14 1859     11/15/14 0000  Ampicillin-Sulbactam (UNASYN) 3 g in sodium chloride 0.9 % 100 mL IVPB  Status:  Discontinued     3 g 100 mL/hr over 60 Minutes Intravenous 4 times per day 11/14/14 1616 11/22/14 1859   11/14/14 1615  Ampicillin-Sulbactam (UNASYN) 3 g  in sodium chloride 0.9 % 100 mL IVPB     3 g 100 mL/hr over 60 Minutes Intravenous STAT 11/14/14 1609 11/14/14 1827   11/14/14 1600  ampicillin-sulbactam (UNASYN) 1.5 g in sodium chloride 0.9 % 50 mL IVPB  Status:  Discontinued     1.5 g 100 mL/hr over 30 Minutes Intravenous  Once 11/14/14 1527 11/14/14 1609        Subjective:   Doing okay Understands the gravity of her situation   understands it may be more cirrhosis related> cancer related Wants some time to think things through  Objective:   Filed Vitals:   11/22/14 0500 11/22/14 0606 11/22/14 0837 11/22/14 1501  BP:  104/77 110/66 102/62  Pulse:  69 63 58    Temp:  97.8 F (36.6 C) 97.8 F (36.6 C) 97.8 F (36.6 C)  TempSrc:  Oral Oral Oral  Resp:  16 16 16   Height:      Weight: 62.653 kg (138 lb 2 oz)     SpO2:   100% 100%    Wt Readings from Last 3 Encounters:  11/22/14 62.653 kg (138 lb 2 oz)  10/29/14 50.395 kg (111 lb 1.6 oz)  10/17/14 45.6 kg (100 lb 8.5 oz)     Intake/Output Summary (Last 24 hours) at 11/22/14 1857 Last data filed at 11/22/14 1815  Gross per 24 hour  Intake   2700 ml  Output   2125 ml  Net    575 ml    Exam  General: Well developed, thin, cachectic , temporalis wasting, supraclavicular wasting   Cardiovascular: S1 S2 auscultated,RRR  Respiratory: Diminished breath sounds  Abdomen: Firm, distended, diffusely tender, hypoactive bowel sounds   lower extremities are swollen    Data Review   Micro Results Recent Results (from the past 240 hour(s))  Blood culture (routine x 2)     Status: None   Collection Time: 11/14/14 12:30 PM  Result Value Ref Range Status   Specimen Description BLOOD RIGHT HAND  Final   Special Requests BOTTLES DRAWN AEROBIC ONLY 4CC  Final   Culture   Final    NO GROWTH 5 DAYS Performed at [facility]    Report Status 11/20/2014 FINAL  Final  MRSA PCR Screening     Status: None   Collection Time: 11/14/14  5:15 PM  Result Value Ref Range Status   MRSA by PCR NEGATIVE NEGATIVE Final    Comment:        The GeneXpert MRSA Assay (FDA approved for NASAL specimens only), is one component of a comprehensive MRSA colonization surveillance program. It is not intended to diagnose MRSA infection nor to guide or monitor treatment for MRSA infections.   Body fluid culture     Status: None   Collection Time: 11/15/14 12:10 PM  Result Value Ref Range Status   Specimen Description ASCITIC  Final   Special Requests NONE  Final   Gram Stain   Final    RARE WBC PRESENT, PREDOMINANTLY PMN NO ORGANISMS SEEN Performed at [facility]    Culture   Final     NO GROWTH 3 DAYS Performed at [facility]    Report Status 11/18/2014 FINAL  Final  Anaerobic culture     Status: None   Collection Time: 11/15/14 12:10 PM  Result Value Ref Range Status   Specimen Description FLUID ASCITIES  Final   Special Requests NONE  Final   Gram Stain   Final  RARE WBC PRESENT, PREDOMINANTLY PMN NO ORGANISMS SEEN Performed at [facility]    Culture   Final    NO ANAEROBES ISOLATED Performed at [facility]    Report Status 11/20/2014 FINAL  Final    Radiology Reports Dg Abd 1 View  11/16/2014   CLINICAL DATA:  Abdominal pain and distention.  EXAM: ABDOMEN - 1 VIEW  COMPARISON:  CT on 11/14/2014  FINDINGS: Chronic and bowel loops in the central abdomen is consistent with ascites. Surgical clips and piece of catheter or stent again seen in right upper quadrant. Mild dilatation of small bowel loops and colon is seen in the central abdomen likely due to on adynamic ileus. Some bowel gas in contrast is seen in the rectum.  IMPRESSION: Ascites and probable adynamic ileus.   Electronically Signed   By: Earle Gell M.D.   On: 11/16/2014 16:06   Ct Head Wo Contrast  11/14/2014   CLINICAL DATA:  Altered mental status  EXAM: CT HEAD WITHOUT CONTRAST  TECHNIQUE: Contiguous axial images were obtained from the base of the skull through the vertex without intravenous contrast.  COMPARISON:  October 27, 2005  FINDINGS: The ventricles are normal in size and configuration. There is no mass, hemorrhage, extra-axial fluid collection, or midline shift. Gray-white compartments are normal. No acute infarct apparent. The bony calvarium appears intact. The mastoid air cells are clear.  IMPRESSION: No intracranial mass, hemorrhage, or focal gray -white compartment lesions/acute appearing infarct.   Electronically Signed   By: Lowella Grip III M.D.   On: 11/14/2014 14:17   US Abdomen Complete  11/14/2014   CLINICAL DATA:  Pancreatic cancer post Whipple  procedure 08/07/2014, abdominal swelling for 1 week, prior cholecystectomy, hypertension, radiation therapy, smoker  EXAM: ULTRASOUND ABDOMEN COMPLETE  COMPARISON:  CT abdomen and pelvis 10/16/2014  FINDINGS: Gallbladder: Surgically absent  Common bile duct: Diameter: 5 mm diameter, normal  Liver: Heterogeneous in echogenicity. No discrete mass. Portal vein is not well demonstrated ; portal vein demonstrated thrombosis on the prior CT.  IVC: Intrahepatic portion normal appearance. Infrahepatic portion nonvisualized due to bowel gas.  Pancreas: Post Whipple procedure by history. Pancreatic bed inadequately visualized due to bowel gas.  Spleen: Normal appearance, 5.0 cm length  Right Kidney: Length: 9.3 cm. Normal morphology without mass or hydronephrosis.  Left Kidney: Length: 10.0 cm. Normal morphology without mass or hydronephrosis.  Abdominal aorta: Proximally normal caliber, remainder obscured by bowel gas  Other findings: Significant ascites throughout abdomen and pelvis.  IMPRESSION: Significant ascites significantly increased since prior CT exam, likely cause of abdominal swelling.  Limited exam due to bowel gas, with inadequate visualization of the pancreatic bed, portal vein and portions of the aorta.  Post cholecystectomy.   Electronically Signed   By: Lavonia Dana M.D.   On: 11/14/2014 14:32   Ct Abdomen Pelvis W Contrast  11/14/2014   CLINICAL DATA:  64 year old female with abdominal pain and distension. Current history of pancreatic cancer status post Whipple and radiation. Subsequent encounter.  EXAM: CT ABDOMEN AND PELVIS WITH CONTRAST  TECHNIQUE: Multidetector CT imaging of the abdomen and pelvis was performed using the standard protocol following bolus administration of intravenous contrast.  CONTRAST:  147mL OMNIPAQUE IOHEXOL 300 MG/ML  SOLN  COMPARISON:  10/16/2014 and earlier.  FINDINGS: New moderate size bilateral pleural effusions with lower lobe atelectasis. No pericardial effusion.  Advanced  degenerative changes in the lower lumbar spine. No acute osseous abnormality identified.  Increased body wall stranding and  density in keeping with a degree of anasarca. Moderate to large volume of ascites in the abdomen has significantly progressed, and in the upper abdomen this is associated with new lobular mass effect on the liver contour (series 2, image 17).  No dilated large bowel. Widespread small and large bowel wall thickening. Pancreatic or biliary stent re- identified within a small bowel loop in the right upper quadrant which today is dilated with fluid up to 4 cm diameter. There is continued intra and extrahepatic biliary ductal dilatation. Pancreatic tail atrophy again noted.  In January of the main portal vein was thrombosed. On today images no portal venous system enhancement is identified.  The stomach is thick walled. Oral contrast was administered, and has reached mid small bowel loops.  Major arterial structures in the abdomen and pelvis appear patent. Aortoiliac calcified atherosclerosis noted. Renal enhancement and contrast excretion is within normal limits.  IMPRESSION: 1. Progression of disease. Progressed and now diffuse portal venous system thrombosis since January. 2. Anasarca. Large volume ascites, mass effect on the liver may reflect subcapsular or loculated ascites. Diffuse moderate to severe bowel wall thickening. Moderate bilateral pleural effusions. 3. Fluid-filled and dilated small bowel loop draining the pancreas.   Electronically Signed   By: Genevie Ann M.D.   On: 11/14/2014 14:42   US Abdomen Limited  11/16/2014   CLINICAL DATA:  Abdominal distention question ascites, history Whipple procedure, hypertension, pancreatic cancer  EXAM: LIMITED ABDOMEN ULTRASOUND FOR ASCITES  TECHNIQUE: Limited ultrasound survey for ascites was performed in all four abdominal quadrants.  COMPARISON:  11/14/2014 CT abdomen and pelvis  FINDINGS: Significant ascites is identified in both lower  quadrants.  Smaller amount of ascites is seen in the LEFT upper quadrant, with even less in the perihepatic region.  IMPRESSION: Significant ascites.   Electronically Signed   By: Lavonia Dana M.D.   On: 11/16/2014 17:34   US Paracentesis  11/20/2014   INDICATION: Pancreatic cancer, hepatitis-C, ascites. Request is made for diagnostic and therapeutic paracentesis.  EXAM: ULTRASOUND-GUIDED DIAGNOSTIC AND THERAPEUTIC PARACENTESIS  COMPARISON:  Prior paracentesis on 11/17/2014  MEDICATIONS: None.  COMPLICATIONS: None immediate  TECHNIQUE: Informed written consent was obtained from the patient after a discussion of the risks, benefits and alternatives to treatment. A timeout was performed prior to the initiation of the procedure.  Initial ultrasound scanning demonstrates a small amount of ascites within the right lower abdominal quadrant. The right lower abdomen was prepped and draped in the usual sterile fashion. 1% lidocaine was used for local anesthesia. Under direct ultrasound guidance, a 19 gauge, 7-cm, Yueh catheter was introduced. An ultrasound image was saved for documentation purposed. The paracentesis was performed. The catheter was removed and a dressing was applied. The patient tolerated the procedure well without immediate post procedural complication.  FINDINGS: A total of approximately 1.7 liters of clear, yellow fluid was removed. Samples were sent to the laboratory as requested by the clinical team.  IMPRESSION: Successful ultrasound-guided diagnostic and therapeutic paracentesis yielding 1.7 liters of peritoneal fluid.  Read by: Rowe Robert, PA-C   Electronically Signed   By: Jerilynn Mages.  Shick M.D.   On: 11/20/2014 10:53   US Paracentesis  11/17/2014   INDICATION: ascites  CLINICAL DATA:  Request for therapeutic paracentesis  EXAM: ULTRASOUND-GUIDED PARACENTESIS  ULTRASOUND GUIDED RLQ PARACENTESIS  COMPARISON:  None.  MEDICATIONS: 10 cc 1% lidocaine  COMPLICATIONS: None immediate  TECHNIQUE: Informed  written consent was obtained from the patient after a discussion of the risks, benefits  and alternatives to treatment. A timeout was performed prior to the initiation of the procedure.  Initial ultrasound scanning demonstrates a large amount of ascites within the right lower abdominal quadrant. The right lower abdomen was prepped and draped in the usual sterile fashion. 1% lidocaine with epinephrine was used for local anesthesia. Under direct ultrasound guidance, a 19 gauge, 7-cm, Yueh catheter was introduced. An ultrasound image was saved for documentation purposed. the paracentesis was performed. The catheter was removed and a dressing was applied. The patient tolerated the procedure well without immediate post procedural complication.  FINDINGS: A total of approximately 3 liters of serous fluid was removed.  IMPRESSION: Successful ultrasound-guided paracentesis yielding 3 liters of peritoneal fluid.  PROCEDURE: Ultrasound was performed to localize and mark an adequate pocket of fluid in the right quadrant of the abdomen. The area was then prepped and draped in the normal sterile fashion. 1% Lidocaine was used for local anesthesia. Under ultrasound guidance a 19 gauge Yueh catheter was introduced. Paracentesis was performed. The catheter was removed and a dressing applied.  Read by:  Lavonia Drafts [facility]   Electronically Signed   By: Jacqulynn Cadet M.D.   On: 11/17/2014 11:31   US Paracentesis  11/15/2014   CLINICAL DATA:  Ascites, history of pancreatic cancer. Request therapeutic and diagnostic paracentesis of up to 3 L max.  EXAM: ULTRASOUND GUIDED PARACENTESIS  COMPARISON:  None.  PROCEDURE: An ultrasound guided paracentesis was thoroughly discussed with the patient and questions answered. The benefits, risks, alternatives and complications were also discussed. The patient understands and wishes to proceed with the procedure. Written consent was obtained.  Ultrasound was performed to localize and mark an  adequate pocket of fluid in the left lower quadrant of the abdomen. The area was then prepped and draped in the normal sterile fashion. 1% Lidocaine was used for local anesthesia. Under ultrasound guidance a 19 gauge Yueh catheter was introduced. Paracentesis was performed. The catheter was removed and a dressing applied.  COMPLICATIONS: None immediate  FINDINGS: A total of approximately 3 L of clear yellow fluid was removed. A fluid sample was sent for laboratory analysis.  IMPRESSION: Successful ultrasound guided paracentesis yielding 3 L of ascites.  Read by: Ascencion Dike PA-C   Electronically Signed   By: Corrie Mckusick D.O.   On: 11/15/2014 12:55    CBC  Recent Labs Lab 11/18/14 0545 11/19/14 0520 11/20/14 0500 11/21/14 0530 11/22/14 0505  WBC 5.7 4.1 5.4 4.9 5.0  HGB 7.6* 7.0* 9.2* 8.5* 8.8*  HCT 23.2* 21.8* 27.0* 25.5* 26.9*  PLT 219 192 196 153 167  MCV 85.9 86.5 86.8 89.2 90.0  MCH 28.1 27.8 29.6 29.7 29.4  MCHC 32.8 32.1 34.1 33.3 32.7  RDW 23.9* 24.3* 22.8* 23.3* 23.8*  LYMPHSABS  --   --   --   --  0.8  MONOABS  --   --   --   --  0.6  EOSABS  --   --   --   --  0.2  BASOSABS  --   --   --   --  0.0    Chemistries   Recent Labs Lab 11/18/14 0545 11/19/14 0520 11/20/14 0500 11/21/14 0530 11/22/14 0505  NA 137 138 139 139 136  K 3.1* 3.3* 4.1 4.4 4.4  CL 113* 114* 115* 118* 112  CO2 20 20 19 19 21   GLUCOSE 93 93 80 93 85  BUN 7 6 5* <5* 5*  CREATININE 0.33* <0.30* <  0.30* <0.30* 0.44*  CALCIUM 7.5* 7.8* 7.9* 7.6* 7.8*  AST 100* 70* 64* 59* 54*  ALT 50* 37* 35 32 29  ALKPHOS 905* 671* 633* 541* 541*  BILITOT 6.3* 8.4* 8.7* 8.0* 8.0*   ------------------------------------------------------------------------------------------------------------------ estimated creatinine clearance is 61.1 mL/min (by C-G formula based on Cr of 0.44). ------------------------------------------------------------------------------------------------------------------ No results for  input(s): HGBA1C in the last 72 hours. ------------------------------------------------------------------------------------------------------------------ No results for input(s): CHOL, HDL, LDLCALC, TRIG, CHOLHDL, LDLDIRECT in the last 72 hours. ------------------------------------------------------------------------------------------------------------------ No results for input(s): TSH, T4TOTAL, T3FREE, THYROIDAB in the last 72 hours.  Invalid input(s): FREET3 ------------------------------------------------------------------------------------------------------------------ No results for input(s): VITAMINB12, FOLATE, FERRITIN, TIBC, IRON, RETICCTPCT in the last 72 hours.  Coagulation profile No results for input(s): INR, PROTIME in the last 168 hours.  No results for input(s): DDIMER in the last 72 hours.  Cardiac Enzymes No results for input(s): CKMB, TROPONINI, MYOGLOBIN in the last 168 hours.  Invalid input(s): CK ------------------------------------------------------------------------------------------------------------------ Invalid input(s): POCBNP  Verneita Griffes, MD [facility] [phone]

## 2014-11-22 NOTE — Progress Notes (Signed)
Feels better since last paracentesis but she is aware of ascites re accumulating.  I think we can consider her ascites as a transudate  as a result of portal vein thrombosis.  Plan to increase Aldactone to 100 mg daily and Lasix 40 mg daily with the hope that this will prevent reaccumulation of ascites.  [place] shunt should be relegated as a last resort to provide long-term comfort care

## 2014-11-22 NOTE — Care Management Note (Addendum)
    Page 1 of 2   11/28/2014     10:25:46 AM CARE MANAGEMENT NOTE 11/28/2014  Patient:  Melinda Conrad, Melinda Conrad   Account Number:  [id number]  Date Initiated:  11/15/2014  Documentation initiated by:  DAVIS,RHONDA  Subjective/Objective Assessment:   hypokalemia, abnormal ecg, poss. sepsis, Whipple's procedure 08/07/14, abd pain and distention     Action/Plan:   home when stable   Anticipated DC Date:  11/29/2014   Anticipated DC Plan:  [place]  In-house referral  NA      DC Planning Services  CM consult      PAC Choice  HOSPICE   Choice offered to / List presented to:  C-1 Patient   DME arranged  NA      DME agency  NA        [facility]   Status of service:  Completed, signed off Medicare Important Message given?   (If response is "NO", the following Medicare IM given date fields will be blank) Date Medicare IM given:   Medicare IM given by:   Date Additional Medicare IM given:   Additional Medicare IM given by:    Discharge Disposition:  [place]  Per UR Regulation:  Reviewed for med. necessity/level of care/duration of stay  If discussed at [place] of Stay Meetings, dates discussed:   11/20/2014  11/22/2014  11/27/2014    Comments:  11-28-14 Sunday Spillers RN CM 1030 Patient now wants to go home with hospice services, contacted [facility] to see patient. Plan for d/c 3/10 after paracentesis.  11/23/14 Dessa Phi RN BSN NCM [phone] 4:15p-CM met w/[facility] liason-Margie, went into patient's rm to discuss d/c plans.Discussed with son,grand dtr in patient's rm.Patient wants home w/HHC.Provided w/HHC agency list as resource. Await choice. 2:50p-Received call from [facility] rep-informed me of PMT note-patient now open to all options & medical interventions to prolong life.[facility] will confirm w/patient, & family, since this may change d/c plan if no longer a candidate for home hospice services.Will inform weekend CM to  f/u on d/c plans.  11-22-14 Sunday Spillers RN CM 1120 Spoke with patient and daughter, have chosen [facility]. Contacted [facility] for referral.  Feb. 25 2016/Rhonda L. Rosana Hoes, RN, BSN, CCM. Case Management [place] [phone] No discharge needs present of time of review.

## 2014-11-22 NOTE — Progress Notes (Signed)
INITIAL NUTRITION ASSESSMENT  DOCUMENTATION CODES Per approved criteria  -Severe malnutrition in the context of chronic illness  Pt meets criteria for severe MALNUTRITION in the context of chronic illness as evidenced by severe fat and muscle wasting.  INTERVENTION: - Diet advancement per MD - If diet able to be advanced, will add Magic cup TID with meals, each supplement provides 290 kcal and 9 grams of protein - Continue Resource Breeze po TID, each supplement provides 250 kcal and 9 grams of protein  NUTRITION DIAGNOSIS: Inadequate oral intake related to inability to eat as evidenced by NPO.   Goal: Pt to meet >/= 90% of their estimated nutrition needs   Monitor:  Weight trend, NPO, labs  Reason for Assessment: Malnutrition Screening Tool  64 y.o. female  Admitting Dx: Hepatic encephalopathy  ASSESSMENT: 64 y.o. female with history of adenocarcinoma of head of pancreas, status post Whipple's procedure 08/07/14, completed concurrent capecitabine and radiation, poor performance status and hence did not get adjuvant chemotherapy, recurrent Klebsiella bacteremia- attributed to intra-abdominal source/presumed septic portal vein thrombus, acute portal vein thrombosis-not on anticoagulation secondary to GI bleed, tobacco abuse & hepatitis C presented to the [facility] ED on 11/14/14 with complaints of progressive mental status changes, abdominal distention/pain and poor appetite.   2/25: - Pt seen by dietitian during previous admission. - Pt currently NPO and asking for po liquids.  - PO intake has been very little for the past several weeks.  - Pt s/p paracentesis today.  - She does not like Ensure supplements. Agreed to try [employer] and [employer].  - Pt's wt is increased, probably due to fluid retention.  3/3: - Pt s/p paracentesis, but ascites is accumulating.  - Pt followed by Palliative Care. Goals of care are transitioning to comfort care upon discharge.  -  Pt has been on NPO/Clears since 2/24 (9 days) - Pt has been tolerating clears and says that she likes Resource Breeze supplements. She ate a popsikle prior to RD visit. Daughter to order dinner tray for pt.  - No further nutritional intervention at this time.   Nutrition Focused Physical Exam:  Subcutaneous Fat:  Orbital Region: severe depletion Upper Arm Region: severe depletion Thoracic and Lumbar Region: severe depletion  Muscle:  Temple Region: severe depletion Clavicle Bone Region: severe depletion Clavicle and Acromion Bone Region:severe depletion  Scapular Bone Region: severe depletion Dorsal Hand:severe depletion  Patellar Region: severe depletion Anterior Thigh Region: severe depletion Posterior Calf Region: severe depletion  Edema: none  Height: Ht Readings from Last 1 Encounters:  11/14/14 5\' 3"  (1.6 m)    Weight: Wt Readings from Last 1 Encounters:  11/22/14 138 lb 2 oz (62.653 kg)    Ideal Body Weight: 52.4 kg  % Ideal Body Weight: 109%  Wt Readings from Last 10 Encounters:  11/22/14 138 lb 2 oz (62.653 kg)  10/29/14 111 lb 1.6 oz (50.395 kg)  10/17/14 100 lb 8.5 oz (45.6 kg)  10/09/14 127 lb 3.3 oz (57.7 kg)  09/10/14 109 lb 9.6 oz (49.714 kg)  08/07/14 118 lb 6.2 oz (53.7 kg)  08/02/14 121 lb 11.2 oz (55.203 kg)  07/30/14 121 lb (54.885 kg)  07/26/14 122 lb 1.6 oz (55.384 kg)  06/22/14 125 lb 4.8 oz (56.836 kg)    BMI:  Body mass index is 24.47 kg/(m^2).  Estimated Nutritional Needs: Kcal: 1600-1800 Protein: 85-100 g Fluid: 1.8 L/day  Skin: closed incision on abdomen  Diet Order: Diet clear liquid  EDUCATION NEEDS: -  Education needs addressed   Intake/Output Summary (Last 24 hours) at 11/22/14 1708 Last data filed at 11/22/14 1501  Gross per 24 hour  Intake   1860 ml  Output   1750 ml  Net    110 ml    Last BM: 3/2  Labs:   Recent Labs Lab 11/20/14 0500 11/21/14 0530 11/22/14 0505  NA 139 139 136  K 4.1 4.4 4.4  CL  115* 118* 112  CO2 19 19 21   BUN 5* <5* 5*  CREATININE <0.30* <0.30* 0.44*  CALCIUM 7.9* 7.6* 7.8*  GLUCOSE 80 93 85    CBG (last 3)  No results for input(s): GLUCAP in the last 72 hours.  Scheduled Meds: . ampicillin-sulbactam (UNASYN) IV  3 g Intravenous 4 times per day  . antiseptic oral rinse  7 mL Mouth Rinse q12n4p  . chlorhexidine  15 mL Mouth Rinse BID  . feeding supplement (RESOURCE BREEZE)  1 Container Oral TID BM  . furosemide  40 mg Oral Daily  . lactulose  300 mL Rectal Once  . lactulose  300 mL Rectal Once  . OxyCODONE  15 mg Oral Q12H  . pantoprazole (PROTONIX) IV  40 mg Intravenous Q24H  . potassium chloride  40 mEq Oral BID  . sodium chloride  3 mL Intravenous Q12H  . spironolactone  100 mg Oral Daily    Continuous Infusions: . sodium chloride 75 mL/hr at 11/22/14 0103    Past Medical History  Diagnosis Date  . Arthritis   . Depression   . H/O: substance abuse   . Back pain   . Personal history of colonic polyp - adenoma 10/26/2013    10/26/2013 diminutive sigmoid polyp removed    . Pancreatitis, acute   . Hypertension   . Osteoporosis   . Radiation 04/30/14-06/08/14    pancreas 50 gray  . Cancer     pancreatic cancer  . Hemorrhagic shock 09/14/2014  . Acute GI bleeding 09/14/2014  . Thrombocytopenia 09/16/2014  . 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 Acute respiratory failure with hypoxia   . Bacteremia due to Klebsiella pneumoniae   . Anemia due to acute blood loss 09/26/2014    Past Surgical History  Procedure Laterality Date  . Lumbar epidural injection    . Colonoscopy    . Ercp N/A 03/06/2014    Procedure: ENDOSCOPIC RETROGRADE CHOLANGIOPANCREATOGRAPHY (ERCP);  Surgeon: Gatha Mayer, MD;  Location: [facility] ENDOSCOPY;  Service: Endoscopy;  Laterality: N/A;  . Eus N/A 04/05/2014    Procedure: UPPER ENDOSCOPIC ULTRASOUND (EUS) RADIAL;  Surgeon: [name], MD;  Location: [facility] ENDOSCOPY;  Service: Endoscopy;  Laterality: N/A;  . Whipple procedure  N/A 08/07/2014    Procedure:  DIAGNOSTIC LAPAROSCOPY, WHIPPLE PROCEDURE;  Surgeon: Stark Klein, MD;  Location: [facility] ORS;  Service: General;  Laterality: N/A;  . Esophagogastroduodenoscopy N/A 09/28/2014    Procedure: ESOPHAGOGASTRODUODENOSCOPY (EGD);  Surgeon: Ladene Artist, MD;  Location: [facility] ENDOSCOPY;  Service: Endoscopy;  Laterality: N/A;    Laurette Schimke [place], [place], [place]

## 2014-11-22 NOTE — Progress Notes (Signed)
Progress Note from the Palliative Medicine Team at [facility]:   -patein is uncomfortable and asking for pain medication, reports leakage from her foley catheter, appears generally uncomfortable--nursing informed   -f/u yesterday's [facility] meeting, patient is comfortable with her decision for comfort and hope for dc home with hospice after pluerex is placed for refractory ascites, it was discussed that this is a measure to solely enhance comfort, it is not a life prolonging measure      Objective: No Known Allergies Scheduled Meds: . ampicillin-sulbactam (UNASYN) IV  3 g Intravenous 4 times per day  . antiseptic oral rinse  7 mL Mouth Rinse q12n4p  . chlorhexidine  15 mL Mouth Rinse BID  . feeding supplement (RESOURCE BREEZE)  1 Container Oral TID BM  . furosemide  40 mg Oral Daily  . lactulose  300 mL Rectal Once  . lactulose  300 mL Rectal Once  . OxyCODONE  15 mg Oral Q12H  . pantoprazole (PROTONIX) IV  40 mg Intravenous Q24H  . potassium chloride  40 mEq Oral BID  . sodium chloride  3 mL Intravenous Q12H  . spironolactone  100 mg Oral Daily   Continuous Infusions: . sodium chloride 75 mL/hr at 11/22/14 0103   PRN Meds:.acetaminophen, albuterol, bisacodyl, hydrALAZINE, morphine injection, ondansetron (ZOFRAN) IV, oxyCODONE, sodium chloride  BP 110/66 mmHg  Pulse 63  Temp(Src) 97.8 F (36.6 C) (Oral)  Resp 16  Ht 5\' 3"  (1.6 m)  Wt 62.653 kg (138 lb 2 oz)  BMI 24.47 kg/m2  SpO2 100%   PPS:30 % at best     Intake/Output Summary (Last 24 hours) at 11/22/14 1020 Last data filed at 11/22/14 0914  Gross per 24 hour  Intake   1740 ml  Output   1650 ml  Net     90 ml       Physical Exam:  General: chronically ill appearing  NAD HEENT:  Moist buccal  Membranes, no exudate Chest:   Decreased in bases CVS: RRR Abdomen: distended, firm and tender Ext: BLE +2 edema  Labs: CBC    Component Value Date/Time   WBC 5.0 11/22/2014 0505   WBC 4.2 06/22/2014  0935   RBC 2.99* 11/22/2014 0505   RBC 4.37 06/22/2014 0935   HGB 8.8* 11/22/2014 0505   HGB 13.7 06/22/2014 0935   HCT 26.9* 11/22/2014 0505   HCT 40.1 06/22/2014 0935   PLT 167 11/22/2014 0505   PLT 168 06/22/2014 0935   MCV 90.0 11/22/2014 0505   MCV 91.8 06/22/2014 0935   MCH 29.4 11/22/2014 0505   MCH 31.4 06/22/2014 0935   MCHC 32.7 11/22/2014 0505   MCHC 34.2 06/22/2014 0935   RDW 23.8* 11/22/2014 0505   RDW 15.2* 06/22/2014 0935   LYMPHSABS 0.8 11/22/2014 0505   LYMPHSABS 0.5* 06/22/2014 0935   MONOABS 0.6 11/22/2014 0505   MONOABS 0.4 06/22/2014 0935   EOSABS 0.2 11/22/2014 0505   EOSABS 0.1 06/22/2014 0935   BASOSABS 0.0 11/22/2014 0505   BASOSABS 0.0 06/22/2014 0935    BMET    Component Value Date/Time   NA 136 11/22/2014 0505   NA 141 06/22/2014 0935   K 4.4 11/22/2014 0505   K 4.1 06/22/2014 0935   CL 112 11/22/2014 0505   CO2 21 11/22/2014 0505   CO2 22 06/22/2014 0935   GLUCOSE 85 11/22/2014 0505   GLUCOSE 79 06/22/2014 0935   BUN 5* 11/22/2014 0505   BUN 10.7 06/22/2014 0935  CREATININE 0.44* 11/22/2014 0505   CREATININE 0.9 06/22/2014 0935   CALCIUM 7.8* 11/22/2014 0505   CALCIUM 9.6 06/22/2014 0935   GFRNONAA >90 11/22/2014 0505   GFRAA >90 11/22/2014 0505    CMP     Component Value Date/Time   NA 136 11/22/2014 0505   NA 141 06/22/2014 0935   K 4.4 11/22/2014 0505   K 4.1 06/22/2014 0935   CL 112 11/22/2014 0505   CO2 21 11/22/2014 0505   CO2 22 06/22/2014 0935   GLUCOSE 85 11/22/2014 0505   GLUCOSE 79 06/22/2014 0935   BUN 5* 11/22/2014 0505   BUN 10.7 06/22/2014 0935   CREATININE 0.44* 11/22/2014 0505   CREATININE 0.9 06/22/2014 0935   CALCIUM 7.8* 11/22/2014 0505   CALCIUM 9.6 06/22/2014 0935   PROT 6.6 11/22/2014 0505   PROT 8.7* 06/08/2014 1013   ALBUMIN 1.4* 11/22/2014 0505   ALBUMIN 3.4* 06/08/2014 1013   AST 54* 11/22/2014 0505   AST 24 06/08/2014 1013   ALT 29 11/22/2014 0505   ALT 15 06/08/2014 1013   ALKPHOS  541* 11/22/2014 0505   ALKPHOS 89 06/08/2014 1013   BILITOT 8.0* 11/22/2014 0505   BILITOT 0.47 06/08/2014 1013   GFRNONAA >90 11/22/2014 0505   GFRAA >90 11/22/2014 0505      Assessment and Plan: 1. Code Status: DNR/DNI -comfort is main focus of care 2. Symptom Control: Pain Increase Morphine Er 15 mg every 12 hr  Morphine IR 5 mg every 3 hrs prn  3. Psycho/Social:  Emotional support offered to patient at bedside, left voice message for daughter 4. Spiritual  Chaplain involved 5. Disposition:  Home with hospice services with focus of comfort quality and dignity     Time In Time Out Total Time Spent with Patient Total Overall Time  0800 0835 35 min 35 min    Greater than 50%  of this time was spent counseling and coordinating care related to the above assessment and plan.  Wadie Lessen NP  Palliative Medicine Team Team Phone # [phone] Pager [phone]  Discussed with Dr Verlon Au 1

## 2014-11-22 NOTE — Consult Note (Signed)
Chief Complaint: Chief Complaint  Patient presents with  . Ascites  . Weakness  . Altered Mental Status    Referring Physician(s):  Dr. Verlon Au of Internal Medicine Team.   History of Present Illness: Melinda Conrad is a 64 y.o. female admitted on 12/13/2014 with mental status changes, likely related to acute hepatic encephalopathy.  She has been referred to Interventional Radiology for an evaluation for palliation of her recurrent ascites.    Melinda Conrad has 2 potential sources for her recurrent ascites; malignant ascites or ascites secondary to portal hypertension.  She has a history of a prior pancreatic carcinoma, treated with surgical resection (classic Whipple, 08/07/2014), and chemotherapy.  She also has a history of HCV, cirrhotic changes of liver on imaging, and associated portal vein thrombosis.   I believe that the ascites is related to the portal hypertension, given that the ascites protein content is less than 3g/dL, and that currently the patient has evidence of cured pancreatic disease.    Options for palliation of recurrent ascites for Melinda Conrad include medical therapy, Denver Shunt (peritoneal-venous shunt), or external peritoneal shunting with tunneled catheter.    Melinda Conrad reports that she does have epigastric pain that is episodic, but that she is not terribly uncomfortable from the standpoint of distention.   I met Melinda Conrad and her daughter today and had a long discussion about the role of Interventional Radiology for her recurrent ascites, the options available for treatment, and what I believe is the best available information to offer a prognosis for medical therapy versus aggressive treatment.  Specifically, I quoted her and her family prognosis for her MELD score of 20, which is approximately 6 month survival of 80%, and also her prognosis of a Child-Pugh Category C, which is a 1 year survival of 45%, and a 2 year survival of 35%.  I did offer my opinion  that an externalized tunneled peritoneal catheter would significantly shorten her survival.    I shared with her my discussion of her care with Dr. Deatra Ina of GI service, with whom I personally communicated today about plan-of-care, and that by maximizing medical management of her diuretics and other medications, her rate of fluid accumulation might be minimized.  I did emphasize that this would not change her overall prognosis.    I did not explicitly discuss a Denver Shunt at this time, but this option could be re-visited if her goals of care / plan of care changes.      Past Medical History  Diagnosis Date  . Arthritis   . Depression   . H/O: substance abuse   . Back pain   . Personal history of colonic polyp - adenoma 10/26/2013    10/26/2013 diminutive sigmoid polyp removed    . Pancreatitis, acute   . Hypertension   . Osteoporosis   . Radiation 04/30/14-06/08/14    pancreas 50 gray  . Cancer     pancreatic cancer  . Hemorrhagic shock 09/14/2014  . Acute GI bleeding 09/14/2014  . Thrombocytopenia 09/16/2014  . 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 Acute respiratory failure with hypoxia   . Bacteremia due to Klebsiella pneumoniae   . Anemia due to acute blood loss 09/26/2014    Past Surgical History  Procedure Laterality Date  . Lumbar epidural injection    . Colonoscopy    . Ercp N/A 03/06/2014    Procedure: ENDOSCOPIC RETROGRADE CHOLANGIOPANCREATOGRAPHY (ERCP);  Surgeon: Gatha Mayer, MD;  Location: [facility];  Service:  Endoscopy;  Laterality: N/A;  . Eus N/A 04/05/2014    Procedure: UPPER ENDOSCOPIC ULTRASOUND (EUS) RADIAL;  Surgeon: [name], MD;  Location: [facility];  Service: Endoscopy;  Laterality: N/A;  . Whipple procedure N/A 08/07/2014    Procedure:  DIAGNOSTIC LAPAROSCOPY, WHIPPLE PROCEDURE;  Surgeon: Stark Klein, MD;  Location: [facility];  Service: General;  Laterality: N/A;  . Esophagogastroduodenoscopy N/A 09/28/2014    Procedure:  ESOPHAGOGASTRODUODENOSCOPY (EGD);  Surgeon: Ladene Artist, MD;  Location: [facility];  Service: Endoscopy;  Laterality: N/A;    Allergies: Review of patient's allergies indicates no known allergies.  Medications: Prior to Admission medications   Medication Sig Start Date End Date Taking? Authorizing Provider  antiseptic oral rinse (CPC / CETYLPYRIDINIUM CHLORIDE 0.05%) 0.05 % LIQD solution 7 mLs by Mouth Rinse route 2 (two) times daily. 10/09/14  Yes Robbie Lis, MD  CALCIUM-VITAMIN D PO Take 1 capsule by mouth daily.   Yes Historical Provider, MD  feeding supplement, ENSURE COMPLETE, (ENSURE COMPLETE) LIQD Take 237 mLs by mouth 3 (three) times daily between meals. 10/09/14  Yes Robbie Lis, MD  megestrol (MEGACE ORAL) 40 MG/ML suspension Take 5 mLs (200 mg total) by mouth 2 (two) times daily. 10/29/14  Yes Owens Shark, NP  ondansetron (ZOFRAN) 4 MG tablet Take 1 tablet (4 mg total) by mouth every 8 (eight) hours as needed for nausea or vomiting. 10/09/14  Yes Robbie Lis, MD  Ampicillin-Sulbactam 3 g in sodium chloride 0.9 % 100 mL Inject 3 g into the vein every 6 (six) hours. Patient not taking: Reported on 11/14/2014 10/23/14   Venetia Maxon Rama, MD  fentaNYL (DURAGESIC - DOSED MCG/HR) 25 MCG/HR patch Place 1 patch (25 mcg total) onto the skin every 3 (three) days. Patient not taking: Reported on 11/14/2014 10/09/14   Robbie Lis, MD  oxyCODONE-acetaminophen (PERCOCET/ROXICET) 5-325 MG per tablet Take 1-2 tablets by mouth every 4 (four) hours as needed for moderate pain. Patient not taking: Reported on 11/14/2014 10/29/14   Owens Shark, NP  pantoprazole (PROTONIX) 40 MG tablet Take 1 tablet (40 mg total) by mouth 2 (two) times daily as needed (heartburn). Patient not taking: Reported on 11/14/2014 10/23/14   Venetia Maxon Rama, MD  pregabalin (LYRICA) 25 MG capsule Take 1 capsule (25 mg total) by mouth at bedtime. Patient not taking: Reported on 11/14/2014 10/09/14   Robbie Lis, MD    prochlorperazine (COMPAZINE) 5 MG tablet TAKE ONE TO TWO TABS EVERY SIX HOURS PRN. Patient not taking: Reported on 11/14/2014 10/11/14   Ladell Pier, MD  sucralfate (CARAFATE) 1 GM/10ML suspension Take 10 mLs (1 g total) by mouth 4 (four) times daily -  with meals and at bedtime. Patient not taking: Reported on 11/14/2014 10/09/14   Robbie Lis, MD     Family History  Problem Relation Age of Onset  . Colon cancer Neg Hx   . Esophageal cancer Neg Hx   . Rectal cancer Neg Hx   . Stomach cancer Neg Hx   . CAD Mother   . Cancer Father   . Cirrhosis Sister     she is a heavy drinker.     History   Social History  . Marital Status: Legally Separated    Spouse Name: N/A  . Number of Children: 3  . Years of Education: N/A   Occupational History  . vocational rehabilitation    Social History Main Topics  . Smoking status: Current  Every Day Smoker -- 0.25 packs/day for 40 years    Types: Cigarettes  . Smokeless tobacco: Current User  . Alcohol Use: No     Comment: occasionally. Quit  , none in 3weeks"only drank before  to help with pain before ERD visit"  . Drug Use: No  . Sexual Activity: Yes   Other Topics Concern  . None   Social History Narrative    ECOG Status: 3 - Symptomatic, >50% confined to bed  Review of Systems: A 12 point ROS discussed and pertinent positives are indicated in the HPI above.  All other systems are negative.  Review of Systems  Vital Signs: BP 102/62 mmHg  Pulse 58  Temp(Src) 97.8 F (36.6 C) (Oral)  Resp 16  Ht _0  (1.6 m)  Wt 138 lb 2 oz (62.653 kg)  BMI 24.47 kg/m2  SpO2 100%  Atraumatic, normocephalic.  Mucous membranes moist pink.   No scleral injection.  Cervical ROM intact. Poor dentition. No adenopathy of cervical region.  Symmetric excursion of the chest on inspiration and expiration.  No labored breathing.  Abdomen non-obese.  Fluid wave.   Genitourinary deferred.   Physical Exam     Mallampati Score:      Imaging: Dg Abd 1 View  11/16/2014   CLINICAL DATA:  Abdominal pain and distention.  EXAM: ABDOMEN - 1 VIEW  COMPARISON:  CT on 11/14/2014  FINDINGS: Chronic and bowel loops in the central abdomen is consistent with ascites. Surgical clips and piece of catheter or stent again seen in right upper quadrant. Mild dilatation of small bowel loops and colon is seen in the central abdomen likely due to on adynamic ileus. Some bowel gas in contrast is seen in the rectum.  IMPRESSION: Ascites and probable adynamic ileus.   Electronically Signed   By: Earle Gell M.D.   On: 11/16/2014 16:06   Ct Head Wo Contrast  11/14/2014   CLINICAL DATA:  Altered mental status  EXAM: CT HEAD WITHOUT CONTRAST  TECHNIQUE: Contiguous axial images were obtained from the base of the skull through the vertex without intravenous contrast.  COMPARISON:  October 27, 2005  FINDINGS: The ventricles are normal in size and configuration. There is no mass, hemorrhage, extra-axial fluid collection, or midline shift. Gray-white compartments are normal. No acute infarct apparent. The bony calvarium appears intact. The mastoid air cells are clear.  IMPRESSION: No intracranial mass, hemorrhage, or focal gray -white compartment lesions/acute appearing infarct.   Electronically Signed   By: Lowella Grip III M.D.   On: 11/14/2014 14:17   US Abdomen Complete  11/14/2014   CLINICAL DATA:  Pancreatic cancer post Whipple procedure 08/07/2014, abdominal swelling for 1 week, prior cholecystectomy, hypertension, radiation therapy, smoker  EXAM: ULTRASOUND ABDOMEN COMPLETE  COMPARISON:  CT abdomen and pelvis 10/16/2014  FINDINGS: Gallbladder: Surgically absent  Common bile duct: Diameter: 5 mm diameter, normal  Liver: Heterogeneous in echogenicity. No discrete mass. Portal vein is not well demonstrated ; portal vein demonstrated thrombosis on the prior CT.  IVC: Intrahepatic portion normal appearance. Infrahepatic portion nonvisualized due to bowel  gas.  Pancreas: Post Whipple procedure by history. Pancreatic bed inadequately visualized due to bowel gas.  Spleen: Normal appearance, 5.0 cm length  Right Kidney: Length: 9.3 cm. Normal morphology without mass or hydronephrosis.  Left Kidney: Length: 10.0 cm. Normal morphology without mass or hydronephrosis.  Abdominal aorta: Proximally normal caliber, remainder obscured by bowel gas  Other findings: Significant ascites throughout abdomen and pelvis.  IMPRESSION: Significant ascites significantly increased since prior CT exam, likely cause of abdominal swelling.  Limited exam due to bowel gas, with inadequate visualization of the pancreatic bed, portal vein and portions of the aorta.  Post cholecystectomy.   Electronically Signed   By: Lavonia Dana M.D.   On: 11/14/2014 14:32   Ct Abdomen Pelvis W Contrast  11/14/2014   CLINICAL DATA:  64 year old female with abdominal pain and distension. Current history of pancreatic cancer status post Whipple and radiation. Subsequent encounter.  EXAM: CT ABDOMEN AND PELVIS WITH CONTRAST  TECHNIQUE: Multidetector CT imaging of the abdomen and pelvis was performed using the standard protocol following bolus administration of intravenous contrast.  CONTRAST:  133m OMNIPAQUE IOHEXOL 300 MG/ML  SOLN  COMPARISON:  10/16/2014 and earlier.  FINDINGS: New moderate size bilateral pleural effusions with lower lobe atelectasis. No pericardial effusion.  Advanced degenerative changes in the lower lumbar spine. No acute osseous abnormality identified.  Increased body wall stranding and density in keeping with a degree of anasarca. Moderate to large volume of ascites in the abdomen has significantly progressed, and in the upper abdomen this is associated with new lobular mass effect on the liver contour (series 2, image 17).  No dilated large bowel. Widespread small and large bowel wall thickening. Pancreatic or biliary stent re- identified within a small bowel loop in the right upper  quadrant which today is dilated with fluid up to 4 cm diameter. There is continued intra and extrahepatic biliary ductal dilatation. Pancreatic tail atrophy again noted.  In January of the main portal vein was thrombosed. On today images no portal venous system enhancement is identified.  The stomach is thick walled. Oral contrast was administered, and has reached mid small bowel loops.  Major arterial structures in the abdomen and pelvis appear patent. Aortoiliac calcified atherosclerosis noted. Renal enhancement and contrast excretion is within normal limits.  IMPRESSION: 1. Progression of disease. Progressed and now diffuse portal venous system thrombosis since January. 2. Anasarca. Large volume ascites, mass effect on the liver may reflect subcapsular or loculated ascites. Diffuse moderate to severe bowel wall thickening. Moderate bilateral pleural effusions. 3. Fluid-filled and dilated small bowel loop draining the pancreas.   Electronically Signed   By: HGenevie AnnM.D.   On: 11/14/2014 14:42   UKoreaAbdomen Limited  11/16/2014   CLINICAL DATA:  Abdominal distention question ascites, history Whipple procedure, hypertension, pancreatic cancer  EXAM: LIMITED ABDOMEN ULTRASOUND FOR ASCITES  TECHNIQUE: Limited ultrasound survey for ascites was performed in all four abdominal quadrants.  COMPARISON:  11/14/2014 CT abdomen and pelvis  FINDINGS: Significant ascites is identified in both lower quadrants.  Smaller amount of ascites is seen in the LEFT upper quadrant, with even less in the perihepatic region.  IMPRESSION: Significant ascites.   Electronically Signed   By: MLavonia DanaM.D.   On: 11/16/2014 17:34   UKoreaParacentesis  11/20/2014   INDICATION: Pancreatic cancer, hepatitis-C, ascites. Request is made for diagnostic and therapeutic paracentesis.  EXAM: ULTRASOUND-GUIDED DIAGNOSTIC AND THERAPEUTIC PARACENTESIS  COMPARISON:  Prior paracentesis on 11/17/2014  MEDICATIONS: None.  COMPLICATIONS: None immediate   TECHNIQUE: Informed written consent was obtained from the patient after a discussion of the risks, benefits and alternatives to treatment. A timeout was performed prior to the initiation of the procedure.  Initial ultrasound scanning demonstrates a small amount of ascites within the right lower abdominal quadrant. The right lower abdomen was prepped and draped in the usual sterile fashion. 1% lidocaine was  used for local anesthesia. Under direct ultrasound guidance, a 19 gauge, 7-cm, Yueh catheter was introduced. An ultrasound image was saved for documentation purposed. The paracentesis was performed. The catheter was removed and a dressing was applied. The patient tolerated the procedure well without immediate post procedural complication.  FINDINGS: A total of approximately 1.7 liters of clear, yellow fluid was removed. Samples were sent to the laboratory as requested by the clinical team.  IMPRESSION: Successful ultrasound-guided diagnostic and therapeutic paracentesis yielding 1.7 liters of peritoneal fluid.  Read by: Rowe Robert, PA-C   Electronically Signed   By: Jerilynn Mages.  Shick M.D.   On: 11/20/2014 10:53   US Paracentesis  11/17/2014   INDICATION: ascites  CLINICAL DATA:  Request for therapeutic paracentesis  EXAM: ULTRASOUND-GUIDED PARACENTESIS  ULTRASOUND GUIDED RLQ PARACENTESIS  COMPARISON:  None.  MEDICATIONS: 10 cc 1% lidocaine  COMPLICATIONS: None immediate  TECHNIQUE: Informed written consent was obtained from the patient after a discussion of the risks, benefits and alternatives to treatment. A timeout was performed prior to the initiation of the procedure.  Initial ultrasound scanning demonstrates a large amount of ascites within the right lower abdominal quadrant. The right lower abdomen was prepped and draped in the usual sterile fashion. 1% lidocaine with epinephrine was used for local anesthesia. Under direct ultrasound guidance, a 19 gauge, 7-cm, Yueh catheter was introduced. An ultrasound image  was saved for documentation purposed. the paracentesis was performed. The catheter was removed and a dressing was applied. The patient tolerated the procedure well without immediate post procedural complication.  FINDINGS: A total of approximately 3 liters of serous fluid was removed.  IMPRESSION: Successful ultrasound-guided paracentesis yielding 3 liters of peritoneal fluid.  PROCEDURE: Ultrasound was performed to localize and mark an adequate pocket of fluid in the right quadrant of the abdomen. The area was then prepped and draped in the normal sterile fashion. 1% Lidocaine was used for local anesthesia. Under ultrasound guidance a 19 gauge Yueh catheter was introduced. Paracentesis was performed. The catheter was removed and a dressing applied.  Read by:  Lavonia Drafts [facility]   Electronically Signed   By: Jacqulynn Cadet M.D.   On: 11/17/2014 11:31   US Paracentesis  11/15/2014   CLINICAL DATA:  Ascites, history of pancreatic cancer. Request therapeutic and diagnostic paracentesis of up to 3 L max.  EXAM: ULTRASOUND GUIDED PARACENTESIS  COMPARISON:  None.  PROCEDURE: An ultrasound guided paracentesis was thoroughly discussed with the patient and questions answered. The benefits, risks, alternatives and complications were also discussed. The patient understands and wishes to proceed with the procedure. Written consent was obtained.  Ultrasound was performed to localize and mark an adequate pocket of fluid in the left lower quadrant of the abdomen. The area was then prepped and draped in the normal sterile fashion. 1% Lidocaine was used for local anesthesia. Under ultrasound guidance a 19 gauge Yueh catheter was introduced. Paracentesis was performed. The catheter was removed and a dressing applied.  COMPLICATIONS: None immediate  FINDINGS: A total of approximately 3 L of clear yellow fluid was removed. A fluid sample was sent for laboratory analysis.  IMPRESSION: Successful ultrasound guided paracentesis  yielding 3 L of ascites.  Read by: Ascencion Dike PA-C   Electronically Signed   By: Corrie Mckusick D.O.   On: 11/15/2014 12:55    Labs:  CBC:  Recent Labs  11/19/14 0520 11/20/14 0500 11/21/14 0530 11/22/14 0505  WBC 4.1 5.4 4.9 5.0  HGB 7.0* 9.2* 8.5* 8.8*  HCT 21.8* 27.0* 25.5* 26.9*  PLT 192 196 153 167    COAGS:  Recent Labs  09/15/14 0038 09/17/14 1000  09/30/14 1540 10/02/14 1640 10/03/14 0455 11/14/14 1153  INR 1.52* 1.39  < > 1.51* 1.51* 1.53* 1.60*  APTT 36 48*  --  41*  --   --   --   < > = values in this interval not displayed.  BMP:  Recent Labs  11/19/14 0520 11/20/14 0500 11/21/14 0530 11/22/14 0505  NA 138 139 139 136  K 3.3* 4.1 4.4 4.4  CL 114* 115* 118* 112  CO2 _0 GLUCOSE 93 80 93 85  BUN 6 5* <5* 5*  CALCIUM 7.8* 7.9* 7.6* 7.8*  CREATININE <0.30* <0.30* <0.30* 0.44*  GFRNONAA NOT CALCULATED NOT CALCULATED NOT CALCULATED >90  GFRAA NOT CALCULATED NOT CALCULATED NOT CALCULATED >90    LIVER FUNCTION TESTS:  Recent Labs  11/19/14 0520 11/20/14 0500 11/21/14 0530 11/22/14 0505  BILITOT 8.4* 8.7* 8.0* 8.0*  AST 70* 64* 59* 54*  ALT 37* 35 32 29  ALKPHOS 671* 633* 541* 541*  PROT 6.5 6.5 6.1 6.6  ALBUMIN 2.0* 1.7* 1.4* 1.4*    TUMOR MARKERS:  Recent Labs  03/05/14 1059 08/02/14 1143  CA199 38.3* 23.6    Assessment and Plan:  Melinda Conrad is a 64 yo female with recurrent ascites, I believe from her portal hypertension (HCV/cirrhosis & portal venous thrombus).   At this time she reports her symptoms as not severe.    Melinda Conrad's MELD score prognosticates an 80% 6 month survival, with her Child-Pugh C category prognosticating a 1 year survival of 45%, and 2-year survival of 35%.  While an externalized tunneled catheter would help to alleviate symptoms of ascites accumulation, the loss of protein and electrolytes with significantly decrease her survival -- likely less than 6 months -- a detail that I explicitly  expressed to her and her daughter.    I believe, after my conversation with the family, that a reasonable plan of care at this time would be maximizing medical therapy to decrease the rate of ascites accumulation, while providing intermittent paracentesis for comfort.  If the symptoms worsen or maximum medical therapy fails to palliate her symptoms, either a tunneled external catheter or a Denver Shunt may be considered for end of life comfort.    The patient and the patient's family understand the prognosis and the proposed plan of care, and all questions were answered to the best of my ability.   I have discussed the plan with Dr. Verlon Au of IM and Dr. Deatra Ina of GI, and both are in agreement with the plan of care.  I have also discussed the IR recommendation with Melinda Conrad of Palliative Care team.    IR appreciates all input and assistance with Melinda Conrad's complicated care.    Please contact IR further if we can help with reevaluation or elective paracentesis.    Thank you for this interesting consult.  I greatly enjoyed meeting Melinda Conrad and look forward to participating in their care.  SignedCorrie Mckusick 11/22/2014, 5:25 PM   I spent a total of 55 Miinutes    in face to face in clinical consultation, greater than 50% of which was counseling/coordinating care for recurrent ascites treatment.

## 2014-11-22 NOTE — Progress Notes (Addendum)
Pt's daughter was bedside and braiding pt's hair when I arrived. Another visitor was asleep. Yesterday shared she has pancreatic cancer. Today pt said "well it's not cancer" and she and daughter said together "it's the liver".  We visited for a few minutes as pt recalled our visit from ystrdy. Pt and daughter wanted prayer and I concluded my visit so they could continue with their visit.  Outside of pt rm I conversed w/hospic liaison and learned pt has been referred for hospice services after discharge. I shared my visit as written above and hospice team will follow-up w/pt and attending physician. Chaplain questions whether pt and daughter understand the whole picture given the above comments. Will continue to follow to provide spiritual support. Melinda Conrad Chaplain   11/22/14 1600  Clinical Encounter Type  Visited With Patient and family together

## 2014-11-22 NOTE — Significant Event (Addendum)
  Pathology from 11/20/14 shows scattered atypical cells Called pathologist Dr. Maryagnes Amos reactive mesothelial cells She will stain the cells for further clarification prior to placing drain Appreciate assistance  Verneita Griffes, MD [facility] (P) [phone]

## 2014-11-22 NOTE — Progress Notes (Signed)
PT Cancellation Note  Patient Details Name: Melinda Conrad MRN: [id number] DOB: 1951-03-23   Cancelled Treatment:    Reason Eval/Treat Not Completed: Patient declined, no reason specified;  Pt plans for home with Hospice, comfort care per Palliative Care note; pt has refused PT most of last attempts, pt reports she is getting up with  Nursing staff; PT will sign off, discussed with pt.   [facility] 11/22/2014, 3:04 PM

## 2014-11-22 NOTE — Progress Notes (Signed)
Received referral from [facility] for Hospice and [facility]) services after discharge. Pt seen at bedside, alert oriented x3, able to tell writer she was waiting to talk with the doctors about 'getting the drain placed'; spoke briefly about her understanding of hospice services; she indicated she was not receiving further treatment for her pancreatic cancer, (pt is s/p Whipple) and wanted to focus on comfort at home. Initially this writer was to contact patient's daughter later today however daughter arrived and both daughter and pt spoke with IR - daughter informed this Probation officer that the IR doctor informed them that "holding off on placing a drain could give her more time"; per staff RN Anderson Malta pt was told 1-2 years. Daughter and pt request writer follow up tomorrow after they have had a chance to speak again. Writer discussed above with attending Dr Verlon Au, and left a voice message for Wadie Lessen PMT NP  [facility] Liaison will follow up with primary team and family tomorrow - daughter stated she will be at hospital approximately 3:30-4 pm Danton Sewer RN MSN [facility] Liaison

## 2014-11-23 DIAGNOSIS — R140 Abdominal distension (gaseous): Secondary | ICD-10-CM | POA: Insufficient documentation

## 2014-11-23 LAB — COMPREHENSIVE METABOLIC PANEL
ALBUMIN: 1.4 g/dL — AB (ref 3.5–5.2)
ALT: 32 U/L (ref 0–35)
AST: 58 U/L — AB (ref 0–37)
Alkaline Phosphatase: 517 U/L — ABNORMAL HIGH (ref 39–117)
Anion gap: 3 — ABNORMAL LOW (ref 5–15)
BUN: 6 mg/dL (ref 6–23)
CALCIUM: 7.9 mg/dL — AB (ref 8.4–10.5)
CO2: 21 mmol/L (ref 19–32)
Chloride: 109 mmol/L (ref 96–112)
Creatinine, Ser: 0.49 mg/dL — ABNORMAL LOW (ref 0.50–1.10)
GFR calc Af Amer: >90 mL/min (ref >90)
Glucose, Bld: 73 mg/dL (ref 70–99)
Potassium: 4.2 mmol/L (ref 3.5–5.1)
Sodium: 133 mmol/L — ABNORMAL LOW (ref 135–145)
Total Bilirubin: 8.5 mg/dL — ABNORMAL HIGH (ref 0.3–1.2)
Total Protein: 6.9 g/dL (ref 6.0–8.3)

## 2014-11-23 LAB — NA AND K (SODIUM & POTASSIUM), RAND UR
Potassium Urine: 40 mmol/L
SODIUM UR: 169 mmol/L

## 2014-11-23 LAB — OTHER BODY FLUID CHEMISTRY: Miscellaneous Test Results: 14

## 2014-11-23 MED ORDER — PANTOPRAZOLE SODIUM 40 MG PO TBEC
40.0000 mg | DELAYED_RELEASE_TABLET | Freq: Every day | ORAL | Status: DC
  Administered 2014-11-23 – 2014-11-27 (×5): 40 mg via ORAL
  Filled 2014-11-23 (×8): qty 1

## 2014-11-23 NOTE — Progress Notes (Addendum)
Progress Note from the Palliative Medicine Team at [place]:   -pateint is resting comfortably, reports her pain is "much better" with the  increase of ER Morphine  -f/u to offer emotional support, patient tells me the doctors have told her "I can go home and get back to how I use to be, I will live for 2 more years"  -at this time she is now open to all offered and available medical interventions to prolong life   Objective: No Known Allergies Scheduled Meds: . amoxicillin-clavulanate  1 tablet Oral Q12H  . antiseptic oral rinse  7 mL Mouth Rinse q12n4p  . chlorhexidine  15 mL Mouth Rinse BID  . feeding supplement (RESOURCE BREEZE)  1 Container Oral TID BM  . furosemide  40 mg Oral Daily  . lactulose  300 mL Rectal Once  . lactulose  300 mL Rectal Once  . OxyCODONE  15 mg Oral Q12H  . pantoprazole (PROTONIX) IV  40 mg Intravenous Q24H  . potassium chloride  40 mEq Oral BID  . sodium chloride  3 mL Intravenous Q12H  . spironolactone  100 mg Oral Daily   Continuous Infusions:   PRN Meds:.acetaminophen, albuterol, bisacodyl, hydrALAZINE, morphine injection, ondansetron (ZOFRAN) IV, oxyCODONE, sodium chloride  BP 115/66 mmHg  Pulse 84  Temp(Src) 98.3 F (36.8 C) (Oral)  Resp 16  Ht 5\' 3"  (1.6 m)  Wt 60.5 kg (133 lb 6.1 oz)  BMI 23.63 kg/m2  SpO2 100%   PPS:30 % at best     Intake/Output Summary (Last 24 hours) at 11/23/14 0924 Last data filed at 11/23/14 0556  Gross per 24 hour  Intake   2100 ml  Output   2900 ml  Net   -800 ml       Physical Exam:  General: chronically ill appearing  NAD HEENT:  Moist buccal  Membranes, no exudate Chest:   Decreased in bases CVS: RRR Abdomen: distended, firm and tender Ext: BLE +2 edema  Labs: CBC    Component Value Date/Time   WBC 5.0 11/22/2014 0505   WBC 4.2 06/22/2014 0935   RBC 2.99* 11/22/2014 0505   RBC 4.37 06/22/2014 0935   HGB 8.8* 11/22/2014 0505   HGB 13.7 06/22/2014 0935   HCT 26.9*  11/22/2014 0505   HCT 40.1 06/22/2014 0935   PLT 167 11/22/2014 0505   PLT 168 06/22/2014 0935   MCV 90.0 11/22/2014 0505   MCV 91.8 06/22/2014 0935   MCH 29.4 11/22/2014 0505   MCH 31.4 06/22/2014 0935   MCHC 32.7 11/22/2014 0505   MCHC 34.2 06/22/2014 0935   RDW 23.8* 11/22/2014 0505   RDW 15.2* 06/22/2014 0935   LYMPHSABS 0.8 11/22/2014 0505   LYMPHSABS 0.5* 06/22/2014 0935   MONOABS 0.6 11/22/2014 0505   MONOABS 0.4 06/22/2014 0935   EOSABS 0.2 11/22/2014 0505   EOSABS 0.1 06/22/2014 0935   BASOSABS 0.0 11/22/2014 0505   BASOSABS 0.0 06/22/2014 0935    BMET    Component Value Date/Time   NA 136 11/22/2014 0505   NA 141 06/22/2014 0935   K 4.4 11/22/2014 0505   K 4.1 06/22/2014 0935   CL 112 11/22/2014 0505   CO2 21 11/22/2014 0505   CO2 22 06/22/2014 0935   GLUCOSE 85 11/22/2014 0505   GLUCOSE 79 06/22/2014 0935   BUN 5* 11/22/2014 0505   BUN 10.7 06/22/2014 0935   CREATININE 0.44* 11/22/2014 0505   CREATININE 0.9 06/22/2014 0935   CALCIUM 7.8*  11/22/2014 0505   CALCIUM 9.6 06/22/2014 0935   GFRNONAA >90 11/22/2014 0505   GFRAA >90 11/22/2014 0505    CMP     Component Value Date/Time   NA 136 11/22/2014 0505   NA 141 06/22/2014 0935   K 4.4 11/22/2014 0505   K 4.1 06/22/2014 0935   CL 112 11/22/2014 0505   CO2 21 11/22/2014 0505   CO2 22 06/22/2014 0935   GLUCOSE 85 11/22/2014 0505   GLUCOSE 79 06/22/2014 0935   BUN 5* 11/22/2014 0505   BUN 10.7 06/22/2014 0935   CREATININE 0.44* 11/22/2014 0505   CREATININE 0.9 06/22/2014 0935   CALCIUM 7.8* 11/22/2014 0505   CALCIUM 9.6 06/22/2014 0935   PROT 6.6 11/22/2014 0505   PROT 8.7* 06/08/2014 1013   ALBUMIN 1.4* 11/22/2014 0505   ALBUMIN 3.4* 06/08/2014 1013   AST 54* 11/22/2014 0505   AST 24 06/08/2014 1013   ALT 29 11/22/2014 0505   ALT 15 06/08/2014 1013   ALKPHOS 541* 11/22/2014 0505   ALKPHOS 89 06/08/2014 1013   BILITOT 8.0* 11/22/2014 0505   BILITOT 0.47 06/08/2014 1013   GFRNONAA >90  11/22/2014 0505   GFRAA >90 11/22/2014 0505      Assessment and Plan: 1. Code Status: DNR/DNI - prolonging life is focus of care             recommend dc foley now with shift in foocus   2. Symptom Control: Pain Oxycontin Er 15 mg every 12 hr  Oxycodone IR 5 mg every 3 hrs prn  3. Psycho/Social:  Emotional support offered to patient at bedside. Educated patient  to continue to verbalize her values and goals of care and how it relates to her treatment options.   4. Spiritual  Chaplain involved  5    Disposition:  Discharge home when medically stable    Time In Time Out Total Time Spent with Patient Total Overall Time  0800 0820 20 min 20 min    Greater than 50%  of this time was spent counseling and coordinating care related to the above assessment and plan.  Wadie Lessen NP  Palliative Medicine Team Team Phone # [phone] Pager [phone]   1

## 2014-11-23 NOTE — Progress Notes (Addendum)
Follow-up:  The pt's case was reviewed earlier today with [place] Director who also spoke with Dr Benay Spice pt's oncologist. At this time the pt does not meet eligibility for hospice services (prognosis < 6 months) as there is not evidence of active cancer, and per chart notes/ discussion of GI physicians and IR physicians ongoing care for liver disease/ascites may also provide pt with a prognosis of greater than 6 months. Writer was to follow up today with pt/daughter regarding hospice services.  Pt's daughter, not in room however several family members including son and adult granddaughter at bedside, at pt's request, only, son and granddaughter met w pt and this Probation officer, hospice RN Lattie Haw and [employer] . Son questioned who had called hospice in; Probation officer recapped for son, with pt providing additional comments to inform that after pt had spoken to her doctors: primary team as well as GI physician, the PMT NP, she is still considering whether or not to get the drain; patient confirmed that she wants to do everything she can and is open to options/interventions available to her . Pt and family aware that [facility] can be contacted, at any time, for re-evaluation. They were appreciative of information and have the [facility] number should situation or decisions change. Thank you. Danton Sewer RN MSN [facility] Liaison

## 2014-11-23 NOTE — Progress Notes (Signed)
[facility]                                                                              64 y.o.? adenocarcinoma of head of pancreas, status post Whipple's procedure 08/07/14, completed concurrent capecitabine and radiation, poor performance status and hence did not get adjuvant chemotherapy, recurrent Klebsiella bacteremia- attributed to intra-abdominal source/presumed septic portal vein thrombus, acute portal vein thrombosis-not on anticoagulation secondary to GI bleed, tobacco abuse & hepatitis C presented to the [facility] ED on 11/14/14 with complaints of progressive mental status changes, abdominal distention/pain and poor appetite. gradually worsening confusion, disorientation and lethargic to a point of near unresponsiveness decreased oral intake of liquids and food but no nausea or vomiting, progressive abdominal and lower extremity swelling with intermittent abdominal pain and no fevers.  Was on IV antibiotics through right upper extremity PICC line and therefore plans to remove PICC line and from IV Unasyn to oral cefuroxime.   on admission somnolent but arousable, afebrile, elevated blood pressure, lab work significant for potassium <2, abnormal LFTs, ammonia 120, hemoglobin 8.5, CT head without acute findings, CT abdomen and pelvis: Diffuse portal vein system thrombosis, anasarca, diffuse moderate to severe bowel wall thickening, moderate bilateral pleural effusions and fluid filled and dilated small bowel loop draining the pancreas. Hospitalist admission was requested. Since admission Patient has had paracentesis twice. Patient continues to develop fluid and requires further tapping. Discussed with radiology possible catheter placement versus [place] shunt.  Patient still trying to make sense of decisions and come to a conclusion as to what type of therapy she wants Multiple consultants have discussed with patient and multiple days options and she is coming to a  decision.  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 -CT of the head: No acute findings -Blood cultures: no growth to date -Gastroenterology consulted and appreciated -Ammonia level upon admission 120, now 92.  Anasarca/ascites -Secondary to liver disease, portal vein thrombosis and hypoalbuminemia with malnutrition -Interventional radiology consult appreciated -Patient had ultrasound-guided paracentesis with removal of 3 L (11/15/2014) -Patient empirically started on IV Unasyn 2/24 which was tapered on 3/3 2 Augmentin to cover for possible SBP-stop date 11/25/14 which will be 10 days of treatment -Spoke with pathology: Fluid cytology shows atypical cells--+ 4 Caklretinin and MT-1-These are mesothelial cells these are likely to represent a cancer?-I will discuss with pathologist in the a.m. as no one on-call right now -Fluid culture: no growth to date, no organisms seen -Fluid chemistry not consistent with SBP -SAAG unable to accurately calculate as patient's ascitic albumin was not a specific number, rather <1.   -Patient continues to have a firm abdomen with pain- US showed significant ascites on 11/16/2014 -Repeat US paracentesis on 11/17/2014 removed 3L- however patient's abdomen looks distended and firm -Dr. Maye Hides D/W family 11/17/2014, regarding prognosis and Code status. Palliative care consulted and appreciated -Long discussion with family as well as with patient and with palliative care as well as with interventional radiology totaling >35 minutes of time Patient is not sure what she wants . I have spent a long time with the daughter and the patient discussing the risks  and benefits of indwelling Pleurx drain which will offer comfort if that is what she so desires   patient was also offered the option of diuretic therapy for her ascites and intermittent paracentesis  For now-strict I's and O's, keep indwelling Foley  catheter, daily weights Urine/sodium ratio = 4.2--> indicative of the fact that the patient is not holding onto sodium--maybe has room to increase Lasix further but slowly. Currently she is still +5 L positive down from +7 L positive yesterday-her weight has dropped from 138-130 pounds We will continue to diureses her slowly and make changes as per discussion with gastroenterology  Previous Klebsiella and streptococcal bacteremia -ID consulted and appreciated -Patient needs to complete 42 day course of antibiotics which should have ended 11/27/14-ID recommended Cefuroxime 500mg  BID  -She completes Augmentin on 3/6 which should be enough -Thus far blood cultures and fluid cultures negative  Severe hypokalemia -Magnesium 1.9 -Resolved, will continue to monitor in place as needed   Dehydration -Secondary to poor oral intake and continue IV fluids  Portal vein thrombosis -Patient has had history of GI bleed is currently not anticoagulation candidate  Anemia secondary to chronic disease -Baseline approximately 8 -Hemoglobin dropped to 7.0, patient was transfused 1 unit on 2/29, hemoglobin stable between 8-9 range-Continue to monitor CBC  Hepatitis C -Currently not on treatment  Severe protein calorie malnutrition/failure to thrive -Nutrition consulted and appreciated -Patient started on clear liquid diet with nutritional supplementation  Pancreatic adenocarcinoma -S/p Whipple's procedure  ?Small bowel ileus  resolved and improved- She will start on a regular diet tomorrow 3/5 as she tolerated diet with clear liquids and soft diet 3/3  Code Status: Full  Family Communication: discussed with husband at bedside  Disposition Plan: patient is currently now DNR/DNI and it is recommended to the patient to pursue a palliative care trajectory   see above note  Time Spent in minutes   25 minutes  Procedures  Ultrasound guided paracentesis 2/25 and 2/27  Consults   Interventional  radiology Gastroenterology General surgery Oncology Infectious Disease Palliative care  DVT Prophylaxis  SCDs  Lab Results  Component Value Date   PLT 167 11/22/2014    Medications  Scheduled Meds: . amoxicillin-clavulanate  1 tablet Oral Q12H  . antiseptic oral rinse  7 mL Mouth Rinse q12n4p  . chlorhexidine  15 mL Mouth Rinse BID  . feeding supplement (RESOURCE BREEZE)  1 Container Oral TID BM  . furosemide  40 mg Oral Daily  . lactulose  300 mL Rectal Once  . lactulose  300 mL Rectal Once  . OxyCODONE  15 mg Oral Q12H  . pantoprazole  40 mg Oral Daily  . potassium chloride  40 mEq Oral BID  . sodium chloride  3 mL Intravenous Q12H  . spironolactone  100 mg Oral Daily   Continuous Infusions:   PRN Meds:.acetaminophen, albuterol, bisacodyl, hydrALAZINE, morphine injection, ondansetron (ZOFRAN) IV, oxyCODONE, sodium chloride  Antibiotics    Anti-infectives    Start     Dose/Rate Route Frequency Ordered Stop   11/22/14 2200  amoxicillin-clavulanate (AUGMENTIN) 875-125 MG per tablet 1 tablet     1 tablet Oral Every 12 hours 11/22/14 1859     11/15/14 0000  Ampicillin-Sulbactam (UNASYN) 3 g in sodium chloride 0.9 % 100 mL IVPB  Status:  Discontinued     3 g 100 mL/hr over 60 Minutes Intravenous 4 times per day 11/14/14 1616 11/22/14 1859   11/14/14 1615  Ampicillin-Sulbactam (UNASYN) 3 g in sodium chloride  0.9 % 100 mL IVPB     3 g 100 mL/hr over 60 Minutes Intravenous STAT 11/14/14 1609 11/14/14 1827   11/14/14 1600  ampicillin-sulbactam (UNASYN) 1.5 g in sodium chloride 0.9 % 50 mL IVPB  Status:  Discontinued     1.5 g 100 mL/hr over 30 Minutes Intravenous  Once 11/14/14 1527 11/14/14 1609        Subjective:   Looks well, brighter Still confused as to what is the best course of option for her No chest pain no nausea no vomiting no shortness breath No fever no chills  Objective:   Filed Vitals:   11/23/14 0035 11/23/14 0500 11/23/14 0546 11/23/14 1400   BP:   115/66 117/84  Pulse:   84 85  Temp:   98.3 F (36.8 C) 98 F (36.7 C)  TempSrc:   Oral Oral  Resp:   16 18  Height:      Weight: 60.5 kg (133 lb 6.1 oz) 60.5 kg (133 lb 6.1 oz)    SpO2:   100% 100%    Wt Readings from Last 3 Encounters:  11/23/14 60.5 kg (133 lb 6.1 oz)  10/29/14 50.395 kg (111 lb 1.6 oz)  10/17/14 45.6 kg (100 lb 8.5 oz)     Intake/Output Summary (Last 24 hours) at 11/23/14 1718 Last data filed at 11/23/14 1400  Gross per 24 hour  Intake   1460 ml  Output   3300 ml  Net  -1840 ml    Exam  General: Well developed, thin, cachectic , temporalis wasting, supraclavicular wasting   Cardiovascular: S1 S2 auscultated,RRR  Respiratory: Diminished breath sounds  Abdomen: Firm, distended, diffusely tender, hypoactive bowel sounds   lower extremities are swollen    Data Review   Micro Results Recent Results (from the past 240 hour(s))  Blood culture (routine x 2)     Status: None   Collection Time: 11/14/14 12:30 PM  Result Value Ref Range Status   Specimen Description BLOOD RIGHT HAND  Final   Special Requests BOTTLES DRAWN AEROBIC ONLY 4CC  Final   Culture   Final    NO GROWTH 5 DAYS Performed at [facility]    Report Status 11/20/2014 FINAL  Final  MRSA PCR Screening     Status: None   Collection Time: 11/14/14  5:15 PM  Result Value Ref Range Status   MRSA by PCR NEGATIVE NEGATIVE Final    Comment:        The GeneXpert MRSA Assay (FDA approved for NASAL specimens only), is one component of a comprehensive MRSA colonization surveillance program. It is not intended to diagnose MRSA infection nor to guide or monitor treatment for MRSA infections.   Body fluid culture     Status: None   Collection Time: 11/15/14 12:10 PM  Result Value Ref Range Status   Specimen Description ASCITIC  Final   Special Requests NONE  Final   Gram Stain   Final    RARE WBC PRESENT, PREDOMINANTLY PMN NO ORGANISMS SEEN Performed at [facility]    Culture   Final    NO GROWTH 3 DAYS Performed at [facility]    Report Status 11/18/2014 FINAL  Final  Anaerobic culture     Status: None   Collection Time: 11/15/14 12:10 PM  Result Value Ref Range Status   Specimen Description FLUID ASCITIES  Final   Special Requests NONE  Final   Gram Stain   Final  RARE WBC PRESENT, PREDOMINANTLY PMN NO ORGANISMS SEEN Performed at [facility]    Culture   Final    NO ANAEROBES ISOLATED Performed at [facility]    Report Status 11/20/2014 FINAL  Final    Radiology Reports Dg Abd 1 View  11/16/2014   CLINICAL DATA:  Abdominal pain and distention.  EXAM: ABDOMEN - 1 VIEW  COMPARISON:  CT on 11/14/2014  FINDINGS: Chronic and bowel loops in the central abdomen is consistent with ascites. Surgical clips and piece of catheter or stent again seen in right upper quadrant. Mild dilatation of small bowel loops and colon is seen in the central abdomen likely due to on adynamic ileus. Some bowel gas in contrast is seen in the rectum.  IMPRESSION: Ascites and probable adynamic ileus.   Electronically Signed   By: Earle Gell M.D.   On: 11/16/2014 16:06   Ct Head Wo Contrast  11/14/2014   CLINICAL DATA:  Altered mental status  EXAM: CT HEAD WITHOUT CONTRAST  TECHNIQUE: Contiguous axial images were obtained from the base of the skull through the vertex without intravenous contrast.  COMPARISON:  October 27, 2005  FINDINGS: The ventricles are normal in size and configuration. There is no mass, hemorrhage, extra-axial fluid collection, or midline shift. Gray-white compartments are normal. No acute infarct apparent. The bony calvarium appears intact. The mastoid air cells are clear.  IMPRESSION: No intracranial mass, hemorrhage, or focal gray -white compartment lesions/acute appearing infarct.   Electronically Signed   By: Lowella Grip III M.D.   On: 11/14/2014 14:17   US Abdomen Complete  11/14/2014   CLINICAL DATA:   Pancreatic cancer post Whipple procedure 08/07/2014, abdominal swelling for 1 week, prior cholecystectomy, hypertension, radiation therapy, smoker  EXAM: ULTRASOUND ABDOMEN COMPLETE  COMPARISON:  CT abdomen and pelvis 10/16/2014  FINDINGS: Gallbladder: Surgically absent  Common bile duct: Diameter: 5 mm diameter, normal  Liver: Heterogeneous in echogenicity. No discrete mass. Portal vein is not well demonstrated ; portal vein demonstrated thrombosis on the prior CT.  IVC: Intrahepatic portion normal appearance. Infrahepatic portion nonvisualized due to bowel gas.  Pancreas: Post Whipple procedure by history. Pancreatic bed inadequately visualized due to bowel gas.  Spleen: Normal appearance, 5.0 cm length  Right Kidney: Length: 9.3 cm. Normal morphology without mass or hydronephrosis.  Left Kidney: Length: 10.0 cm. Normal morphology without mass or hydronephrosis.  Abdominal aorta: Proximally normal caliber, remainder obscured by bowel gas  Other findings: Significant ascites throughout abdomen and pelvis.  IMPRESSION: Significant ascites significantly increased since prior CT exam, likely cause of abdominal swelling.  Limited exam due to bowel gas, with inadequate visualization of the pancreatic bed, portal vein and portions of the aorta.  Post cholecystectomy.   Electronically Signed   By: Lavonia Dana M.D.   On: 11/14/2014 14:32   Ct Abdomen Pelvis W Contrast  11/14/2014   CLINICAL DATA:  64 year old female with abdominal pain and distension. Current history of pancreatic cancer status post Whipple and radiation. Subsequent encounter.  EXAM: CT ABDOMEN AND PELVIS WITH CONTRAST  TECHNIQUE: Multidetector CT imaging of the abdomen and pelvis was performed using the standard protocol following bolus administration of intravenous contrast.  CONTRAST:  170mL OMNIPAQUE IOHEXOL 300 MG/ML  SOLN  COMPARISON:  10/16/2014 and earlier.  FINDINGS: New moderate size bilateral pleural effusions with lower lobe atelectasis. No  pericardial effusion.  Advanced degenerative changes in the lower lumbar spine. No acute osseous abnormality identified.  Increased body wall stranding and  density in keeping with a degree of anasarca. Moderate to large volume of ascites in the abdomen has significantly progressed, and in the upper abdomen this is associated with new lobular mass effect on the liver contour (series 2, image 17).  No dilated large bowel. Widespread small and large bowel wall thickening. Pancreatic or biliary stent re- identified within a small bowel loop in the right upper quadrant which today is dilated with fluid up to 4 cm diameter. There is continued intra and extrahepatic biliary ductal dilatation. Pancreatic tail atrophy again noted.  In January of the main portal vein was thrombosed. On today images no portal venous system enhancement is identified.  The stomach is thick walled. Oral contrast was administered, and has reached mid small bowel loops.  Major arterial structures in the abdomen and pelvis appear patent. Aortoiliac calcified atherosclerosis noted. Renal enhancement and contrast excretion is within normal limits.  IMPRESSION: 1. Progression of disease. Progressed and now diffuse portal venous system thrombosis since January. 2. Anasarca. Large volume ascites, mass effect on the liver may reflect subcapsular or loculated ascites. Diffuse moderate to severe bowel wall thickening. Moderate bilateral pleural effusions. 3. Fluid-filled and dilated small bowel loop draining the pancreas.   Electronically Signed   By: Genevie Ann M.D.   On: 11/14/2014 14:42   US Abdomen Limited  11/16/2014   CLINICAL DATA:  Abdominal distention question ascites, history Whipple procedure, hypertension, pancreatic cancer  EXAM: LIMITED ABDOMEN ULTRASOUND FOR ASCITES  TECHNIQUE: Limited ultrasound survey for ascites was performed in all four abdominal quadrants.  COMPARISON:  11/14/2014 CT abdomen and pelvis  FINDINGS: Significant ascites is  identified in both lower quadrants.  Smaller amount of ascites is seen in the LEFT upper quadrant, with even less in the perihepatic region.  IMPRESSION: Significant ascites.   Electronically Signed   By: Lavonia Dana M.D.   On: 11/16/2014 17:34   US Paracentesis  11/20/2014   INDICATION: Pancreatic cancer, hepatitis-C, ascites. Request is made for diagnostic and therapeutic paracentesis.  EXAM: ULTRASOUND-GUIDED DIAGNOSTIC AND THERAPEUTIC PARACENTESIS  COMPARISON:  Prior paracentesis on 11/17/2014  MEDICATIONS: None.  COMPLICATIONS: None immediate  TECHNIQUE: Informed written consent was obtained from the patient after a discussion of the risks, benefits and alternatives to treatment. A timeout was performed prior to the initiation of the procedure.  Initial ultrasound scanning demonstrates a small amount of ascites within the right lower abdominal quadrant. The right lower abdomen was prepped and draped in the usual sterile fashion. 1% lidocaine was used for local anesthesia. Under direct ultrasound guidance, a 19 gauge, 7-cm, Yueh catheter was introduced. An ultrasound image was saved for documentation purposed. The paracentesis was performed. The catheter was removed and a dressing was applied. The patient tolerated the procedure well without immediate post procedural complication.  FINDINGS: A total of approximately 1.7 liters of clear, yellow fluid was removed. Samples were sent to the laboratory as requested by the clinical team.  IMPRESSION: Successful ultrasound-guided diagnostic and therapeutic paracentesis yielding 1.7 liters of peritoneal fluid.  Read by: Rowe Robert, PA-C   Electronically Signed   By: Jerilynn Mages.  Shick M.D.   On: 11/20/2014 10:53   US Paracentesis  11/17/2014   INDICATION: ascites  CLINICAL DATA:  Request for therapeutic paracentesis  EXAM: ULTRASOUND-GUIDED PARACENTESIS  ULTRASOUND GUIDED RLQ PARACENTESIS  COMPARISON:  None.  MEDICATIONS: 10 cc 1% lidocaine  COMPLICATIONS: None immediate   TECHNIQUE: Informed written consent was obtained from the patient after a discussion of the risks, benefits  and alternatives to treatment. A timeout was performed prior to the initiation of the procedure.  Initial ultrasound scanning demonstrates a large amount of ascites within the right lower abdominal quadrant. The right lower abdomen was prepped and draped in the usual sterile fashion. 1% lidocaine with epinephrine was used for local anesthesia. Under direct ultrasound guidance, a 19 gauge, 7-cm, Yueh catheter was introduced. An ultrasound image was saved for documentation purposed. the paracentesis was performed. The catheter was removed and a dressing was applied. The patient tolerated the procedure well without immediate post procedural complication.  FINDINGS: A total of approximately 3 liters of serous fluid was removed.  IMPRESSION: Successful ultrasound-guided paracentesis yielding 3 liters of peritoneal fluid.  PROCEDURE: Ultrasound was performed to localize and mark an adequate pocket of fluid in the right quadrant of the abdomen. The area was then prepped and draped in the normal sterile fashion. 1% Lidocaine was used for local anesthesia. Under ultrasound guidance a 19 gauge Yueh catheter was introduced. Paracentesis was performed. The catheter was removed and a dressing applied.  Read by:  Lavonia Drafts [facility]   Electronically Signed   By: Jacqulynn Cadet M.D.   On: 11/17/2014 11:31   US Paracentesis  11/15/2014   CLINICAL DATA:  Ascites, history of pancreatic cancer. Request therapeutic and diagnostic paracentesis of up to 3 L max.  EXAM: ULTRASOUND GUIDED PARACENTESIS  COMPARISON:  None.  PROCEDURE: An ultrasound guided paracentesis was thoroughly discussed with the patient and questions answered. The benefits, risks, alternatives and complications were also discussed. The patient understands and wishes to proceed with the procedure. Written consent was obtained.  Ultrasound was performed to  localize and mark an adequate pocket of fluid in the left lower quadrant of the abdomen. The area was then prepped and draped in the normal sterile fashion. 1% Lidocaine was used for local anesthesia. Under ultrasound guidance a 19 gauge Yueh catheter was introduced. Paracentesis was performed. The catheter was removed and a dressing applied.  COMPLICATIONS: None immediate  FINDINGS: A total of approximately 3 L of clear yellow fluid was removed. A fluid sample was sent for laboratory analysis.  IMPRESSION: Successful ultrasound guided paracentesis yielding 3 L of ascites.  Read by: Ascencion Dike PA-C   Electronically Signed   By: Corrie Mckusick D.O.   On: 11/15/2014 12:55    CBC  Recent Labs Lab 11/18/14 0545 11/19/14 0520 11/20/14 0500 11/21/14 0530 11/22/14 0505  WBC 5.7 4.1 5.4 4.9 5.0  HGB 7.6* 7.0* 9.2* 8.5* 8.8*  HCT 23.2* 21.8* 27.0* 25.5* 26.9*  PLT 219 192 196 153 167  MCV 85.9 86.5 86.8 89.2 90.0  MCH 28.1 27.8 29.6 29.7 29.4  MCHC 32.8 32.1 34.1 33.3 32.7  RDW 23.9* 24.3* 22.8* 23.3* 23.8*  LYMPHSABS  --   --   --   --  0.8  MONOABS  --   --   --   --  0.6  EOSABS  --   --   --   --  0.2  BASOSABS  --   --   --   --  0.0    Chemistries   Recent Labs Lab 11/19/14 0520 11/20/14 0500 11/21/14 0530 11/22/14 0505 11/23/14 0850  NA 138 139 139 136 133*  K 3.3* 4.1 4.4 4.4 4.2  CL 114* 115* 118* 112 109  CO2 20 19 19 21 21   GLUCOSE 93 80 93 85 73  BUN 6 5* <5* 5* 6  CREATININE <0.30* <0.30* <  0.30* 0.44* 0.49*  CALCIUM 7.8* 7.9* 7.6* 7.8* 7.9*  AST 70* 64* 59* 54* 58*  ALT 37* 35 32 29 32  ALKPHOS 671* 633* 541* 541* 517*  BILITOT 8.4* 8.7* 8.0* 8.0* 8.5*   ------------------------------------------------------------------------------------------------------------------ estimated creatinine clearance is 61.1 mL/min (by C-G formula based on Cr of  0.49). ------------------------------------------------------------------------------------------------------------------ No results for input(s): HGBA1C in the last 72 hours. ------------------------------------------------------------------------------------------------------------------ No results for input(s): CHOL, HDL, LDLCALC, TRIG, CHOLHDL, LDLDIRECT in the last 72 hours. ------------------------------------------------------------------------------------------------------------------ No results for input(s): TSH, T4TOTAL, T3FREE, THYROIDAB in the last 72 hours.  Invalid input(s): FREET3 ------------------------------------------------------------------------------------------------------------------ No results for input(s): VITAMINB12, FOLATE, FERRITIN, TIBC, IRON, RETICCTPCT in the last 72 hours.  Coagulation profile No results for input(s): INR, PROTIME in the last 168 hours.  No results for input(s): DDIMER in the last 72 hours.  Cardiac Enzymes No results for input(s): CKMB, TROPONINI, MYOGLOBIN in the last 168 hours.  Invalid input(s): CK ------------------------------------------------------------------------------------------------------------------ Invalid input(s): POCBNP  Verneita Griffes, MD [facility] [phone]

## 2014-11-23 NOTE — Progress Notes (Signed)
Pt's son requested information on pt's status. Pt gave verbal consent to share information. Writer explained that the Hospice nurse will be in to further discuss pt status.

## 2014-11-23 NOTE — Progress Notes (Signed)
    Progress Note   Subjective  intermittent, naggin abdominal discomfort   Objective   Vital signs in last 24 hours: Temp:  [97.8 F (36.6 C)-98.3 F (36.8 C)] 98.3 F (36.8 C) (03/04 0546) Pulse Rate:  [58-84] 84 (03/04 0546) Resp:  [16] 16 (03/04 0546) BP: (102-115)/(62-69) 115/66 mmHg (03/04 0546) SpO2:  [100 %] 100 % (03/04 0546) Weight:  [133 lb 6.1 oz (60.5 kg)] 133 lb 6.1 oz (60.5 kg) (03/04 0500) Last BM Date: 11/22/14 General:    black female in NAD Abdomen:  Soft, mild-moderately distended. Mild diffuse tenderness Normal bowel sounds. Extremities:  Pitting edema of BLE. Neurologic:  Alert and oriented,  grossly normal neurologically. Psych:  Cooperative. Normal mood and affect.  Lab Results:  Recent Labs  11/21/14 0530 11/22/14 0505  WBC 4.9 5.0  HGB 8.5* 8.8*  HCT 25.5* 26.9*  PLT 153 167   BMET  Recent Labs  11/21/14 0530 11/22/14 0505  NA 139 136  K 4.4 4.4  CL 118* 112  CO2 19 21  GLUCOSE 93 85  BUN <5* 5*  CREATININE <0.30* 0.44*  CALCIUM 7.6* 7.8*   LFT  Recent Labs  11/22/14 0505  PROT 6.6  ALBUMIN 1.4*  AST 54*  ALT 29  ALKPHOS 541*  BILITOT 8.0*      Assessment / Plan:    64 year old female with pancreatic cancer, s/p whipple Nov 2015. Post-op course complicated by recurrent hospitalizations for bacteremia, PV thrombosis and general decline. Now with ascites, likely secondary to portal HTN but malignancy not excluded. Cytology reveals atypical cells. Diuretics have been initiated, hopefully this will slow down rate of ascites accumulation. If not, IR will re-evaluate for Denver shunt or tunneled external catheter.  Will need to monitor renal function on diuretics    LOS: 9 days   Tye Savoy  11/23/2014, 9:20 AM   I/O -880 from yesterday.  Patient states her abdomen feels soft.  Plan to continue diuretic therapy.  Goal is to prevent reaccumulation of ascites.  If she continues negative will reduce diuretics.

## 2014-11-23 NOTE — Progress Notes (Signed)
Pharmacy - Brief Note IV to PO pantoprazole  The patient is receiving Protonix by the intravenous route.  Based on criteria approved by the Pharmacy and [place], the medication is being converted to the equivalent oral dose form.  These criteria include: -No Active GI bleeding -Able to tolerate diet of full liquids (or better) or tube feeding -Able to tolerate other medications by the oral or enteral route  If you have any questions about this conversion, please contact the Pharmacy Department (phone [phone]).  Thank you.   Doreene Eland, PharmD, BCPS.   Pager: [phone] 11/23/2014 12:14 PM

## 2014-11-24 LAB — BASIC METABOLIC PANEL
Anion gap: 5 (ref 5–15)
BUN: 6 mg/dL (ref 6–23)
CO2: 21 mmol/L (ref 19–32)
CREATININE: 0.62 mg/dL (ref 0.50–1.10)
Calcium: 7.7 mg/dL — ABNORMAL LOW (ref 8.4–10.5)
Chloride: 103 mmol/L (ref 96–112)
GFR calc Af Amer: >90 mL/min (ref >90)
GFR calc non Af Amer: >90 mL/min (ref >90)
Glucose, Bld: 73 mg/dL (ref 70–99)
Potassium: 3.8 mmol/L (ref 3.5–5.1)
Sodium: 129 mmol/L — ABNORMAL LOW (ref 135–145)

## 2014-11-24 NOTE — Progress Notes (Signed)
[facility]                                                                              64 y.o.? adenocarcinoma of head of pancreas, status post Whipple's procedure 08/07/14, completed concurrent capecitabine and radiation, poor performance status and hence did not get adjuvant chemotherapy, recurrent Klebsiella bacteremia- attributed to intra-abdominal source/presumed septic portal vein thrombus, acute portal vein thrombosis-not on anticoagulation secondary to GI bleed, tobacco abuse & hepatitis C presented to the [facility] ED on 11/14/14 with complaints of progressive mental status changes, abdominal distention/pain and poor appetite. gradually worsening confusion, disorientation and lethargic to a point of near unresponsiveness decreased oral intake of liquids and food but no nausea or vomiting, progressive abdominal and lower extremity swelling with intermittent abdominal pain and no fevers.  Was on IV antibiotics through right upper extremity PICC line and therefore plans to remove PICC line and from IV Unasyn to oral cefuroxime.   on admission somnolent but arousable, afebrile, elevated blood pressure, lab work significant for potassium <2, abnormal LFTs, ammonia 120, hemoglobin 8.5, CT head without acute findings, CT abdomen and pelvis: Diffuse portal vein system thrombosis, anasarca, diffuse moderate to severe bowel wall thickening, moderate bilateral pleural effusions and fluid filled and dilated small bowel loop draining the pancreas. Hospitalist admission was requested. Since admission Patient has had paracentesis twice. Patient continues to develop fluid and requires further tapping. Discussed with radiology possible catheter placement versus Denver shunt.  Patient still trying to make sense of decisions and come to a conclusion as to what type of therapy she wants Multiple consultants have discussed with patient and multiple days options and she is coming to a  decision.  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 -CT of the head: No acute findings -Blood cultures: no growth to date -Gastroenterology consulted and appreciated -Ammonia level upon admission 120, now 92.  Anasarca/ascites -Secondary to liver disease, portal vein thrombosis and hypoalbuminemia with malnutrition -Interventional radiology consult appreciated -Patient had ultrasound-guided paracentesis with removal of 3 L (11/15/2014) -Patient empirically started on IV Unasyn 2/24 which was tapered on 3/3 2 Augmentin to cover for possible SBP-stop date 11/25/14 which will be 10 days of treatment -Spoke with pathology: Fluid cytology shows atypical cells--+ 4 Caklretinin and MT-1-These are mesothelial cells -Fluid culture: no growth to date, no organisms seen -Fluid chemistry not consistent with SBP -SAAG unable to accurately calculate as patient's ascitic albumin was not a specific number, rather <1.   -Patient continues to have a firm abdomen with pain- US showed significant ascites on 11/16/2014 -Repeat US paracentesis on 11/17/2014 removed 3L- however patient's abdomen looks distended and firm -Dr. Maye Hides D/W family 11/17/2014, regarding prognosis and Code status. Palliative care consulted and appreciated -Long discussion with family as well as with patient and with palliative care as well as with interventional radiology totaling >35 minutes of time Patient is not sure what she wants . I have spent a long time with the daughter and the patient discussing the risks and benefits of indwelling Pleurx drain which will offer comfort if that is what she so desires   patient  was also offered the option of diuretic therapy for her ascites and intermittent paracentesis  For now-strict I's and O's, keep indwelling Foley catheter, daily weights Urine/sodium ratio = 4.2--> indicative of the fact that the patient is not holding onto  sodium- Sodium 129 Rpt labs am-if Na+ drops further, would then consider halving dose of lasix 40-->20  We will continue to diureses her slowly and make changes as per discussion with gastroenterology  Previous Klebsiella and streptococcal bacteremia -ID consulted and appreciated -Patient needs to complete 42 day course of antibiotics which should have ended 11/27/14-ID recommended Cefuroxime 500mg  BID  -She completes Augmentin on 3/6 which should be enough -Thus far blood cultures and fluid cultures negative  Severe hypokalemia -Magnesium 1.9 -Resolved, will continue to monitor in place as needed   Dehydration -Secondary to poor oral intake and continue IV fluids  Portal vein thrombosis -Patient has had history of GI bleed is currently not anticoagulation candidate  Anemia secondary to chronic disease -Baseline approximately 8 -Hemoglobin dropped to 7.0, patient was transfused 1 unit on 2/29, hemoglobin stable between 8-9 range-Continue to monitor CBC  Hepatitis C -Currently not on treatment  Severe protein calorie malnutrition/failure to thrive -Nutrition consulted and appreciated -Patient started on clear liquid diet with nutritional supplementation  Pancreatic adenocarcinoma -S/p Whipple's procedure  ?Small bowel ileus  resolved and improved- She will start on a regular diet tomorrow 3/5 as she tolerated diet with clear liquids and soft diet 3/3  Code Status: Full  Family Communication: discussed with husband at bedside  Disposition Plan: patient is currently now DNR/DNI and it is recommended to the patient to pursue a palliative care trajectory   see above note  Time Spent in minutes   25 minutes  Procedures  Ultrasound guided paracentesis 2/25 and 2/27  Consults   Interventional radiology Gastroenterology General surgery Oncology Infectious Disease Palliative care  DVT Prophylaxis  SCDs  Lab Results  Component Value Date   PLT 167 11/22/2014     Medications  Scheduled Meds: . amoxicillin-clavulanate  1 tablet Oral Q12H  . antiseptic oral rinse  7 mL Mouth Rinse q12n4p  . chlorhexidine  15 mL Mouth Rinse BID  . feeding supplement (RESOURCE BREEZE)  1 Container Oral TID BM  . furosemide  40 mg Oral Daily  . OxyCODONE  15 mg Oral Q12H  . pantoprazole  40 mg Oral Daily  . sodium chloride  3 mL Intravenous Q12H  . spironolactone  100 mg Oral Daily   Continuous Infusions:   PRN Meds:.acetaminophen, albuterol, bisacodyl, hydrALAZINE, morphine injection, ondansetron (ZOFRAN) IV, oxyCODONE, sodium chloride  Antibiotics    Anti-infectives    Start     Dose/Rate Route Frequency Ordered Stop   11/22/14 2200  amoxicillin-clavulanate (AUGMENTIN) 875-125 MG per tablet 1 tablet     1 tablet Oral Every 12 hours 11/22/14 1859     11/15/14 0000  Ampicillin-Sulbactam (UNASYN) 3 g in sodium chloride 0.9 % 100 mL IVPB  Status:  Discontinued     3 g 100 mL/hr over 60 Minutes Intravenous 4 times per day 11/14/14 1616 11/22/14 1859   11/14/14 1615  Ampicillin-Sulbactam (UNASYN) 3 g in sodium chloride 0.9 % 100 mL IVPB     3 g 100 mL/hr over 60 Minutes Intravenous STAT 11/14/14 1609 11/14/14 1827   11/14/14 1600  ampicillin-sulbactam (UNASYN) 1.5 g in sodium chloride 0.9 % 50 mL IVPB  Status:  Discontinued     1.5 g 100 mL/hr over 30 Minutes Intravenous  Once 11/14/14 1527 11/14/14 1609        Subjective:   Looks well, brighter Still confused as to what is the best course of option for her No chest pain no nausea no vomiting no shortness breath No fever no chills  Objective:   Filed Vitals:   11/23/14 0546 11/23/14 1400 11/23/14 2131 11/24/14 0532  BP: 115/66 117/84 106/72 121/80  Pulse: 84 85 76 76  Temp: 98.3 F (36.8 C) 98 F (36.7 C) 98.8 F (37.1 C) 98.1 F (36.7 C)  TempSrc: Oral Oral Oral Oral  Resp: 16 18 18 18   Height:      Weight:      SpO2: 100% 100% 100% 100%    Wt Readings from Last 3 Encounters:   11/23/14 60.5 kg (133 lb 6.1 oz)  10/29/14 50.395 kg (111 lb 1.6 oz)  10/17/14 45.6 kg (100 lb 8.5 oz)     Intake/Output Summary (Last 24 hours) at 11/24/14 1837 Last data filed at 11/24/14 1558  Gross per 24 hour  Intake    360 ml  Output   2300 ml  Net  -1940 ml    Exam  General: Well developed, thin, cachectic , temporalis wasting, supraclavicular wasting   Cardiovascular: S1 S2 auscultated,RRR  Respiratory: Diminished breath sounds  Abdomen: Firm, distended, diffusely tender, hypoactive bowel sounds   lower extremities are swollen    Data Review   Micro Results Recent Results (from the past 240 hour(s))  Body fluid culture     Status: None   Collection Time: 11/15/14 12:10 PM  Result Value Ref Range Status   Specimen Description ASCITIC  Final   Special Requests NONE  Final   Gram Stain   Final    RARE WBC PRESENT, PREDOMINANTLY PMN NO ORGANISMS SEEN Performed at [facility]    Culture   Final    NO GROWTH 3 DAYS Performed at [facility]    Report Status 11/18/2014 FINAL  Final  Anaerobic culture     Status: None   Collection Time: 11/15/14 12:10 PM  Result Value Ref Range Status   Specimen Description FLUID ASCITIES  Final   Special Requests NONE  Final   Gram Stain   Final    RARE WBC PRESENT, PREDOMINANTLY PMN NO ORGANISMS SEEN Performed at [facility]    Culture   Final    NO ANAEROBES ISOLATED Performed at [facility]    Report Status 11/20/2014 FINAL  Final    Radiology Reports Dg Abd 1 View  11/16/2014   CLINICAL DATA:  Abdominal pain and distention.  EXAM: ABDOMEN - 1 VIEW  COMPARISON:  CT on 11/14/2014  FINDINGS: Chronic and bowel loops in the central abdomen is consistent with ascites. Surgical clips and piece of catheter or stent again seen in right upper quadrant. Mild dilatation of small bowel loops and colon is seen in the central abdomen likely due to on adynamic ileus. Some bowel gas in contrast  is seen in the rectum.  IMPRESSION: Ascites and probable adynamic ileus.   Electronically Signed   By: Earle Gell M.D.   On: 11/16/2014 16:06   Ct Head Wo Contrast  11/14/2014   CLINICAL DATA:  Altered mental status  EXAM: CT HEAD WITHOUT CONTRAST  TECHNIQUE: Contiguous axial images were obtained from the base of the skull through the vertex without intravenous contrast.  COMPARISON:  October 27, 2005  FINDINGS: The ventricles are normal in size and configuration. There  is no mass, hemorrhage, extra-axial fluid collection, or midline shift. Gray-white compartments are normal. No acute infarct apparent. The bony calvarium appears intact. The mastoid air cells are clear.  IMPRESSION: No intracranial mass, hemorrhage, or focal gray -white compartment lesions/acute appearing infarct.   Electronically Signed   By: Lowella Grip III M.D.   On: 11/14/2014 14:17   US Abdomen Complete  11/14/2014   CLINICAL DATA:  Pancreatic cancer post Whipple procedure 08/07/2014, abdominal swelling for 1 week, prior cholecystectomy, hypertension, radiation therapy, smoker  EXAM: ULTRASOUND ABDOMEN COMPLETE  COMPARISON:  CT abdomen and pelvis 10/16/2014  FINDINGS: Gallbladder: Surgically absent  Common bile duct: Diameter: 5 mm diameter, normal  Liver: Heterogeneous in echogenicity. No discrete mass. Portal vein is not well demonstrated ; portal vein demonstrated thrombosis on the prior CT.  IVC: Intrahepatic portion normal appearance. Infrahepatic portion nonvisualized due to bowel gas.  Pancreas: Post Whipple procedure by history. Pancreatic bed inadequately visualized due to bowel gas.  Spleen: Normal appearance, 5.0 cm length  Right Kidney: Length: 9.3 cm. Normal morphology without mass or hydronephrosis.  Left Kidney: Length: 10.0 cm. Normal morphology without mass or hydronephrosis.  Abdominal aorta: Proximally normal caliber, remainder obscured by bowel gas  Other findings: Significant ascites throughout abdomen and  pelvis.  IMPRESSION: Significant ascites significantly increased since prior CT exam, likely cause of abdominal swelling.  Limited exam due to bowel gas, with inadequate visualization of the pancreatic bed, portal vein and portions of the aorta.  Post cholecystectomy.   Electronically Signed   By: Lavonia Dana M.D.   On: 11/14/2014 14:32   Ct Abdomen Pelvis W Contrast  11/14/2014   CLINICAL DATA:  64 year old female with abdominal pain and distension. Current history of pancreatic cancer status post Whipple and radiation. Subsequent encounter.  EXAM: CT ABDOMEN AND PELVIS WITH CONTRAST  TECHNIQUE: Multidetector CT imaging of the abdomen and pelvis was performed using the standard protocol following bolus administration of intravenous contrast.  CONTRAST:  154mL OMNIPAQUE IOHEXOL 300 MG/ML  SOLN  COMPARISON:  10/16/2014 and earlier.  FINDINGS: New moderate size bilateral pleural effusions with lower lobe atelectasis. No pericardial effusion.  Advanced degenerative changes in the lower lumbar spine. No acute osseous abnormality identified.  Increased body wall stranding and density in keeping with a degree of anasarca. Moderate to large volume of ascites in the abdomen has significantly progressed, and in the upper abdomen this is associated with new lobular mass effect on the liver contour (series 2, image 17).  No dilated large bowel. Widespread small and large bowel wall thickening. Pancreatic or biliary stent re- identified within a small bowel loop in the right upper quadrant which today is dilated with fluid up to 4 cm diameter. There is continued intra and extrahepatic biliary ductal dilatation. Pancreatic tail atrophy again noted.  In January of the main portal vein was thrombosed. On today images no portal venous system enhancement is identified.  The stomach is thick walled. Oral contrast was administered, and has reached mid small bowel loops.  Major arterial structures in the abdomen and pelvis appear  patent. Aortoiliac calcified atherosclerosis noted. Renal enhancement and contrast excretion is within normal limits.  IMPRESSION: 1. Progression of disease. Progressed and now diffuse portal venous system thrombosis since January. 2. Anasarca. Large volume ascites, mass effect on the liver may reflect subcapsular or loculated ascites. Diffuse moderate to severe bowel wall thickening. Moderate bilateral pleural effusions. 3. Fluid-filled and dilated small bowel loop draining the pancreas.   Electronically  Signed   By: Genevie Ann M.D.   On: 11/14/2014 14:42   US Abdomen Limited  11/16/2014   CLINICAL DATA:  Abdominal distention question ascites, history Whipple procedure, hypertension, pancreatic cancer  EXAM: LIMITED ABDOMEN ULTRASOUND FOR ASCITES  TECHNIQUE: Limited ultrasound survey for ascites was performed in all four abdominal quadrants.  COMPARISON:  11/14/2014 CT abdomen and pelvis  FINDINGS: Significant ascites is identified in both lower quadrants.  Smaller amount of ascites is seen in the LEFT upper quadrant, with even less in the perihepatic region.  IMPRESSION: Significant ascites.   Electronically Signed   By: Lavonia Dana M.D.   On: 11/16/2014 17:34   US Paracentesis  11/20/2014   INDICATION: Pancreatic cancer, hepatitis-C, ascites. Request is made for diagnostic and therapeutic paracentesis.  EXAM: ULTRASOUND-GUIDED DIAGNOSTIC AND THERAPEUTIC PARACENTESIS  COMPARISON:  Prior paracentesis on 11/17/2014  MEDICATIONS: None.  COMPLICATIONS: None immediate  TECHNIQUE: Informed written consent was obtained from the patient after a discussion of the risks, benefits and alternatives to treatment. A timeout was performed prior to the initiation of the procedure.  Initial ultrasound scanning demonstrates a small amount of ascites within the right lower abdominal quadrant. The right lower abdomen was prepped and draped in the usual sterile fashion. 1% lidocaine was used for local anesthesia. Under direct  ultrasound guidance, a 19 gauge, 7-cm, Yueh catheter was introduced. An ultrasound image was saved for documentation purposed. The paracentesis was performed. The catheter was removed and a dressing was applied. The patient tolerated the procedure well without immediate post procedural complication.  FINDINGS: A total of approximately 1.7 liters of clear, yellow fluid was removed. Samples were sent to the laboratory as requested by the clinical team.  IMPRESSION: Successful ultrasound-guided diagnostic and therapeutic paracentesis yielding 1.7 liters of peritoneal fluid.  Read by: Rowe Robert, PA-C   Electronically Signed   By: Jerilynn Mages.  Shick M.D.   On: 11/20/2014 10:53   US Paracentesis  11/17/2014   INDICATION: ascites  CLINICAL DATA:  Request for therapeutic paracentesis  EXAM: ULTRASOUND-GUIDED PARACENTESIS  ULTRASOUND GUIDED RLQ PARACENTESIS  COMPARISON:  None.  MEDICATIONS: 10 cc 1% lidocaine  COMPLICATIONS: None immediate  TECHNIQUE: Informed written consent was obtained from the patient after a discussion of the risks, benefits and alternatives to treatment. A timeout was performed prior to the initiation of the procedure.  Initial ultrasound scanning demonstrates a large amount of ascites within the right lower abdominal quadrant. The right lower abdomen was prepped and draped in the usual sterile fashion. 1% lidocaine with epinephrine was used for local anesthesia. Under direct ultrasound guidance, a 19 gauge, 7-cm, Yueh catheter was introduced. An ultrasound image was saved for documentation purposed. the paracentesis was performed. The catheter was removed and a dressing was applied. The patient tolerated the procedure well without immediate post procedural complication.  FINDINGS: A total of approximately 3 liters of serous fluid was removed.  IMPRESSION: Successful ultrasound-guided paracentesis yielding 3 liters of peritoneal fluid.  PROCEDURE: Ultrasound was performed to localize and mark an adequate  pocket of fluid in the right quadrant of the abdomen. The area was then prepped and draped in the normal sterile fashion. 1% Lidocaine was used for local anesthesia. Under ultrasound guidance a 19 gauge Yueh catheter was introduced. Paracentesis was performed. The catheter was removed and a dressing applied.  Read by:  Lavonia Drafts [facility]   Electronically Signed   By: Jacqulynn Cadet M.D.   On: 11/17/2014 11:31   US Paracentesis  11/15/2014   CLINICAL DATA:  Ascites, history of pancreatic cancer. Request therapeutic and diagnostic paracentesis of up to 3 L max.  EXAM: ULTRASOUND GUIDED PARACENTESIS  COMPARISON:  None.  PROCEDURE: An ultrasound guided paracentesis was thoroughly discussed with the patient and questions answered. The benefits, risks, alternatives and complications were also discussed. The patient understands and wishes to proceed with the procedure. Written consent was obtained.  Ultrasound was performed to localize and mark an adequate pocket of fluid in the left lower quadrant of the abdomen. The area was then prepped and draped in the normal sterile fashion. 1% Lidocaine was used for local anesthesia. Under ultrasound guidance a 19 gauge Yueh catheter was introduced. Paracentesis was performed. The catheter was removed and a dressing applied.  COMPLICATIONS: None immediate  FINDINGS: A total of approximately 3 L of clear yellow fluid was removed. A fluid sample was sent for laboratory analysis.  IMPRESSION: Successful ultrasound guided paracentesis yielding 3 L of ascites.  Read by: Ascencion Dike PA-C   Electronically Signed   By: Corrie Mckusick D.O.   On: 11/15/2014 12:55    CBC  Recent Labs Lab 11/18/14 0545 11/19/14 0520 11/20/14 0500 11/21/14 0530 11/22/14 0505  WBC 5.7 4.1 5.4 4.9 5.0  HGB 7.6* 7.0* 9.2* 8.5* 8.8*  HCT 23.2* 21.8* 27.0* 25.5* 26.9*  PLT 219 192 196 153 167  MCV 85.9 86.5 86.8 89.2 90.0  MCH 28.1 27.8 29.6 29.7 29.4  MCHC 32.8 32.1 34.1 33.3 32.7  RDW  23.9* 24.3* 22.8* 23.3* 23.8*  LYMPHSABS  --   --   --   --  0.8  MONOABS  --   --   --   --  0.6  EOSABS  --   --   --   --  0.2  BASOSABS  --   --   --   --  0.0    Chemistries   Recent Labs Lab 11/19/14 0520 11/20/14 0500 11/21/14 0530 11/22/14 0505 11/23/14 0850 11/24/14 0535  NA 138 139 139 136 133* 129*  K 3.3* 4.1 4.4 4.4 4.2 3.8  CL 114* 115* 118* 112 109 103  CO2 20 19 19 21 21 21   GLUCOSE 93 80 93 85 73 73  BUN 6 5* <5* 5* 6 6  CREATININE <0.30* <0.30* <0.30* 0.44* 0.49* 0.62  CALCIUM 7.8* 7.9* 7.6* 7.8* 7.9* 7.7*  AST 70* 64* 59* 54* 58*  --   ALT 37* 35 32 29 32  --   ALKPHOS 671* 633* 541* 541* 517*  --   BILITOT 8.4* 8.7* 8.0* 8.0* 8.5*  --    ------------------------------------------------------------------------------------------------------------------ estimated creatinine clearance is 61.1 mL/min (by C-G formula based on Cr of 0.62). ------------------------------------------------------------------------------------------------------------------ No results for input(s): HGBA1C in the last 72 hours. ------------------------------------------------------------------------------------------------------------------ No results for input(s): CHOL, HDL, LDLCALC, TRIG, CHOLHDL, LDLDIRECT in the last 72 hours. ------------------------------------------------------------------------------------------------------------------ No results for input(s): TSH, T4TOTAL, T3FREE, THYROIDAB in the last 72 hours.  Invalid input(s): FREET3 ------------------------------------------------------------------------------------------------------------------ No results for input(s): VITAMINB12, FOLATE, FERRITIN, TIBC, IRON, RETICCTPCT in the last 72 hours.  Coagulation profile No results for input(s): INR, PROTIME in the last 168 hours.  No results for input(s): DDIMER in the last 72 hours.  Cardiac Enzymes No results for input(s): CKMB, TROPONINI, MYOGLOBIN in the last 168  hours.  Invalid input(s): CK ------------------------------------------------------------------------------------------------------------------ Invalid input(s): POCBNP  Verneita Griffes, MD [facility] [phone]

## 2014-11-24 NOTE — Progress Notes (Signed)
    Progress Note   Subjective  no abdominal pain. Eating okay.     Objective   Vital signs in last 24 hours: Temp:  [98 F (36.7 C)-98.8 F (37.1 C)] 98.1 F (36.7 C) (03/05 0532) Pulse Rate:  [76-85] 76 (03/05 0532) Resp:  [18] 18 (03/05 0532) BP: (106-121)/(72-84) 121/80 mmHg (03/05 0532) SpO2:  [100 %] 100 % (03/05 0532) Last BM Date: 11/22/14 General:    Pleasant black female in NAD Abdomen:  Soft, moderately distended, mild diffuse tenderness.  Normal bowel sounds. Extremities:  BLE pitting edema.  Neurologic:  Alert and oriented,  grossly normal neurologically. Psych:  Cooperative. Normal mood and affect.  Intake/Output from previous day: 03/04 0701 - 03/05 0700 In: 840 [P.O.:600; I.V.:240] Out: 2675 [Urine:2675] Intake/Output this shift:    Lab Results:  Recent Labs  11/22/14 0505  WBC 5.0  HGB 8.8*  HCT 26.9*  PLT 167   BMET  Recent Labs  11/22/14 0505 11/23/14 0850 11/24/14 0535  NA 136 133* 129*  K 4.4 4.2 3.8  CL 112 109 103  CO2 21 21 21   GLUCOSE 85 73 73  BUN 5* 6 6  CREATININE 0.44* 0.49* 0.62  CALCIUM 7.8* 7.9* 7.7*   LFT  Recent Labs  11/23/14 0850  PROT 6.9  ALBUMIN 1.4*  AST 58*  ALT 32  ALKPHOS 517*  BILITOT 8.5*      Assessment / Plan:    78. 64 year old female with pancreatic cancer, s/p whipple Nov 2015. Post-op course complicated by recurrent hospitalizations for bacteremia, PV thrombosis and general decline. Now with ascites, likely secondary to portal HTN but malignancy not excluded. Cytology reveals atypical cells.   Diuretics have been initiated, hopefully this will slow down rate of ascites accumulation. Net I/O yesterday -1875.ml on lasix 40 / aldactone 100.   Renal function normal but creatinine is creeping up.    IR will re-evaluate for [place] shunt or tunneled external catheter if ascites not manageable with diuretics. Repeat BMET in am.   2. DNR. Palliative Care following, Goal of care is prolongation of  life.     LOS: 10 days   Tye Savoy  11/24/2014, 9:41 AM   GI Attending Note  I have personally taken an interval history, reviewed the chart, and examined the patient.  I agree with the extender's note, impression and recommendations.  C/o mild abdominal discomfort.  Abdomen is tympanitic.  Need to follow Cr.  Sandy Salaam. Deatra Ina, MD, [facility] [phone]

## 2014-11-25 ENCOUNTER — Inpatient Hospital Stay (HOSPITAL_COMMUNITY): Payer: Medicaid Other

## 2014-11-25 LAB — COMPREHENSIVE METABOLIC PANEL
ALK PHOS: 521 U/L — AB (ref 39–117)
ALT: 28 U/L (ref 0–35)
AST: 57 U/L — ABNORMAL HIGH (ref 0–37)
Albumin: 1.3 g/dL — ABNORMAL LOW (ref 3.5–5.2)
Anion gap: 5 (ref 5–15)
BUN: 7 mg/dL (ref 6–23)
CHLORIDE: 102 mmol/L (ref 96–112)
CO2: 22 mmol/L (ref 19–32)
Calcium: 7.7 mg/dL — ABNORMAL LOW (ref 8.4–10.5)
Creatinine, Ser: 0.73 mg/dL (ref 0.50–1.10)
GFR calc Af Amer: >90 mL/min (ref >90)
Glucose, Bld: 69 mg/dL — ABNORMAL LOW (ref 70–99)
POTASSIUM: 3.2 mmol/L — AB (ref 3.5–5.1)
Sodium: 129 mmol/L — ABNORMAL LOW (ref 135–145)
Total Bilirubin: 8.1 mg/dL — ABNORMAL HIGH (ref 0.3–1.2)
Total Protein: 7.1 g/dL (ref 6.0–8.3)

## 2014-11-25 LAB — PROTIME-INR
INR: 1.44 (ref 0.00–1.49)
Prothrombin Time: 17.7 seconds — ABNORMAL HIGH (ref 11.6–15.2)

## 2014-11-25 LAB — CBC WITH DIFFERENTIAL/PLATELET
Basophils Absolute: 0 10*3/uL (ref 0.0–0.1)
Basophils Relative: 0 % (ref 0–1)
Eosinophils Absolute: 0.1 10*3/uL (ref 0.0–0.7)
Eosinophils Relative: 1 % (ref 0–5)
HCT: 26 % — ABNORMAL LOW (ref 36.0–46.0)
Hemoglobin: 8.7 g/dL — ABNORMAL LOW (ref 12.0–15.0)
Lymphocytes Relative: 12 % (ref 12–46)
Lymphs Abs: 0.8 10*3/uL (ref 0.7–4.0)
MCH: 30 pg (ref 26.0–34.0)
MCHC: 33.5 g/dL (ref 30.0–36.0)
MCV: 89.7 fL (ref 78.0–100.0)
MONO ABS: 0.7 10*3/uL (ref 0.1–1.0)
Monocytes Relative: 11 % (ref 3–12)
NEUTROS ABS: 5.2 10*3/uL (ref 1.7–7.7)
NEUTROS PCT: 76 % (ref 43–77)
Platelets: 163 10*3/uL (ref 150–400)
RBC: 2.9 MIL/uL — AB (ref 3.87–5.11)
RDW: 23.9 % — AB (ref 11.5–15.5)
WBC: 6.8 10*3/uL (ref 4.0–10.5)

## 2014-11-25 MED ORDER — ALUM & MAG HYDROXIDE-SIMETH 200-200-20 MG/5ML PO SUSP
15.0000 mL | Freq: Once | ORAL | Status: DC
  Filled 2014-11-25: qty 30

## 2014-11-25 MED ORDER — LACTULOSE 10 GM/15ML PO SOLN
20.0000 g | Freq: Two times a day (BID) | ORAL | Status: DC
Start: 2014-11-25 — End: 2014-11-29
  Administered 2014-11-26 – 2014-11-27 (×4): 20 g via ORAL
  Filled 2014-11-25 (×10): qty 30

## 2014-11-25 MED ORDER — POLYETHYLENE GLYCOL 3350 17 G PO PACK
17.0000 g | PACK | Freq: Two times a day (BID) | ORAL | Status: DC
  Administered 2014-11-25 – 2014-11-27 (×4): 17 g via ORAL
  Filled 2014-11-25 (×8): qty 1

## 2014-11-25 NOTE — Progress Notes (Signed)
    Progress Note   Subjective  Shooting abdominal pains    Objective   Vital signs in last 24 hours: Temp:  [98.3 F (36.8 C)-98.5 F (36.9 C)] 98.5 F (36.9 C) (03/06 0628) Pulse Rate:  [80-95] 95 (03/06 0628) Resp:  [18] 18 (03/06 0628) BP: (104-106)/(68-76) 106/68 mmHg (03/06 0628) SpO2:  [100 %] 100 % (03/06 0628) Weight:  [131 lb 2.8 oz (59.5 kg)] 131 lb 2.8 oz (59.5 kg) (03/06 0628) Last BM Date: 11/22/14 General:    black female in NAD Heart:  Regular rate and rhythm Abdomen:  Soft, mod distended with tympany, mild diffuse tenderness.  Normal bowel sounds. Extremities:  BLE edema Neurologic:  Alert and oriented,  grossly normal neurologically. Psych:  Cooperative. Normal mood and affect.  Lab Results:  Recent Labs  11/25/14 0820  WBC 6.8  HGB 8.7*  HCT 26.0*  PLT 163   BMET  Recent Labs  11/23/14 0850 11/24/14 0535  NA 133* 129*  K 4.2 3.8  CL 109 103  CO2 21 21  GLUCOSE 73 73  BUN 6 6  CREATININE 0.49* 0.62  CALCIUM 7.9* 7.7*   LFT  Recent Labs  11/23/14 0850  PROT 6.9  ALBUMIN 1.4*  AST 58*  ALT 32  ALKPHOS 517*  BILITOT 8.5*   PT/INR  Recent Labs  11/25/14 0820  LABPROT 17.7*  INR 1.44      Assessment / Plan:    64 year old female with pancreatic cancer, s/p whipple Nov 2015. Post-op course complicated by recurrent hospitalizations for bacteremia, PV thrombosis and general decline. Now with ascites, likely secondary to portal HTN but malignancy not excluded. Cytology reveals atypical cells.   Diuretics have been initiated, hopefully this will slow down rate of ascites accumulation. on lasix 40 /      aldactone 100. Her sodium is falling, may need to decrease diuretic if continues to decrease  Renal function normal but creatinine has been creeping up. Today's CMET still pending   IR will re-evaluate for [place] shunt or tunneled external catheter if ascites not manageable with             diuretics. Repeat BMET in  am.  Obtain KUB, she is distended but tympanitic so not sure this is all just ascites.   2. DNR. Palliative Care following, Goal of care is prolongation of life.   Addendum:   KUB c/w increased stool in colon. Suspect this is probably cause of increasing distention. Will give two doses of miralax today. Check u/s in am to see status of ascites hoping that it is isn't reaccumulating on diuretics.    LOS: 11 days   Tye Savoy  11/25/2014, 8:56 AM   GI Attending Note  I have personally taken an interval history, reviewed the chart, and examined the patient.  I agree with the extender's note, impression and recommendations. Has increased abdominal distention which is probably secondary to stool.  Plan as above.  Watch renal function carefully.  Encourage to take increased po.  Ask nutrition to see for supplements.  Ultrasound tomorrow to evaluate for increasing ascites.  Sandy Salaam. Deatra Ina, MD, [facility] [phone]

## 2014-11-25 NOTE — Progress Notes (Signed)
Pt encouraged repeatedly throughout shift by NT and nurse to allow [place] to assist with bath and personal care. Continuously refused stating "I'm waiting on my daughter to come and help me".  Pt reassured.

## 2014-11-25 NOTE — Progress Notes (Signed)
[facility]                                                                              64 y.o.? adenocarcinoma of head of pancreas, status post Whipple's procedure 08/07/14, completed concurrent capecitabine and radiation, poor performance status and hence did not get adjuvant chemotherapy, recurrent Klebsiella bacteremia- attributed to intra-abdominal source/presumed septic portal vein thrombus, acute portal vein thrombosis-not on anticoagulation secondary to GI bleed, tobacco abuse & hepatitis C admit 11/14/14 with complaints of TME, abdominal distention/pain and poor appetite. gradually worsening confusion, disorientation and lethargic to a point of near unresponsiveness decreased oral intake of liquids and food - nausea or vomiting, ?abdominal lower extremity swelling   intermittent abdominal pain and no fevers.  Was on IV antibiotics through right upper extremity PICC line and therefore plans to remove PICC line and from IV Unasyn to oral cefuroxime.   on admission somnolent but arousable, afebrile, elevated blood pressure, lab work significant for potassium <2, abnormal LFTs, ammonia 120, hemoglobin 8.5, CT head without acute findings, CT abdomen and pelvis: Diffuse portal vein system thrombosis, anasarca, diffuse moderate to severe bowel wall thickening, moderate bilateral pleural effusions and fluid filled and dilated small bowel loop draining the pancreas. Hospitalist admission was requested. Since admission Patient has had paracentesis twice. Patient continues to develop fluid and requires further tapping. Discussed with radiology possible catheter placement versus [place] shunt.  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 -empiric lactulose 20 bid started 3/6  Anasarca/ascites -Secondary to liver disease, portal vein thrombosis and hypoalbuminemia with malnutrition -IR/GI and Pallitve appreciated -Patient had ultrasound-guided paracentesis with removal of 3 L (11/15/2014) -Patient empirically started on IV Unasyn 2/24 which was tapered on 3/3 2 Augmentin to cover for possible SBP-stop date 11/25/14 which will be 10 days of treatment -Spoke with pathology: Fluid cytology shows atypical cells--+ 4 Caklretinin and MT-1 -Fluid culture: no growth to date, no organisms seen -Fluid chemistry not consistent with SBP -SAAG unable to accurately calculate as patient's ascitic albumin was not a specific number, rather <1.   -Patient continues to have a firm abdomen with pain- US showed significant ascites on 11/16/2014 -Repeat US paracentesis on 11/17/2014 removed 3L- however patient's abdomen looks distended and firm -For [place] abd 3/6 for ? Paracentesis-will follow -Dr. Maye Hides D/W family 11/17/2014, regarding prognosis and Code status. Palliative care consulted and appreciated -Long discussion with family as well as with patient and with palliative care as well as with interventional radiology totaling- Wants Medical therapies-not indwelling drain  For now-strict I's and O's, keep indwelling Foley catheter, daily weights Urine/sodium ratio = 4.2--> indicative of the fact that the patient is not holding onto sodium--maybe has room to increase Lasix further but slowly. Net 500 cc neg now-Diuresis appears to be working-this is nonspecific but indicates non malignant ascites We will continue to diureses her slowly Labs am  Previous Klebsiella and streptococcal bacteremia -ID  consulted and appreciated -Patient needs to complete 42 day course of antibiotics which should have ended 11/27/14-ID recommended Cefuroxime 500mg  BID  -She completes Augmentin on 3/6 which should be  enough -Thus far blood cultures and fluid cultures negative  Severe hypokalemia -Magnesium 1.9 -Resolved, will continue to monitor in place as needed   Dehydration -Secondary to poor oral intake  -on full diet now -creatinine/ Estimated Creatinine Clearance: 61.1 mL/min (by C-G formula based on Cr of 0.73). 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--SHe has adequate room for diuresis  Portal vein thrombosis -Patient has had history of GI bleed is currently not anticoagulation candidate  Anemia secondary to chronic disease -Baseline approximately 8 -Hemoglobin dropped to 7.0, patient was transfused 1 unit on 2/29,  hemoglobin stable between 8-9 range-Continue to monitor CBC periodically  Hepatitis C -Currently not on treatment  Severe protein calorie malnutrition/failure to thrive -Nutrition consulted and appreciated -Patient started on clear liquid diet with nutritional supplementation  Pancreatic adenocarcinoma -S/p Whipple's procedure  ?Small bowel ileus Eating poorly but tolerating Diet since 3/5 ?TPN [risky-finishing Abx today 3/6]  Abd pain axr 3/6-stool GI started Miralax Would favour lactulose prn  Code Status: Full Family Communication: discussed with daughter # Disposition Plan: patient is currently now DNR/DNI and it is recommended to the patient to pursue a palliative care trajectory   see above note  Time Spent in minutes   35  Procedures  Ultrasound guided paracentesis 2/25 and 2/27  Consults   Interventional radiology Gastroenterology General surgery Oncology Infectious Disease Palliative care  DVT Prophylaxis  SCDs  Lab Results  Component Value Date   PLT 163 11/25/2014    Medications  Scheduled Meds: . amoxicillin-clavulanate  1 tablet Oral Q12H  . antiseptic oral rinse  7 mL Mouth Rinse q12n4p  . chlorhexidine  15 mL Mouth Rinse BID  . feeding supplement (RESOURCE BREEZE)  1 Container Oral TID BM  . furosemide  40 mg Oral Daily  . OxyCODONE  15 mg Oral  Q12H  . pantoprazole  40 mg Oral Daily  . polyethylene glycol  17 g Oral BID  . sodium chloride  3 mL Intravenous Q12H  . spironolactone  100 mg Oral Daily   Continuous Infusions:   PRN Meds:.acetaminophen, albuterol, bisacodyl, hydrALAZINE, morphine injection, ondansetron (ZOFRAN) IV, oxyCODONE, sodium chloride  Antibiotics    Anti-infectives    Start     Dose/Rate Route Frequency Ordered Stop   11/22/14 2200  amoxicillin-clavulanate (AUGMENTIN) 875-125 MG per tablet 1 tablet     1 tablet Oral Every 12 hours 11/22/14 1859     11/15/14 0000  Ampicillin-Sulbactam (UNASYN) 3 g in sodium chloride 0.9 % 100 mL IVPB  Status:  Discontinued     3 g 100 mL/hr over 60 Minutes Intravenous 4 times per day 11/14/14 1616 11/22/14 1859   11/14/14 1615  Ampicillin-Sulbactam (UNASYN) 3 g in sodium chloride 0.9 % 100 mL IVPB     3 g 100 mL/hr over 60 Minutes Intravenous STAT 11/14/14 1609 11/14/14 1827   11/14/14 1600  ampicillin-sulbactam (UNASYN) 1.5 g in sodium chloride 0.9 % 50 mL IVPB  Status:  Discontinued     1.5 g 100 mL/hr over 30 Minutes Intravenous  Once 11/14/14 1527 11/14/14 1609        Subjective:   Abd pain earlier Better nowGassy ++ No other co   Objective:   Filed Vitals:   11/24/14 1400 11/24/14 2210 11/25/14 0628 11/25/14 1312  BP: 105/72 104/76 106/68 115/67  Pulse: 80 83  95 82  Temp: 98.3 F (36.8 C) 98.5 F (36.9 C) 98.5 F (36.9 C) 98.3 F (36.8 C)  TempSrc: Oral Oral Oral Oral  Resp: 18 18 18 18   Height:      Weight:   59.5 kg (131 lb 2.8 oz)   SpO2:  100% 100% 98%    Wt Readings from Last 3 Encounters:  11/25/14 59.5 kg (131 lb 2.8 oz)  10/29/14 50.395 kg (111 lb 1.6 oz)  10/17/14 45.6 kg (100 lb 8.5 oz)     Intake/Output Summary (Last 24 hours) at 11/25/14 1744 Last data filed at 11/25/14 1312  Gross per 24 hour  Intake    120 ml  Output   2375 ml  Net  -2255 ml    Exam  General: Well developed, thin, cachectic , temporalis wasting,  supraclavicular wasting   Cardiovascular: S1 S2 auscultated,RRR  Respiratory: Diminished breath sounds  Abdomen: Firm, distended, diffusely tender, hypoactive bowel sounds-non tedner currentyl   lower extremities are swollen    Data Review   Micro Results No results found for this or any previous visit (from the past 240 hour(s)).  Radiology Reports Dg Abd 1 View  11/25/2014   CLINICAL DATA:  Generalized abdominal pain and distention. Pancreatic carcinoma. Previous Whipple procedure.  EXAM: ABDOMEN - 1 VIEW  COMPARISON:  11/16/2014  FINDINGS: Mild generalized gaseous distention of the colon is seen. Moderate amount stool is noted in the ascending and transverse portions of the colon. No definite evidence of small bowel obstruction. Crowding of bowel loops is seen in the central abdomen suggesting ascites. Surgical clips in stent also seen within the mid abdomen, likely related to previous Whipple procedure.  IMPRESSION: Suspect mild colonic ileus. Moderate colonic stool burden also noted. Probable ascites.   Electronically Signed   By: Earle Gell M.D.   On: 11/25/2014 10:55   Dg Abd 1 View  11/16/2014   CLINICAL DATA:  Abdominal pain and distention.  EXAM: ABDOMEN - 1 VIEW  COMPARISON:  CT on 11/14/2014  FINDINGS: Chronic and bowel loops in the central abdomen is consistent with ascites. Surgical clips and piece of catheter or stent again seen in right upper quadrant. Mild dilatation of small bowel loops and colon is seen in the central abdomen likely due to on adynamic ileus. Some bowel gas in contrast is seen in the rectum.  IMPRESSION: Ascites and probable adynamic ileus.   Electronically Signed   By: Earle Gell M.D.   On: 11/16/2014 16:06   Ct Head Wo Contrast  11/14/2014   CLINICAL DATA:  Altered mental status  EXAM: CT HEAD WITHOUT CONTRAST  TECHNIQUE: Contiguous axial images were obtained from the base of the skull through the vertex without intravenous contrast.  COMPARISON:   October 27, 2005  FINDINGS: The ventricles are normal in size and configuration. There is no mass, hemorrhage, extra-axial fluid collection, or midline shift. Gray-white compartments are normal. No acute infarct apparent. The bony calvarium appears intact. The mastoid air cells are clear.  IMPRESSION: No intracranial mass, hemorrhage, or focal gray -white compartment lesions/acute appearing infarct.   Electronically Signed   By: Lowella Grip III M.D.   On: 11/14/2014 14:17   US Abdomen Complete  11/14/2014   CLINICAL DATA:  Pancreatic cancer post Whipple procedure 08/07/2014, abdominal swelling for 1 week, prior cholecystectomy, hypertension, radiation therapy, smoker  EXAM: ULTRASOUND ABDOMEN COMPLETE  COMPARISON:  CT abdomen and pelvis 10/16/2014  FINDINGS: Gallbladder: Surgically absent  Common bile duct:  Diameter: 5 mm diameter, normal  Liver: Heterogeneous in echogenicity. No discrete mass. Portal vein is not well demonstrated ; portal vein demonstrated thrombosis on the prior CT.  IVC: Intrahepatic portion normal appearance. Infrahepatic portion nonvisualized due to bowel gas.  Pancreas: Post Whipple procedure by history. Pancreatic bed inadequately visualized due to bowel gas.  Spleen: Normal appearance, 5.0 cm length  Right Kidney: Length: 9.3 cm. Normal morphology without mass or hydronephrosis.  Left Kidney: Length: 10.0 cm. Normal morphology without mass or hydronephrosis.  Abdominal aorta: Proximally normal caliber, remainder obscured by bowel gas  Other findings: Significant ascites throughout abdomen and pelvis.  IMPRESSION: Significant ascites significantly increased since prior CT exam, likely cause of abdominal swelling.  Limited exam due to bowel gas, with inadequate visualization of the pancreatic bed, portal vein and portions of the aorta.  Post cholecystectomy.   Electronically Signed   By: Lavonia Dana M.D.   On: 11/14/2014 14:32   Ct Abdomen Pelvis W Contrast  11/14/2014   CLINICAL  DATA:  64 year old female with abdominal pain and distension. Current history of pancreatic cancer status post Whipple and radiation. Subsequent encounter.  EXAM: CT ABDOMEN AND PELVIS WITH CONTRAST  TECHNIQUE: Multidetector CT imaging of the abdomen and pelvis was performed using the standard protocol following bolus administration of intravenous contrast.  CONTRAST:  163mL OMNIPAQUE IOHEXOL 300 MG/ML  SOLN  COMPARISON:  10/16/2014 and earlier.  FINDINGS: New moderate size bilateral pleural effusions with lower lobe atelectasis. No pericardial effusion.  Advanced degenerative changes in the lower lumbar spine. No acute osseous abnormality identified.  Increased body wall stranding and density in keeping with a degree of anasarca. Moderate to large volume of ascites in the abdomen has significantly progressed, and in the upper abdomen this is associated with new lobular mass effect on the liver contour (series 2, image 17).  No dilated large bowel. Widespread small and large bowel wall thickening. Pancreatic or biliary stent re- identified within a small bowel loop in the right upper quadrant which today is dilated with fluid up to 4 cm diameter. There is continued intra and extrahepatic biliary ductal dilatation. Pancreatic tail atrophy again noted.  In January of the main portal vein was thrombosed. On today images no portal venous system enhancement is identified.  The stomach is thick walled. Oral contrast was administered, and has reached mid small bowel loops.  Major arterial structures in the abdomen and pelvis appear patent. Aortoiliac calcified atherosclerosis noted. Renal enhancement and contrast excretion is within normal limits.  IMPRESSION: 1. Progression of disease. Progressed and now diffuse portal venous system thrombosis since January. 2. Anasarca. Large volume ascites, mass effect on the liver may reflect subcapsular or loculated ascites. Diffuse moderate to severe bowel wall thickening. Moderate  bilateral pleural effusions. 3. Fluid-filled and dilated small bowel loop draining the pancreas.   Electronically Signed   By: Genevie Ann M.D.   On: 11/14/2014 14:42   US Abdomen Limited  11/16/2014   CLINICAL DATA:  Abdominal distention question ascites, history Whipple procedure, hypertension, pancreatic cancer  EXAM: LIMITED ABDOMEN ULTRASOUND FOR ASCITES  TECHNIQUE: Limited ultrasound survey for ascites was performed in all four abdominal quadrants.  COMPARISON:  11/14/2014 CT abdomen and pelvis  FINDINGS: Significant ascites is identified in both lower quadrants.  Smaller amount of ascites is seen in the LEFT upper quadrant, with even less in the perihepatic region.  IMPRESSION: Significant ascites.   Electronically Signed   By: Lavonia Dana M.D.   On:  11/16/2014 17:34   US Paracentesis  11/20/2014   INDICATION: Pancreatic cancer, hepatitis-C, ascites. Request is made for diagnostic and therapeutic paracentesis.  EXAM: ULTRASOUND-GUIDED DIAGNOSTIC AND THERAPEUTIC PARACENTESIS  COMPARISON:  Prior paracentesis on 11/17/2014  MEDICATIONS: None.  COMPLICATIONS: None immediate  TECHNIQUE: Informed written consent was obtained from the patient after a discussion of the risks, benefits and alternatives to treatment. A timeout was performed prior to the initiation of the procedure.  Initial ultrasound scanning demonstrates a small amount of ascites within the right lower abdominal quadrant. The right lower abdomen was prepped and draped in the usual sterile fashion. 1% lidocaine was used for local anesthesia. Under direct ultrasound guidance, a 19 gauge, 7-cm, Yueh catheter was introduced. An ultrasound image was saved for documentation purposed. The paracentesis was performed. The catheter was removed and a dressing was applied. The patient tolerated the procedure well without immediate post procedural complication.  FINDINGS: A total of approximately 1.7 liters of clear, yellow fluid was removed. Samples were sent  to the laboratory as requested by the clinical team.  IMPRESSION: Successful ultrasound-guided diagnostic and therapeutic paracentesis yielding 1.7 liters of peritoneal fluid.  Read by: Rowe Robert, PA-C   Electronically Signed   By: Jerilynn Mages.  Shick M.D.   On: 11/20/2014 10:53   US Paracentesis  11/17/2014   INDICATION: ascites  CLINICAL DATA:  Request for therapeutic paracentesis  EXAM: ULTRASOUND-GUIDED PARACENTESIS  ULTRASOUND GUIDED RLQ PARACENTESIS  COMPARISON:  None.  MEDICATIONS: 10 cc 1% lidocaine  COMPLICATIONS: None immediate  TECHNIQUE: Informed written consent was obtained from the patient after a discussion of the risks, benefits and alternatives to treatment. A timeout was performed prior to the initiation of the procedure.  Initial ultrasound scanning demonstrates a large amount of ascites within the right lower abdominal quadrant. The right lower abdomen was prepped and draped in the usual sterile fashion. 1% lidocaine with epinephrine was used for local anesthesia. Under direct ultrasound guidance, a 19 gauge, 7-cm, Yueh catheter was introduced. An ultrasound image was saved for documentation purposed. the paracentesis was performed. The catheter was removed and a dressing was applied. The patient tolerated the procedure well without immediate post procedural complication.  FINDINGS: A total of approximately 3 liters of serous fluid was removed.  IMPRESSION: Successful ultrasound-guided paracentesis yielding 3 liters of peritoneal fluid.  PROCEDURE: Ultrasound was performed to localize and mark an adequate pocket of fluid in the right quadrant of the abdomen. The area was then prepped and draped in the normal sterile fashion. 1% Lidocaine was used for local anesthesia. Under ultrasound guidance a 19 gauge Yueh catheter was introduced. Paracentesis was performed. The catheter was removed and a dressing applied.  Read by:  Lavonia Drafts [facility]   Electronically Signed   By: Jacqulynn Cadet M.D.   On:  11/17/2014 11:31   US Paracentesis  11/15/2014   CLINICAL DATA:  Ascites, history of pancreatic cancer. Request therapeutic and diagnostic paracentesis of up to 3 L max.  EXAM: ULTRASOUND GUIDED PARACENTESIS  COMPARISON:  None.  PROCEDURE: An ultrasound guided paracentesis was thoroughly discussed with the patient and questions answered. The benefits, risks, alternatives and complications were also discussed. The patient understands and wishes to proceed with the procedure. Written consent was obtained.  Ultrasound was performed to localize and mark an adequate pocket of fluid in the left lower quadrant of the abdomen. The area was then prepped and draped in the normal sterile fashion. 1% Lidocaine was used for local anesthesia. Under ultrasound  guidance a 19 gauge Yueh catheter was introduced. Paracentesis was performed. The catheter was removed and a dressing applied.  COMPLICATIONS: None immediate  FINDINGS: A total of approximately 3 L of clear yellow fluid was removed. A fluid sample was sent for laboratory analysis.  IMPRESSION: Successful ultrasound guided paracentesis yielding 3 L of ascites.  Read by: Ascencion Dike PA-C   Electronically Signed   By: Corrie Mckusick D.O.   On: 11/15/2014 12:55    CBC  Recent Labs Lab 11/19/14 0520 11/20/14 0500 11/21/14 0530 11/22/14 0505 11/25/14 0820  WBC 4.1 5.4 4.9 5.0 6.8  HGB 7.0* 9.2* 8.5* 8.8* 8.7*  HCT 21.8* 27.0* 25.5* 26.9* 26.0*  PLT 192 196 153 167 163  MCV 86.5 86.8 89.2 90.0 89.7  MCH 27.8 29.6 29.7 29.4 30.0  MCHC 32.1 34.1 33.3 32.7 33.5  RDW 24.3* 22.8* 23.3* 23.8* 23.9*  LYMPHSABS  --   --   --  0.8 0.8  MONOABS  --   --   --  0.6 0.7  EOSABS  --   --   --  0.2 0.1  BASOSABS  --   --   --  0.0 0.0    Chemistries   Recent Labs Lab 11/20/14 0500 11/21/14 0530 11/22/14 0505 11/23/14 0850 11/24/14 0535 11/25/14 0820  NA 139 139 136 133* 129* 129*  K 4.1 4.4 4.4 4.2 3.8 3.2*  CL 115* 118* 112 109 103 102  CO2 19 19 21 21  21 22   GLUCOSE 80 93 85 73 73 69*  BUN 5* <5* 5* 6 6 7   CREATININE <0.30* <0.30* 0.44* 0.49* 0.62 0.73  CALCIUM 7.9* 7.6* 7.8* 7.9* 7.7* 7.7*  AST 64* 59* 54* 58*  --  57*  ALT 35 32 29 32  --  28  ALKPHOS 633* 541* 541* 517*  --  521*  BILITOT 8.7* 8.0* 8.0* 8.5*  --  8.1*   ------------------------------------------------------------------------------------------------------------------ estimated creatinine clearance is 61.1 mL/min (by C-G formula based on Cr of 0.73). ------------------------------------------------------------------------------------------------------------------ No results for input(s): HGBA1C in the last 72 hours. ------------------------------------------------------------------------------------------------------------------ No results for input(s): CHOL, HDL, LDLCALC, TRIG, CHOLHDL, LDLDIRECT in the last 72 hours. ------------------------------------------------------------------------------------------------------------------ No results for input(s): TSH, T4TOTAL, T3FREE, THYROIDAB in the last 72 hours.  Invalid input(s): FREET3 ------------------------------------------------------------------------------------------------------------------ No results for input(s): VITAMINB12, FOLATE, FERRITIN, TIBC, IRON, RETICCTPCT in the last 72 hours.  Coagulation profile  Recent Labs Lab 11/25/14 0820  INR 1.44    No results for input(s): DDIMER in the last 72 hours.  Cardiac Enzymes No results for input(s): CKMB, TROPONINI, MYOGLOBIN in the last 168 hours.  Invalid input(s): CK ------------------------------------------------------------------------------------------------------------------ Invalid input(s): POCBNP  Verneita Griffes, MD [facility] [phone]

## 2014-11-26 ENCOUNTER — Inpatient Hospital Stay (HOSPITAL_COMMUNITY): Payer: Medicaid Other

## 2014-11-26 ENCOUNTER — Other Ambulatory Visit: Payer: Medicaid Other

## 2014-11-26 ENCOUNTER — Ambulatory Visit: Payer: Medicaid Other | Admitting: Nurse Practitioner

## 2014-11-26 DIAGNOSIS — R945 Abnormal results of liver function studies: Secondary | ICD-10-CM

## 2014-11-26 DIAGNOSIS — R7989 Other specified abnormal findings of blood chemistry: Secondary | ICD-10-CM

## 2014-11-26 LAB — CBC WITH DIFFERENTIAL/PLATELET
Basophils Absolute: 0 10*3/uL (ref 0.0–0.1)
Basophils Relative: 0 % (ref 0–1)
EOS PCT: 0 % (ref 0–5)
Eosinophils Absolute: 0 10*3/uL (ref 0.0–0.7)
HCT: 25.3 % — ABNORMAL LOW (ref 36.0–46.0)
Hemoglobin: 8.5 g/dL — ABNORMAL LOW (ref 12.0–15.0)
LYMPHS ABS: 0.7 10*3/uL (ref 0.7–4.0)
Lymphocytes Relative: 9 % — ABNORMAL LOW (ref 12–46)
MCH: 29.8 pg (ref 26.0–34.0)
MCHC: 33.6 g/dL (ref 30.0–36.0)
MCV: 88.8 fL (ref 78.0–100.0)
Monocytes Absolute: 0.7 10*3/uL (ref 0.1–1.0)
Monocytes Relative: 9 % (ref 3–12)
NEUTROS ABS: 6.6 10*3/uL (ref 1.7–7.7)
Neutrophils Relative %: 82 % — ABNORMAL HIGH (ref 43–77)
PLATELETS: 179 10*3/uL (ref 150–400)
RBC: 2.85 MIL/uL — ABNORMAL LOW (ref 3.87–5.11)
RDW: 23.8 % — AB (ref 11.5–15.5)
WBC: 8 10*3/uL (ref 4.0–10.5)

## 2014-11-26 LAB — COMPREHENSIVE METABOLIC PANEL
ALK PHOS: 491 U/L — AB (ref 39–117)
ALT: 25 U/L (ref 0–35)
AST: 54 U/L — ABNORMAL HIGH (ref 0–37)
Albumin: 1.3 g/dL — ABNORMAL LOW (ref 3.5–5.2)
Anion gap: 5 (ref 5–15)
BUN: 7 mg/dL (ref 6–23)
CHLORIDE: 99 mmol/L (ref 96–112)
CO2: 22 mmol/L (ref 19–32)
CREATININE: 0.75 mg/dL (ref 0.50–1.10)
Calcium: 7.2 mg/dL — ABNORMAL LOW (ref 8.4–10.5)
GFR calc Af Amer: >90 mL/min (ref >90)
GFR calc non Af Amer: 90 mL/min — ABNORMAL LOW (ref >90)
Glucose, Bld: 89 mg/dL (ref 70–99)
Potassium: 2.8 mmol/L — ABNORMAL LOW (ref 3.5–5.1)
Sodium: 126 mmol/L — ABNORMAL LOW (ref 135–145)
Total Bilirubin: 7.8 mg/dL — ABNORMAL HIGH (ref 0.3–1.2)
Total Protein: 7.2 g/dL (ref 6.0–8.3)

## 2014-11-26 MED ORDER — POTASSIUM CHLORIDE CRYS ER 20 MEQ PO TBCR
40.0000 meq | EXTENDED_RELEASE_TABLET | Freq: Two times a day (BID) | ORAL | Status: DC
  Filled 2014-11-26 (×2): qty 2

## 2014-11-26 MED ORDER — GADOBENATE DIMEGLUMINE 529 MG/ML IV SOLN
15.0000 mL | Freq: Once | INTRAVENOUS | Status: AC | PRN
  Administered 2014-11-26: 12 mL via INTRAVENOUS

## 2014-11-26 MED ORDER — ALBUMIN HUMAN 25 % IV SOLN
12.5000 g | Freq: Once | INTRAVENOUS | Status: AC
  Administered 2014-11-26: 12.5 g via INTRAVENOUS
  Filled 2014-11-26: qty 50

## 2014-11-26 MED ORDER — ALBUMIN HUMAN 25 % IV SOLN
50.0000 g | Freq: Once | INTRAVENOUS | Status: AC
  Administered 2014-11-26: 50 g via INTRAVENOUS
  Filled 2014-11-26: qty 200

## 2014-11-26 NOTE — Progress Notes (Signed)
[facility]                                                                              64 y.o.? adenocarcinoma of head of pancreas, status post Whipple's procedure 08/07/14, completed concurrent capecitabine and radiation, poor performance status and hence did not get adjuvant chemotherapy, recurrent Klebsiella bacteremia- attributed to intra-abdominal source/presumed septic portal vein thrombus, acute portal vein thrombosis-not on anticoagulation secondary to GI bleed, tobacco abuse & hepatitis C admit 11/14/14 with complaints of TME, abdominal distention/pain and poor appetite. gradually worsening confusion, disorientation and lethargic to a point of near unresponsiveness decreased oral intake of liquids and food - nausea or vomiting, ?abdominal lower extremity swelling   intermittent abdominal pain and no fevers.  Was on IV antibiotics through right upper extremity PICC line and therefore plans to remove PICC line and from IV Unasyn to oral cefuroxime.   on admission somnolent but arousable, afebrile, elevated blood pressure, lab work significant for potassium <2, abnormal LFTs, ammonia 120, hemoglobin 8.5, CT head without acute findings, CT abdomen and pelvis: Diffuse portal vein system thrombosis, anasarca, diffuse moderate to severe bowel wall thickening, moderate bilateral pleural effusions and fluid filled and dilated small bowel loop draining the pancreas. Hospitalist admission was requested. Since admission Patient has had paracentesis twice. Patient continues to develop fluid and requires further tapping. Discussed with radiology possible catheter placement versus [place] shunt.  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 -empiric lactulose 20 bid started 3/6  Anasarca/ascites -Secondary to liver disease, portal vein thrombosis and hypoalbuminemia with malnutrition -IR/GI and Pallitve appreciated -Patient had ultrasound-guided paracentesis with removal of 3 L (11/15/2014) -Patient empirically started on IV Unasyn 2/24 which was tapered on 3/3 2 Augmentin to cover for possible SBP-stop date 11/25/14 which will be 10 days of treatment -Spoke with pathology: Fluid cytology shows atypical cells--+ 4 Caklretinin and MT-1 -Fluid culture: no growth to date, no organisms seen -Fluid chemistry not consistent with SBP -SAAG unable to accurately calculate as patient's ascitic albumin was not a specific number, rather <1.   -Repeat US paracentesis on 11/17/2014 removed 3L- however patient's abdomen looks distended and firm paracenteses ~2.5 L 3/7 + 12.5 mg albumin given + 50 mg 3/7 -Dr. Maye Hides D/W family 11/17/2014, regarding prognosis and Code status. Palliative care consulted and appreciated  Wants Medical therapies-not indwelling drain  For now-strict I's and O's, keep indwelling Foley catheter, daily weights Lasix/Aldactione held on 3/7 in face of Hyponatremia + Hypokalemia Risky to fluid restrict. Labs am See detailed note from Dr. Donney Rankins will d/w Dr Barry Dienes and him and Dr. Pascal Lux once MRCP back as there is concern regarding potential complication from Whipple  Previous Klebsiella and streptococcal bacteremia -ID consulted and appreciated -Patient  needs to complete 42 day course of antibiotics which should have ended 11/27/14-ID recommended Cefuroxime 500mg  BID  -She completes Augmentin on 3/6 which should be enough -Thus far blood cultures and fluid cultures negative  Severe  hypokalemia -Magnesium 1.9 -Resolved, will continue to monitor in place as needed   Dehydration -Secondary to poor oral intake  -on full diet now -creatinine/ Estimated Creatinine Clearance: 61.1 mL/min (by C-G formula based on Cr of 0.75). 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 Jana Half' excellent summary -Await MRCP -long discussion with daughter 3/7 pm -Will holistically update on 3/8 once all consultants opinion can be garnered  Anemia secondary to chronic disease -Baseline approximately 8 -Hemoglobin dropped to 7.0, patient was transfused 1 unit on 2/29,  hemoglobin stable between 8-9 range-Continue to monitor CBC periodically  Hepatitis C -Currently not on treatment  Severe protein calorie malnutrition/failure to thrive -Nutrition consulted and appreciated -Patient started on clear liquid diet with nutritional supplementation  Pancreatic adenocarcinoma -S/p Whipple's procedure  ?Small bowel ileus Eating poorly but tolerating Diet since 3/5 ?TPN [risky-finishing Abx today 3/6]  Abd pain axr 3/6-stool GI started Miralax Would favour lactulose prn  Code Status: Full Family Communication: discussed with daughter # Disposition Plan: patient is currently now DNR/DNI and it is recommended to the patient to pursue a palliative care trajectory   see above note  Time Spent in minutes   15  Procedures  Ultrasound guided paracentesis 2/25 and 2/27  Consults   Interventional radiology Gastroenterology General surgery Oncology Infectious Disease Palliative care  DVT Prophylaxis  SCDs  Lab Results  Component Value Date   PLT 179 11/26/2014    Medications  Scheduled Meds: . alum & mag hydroxide-simeth  15 mL Oral Once  . antiseptic oral rinse  7 mL Mouth Rinse q12n4p  . chlorhexidine  15 mL Mouth Rinse BID  . feeding supplement  (RESOURCE BREEZE)  1 Container Oral TID BM  . furosemide  40 mg Oral Daily  . lactulose  20 g Oral BID  . OxyCODONE  15 mg Oral Q12H  . pantoprazole  40 mg Oral Daily  . polyethylene glycol  17 g Oral BID  . sodium chloride  3 mL Intravenous Q12H  . spironolactone  100 mg Oral Daily   Continuous Infusions:   PRN Meds:.acetaminophen, albuterol, bisacodyl, hydrALAZINE, morphine injection, ondansetron (ZOFRAN) IV, oxyCODONE, sodium chloride  Antibiotics    Anti-infectives    Start     Dose/Rate Route Frequency Ordered Stop   11/22/14 2200  amoxicillin-clavulanate (AUGMENTIN) 875-125 MG per tablet 1 tablet  Status:  Discontinued     1 tablet Oral Every 12 hours 11/22/14 1859 11/25/14 1800   11/15/14 0000  Ampicillin-Sulbactam (UNASYN) 3 g in sodium chloride 0.9 % 100 mL IVPB  Status:  Discontinued     3 g 100 mL/hr over 60 Minutes Intravenous 4 times per day 11/14/14 1616 11/22/14 1859   11/14/14 1615  Ampicillin-Sulbactam (UNASYN) 3 g in sodium chloride 0.9 % 100 mL IVPB     3 g 100 mL/hr over 60 Minutes Intravenous STAT 11/14/14 1609 11/14/14 1827   11/14/14 1600  ampicillin-sulbactam (UNASYN) 1.5 g in sodium chloride 0.9 % 50 mL IVPB  Status:  Discontinued     1.5 g 100 mL/hr over 30 Minutes Intravenous  Once 11/14/14 1527 11/14/14 1609        Subjective:  Abd pain earlier Better nowGassy ++ No other co   Objective:   Filed Vitals:   11/26/14 1028 11/26/14 1040 11/26/14 1100 11/26/14 1409  BP: 108/70 111/69  133/71  Pulse:    96  Temp:   98.3 F (36.8 C) 98.9 F (37.2 C)  TempSrc:   Oral Oral  Resp:    18  Height:      Weight:      SpO2:    100%    Wt Readings from Last 3 Encounters:  11/25/14 59.5 kg (131 lb 2.8 oz)  10/29/14 50.395 kg (111 lb 1.6 oz)  10/17/14 45.6 kg (100 lb 8.5 oz)     Intake/Output Summary (Last 24 hours) at 11/26/14 1806 Last data filed at 11/26/14 1440  Gross per 24 hour  Intake    120 ml  Output   1150 ml  Net  -1030 ml     Exam  General: Well developed, thin, cachectic , temporalis wasting, supraclavicular wasting   Cardiovascular: S1 S2 auscultated,RRR  Respiratory: Diminished breath sounds  Abdomen: Firm, distended, diffusely tender, hypoactive bowel sounds-non tedner currentyl   lower extremities are swollen    Data Review   Micro Results No results found for this or any previous visit (from the past 240 hour(s)).  Radiology Reports Dg Abd 1 View  11/25/2014   CLINICAL DATA:  Generalized abdominal pain and distention. Pancreatic carcinoma. Previous Whipple procedure.  EXAM: ABDOMEN - 1 VIEW  COMPARISON:  11/16/2014  FINDINGS: Mild generalized gaseous distention of the colon is seen. Moderate amount stool is noted in the ascending and transverse portions of the colon. No definite evidence of small bowel obstruction. Crowding of bowel loops is seen in the central abdomen suggesting ascites. Surgical clips in stent also seen within the mid abdomen, likely related to previous Whipple procedure.  IMPRESSION: Suspect mild colonic ileus. Moderate colonic stool burden also noted. Probable ascites.   Electronically Signed   By: Earle Gell M.D.   On: 11/25/2014 10:55   Dg Abd 1 View  11/16/2014   CLINICAL DATA:  Abdominal pain and distention.  EXAM: ABDOMEN - 1 VIEW  COMPARISON:  CT on 11/14/2014  FINDINGS: Chronic and bowel loops in the central abdomen is consistent with ascites. Surgical clips and piece of catheter or stent again seen in right upper quadrant. Mild dilatation of small bowel loops and colon is seen in the central abdomen likely due to on adynamic ileus. Some bowel gas in contrast is seen in the rectum.  IMPRESSION: Ascites and probable adynamic ileus.   Electronically Signed   By: Earle Gell M.D.   On: 11/16/2014 16:06   Ct Head Wo Contrast  11/14/2014   CLINICAL DATA:  Altered mental status  EXAM: CT HEAD WITHOUT CONTRAST  TECHNIQUE: Contiguous axial images were obtained from the base of the  skull through the vertex without intravenous contrast.  COMPARISON:  October 27, 2005  FINDINGS: The ventricles are normal in size and configuration. There is no mass, hemorrhage, extra-axial fluid collection, or midline shift. Gray-white compartments are normal. No acute infarct apparent. The bony calvarium appears intact. The mastoid air cells are clear.  IMPRESSION: No intracranial mass, hemorrhage, or focal gray -white compartment lesions/acute appearing infarct.   Electronically Signed   By: Lowella Grip III M.D.   On: 11/14/2014 14:17   US Abdomen Complete  11/14/2014   CLINICAL DATA:  Pancreatic cancer post Whipple procedure 08/07/2014, abdominal swelling for 1 week, prior cholecystectomy, hypertension,  radiation therapy, smoker  EXAM: ULTRASOUND ABDOMEN COMPLETE  COMPARISON:  CT abdomen and pelvis 10/16/2014  FINDINGS: Gallbladder: Surgically absent  Common bile duct: Diameter: 5 mm diameter, normal  Liver: Heterogeneous in echogenicity. No discrete mass. Portal vein is not well demonstrated ; portal vein demonstrated thrombosis on the prior CT.  IVC: Intrahepatic portion normal appearance. Infrahepatic portion nonvisualized due to bowel gas.  Pancreas: Post Whipple procedure by history. Pancreatic bed inadequately visualized due to bowel gas.  Spleen: Normal appearance, 5.0 cm length  Right Kidney: Length: 9.3 cm. Normal morphology without mass or hydronephrosis.  Left Kidney: Length: 10.0 cm. Normal morphology without mass or hydronephrosis.  Abdominal aorta: Proximally normal caliber, remainder obscured by bowel gas  Other findings: Significant ascites throughout abdomen and pelvis.  IMPRESSION: Significant ascites significantly increased since prior CT exam, likely cause of abdominal swelling.  Limited exam due to bowel gas, with inadequate visualization of the pancreatic bed, portal vein and portions of the aorta.  Post cholecystectomy.   Electronically Signed   By: Lavonia Dana M.D.   On:  11/14/2014 14:32   Ct Abdomen Pelvis W Contrast  11/14/2014   CLINICAL DATA:  64 year old female with abdominal pain and distension. Current history of pancreatic cancer status post Whipple and radiation. Subsequent encounter.  EXAM: CT ABDOMEN AND PELVIS WITH CONTRAST  TECHNIQUE: Multidetector CT imaging of the abdomen and pelvis was performed using the standard protocol following bolus administration of intravenous contrast.  CONTRAST:  167mL OMNIPAQUE IOHEXOL 300 MG/ML  SOLN  COMPARISON:  10/16/2014 and earlier.  FINDINGS: New moderate size bilateral pleural effusions with lower lobe atelectasis. No pericardial effusion.  Advanced degenerative changes in the lower lumbar spine. No acute osseous abnormality identified.  Increased body wall stranding and density in keeping with a degree of anasarca. Moderate to large volume of ascites in the abdomen has significantly progressed, and in the upper abdomen this is associated with new lobular mass effect on the liver contour (series 2, image 17).  No dilated large bowel. Widespread small and large bowel wall thickening. Pancreatic or biliary stent re- identified within a small bowel loop in the right upper quadrant which today is dilated with fluid up to 4 cm diameter. There is continued intra and extrahepatic biliary ductal dilatation. Pancreatic tail atrophy again noted.  In January of the main portal vein was thrombosed. On today images no portal venous system enhancement is identified.  The stomach is thick walled. Oral contrast was administered, and has reached mid small bowel loops.  Major arterial structures in the abdomen and pelvis appear patent. Aortoiliac calcified atherosclerosis noted. Renal enhancement and contrast excretion is within normal limits.  IMPRESSION: 1. Progression of disease. Progressed and now diffuse portal venous system thrombosis since January. 2. Anasarca. Large volume ascites, mass effect on the liver may reflect subcapsular or  loculated ascites. Diffuse moderate to severe bowel wall thickening. Moderate bilateral pleural effusions. 3. Fluid-filled and dilated small bowel loop draining the pancreas.   Electronically Signed   By: Genevie Ann M.D.   On: 11/14/2014 14:42   US Abdomen Limited  11/16/2014   CLINICAL DATA:  Abdominal distention question ascites, history Whipple procedure, hypertension, pancreatic cancer  EXAM: LIMITED ABDOMEN ULTRASOUND FOR ASCITES  TECHNIQUE: Limited ultrasound survey for ascites was performed in all four abdominal quadrants.  COMPARISON:  11/14/2014 CT abdomen and pelvis  FINDINGS: Significant ascites is identified in both lower quadrants.  Smaller amount of ascites is seen in the LEFT upper quadrant,  with even less in the perihepatic region.  IMPRESSION: Significant ascites.   Electronically Signed   By: Lavonia Dana M.D.   On: 11/16/2014 17:34   US Paracentesis  11/26/2014   INDICATION: History of pancreatic cancer s/p Whipple, portal HTN, recurrent ascites and request for paracentesis.  EXAM: ULTRASOUND-GUIDED PARACENTESIS  COMPARISON:  11/20/14 paracentesis.  MEDICATIONS: None.  COMPLICATIONS: None immediate  TECHNIQUE: Informed written consent was obtained from the patient after a discussion of the risks, benefits and alternatives to treatment. A timeout was performed prior to the initiation of the procedure.  Initial ultrasound scanning demonstrates a small to moderate amount of ascites within the left lower abdominal quadrant. The left lower abdomen was prepped and draped in the usual sterile fashion. 1% lidocaine was used for local anesthesia. Under direct ultrasound guidance, a 19 gauge, 7-cm, Yueh catheter was introduced. An ultrasound image was saved for documentation purposed. The paracentesis was performed. The catheter was removed and a dressing was applied. The patient tolerated the procedure well without immediate post procedural complication.  FINDINGS: A total of approximately 2.5 liters of  serous fluid was removed.  IMPRESSION: Successful ultrasound-guided paracentesis yielding 2.5 liters of peritoneal fluid.  Read By:  Tsosie Billing PA-C   Electronically Signed   By: Sandi Mariscal M.D.   On: 11/26/2014 11:05   US Paracentesis  11/20/2014   INDICATION: Pancreatic cancer, hepatitis-C, ascites. Request is made for diagnostic and therapeutic paracentesis.  EXAM: ULTRASOUND-GUIDED DIAGNOSTIC AND THERAPEUTIC PARACENTESIS  COMPARISON:  Prior paracentesis on 11/17/2014  MEDICATIONS: None.  COMPLICATIONS: None immediate  TECHNIQUE: Informed written consent was obtained from the patient after a discussion of the risks, benefits and alternatives to treatment. A timeout was performed prior to the initiation of the procedure.  Initial ultrasound scanning demonstrates a small amount of ascites within the right lower abdominal quadrant. The right lower abdomen was prepped and draped in the usual sterile fashion. 1% lidocaine was used for local anesthesia. Under direct ultrasound guidance, a 19 gauge, 7-cm, Yueh catheter was introduced. An ultrasound image was saved for documentation purposed. The paracentesis was performed. The catheter was removed and a dressing was applied. The patient tolerated the procedure well without immediate post procedural complication.  FINDINGS: A total of approximately 1.7 liters of clear, yellow fluid was removed. Samples were sent to the laboratory as requested by the clinical team.  IMPRESSION: Successful ultrasound-guided diagnostic and therapeutic paracentesis yielding 1.7 liters of peritoneal fluid.  Read by: Rowe Robert, PA-C   Electronically Signed   By: Jerilynn Mages.  Shick M.D.   On: 11/20/2014 10:53   US Paracentesis  11/17/2014   INDICATION: ascites  CLINICAL DATA:  Request for therapeutic paracentesis  EXAM: ULTRASOUND-GUIDED PARACENTESIS  ULTRASOUND GUIDED RLQ PARACENTESIS  COMPARISON:  None.  MEDICATIONS: 10 cc 1% lidocaine  COMPLICATIONS: None immediate  TECHNIQUE: Informed  written consent was obtained from the patient after a discussion of the risks, benefits and alternatives to treatment. A timeout was performed prior to the initiation of the procedure.  Initial ultrasound scanning demonstrates a large amount of ascites within the right lower abdominal quadrant. The right lower abdomen was prepped and draped in the usual sterile fashion. 1% lidocaine with epinephrine was used for local anesthesia. Under direct ultrasound guidance, a 19 gauge, 7-cm, Yueh catheter was introduced. An ultrasound image was saved for documentation purposed. the paracentesis was performed. The catheter was removed and a dressing was applied. The patient tolerated the procedure well without immediate post procedural complication.  FINDINGS: A total of approximately 3 liters of serous fluid was removed.  IMPRESSION: Successful ultrasound-guided paracentesis yielding 3 liters of peritoneal fluid.  PROCEDURE: Ultrasound was performed to localize and mark an adequate pocket of fluid in the right quadrant of the abdomen. The area was then prepped and draped in the normal sterile fashion. 1% Lidocaine was used for local anesthesia. Under ultrasound guidance a 19 gauge Yueh catheter was introduced. Paracentesis was performed. The catheter was removed and a dressing applied.  Read by:  Lavonia Drafts [facility]   Electronically Signed   By: Jacqulynn Cadet M.D.   On: 11/17/2014 11:31   US Paracentesis  11/15/2014   CLINICAL DATA:  Ascites, history of pancreatic cancer. Request therapeutic and diagnostic paracentesis of up to 3 L max.  EXAM: ULTRASOUND GUIDED PARACENTESIS  COMPARISON:  None.  PROCEDURE: An ultrasound guided paracentesis was thoroughly discussed with the patient and questions answered. The benefits, risks, alternatives and complications were also discussed. The patient understands and wishes to proceed with the procedure. Written consent was obtained.  Ultrasound was performed to localize and mark an  adequate pocket of fluid in the left lower quadrant of the abdomen. The area was then prepped and draped in the normal sterile fashion. 1% Lidocaine was used for local anesthesia. Under ultrasound guidance a 19 gauge Yueh catheter was introduced. Paracentesis was performed. The catheter was removed and a dressing applied.  COMPLICATIONS: None immediate  FINDINGS: A total of approximately 3 L of clear yellow fluid was removed. A fluid sample was sent for laboratory analysis.  IMPRESSION: Successful ultrasound guided paracentesis yielding 3 L of ascites.  Read by: Ascencion Dike PA-C   Electronically Signed   By: Corrie Mckusick D.O.   On: 11/15/2014 12:55    CBC  Recent Labs Lab 11/20/14 0500 11/21/14 0530 11/22/14 0505 11/25/14 0820 11/26/14 0753  WBC 5.4 4.9 5.0 6.8 8.0  HGB 9.2* 8.5* 8.8* 8.7* 8.5*  HCT 27.0* 25.5* 26.9* 26.0* 25.3*  PLT 196 153 167 163 179  MCV 86.8 89.2 90.0 89.7 88.8  MCH 29.6 29.7 29.4 30.0 29.8  MCHC 34.1 33.3 32.7 33.5 33.6  RDW 22.8* 23.3* 23.8* 23.9* 23.8*  LYMPHSABS  --   --  0.8 0.8 0.7  MONOABS  --   --  0.6 0.7 0.7  EOSABS  --   --  0.2 0.1 0.0  BASOSABS  --   --  0.0 0.0 0.0    Chemistries   Recent Labs Lab 11/21/14 0530 11/22/14 0505 11/23/14 0850 11/24/14 0535 11/25/14 0820 11/26/14 0535  NA 139 136 133* 129* 129* 126*  K 4.4 4.4 4.2 3.8 3.2* 2.8*  CL 118* 112 109 103 102 99  CO2 19 21 21 21 22 22   GLUCOSE 93 85 73 73 69* 89  BUN <5* 5* 6 6 7 7   CREATININE <0.30* 0.44* 0.49* 0.62 0.73 0.75  CALCIUM 7.6* 7.8* 7.9* 7.7* 7.7* 7.2*  AST 59* 54* 58*  --  57* 54*  ALT 32 29 32  --  28 25  ALKPHOS 541* 541* 517*  --  521* 491*  BILITOT 8.0* 8.0* 8.5*  --  8.1* 7.8*   ------------------------------------------------------------------------------------------------------------------ estimated creatinine clearance is 61.1 mL/min (by C-G formula based on Cr of  0.75). ------------------------------------------------------------------------------------------------------------------ No results for input(s): HGBA1C in the last 72 hours. ------------------------------------------------------------------------------------------------------------------ No results for input(s): CHOL, HDL, LDLCALC, TRIG, CHOLHDL, LDLDIRECT in the last 72 hours. ------------------------------------------------------------------------------------------------------------------ No results for input(s): TSH, T4TOTAL, T3FREE, THYROIDAB  in the last 72 hours.  Invalid input(s): FREET3 ------------------------------------------------------------------------------------------------------------------ No results for input(s): VITAMINB12, FOLATE, FERRITIN, TIBC, IRON, RETICCTPCT in the last 72 hours.  Coagulation profile  Recent Labs Lab 11/25/14 0820  INR 1.44    No results for input(s): DDIMER in the last 72 hours.  Cardiac Enzymes No results for input(s): CKMB, TROPONINI, MYOGLOBIN in the last 168 hours.  Invalid input(s): CK ------------------------------------------------------------------------------------------------------------------ Invalid input(s): POCBNP  Verneita Griffes, MD [facility] [phone]

## 2014-11-26 NOTE — Progress Notes (Signed)
     [facility] Progress Note  Subjective:   Less abd pain, but still with intermittent pain. No N/V.    Objective:  Vital signs in last 24 hours: Temp:  [98.2 F (36.8 C)-98.5 F (36.9 C)] 98.2 F (36.8 C) (03/07 0506) Pulse Rate:  [78-105] 105 (03/07 0506) Resp:  [16-18] 16 (03/07 0506) BP: (114-120)/(67-78) 114/72 mmHg (03/07 0506) SpO2:  [97 %-99 %] 99 % (03/07 0506) Last BM Date: 11/22/14 General:   Alert, thin, black female in NAD Heart:  Regular rate and rhythm; no murmurs Pulm;lungs clear Abdomen:  Soft , mildly distended, diffusely tender quiet bowel sounds   Extremities:  BLE edema Neurologic:  Alert and  oriented x4;  grossly normal neurologically. Psych:  Alert and cooperative. Normal mood and affect.  Intake/Output from previous day: 03/06 0701 - 03/07 0700 In: 240 [P.O.:240] Out: 1950 [Urine:1950] Intake/Output this shift:    Lab Results:  Recent Labs  11/25/14 0820 11/26/14 0753  WBC 6.8 8.0  HGB 8.7* 8.5*  HCT 26.0* 25.3*  PLT 163 179   BMET  Recent Labs  11/24/14 0535 11/25/14 0820 11/26/14 0535  NA 129* 129* 126*  K 3.8 3.2* 2.8*  CL 103 102 99  CO2 21 22 22   GLUCOSE 73 69* 89  BUN 6 7 7   CREATININE 0.62 0.73 0.75  CALCIUM 7.7* 7.7* 7.2*   LFT  Recent Labs  11/26/14 0535  PROT 7.2  ALBUMIN 1.3*  AST 54*  ALT 25  ALKPHOS 491*  BILITOT 7.8*   PT/INR  Recent Labs  11/25/14 0820  LABPROT 17.7*  INR 1.44   Hepatitis Panel No results for input(s): HEPBSAG, HCVAB, HEPAIGM, HEPBIGM in the last 72 hours.  Dg Abd 1 View  11/25/2014   CLINICAL DATA:  Generalized abdominal pain and distention. Pancreatic carcinoma. Previous Whipple procedure.  EXAM: ABDOMEN - 1 VIEW  COMPARISON:  11/16/2014  FINDINGS: Mild generalized gaseous distention of the colon is seen. Moderate amount stool is noted in the ascending and transverse portions of the colon. No definite evidence of small bowel obstruction. Crowding of bowel loops  is seen in the central abdomen suggesting ascites. Surgical clips in stent also seen within the mid abdomen, likely related to previous Whipple procedure.  IMPRESSION: Suspect mild colonic ileus. Moderate colonic stool burden also noted. Probable ascites.   Electronically Signed   By: Earle Gell M.D.   On: 11/25/2014 10:55    ASSESSMENT/PLAN:   64 year old female with pancreatic cancer, s/p whipple Nov 2015. Post-op course complicated by recurrent hospitalizations for bacteremia, PV thrombosis and general decline. Now with ascites, likely secondary to portal HTN but malignancy not excluded. Cytology reveals atypical cells.   Diuretics have been initiated, hopefully this will slow down rate of ascites accumulation. on lasix 40 / aldactone 100.  Renal function normal but creatinine has been creeping up. BUn/creat today 7/0.75  IR will re-evaluate for [place] shunt or tunneled external catheter if ascites not manageable withdiuretics.  For ultrasound today to evaluate for increasing ascites.   LOS: 12 days   Daylynn Stumpp, Vita Barley PA-C 11/26/2014, Pager [phone]

## 2014-11-26 NOTE — Progress Notes (Signed)
Pt refused all her po meds that were due at 2200, as well as her Maalox order I obtained for her per her request. I offered the meds to her at a later time, and she said " I don't want anything right now."

## 2014-11-26 NOTE — Procedures (Addendum)
Successful US guided paracentesis from [facility].  Yielded 2.5 liters of serous fluid.  No immediate complications.  Pt tolerated well.   Specimen was not sent for labs.  Tsosie Billing D PA-C 11/26/2014 11:05 AM

## 2014-11-26 NOTE — Progress Notes (Signed)
Order received from MD [employer] for Albumin 25% 50 ml once, order entered Neta Mends RN 11-26-2014 11:50am

## 2014-11-26 NOTE — Progress Notes (Signed)
Occupational Therapy Treatment Patient Details Name: Melinda Conrad MRN: [id number] DOB: [birth year] Today's Date: 11/26/2014    History of present illness 64 yo female admitted 11/14/14 with Altered mental status, abdominal distention and pain and poor appetite, patient has h/o pancreatic cancer, portal vein thrombosis,    OT comments  OT provided MAX encouragement to get OOB. Pt refused  Follow Up Recommendations  SNF    Equipment Recommendations       Recommendations for Other Services      Precautions / Restrictions Precautions Precautions: Fall Restrictions Weight Bearing Restrictions: No       Mobility Bed Mobility Overal bed mobility: Needs Assistance             General bed mobility comments: refused  Transfers Overall transfer level: Needs assistance               General transfer comment: refused    Balance                                   ADL Overall ADL's : Needs assistance/impaired     Grooming: Wash/dry face;Bed level                                 General ADL Comments: pt refused OOB even with MAX encouragment- but did agreee to grooming and BUE exercise.  Pt will need significant A at home or SNF.  Explained role of OT and reasons for participating.        Vision                     Perception     Praxis      Cognition   Behavior During Therapy: WFL for tasks assessed/performed Overall Cognitive Status: Within Functional Limits for tasks assessed                       Extremity/Trunk Assessment               Exercises     Shoulder Instructions       General Comments      Pertinent Vitals/ Pain       Faces Pain Scale: Hurts little more Pain Location: tummy Pain Descriptors / Indicators: Aching  Home Living                                          Prior Functioning/Environment              Frequency       Progress Toward Goals  OT  Goals(current goals can now be found in the care plan section)  Progress towards OT goals: Not progressing toward goals - comment (due to lack of particaption)  ADL Goals Pt/caregiver will Perform Home Exercise Program: Increased strength;Both right and left upper extremity;Independently  Plan      Co-evaluation                 End of Session     Activity Tolerance Patient tolerated treatment well   Patient Left in bed;with call bell/phone within reach   Nurse Communication          Time: 0347-4259 OT Time Calculation (min): 11 min  Charges:  OT General Charges $OT Visit: 1 Procedure OT Treatments $Therapeutic Exercise: 8-22 mins  Issabela Lesko, Thereasa Parkin 11/26/2014, 11:37 AM

## 2014-11-27 LAB — COMPREHENSIVE METABOLIC PANEL
ALK PHOS: 335 U/L — AB (ref 39–117)
ALT: 19 U/L (ref 0–35)
AST: 40 U/L — ABNORMAL HIGH (ref 0–37)
Albumin: 2.3 g/dL — ABNORMAL LOW (ref 3.5–5.2)
Anion gap: 7 (ref 5–15)
BUN: 7 mg/dL (ref 6–23)
CO2: 24 mmol/L (ref 19–32)
Calcium: 7.9 mg/dL — ABNORMAL LOW (ref 8.4–10.5)
Chloride: 101 mmol/L (ref 96–112)
Creatinine, Ser: 0.56 mg/dL (ref 0.50–1.10)
GFR calc Af Amer: >90 mL/min (ref >90)
GFR calc non Af Amer: >90 mL/min (ref >90)
GLUCOSE: 80 mg/dL (ref 70–99)
POTASSIUM: 2.5 mmol/L — AB (ref 3.5–5.1)
Sodium: 132 mmol/L — ABNORMAL LOW (ref 135–145)
Total Bilirubin: 8.7 mg/dL — ABNORMAL HIGH (ref 0.3–1.2)
Total Protein: 6.7 g/dL (ref 6.0–8.3)

## 2014-11-27 MED ORDER — POTASSIUM CHLORIDE 10 MEQ/100ML IV SOLN
10.0000 meq | INTRAVENOUS | Status: AC
  Administered 2014-11-27 (×4): 10 meq via INTRAVENOUS
  Filled 2014-11-27 (×4): qty 100

## 2014-11-27 MED ORDER — POTASSIUM CHLORIDE CRYS ER 20 MEQ PO TBCR
40.0000 meq | EXTENDED_RELEASE_TABLET | Freq: Two times a day (BID) | ORAL | Status: DC
  Administered 2014-11-27 – 2014-11-28 (×3): 40 meq via ORAL
  Filled 2014-11-27 (×5): qty 2

## 2014-11-27 NOTE — Progress Notes (Signed)
[facility]                                                                              64 y.o.? adenocarcinoma of head of pancreas, status post Whipple's procedure 08/07/14, completed concurrent capecitabine and radiation, poor performance status and hence did not get adjuvant chemotherapy, recurrent Klebsiella bacteremia- attributed to intra-abdominal source/presumed septic portal vein thrombus, acute portal vein thrombosis-not on anticoagulation secondary to GI bleed, tobacco abuse & hepatitis C admit 11/14/14 with complaints of TME, abdominal distention/pain and poor appetite. gradually worsening confusion, disorientation and lethargic to a point of near unresponsiveness decreased oral intake of liquids and food - nausea or vomiting, ?abdominal lower extremity swelling   intermittent abdominal pain and no fevers.  Was on IV antibiotics through right upper extremity PICC line and therefore plans to remove PICC line and from IV Unasyn to oral cefuroxime.   on admission somnolent but arousable, afebrile, elevated blood pressure, lab work significant for potassium <2, abnormal LFTs, ammonia 120, hemoglobin 8.5, CT head without acute findings, CT abdomen and pelvis: Diffuse portal vein system thrombosis, anasarca, diffuse moderate to severe bowel wall thickening, moderate bilateral pleural effusions and fluid filled and dilated small bowel loop draining the pancreas. Hospitalist admission was requested. Since admission Patient has had paracentesis twice. Patient continues to develop fluid and requires further tapping. Discussed with radiology possible catheter placement versus [place] shunt.  It was felt on 3/7 that she might have a remnant of Portal Vn thrombosis MRCP on 3/8 confirmed significant for hypertension and extensive portal venous clots in most of the liver architecture likely causing acute liver disease This patient has a very poor prognosis moving forward. 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 -empiric lactulose 20 bid started 3/6  Anasarca/ascites -Secondary to liver disease, portal vein thrombosis and hypoalbuminemia with malnutrition -IR/GI and Pallitve appreciated -Patient had ultrasound-guided paracentesis with removal of 3 L (11/15/2014) -Patient empirically started on IV Unasyn 2/24 which was tapered on 3/3 2 Augmentin to cover for possible SBP-stop date 11/25/14 which will be 10 days of treatment -Spoke with pathology: Fluid cytology shows atypical cells--+ 4 Caklretinin and MT-1 -Fluid culture: no growth to date, no organisms seen -Fluid chemistry not consistent with SBP -SAAG unable to accurately calculate as patient's ascitic albumin was not a specific number, rather <1.   -Repeat US paracentesis on 11/17/2014 removed 3L- however patient's abdomen looks distended and firm paracenteses ~2.5 L 3/7 + 12.5 mg albumin given + 50 mg 3/7 Lasix/Aldactione held on 3/7 in face of Hyponatremia + Hypokalemia -Dr. Maye Hides D/W family 11/17/2014, regarding prognosis and Code status. Palliative care consulted and appreciated  We have had to take a 180 degree turn today -she no longer is a candidate for diuresis as his sodium was 126 -We'll back to deciding whether she would benefit from a [place] shunt, indwelling drain, intermittent paracentesis -I've explained these options to the  patient and her daughter at the bedside and spent 25 minutes doing this and explained multiple times. Patient understands she is sick but does not understand her options very well. -I had a long discussion with gastroenterologist Dr.Pyrtle and he concurs there are very few options left. Interventional radiology has been  reconsulted to explain what types of options are available I will personally call palliative medicine tomorrow and asked them to help navigate this complicated case   Previous Klebsiella and streptococcal bacteremia -ID consulted and appreciated -Patient needs to complete 42 day course of antibiotics which should have ended 11/27/14-ID recommended Cefuroxime 500mg  BID  -She completes Augmentin on 3/6 which should be enough--no more antibiotic  Severe hypokalemia -Magnesium 1.9 -Resolved, will continue to monitor in place as needed   Dehydration -Secondary to poor oral intake  -on full diet now -creatinine/ Estimated Creatinine Clearance: 61.1 mL/min (by C-G formula based on Cr of 0.56). 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 Garth Schlatter' excellent summary of current situation from note 3/7, 3/8 -long discussion with daughter 3/7 pm  Anemia secondary to chronic disease -Baseline approximately 8 -Hemoglobin dropped to 7.0, patient was transfused 1 unit on 2/29,  hemoglobin stable between 8-9 range-Continue to monitor CBC periodically  Hepatitis C -Currently not on treatment  Severe protein calorie malnutrition/failure to thrive -Nutrition consulted and appreciated -Patient started on clear liquid diet with nutritional supplementation  Pancreatic adenocarcinoma -S/p Whipple's procedure -Appreciate input from Dr. Barry Dienes  ?Small bowel ileus Eating poorly but tolerating Diet since 3/5 ? Doubtful she is a candidate for TPN [finished  ABX 3/6]  Abd pain axr 3/6-stool GI started Miralax Would favour lactulose prn  Code Status: Full Family Communication: discussed with daughter # Disposition Plan: 35 minute discussion with family consultants and daughter. No  real good option in site 4 life prolongation-as noted previously in notes from interventional radiology, if she has an indwelling drain her lifespan is prohibitively shortened If she has a [place] shunt, there complications of this clotting off secondary to mesothelial cells-likely the cells do not appear to be carcinomatous We will need hospice, gastroenterology, interventional radiology to formulate a consensus plan moving forward in terms of what his next for this poor lady. I do not foresee any good outcome. I explained that very clearly to the family and to the patient who I do not think completely understands but gets the chest that she has a limited life span. I will speak to palliative care personally on the a.m. 11/28/14  Time Spent in minutes   55  Procedures  Ultrasound guided paracentesis 2/25 and 2/27  Consults   Interventional radiology Gastroenterology General surgery Oncology Infectious Disease Palliative care  DVT Prophylaxis  SCDs  Lab Results  Component Value Date   PLT 179 11/26/2014    Medications  Scheduled Meds: . alum & mag hydroxide-simeth  15 mL Oral Once  . antiseptic oral rinse  7 mL Mouth Rinse q12n4p  . chlorhexidine  15 mL Mouth Rinse BID  . feeding supplement (RESOURCE BREEZE)  1 Container Oral TID BM  . lactulose  20 g Oral BID  . OxyCODONE  15 mg Oral Q12H  . pantoprazole  40 mg Oral Daily  . polyethylene glycol  17 g Oral BID  . potassium chloride  40 mEq Oral  BID WC  . sodium chloride  3 mL Intravenous Q12H   Continuous Infusions:   PRN Meds:.acetaminophen, albuterol, bisacodyl, hydrALAZINE, morphine injection, ondansetron (ZOFRAN) IV, oxyCODONE, sodium chloride  Antibiotics    Anti-infectives    Start     Dose/Rate Route Frequency Ordered Stop   11/22/14 2200  amoxicillin-clavulanate (AUGMENTIN) 875-125 MG per tablet 1 tablet  Status:  Discontinued     1 tablet Oral Every 12 hours 11/22/14 1859 11/25/14 1800   11/15/14 0000   Ampicillin-Sulbactam (UNASYN) 3 g in sodium chloride 0.9 % 100 mL IVPB  Status:  Discontinued     3 g 100 mL/hr over 60 Minutes Intravenous 4 times per day 11/14/14 1616 11/22/14 1859   11/14/14 1615  Ampicillin-Sulbactam (UNASYN) 3 g in sodium chloride 0.9 % 100 mL IVPB     3 g 100 mL/hr over 60 Minutes Intravenous STAT 11/14/14 1609 11/14/14 1827   11/14/14 1600  ampicillin-sulbactam (UNASYN) 1.5 g in sodium chloride 0.9 % 50 mL IVPB  Status:  Discontinued     1.5 g 100 mL/hr over 30 Minutes Intravenous  Once 11/14/14 1527 11/14/14 1609        Subjective:   Doing okay, tolerating some clears and food but not much Abdominal pain is reasonable No lower extremity edema cough and fever cold See above discussion   Objective:   Filed Vitals:   11/26/14 2103 11/27/14 0540 11/27/14 0656 11/27/14 1400  BP: 115/64 102/56  94/64  Pulse: 114 91  77  Temp: 99.8 F (37.7 C) 98.6 F (37 C)  98 F (36.7 C)  TempSrc: Oral Oral  Oral  Resp: 18 18  18   Height:      Weight:   53.1 kg (117 lb 1 oz)   SpO2: 100% 100%  100%    Wt Readings from Last 3 Encounters:  11/27/14 53.1 kg (117 lb 1 oz)  11/26/14 59.421 kg (131 lb)  10/29/14 50.395 kg (111 lb 1.6 oz)     Intake/Output Summary (Last 24 hours) at 11/27/14 1808 Last data filed at 11/27/14 1400  Gross per 24 hour  Intake    480 ml  Output   3450 ml  Net  -2970 ml    Exam  General: Well developed, thin, cachectic , temporalis wasting, supraclavicular wasting   Cardiovascular: S1 S2 auscultated,RRR  Respiratory: Diminished breath sounds  Abdomen: Firm, distended, diffusely tender, hypoactive bowel sounds-non tedner currentyl   lower extremities are swollen    Mild tremor noted   Data Review   Micro Results No results found for this or any previous visit (from the past 240 hour(s)).  Radiology Reports Dg Abd 1 View  11/25/2014   CLINICAL DATA:  Generalized abdominal pain and distention. Pancreatic carcinoma.  Previous Whipple procedure.  EXAM: ABDOMEN - 1 VIEW  COMPARISON:  11/16/2014  FINDINGS: Mild generalized gaseous distention of the colon is seen. Moderate amount stool is noted in the ascending and transverse portions of the colon. No definite evidence of small bowel obstruction. Crowding of bowel loops is seen in the central abdomen suggesting ascites. Surgical clips in stent also seen within the mid abdomen, likely related to previous Whipple procedure.  IMPRESSION: Suspect mild colonic ileus. Moderate colonic stool burden also noted. Probable ascites.   Electronically Signed   By: Earle Gell M.D.   On: 11/25/2014 10:55   Dg Abd 1 View  11/16/2014   CLINICAL DATA:  Abdominal pain and distention.  EXAM: ABDOMEN - 1 VIEW  COMPARISON:  CT on 11/14/2014  FINDINGS: Chronic and bowel loops in the central abdomen is consistent with ascites. Surgical clips and piece of catheter or stent again seen in right upper quadrant. Mild dilatation of small bowel loops and colon is seen in the central abdomen likely due to on adynamic ileus. Some bowel gas in contrast is seen in the rectum.  IMPRESSION: Ascites and probable adynamic ileus.   Electronically Signed   By: Earle Gell M.D.   On: 11/16/2014 16:06   Ct Head Wo Contrast  11/14/2014   CLINICAL DATA:  Altered mental status  EXAM: CT HEAD WITHOUT CONTRAST  TECHNIQUE: Contiguous axial images were obtained from the base of the skull through the vertex without intravenous contrast.  COMPARISON:  October 27, 2005  FINDINGS: The ventricles are normal in size and configuration. There is no mass, hemorrhage, extra-axial fluid collection, or midline shift. Gray-white compartments are normal. No acute infarct apparent. The bony calvarium appears intact. The mastoid air cells are clear.  IMPRESSION: No intracranial mass, hemorrhage, or focal gray -white compartment lesions/acute appearing infarct.   Electronically Signed   By: Lowella Grip III M.D.   On: 11/14/2014 14:17    US Abdomen Complete  11/14/2014   CLINICAL DATA:  Pancreatic cancer post Whipple procedure 08/07/2014, abdominal swelling for 1 week, prior cholecystectomy, hypertension, radiation therapy, smoker  EXAM: ULTRASOUND ABDOMEN COMPLETE  COMPARISON:  CT abdomen and pelvis 10/16/2014  FINDINGS: Gallbladder: Surgically absent  Common bile duct: Diameter: 5 mm diameter, normal  Liver: Heterogeneous in echogenicity. No discrete mass. Portal vein is not well demonstrated ; portal vein demonstrated thrombosis on the prior CT.  IVC: Intrahepatic portion normal appearance. Infrahepatic portion nonvisualized due to bowel gas.  Pancreas: Post Whipple procedure by history. Pancreatic bed inadequately visualized due to bowel gas.  Spleen: Normal appearance, 5.0 cm length  Right Kidney: Length: 9.3 cm. Normal morphology without mass or hydronephrosis.  Left Kidney: Length: 10.0 cm. Normal morphology without mass or hydronephrosis.  Abdominal aorta: Proximally normal caliber, remainder obscured by bowel gas  Other findings: Significant ascites throughout abdomen and pelvis.  IMPRESSION: Significant ascites significantly increased since prior CT exam, likely cause of abdominal swelling.  Limited exam due to bowel gas, with inadequate visualization of the pancreatic bed, portal vein and portions of the aorta.  Post cholecystectomy.   Electronically Signed   By: Lavonia Dana M.D.   On: 11/14/2014 14:32   Ct Abdomen Pelvis W Contrast  11/14/2014   CLINICAL DATA:  64 year old female with abdominal pain and distension. Current history of pancreatic cancer status post Whipple and radiation. Subsequent encounter.  EXAM: CT ABDOMEN AND PELVIS WITH CONTRAST  TECHNIQUE: Multidetector CT imaging of the abdomen and pelvis was performed using the standard protocol following bolus administration of intravenous contrast.  CONTRAST:  150mL OMNIPAQUE IOHEXOL 300 MG/ML  SOLN  COMPARISON:  10/16/2014 and earlier.  FINDINGS: New moderate size  bilateral pleural effusions with lower lobe atelectasis. No pericardial effusion.  Advanced degenerative changes in the lower lumbar spine. No acute osseous abnormality identified.  Increased body wall stranding and density in keeping with a degree of anasarca. Moderate to large volume of ascites in the abdomen has significantly progressed, and in the upper abdomen this is associated with new lobular mass effect on the liver contour (series 2, image 17).  No dilated large bowel. Widespread small and large bowel wall thickening. Pancreatic or biliary stent re- identified within a small bowel loop in the right  upper quadrant which today is dilated with fluid up to 4 cm diameter. There is continued intra and extrahepatic biliary ductal dilatation. Pancreatic tail atrophy again noted.  In January of the main portal vein was thrombosed. On today images no portal venous system enhancement is identified.  The stomach is thick walled. Oral contrast was administered, and has reached mid small bowel loops.  Major arterial structures in the abdomen and pelvis appear patent. Aortoiliac calcified atherosclerosis noted. Renal enhancement and contrast excretion is within normal limits.  IMPRESSION: 1. Progression of disease. Progressed and now diffuse portal venous system thrombosis since January. 2. Anasarca. Large volume ascites, mass effect on the liver may reflect subcapsular or loculated ascites. Diffuse moderate to severe bowel wall thickening. Moderate bilateral pleural effusions. 3. Fluid-filled and dilated small bowel loop draining the pancreas.   Electronically Signed   By: Genevie Ann M.D.   On: 11/14/2014 14:42   Mr 3d Recon At Scanner  11/27/2014   CLINICAL DATA:  64 year old female admitted to the hospital into 01/09/2015, with altered mental status, abdominal distention, abdominal pain and poor appetite. History of pancreatic cancer complicated by portal vein thrombosis. History of prior Whipple's procedure.  EXAM:  MRI ABDOMEN WITHOUT CONTRAST  (INCLUDING MRCP)  TECHNIQUE: Multiplanar multisequence MR imaging of the abdomen was performed. Heavily T2-weighted images of the biliary and pancreatic ducts were obtained, and three-dimensional MRCP images were rendered by post processing.  COMPARISON:  CT of the abdomen and pelvis 11/14/2014.  FINDINGS: Lower chest:  Small right pleural effusion.  Hepatobiliary: There is an regular contour of the liver, which appears to be related to mass effect from adjacent partially loculated ascites, as noted on prior CT of the abdomen and pelvis 11/14/2014. No discrete cystic or solid hepatic lesions are identified. MRCP demonstrates moderate intrahepatic biliary ductal dilatation. The common hepatic duct is also moderately dilated, measuring up to 13 mm in diameter shortly before the hepaticojejunostomy. The hepaticojejunostomy appears widely patent. As part of the prior Whipple procedure, gallbladder has been surgically removed.  Pancreas: Status post Whipple procedure. The pancreaticojejunostomy is poorly visualized on today's examination. Pancreatic duct is upper limits of normal measuring 3 mm. No discrete pancreatic lesion is noted. Diffuse pancreatic atrophy in the residual body and tail of the pancreas.  Spleen: Unremarkable.  Adrenals/Urinary Tract: Bilateral adrenal glands and bilateral kidneys are normal in appearance. No hydroureteronephrosis in the visualized portions of the abdomen.  Stomach/Bowel: Postoperative changes of Whipple procedure are again noted. As noted on recent prior examinations, the jejunum proximal to the gastrojejunostomy is dilated measuring up to 4 cm in diameter. At the jejunal stump, in the region of previously noted suture line there is a potential mucosal discontinuity, best appreciated coronal delayed post gadolinium T1 weighted sequences (images 66-70 of series 14), suspicious for a tiny breakdown of the suture line, adjacent which there is some  extraluminal fluid (best appreciated on image 37 of series 4). The stent previously placed at the pancreaticojejunostomy site continues to reside within the dilated proximal jejunum (i.e., stent has migrated from its proper position).  Vascular/Lymphatic: As noted on prior examinations, the distal aspect of the splenic vein, the superior mesenteric vein, splenoportal confluence and portal vein do not opacify with contrast material, compatible with chronic thrombosis. Hepatic veins remain patent at this time. Hepatic perfusion appears to be predominantly from the common hepatic artery, as no significant cavernous transformation is identified in the porta hepatis at this time. No definite lymphadenopathy noted in the  visualized portions of the abdomen.  Other: Moderate to large volume of complex ascites, particularly adjacent to the liver where it appears loculated and is exerting mass effect upon the liver.  Musculoskeletal: No aggressive skeletal lesions identified in the visualized portions of the abdomen.  IMPRESSION: 1. Status post Whipple procedure. The hepaticojejunostomy appears widely patent. Moderate intrahepatic biliary ductal dilatation, with dilatation of the common hepatic duct immediately proximal to the hepaticojejunostomy site measuring up to 13 mm in diameter. This is likely a reflection of persistent proximal jejunal dilatation, which has been present on several prior examinations, rather than an indication of frank biliary obstruction. 2. There are findings concerning for potential suture breakdown at the stump of the proximal aspect of the jejunal limb, with a small amount of extraluminal fluid adjacent to this suture line, as above. This is difficult to evaluate on MRI secondary to intrinsic signal void at sites of suture line, however, loss of mucosal continuity is observed on post-contrast sequences such that the possibility of a suture line dehiscence is of concern. Clinical correlation is  recommended. 3. Moderate to large volume of complex ascites, which appears partially loculated adjacent to the liver exerting mass effect upon the liver. 4. No definite enhancing peritoneal lesions identified at this time to strongly suggest presence of intraperitoneal spread of malignancy. 5. 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 6. Additional findings, as above. These results will be called to the ordering clinician or representative by the Radiologist Assistant, and communication documented in the PACS or zVision Dashboard.   Electronically Signed   By: Vinnie Langton M.D.   On: 11/27/2014 08:41   US Abdomen Limited  11/16/2014   CLINICAL DATA:  Abdominal distention question ascites, history Whipple procedure, hypertension, pancreatic cancer  EXAM: LIMITED ABDOMEN ULTRASOUND FOR ASCITES  TECHNIQUE: Limited ultrasound survey for ascites was performed in all four abdominal quadrants.  COMPARISON:  11/14/2014 CT abdomen and pelvis  FINDINGS: Significant ascites is identified in both lower quadrants.  Smaller amount of ascites is seen in the LEFT upper quadrant, with even less in the perihepatic region.  IMPRESSION: Significant ascites.   Electronically Signed   By: Lavonia Dana M.D.   On: 11/16/2014 17:34   US Paracentesis  11/26/2014   INDICATION: History of pancreatic cancer s/p Whipple, portal HTN, recurrent ascites and request for paracentesis.  EXAM: ULTRASOUND-GUIDED PARACENTESIS  COMPARISON:  11/20/14 paracentesis.  MEDICATIONS: None.  COMPLICATIONS: None immediate  TECHNIQUE: Informed written consent was obtained from the patient after a discussion of the risks, benefits and alternatives to treatment. A timeout was performed prior to the initiation of the procedure.  Initial ultrasound scanning demonstrates a small to moderate amount of ascites within the left lower abdominal quadrant. The left lower abdomen was prepped and draped in the usual sterile fashion. 1%  lidocaine was used for local anesthesia. Under direct ultrasound guidance, a 19 gauge, 7-cm, Yueh catheter was introduced. An ultrasound image was saved for documentation purposed. The paracentesis was performed. The catheter was removed and a dressing was applied. The patient tolerated the procedure well without immediate post procedural complication.  FINDINGS: A total of approximately 2.5 liters of serous fluid was removed.  IMPRESSION: Successful ultrasound-guided paracentesis yielding 2.5 liters of peritoneal fluid.  Read By:  Tsosie Billing PA-C   Electronically Signed   By: Sandi Mariscal M.D.   On: 11/26/2014 11:05   US Paracentesis  11/20/2014   INDICATION: Pancreatic cancer, hepatitis-C, ascites. Request is made  for diagnostic and therapeutic paracentesis.  EXAM: ULTRASOUND-GUIDED DIAGNOSTIC AND THERAPEUTIC PARACENTESIS  COMPARISON:  Prior paracentesis on 11/17/2014  MEDICATIONS: None.  COMPLICATIONS: None immediate  TECHNIQUE: Informed written consent was obtained from the patient after a discussion of the risks, benefits and alternatives to treatment. A timeout was performed prior to the initiation of the procedure.  Initial ultrasound scanning demonstrates a small amount of ascites within the right lower abdominal quadrant. The right lower abdomen was prepped and draped in the usual sterile fashion. 1% lidocaine was used for local anesthesia. Under direct ultrasound guidance, a 19 gauge, 7-cm, Yueh catheter was introduced. An ultrasound image was saved for documentation purposed. The paracentesis was performed. The catheter was removed and a dressing was applied. The patient tolerated the procedure well without immediate post procedural complication.  FINDINGS: A total of approximately 1.7 liters of clear, yellow fluid was removed. Samples were sent to the laboratory as requested by the clinical team.  IMPRESSION: Successful ultrasound-guided diagnostic and therapeutic paracentesis yielding 1.7 liters of  peritoneal fluid.  Read by: Rowe Robert, PA-C   Electronically Signed   By: Jerilynn Mages.  Shick M.D.   On: 11/20/2014 10:53   US Paracentesis  11/17/2014   INDICATION: ascites  CLINICAL DATA:  Request for therapeutic paracentesis  EXAM: ULTRASOUND-GUIDED PARACENTESIS  ULTRASOUND GUIDED RLQ PARACENTESIS  COMPARISON:  None.  MEDICATIONS: 10 cc 1% lidocaine  COMPLICATIONS: None immediate  TECHNIQUE: Informed written consent was obtained from the patient after a discussion of the risks, benefits and alternatives to treatment. A timeout was performed prior to the initiation of the procedure.  Initial ultrasound scanning demonstrates a large amount of ascites within the right lower abdominal quadrant. The right lower abdomen was prepped and draped in the usual sterile fashion. 1% lidocaine with epinephrine was used for local anesthesia. Under direct ultrasound guidance, a 19 gauge, 7-cm, Yueh catheter was introduced. An ultrasound image was saved for documentation purposed. the paracentesis was performed. The catheter was removed and a dressing was applied. The patient tolerated the procedure well without immediate post procedural complication.  FINDINGS: A total of approximately 3 liters of serous fluid was removed.  IMPRESSION: Successful ultrasound-guided paracentesis yielding 3 liters of peritoneal fluid.  PROCEDURE: Ultrasound was performed to localize and mark an adequate pocket of fluid in the right quadrant of the abdomen. The area was then prepped and draped in the normal sterile fashion. 1% Lidocaine was used for local anesthesia. Under ultrasound guidance a 19 gauge Yueh catheter was introduced. Paracentesis was performed. The catheter was removed and a dressing applied.  Read by:  Lavonia Drafts [facility]   Electronically Signed   By: Jacqulynn Cadet M.D.   On: 11/17/2014 11:31   US Paracentesis  11/15/2014   CLINICAL DATA:  Ascites, history of pancreatic cancer. Request therapeutic and diagnostic paracentesis of up  to 3 L max.  EXAM: ULTRASOUND GUIDED PARACENTESIS  COMPARISON:  None.  PROCEDURE: An ultrasound guided paracentesis was thoroughly discussed with the patient and questions answered. The benefits, risks, alternatives and complications were also discussed. The patient understands and wishes to proceed with the procedure. Written consent was obtained.  Ultrasound was performed to localize and mark an adequate pocket of fluid in the left lower quadrant of the abdomen. The area was then prepped and draped in the normal sterile fashion. 1% Lidocaine was used for local anesthesia. Under ultrasound guidance a 19 gauge Yueh catheter was introduced. Paracentesis was performed. The catheter was removed and a dressing  applied.  COMPLICATIONS: None immediate  FINDINGS: A total of approximately 3 L of clear yellow fluid was removed. A fluid sample was sent for laboratory analysis.  IMPRESSION: Successful ultrasound guided paracentesis yielding 3 L of ascites.  Read by: Ascencion Dike PA-C   Electronically Signed   By: Corrie Mckusick D.O.   On: 11/15/2014 12:55   Mr Lambert Mody Cm/mrcp  11/27/2014   CLINICAL DATA:  64 year old female admitted to the hospital into 01/09/2015, with altered mental status, abdominal distention, abdominal pain and poor appetite. History of pancreatic cancer complicated by portal vein thrombosis. History of prior Whipple's procedure.  EXAM: MRI ABDOMEN WITHOUT CONTRAST  (INCLUDING MRCP)  TECHNIQUE: Multiplanar multisequence MR imaging of the abdomen was performed. Heavily T2-weighted images of the biliary and pancreatic ducts were obtained, and three-dimensional MRCP images were rendered by post processing.  COMPARISON:  CT of the abdomen and pelvis 11/14/2014.  FINDINGS: Lower chest:  Small right pleural effusion.  Hepatobiliary: There is an regular contour of the liver, which appears to be related to mass effect from adjacent partially loculated ascites, as noted on prior CT of the abdomen and pelvis  11/14/2014. No discrete cystic or solid hepatic lesions are identified. MRCP demonstrates moderate intrahepatic biliary ductal dilatation. The common hepatic duct is also moderately dilated, measuring up to 13 mm in diameter shortly before the hepaticojejunostomy. The hepaticojejunostomy appears widely patent. As part of the prior Whipple procedure, gallbladder has been surgically removed.  Pancreas: Status post Whipple procedure. The pancreaticojejunostomy is poorly visualized on today's examination. Pancreatic duct is upper limits of normal measuring 3 mm. No discrete pancreatic lesion is noted. Diffuse pancreatic atrophy in the residual body and tail of the pancreas.  Spleen: Unremarkable.  Adrenals/Urinary Tract: Bilateral adrenal glands and bilateral kidneys are normal in appearance. No hydroureteronephrosis in the visualized portions of the abdomen.  Stomach/Bowel: Postoperative changes of Whipple procedure are again noted. As noted on recent prior examinations, the jejunum proximal to the gastrojejunostomy is dilated measuring up to 4 cm in diameter. At the jejunal stump, in the region of previously noted suture line there is a potential mucosal discontinuity, best appreciated coronal delayed post gadolinium T1 weighted sequences (images 66-70 of series 14), suspicious for a tiny breakdown of the suture line, adjacent which there is some extraluminal fluid (best appreciated on image 37 of series 4). The stent previously placed at the pancreaticojejunostomy site continues to reside within the dilated proximal jejunum (i.e., stent has migrated from its proper position).  Vascular/Lymphatic: As noted on prior examinations, the distal aspect of the splenic vein, the superior mesenteric vein, splenoportal confluence and portal vein do not opacify with contrast material, compatible with chronic thrombosis. Hepatic veins remain patent at this time. Hepatic perfusion appears to be predominantly from the common  hepatic artery, as no significant cavernous transformation is identified in the porta hepatis at this time. No definite lymphadenopathy noted in the visualized portions of the abdomen.  Other: Moderate to large volume of complex ascites, particularly adjacent to the liver where it appears loculated and is exerting mass effect upon the liver.  Musculoskeletal: No aggressive skeletal lesions identified in the visualized portions of the abdomen.  IMPRESSION: 1. Status post Whipple procedure. The hepaticojejunostomy appears widely patent. Moderate intrahepatic biliary ductal dilatation, with dilatation of the common hepatic duct immediately proximal to the hepaticojejunostomy site measuring up to 13 mm in diameter. This is likely a reflection of persistent proximal jejunal dilatation, which has been present on several  prior examinations, rather than an indication of frank biliary obstruction. 2. There are findings concerning for potential suture breakdown at the stump of the proximal aspect of the jejunal limb, with a small amount of extraluminal fluid adjacent to this suture line, as above. This is difficult to evaluate on MRI secondary to intrinsic signal void at sites of suture line, however, loss of mucosal continuity is observed on post-contrast sequences such that the possibility of a suture line dehiscence is of concern. Clinical correlation is recommended. 3. Moderate to large volume of complex ascites, which appears partially loculated adjacent to the liver exerting mass effect upon the liver. 4. No definite enhancing peritoneal lesions identified at this time to strongly suggest presence of intraperitoneal spread of malignancy. 5. 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 6. Additional findings, as above. These results will be called to the ordering clinician or representative by the Radiologist Assistant, and communication documented in the PACS or zVision Dashboard.    Electronically Signed   By: Vinnie Langton M.D.   On: 11/27/2014 08:41    CBC  Recent Labs Lab 11/21/14 0530 11/22/14 0505 11/25/14 0820 11/26/14 0753  WBC 4.9 5.0 6.8 8.0  HGB 8.5* 8.8* 8.7* 8.5*  HCT 25.5* 26.9* 26.0* 25.3*  PLT 153 167 163 179  MCV 89.2 90.0 89.7 88.8  MCH 29.7 29.4 30.0 29.8  MCHC 33.3 32.7 33.5 33.6  RDW 23.3* 23.8* 23.9* 23.8*  LYMPHSABS  --  0.8 0.8 0.7  MONOABS  --  0.6 0.7 0.7  EOSABS  --  0.2 0.1 0.0  BASOSABS  --  0.0 0.0 0.0    Chemistries   Recent Labs Lab 11/22/14 0505 11/23/14 0850 11/24/14 0535 11/25/14 0820 11/26/14 0535 11/27/14 0455  NA 136 133* 129* 129* 126* 132*  K 4.4 4.2 3.8 3.2* 2.8* 2.5*  CL 112 109 103 102 99 101  CO2 21 21 21 22 22 24   GLUCOSE 85 73 73 69* 89 80  BUN 5* 6 6 7 7 7   CREATININE 0.44* 0.49* 0.62 0.73 0.75 0.56  CALCIUM 7.8* 7.9* 7.7* 7.7* 7.2* 7.9*  AST 54* 58*  --  57* 54* 40*  ALT 29 32  --  28 25 19   ALKPHOS 541* 517*  --  521* 491* 335*  BILITOT 8.0* 8.5*  --  8.1* 7.8* 8.7*   ------------------------------------------------------------------------------------------------------------------ estimated creatinine clearance is 61.1 mL/min (by C-G formula based on Cr of 0.56). ------------------------------------------------------------------------------------------------------------------ No results for input(s): HGBA1C in the last 72 hours. ------------------------------------------------------------------------------------------------------------------ No results for input(s): CHOL, HDL, LDLCALC, TRIG, CHOLHDL, LDLDIRECT in the last 72 hours. ------------------------------------------------------------------------------------------------------------------ No results for input(s): TSH, T4TOTAL, T3FREE, THYROIDAB in the last 72 hours.  Invalid input(s): FREET3 ------------------------------------------------------------------------------------------------------------------ No results for input(s):  VITAMINB12, FOLATE, FERRITIN, TIBC, IRON, RETICCTPCT in the last 72 hours.  Coagulation profile  Recent Labs Lab 11/25/14 0820  INR 1.44    No results for input(s): DDIMER in the last 72 hours.  Cardiac Enzymes No results for input(s): CKMB, TROPONINI, MYOGLOBIN in the last 168 hours.  Invalid input(s): CK ------------------------------------------------------------------------------------------------------------------ Invalid input(s): POCBNP  Verneita Griffes, MD [facility] [phone]

## 2014-11-27 NOTE — Progress Notes (Signed)
[facility] Progress Note  Subjective:   Returned form radiology-had MRCP--results not yet available. Had paracentesis of 2.5 liters yesterday.  Less abd discomfort today.   Objective:  Vital signs in last 24 hours: Temp:  [98.3 F (36.8 C)-99.8 F (37.7 C)] 98.6 F (37 C) (03/08 0540) Pulse Rate:  [91-114] 91 (03/08 0540) Resp:  [18] 18 (03/08 0540) BP: (102-133)/(56-71) 102/56 mmHg (03/08 0540) SpO2:  [100 %] 100 % (03/08 0540) Weight:  [117 lb 1 oz (53.1 kg)-131 lb (59.421 kg)] 117 lb 1 oz (53.1 kg) (03/08 0656) Last BM Date: 11/22/14 General:   Alert,  Well-developed,    in NAD Heart:  Regular rate and rhythm; no murmurs Pulm;lungs clear Abdomen:  Taut ,mild diffuse tenderness,  Mild distention, hypoactive bowel sounds, without guarding, and without rebound.   Extremities:  With edema bilat Neurologic:  Alert and  oriented x4;  grossly normal neurologically. Psych:  Alert and cooperative. Normal mood and affect.  Intake/Output from previous day: 03/07 0701 - 03/08 0700 In: 480 [P.O.:480] Out: 2250 [Urine:2250] Intake/Output this shift:    Lab Results:  Recent Labs  11/25/14 0820 11/26/14 0753  WBC 6.8 8.0  HGB 8.7* 8.5*  HCT 26.0* 25.3*  PLT 163 179   BMET  Recent Labs  11/25/14 0820 11/26/14 0535 11/27/14 0455  NA 129* 126* 132*  K 3.2* 2.8* 2.5*  CL 102 99 101  CO2 22 22 24   GLUCOSE 69* 89 80  BUN 7 7 7   CREATININE 0.73 0.75 0.56  CALCIUM 7.7* 7.2* 7.9*   LFT  Recent Labs  11/27/14 0455  PROT 6.7  ALBUMIN 2.3*  AST 40*  ALT 19  ALKPHOS 335*  BILITOT 8.7*   PT/INR  Recent Labs  11/25/14 0820  LABPROT 17.7*  INR 1.44     Dg Abd 1 View  11/25/2014   CLINICAL DATA:  Generalized abdominal pain and distention. Pancreatic carcinoma. Previous Whipple procedure.  EXAM: ABDOMEN - 1 VIEW  COMPARISON:  11/16/2014  FINDINGS: Mild generalized gaseous distention of the colon is seen. Moderate amount stool is noted in the  ascending and transverse portions of the colon. No definite evidence of small bowel obstruction. Crowding of bowel loops is seen in the central abdomen suggesting ascites. Surgical clips in stent also seen within the mid abdomen, likely related to previous Whipple procedure.  IMPRESSION: Suspect mild colonic ileus. Moderate colonic stool burden also noted. Probable ascites.   Electronically Signed   By: Earle Gell M.D.   On: 11/25/2014 10:55   US Paracentesis  11/26/2014   INDICATION: History of pancreatic cancer s/p Whipple, portal HTN, recurrent ascites and request for paracentesis.  EXAM: ULTRASOUND-GUIDED PARACENTESIS  COMPARISON:  11/20/14 paracentesis.  MEDICATIONS: None.  COMPLICATIONS: None immediate  TECHNIQUE: Informed written consent was obtained from the patient after a discussion of the risks, benefits and alternatives to treatment. A timeout was performed prior to the initiation of the procedure.  Initial ultrasound scanning demonstrates a small to moderate amount of ascites within the left lower abdominal quadrant. The left lower abdomen was prepped and draped in the usual sterile fashion. 1% lidocaine was used for local anesthesia. Under direct ultrasound guidance, a 19 gauge, 7-cm, Yueh catheter was introduced. An ultrasound image was saved for documentation purposed. The paracentesis was performed. The catheter was removed and a dressing was applied. The patient tolerated the procedure well without immediate post procedural complication.  FINDINGS: A total of approximately 2.5 liters of  serous fluid was removed.  IMPRESSION: Successful ultrasound-guided paracentesis yielding 2.5 liters of peritoneal fluid.  Read By:  Tsosie Billing PA-C   Electronically Signed   By: Sandi Mariscal M.D.   On: 11/26/2014 11:05    ASSESSMENT/PLAN:   64 year old female with pancreatic cancer, s/p whipple Nov 2015. Post-op course complicated by recurrent hospitalizations for bacteremia, PV thrombosis and general  decline. Now with ascites, likely secondary to portal HTN but malignancy not excluded. Cytology reveals atypical cells. Had MRCP this am to better evaluate biliary anatomy prior to decisions regarding any intervention for treatment of ascites and possibly biliary obstruction.Results pending.   Diuretics currently held due to hyponatremia and hypokalemia.   LOS: 13 days   Tevis Dunavan, Deloris Ping 11/27/2014, Pager [phone]  Addendum- reviewed pt with Dr Hilarie Fredrickson. Chance of reversing clot is poor, and portal HTN argues against clot being acute. Concentration is on treating portal HTN--? With [place] shunt. Will ask IR to weigh in on this. Will also ask palliative care to reassess.

## 2014-11-27 NOTE — Progress Notes (Signed)
Paged MD Samtani regarding the results of MRI/MRCP are back Neta Mends RN 9:27 AM 11-27-2014

## 2014-11-27 NOTE — Progress Notes (Signed)
Paged MD Samtani regarding patient's critical lab value for potassium of 2.5, awaitng callback Neta Mends RN 7:28 AM 11-27-2014

## 2014-11-27 NOTE — Progress Notes (Signed)
Patient ID: Melinda Conrad, female   DOB: 11-20-1950, 64 y.o.   MRN: [id number]    Subjective: Pt still doing poorly.  Got MRI yesterday for concerns that she may have biliary obstruction.    Objective: Vital signs in last 24 hours: Temp:  [98.3 F (36.8 C)-99.8 F (37.7 C)] 98.6 F (37 C) (03/08 0540) Pulse Rate:  [91-114] 91 (03/08 0540) Resp:  [18] 18 (03/08 0540) BP: (102-133)/(56-71) 102/56 mmHg (03/08 0540) SpO2:  [100 %] 100 % (03/08 0540) Weight:  [117 lb 1 oz (53.1 kg)-131 lb (59.421 kg)] 117 lb 1 oz (53.1 kg) (03/08 0656) Last BM Date: 11/22/14  Intake/Output from previous day: 03/07 0701 - 03/08 0700 In: 480 [P.O.:480] Out: 2250 [Urine:2250] Intake/Output this shift:    General appearance: alert, cooperative, cachectic and mild distress Chest wall: breathing comfortably GI: soft, mildly distended, non tender Decreased ascites  Lab Results:   Recent Labs  11/25/14 0820 11/26/14 0753  WBC 6.8 8.0  HGB 8.7* 8.5*  HCT 26.0* 25.3*  PLT 163 179   BMET  Recent Labs  11/26/14 0535 11/27/14 0455  NA 126* 132*  K 2.8* 2.5*  CL 99 101  CO2 22 24  GLUCOSE 89 80  BUN 7 7  CREATININE 0.75 0.56  CALCIUM 7.2* 7.9*   PT/INR  Recent Labs  11/25/14 0820  LABPROT 17.7*  INR 1.44   ABG No results for input(s): PHART, HCO3 in the last 72 hours.  Invalid input(s): PCO2, PO2  Studies/Results: Mr 3d Recon At Scanner  11/27/2014   CLINICAL DATA:  64 year old female admitted to the hospital into 01/09/2015, with altered mental status, abdominal distention, abdominal pain and poor appetite. History of pancreatic cancer complicated by portal vein thrombosis. History of prior Whipple's procedure.  EXAM: MRI ABDOMEN WITHOUT CONTRAST  (INCLUDING MRCP)  TECHNIQUE: Multiplanar multisequence MR imaging of the abdomen was performed. Heavily T2-weighted images of the biliary and pancreatic ducts were obtained, and three-dimensional MRCP images were rendered by post  processing.  COMPARISON:  CT of the abdomen and pelvis 11/14/2014.  FINDINGS: Lower chest:  Small right pleural effusion.  Hepatobiliary: There is an regular contour of the liver, which appears to be related to mass effect from adjacent partially loculated ascites, as noted on prior CT of the abdomen and pelvis 11/14/2014. No discrete cystic or solid hepatic lesions are identified. MRCP demonstrates moderate intrahepatic biliary ductal dilatation. The common hepatic duct is also moderately dilated, measuring up to 13 mm in diameter shortly before the hepaticojejunostomy. The hepaticojejunostomy appears widely patent. As part of the prior Whipple procedure, gallbladder has been surgically removed.  Pancreas: Status post Whipple procedure. The pancreaticojejunostomy is poorly visualized on today's examination. Pancreatic duct is upper limits of normal measuring 3 mm. No discrete pancreatic lesion is noted. Diffuse pancreatic atrophy in the residual body and tail of the pancreas.  Spleen: Unremarkable.  Adrenals/Urinary Tract: Bilateral adrenal glands and bilateral kidneys are normal in appearance. No hydroureteronephrosis in the visualized portions of the abdomen.  Stomach/Bowel: Postoperative changes of Whipple procedure are again noted. As noted on recent prior examinations, the jejunum proximal to the gastrojejunostomy is dilated measuring up to 4 cm in diameter. At the jejunal stump, in the region of previously noted suture line there is a potential mucosal discontinuity, best appreciated coronal delayed post gadolinium T1 weighted sequences (images 66-70 of series 14), suspicious for a tiny breakdown of the suture line, adjacent which there is some extraluminal fluid (best appreciated on  image 37 of series 4). The stent previously placed at the pancreaticojejunostomy site continues to reside within the dilated proximal jejunum (i.e., stent has migrated from its proper position).  Vascular/Lymphatic: As noted on  prior examinations, the distal aspect of the splenic vein, the superior mesenteric vein, splenoportal confluence and portal vein do not opacify with contrast material, compatible with chronic thrombosis. Hepatic veins remain patent at this time. Hepatic perfusion appears to be predominantly from the common hepatic artery, as no significant cavernous transformation is identified in the porta hepatis at this time. No definite lymphadenopathy noted in the visualized portions of the abdomen.  Other: Moderate to large volume of complex ascites, particularly adjacent to the liver where it appears loculated and is exerting mass effect upon the liver.  Musculoskeletal: No aggressive skeletal lesions identified in the visualized portions of the abdomen.  IMPRESSION: 1. Status post Whipple procedure. The hepaticojejunostomy appears widely patent. Moderate intrahepatic biliary ductal dilatation, with dilatation of the common hepatic duct immediately proximal to the hepaticojejunostomy site measuring up to 13 mm in diameter. This is likely a reflection of persistent proximal jejunal dilatation, which has been present on several prior examinations, rather than an indication of frank biliary obstruction. 2. There are findings concerning for potential suture breakdown at the stump of the proximal aspect of the jejunal limb, with a small amount of extraluminal fluid adjacent to this suture line, as above. This is difficult to evaluate on MRI secondary to intrinsic signal void at sites of suture line, however, loss of mucosal continuity is observed on post-contrast sequences such that the possibility of a suture line dehiscence is of concern. Clinical correlation is recommended. 3. Moderate to large volume of complex ascites, which appears partially loculated adjacent to the liver exerting mass effect upon the liver. 4. No definite enhancing peritoneal lesions identified at this time to strongly suggest presence of intraperitoneal  spread of malignancy. 5. 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 6. Additional findings, as above. These results will be called to the ordering clinician or representative by the Radiologist Assistant, and communication documented in the PACS or zVision Dashboard.   Electronically Signed   By: Vinnie Langton M.D.   On: 11/27/2014 08:41   US Paracentesis  11/26/2014   INDICATION: History of pancreatic cancer s/p Whipple, portal HTN, recurrent ascites and request for paracentesis.  EXAM: ULTRASOUND-GUIDED PARACENTESIS  COMPARISON:  11/20/14 paracentesis.  MEDICATIONS: None.  COMPLICATIONS: None immediate  TECHNIQUE: Informed written consent was obtained from the patient after a discussion of the risks, benefits and alternatives to treatment. A timeout was performed prior to the initiation of the procedure.  Initial ultrasound scanning demonstrates a small to moderate amount of ascites within the left lower abdominal quadrant. The left lower abdomen was prepped and draped in the usual sterile fashion. 1% lidocaine was used for local anesthesia. Under direct ultrasound guidance, a 19 gauge, 7-cm, Yueh catheter was introduced. An ultrasound image was saved for documentation purposed. The paracentesis was performed. The catheter was removed and a dressing was applied. The patient tolerated the procedure well without immediate post procedural complication.  FINDINGS: A total of approximately 2.5 liters of serous fluid was removed.  IMPRESSION: Successful ultrasound-guided paracentesis yielding 2.5 liters of peritoneal fluid.  Read By:  Tsosie Billing PA-C   Electronically Signed   By: Sandi Mariscal M.D.   On: 11/26/2014 11:05   Mr Lambert Mody Cm/mrcp  11/27/2014   CLINICAL DATA:  64 year old female admitted  to the hospital into 01/09/2015, with altered mental status, abdominal distention, abdominal pain and poor appetite. History of pancreatic cancer complicated by portal vein thrombosis.  History of prior Whipple's procedure.  EXAM: MRI ABDOMEN WITHOUT CONTRAST  (INCLUDING MRCP)  TECHNIQUE: Multiplanar multisequence MR imaging of the abdomen was performed. Heavily T2-weighted images of the biliary and pancreatic ducts were obtained, and three-dimensional MRCP images were rendered by post processing.  COMPARISON:  CT of the abdomen and pelvis 11/14/2014.  FINDINGS: Lower chest:  Small right pleural effusion.  Hepatobiliary: There is an regular contour of the liver, which appears to be related to mass effect from adjacent partially loculated ascites, as noted on prior CT of the abdomen and pelvis 11/14/2014. No discrete cystic or solid hepatic lesions are identified. MRCP demonstrates moderate intrahepatic biliary ductal dilatation. The common hepatic duct is also moderately dilated, measuring up to 13 mm in diameter shortly before the hepaticojejunostomy. The hepaticojejunostomy appears widely patent. As part of the prior Whipple procedure, gallbladder has been surgically removed.  Pancreas: Status post Whipple procedure. The pancreaticojejunostomy is poorly visualized on today's examination. Pancreatic duct is upper limits of normal measuring 3 mm. No discrete pancreatic lesion is noted. Diffuse pancreatic atrophy in the residual body and tail of the pancreas.  Spleen: Unremarkable.  Adrenals/Urinary Tract: Bilateral adrenal glands and bilateral kidneys are normal in appearance. No hydroureteronephrosis in the visualized portions of the abdomen.  Stomach/Bowel: Postoperative changes of Whipple procedure are again noted. As noted on recent prior examinations, the jejunum proximal to the gastrojejunostomy is dilated measuring up to 4 cm in diameter. At the jejunal stump, in the region of previously noted suture line there is a potential mucosal discontinuity, best appreciated coronal delayed post gadolinium T1 weighted sequences (images 66-70 of series 14), suspicious for a tiny breakdown of the suture  line, adjacent which there is some extraluminal fluid (best appreciated on image 37 of series 4). The stent previously placed at the pancreaticojejunostomy site continues to reside within the dilated proximal jejunum (i.e., stent has migrated from its proper position).  Vascular/Lymphatic: As noted on prior examinations, the distal aspect of the splenic vein, the superior mesenteric vein, splenoportal confluence and portal vein do not opacify with contrast material, compatible with chronic thrombosis. Hepatic veins remain patent at this time. Hepatic perfusion appears to be predominantly from the common hepatic artery, as no significant cavernous transformation is identified in the porta hepatis at this time. No definite lymphadenopathy noted in the visualized portions of the abdomen.  Other: Moderate to large volume of complex ascites, particularly adjacent to the liver where it appears loculated and is exerting mass effect upon the liver.  Musculoskeletal: No aggressive skeletal lesions identified in the visualized portions of the abdomen.  IMPRESSION: 1. Status post Whipple procedure. The hepaticojejunostomy appears widely patent. Moderate intrahepatic biliary ductal dilatation, with dilatation of the common hepatic duct immediately proximal to the hepaticojejunostomy site measuring up to 13 mm in diameter. This is likely a reflection of persistent proximal jejunal dilatation, which has been present on several prior examinations, rather than an indication of frank biliary obstruction. 2. There are findings concerning for potential suture breakdown at the stump of the proximal aspect of the jejunal limb, with a small amount of extraluminal fluid adjacent to this suture line, as above. This is difficult to evaluate on MRI secondary to intrinsic signal void at sites of suture line, however, loss of mucosal continuity is observed on post-contrast sequences such that the possibility of  a suture line dehiscence is of  concern. Clinical correlation is recommended. 3. Moderate to large volume of complex ascites, which appears partially loculated adjacent to the liver exerting mass effect upon the liver. 4. No definite enhancing peritoneal lesions identified at this time to strongly suggest presence of intraperitoneal spread of malignancy. 5. 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 6. Additional findings, as above. These results will be called to the ordering clinician or representative by the Radiologist Assistant, and communication documented in the PACS or zVision Dashboard.   Electronically Signed   By: Vinnie Langton M.D.   On: 11/27/2014 08:41    Anti-infectives: Anti-infectives    Start     Dose/Rate Route Frequency Ordered Stop   11/22/14 2200  amoxicillin-clavulanate (AUGMENTIN) 875-125 MG per tablet 1 tablet  Status:  Discontinued     1 tablet Oral Every 12 hours 11/22/14 1859 11/25/14 1800   11/15/14 0000  Ampicillin-Sulbactam (UNASYN) 3 g in sodium chloride 0.9 % 100 mL IVPB  Status:  Discontinued     3 g 100 mL/hr over 60 Minutes Intravenous 4 times per day 11/14/14 1616 11/22/14 1859   11/14/14 1615  Ampicillin-Sulbactam (UNASYN) 3 g in sodium chloride 0.9 % 100 mL IVPB     3 g 100 mL/hr over 60 Minutes Intravenous STAT 11/14/14 1609 11/14/14 1827   11/14/14 1600  ampicillin-sulbactam (UNASYN) 1.5 g in sodium chloride 0.9 % 50 mL IVPB  Status:  Discontinued     1.5 g 100 mL/hr over 30 Minutes Intravenous  Once 11/14/14 1527 11/14/14 1609      Assessment/Plan: s/p * No surgery found * pancreatic cancer, s/p whipple  Remains malnourished with ascites On diuretics. No evidence of biliary obstruction.   Consider attempt at anticoagulation again if GI feels that portal vein thrombus is cause of acute liver disease.  May be prudent to repeat endo prior to this.   If GI does not think that would be helpful, do not think worth risk.     LOS: 13 days     Dontavia Brand 11/27/2014

## 2014-11-28 DIAGNOSIS — K7290 Hepatic failure, unspecified without coma: Secondary | ICD-10-CM | POA: Insufficient documentation

## 2014-11-28 DIAGNOSIS — K851 Biliary acute pancreatitis without necrosis or infection: Secondary | ICD-10-CM | POA: Insufficient documentation

## 2014-11-28 LAB — CBC WITH DIFFERENTIAL/PLATELET
BASOS ABS: 0 10*3/uL (ref 0.0–0.1)
BASOS PCT: 0 % (ref 0–1)
Eosinophils Absolute: 0.1 10*3/uL (ref 0.0–0.7)
Eosinophils Relative: 1 % (ref 0–5)
HCT: 24.4 % — ABNORMAL LOW (ref 36.0–46.0)
Hemoglobin: 8.1 g/dL — ABNORMAL LOW (ref 12.0–15.0)
LYMPHS ABS: 0.5 10*3/uL — AB (ref 0.7–4.0)
Lymphocytes Relative: 8 % — ABNORMAL LOW (ref 12–46)
MCH: 29.9 pg (ref 26.0–34.0)
MCHC: 33.2 g/dL (ref 30.0–36.0)
MCV: 90 fL (ref 78.0–100.0)
Monocytes Absolute: 0.6 10*3/uL (ref 0.1–1.0)
Monocytes Relative: 9 % (ref 3–12)
NEUTROS ABS: 5 10*3/uL (ref 1.7–7.7)
Neutrophils Relative %: 82 % — ABNORMAL HIGH (ref 43–77)
Platelets: 151 10*3/uL (ref 150–400)
RBC: 2.71 MIL/uL — AB (ref 3.87–5.11)
RDW: 23.3 % — ABNORMAL HIGH (ref 11.5–15.5)
WBC: 6.2 10*3/uL (ref 4.0–10.5)

## 2014-11-28 LAB — COMPREHENSIVE METABOLIC PANEL
ALK PHOS: 363 U/L — AB (ref 39–117)
ALT: 20 U/L (ref 0–35)
ANION GAP: 8 (ref 5–15)
AST: 44 U/L — ABNORMAL HIGH (ref 0–37)
Albumin: 2 g/dL — ABNORMAL LOW (ref 3.5–5.2)
BUN: 6 mg/dL (ref 6–23)
CALCIUM: 7.7 mg/dL — AB (ref 8.4–10.5)
CO2: 23 mmol/L (ref 19–32)
Chloride: 98 mmol/L (ref 96–112)
Creatinine, Ser: 0.47 mg/dL — ABNORMAL LOW (ref 0.50–1.10)
GFR calc Af Amer: >90 mL/min (ref >90)
Glucose, Bld: 84 mg/dL (ref 70–99)
Potassium: 3.1 mmol/L — ABNORMAL LOW (ref 3.5–5.1)
Sodium: 129 mmol/L — ABNORMAL LOW (ref 135–145)
Total Bilirubin: 9.5 mg/dL — ABNORMAL HIGH (ref 0.3–1.2)
Total Protein: 6.8 g/dL (ref 6.0–8.3)

## 2014-11-28 LAB — PROTIME-INR
INR: 1.38 (ref 0.00–1.49)
Prothrombin Time: 17.1 seconds — ABNORMAL HIGH (ref 11.6–15.2)

## 2014-11-28 MED ORDER — POTASSIUM CHLORIDE CRYS ER 20 MEQ PO TBCR: 40.0000 meq | EXTENDED_RELEASE_TABLET | Freq: Once | ORAL | Status: DC

## 2014-11-28 NOTE — Progress Notes (Signed)
[facility] Progress Note  Subjective:   Bili continues to climb.  Sodium remains low.Patient has had paracentesis twice. Patient continues to develop fluid.   Objective:  Vital signs in last 24 hours: Temp:  [98 F (36.7 C)-98.2 F (36.8 C)] 98.1 F (36.7 C) (03/09 0553) Pulse Rate:  [77-86] 86 (03/09 0553) Resp:  [18] 18 (03/09 0553) BP: (88-108)/(54-64) 108/60 mmHg (03/09 0553) SpO2:  [100 %] 100 % (03/09 0553) Weight:  [117 lb 11.6 oz (53.4 kg)] 117 lb 11.6 oz (53.4 kg) (03/09 0242) Last BM Date: 11/22/14 General:   Alert, ill appearing female in NAD Heart:  Regular rate and rhythm; no murmurs Pulm;lungs clear Abdomen:  Tight, distended, mild diffuse tenderness  Extremities:  bilat edema Neurologic:  Alert and  oriented x4;  grossly normal neurologically. Psych:  Alert and cooperative. Normal mood and affect.  Intake/Output from previous day: 03/08 0701 - 03/09 0700 In: 480 [P.O.:480] Out: 2500 [Urine:2500] Intake/Output this shift:    Lab Results:  Recent Labs  11/25/14 0820 11/26/14 0753 11/28/14 0545  WBC 6.8 8.0 6.2  HGB 8.7* 8.5* 8.1*  HCT 26.0* 25.3* 24.4*  PLT 163 179 151   BMET  Recent Labs  11/26/14 0535 11/27/14 0455 11/28/14 0545  NA 126* 132* 129*  K 2.8* 2.5* 3.1*  CL 99 101 98  CO2 22 24 23   GLUCOSE 89 80 84  BUN 7 7 6   CREATININE 0.75 0.56 0.47*  CALCIUM 7.2* 7.9* 7.7*   LFT  Recent Labs  11/28/14 0545  PROT 6.8  ALBUMIN 2.0*  AST 44*  ALT 20  ALKPHOS 363*  BILITOT 9.5*   PT/INR  Recent Labs  11/25/14 0820 11/28/14 0545  LABPROT 17.7* 17.1*  INR 1.44 1.38   Hepatitis Panel No results for input(s): HEPBSAG, HCVAB, HEPAIGM, HEPBIGM in the last 72 hours.  Mr 3d Recon At Scanner  11/27/2014   CLINICAL DATA:  64 year old female admitted to the hospital into 01/09/2015, with altered mental status, abdominal distention, abdominal pain and poor appetite. History of pancreatic cancer complicated by  portal vein thrombosis. History of prior Whipple's procedure.  EXAM: MRI ABDOMEN WITHOUT CONTRAST  (INCLUDING MRCP)  TECHNIQUE: Multiplanar multisequence MR imaging of the abdomen was performed. Heavily T2-weighted images of the biliary and pancreatic ducts were obtained, and three-dimensional MRCP images were rendered by post processing.  COMPARISON:  CT of the abdomen and pelvis 11/14/2014.  FINDINGS: Lower chest:  Small right pleural effusion.  Hepatobiliary: There is an regular contour of the liver, which appears to be related to mass effect from adjacent partially loculated ascites, as noted on prior CT of the abdomen and pelvis 11/14/2014. No discrete cystic or solid hepatic lesions are identified. MRCP demonstrates moderate intrahepatic biliary ductal dilatation. The common hepatic duct is also moderately dilated, measuring up to 13 mm in diameter shortly before the hepaticojejunostomy. The hepaticojejunostomy appears widely patent. As part of the prior Whipple procedure, gallbladder has been surgically removed.  Pancreas: Status post Whipple procedure. The pancreaticojejunostomy is poorly visualized on today's examination. Pancreatic duct is upper limits of normal measuring 3 mm. No discrete pancreatic lesion is noted. Diffuse pancreatic atrophy in the residual body and tail of the pancreas.  Spleen: Unremarkable.  Adrenals/Urinary Tract: Bilateral adrenal glands and bilateral kidneys are normal in appearance. No hydroureteronephrosis in the visualized portions of the abdomen.  Stomach/Bowel: Postoperative changes of Whipple procedure are again noted. As noted on recent prior examinations, the jejunum proximal to  the gastrojejunostomy is dilated measuring up to 4 cm in diameter. At the jejunal stump, in the region of previously noted suture line there is a potential mucosal discontinuity, best appreciated coronal delayed post gadolinium T1 weighted sequences (images 66-70 of series 14), suspicious for a tiny  breakdown of the suture line, adjacent which there is some extraluminal fluid (best appreciated on image 37 of series 4). The stent previously placed at the pancreaticojejunostomy site continues to reside within the dilated proximal jejunum (i.e., stent has migrated from its proper position).  Vascular/Lymphatic: As noted on prior examinations, the distal aspect of the splenic vein, the superior mesenteric vein, splenoportal confluence and portal vein do not opacify with contrast material, compatible with chronic thrombosis. Hepatic veins remain patent at this time. Hepatic perfusion appears to be predominantly from the common hepatic artery, as no significant cavernous transformation is identified in the porta hepatis at this time. No definite lymphadenopathy noted in the visualized portions of the abdomen.  Other: Moderate to large volume of complex ascites, particularly adjacent to the liver where it appears loculated and is exerting mass effect upon the liver.  Musculoskeletal: No aggressive skeletal lesions identified in the visualized portions of the abdomen.  IMPRESSION: 1. Status post Whipple procedure. The hepaticojejunostomy appears widely patent. Moderate intrahepatic biliary ductal dilatation, with dilatation of the common hepatic duct immediately proximal to the hepaticojejunostomy site measuring up to 13 mm in diameter. This is likely a reflection of persistent proximal jejunal dilatation, which has been present on several prior examinations, rather than an indication of frank biliary obstruction. 2. There are findings concerning for potential suture breakdown at the stump of the proximal aspect of the jejunal limb, with a small amount of extraluminal fluid adjacent to this suture line, as above. This is difficult to evaluate on MRI secondary to intrinsic signal void at sites of suture line, however, loss of mucosal continuity is observed on post-contrast sequences such that the possibility of a suture  line dehiscence is of concern. Clinical correlation is recommended. 3. Moderate to large volume of complex ascites, which appears partially loculated adjacent to the liver exerting mass effect upon the liver. 4. No definite enhancing peritoneal lesions identified at this time to strongly suggest presence of intraperitoneal spread of malignancy. 5. 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 6. Additional findings, as above. These results will be called to the ordering clinician or representative by the Radiologist Assistant, and communication documented in the PACS or zVision Dashboard.   Electronically Signed   By: Vinnie Langton M.D.   On: 11/27/2014 08:41   US Paracentesis  11/26/2014   INDICATION: History of pancreatic cancer s/p Whipple, portal HTN, recurrent ascites and request for paracentesis.  EXAM: ULTRASOUND-GUIDED PARACENTESIS  COMPARISON:  11/20/14 paracentesis.  MEDICATIONS: None.  COMPLICATIONS: None immediate  TECHNIQUE: Informed written consent was obtained from the patient after a discussion of the risks, benefits and alternatives to treatment. A timeout was performed prior to the initiation of the procedure.  Initial ultrasound scanning demonstrates a small to moderate amount of ascites within the left lower abdominal quadrant. The left lower abdomen was prepped and draped in the usual sterile fashion. 1% lidocaine was used for local anesthesia. Under direct ultrasound guidance, a 19 gauge, 7-cm, Yueh catheter was introduced. An ultrasound image was saved for documentation purposed. The paracentesis was performed. The catheter was removed and a dressing was applied. The patient tolerated the procedure well without immediate post procedural complication.  FINDINGS: A total of approximately 2.5 liters of serous fluid was removed.  IMPRESSION: Successful ultrasound-guided paracentesis yielding 2.5 liters of peritoneal fluid.  Read By:  Tsosie Billing PA-C    Electronically Signed   By: Sandi Mariscal M.D.   On: 11/26/2014 11:05   Mr Lambert Mody Cm/mrcp  11/27/2014   CLINICAL DATA:  64 year old female admitted to the hospital into 01/09/2015, with altered mental status, abdominal distention, abdominal pain and poor appetite. History of pancreatic cancer complicated by portal vein thrombosis. History of prior Whipple's procedure.  EXAM: MRI ABDOMEN WITHOUT CONTRAST  (INCLUDING MRCP)  TECHNIQUE: Multiplanar multisequence MR imaging of the abdomen was performed. Heavily T2-weighted images of the biliary and pancreatic ducts were obtained, and three-dimensional MRCP images were rendered by post processing.  COMPARISON:  CT of the abdomen and pelvis 11/14/2014.  FINDINGS: Lower chest:  Small right pleural effusion.  Hepatobiliary: There is an regular contour of the liver, which appears to be related to mass effect from adjacent partially loculated ascites, as noted on prior CT of the abdomen and pelvis 11/14/2014. No discrete cystic or solid hepatic lesions are identified. MRCP demonstrates moderate intrahepatic biliary ductal dilatation. The common hepatic duct is also moderately dilated, measuring up to 13 mm in diameter shortly before the hepaticojejunostomy. The hepaticojejunostomy appears widely patent. As part of the prior Whipple procedure, gallbladder has been surgically removed.  Pancreas: Status post Whipple procedure. The pancreaticojejunostomy is poorly visualized on today's examination. Pancreatic duct is upper limits of normal measuring 3 mm. No discrete pancreatic lesion is noted. Diffuse pancreatic atrophy in the residual body and tail of the pancreas.  Spleen: Unremarkable.  Adrenals/Urinary Tract: Bilateral adrenal glands and bilateral kidneys are normal in appearance. No hydroureteronephrosis in the visualized portions of the abdomen.  Stomach/Bowel: Postoperative changes of Whipple procedure are again noted. As noted on recent prior examinations, the jejunum  proximal to the gastrojejunostomy is dilated measuring up to 4 cm in diameter. At the jejunal stump, in the region of previously noted suture line there is a potential mucosal discontinuity, best appreciated coronal delayed post gadolinium T1 weighted sequences (images 66-70 of series 14), suspicious for a tiny breakdown of the suture line, adjacent which there is some extraluminal fluid (best appreciated on image 37 of series 4). The stent previously placed at the pancreaticojejunostomy site continues to reside within the dilated proximal jejunum (i.e., stent has migrated from its proper position).  Vascular/Lymphatic: As noted on prior examinations, the distal aspect of the splenic vein, the superior mesenteric vein, splenoportal confluence and portal vein do not opacify with contrast material, compatible with chronic thrombosis. Hepatic veins remain patent at this time. Hepatic perfusion appears to be predominantly from the common hepatic artery, as no significant cavernous transformation is identified in the porta hepatis at this time. No definite lymphadenopathy noted in the visualized portions of the abdomen.  Other: Moderate to large volume of complex ascites, particularly adjacent to the liver where it appears loculated and is exerting mass effect upon the liver.  Musculoskeletal: No aggressive skeletal lesions identified in the visualized portions of the abdomen.  IMPRESSION: 1. Status post Whipple procedure. The hepaticojejunostomy appears widely patent. Moderate intrahepatic biliary ductal dilatation, with dilatation of the common hepatic duct immediately proximal to the hepaticojejunostomy site measuring up to 13 mm in diameter. This is likely a reflection of persistent proximal jejunal dilatation, which has been present on several prior examinations, rather than an indication of frank biliary obstruction. 2. There are findings  concerning for potential suture breakdown at the stump of the proximal aspect  of the jejunal limb, with a small amount of extraluminal fluid adjacent to this suture line, as above. This is difficult to evaluate on MRI secondary to intrinsic signal void at sites of suture line, however, loss of mucosal continuity is observed on post-contrast sequences such that the possibility of a suture line dehiscence is of concern. Clinical correlation is recommended. 3. Moderate to large volume of complex ascites, which appears partially loculated adjacent to the liver exerting mass effect upon the liver. 4. No definite enhancing peritoneal lesions identified at this time to strongly suggest presence of intraperitoneal spread of malignancy. 5. 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 6. Additional findings, as above. These results will be called to the ordering clinician or representative by the Radiologist Assistant, and communication documented in the PACS or zVision Dashboard.   Electronically Signed   By: Vinnie Langton M.D.   On: 11/27/2014 08:41    ASSESSMENT/PLAN:   64 year old female with pancreatic cancer, s/p whipple Nov 2015. Post-op course complicated by recurrent hospitalizations for bacteremia, PV thrombosis and general decline. Now with ascites, likely secondary to portal HTN but malignancy not excluded. Cytology reveals atypical cells. MRCP with chronic portal vein thrombosis. Ascites likely due to her portal HTN.  IR to eval pt for [facility] shunt vs indwelling catheter. Med service will also contact palliative care (per note of lat pm).     LOS: 14 days   Chade Pitner, Vita Barley PA-C 11/28/2014, Pager [phone]

## 2014-11-28 NOTE — Progress Notes (Signed)
Patient ID: Melinda Conrad  female  NAT:[id number]    DOB: [birth year]    DOA: 11/14/2014  PCP: Kevan Ny, MD   Brief history of present illness Melinda Conrad is a 64 y.o. female with history of adenocarcinoma of head of pancreas, status post Whipple's procedure 08/07/14, completed concurrent capecitabine and radiation, poor performance status and hence did not get adjuvant chemotherapy, recurrent Klebsiella bacteremia- attributed to intra-abdominal source/presumed septic portal vein thrombus, acute portal vein thrombosis-not on anticoagulation secondary to GI bleed, tobacco abuse & hepatitis C presented to the [facility] ED on 11/14/14 with complaints of progressive mental status changes, abdominal distention/pain and poor appetite. Patient was unable to provide history due to altered mental status and no family member was at the bedside. Apparently patient was having progressive failure to thrive since Whipple's procedure. However over the last 3 days prior to admission , family have noticed gradually worsening confusion, disorientation and lethargic to a point of near unresponsiveness. She had decreased oral intake of liquids and food but no nausea or vomiting, progressive abdominal and lower extremity swelling with intermittent abdominal pain and no fevers. Patient has been getting IV antibiotics through right upper extremity PICC line and therefore plans to remove PICC line and from IV Unasyn to oral cefuroxime. In the ED, patient wassomnolent but arousable, afebrile, elevated blood pressure, lab work significant for potassium <2, abnormal LFTs, ammonia 120, hemoglobin 8.5, CT head without acute findings, CT abdomen and pelvis: Diffuse portal vein system thrombosis, anasarca, diffuse moderate to severe bowel wall thickening, moderate bilateral pleural effusions and fluid filled and dilated small bowel loop draining the pancreas. Since admission Patient has had paracentesis twice. Patient  continued to develop fluid and requires further tapping. gastroenterology, surgery and interventional radiology closely followed the patient through the hospitalization.    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 - Gastroenterology, interventional radiology, surgery consulted - Patient has significant ascites and required multiple paracenteses during this hospitalization. Cytology showed atypical cells. MRCP showed a portal vein thrombosis. Per gastroenterology, Dr. Hilarie Fredrickson, it is very unlikely that" therapy would lead to any meaningful recannulization of these abdominal vessels and carries a high risk of bleeding with known anastomosis status post Whipple procedure. Recommended palliative medicine consult for goals of care, overall poor prognosis -  discussed in detail with palliative medicine today, Melinda Conrad, who had discussed detailed discussion with the patient and the daughter, patient at this time does not want a tunneled catheter for ascites but is open to palliative/therapeutic paracentesis in future.  - ordered ultrasound-guided paracentesis in a.m.   - Hospice consult placed    Anasarca/ascites -Secondary to liver disease, portal vein thrombosis and hypoalbuminemia with malnutrition -Patient had ultrasound-guided paracentesis x2, on 2/25, 2/27   -Patient was empirically started on IV Unasyn 2/24 which was tapered on 3/3 2 Augmentin to cover for possible SBP, completed course - Fluid cytology shows atypical cells--+ 4 Caklretinin and MT-1 consistent with mesothelial cells, cultures negative to date - Palliative care consulted and appreciated, overall poor prognosis, therapeutic paracentesis in a.m. patient has declined tunneled  catheter.    Previous Klebsiella and streptococcal bacteremia -ID consulted and appreciated, completed 42 day course of antibiotics  Severe hypokalemia - Placed on daily supplementation  Dehydration -Secondary to poor oral intake  -  currently on no diuretics, sodium level trending down with Lasix or Aldactone.   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 Barry Dienes  ?Small bowel ileus Eating poorly but tolerating diet  DVT Prophylaxis:  Code Status: DO NOT RESUSCITATE   Family Communication: Discussed in detail with the patient, all imaging results, lab results explained to the patient   Disposition: likely home hospice in am  Consultants:  GI  Surgery  IR  Palliative medicine  Procedures:  Paracentesis  Antibiotics:  None    Subjective: Patient seen and examined earlier this morning around 8 AM, pain under control, abdomen tense, no acute issues overnight  Objective: Weight change: 0.3 kg (10.6 oz)  Intake/Output Summary (Last 24 hours) at 11/28/14 1449 Last data filed at 11/28/14 1400  Gross per 24 hour  Intake    600 ml  Output   1275 ml  Net   -675 ml   Blood pressure 111/68, pulse 89, temperature 98.6 F (37 C), temperature source Oral, resp. rate 16, height 5\' 3"  (1.6 m), weight 53.4 kg (117 lb 11.6 oz), SpO2 100 %.  Physical Exam: General: Alert and awake, oriented  not in any acute distress, cachectic looking . CVS: S1-S2 clear, no murmur rubs or gallops Chest: clear to auscultation bilaterally, no wheezing, rales or rhonchi Abdomen: soft  , firm, distended, tender, hypoactive bowel sounds  Extremities: no cyanosis, clubbing, + edema noted bilaterally  Lab Results: Basic Metabolic Panel:  Recent Labs Lab 11/27/14 0455 11/28/14 0545  NA 132* 129*  K 2.5* 3.1*  CL 101 98  CO2 24 23  GLUCOSE 80 84  BUN 7 6  CREATININE 0.56 0.47*  CALCIUM 7.9* 7.7*   Liver Function Tests:  Recent Labs Lab 11/27/14 0455 11/28/14 0545  AST 40* 44*  ALT 19 20  ALKPHOS 335* 363*  BILITOT 8.7* 9.5*  PROT 6.7 6.8  ALBUMIN 2.3* 2.0*   No results for input(s): LIPASE, AMYLASE in the last 168 hours. No results for input(s): AMMONIA in the last 168 hours. CBC:  Recent Labs Lab 11/26/14 0753 11/28/14 0545  WBC 8.0 6.2  NEUTROABS 6.6 5.0  HGB 8.5* 8.1*  HCT 25.3* 24.4*  MCV 88.8 90.0  PLT 179 151   Cardiac Enzymes: No results for input(s): CKTOTAL, CKMB, CKMBINDEX, TROPONINI in the last 168 hours. BNP: Invalid input(s): POCBNP CBG: No results for input(s): GLUCAP in the last 168 hours.   Micro Results: No results found for this or any previous visit (from the past 240 hour(s)).  Studies/Results: Dg Abd 1 View  11/25/2014   CLINICAL DATA:  Generalized abdominal pain and distention. Pancreatic carcinoma. Previous Whipple procedure.  EXAM: ABDOMEN - 1 VIEW  COMPARISON:  11/16/2014  FINDINGS: Mild generalized gaseous distention of the colon is seen. Moderate amount stool is noted in the ascending and transverse portions of the colon. No definite evidence of small bowel obstruction. Crowding of bowel loops is seen in the central abdomen suggesting ascites. Surgical clips in stent also seen within the mid abdomen, likely related to previous Whipple procedure.  IMPRESSION: Suspect mild colonic ileus. Moderate colonic stool burden also noted. Probable ascites.   Electronically Signed  By: Earle Gell M.D.   On: 11/25/2014 10:55   Dg Abd 1 View  11/16/2014   CLINICAL DATA:  Abdominal pain and distention.  EXAM: ABDOMEN - 1 VIEW   COMPARISON:  CT on 11/14/2014  FINDINGS: Chronic and bowel loops in the central abdomen is consistent with ascites. Surgical clips and piece of catheter or stent again seen in right upper quadrant. Mild dilatation of small bowel loops and colon is seen in the central abdomen likely due to on adynamic ileus. Some bowel gas in contrast is seen in the rectum.  IMPRESSION: Ascites and probable adynamic ileus.   Electronically Signed   By: Earle Gell M.D.   On: 11/16/2014 16:06   Ct Head Wo Contrast  11/14/2014   CLINICAL DATA:  Altered mental status  EXAM: CT HEAD WITHOUT CONTRAST  TECHNIQUE: Contiguous axial images were obtained from the base of the skull through the vertex without intravenous contrast.  COMPARISON:  October 27, 2005  FINDINGS: The ventricles are normal in size and configuration. There is no mass, hemorrhage, extra-axial fluid collection, or midline shift. Gray-white compartments are normal. No acute infarct apparent. The bony calvarium appears intact. The mastoid air cells are clear.  IMPRESSION: No intracranial mass, hemorrhage, or focal gray -white compartment lesions/acute appearing infarct.   Electronically Signed   By: Lowella Grip III M.D.   On: 11/14/2014 14:17   US Abdomen Complete  11/14/2014   CLINICAL DATA:  Pancreatic cancer post Whipple procedure 08/07/2014, abdominal swelling for 1 week, prior cholecystectomy, hypertension, radiation therapy, smoker  EXAM: ULTRASOUND ABDOMEN COMPLETE  COMPARISON:  CT abdomen and pelvis 10/16/2014  FINDINGS: Gallbladder: Surgically absent  Common bile duct: Diameter: 5 mm diameter, normal  Liver: Heterogeneous in echogenicity. No discrete mass. Portal vein is not well demonstrated ; portal vein demonstrated thrombosis on the prior CT.  IVC: Intrahepatic portion normal appearance. Infrahepatic portion nonvisualized due to bowel gas.  Pancreas: Post Whipple procedure by history. Pancreatic bed inadequately visualized due to bowel gas.  Spleen:  Normal appearance, 5.0 cm length  Right Kidney: Length: 9.3 cm. Normal morphology without mass or hydronephrosis.  Left Kidney: Length: 10.0 cm. Normal morphology without mass or hydronephrosis.  Abdominal aorta: Proximally normal caliber, remainder obscured by bowel gas  Other findings: Significant ascites throughout abdomen and pelvis.  IMPRESSION: Significant ascites significantly increased since prior CT exam, likely cause of abdominal swelling.  Limited exam due to bowel gas, with inadequate visualization of the pancreatic bed, portal vein and portions of the aorta.  Post cholecystectomy.   Electronically Signed   By: Lavonia Dana M.D.   On: 11/14/2014 14:32   Ct Abdomen Pelvis W Contrast  11/14/2014   CLINICAL DATA:  64 year old female with abdominal pain and distension. Current history of pancreatic cancer status post Whipple and radiation. Subsequent encounter.  EXAM: CT ABDOMEN AND PELVIS WITH CONTRAST  TECHNIQUE: Multidetector CT imaging of the abdomen and pelvis was performed using the standard protocol following bolus administration of intravenous contrast.  CONTRAST:  115mL OMNIPAQUE IOHEXOL 300 MG/ML  SOLN  COMPARISON:  10/16/2014 and earlier.  FINDINGS: New moderate size bilateral pleural effusions with lower lobe atelectasis. No pericardial effusion.  Advanced degenerative changes in the lower lumbar spine. No acute osseous abnormality identified.  Increased body wall stranding and density in keeping with a degree of anasarca. Moderate to large volume of ascites in the abdomen has significantly progressed, and in the upper abdomen this is associated with new lobular mass effect  on the liver contour (series 2, image 17).  No dilated large bowel. Widespread small and large bowel wall thickening. Pancreatic or biliary stent re- identified within a small bowel loop in the right upper quadrant which today is dilated with fluid up to 4 cm diameter. There is continued intra and extrahepatic biliary ductal  dilatation. Pancreatic tail atrophy again noted.  In January of the main portal vein was thrombosed. On today images no portal venous system enhancement is identified.  The stomach is thick walled. Oral contrast was administered, and has reached mid small bowel loops.  Major arterial structures in the abdomen and pelvis appear patent. Aortoiliac calcified atherosclerosis noted. Renal enhancement and contrast excretion is within normal limits.  IMPRESSION: 1. Progression of disease. Progressed and now diffuse portal venous system thrombosis since January. 2. Anasarca. Large volume ascites, mass effect on the liver may reflect subcapsular or loculated ascites. Diffuse moderate to severe bowel wall thickening. Moderate bilateral pleural effusions. 3. Fluid-filled and dilated small bowel loop draining the pancreas.   Electronically Signed   By: Genevie Ann M.D.   On: 11/14/2014 14:42   Mr 3d Recon At Scanner  11/27/2014   CLINICAL DATA:  64 year old female admitted to the hospital into 01/09/2015, with altered mental status, abdominal distention, abdominal pain and poor appetite. History of pancreatic cancer complicated by portal vein thrombosis. History of prior Whipple's procedure.  EXAM: MRI ABDOMEN WITHOUT CONTRAST  (INCLUDING MRCP)  TECHNIQUE: Multiplanar multisequence MR imaging of the abdomen was performed. Heavily T2-weighted images of the biliary and pancreatic ducts were obtained, and three-dimensional MRCP images were rendered by post processing.  COMPARISON:  CT of the abdomen and pelvis 11/14/2014.  FINDINGS: Lower chest:  Small right pleural effusion.  Hepatobiliary: There is an regular contour of the liver, which appears to be related to mass effect from adjacent partially loculated ascites, as noted on prior CT of the abdomen and pelvis 11/14/2014. No discrete cystic or solid hepatic lesions are identified. MRCP demonstrates moderate intrahepatic biliary ductal dilatation. The common hepatic duct is also  moderately dilated, measuring up to 13 mm in diameter shortly before the hepaticojejunostomy. The hepaticojejunostomy appears widely patent. As part of the prior Whipple procedure, gallbladder has been surgically removed.  Pancreas: Status post Whipple procedure. The pancreaticojejunostomy is poorly visualized on today's examination. Pancreatic duct is upper limits of normal measuring 3 mm. No discrete pancreatic lesion is noted. Diffuse pancreatic atrophy in the residual body and tail of the pancreas.  Spleen: Unremarkable.  Adrenals/Urinary Tract: Bilateral adrenal glands and bilateral kidneys are normal in appearance. No hydroureteronephrosis in the visualized portions of the abdomen.  Stomach/Bowel: Postoperative changes of Whipple procedure are again noted. As noted on recent prior examinations, the jejunum proximal to the gastrojejunostomy is dilated measuring up to 4 cm in diameter. At the jejunal stump, in the region of previously noted suture line there is a potential mucosal discontinuity, best appreciated coronal delayed post gadolinium T1 weighted sequences (images 66-70 of series 14), suspicious for a tiny breakdown of the suture line, adjacent which there is some extraluminal fluid (best appreciated on image 37 of series 4). The stent previously placed at the pancreaticojejunostomy site continues to reside within the dilated proximal jejunum (i.e., stent has migrated from its proper position).  Vascular/Lymphatic: As noted on prior examinations, the distal aspect of the splenic vein, the superior mesenteric vein, splenoportal confluence and portal vein do not opacify with contrast material, compatible with chronic thrombosis. Hepatic veins remain patent  at this time. Hepatic perfusion appears to be predominantly from the common hepatic artery, as no significant cavernous transformation is identified in the porta hepatis at this time. No definite lymphadenopathy noted in the visualized portions of the  abdomen.  Other: Moderate to large volume of complex ascites, particularly adjacent to the liver where it appears loculated and is exerting mass effect upon the liver.  Musculoskeletal: No aggressive skeletal lesions identified in the visualized portions of the abdomen.  IMPRESSION: 1. Status post Whipple procedure. The hepaticojejunostomy appears widely patent. Moderate intrahepatic biliary ductal dilatation, with dilatation of the common hepatic duct immediately proximal to the hepaticojejunostomy site measuring up to 13 mm in diameter. This is likely a reflection of persistent proximal jejunal dilatation, which has been present on several prior examinations, rather than an indication of frank biliary obstruction. 2. There are findings concerning for potential suture breakdown at the stump of the proximal aspect of the jejunal limb, with a small amount of extraluminal fluid adjacent to this suture line, as above. This is difficult to evaluate on MRI secondary to intrinsic signal void at sites of suture line, however, loss of mucosal continuity is observed on post-contrast sequences such that the possibility of a suture line dehiscence is of concern. Clinical correlation is recommended. 3. Moderate to large volume of complex ascites, which appears partially loculated adjacent to the liver exerting mass effect upon the liver. 4. No definite enhancing peritoneal lesions identified at this time to strongly suggest presence of intraperitoneal spread of malignancy. 5. 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 6. Additional findings, as above. These results will be called to the ordering clinician or representative by the Radiologist Assistant, and communication documented in the PACS or zVision Dashboard.   Electronically Signed   By: Vinnie Langton M.D.   On: 11/27/2014 08:41   US Abdomen Limited  11/16/2014   CLINICAL DATA:  Abdominal distention question ascites, history Whipple  procedure, hypertension, pancreatic cancer  EXAM: LIMITED ABDOMEN ULTRASOUND FOR ASCITES  TECHNIQUE: Limited ultrasound survey for ascites was performed in all four abdominal quadrants.  COMPARISON:  11/14/2014 CT abdomen and pelvis  FINDINGS: Significant ascites is identified in both lower quadrants.  Smaller amount of ascites is seen in the LEFT upper quadrant, with even less in the perihepatic region.  IMPRESSION: Significant ascites.   Electronically Signed   By: Lavonia Dana M.D.   On: 11/16/2014 17:34   US Paracentesis  11/26/2014   INDICATION: History of pancreatic cancer s/p Whipple, portal HTN, recurrent ascites and request for paracentesis.  EXAM: ULTRASOUND-GUIDED PARACENTESIS  COMPARISON:  11/20/14 paracentesis.  MEDICATIONS: None.  COMPLICATIONS: None immediate  TECHNIQUE: Informed written consent was obtained from the patient after a discussion of the risks, benefits and alternatives to treatment. A timeout was performed prior to the initiation of the procedure.  Initial ultrasound scanning demonstrates a small to moderate amount of ascites within the left lower abdominal quadrant. The left lower abdomen was prepped and draped in the usual sterile fashion. 1% lidocaine was used for local anesthesia. Under direct ultrasound guidance, a 19 gauge, 7-cm, Yueh catheter was introduced. An ultrasound image was saved for documentation purposed. The paracentesis was performed. The catheter was removed and a dressing was applied. The patient tolerated the procedure well without immediate post procedural complication.  FINDINGS: A total of approximately 2.5 liters of serous fluid was removed.  IMPRESSION: Successful ultrasound-guided paracentesis yielding 2.5 liters of peritoneal fluid.  Read By:  Levan Hurst  Lilia Pro PA-C   Electronically Signed   By: Sandi Mariscal M.D.   On: 11/26/2014 11:05   US Paracentesis  11/20/2014   INDICATION: Pancreatic cancer, hepatitis-C, ascites. Request is made for diagnostic and  therapeutic paracentesis.  EXAM: ULTRASOUND-GUIDED DIAGNOSTIC AND THERAPEUTIC PARACENTESIS  COMPARISON:  Prior paracentesis on 11/17/2014  MEDICATIONS: None.  COMPLICATIONS: None immediate  TECHNIQUE: Informed written consent was obtained from the patient after a discussion of the risks, benefits and alternatives to treatment. A timeout was performed prior to the initiation of the procedure.  Initial ultrasound scanning demonstrates a small amount of ascites within the right lower abdominal quadrant. The right lower abdomen was prepped and draped in the usual sterile fashion. 1% lidocaine was used for local anesthesia. Under direct ultrasound guidance, a 19 gauge, 7-cm, Yueh catheter was introduced. An ultrasound image was saved for documentation purposed. The paracentesis was performed. The catheter was removed and a dressing was applied. The patient tolerated the procedure well without immediate post procedural complication.  FINDINGS: A total of approximately 1.7 liters of clear, yellow fluid was removed. Samples were sent to the laboratory as requested by the clinical team.  IMPRESSION: Successful ultrasound-guided diagnostic and therapeutic paracentesis yielding 1.7 liters of peritoneal fluid.  Read by: Rowe Robert, PA-C   Electronically Signed   By: Jerilynn Mages.  Shick M.D.   On: 11/20/2014 10:53   US Paracentesis  11/17/2014   INDICATION: ascites  CLINICAL DATA:  Request for therapeutic paracentesis  EXAM: ULTRASOUND-GUIDED PARACENTESIS  ULTRASOUND GUIDED RLQ PARACENTESIS  COMPARISON:  None.  MEDICATIONS: 10 cc 1% lidocaine  COMPLICATIONS: None immediate  TECHNIQUE: Informed written consent was obtained from the patient after a discussion of the risks, benefits and alternatives to treatment. A timeout was performed prior to the initiation of the procedure.  Initial ultrasound scanning demonstrates a large amount of ascites within the right lower abdominal quadrant. The right lower abdomen was prepped and draped in  the usual sterile fashion. 1% lidocaine with epinephrine was used for local anesthesia. Under direct ultrasound guidance, a 19 gauge, 7-cm, Yueh catheter was introduced. An ultrasound image was saved for documentation purposed. the paracentesis was performed. The catheter was removed and a dressing was applied. The patient tolerated the procedure well without immediate post procedural complication.  FINDINGS: A total of approximately 3 liters of serous fluid was removed.  IMPRESSION: Successful ultrasound-guided paracentesis yielding 3 liters of peritoneal fluid.  PROCEDURE: Ultrasound was performed to localize and mark an adequate pocket of fluid in the right quadrant of the abdomen. The area was then prepped and draped in the normal sterile fashion. 1% Lidocaine was used for local anesthesia. Under ultrasound guidance a 19 gauge Yueh catheter was introduced. Paracentesis was performed. The catheter was removed and a dressing applied.  Read by:  Lavonia Drafts [facility]   Electronically Signed   By: Jacqulynn Cadet M.D.   On: 11/17/2014 11:31   US Paracentesis  11/15/2014   CLINICAL DATA:  Ascites, history of pancreatic cancer. Request therapeutic and diagnostic paracentesis of up to 3 L max.  EXAM: ULTRASOUND GUIDED PARACENTESIS  COMPARISON:  None.  PROCEDURE: An ultrasound guided paracentesis was thoroughly discussed with the patient and questions answered. The benefits, risks, alternatives and complications were also discussed. The patient understands and wishes to proceed with the procedure. Written consent was obtained.  Ultrasound was performed to localize and mark an adequate pocket of fluid in the left lower quadrant of the abdomen. The area was then prepped  and draped in the normal sterile fashion. 1% Lidocaine was used for local anesthesia. Under ultrasound guidance a 19 gauge Yueh catheter was introduced. Paracentesis was performed. The catheter was removed and a dressing applied.  COMPLICATIONS: None  immediate  FINDINGS: A total of approximately 3 L of clear yellow fluid was removed. A fluid sample was sent for laboratory analysis.  IMPRESSION: Successful ultrasound guided paracentesis yielding 3 L of ascites.  Read by: Ascencion Dike PA-C   Electronically Signed   By: Corrie Mckusick D.O.   On: 11/15/2014 12:55   Mr Lambert Mody Cm/mrcp  11/27/2014   CLINICAL DATA:  64 year old female admitted to the hospital into 01/09/2015, with altered mental status, abdominal distention, abdominal pain and poor appetite. History of pancreatic cancer complicated by portal vein thrombosis. History of prior Whipple's procedure.  EXAM: MRI ABDOMEN WITHOUT CONTRAST  (INCLUDING MRCP)  TECHNIQUE: Multiplanar multisequence MR imaging of the abdomen was performed. Heavily T2-weighted images of the biliary and pancreatic ducts were obtained, and three-dimensional MRCP images were rendered by post processing.  COMPARISON:  CT of the abdomen and pelvis 11/14/2014.  FINDINGS: Lower chest:  Small right pleural effusion.  Hepatobiliary: There is an regular contour of the liver, which appears to be related to mass effect from adjacent partially loculated ascites, as noted on prior CT of the abdomen and pelvis 11/14/2014. No discrete cystic or solid hepatic lesions are identified. MRCP demonstrates moderate intrahepatic biliary ductal dilatation. The common hepatic duct is also moderately dilated, measuring up to 13 mm in diameter shortly before the hepaticojejunostomy. The hepaticojejunostomy appears widely patent. As part of the prior Whipple procedure, gallbladder has been surgically removed.  Pancreas: Status post Whipple procedure. The pancreaticojejunostomy is poorly visualized on today's examination. Pancreatic duct is upper limits of normal measuring 3 mm. No discrete pancreatic lesion is noted. Diffuse pancreatic atrophy in the residual body and tail of the pancreas.  Spleen: Unremarkable.  Adrenals/Urinary Tract: Bilateral adrenal glands  and bilateral kidneys are normal in appearance. No hydroureteronephrosis in the visualized portions of the abdomen.  Stomach/Bowel: Postoperative changes of Whipple procedure are again noted. As noted on recent prior examinations, the jejunum proximal to the gastrojejunostomy is dilated measuring up to 4 cm in diameter. At the jejunal stump, in the region of previously noted suture line there is a potential mucosal discontinuity, best appreciated coronal delayed post gadolinium T1 weighted sequences (images 66-70 of series 14), suspicious for a tiny breakdown of the suture line, adjacent which there is some extraluminal fluid (best appreciated on image 37 of series 4). The stent previously placed at the pancreaticojejunostomy site continues to reside within the dilated proximal jejunum (i.e., stent has migrated from its proper position).  Vascular/Lymphatic: As noted on prior examinations, the distal aspect of the splenic vein, the superior mesenteric vein, splenoportal confluence and portal vein do not opacify with contrast material, compatible with chronic thrombosis. Hepatic veins remain patent at this time. Hepatic perfusion appears to be predominantly from the common hepatic artery, as no significant cavernous transformation is identified in the porta hepatis at this time. No definite lymphadenopathy noted in the visualized portions of the abdomen.  Other: Moderate to large volume of complex ascites, particularly adjacent to the liver where it appears loculated and is exerting mass effect upon the liver.  Musculoskeletal: No aggressive skeletal lesions identified in the visualized portions of the abdomen.  IMPRESSION: 1. Status post Whipple procedure. The hepaticojejunostomy appears widely patent. Moderate intrahepatic biliary ductal dilatation, with dilatation  of the common hepatic duct immediately proximal to the hepaticojejunostomy site measuring up to 13 mm in diameter. This is likely a reflection of  persistent proximal jejunal dilatation, which has been present on several prior examinations, rather than an indication of frank biliary obstruction. 2. There are findings concerning for potential suture breakdown at the stump of the proximal aspect of the jejunal limb, with a small amount of extraluminal fluid adjacent to this suture line, as above. This is difficult to evaluate on MRI secondary to intrinsic signal void at sites of suture line, however, loss of mucosal continuity is observed on post-contrast sequences such that the possibility of a suture line dehiscence is of concern. Clinical correlation is recommended. 3. Moderate to large volume of complex ascites, which appears partially loculated adjacent to the liver exerting mass effect upon the liver. 4. No definite enhancing peritoneal lesions identified at this time to strongly suggest presence of intraperitoneal spread of malignancy. 5. 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 6. Additional findings, as above. These results will be called to the ordering clinician or representative by the Radiologist Assistant, and communication documented in the PACS or zVision Dashboard.   Electronically Signed   By: Vinnie Langton M.D.   On: 11/27/2014 08:41    Medications: Scheduled Meds: . alum & mag hydroxide-simeth  15 mL Oral Once  . antiseptic oral rinse  7 mL Mouth Rinse q12n4p  . chlorhexidine  15 mL Mouth Rinse BID  . feeding supplement (RESOURCE BREEZE)  1 Container Oral TID BM  . lactulose  20 g Oral BID  . OxyCODONE  15 mg Oral Q12H  . pantoprazole  40 mg Oral Daily  . polyethylene glycol  17 g Oral BID  . potassium chloride  40 mEq Oral BID WC  . sodium chloride  3 mL Intravenous Q12H    time spent 30 minutes      LOS: 14 days   Darryle Dennie M.D. [facility] 11/28/2014, 2:49 PM Pager: [phone]  If 7PM-7AM, please contact night-coverage [email or web address] Password [email or web address]

## 2014-11-28 NOTE — Progress Notes (Signed)
Progress Note from the Palliative Medicine Team at [facility]:   -pateint is is much weaker today, again having conversation regarding her goals of care, treatment  options and anticipatory care needs  -at this time patient does not want a tunneled cath for ascites build-up but is open to further taps in the future as needed to enhance comfort, I recommend paracentesis prior to dc home to enhance comfort  -she wants to go home and focus on comfort, she is open to hospice services on discharge  -continue current medical treatment plan through dc  Objective: No Known Allergies Scheduled Meds: . alum & mag hydroxide-simeth  15 mL Oral Once  . antiseptic oral rinse  7 mL Mouth Rinse q12n4p  . chlorhexidine  15 mL Mouth Rinse BID  . feeding supplement (RESOURCE BREEZE)  1 Container Oral TID BM  . lactulose  20 g Oral BID  . OxyCODONE  15 mg Oral Q12H  . pantoprazole  40 mg Oral Daily  . polyethylene glycol  17 g Oral BID  . potassium chloride  40 mEq Oral BID WC  . sodium chloride  3 mL Intravenous Q12H   Continuous Infusions:   PRN Meds:.acetaminophen, albuterol, bisacodyl, hydrALAZINE, morphine injection, ondansetron (ZOFRAN) IV, oxyCODONE, sodium chloride  BP 108/60 mmHg  Pulse 86  Temp(Src) 98.1 F (36.7 C) (Oral)  Resp 18  Ht 5\' 3"  (1.6 m)  Wt 53.4 kg (117 lb 11.6 oz)  BMI 20.86 kg/m2  SpO2 100%   PPS:30 % at best     Intake/Output Summary (Last 24 hours) at 11/28/14 0858 Last data filed at 11/28/14 0558  Gross per 24 hour  Intake    480 ml  Output   2500 ml  Net  -2020 ml       Physical Exam:  General: chronically ill appearing  NAD, weak, frail and cachetic  HEENT:  Moist buccal  Membranes, no exudate Chest:   Decreased in bases CVS: RRR Abdomen: distended, firm and tender Ext: BLE +2 edema  Labs: CBC    Component Value Date/Time   WBC 6.2 11/28/2014 0545   WBC 4.2 06/22/2014 0935   RBC 2.71* 11/28/2014 0545   RBC 4.37 06/22/2014 0935    HGB 8.1* 11/28/2014 0545   HGB 13.7 06/22/2014 0935   HCT 24.4* 11/28/2014 0545   HCT 40.1 06/22/2014 0935   PLT 151 11/28/2014 0545   PLT 168 06/22/2014 0935   MCV 90.0 11/28/2014 0545   MCV 91.8 06/22/2014 0935   MCH 29.9 11/28/2014 0545   MCH 31.4 06/22/2014 0935   MCHC 33.2 11/28/2014 0545   MCHC 34.2 06/22/2014 0935   RDW 23.3* 11/28/2014 0545   RDW 15.2* 06/22/2014 0935   LYMPHSABS 0.5* 11/28/2014 0545   LYMPHSABS 0.5* 06/22/2014 0935   MONOABS 0.6 11/28/2014 0545   MONOABS 0.4 06/22/2014 0935   EOSABS 0.1 11/28/2014 0545   EOSABS 0.1 06/22/2014 0935   BASOSABS 0.0 11/28/2014 0545   BASOSABS 0.0 06/22/2014 0935    BMET    Component Value Date/Time   NA 129* 11/28/2014 0545   NA 141 06/22/2014 0935   K 3.1* 11/28/2014 0545   K 4.1 06/22/2014 0935   CL 98 11/28/2014 0545   CO2 23 11/28/2014 0545   CO2 22 06/22/2014 0935   GLUCOSE 84 11/28/2014 0545   GLUCOSE 79 06/22/2014 0935   BUN 6 11/28/2014 0545   BUN 10.7 06/22/2014 0935   CREATININE 0.47* 11/28/2014 0545   CREATININE  0.9 06/22/2014 0935   CALCIUM 7.7* 11/28/2014 0545   CALCIUM 9.6 06/22/2014 0935   GFRNONAA >90 11/28/2014 0545   GFRAA >90 11/28/2014 0545    CMP     Component Value Date/Time   NA 129* 11/28/2014 0545   NA 141 06/22/2014 0935   K 3.1* 11/28/2014 0545   K 4.1 06/22/2014 0935   CL 98 11/28/2014 0545   CO2 23 11/28/2014 0545   CO2 22 06/22/2014 0935   GLUCOSE 84 11/28/2014 0545   GLUCOSE 79 06/22/2014 0935   BUN 6 11/28/2014 0545   BUN 10.7 06/22/2014 0935   CREATININE 0.47* 11/28/2014 0545   CREATININE 0.9 06/22/2014 0935   CALCIUM 7.7* 11/28/2014 0545   CALCIUM 9.6 06/22/2014 0935   PROT 6.8 11/28/2014 0545   PROT 8.7* 06/08/2014 1013   ALBUMIN 2.0* 11/28/2014 0545   ALBUMIN 3.4* 06/08/2014 1013   AST 44* 11/28/2014 0545   AST 24 06/08/2014 1013   ALT 20 11/28/2014 0545   ALT 15 06/08/2014 1013   ALKPHOS 363* 11/28/2014 0545   ALKPHOS 89 06/08/2014 1013   BILITOT  9.5* 11/28/2014 0545   BILITOT 0.47 06/08/2014 1013   GFRNONAA >90 11/28/2014 0545   GFRAA >90 11/28/2014 0545      Assessment and Plan: Code Status: DNR/DNI    1. Symptom Control: Pain Oxycontin Er 15 mg every 12 hr  Oxycodone IR 5 mg every 3 hrs prn  2. Psycho/Social:  Emotional support offered to patient at bedside. Educated patient  to continue to verbalize her values and goals of care and how it relates to her treatment options, encouraged her and her daughter to focus on patient centered care.    3. Spiritual  Chaplain involved  5    Disposition:  Discharge home when medically stable, with hospice benefit, will write for choice.    Time In Time Out Total Time Spent with Patient Total Overall Time  0800 0835 35 min 35 min    Greater than 50%  of this time was spent counseling and coordinating care related to the above assessment and plan.  Wadie Lessen NP  Palliative Medicine Team Team Phone # [phone] Pager [phone]  Discussed with Dr Tana Coast 1

## 2014-11-28 NOTE — Progress Notes (Signed)
Patient ID: Melinda Conrad, female   DOB: Nov 25, 1950, 64 y.o.   MRN: [id number]    Subjective: Pt sleeping.    Objective: Vital signs in last 24 hours: Temp:  [98 F (36.7 C)-98.2 F (36.8 C)] 98.1 F (36.7 C) (03/09 0553) Pulse Rate:  [77-86] 86 (03/09 0553) Resp:  [18] 18 (03/09 0553) BP: (88-108)/(54-64) 108/60 mmHg (03/09 0553) SpO2:  [100 %] 100 % (03/09 0553) Weight:  [117 lb 11.6 oz (53.4 kg)] 117 lb 11.6 oz (53.4 kg) (03/09 0242) Last BM Date: 11/22/14  Intake/Output from previous day: 03/08 0701 - 03/09 0700 In: 480 [P.O.:480] Out: 2500 [Urine:2500] Intake/Output this shift: Total I/O In: 120 [P.O.:120] Out: 25 [Urine:25]  General appearance: sleeping Chest wall: breathing comfortably GI: soft, mildly distended, non tender ascites  Lab Results:   Recent Labs  11/26/14 0753 11/28/14 0545  WBC 8.0 6.2  HGB 8.5* 8.1*  HCT 25.3* 24.4*  PLT 179 151   BMET  Recent Labs  11/27/14 0455 11/28/14 0545  NA 132* 129*  K 2.5* 3.1*  CL 101 98  CO2 24 23  GLUCOSE 80 84  BUN 7 6  CREATININE 0.56 0.47*  CALCIUM 7.9* 7.7*   PT/INR  Recent Labs  11/28/14 0545  LABPROT 17.1*  INR 1.38   ABG No results for input(s): PHART, HCO3 in the last 72 hours.  Invalid input(s): PCO2, PO2  Studies/Results: Mr 3d Recon At Scanner  11/27/2014   CLINICAL DATA:  65 year old female admitted to the hospital into 01/09/2015, with altered mental status, abdominal distention, abdominal pain and poor appetite. History of pancreatic cancer complicated by portal vein thrombosis. History of prior Whipple's procedure.  EXAM: MRI ABDOMEN WITHOUT CONTRAST  (INCLUDING MRCP)  TECHNIQUE: Multiplanar multisequence MR imaging of the abdomen was performed. Heavily T2-weighted images of the biliary and pancreatic ducts were obtained, and three-dimensional MRCP images were rendered by post processing.  COMPARISON:  CT of the abdomen and pelvis 11/14/2014.  FINDINGS: Lower chest:  Small right  pleural effusion.  Hepatobiliary: There is an regular contour of the liver, which appears to be related to mass effect from adjacent partially loculated ascites, as noted on prior CT of the abdomen and pelvis 11/14/2014. No discrete cystic or solid hepatic lesions are identified. MRCP demonstrates moderate intrahepatic biliary ductal dilatation. The common hepatic duct is also moderately dilated, measuring up to 13 mm in diameter shortly before the hepaticojejunostomy. The hepaticojejunostomy appears widely patent. As part of the prior Whipple procedure, gallbladder has been surgically removed.  Pancreas: Status post Whipple procedure. The pancreaticojejunostomy is poorly visualized on today's examination. Pancreatic duct is upper limits of normal measuring 3 mm. No discrete pancreatic lesion is noted. Diffuse pancreatic atrophy in the residual body and tail of the pancreas.  Spleen: Unremarkable.  Adrenals/Urinary Tract: Bilateral adrenal glands and bilateral kidneys are normal in appearance. No hydroureteronephrosis in the visualized portions of the abdomen.  Stomach/Bowel: Postoperative changes of Whipple procedure are again noted. As noted on recent prior examinations, the jejunum proximal to the gastrojejunostomy is dilated measuring up to 4 cm in diameter. At the jejunal stump, in the region of previously noted suture line there is a potential mucosal discontinuity, best appreciated coronal delayed post gadolinium T1 weighted sequences (images 66-70 of series 14), suspicious for a tiny breakdown of the suture line, adjacent which there is some extraluminal fluid (best appreciated on image 37 of series 4). The stent previously placed at the pancreaticojejunostomy site continues to reside within  the dilated proximal jejunum (i.e., stent has migrated from its proper position).  Vascular/Lymphatic: As noted on prior examinations, the distal aspect of the splenic vein, the superior mesenteric vein, splenoportal  confluence and portal vein do not opacify with contrast material, compatible with chronic thrombosis. Hepatic veins remain patent at this time. Hepatic perfusion appears to be predominantly from the common hepatic artery, as no significant cavernous transformation is identified in the porta hepatis at this time. No definite lymphadenopathy noted in the visualized portions of the abdomen.  Other: Moderate to large volume of complex ascites, particularly adjacent to the liver where it appears loculated and is exerting mass effect upon the liver.  Musculoskeletal: No aggressive skeletal lesions identified in the visualized portions of the abdomen.  IMPRESSION: 1. Status post Whipple procedure. The hepaticojejunostomy appears widely patent. Moderate intrahepatic biliary ductal dilatation, with dilatation of the common hepatic duct immediately proximal to the hepaticojejunostomy site measuring up to 13 mm in diameter. This is likely a reflection of persistent proximal jejunal dilatation, which has been present on several prior examinations, rather than an indication of frank biliary obstruction. 2. There are findings concerning for potential suture breakdown at the stump of the proximal aspect of the jejunal limb, with a small amount of extraluminal fluid adjacent to this suture line, as above. This is difficult to evaluate on MRI secondary to intrinsic signal void at sites of suture line, however, loss of mucosal continuity is observed on post-contrast sequences such that the possibility of a suture line dehiscence is of concern. Clinical correlation is recommended. 3. Moderate to large volume of complex ascites, which appears partially loculated adjacent to the liver exerting mass effect upon the liver. 4. No definite enhancing peritoneal lesions identified at this time to strongly suggest presence of intraperitoneal spread of malignancy. 5. 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 6.  Additional findings, as above. These results will be called to the ordering clinician or representative by the Radiologist Assistant, and communication documented in the PACS or zVision Dashboard.   Electronically Signed   By: Vinnie Langton M.D.   On: 11/27/2014 08:41   Mr Lambert Mody Cm/mrcp  11/27/2014   CLINICAL DATA:  64 year old female admitted to the hospital into 01/09/2015, with altered mental status, abdominal distention, abdominal pain and poor appetite. History of pancreatic cancer complicated by portal vein thrombosis. History of prior Whipple's procedure.  EXAM: MRI ABDOMEN WITHOUT CONTRAST  (INCLUDING MRCP)  TECHNIQUE: Multiplanar multisequence MR imaging of the abdomen was performed. Heavily T2-weighted images of the biliary and pancreatic ducts were obtained, and three-dimensional MRCP images were rendered by post processing.  COMPARISON:  CT of the abdomen and pelvis 11/14/2014.  FINDINGS: Lower chest:  Small right pleural effusion.  Hepatobiliary: There is an regular contour of the liver, which appears to be related to mass effect from adjacent partially loculated ascites, as noted on prior CT of the abdomen and pelvis 11/14/2014. No discrete cystic or solid hepatic lesions are identified. MRCP demonstrates moderate intrahepatic biliary ductal dilatation. The common hepatic duct is also moderately dilated, measuring up to 13 mm in diameter shortly before the hepaticojejunostomy. The hepaticojejunostomy appears widely patent. As part of the prior Whipple procedure, gallbladder has been surgically removed.  Pancreas: Status post Whipple procedure. The pancreaticojejunostomy is poorly visualized on today's examination. Pancreatic duct is upper limits of normal measuring 3 mm. No discrete pancreatic lesion is noted. Diffuse pancreatic atrophy in the residual body and tail of the pancreas.  Spleen: Unremarkable.  Adrenals/Urinary Tract: Bilateral adrenal glands and bilateral kidneys are normal in  appearance. No hydroureteronephrosis in the visualized portions of the abdomen.  Stomach/Bowel: Postoperative changes of Whipple procedure are again noted. As noted on recent prior examinations, the jejunum proximal to the gastrojejunostomy is dilated measuring up to 4 cm in diameter. At the jejunal stump, in the region of previously noted suture line there is a potential mucosal discontinuity, best appreciated coronal delayed post gadolinium T1 weighted sequences (images 66-70 of series 14), suspicious for a tiny breakdown of the suture line, adjacent which there is some extraluminal fluid (best appreciated on image 37 of series 4). The stent previously placed at the pancreaticojejunostomy site continues to reside within the dilated proximal jejunum (i.e., stent has migrated from its proper position).  Vascular/Lymphatic: As noted on prior examinations, the distal aspect of the splenic vein, the superior mesenteric vein, splenoportal confluence and portal vein do not opacify with contrast material, compatible with chronic thrombosis. Hepatic veins remain patent at this time. Hepatic perfusion appears to be predominantly from the common hepatic artery, as no significant cavernous transformation is identified in the porta hepatis at this time. No definite lymphadenopathy noted in the visualized portions of the abdomen.  Other: Moderate to large volume of complex ascites, particularly adjacent to the liver where it appears loculated and is exerting mass effect upon the liver.  Musculoskeletal: No aggressive skeletal lesions identified in the visualized portions of the abdomen.  IMPRESSION: 1. Status post Whipple procedure. The hepaticojejunostomy appears widely patent. Moderate intrahepatic biliary ductal dilatation, with dilatation of the common hepatic duct immediately proximal to the hepaticojejunostomy site measuring up to 13 mm in diameter. This is likely a reflection of persistent proximal jejunal dilatation,  which has been present on several prior examinations, rather than an indication of frank biliary obstruction. 2. There are findings concerning for potential suture breakdown at the stump of the proximal aspect of the jejunal limb, with a small amount of extraluminal fluid adjacent to this suture line, as above. This is difficult to evaluate on MRI secondary to intrinsic signal void at sites of suture line, however, loss of mucosal continuity is observed on post-contrast sequences such that the possibility of a suture line dehiscence is of concern. Clinical correlation is recommended. 3. Moderate to large volume of complex ascites, which appears partially loculated adjacent to the liver exerting mass effect upon the liver. 4. No definite enhancing peritoneal lesions identified at this time to strongly suggest presence of intraperitoneal spread of malignancy. 5. 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 6. Additional findings, as above. These results will be called to the ordering clinician or representative by the Radiologist Assistant, and communication documented in the PACS or zVision Dashboard.   Electronically Signed   By: Vinnie Langton M.D.   On: 11/27/2014 08:41    Anti-infectives: Anti-infectives    Start     Dose/Rate Route Frequency Ordered Stop   11/22/14 2200  amoxicillin-clavulanate (AUGMENTIN) 875-125 MG per tablet 1 tablet  Status:  Discontinued     1 tablet Oral Every 12 hours 11/22/14 1859 11/25/14 1800   11/15/14 0000  Ampicillin-Sulbactam (UNASYN) 3 g in sodium chloride 0.9 % 100 mL IVPB  Status:  Discontinued     3 g 100 mL/hr over 60 Minutes Intravenous 4 times per day 11/14/14 1616 11/22/14 1859   11/14/14 1615  Ampicillin-Sulbactam (UNASYN) 3 g in sodium chloride 0.9 % 100 mL IVPB  3 g 100 mL/hr over 60 Minutes Intravenous STAT 11/14/14 1609 11/14/14 1827   11/14/14 1600  ampicillin-sulbactam (UNASYN) 1.5 g in sodium chloride 0.9 % 50 mL IVPB   Status:  Discontinued     1.5 g 100 mL/hr over 30 Minutes Intravenous  Once 11/14/14 1527 11/14/14 1609      Assessment/Plan: s/p * No surgery found * pancreatic cancer, s/p whipple  Remains malnourished with ascites On diuretics. No evidence of biliary obstruction.   Discussed anticoagulation with Dr. Hilarie Fredrickson.  Low likelihood of recanalization of thrombus and resolution of liver disease with anticoagulation, so not worth risk.   Would continue aggressive nutrition.   Do not think patient has any leak of GI contents.  Area in question has staple line that is oversewn and is not part of any anastamosis.  More likely that she has localized ascites in this area as well.   LOS: 14 days    Advit Trethewey 11/28/2014

## 2014-11-28 NOTE — Progress Notes (Signed)
[facility] notified by Case Manager pt/ family want to re-consider home with services of Hospice and [facility]). Pt information reviewed with Dr [facility] Medical Director and eligibility with Dx Liver Disease confirmed. Spoke twice with patient at bedside and daughter Joseph Art via telephone call; they confirm plan is for home tomorrow after draining off some more fluid; they are hopeful pt will be able to be at home, not having to return to the hospital; however dtr and pt stated they would be open to having the fluid removed as an outpatient if this was needed for comfort;  per pt/dtr they are hopeful to d/c by personal vehicle; foley catheter to be left in for comfort. Daughter and pt inform pt's boyfriend helps with pt's care at home and daughter and other children/grandchildren come by to assist. Pt states I just want to go home and be comfortable. Dtr and pt agreeable to [place] contacting PCP Dr Marlou Sa to request collaboration as attending physician working with [facility] once home. Writer will follow up in am as pt/dtr want to consider equipment needs. Danton Sewer RN MSN [facility] Liaison

## 2014-11-29 ENCOUNTER — Inpatient Hospital Stay (HOSPITAL_COMMUNITY): Payer: Medicaid Other

## 2014-11-29 DIAGNOSIS — C259 Malignant neoplasm of pancreas, unspecified: Secondary | ICD-10-CM

## 2014-11-29 LAB — BASIC METABOLIC PANEL
Anion gap: 6 (ref 5–15)
BUN: 7 mg/dL (ref 6–23)
CALCIUM: 8 mg/dL — AB (ref 8.4–10.5)
CO2: 23 mmol/L (ref 19–32)
Chloride: 103 mmol/L (ref 96–112)
Creatinine, Ser: 0.4 mg/dL — ABNORMAL LOW (ref 0.50–1.10)
GFR calc Af Amer: >90 mL/min (ref >90)
GLUCOSE: 76 mg/dL (ref 70–99)
Potassium: 3.6 mmol/L (ref 3.5–5.1)
SODIUM: 132 mmol/L — AB (ref 135–145)

## 2014-11-29 MED ORDER — SPIRONOLACTONE 25 MG PO TABS: 25.0000 mg | ORAL_TABLET | Freq: Every day | ORAL | Status: AC

## 2014-11-29 MED ORDER — SPIRONOLACTONE 50 MG PO TABS
50.0000 mg | ORAL_TABLET | Freq: Every day | ORAL | Status: DC
  Filled 2014-11-29: qty 1

## 2014-11-29 MED ORDER — FUROSEMIDE 20 MG PO TABS
20.0000 mg | ORAL_TABLET | Freq: Every day | ORAL | Status: DC
  Filled 2014-11-29: qty 1

## 2014-11-29 MED ORDER — POTASSIUM CHLORIDE CRYS ER 20 MEQ PO TBCR: 40.0000 meq | EXTENDED_RELEASE_TABLET | Freq: Two times a day (BID) | ORAL | Status: AC

## 2014-11-29 MED ORDER — LACTULOSE 10 GM/15ML PO SOLN: 20.0000 g | Freq: Two times a day (BID) | ORAL | Status: AC

## 2014-11-29 MED ORDER — OXYCODONE HCL 5 MG PO TABS: 5.0000 mg | ORAL_TABLET | ORAL | Status: AC | PRN

## 2014-11-29 MED ORDER — POLYETHYLENE GLYCOL 3350 17 G PO PACK: 17.0000 g | PACK | Freq: Two times a day (BID) | ORAL | Status: AC

## 2014-11-29 MED ORDER — FUROSEMIDE 20 MG PO TABS: 20.0000 mg | ORAL_TABLET | Freq: Every day | ORAL | Status: DC

## 2014-11-29 MED ORDER — FUROSEMIDE 20 MG PO TABS: 20.0000 mg | ORAL_TABLET | Freq: Every day | ORAL | Status: AC

## 2014-11-29 MED ORDER — PANTOPRAZOLE SODIUM 40 MG PO TBEC: 40.0000 mg | DELAYED_RELEASE_TABLET | Freq: Every day | ORAL | Status: AC

## 2014-11-29 MED ORDER — OXYCODONE HCL ER 15 MG PO T12A: 15.0000 mg | EXTENDED_RELEASE_TABLET | Freq: Two times a day (BID) | ORAL | Status: AC

## 2014-11-29 MED ORDER — SPIRONOLACTONE 50 MG PO TABS: 50.0000 mg | ORAL_TABLET | Freq: Every day | ORAL | Status: DC

## 2014-11-29 MED ORDER — ONDANSETRON HCL 4 MG PO TABS: 4.0000 mg | ORAL_TABLET | Freq: Three times a day (TID) | ORAL | Status: AC | PRN

## 2014-11-29 NOTE — Progress Notes (Signed)
Nurse reviewed discharge instructions with pt.  Pt verbalized understanding.  Pt's daughter called and informed that pt was leaving hospital unit.  Hospice also contacted upon pt's discharge.  PICC line removed prior to discharge.  DNR form sent with pt.  Pt transported by [facility] to home.

## 2014-11-29 NOTE — Discharge Summary (Signed)
Physician Discharge Summary  Patient ID: KARSYN JAMIE MRN: [id number] DOB/AGE: 1951-04-22 64 y.o.  Admit date: 11/14/2014 Discharge date: 11/29/2014  Primary Care Physician:  Kevan Ny, MD  Discharge Diagnoses:   Liver failure with portal vein, splenic and SMV thrombosis  . Hyperammonemia . Hepatic encephalopathy . Pancreatic cancer status post Whipple procedure with liver decompensation  . Protein-calorie malnutrition, severe . Portal vein thrombosis . Hypokalemia . Anemia in neoplastic disease . Recurrent bacteremia . Hepatitis C . Ascites/anasarca . Dehydration Comfort care  Consults:  Gastroenterology, Dr Hilarie Fredrickson Gen. Surgery, Dr Barry Dienes Palliative medicine   Recommendations for Outpatient Follow-up:  Patient is not interested in indwelling drain or catheter for ascites. Currently comfort care with hospice to follow at home  Ultrasound-guided paracentesis was done prior to discharge, 2.6 L were removed  Patient started on low-dose spironolactone 25 mg daily and Lasix 20 mg daily, if tolerated with patient's BP. May need to stop if develops significant hyponatremia or acute renal failure. Labs can be checked by hospice nurse.   DIET: Soft diet, comfort    Allergies:  No Known Allergies   Discharge Medications:   Medication List    STOP taking these medications        Ampicillin-Sulbactam 3 g in sodium chloride 0.9 % 100 mL     antiseptic oral rinse 0.05 % Liqd solution  Commonly known as:  CPC / CETYLPYRIDINIUM CHLORIDE 0.05%     fentaNYL 25 MCG/HR patch  Commonly known as:  DURAGESIC - dosed mcg/hr     megestrol 40 MG/ML suspension  Commonly known as:  MEGACE ORAL     oxyCODONE-acetaminophen 5-325 MG per tablet  Commonly known as:  PERCOCET/ROXICET     pregabalin 25 MG capsule  Commonly known as:  LYRICA     prochlorperazine 5 MG tablet  Commonly known as:  COMPAZINE     sucralfate 1 GM/10ML suspension  Commonly known as:  CARAFATE       TAKE these medications        CALCIUM-VITAMIN D PO  Take 1 capsule by mouth daily.     feeding supplement (ENSURE COMPLETE) Liqd  Take 237 mLs by mouth 3 (three) times daily between meals.     furosemide 20 MG tablet  Commonly known as:  LASIX  Take 1 tablet (20 mg total) by mouth daily.     lactulose 10 GM/15ML solution  Commonly known as:  CHRONULAC  Take 30 mLs (20 g total) by mouth 2 (two) times daily.     ondansetron 4 MG tablet  Commonly known as:  ZOFRAN  Take 1 tablet (4 mg total) by mouth every 8 (eight) hours as needed for nausea or vomiting.     oxyCODONE 5 MG immediate release tablet  Commonly known as:  Oxy IR/ROXICODONE  Take 1 tablet (5 mg total) by mouth every 3 (three) hours as needed for moderate pain or severe pain.     OxyCODONE 15 mg T12a 12 hr tablet  Commonly known as:  OXYCONTIN  Take 1 tablet (15 mg total) by mouth every 12 (twelve) hours.     pantoprazole 40 MG tablet  Commonly known as:  PROTONIX  Take 1 tablet (40 mg total) by mouth daily.     polyethylene glycol packet  Commonly known as:  MIRALAX / GLYCOLAX  Take 17 g by mouth 2 (two) times daily at 10 AM and 5 PM.     potassium chloride SA 20 MEQ tablet  Commonly known  as:  K-DUR,KLOR-CON  Take 2 tablets (40 mEq total) by mouth 2 (two) times daily with a meal.     spironolactone 25 MG tablet  Commonly known as:  ALDACTONE  Take 1 tablet (25 mg total) by mouth daily.         Brief H and P: For complete details please refer to admission H and P, but in brief Melinda Conrad is a 64 y.o. female with history of adenocarcinoma of head of pancreas, status post Whipple's procedure 08/07/14, completed concurrent capecitabine and radiation, poor performance status and hence did not get adjuvant chemotherapy, recurrent Klebsiella bacteremia- attributed to intra-abdominal source/presumed septic portal vein thrombus, acute portal vein thrombosis-not on anticoagulation secondary to GI bleed,  tobacco abuse & hepatitis C presented to the [facility] ED on 11/14/14 with complaints of progressive mental status changes, abdominal distention/pain and poor appetite. Patient was unable to provide history due to altered mental status and no family member was at the bedside. Apparently patient was having progressive failure to thrive since Whipple's procedure. However over the last 3 days prior to admission , family have noticed gradually worsening confusion, disorientation and lethargic to a point of near unresponsiveness. She had decreased oral intake of liquids and food but no nausea or vomiting, progressive abdominal and lower extremity swelling with intermittent abdominal pain and no fevers. Patient has been getting IV antibiotics through right upper extremity PICC line and therefore plans to remove PICC line and from IV Unasyn to oral cefuroxime. In the ED, patient wassomnolent but arousable, afebrile, elevated blood pressure, lab work significant for potassium <2, abnormal LFTs, ammonia 120, hemoglobin 8.5, CT head without acute findings, CT abdomen and pelvis: Diffuse portal vein system thrombosis, anasarca, diffuse moderate to severe bowel wall thickening, moderate bilateral pleural effusions and fluid filled and dilated small bowel loop draining the pancreas. Since admission Patient has had paracentesis twice. Patient continued to develop fluid and requires further tapping. gastroenterology, surgery and interventional radiology closely followed the patient through the hospitalization.   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 Gastroenterology, interventional radiology and surgery was consulted during the hospitalization. Patient  has significant ascites and required multiple paracenteses during this hospitalization. Cytology showed atypical cells. MRCP showed a portal vein thrombosis. Per gastroenterology, Dr. Hilarie Fredrickson, " it is very unlikely that therapy would lead to any meaningful recannulization of these abdominal vessels and carries a high risk of bleeding with known anastomosis status post Whipple procedure". Recommended palliative medicine consult for goals of care, overall poor prognosis Palliative medicine was consulted for goals of care who had detailed discussion with the patient and the daughter, patient at this time does not want a tunneled catheter for ascites but is open to palliative/therapeutic paracentesis in future. Hospice consult was placed and will start today after discharge. Patient underwent ultrasound-guided paracentesis prior to discharge for comfort. Diuretics were held during the hospitalization due to hyponatremia and hypokalemia, restarted her Lasix and spironolactone at low-dose, hopefully will help with the ascites. May need to stop diuretics if her BP doesn't allow. Patient will be followed by hospice nurse.  Anasarca/ascites -Secondary to liver disease, portal vein thrombosis and hypoalbuminemia with malnutrition -Patient had ultrasound-guided paracentesis x2, on 2/25, 2/27, 3/7, 3/10 prior to discharge. Patient was empirically started on IV Unasyn 2/24 which  was transitioned on 3/3 to oral Augmentin to cover for possible SBP, she has completed the course of antibiotics. Fluid cytology shows atypical cells--+ 4 Caklretinin and MT-1 consistent with mesothelial cells, cultures negative to date.  Palliative care consulted and appreciated, overall poor prognosis, therapeutic paracentesis done prior to discharge, patient has declined indwelling drain or tunneled catheter. She is open to therapeutic paracentesis for comfort.   Previous Klebsiella and streptococcal bacteremia -ID consulted and appreciated,  completed 42 day course of antibiotics  Severe hypokalemia - Placed on daily supplementation  Dehydration -Secondary to poor oral intake, patient is tolerating diet   Portal vein thrombosis causing portal hypertension leading to liver failure -Patient has had history of GI bleed is currently not anticoagulation candidate, bilirubin rising  Anemia secondary to chronic disease -Baseline approximately 8, 8.1 at discharge  Hepatitis C -Currently not on treatment  Severe protein calorie malnutrition/failure to thrive -Nutrition consulted  Pancreatic adenocarcinoma -S/p Whipple's procedure, appreciate input from Dr. Barry Dienes  Day of Discharge BP 96/66 mmHg  Pulse 92  Temp(Src) 98.8 F (37.1 C) (Oral)  Resp 17  Ht 5\' 3"  (1.6 m)  Wt 53.4 kg (117 lb 11.6 oz)  BMI 20.86 kg/m2  SpO2 100%  Physical Exam: General: Alert and awake oriented x3, cachectic appearing HEENT: anicteric sclera, pupils reactive to light and accommodation CVS: S1-S2 clear no murmur rubs or gallops Chest: clear to auscultation bilaterally, no wheezing rales or rhonchi Abdomen: soft , distended with ascites normal bowel sounds Extremities: no cyanosis, clubbing or edema noted bilaterally    The results of significant diagnostics from this hospitalization (including imaging, microbiology, ancillary and laboratory) are listed below for reference.    LAB RESULTS: Basic Metabolic Panel:  Recent Labs Lab 11/28/14 0545 11/29/14 0620  NA 129* 132*  K 3.1* 3.6  CL 98 103  CO2 23 23  GLUCOSE 84 76  BUN 6 7  CREATININE 0.47* 0.40*  CALCIUM 7.7* 8.0*   Liver Function Tests:  Recent Labs Lab 11/27/14 0455 11/28/14 0545  AST 40* 44*  ALT 19 20  ALKPHOS 335* 363*  BILITOT 8.7* 9.5*  PROT 6.7 6.8  ALBUMIN 2.3* 2.0*   No results for input(s): LIPASE, AMYLASE in the last 168 hours. No results for input(s): AMMONIA in the last 168 hours. CBC:  Recent Labs Lab 11/26/14 0753 11/28/14 0545  WBC 8.0  6.2  NEUTROABS 6.6 5.0  HGB 8.5* 8.1*  HCT 25.3* 24.4*  MCV 88.8 90.0  PLT 179 151   Cardiac Enzymes: No results for input(s): CKTOTAL, CKMB, CKMBINDEX, TROPONINI in the last 168 hours. BNP: Invalid input(s): POCBNP CBG: No results for input(s): GLUCAP in the last 168 hours.  Significant Diagnostic Studies:  Ct Head Wo Contrast  11/14/2014   CLINICAL DATA:  Altered mental status  EXAM: CT HEAD WITHOUT CONTRAST  TECHNIQUE: Contiguous axial images were obtained from the base of the skull through the vertex without intravenous contrast.  COMPARISON:  October 27, 2005  FINDINGS: The ventricles are normal in size and configuration. There is no mass, hemorrhage, extra-axial fluid collection, or midline shift. Gray-white compartments are normal. No acute infarct apparent. The bony calvarium appears intact. The mastoid air cells are clear.  IMPRESSION: No intracranial mass, hemorrhage, or focal gray -white compartment lesions/acute appearing infarct.   Electronically Signed   By: Lowella Grip III M.D.   On: 11/14/2014 14:17   US Abdomen Complete  11/14/2014   CLINICAL DATA:  Pancreatic cancer post Whipple procedure 08/07/2014,  abdominal swelling for 1 week, prior cholecystectomy, hypertension, radiation therapy, smoker  EXAM: ULTRASOUND ABDOMEN COMPLETE  COMPARISON:  CT abdomen and pelvis 10/16/2014  FINDINGS: Gallbladder: Surgically absent  Common bile duct: Diameter: 5 mm diameter, normal  Liver: Heterogeneous in echogenicity. No discrete mass. Portal vein is not well demonstrated ; portal vein demonstrated thrombosis on the prior CT.  IVC: Intrahepatic portion normal appearance. Infrahepatic portion nonvisualized due to bowel gas.  Pancreas: Post Whipple procedure by history. Pancreatic bed inadequately visualized due to bowel gas.  Spleen: Normal appearance, 5.0 cm length  Right Kidney: Length: 9.3 cm. Normal morphology without mass or hydronephrosis.  Left Kidney: Length: 10.0 cm. Normal  morphology without mass or hydronephrosis.  Abdominal aorta: Proximally normal caliber, remainder obscured by bowel gas  Other findings: Significant ascites throughout abdomen and pelvis.  IMPRESSION: Significant ascites significantly increased since prior CT exam, likely cause of abdominal swelling.  Limited exam due to bowel gas, with inadequate visualization of the pancreatic bed, portal vein and portions of the aorta.  Post cholecystectomy.   Electronically Signed   By: Lavonia Dana M.D.   On: 11/14/2014 14:32   Ct Abdomen Pelvis W Contrast  11/14/2014   CLINICAL DATA:  64 year old female with abdominal pain and distension. Current history of pancreatic cancer status post Whipple and radiation. Subsequent encounter.  EXAM: CT ABDOMEN AND PELVIS WITH CONTRAST  TECHNIQUE: Multidetector CT imaging of the abdomen and pelvis was performed using the standard protocol following bolus administration of intravenous contrast.  CONTRAST:  134mL OMNIPAQUE IOHEXOL 300 MG/ML  SOLN  COMPARISON:  10/16/2014 and earlier.  FINDINGS: New moderate size bilateral pleural effusions with lower lobe atelectasis. No pericardial effusion.  Advanced degenerative changes in the lower lumbar spine. No acute osseous abnormality identified.  Increased body wall stranding and density in keeping with a degree of anasarca. Moderate to large volume of ascites in the abdomen has significantly progressed, and in the upper abdomen this is associated with new lobular mass effect on the liver contour (series 2, image 17).  No dilated large bowel. Widespread small and large bowel wall thickening. Pancreatic or biliary stent re- identified within a small bowel loop in the right upper quadrant which today is dilated with fluid up to 4 cm diameter. There is continued intra and extrahepatic biliary ductal dilatation. Pancreatic tail atrophy again noted.  In January of the main portal vein was thrombosed. On today images no portal venous system  enhancement is identified.  The stomach is thick walled. Oral contrast was administered, and has reached mid small bowel loops.  Major arterial structures in the abdomen and pelvis appear patent. Aortoiliac calcified atherosclerosis noted. Renal enhancement and contrast excretion is within normal limits.  IMPRESSION: 1. Progression of disease. Progressed and now diffuse portal venous system thrombosis since January. 2. Anasarca. Large volume ascites, mass effect on the liver may reflect subcapsular or loculated ascites. Diffuse moderate to severe bowel wall thickening. Moderate bilateral pleural effusions. 3. Fluid-filled and dilated small bowel loop draining the pancreas.   Electronically Signed   By: Genevie Ann M.D.   On: 11/14/2014 14:42   US Paracentesis  11/15/2014   CLINICAL DATA:  Ascites, history of pancreatic cancer. Request therapeutic and diagnostic paracentesis of up to 3 L max.  EXAM: ULTRASOUND GUIDED PARACENTESIS  COMPARISON:  None.  PROCEDURE: An ultrasound guided paracentesis was thoroughly discussed with the patient and questions answered. The benefits, risks, alternatives and complications were also discussed. The patient understands and wishes  to proceed with the procedure. Written consent was obtained.  Ultrasound was performed to localize and mark an adequate pocket of fluid in the left lower quadrant of the abdomen. The area was then prepped and draped in the normal sterile fashion. 1% Lidocaine was used for local anesthesia. Under ultrasound guidance a 19 gauge Yueh catheter was introduced. Paracentesis was performed. The catheter was removed and a dressing applied.  COMPLICATIONS: None immediate  FINDINGS: A total of approximately 3 L of clear yellow fluid was removed. A fluid sample was sent for laboratory analysis.  IMPRESSION: Successful ultrasound guided paracentesis yielding 3 L of ascites.  Read by: Ascencion Dike PA-C   Electronically Signed   By: Corrie Mckusick D.O.   On: 11/15/2014  12:55    2D ECHO:   Disposition and Follow-up:    DISPOSITION: Home with home hospice   DISCHARGE FOLLOW-UP Follow-up Information    Follow up with Hospice at [facility].   Specialty:  Hospice and Palliative Medicine   Contact information:   [place] [address] [phone]       Follow up with Marlou Sa, ERIC, MD. Schedule an appointment as soon as possible for a visit in 2 weeks.   Specialty:  Internal Medicine   Why:  for hospital follow-up, obtain labs BMET   Contact information:   [place] [address] [phone]       Follow up with Jerene Bears, MD. Schedule an appointment as soon as possible for a visit in 2 weeks.   Specialty:  Gastroenterology   Why:  for hospital follow-up   Contact information:   [address] [place] [address] [phone]        Time spent on Discharge: 35 minutes  Signed:   Tuan Tippin M.D. [facility] 11/29/2014, 2:55 PM Pager: [phone]

## 2014-11-29 NOTE — Progress Notes (Signed)
     [facility] Progress Note  Subjective:  Pt resting comfortably. Says she plans on going home today after paracentesis.   Objective:  Vital signs in last 24 hours: Temp:  [98.3 F (36.8 C)-98.8 F (37.1 C)] 98.8 F (37.1 C) (03/10 0552) Pulse Rate:  [89-95] 92 (03/10 0552) Resp:  [16-17] 17 (03/10 0552) BP: (106-113)/(68-75) 106/75 mmHg (03/10 0552) SpO2:  [99 %-100 %] 100 % (03/10 0552) Last BM Date: 11/22/14 General: Alert, ill appearing female in NAD Heart: Regular rate and rhythm; no murmurs Pulm;lungs clear Abdomen: Tight, distended, mild diffuse tenderness  Extremities: bilat edema Neurologic: Alert and oriented x4; grossly normal neurologically. Psych: Alert and cooperative. Normal mood and affect.  Intake/Output from previous day: 03/09 0701 - 03/10 0700 In: 240 [P.O.:240] Out: 1225 [Urine:1225] Intake/Output this shift:    Lab Results:  Recent Labs  11/28/14 0545  WBC 6.2  HGB 8.1*  HCT 24.4*  PLT 151   BMET  Recent Labs  11/27/14 0455 11/28/14 0545 11/29/14 0620  NA 132* 129* 132*  K 2.5* 3.1* 3.6  CL 101 98 103  CO2 24 23 23   GLUCOSE 80 84 76  BUN 7 6 7   CREATININE 0.56 0.47* 0.40*  CALCIUM 7.9* 7.7* 8.0*   LFT  Recent Labs  11/28/14 0545  PROT 6.8  ALBUMIN 2.0*  AST 44*  ALT 20  ALKPHOS 363*  BILITOT 9.5*   PT/INR  Recent Labs  11/28/14 0545  LABPROT 17.1*  INR 1.38   Hepatitis Panel No results for input(s): HEPBSAG, HCVAB, HEPAIGM, HEPBIGM in the last 72 hours.  No results found.  ASSESSMENT/PLAN:   64 yo female with pancreatic CA s/p whipple with liver decompensation --PV, splenic and SMV clot. Pt hopes to go home with intermittent paracentesis for comfort. She is not interested in indwelling drain.     LOS: 15 days   Koren Plyler, Deloris Ping 11/29/2014, Pager [phone]

## 2014-11-29 NOTE — Progress Notes (Signed)
Spoke with patient and daughter about discharge plan and DME needs. Patient and family in agreement that patient will discharge today after paracentesis by [facility]AR. Patient requested the following DME; Gilford Rile, BSC, Shower chair, and wheelchair. [employer] equipment manager contacted, she will place order with [facility]. Per discussion Daughter Joseph Art aware that [facility] representative will contact her to arrange a time for DME delivery. Patient and daughter aware [facility] admission RN will be out to the home around 5-6 pm tonight to fully admit patient to [facility] services. Above discussed with staff RN Denton Ar, per discussion with patient foley to be left in at discharge. Thank you Ardath Sax RN BSN [facility]  Please call Referral Center with any questions [phone]

## 2014-11-29 NOTE — Procedures (Signed)
Ultrasound-guided therapeutic paracentesis performed yielding 2.6 L yellow fluid. No immediate complications.

## 2015-02-20 DEATH — deceased

## 2015-07-30 ENCOUNTER — Other Ambulatory Visit: Payer: Self-pay | Admitting: Internal Medicine

## 2015-07-30 ENCOUNTER — Other Ambulatory Visit: Payer: Medicaid Other

## 2015-07-30 DIAGNOSIS — R52 Pain, unspecified: Secondary | ICD-10-CM

## 2015-07-30 DIAGNOSIS — R609 Edema, unspecified: Secondary | ICD-10-CM

## 2016-10-07 ENCOUNTER — Other Ambulatory Visit: Payer: Self-pay | Admitting: Nurse Practitioner

## 2016-10-20 IMAGING — CT CT ABD-PELV W/ CM
1 of 3 series · 13 of 32 positions shown, 18 images · IV contrast (omnipaque)
Comparison: 09/26/2014

CLINICAL DATA: Fever, lower pelvic pain, history of pancreatic
cancer status post Whipple

EXAM:
CT ABDOMEN AND PELVIS WITH CONTRAST
TECHNIQUE: Multidetector CT imaging of the abdomen and pelvis was performed
using the standard protocol following bolus administration of
intravenous contrast.
CONTRAST:  100mL OMNIPAQUE IOHEXOL 300 MG/ML  SOLN

[Series 2: abd/pel with · axial · 0.74mm/px · z∈[-671,-286]mm · 13 of 87 slices shown, 18 images]
[im 5/87  soft-tissue]
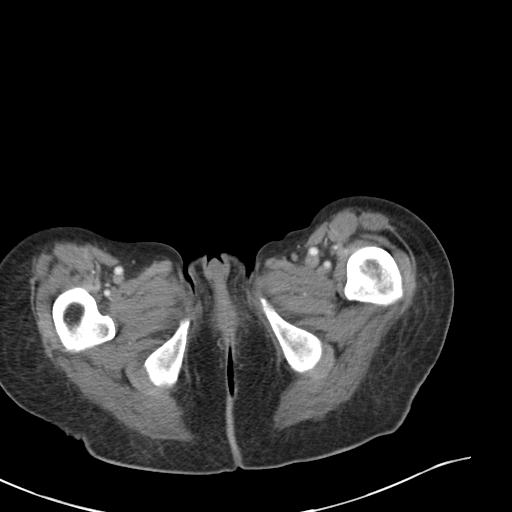
[im 5/87  bone]
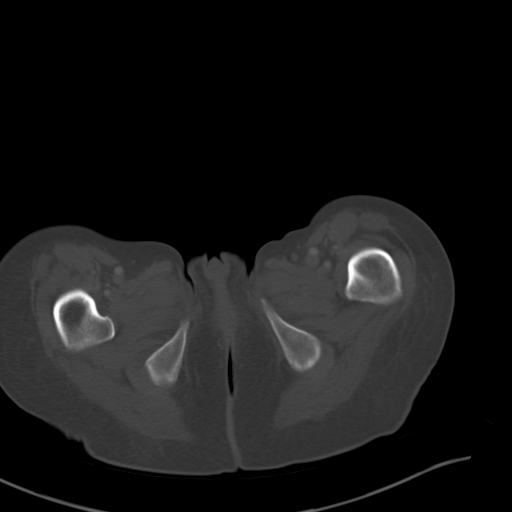
[im 14/87  soft-tissue]
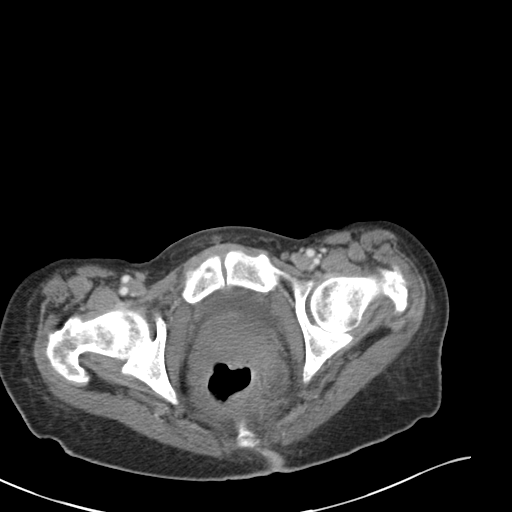
[im 19/87  soft-tissue]
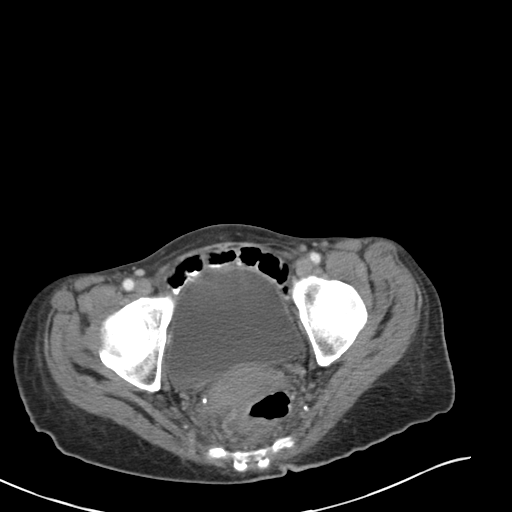
[im 28/87  soft-tissue]
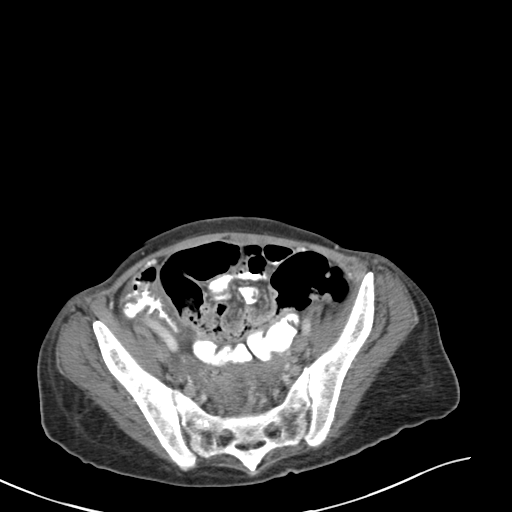
[im 32/87  soft-tissue]
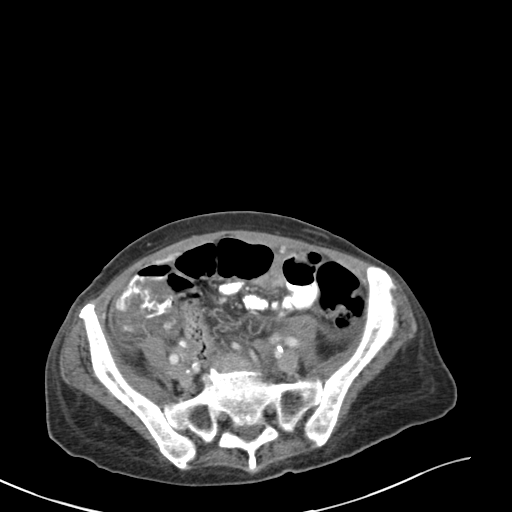
[im 41/87  soft-tissue]
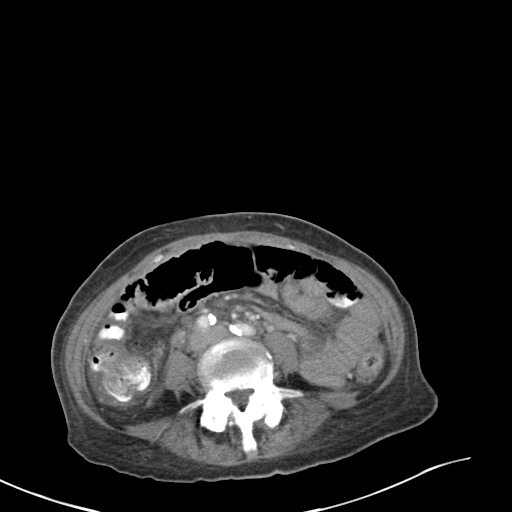
[im 46/87  soft-tissue]
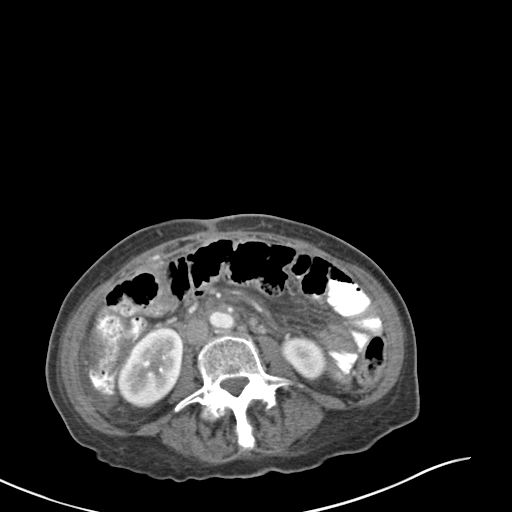
[im 55/87  soft-tissue]
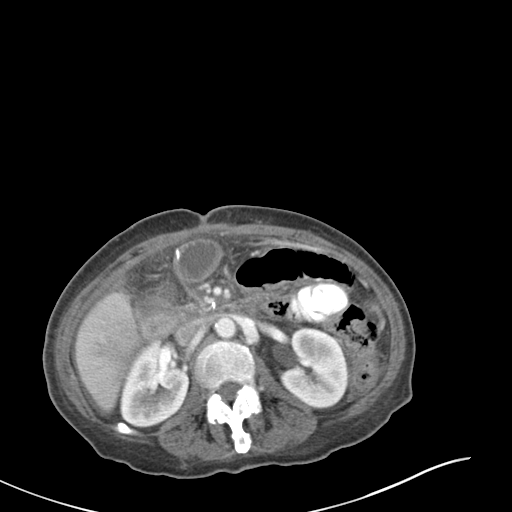
[im 59/87  soft-tissue]
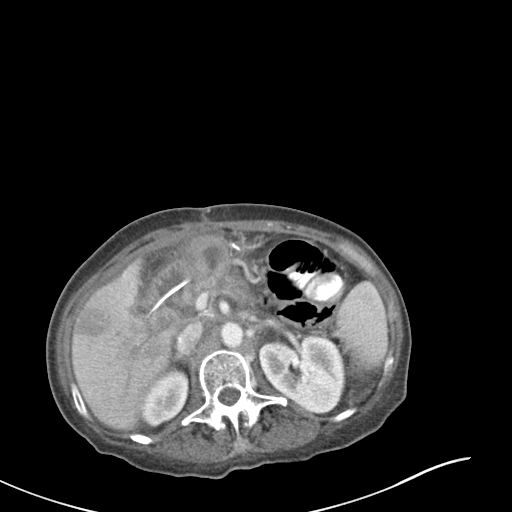
[im 59/87  bone]
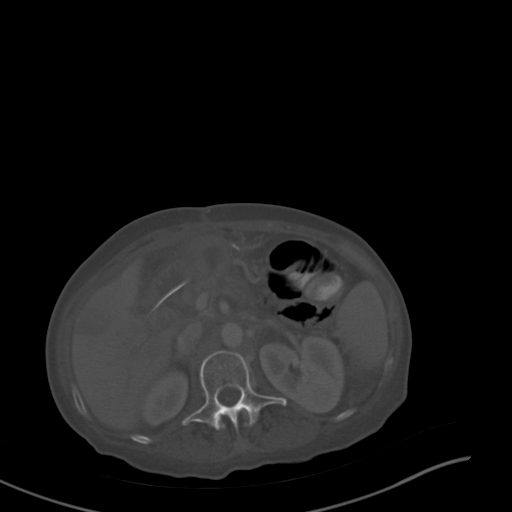
[im 68/87  soft-tissue]
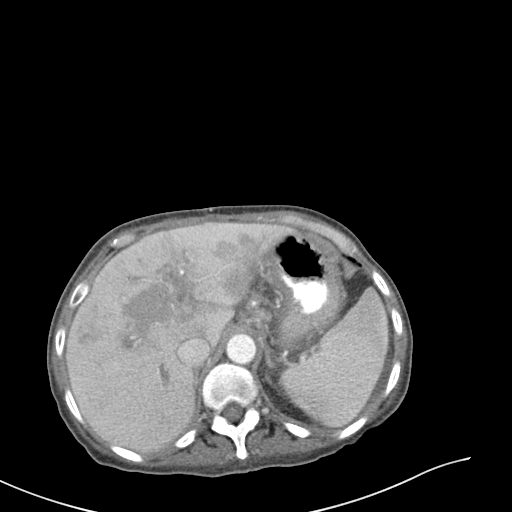
[im 68/87  lung]
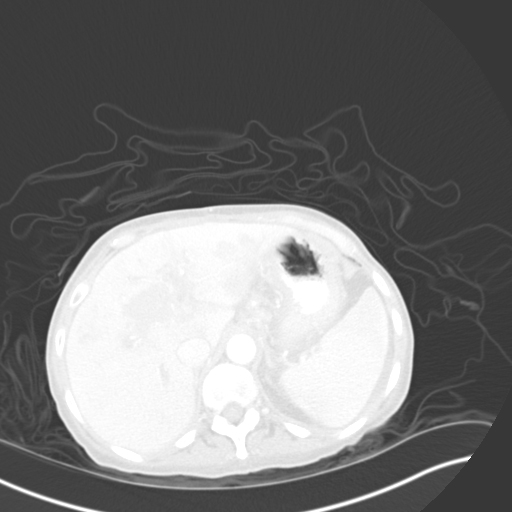
[im 73/87  soft-tissue]
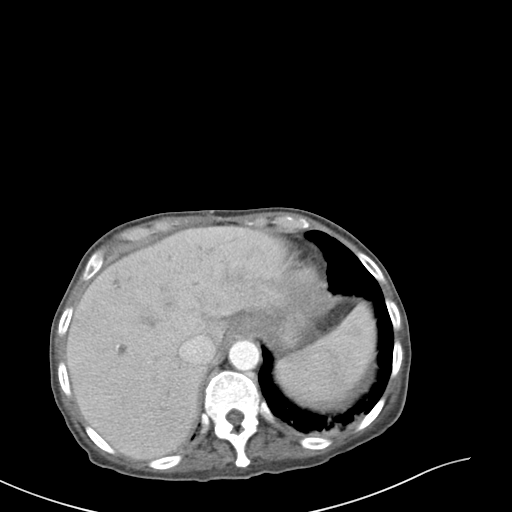
[im 73/87  lung]
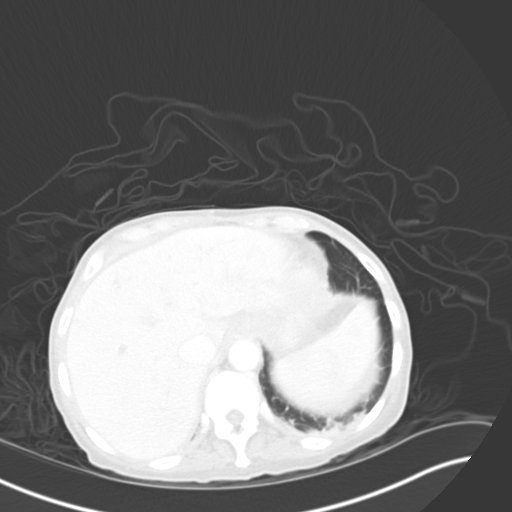
[im 77/87  lung]
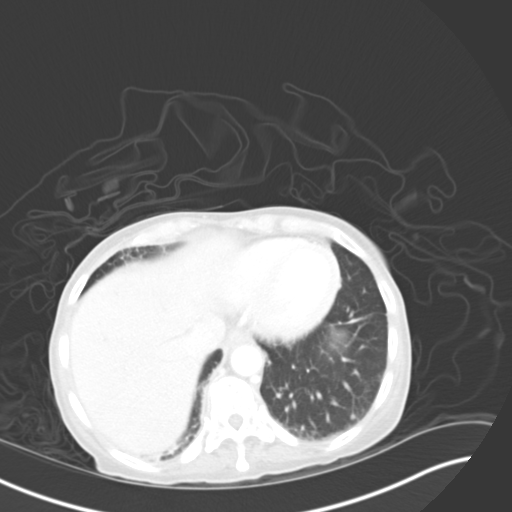
[im 82/87  soft-tissue]
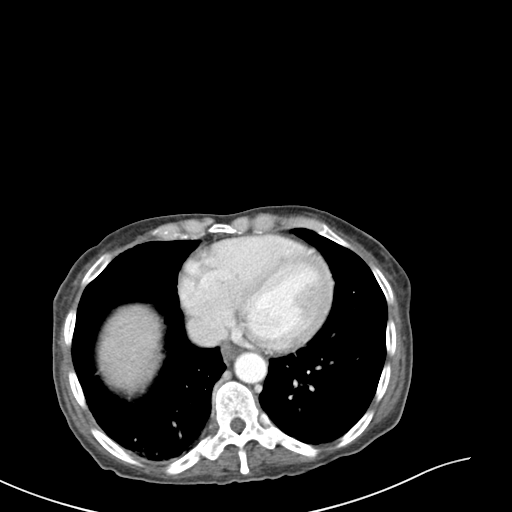
[im 82/87  lung]
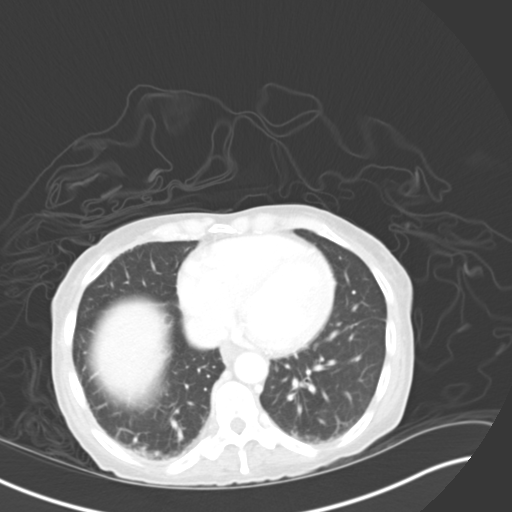

[13 of 32 positions shown; findings below may reference images not displayed]

FINDINGS: Postsurgical changes related to Whipple procedure.

Lower chest:  Mild dependent atelectasis at the lung bases.

Hepatobiliary: Heterogeneous perfusion of the liver on the arterial
phase, although this improves on the delayed phase.

Status post cholecystectomy. No intrahepatic or extrahepatic ductal
dilatation.

Pancreas: Partial pancreatectomy with atrophy of the residual
pancreatic tail. Prior pancreatic stent is now located entirely
within small bowel (series 2/image 26).

Spleen: Within normal limits.

Adrenals/Urinary Tract: Adrenal glands are unremarkable.

Kidneys are within normal limits.  No hydronephrosis.

Bladder is unremarkable.

Stomach/Bowel: Partial gastrectomy.

Patent gastrojejunostomy.

No evidence of bowel obstruction.

Right colon is mildly thick-walled (series 2/image 50), nonspecific,
but raising the possibility of infectious/inflammatory colitis.
Rectosigmoid colon is mildly thick-walled but underdistended.

Vascular/Lymphatic: Atherosclerotic calcifications of the abdominal
aorta and branch vessels.

Thrombosis of the main portal vein (series 2/ image 25).

Soft tissue/nodularity along the hepatoduodenal ligament (series 2/
image 21), likely reflecting radiation/postsurgical changes,
although a discrete 11 mm gastrohepatic node is possible.

Reproductive: Uterus and bilateral ovaries are grossly unremarkable.

Other: Small volume pelvic ascites.

Musculoskeletal: Degenerative changes of the visualized
thoracolumbar spine, most prominent at L5-S1.
IMPRESSION: Postsurgical changes related to Whipple procedure, as above. Please
note that the prior pancreatic stent has now migrated into the small
bowel.

Portal vein thrombosis. Suspected associated heterogeneous perfusion
of the liver.

Soft tissue/nodularity along the hepatoduodenal ligament is favored
to reflect radiation/postsurgical changes, although a discrete 11 mm
gastrohepatic node is not excluded.

Mild wall thickening of the right colon, possibly reflecting
infectious/inflammatory colitis.

## 2018-12-20 ENCOUNTER — Encounter: Payer: Self-pay | Admitting: Internal Medicine
